# Patient Record
Sex: Male | Born: 1960
Health system: Southern US, Community
[De-identification: ages and names within clinical notes are randomized; demographics above are authoritative.]

## PROBLEM LIST (undated history)

## (undated) DIAGNOSIS — I509 Heart failure, unspecified: Secondary | ICD-10-CM

## (undated) DIAGNOSIS — N400 Enlarged prostate without lower urinary tract symptoms: Secondary | ICD-10-CM

## (undated) DIAGNOSIS — I639 Cerebral infarction, unspecified: Secondary | ICD-10-CM

## (undated) DIAGNOSIS — H3340 Traction detachment of retina, unspecified eye: Secondary | ICD-10-CM

## (undated) DIAGNOSIS — C649 Malignant neoplasm of unspecified kidney, except renal pelvis: Secondary | ICD-10-CM

## (undated) DIAGNOSIS — N184 Chronic kidney disease, stage 4 (severe): Secondary | ICD-10-CM

## (undated) DIAGNOSIS — M810 Age-related osteoporosis without current pathological fracture: Secondary | ICD-10-CM

## (undated) DIAGNOSIS — E119 Type 2 diabetes mellitus without complications: Secondary | ICD-10-CM

## (undated) DIAGNOSIS — G629 Polyneuropathy, unspecified: Secondary | ICD-10-CM

## (undated) DIAGNOSIS — I2510 Atherosclerotic heart disease of native coronary artery without angina pectoris: Secondary | ICD-10-CM

## (undated) DIAGNOSIS — G459 Transient cerebral ischemic attack, unspecified: Secondary | ICD-10-CM

## (undated) DIAGNOSIS — I272 Pulmonary hypertension, unspecified: Secondary | ICD-10-CM

## (undated) DIAGNOSIS — H547 Unspecified visual loss: Secondary | ICD-10-CM

## (undated) DIAGNOSIS — N186 End stage renal disease: Secondary | ICD-10-CM

## (undated) DIAGNOSIS — E785 Hyperlipidemia, unspecified: Secondary | ICD-10-CM

## (undated) DIAGNOSIS — I10 Essential (primary) hypertension: Secondary | ICD-10-CM

## (undated) DIAGNOSIS — D649 Anemia, unspecified: Secondary | ICD-10-CM

## (undated) HISTORY — PX: NEPHRECTOMY RADICAL: SUR878

## (undated) HISTORY — PX: HIP SURGERY: SHX245

## (undated) HISTORY — PX: FRACTURE SURGERY: SHX138

## (undated) HISTORY — PX: AV FISTULA PLACEMENT: SHX1204

## (undated) HISTORY — PX: EYE SURGERY: SHX253

## (undated) HISTORY — PX: CARDIAC CATHETERIZATION: SHX172

---

## 2011-05-28 DIAGNOSIS — M8080XA Other osteoporosis with current pathological fracture, unspecified site, initial encounter for fracture: Secondary | ICD-10-CM | POA: Insufficient documentation

## 2011-07-03 DIAGNOSIS — E1165 Type 2 diabetes mellitus with hyperglycemia: Secondary | ICD-10-CM | POA: Insufficient documentation

## 2011-07-03 DIAGNOSIS — E1129 Type 2 diabetes mellitus with other diabetic kidney complication: Secondary | ICD-10-CM | POA: Insufficient documentation

## 2011-11-30 DIAGNOSIS — E559 Vitamin D deficiency, unspecified: Secondary | ICD-10-CM | POA: Insufficient documentation

## 2012-01-27 DIAGNOSIS — E1142 Type 2 diabetes mellitus with diabetic polyneuropathy: Secondary | ICD-10-CM | POA: Insufficient documentation

## 2012-02-19 DIAGNOSIS — S72001A Fracture of unspecified part of neck of right femur, initial encounter for closed fracture: Secondary | ICD-10-CM | POA: Insufficient documentation

## 2012-07-21 DIAGNOSIS — H4313 Vitreous hemorrhage, bilateral: Secondary | ICD-10-CM | POA: Insufficient documentation

## 2012-09-22 DIAGNOSIS — H3340 Traction detachment of retina, unspecified eye: Secondary | ICD-10-CM | POA: Insufficient documentation

## 2012-10-06 DIAGNOSIS — H1830 Unspecified corneal membrane change: Secondary | ICD-10-CM | POA: Insufficient documentation

## 2012-10-27 ENCOUNTER — Inpatient Hospital Stay: Payer: Self-pay | Admitting: Internal Medicine

## 2012-10-27 LAB — BASIC METABOLIC PANEL
Anion Gap: 9 (ref 7–16)
BUN: 33 mg/dL — ABNORMAL HIGH (ref 7–18)
Chloride: 110 mmol/L — ABNORMAL HIGH (ref 98–107)
Co2: 23 mmol/L (ref 21–32)
Creatinine: 2.04 mg/dL — ABNORMAL HIGH (ref 0.60–1.30)
EGFR (African American): 43 — ABNORMAL LOW
Potassium: 4.9 mmol/L (ref 3.5–5.1)
Sodium: 142 mmol/L (ref 136–145)

## 2012-10-27 LAB — URINALYSIS, COMPLETE
Hyaline Cast: 14
Ketone: NEGATIVE
Nitrite: NEGATIVE
Ph: 5 (ref 4.5–8.0)
Protein: >=500
RBC,UR: 6 /HPF (ref 0–5)

## 2012-10-27 LAB — CBC
HCT: 30.6 % — ABNORMAL LOW (ref 40.0–52.0)
HGB: 10.4 g/dL — ABNORMAL LOW (ref 13.0–18.0)
MCH: 30 pg (ref 26.0–34.0)
MCHC: 34.1 g/dL (ref 32.0–36.0)
MCV: 88 fL (ref 80–100)
RDW: 13.1 % (ref 11.5–14.5)

## 2012-10-27 LAB — CLOSTRIDIUM DIFFICILE BY PCR

## 2012-10-28 LAB — CBC WITH DIFFERENTIAL/PLATELET
Basophil #: 0.1 10*3/uL (ref 0.0–0.1)
Basophil %: 0.8 %
Eosinophil #: 0.3 10*3/uL (ref 0.0–0.7)
HCT: 23.2 % — ABNORMAL LOW (ref 40.0–52.0)
Lymphocyte #: 2.7 10*3/uL (ref 1.0–3.6)
MCH: 29.8 pg (ref 26.0–34.0)
MCHC: 34.1 g/dL (ref 32.0–36.0)
MCV: 87 fL (ref 80–100)
Monocyte #: 0.6 x10 3/mm (ref 0.2–1.0)
Monocyte %: 7.7 %
Platelet: 208 10*3/uL (ref 150–440)
RBC: 2.66 10*6/uL — ABNORMAL LOW (ref 4.40–5.90)
RDW: 12.9 % (ref 11.5–14.5)

## 2012-10-28 LAB — COMPREHENSIVE METABOLIC PANEL
Albumin: 2.1 g/dL — ABNORMAL LOW (ref 3.4–5.0)
Anion Gap: 8 (ref 7–16)
BUN: 27 mg/dL — ABNORMAL HIGH (ref 7–18)
Chloride: 111 mmol/L — ABNORMAL HIGH (ref 98–107)
EGFR (African American): >60
EGFR (Non-African Amer.): 53 — ABNORMAL LOW
Glucose: 151 mg/dL — ABNORMAL HIGH (ref 65–99)
Potassium: 5.2 mmol/L — ABNORMAL HIGH (ref 3.5–5.1)
SGOT(AST): 20 U/L (ref 15–37)
SGPT (ALT): 19 U/L (ref 12–78)
Total Protein: 4.7 g/dL — ABNORMAL LOW (ref 6.4–8.2)

## 2013-01-16 DIAGNOSIS — E11311 Type 2 diabetes mellitus with unspecified diabetic retinopathy with macular edema: Secondary | ICD-10-CM | POA: Insufficient documentation

## 2013-02-28 DIAGNOSIS — R600 Localized edema: Secondary | ICD-10-CM | POA: Insufficient documentation

## 2013-03-13 DIAGNOSIS — I255 Ischemic cardiomyopathy: Secondary | ICD-10-CM | POA: Insufficient documentation

## 2013-03-13 DIAGNOSIS — I272 Pulmonary hypertension, unspecified: Secondary | ICD-10-CM | POA: Insufficient documentation

## 2013-03-13 DIAGNOSIS — I5022 Chronic systolic (congestive) heart failure: Secondary | ICD-10-CM | POA: Insufficient documentation

## 2013-03-27 DIAGNOSIS — K551 Chronic vascular disorders of intestine: Secondary | ICD-10-CM | POA: Insufficient documentation

## 2013-07-21 DIAGNOSIS — D509 Iron deficiency anemia, unspecified: Secondary | ICD-10-CM | POA: Insufficient documentation

## 2013-08-15 DIAGNOSIS — Z85528 Personal history of other malignant neoplasm of kidney: Secondary | ICD-10-CM | POA: Insufficient documentation

## 2013-09-28 DIAGNOSIS — E1139 Type 2 diabetes mellitus with other diabetic ophthalmic complication: Secondary | ICD-10-CM | POA: Insufficient documentation

## 2013-09-28 DIAGNOSIS — E113599 Type 2 diabetes mellitus with proliferative diabetic retinopathy without macular edema, unspecified eye: Secondary | ICD-10-CM | POA: Insufficient documentation

## 2014-06-25 DIAGNOSIS — I6381 Other cerebral infarction due to occlusion or stenosis of small artery: Secondary | ICD-10-CM | POA: Insufficient documentation

## 2014-06-25 DIAGNOSIS — I951 Orthostatic hypotension: Secondary | ICD-10-CM | POA: Insufficient documentation

## 2014-08-25 DIAGNOSIS — I639 Cerebral infarction, unspecified: Secondary | ICD-10-CM | POA: Insufficient documentation

## 2014-12-03 DIAGNOSIS — N184 Chronic kidney disease, stage 4 (severe): Secondary | ICD-10-CM | POA: Insufficient documentation

## 2014-12-04 DIAGNOSIS — D631 Anemia in chronic kidney disease: Secondary | ICD-10-CM | POA: Insufficient documentation

## 2015-03-26 DIAGNOSIS — I160 Hypertensive urgency: Secondary | ICD-10-CM | POA: Insufficient documentation

## 2015-04-09 NOTE — H&P (Signed)
PATIENT NAME:  Richard Zavala, Richard Zavala MR#:  [id number] DATE OF BIRTH:  1961/01/19  DATE OF ADMISSION:  10/27/2012  PRIMARY DOCTOR: [facility]   ER PHYSICIAN: Dr. Lenise Arena   CHIEF COMPLAINT: Diarrhea.   HISTORY OF PRESENT ILLNESS: The patient is a 54 year old male with hypertension and diabetes who was found on the floor by his son this morning. The patient was brought in because of possible syncope. The patient was evaluated by the ER physician and we were asked to admit for acute renal failure with diarrhea. The patient was seen at the bedside, complains of diarrhea since yesterday but afternoon about 10 to 12 loose stools. The patient denies any recent travel history or recent sick contacts. The patient did have some vomiting yesterday afternoon but none after that, mainly loose stools, unable to keep anything down, and has no abdominal pain. The patient denies any fever. The patient felt very weak this morning.   PAST MEDICAL HISTORY:  1. Hypertension.  2. Hyperlipidemia.  3. Diabetes. 4. The patient probably also has peripheral vascular disease.   ALLERGIES: He is allergic to penicillin.   SOCIAL HISTORY: Previous smoker, quit two months ago. Occasional alcohol. No drugs. The patient lives with his son.  PAST SURGICAL HISTORY:  1. Surgery for the right hip. 2. History of right hand surgery. 3. Recent retinal detachment surgery two weeks ago.   FAMILY HISTORY: No hypertension or diabetes.   MEDICATIONS:  1. Aspirin 81 mg daily. 2. Chlorthalidone 25 mg p.o. daily.  3. Plavix 75 mg daily. 4. Flexeril 10 mg p.o. t.i.d.  5. Januvia 100 mg p.o. daily.  6. Lisinopril 5 mg daily.  7. Metformin 750 mg p.o. daily.  8. Niaspan 500 mg at bedtime.  9. Pravastatin 40 mg daily.  10. Promethazine every six hours as needed for nausea.  11. Vitamin B 100 mg p.o. daily.   REVIEW OF SYSTEMS: CONSTITUTIONAL: Feels weak. EYES: Has right eye blurred vision because of recent  surgery. ENT: No tinnitus. No epistaxis. No difficulty swallowing. RESPIRATORY: Has no cough. CARDIOVASCULAR: No chest pain. No palpitations. GI: Had nausea and diarrhea. No abdominal pain. The patient denies any recent history of change in bowel habits. No hemorrhoids. GU: No dysuria. ENDOCRINE: No polyuria or nocturia. The patient does have diabetes but no recent problems. INTEGUMENTARY: No skin rashes. MUSCULOSKELETAL: No joint pain. NEUROLOGIC: The patient denies any dysarthria or headache. PSYCHIATRIC: No anxiety or insomnia.   PHYSICAL EXAMINATION:   VITAL SIGNS: Temperature 97.7, pulse 90, respirations 18, blood pressure 143/78, sats 99% on room air.   GENERAL: He is an alert, awake, oriented, well nourished male answering questions appropriately.   HEENT: Head atraumatic, normocephalic.   EYES:  PERLA ,EOM intact  intact. The patient does have right eye congestion. He says his vision is improving.   ENT: No tympanic membrane congestion. Turbinates show slight right hypertrophy both sides. No oropharyngeal erythema.   NECK: Normal range of motion. No meningeal signs. No JVD. No lymphadenopathy.   CARDIOVASCULAR: S1, S2 regular. No murmurs. PMI not displaced.   LUNGS: Clear to auscultation. Not using accessory muscles of respiration. No wheezing.   ABDOMEN: Soft. Bowel sounds present. No organomegaly. No tenderness. No hernias.   EXTREMITIES: No extremity edema. No cyanosis. No clubbing.   NEUROLOGIC: Alert, awake, oriented. Cranial nerves II through XII intact. Power 5/5 in upper and lower extremities. Sensations are intact. DTRs 2+ bilaterally.   SKIN: Warm and dry.   PSYCH: Mood and  affect are within normal limits.   LAB DATA: Electrolytes sodium 142, potassium 4.9, chloride 110, bicarb 23, BUN 33, creatinine 2, glucose 222, WBC 18.5, hemoglobin 10.4, hematocrit 30.6, platelets 332. Troponin less than 0.02.  Abdominal x-ray shows no evidence of ileus or obstruction.  CAT  scan of the head is done for altered mental status which showed chronic involutional changes without evidence of acute abnormality. Has ganglial finding representing sequela of areas of lacunar infarct.   No previous labs are available for comparison.   EKG shows normal sinus with no ST-T changes.   ASSESSMENT AND PLAN:  1. The patient is 54 year old male with syncope. The patient's syncope is likely due to dehydration from diarrhea and acute renal failure. CT head showed old stroke. The patient is going to be admitted for acute gastroenteritis, most likely viral. Stool for C. diff is already sent. Will follow the stool cultures. Continue IV fluids. White count is elevated but has no evidence of fever and abdomen is not tender so we will hold off on antibiotics at this time, just continue fluids. If the patient does have any abdominal pain or fever, will get CAT scan at that time and then will start antibiotics. Follow the stool cultures.  2. Acute renal failure likely due to dehydration. Continue fluids. Check BMP in the morning.  3. Syncope likely due to diarrhea and acute renal failure. CAT scan showed old strokes. We will obtain MRI of the brain because of his risk factors.  4. Hypertension. Blood pressure is stable. The patient received 2 liters of fluid in the Emergency Room. Continue IV at 100 mL/h. Hold blood pressure medications at this time because blood pressure is borderline. Hold chlorthalidone and lisinopril because of acute renal failure. 5. Diabetes mellitus, type II. Continue sliding scale with coverage. Start on full liquids. Hold metformin due to renal failure.   6. History of peripheral vascular disease. He is on aspirin, Plavix, and statins. Continue them along with Niaspan.   TIME SPENT ON HISTORY AND PHYSICAL: About 55 minutes.   Discussed the plan with the son.   ____________________________ Epifanio Lesches, MD sk:drc D: 10/27/2012 12:31:36 ET T: 10/27/2012 12:48:16  ET JOB#: [id number]  cc: Epifanio Lesches, MD, <Dictator> [place] MD ELECTRONICALLY SIGNED 12/06/2012 14:52

## 2015-04-09 NOTE — Discharge Summary (Signed)
PATIENT NAME:  Richard Zavala, Richard Zavala MR#:  [id number] DATE OF BIRTH:  1961-05-25  DATE OF ADMISSION:  10/27/2012 DATE OF DISCHARGE:  10/28/2012  DISCHARGE DIAGNOSES:  1. Syncope secondary to dehydration.  2. Acute viral gastroenteritis.  3. Acute renal failure due to dehydration, resolving.  4. Hypertension.  5. Diabetes mellitus.  6. Transient ischemic attacks.  7. History of diabetic retinopathy.   DISCHARGE MEDICATIONS:  1. Aspirin 81 mg daily.  2. Plavix 75 mg p.o. daily.  3. Flexeril 10 mg p.o. t.i.d. as needed.  4. Januvia 100 mg daily.  5. Lisinopril 5 mg daily. 6. Niaspan 500 mg at bedtime. 7. Pravastatin 40 mg daily. 8. Promethazine 25 mg p.r.n. for nausea.  9. Vitamin B 100 mg daily.   DO NOT TAKE: The patient is advised to stop metformin today and tomorrow and can be restarted on Sunday. That is on hold because of recovery from renal failure. Advised to stop chlorthalidone.  DISCHARGE VITALS: Temperature 97.1, pulse 78, respirations 20, blood pressure 171/79, saturation 100% on room air.   The patient is supposed to get morning medication.   CONSULTATIONS: None.   HOSPITAL COURSE: The patient is a 54 year old male with hypertension, diabetes, and history of TIA's who came in because of syncope. The patient had diarrhea the day before and was admitted for syncope with acute renal failure. The patient's CT of the head did not show any acute changes. The patient says that he had MRA of the brain at [place] this year in June along with carotid ultrasound and was told that he had mini strokes and also had some carotid stenosis so he is on aspirin and Plavix for that. The patient was started on IV fluids for dehydration. His BUN and creatinine on admission was BUN 33 and creatinine 2. The patient's white count also was elevated at 18.5 but he had no fever so he was not started on antibiotics. He was started on IV fluids. Stool for C. difficile has been negative. The patient did not have  any diarrhea since last night and is able to tolerate the diet. No abdominal pain. No vomiting. The patient's creatinine this morning is 1.52. The patient's white count also has normalized to 7.5. CT head as I mentioned showed chronic changes without evidence of acute abnormalities. The patient's abdominal x-ray showed no evidence of ileus or obstruction. The patient has no evidence of CHF. The patient's condition is stable today. He will be going home after evaluation with physical therapy. The patient uses a cane or walker at home. The patient was advised to continue his diabetic medication except the metformin. His hemoglobin A1c is slightly elevated at 8.3. The patient needs to see his primary doctor at [facility] regarding his diabetes management. Hemoglobin is slightly low at 7.9 but it could be hemodilutional. Denies any diarrhea or blood in the stool.   TIME SPENT ON DISCHARGE PREPARATION: More than 30 minutes.   ____________________________ Epifanio Lesches, MD sk:drc D: 10/28/2012 10:04:10 ET T: 10/29/2012 12:22:38 ET JOB#: [id number]  cc: Epifanio Lesches, MD, <Dictator> Epifanio Lesches MD ELECTRONICALLY SIGNED 11/15/2012 11:56

## 2015-08-14 ENCOUNTER — Inpatient Hospital Stay: Payer: Medicaid Other

## 2015-08-14 ENCOUNTER — Emergency Department: Payer: Medicaid Other

## 2015-08-14 ENCOUNTER — Inpatient Hospital Stay
Admission: EM | Admit: 2015-08-14 | Discharge: 2015-08-17 | DRG: 682 | Disposition: A | Discharge status: Home / Self Care | Payer: Medicaid Other | Attending: Internal Medicine | Admitting: Internal Medicine

## 2015-08-14 ENCOUNTER — Encounter: Payer: Self-pay | Admitting: *Deleted

## 2015-08-14 DIAGNOSIS — N401 Enlarged prostate with lower urinary tract symptoms: Secondary | ICD-10-CM | POA: Diagnosis present

## 2015-08-14 DIAGNOSIS — H540 Blindness, both eyes: Secondary | ICD-10-CM | POA: Diagnosis present

## 2015-08-14 DIAGNOSIS — E1122 Type 2 diabetes mellitus with diabetic chronic kidney disease: Secondary | ICD-10-CM | POA: Diagnosis present

## 2015-08-14 DIAGNOSIS — I5042 Chronic combined systolic (congestive) and diastolic (congestive) heart failure: Secondary | ICD-10-CM | POA: Diagnosis present

## 2015-08-14 DIAGNOSIS — Z8249 Family history of ischemic heart disease and other diseases of the circulatory system: Secondary | ICD-10-CM | POA: Diagnosis not present

## 2015-08-14 DIAGNOSIS — Z825 Family history of asthma and other chronic lower respiratory diseases: Secondary | ICD-10-CM | POA: Diagnosis not present

## 2015-08-14 DIAGNOSIS — R001 Bradycardia, unspecified: Secondary | ICD-10-CM | POA: Diagnosis present

## 2015-08-14 DIAGNOSIS — R0789 Other chest pain: Secondary | ICD-10-CM | POA: Insufficient documentation

## 2015-08-14 DIAGNOSIS — Z880 Allergy status to penicillin: Secondary | ICD-10-CM | POA: Diagnosis not present

## 2015-08-14 DIAGNOSIS — Z85528 Personal history of other malignant neoplasm of kidney: Secondary | ICD-10-CM

## 2015-08-14 DIAGNOSIS — Z992 Dependence on renal dialysis: Secondary | ICD-10-CM | POA: Diagnosis not present

## 2015-08-14 DIAGNOSIS — E11319 Type 2 diabetes mellitus with unspecified diabetic retinopathy without macular edema: Secondary | ICD-10-CM | POA: Diagnosis present

## 2015-08-14 DIAGNOSIS — R197 Diarrhea, unspecified: Secondary | ICD-10-CM | POA: Diagnosis present

## 2015-08-14 DIAGNOSIS — I2510 Atherosclerotic heart disease of native coronary artery without angina pectoris: Secondary | ICD-10-CM | POA: Diagnosis present

## 2015-08-14 DIAGNOSIS — D631 Anemia in chronic kidney disease: Secondary | ICD-10-CM | POA: Diagnosis present

## 2015-08-14 DIAGNOSIS — N2581 Secondary hyperparathyroidism of renal origin: Secondary | ICD-10-CM | POA: Diagnosis present

## 2015-08-14 DIAGNOSIS — M810 Age-related osteoporosis without current pathological fracture: Secondary | ICD-10-CM | POA: Diagnosis present

## 2015-08-14 DIAGNOSIS — Z7982 Long term (current) use of aspirin: Secondary | ICD-10-CM

## 2015-08-14 DIAGNOSIS — R778 Other specified abnormalities of plasma proteins: Secondary | ICD-10-CM | POA: Insufficient documentation

## 2015-08-14 DIAGNOSIS — E785 Hyperlipidemia, unspecified: Secondary | ICD-10-CM | POA: Diagnosis present

## 2015-08-14 DIAGNOSIS — R7989 Other specified abnormal findings of blood chemistry: Secondary | ICD-10-CM | POA: Insufficient documentation

## 2015-08-14 DIAGNOSIS — D6489 Other specified anemias: Secondary | ICD-10-CM | POA: Insufficient documentation

## 2015-08-14 DIAGNOSIS — E875 Hyperkalemia: Secondary | ICD-10-CM | POA: Diagnosis present

## 2015-08-14 DIAGNOSIS — Z794 Long term (current) use of insulin: Secondary | ICD-10-CM

## 2015-08-14 DIAGNOSIS — Z79899 Other long term (current) drug therapy: Secondary | ICD-10-CM

## 2015-08-14 DIAGNOSIS — Z9889 Other specified postprocedural states: Secondary | ICD-10-CM | POA: Diagnosis not present

## 2015-08-14 DIAGNOSIS — E1140 Type 2 diabetes mellitus with diabetic neuropathy, unspecified: Secondary | ICD-10-CM | POA: Diagnosis present

## 2015-08-14 DIAGNOSIS — I120 Hypertensive chronic kidney disease with stage 5 chronic kidney disease or end stage renal disease: Secondary | ICD-10-CM | POA: Diagnosis present

## 2015-08-14 DIAGNOSIS — R52 Pain, unspecified: Secondary | ICD-10-CM | POA: Insufficient documentation

## 2015-08-14 DIAGNOSIS — Z833 Family history of diabetes mellitus: Secondary | ICD-10-CM

## 2015-08-14 DIAGNOSIS — E1121 Type 2 diabetes mellitus with diabetic nephropathy: Secondary | ICD-10-CM | POA: Diagnosis present

## 2015-08-14 DIAGNOSIS — Z87891 Personal history of nicotine dependence: Secondary | ICD-10-CM

## 2015-08-14 DIAGNOSIS — D638 Anemia in other chronic diseases classified elsewhere: Secondary | ICD-10-CM | POA: Diagnosis present

## 2015-08-14 DIAGNOSIS — Z905 Acquired absence of kidney: Secondary | ICD-10-CM | POA: Diagnosis present

## 2015-08-14 DIAGNOSIS — I248 Other forms of acute ischemic heart disease: Secondary | ICD-10-CM | POA: Diagnosis present

## 2015-08-14 DIAGNOSIS — Z888 Allergy status to other drugs, medicaments and biological substances status: Secondary | ICD-10-CM

## 2015-08-14 DIAGNOSIS — N186 End stage renal disease: Secondary | ICD-10-CM | POA: Diagnosis present

## 2015-08-14 DIAGNOSIS — I313 Pericardial effusion (noninflammatory): Secondary | ICD-10-CM | POA: Diagnosis not present

## 2015-08-14 DIAGNOSIS — E11649 Type 2 diabetes mellitus with hypoglycemia without coma: Secondary | ICD-10-CM | POA: Diagnosis present

## 2015-08-14 DIAGNOSIS — Z8673 Personal history of transient ischemic attack (TIA), and cerebral infarction without residual deficits: Secondary | ICD-10-CM

## 2015-08-14 DIAGNOSIS — R531 Weakness: Secondary | ICD-10-CM | POA: Insufficient documentation

## 2015-08-14 DIAGNOSIS — E162 Hypoglycemia, unspecified: Secondary | ICD-10-CM | POA: Insufficient documentation

## 2015-08-14 DIAGNOSIS — I255 Ischemic cardiomyopathy: Secondary | ICD-10-CM | POA: Diagnosis present

## 2015-08-14 DIAGNOSIS — N19 Unspecified kidney failure: Secondary | ICD-10-CM

## 2015-08-14 HISTORY — DX: Atherosclerotic heart disease of native coronary artery without angina pectoris: I25.10

## 2015-08-14 HISTORY — DX: Age-related osteoporosis without current pathological fracture: M81.0

## 2015-08-14 HISTORY — DX: Unspecified visual loss: H54.7

## 2015-08-14 HISTORY — DX: Pulmonary hypertension, unspecified: I27.20

## 2015-08-14 HISTORY — DX: Malignant neoplasm of unspecified kidney, except renal pelvis: C64.9

## 2015-08-14 HISTORY — DX: Anemia, unspecified: D64.9

## 2015-08-14 HISTORY — DX: Transient cerebral ischemic attack, unspecified: G45.9

## 2015-08-14 HISTORY — DX: Traction detachment of retina, unspecified eye: H33.40

## 2015-08-14 HISTORY — DX: Chronic kidney disease, stage 4 (severe): N18.4

## 2015-08-14 HISTORY — DX: Hyperlipidemia, unspecified: E78.5

## 2015-08-14 HISTORY — DX: Essential (primary) hypertension: I10

## 2015-08-14 HISTORY — DX: Cerebral infarction, unspecified: I63.9

## 2015-08-14 HISTORY — DX: Type 2 diabetes mellitus without complications: E11.9

## 2015-08-14 HISTORY — DX: Heart failure, unspecified: I50.9

## 2015-08-14 LAB — HEMOGLOBIN AND HEMATOCRIT, BLOOD
HCT: 20.7 % — ABNORMAL LOW (ref 40.0–52.0)
HCT: 23.2 % — ABNORMAL LOW (ref 40.0–52.0)
Hemoglobin: 6.3 g/dL — ABNORMAL LOW (ref 13.0–18.0)
Hemoglobin: 7.4 g/dL — ABNORMAL LOW (ref 13.0–18.0)

## 2015-08-14 LAB — IRON AND TIBC
IRON: 58 ug/dL (ref 45–182)
Saturation Ratios: 25 % (ref 17.9–39.5)
TIBC: 232 ug/dL — AB (ref 250–450)
UIBC: 174 ug/dL

## 2015-08-14 LAB — TRANSFERRIN: TRANSFERRIN: 166 mg/dL — AB (ref 180–329)

## 2015-08-14 LAB — COMPREHENSIVE METABOLIC PANEL
ALK PHOS: 74 U/L (ref 38–126)
ALT: 16 U/L — ABNORMAL LOW (ref 17–63)
ANION GAP: 9 (ref 5–15)
AST: 19 U/L (ref 15–41)
Albumin: 3.1 g/dL — ABNORMAL LOW (ref 3.5–5.0)
BILIRUBIN TOTAL: 0.4 mg/dL (ref 0.3–1.2)
BUN: 63 mg/dL — ABNORMAL HIGH (ref 6–20)
CALCIUM: 7.4 mg/dL — AB (ref 8.9–10.3)
CO2: 10 mmol/L — ABNORMAL LOW (ref 22–32)
Chloride: 114 mmol/L — ABNORMAL HIGH (ref 101–111)
Creatinine, Ser: 10.34 mg/dL — ABNORMAL HIGH (ref 0.61–1.24)
GFR calc non Af Amer: 5 mL/min — ABNORMAL LOW (ref >60)
GFR, EST AFRICAN AMERICAN: 6 mL/min — AB (ref >60)
Glucose, Bld: 54 mg/dL — ABNORMAL LOW (ref 65–99)
POTASSIUM: 6.1 mmol/L — AB (ref 3.5–5.1)
SODIUM: 133 mmol/L — AB (ref 135–145)
TOTAL PROTEIN: 6.1 g/dL — AB (ref 6.5–8.1)

## 2015-08-14 LAB — GLUCOSE, CAPILLARY
GLUCOSE-CAPILLARY: 45 mg/dL — AB (ref 65–99)
GLUCOSE-CAPILLARY: 25 mg/dL — AB (ref 65–99)
GLUCOSE-CAPILLARY: 292 mg/dL — AB (ref 65–99)
GLUCOSE-CAPILLARY: 105 mg/dL — AB (ref 65–99)
GLUCOSE-CAPILLARY: 99 mg/dL (ref 65–99)
GLUCOSE-CAPILLARY: 122 mg/dL — AB (ref 65–99)
Glucose-Capillary: 174 mg/dL — ABNORMAL HIGH (ref 65–99)

## 2015-08-14 LAB — CBC
HEMATOCRIT: 21.3 % — AB (ref 40.0–52.0)
HEMOGLOBIN: 6.5 g/dL — AB (ref 13.0–18.0)
MCH: 25 pg — ABNORMAL LOW (ref 26.0–34.0)
MCHC: 30.3 g/dL — ABNORMAL LOW (ref 32.0–36.0)
MCV: 82.5 fL (ref 80.0–100.0)
Platelets: 141 10*3/uL — ABNORMAL LOW (ref 150–440)
RBC: 2.58 MIL/uL — AB (ref 4.40–5.90)
RDW: 17.9 % — ABNORMAL HIGH (ref 11.5–14.5)
WBC: 6.6 10*3/uL (ref 3.8–10.6)

## 2015-08-14 LAB — FERRITIN: Ferritin: 59 ng/mL (ref 24–336)

## 2015-08-14 LAB — TROPONIN I: TROPONIN I: 0.05 ng/mL — AB (ref <0.031)

## 2015-08-14 LAB — LIPASE, BLOOD: Lipase: 23 U/L (ref 22–51)

## 2015-08-14 LAB — PREPARE RBC (CROSSMATCH)

## 2015-08-14 LAB — POTASSIUM
Potassium: 6.1 mmol/L — ABNORMAL HIGH (ref 3.5–5.1)
Potassium: 3.9 mmol/L (ref 3.5–5.1)

## 2015-08-14 LAB — ABO/RH: ABO/RH(D): O POS

## 2015-08-14 LAB — MRSA PCR SCREENING: MRSA by PCR: NEGATIVE

## 2015-08-14 MED ORDER — LORAZEPAM 2 MG/ML IJ SOLN
0.5000 mg | Freq: Once | INTRAMUSCULAR | Status: AC
  Administered 2015-08-14: 0.5 mg via INTRAVENOUS

## 2015-08-14 MED ORDER — ONDANSETRON HCL 4 MG PO TABS: 4.0000 mg | ORAL_TABLET | Freq: Four times a day (QID) | ORAL | Status: DC | PRN

## 2015-08-14 MED ORDER — VITAMIN D 1000 UNITS PO TABS
2000.0000 [IU] | ORAL_TABLET | Freq: Every day | ORAL | Status: DC
  Administered 2015-08-15 – 2015-08-17 (×2): 2000 [IU] via ORAL
  Filled 2015-08-14 (×2): qty 2

## 2015-08-14 MED ORDER — SODIUM CHLORIDE 0.9 % IJ SOLN
3.0000 mL | Freq: Two times a day (BID) | INTRAMUSCULAR | Status: DC
Start: 2015-08-14 — End: 2015-08-17
  Administered 2015-08-14 – 2015-08-17 (×6): 3 mL via INTRAVENOUS

## 2015-08-14 MED ORDER — HALOPERIDOL LACTATE 5 MG/ML IJ SOLN
1.0000 mg | Freq: Once | INTRAMUSCULAR | Status: DC
  Filled 2015-08-14: qty 1

## 2015-08-14 MED ORDER — TAMSULOSIN HCL 0.4 MG PO CAPS
0.4000 mg | ORAL_CAPSULE | Freq: Every day | ORAL | Status: DC
  Administered 2015-08-15 – 2015-08-17 (×2): 0.4 mg via ORAL
  Filled 2015-08-14 (×2): qty 1

## 2015-08-14 MED ORDER — SODIUM POLYSTYRENE SULFONATE 15 GM/60ML PO SUSP
15.0000 g | Freq: Once | ORAL | Status: AC
Start: 2015-08-14 — End: 2015-08-14
  Administered 2015-08-14: 15 g via ORAL
  Filled 2015-08-14: qty 60

## 2015-08-14 MED ORDER — SENNOSIDES-DOCUSATE SODIUM 8.6-50 MG PO TABS: 1.0000 | ORAL_TABLET | Freq: Every evening | ORAL | Status: DC | PRN

## 2015-08-14 MED ORDER — SODIUM CHLORIDE 0.9 % IV SOLN: Freq: Once | INTRAVENOUS | Status: DC

## 2015-08-14 MED ORDER — LORAZEPAM 2 MG/ML IJ SOLN
INTRAMUSCULAR | Status: AC
  Administered 2015-08-14: 0.5 mg
  Filled 2015-08-14: qty 1

## 2015-08-14 MED ORDER — ACETAMINOPHEN 650 MG RE SUPP: 650.0000 mg | Freq: Four times a day (QID) | RECTAL | Status: DC | PRN

## 2015-08-14 MED ORDER — DEXTROSE 50 % IV SOLN
2.0000 | Freq: Once | INTRAVENOUS | Status: AC
  Administered 2015-08-14: 100 mL via INTRAVENOUS

## 2015-08-14 MED ORDER — DEXTROSE 50 % IV SOLN
INTRAVENOUS | Status: AC
  Filled 2015-08-14: qty 50

## 2015-08-14 MED ORDER — INSULIN ASPART 100 UNIT/ML ~~LOC~~ SOLN
0.0000 [IU] | Freq: Three times a day (TID) | SUBCUTANEOUS | Status: DC
  Administered 2015-08-15: 3 [IU] via SUBCUTANEOUS
  Administered 2015-08-16 – 2015-08-17 (×4): 2 [IU] via SUBCUTANEOUS
  Filled 2015-08-14 (×6): qty 2
  Filled 2015-08-14: qty 3

## 2015-08-14 MED ORDER — HYDROCHLOROTHIAZIDE 25 MG PO TABS
25.0000 mg | ORAL_TABLET | Freq: Two times a day (BID) | ORAL | Status: DC
  Administered 2015-08-14 – 2015-08-16 (×4): 25 mg via ORAL
  Filled 2015-08-14 (×4): qty 1

## 2015-08-14 MED ORDER — ATORVASTATIN CALCIUM 20 MG PO TABS
80.0000 mg | ORAL_TABLET | Freq: Every day | ORAL | Status: DC
  Administered 2015-08-15 – 2015-08-17 (×3): 80 mg via ORAL
  Filled 2015-08-14 (×3): qty 4

## 2015-08-14 MED ORDER — LORAZEPAM 2 MG/ML IJ SOLN
2.0000 mg | Freq: Once | INTRAMUSCULAR | Status: AC
Start: 2015-08-14 — End: 2015-08-14
  Administered 2015-08-14: 2 mg via INTRAVENOUS
  Filled 2015-08-14: qty 1

## 2015-08-14 MED ORDER — SODIUM BICARBONATE 8.4 % IV SOLN
50.0000 meq | Freq: Once | INTRAVENOUS | Status: AC
  Administered 2015-08-14: 50 meq via INTRAVENOUS
  Filled 2015-08-14: qty 50

## 2015-08-14 MED ORDER — CALCIUM ACETATE (PHOS BINDER) 667 MG PO CAPS
1334.0000 mg | ORAL_CAPSULE | Freq: Three times a day (TID) | ORAL | Status: DC
Start: 2015-08-14 — End: 2015-08-17
  Administered 2015-08-14 – 2015-08-17 (×6): 1334 mg via ORAL
  Filled 2015-08-14 (×6): qty 2

## 2015-08-14 MED ORDER — CALCITRIOL 0.25 MCG PO CAPS
0.2500 ug | ORAL_CAPSULE | Freq: Every day | ORAL | Status: DC
  Administered 2015-08-15 – 2015-08-17 (×2): 0.25 ug via ORAL
  Filled 2015-08-14 (×2): qty 1

## 2015-08-14 MED ORDER — FERROUS SULFATE 325 (65 FE) MG PO TABS
325.0000 mg | ORAL_TABLET | Freq: Every morning | ORAL | Status: DC
  Administered 2015-08-15 – 2015-08-17 (×2): 325 mg via ORAL
  Filled 2015-08-14 (×2): qty 1

## 2015-08-14 MED ORDER — SODIUM CHLORIDE 0.9 % IJ SOLN
3.0000 mL | INTRAMUSCULAR | Status: DC | PRN
  Administered 2015-08-15: 3 mL via INTRAVENOUS
  Filled 2015-08-14: qty 10

## 2015-08-14 MED ORDER — DEXTROSE 50 % IV SOLN
25.0000 g | Freq: Once | INTRAVENOUS | Status: AC
  Administered 2015-08-14: 25 g via INTRAVENOUS
  Filled 2015-08-14: qty 50

## 2015-08-14 MED ORDER — ONDANSETRON HCL 4 MG/2ML IJ SOLN
4.0000 mg | Freq: Four times a day (QID) | INTRAMUSCULAR | Status: DC | PRN
  Administered 2015-08-15: 4 mg via INTRAVENOUS
  Filled 2015-08-14: qty 2

## 2015-08-14 MED ORDER — CHOLECALCIFEROL 50 MCG (2000 UT) PO TABS: ORAL_TABLET | Freq: Every day | ORAL | Status: DC

## 2015-08-14 MED ORDER — INSULIN ASPART 100 UNIT/ML ~~LOC~~ SOLN
10.0000 [IU] | Freq: Once | SUBCUTANEOUS | Status: AC
  Administered 2015-08-14: 10 [IU] via INTRAVENOUS
  Filled 2015-08-14: qty 10

## 2015-08-14 MED ORDER — ASPIRIN EC 81 MG PO TBEC
81.0000 mg | DELAYED_RELEASE_TABLET | Freq: Every day | ORAL | Status: DC
  Administered 2015-08-15 – 2015-08-17 (×2): 81 mg via ORAL
  Filled 2015-08-14 (×2): qty 1

## 2015-08-14 MED ORDER — OXYCODONE-ACETAMINOPHEN 5-325 MG PO TABS
1.0000 | ORAL_TABLET | Freq: Four times a day (QID) | ORAL | Status: DC | PRN
  Administered 2015-08-14: 1 via ORAL
  Filled 2015-08-14: qty 1

## 2015-08-14 MED ORDER — FUROSEMIDE 40 MG PO TABS
40.0000 mg | ORAL_TABLET | Freq: Every day | ORAL | Status: DC
  Administered 2015-08-15 – 2015-08-17 (×2): 40 mg via ORAL
  Filled 2015-08-14 (×2): qty 1

## 2015-08-14 MED ORDER — SODIUM CHLORIDE 0.9 % IV SOLN
1.0000 g | Freq: Once | INTRAVENOUS | Status: AC
  Administered 2015-08-14: 1 g via INTRAVENOUS
  Filled 2015-08-14: qty 10

## 2015-08-14 MED ORDER — ACETAMINOPHEN 325 MG PO TABS: 650.0000 mg | ORAL_TABLET | Freq: Four times a day (QID) | ORAL | Status: DC | PRN

## 2015-08-14 MED ORDER — SODIUM CHLORIDE 0.9 % IV SOLN: 250.0000 mL | INTRAVENOUS | Status: DC | PRN

## 2015-08-14 NOTE — Consult Note (Signed)
Date: 08/14/2015                  Patient Name:  Richard Zavala  MRN: [id number]  DOB: December 27, 1960  Age / Sex: 54 y.o., male         PCP: Midge Minium, PA                 Service Requesting Consult: Internal medicine                 Reason for Consult: CKD 5, Hyperkalemia            History of Present Illness: Patient is a 54 y.o. male with medical problems of long-standing diabetes with complications of neuropathy, retinopathy coronary disease with history of heart catheterization in the past, BPH, left nephrectomy in 1971 due to Wilms tumor, pulmonary hypertension, congestive heart failure, anemia, hypertension who was admitted to [facility] on 08/14/2015 for evaluation of generalized weakness, fatigue, nausea, poor appetite.  Patient states that he follows up with [place] [facility] for chronic kidney disease. He thinks his original disease is diabetic nephropathy. He has a AV fistula in his left forearm that was placed in March of this year. In the ER, his labs were noted to be significantly abnormal. Creatinine is 10.3, BUN of 63, potassium of 6.1. Last known previous creatinine was 6.4 on July 25 in [facility] EMR  Medications: Outpatient medications: Prescriptions prior to admission  Medication Sig Dispense Refill Last Dose  . aspirin EC 81 MG tablet Take 81 mg by mouth daily.   UNKNOWNN at UNKNOWN  . atorvastatin (LIPITOR) 80 MG tablet Take 80 mg by mouth daily.   UNKNOWN at UNKNOWN  . calcitRIOL (ROCALTROL) 0.25 MCG capsule Take 1 capsule by mouth daily.   UNKNOWN at UNKNOWN  . calcium acetate (PHOSLO) 667 MG capsule Take 2 capsules by mouth 3 (three) times daily.   UNKNOWN at UNKNOWN  . carvedilol (COREG) 12.5 MG tablet Take 37.5 mg by mouth 2 (two) times daily.   UNKNOWN at UNKNOWN  . Cholecalciferol (D 2000) 2000 UNITS TABS Take 1,000 mg by mouth daily.   UNKNOWN at UNKNOWN  . ferrous sulfate 325 (65 FE) MG tablet Take 325 mg by mouth every morning.   UNKNOWN at UNKNOWN  .  furosemide (LASIX) 40 MG tablet Take 40 mg by mouth daily.   UNKNWON at UNKNOWN  . hydrochlorothiazide (HYDRODIURIL) 25 MG tablet Take 25 mg by mouth 2 (two) times daily.   UNKNOWN at [facility]  . insulin glargine (LANTUS) 100 UNIT/ML injection Inject 17 Units into the skin at bedtime.   UKNOWN at UNKNOWN  . tamsulosin (FLOMAX) 0.4 MG CAPS capsule Take 1 capsule by mouth daily.   UNKNOWN at [facility]    Current medications: Current Facility-Administered Medications  Medication Dose Route Frequency Provider Last Rate Last Dose  . 0.9 %  sodium chloride infusion  250 mL Intravenous PRN Epifanio Lesches, MD      . 0.9 %  sodium chloride infusion   Intravenous Once Epifanio Lesches, MD      . 0.9 %  sodium chloride infusion   Intravenous Once Epifanio Lesches, MD      . acetaminophen (TYLENOL) tablet 650 mg  650 mg Oral Q6H PRN Epifanio Lesches, MD       Or  . acetaminophen (TYLENOL) suppository 650 mg  650 mg Rectal Q6H PRN Epifanio Lesches, MD      . aspirin EC tablet 81 mg  81  mg Oral Daily Epifanio Lesches, MD      . atorvastatin (LIPITOR) tablet 80 mg  80 mg Oral Daily Epifanio Lesches, MD      . calcitRIOL (ROCALTROL) capsule 0.25 mcg  0.25 mcg Oral Daily Epifanio Lesches, MD      . calcium acetate (PHOSLO) capsule 1,334 mg  1,334 mg Oral TID Epifanio Lesches, MD      . cholecalciferol (VITAMIN D) tablet 2,000 Units  2,000 Units Oral Daily Epifanio Lesches, MD      . dextrose 50 % solution           . ferrous sulfate tablet 325 mg  325 mg Oral q morning - 10a Epifanio Lesches, MD      . furosemide (LASIX) tablet 40 mg  40 mg Oral Daily Epifanio Lesches, MD      . hydrochlorothiazide (HYDRODIURIL) tablet 25 mg  25 mg Oral BID Epifanio Lesches, MD      . insulin aspart (novoLOG) injection 0-9 Units  0-9 Units Subcutaneous TID WC Epifanio Lesches, MD      . ondansetron (ZOFRAN) tablet 4 mg  4 mg Oral Q6H PRN Epifanio Lesches, MD       Or  .  ondansetron (ZOFRAN) injection 4 mg  4 mg Intravenous Q6H PRN Epifanio Lesches, MD      . senna-docusate (Senokot-S) tablet 1 tablet  1 tablet Oral QHS PRN Epifanio Lesches, MD      . sodium chloride 0.9 % injection 3 mL  3 mL Intravenous Q12H Epifanio Lesches, MD      . sodium chloride 0.9 % injection 3 mL  3 mL Intravenous PRN Epifanio Lesches, MD      . tamsulosin (FLOMAX) capsule 0.4 mg  0.4 mg Oral Daily Epifanio Lesches, MD          Allergies: Allergies  Allergen Reactions  . Metformin Other (See Comments)    Severe diarrhea  . Penicillins Hives      Past Medical History: Past Medical History  Diagnosis Date  . Diabetes mellitus without complication   . Hypertension   . Chronic kidney disease (CKD), stage IV (severe)   . CHF (congestive heart failure)   . Hyperlipemia   . Osteoporosis   . Anemia   . TIA (transient ischemic attack)   . Stroke   . Wilms' tumor   . CAD (coronary artery disease)   . Pulmonary HTN   . TRD (traction retinal detachment)   . Blind      Past Surgical History: Past Surgical History  Procedure Laterality Date  . Nephrectomy radical    . Eye surgery    . Hip surgery    . Av fistula placement       Family History: No family history on file.   Social History: Social History   Social History  . Marital Status: Married    Spouse Name: N/A  . Number of Children: N/A  . Years of Education: N/A   Occupational History  . Not on file.   Social History Main Topics  . Smoking status: Former Research scientist (life sciences)  . Smokeless tobacco: Not on file  . Alcohol Use: Not on file  . Drug Use: Not on file  . Sexual Activity: Not on file   Other Topics Concern  . Not on file   Social History Narrative  . No narrative on file     Review of Systems: Gen: No fevers, chills, reports generalized weakness HEENT: Severe diabetic retinopathy, left corneal  opacity, left eye is blind, poor vision CV: Denies dyspnea on exertion, chest pain  at this time Resp: Denies cough, sputum, blood and sputum at this time GI: Poor appetite, no nausea or vomiting reported GU : Reports difficulty with voiding, takes medications for BPH MS: Small muscle wasting of the hands, generalized weakness Derm:  No acute rashes reported Psych: No problems reported Heme: Severe anemia, he was getting Procrit at [facility]:  diabetic neuropathy  Endocrine: Last A1c 8.2% in November 2015  Vital Signs: Blood pressure 119/47, pulse 63, temperature 97.4 F (36.3 C), temperature source Oral, resp. rate 20, height 5\' 8"  (1.727 m), weight 74.844 kg (165 lb), SpO2 98 %.  No intake or output data in the 24 hours ending 08/14/15 0955  Weight trends: Filed Weights   08/14/15 0330  Weight: 74.844 kg (165 lb)    Physical Exam: General:   chronically ill-appearing gentleman, no acute distress   HEENT  Left corneal opacity, right pupillary dysformation, poor vision   Neck:   supple, no masses, left carotid bruit  Lungs:  scattered rhonchi, normal effort,  Heart::   regular rhythm, no rub or gallop   Abdomen:  soft, nontender   Extremities:   no peripheral edema   Neurologic:  Evidence of wasting of small muscles of hand   Skin:  no acute rashes   Access:  left forearm AV fistula  Foley: no       Lab results: Basic Metabolic Panel:  Recent Labs Lab 08/14/15 0347  NA 133*  K 6.1*  CL 114*  CO2 10*  GLUCOSE 54*  BUN 63*  CREATININE 10.34*  CALCIUM 7.4*    Liver Function Tests:  Recent Labs Lab 08/14/15 0347  AST 19  ALT 16*  ALKPHOS 74  BILITOT 0.4  PROT 6.1*  ALBUMIN 3.1*    Recent Labs Lab 08/14/15 0347  LIPASE 23   No results for input(s): AMMONIA in the last 168 hours.  CBC:  Recent Labs Lab 08/14/15 0347  WBC 6.6  HGB 6.5*  HCT 21.3*  MCV 82.5  PLT 141*    Cardiac Enzymes:  Recent Labs Lab 08/14/15 0347  TROPONINI 0.05*    BNP: Invalid input(s): POCBNP  CBG:  Recent Labs Lab 08/14/15 0625   GLUCAP 45*    Microbiology: No results found for this or any previous visit (from the past 720 hour(s)).   Coagulation Studies: No results for input(s): LABPROT, INR in the last 72 hours.  Urinalysis: No results for input(s): COLORURINE, LABSPEC, PHURINE, GLUCOSEU, HGBUR, BILIRUBINUR, KETONESUR, PROTEINUR, UROBILINOGEN, NITRITE, LEUKOCYTESUR in the last 72 hours.  Invalid input(s): APPERANCEUR    Imaging: Dg Chest 2 View  08/14/2015   CLINICAL DATA:  Rib pain and weakness. Diarrhea for a month. History of diabetes, congestive heart failure, and renal failure.  EXAM: CHEST  2 VIEW  COMPARISON:  None.  FINDINGS: Borderline heart size and pulmonary vascularity. No edema or consolidation in the lungs. No blunting of costophrenic angles. No pneumothorax. Mediastinal contours appear intact. Degenerative changes in the spine and shoulders.  IMPRESSION: Borderline heart size and pulmonary vascularity. No edema or consolidation.   Electronically Signed   By: Lucienne Capers M.D.   On: 08/14/2015 03:59      Assessment & Plan: Pt is a 54 y.o. yo male with  medical problems of long-standing diabetes with complications of neuropathy, retinopathy coronary disease with history of heart catheterization in the past, BPH, left nephrectomy in 1971 due to  Wilms tumor, pulmonary hypertension, congestive heart failure, anemia, hypertension , was admitted on 08/14/2015 with fatigue, poor appetite, worsening of renal failure and severe anemia.  1. End-stage renal disease. Likely secondary to diabetic nephropathy - We'll get him started on dialysis via his left forearm AV fistula - If AVF is not able to be used successfully, we will obtain PermCath - Start discharge planning  2. Anemia of chronic kidney disease - Obtain iron studies. We'll start Procrit if iron studies are adequate  3. Secondary hyperparathyroidism - Dietary counseling - Continue PhosLo 4. Diabetes type 2 with CKD 5. Hyperkalemia -  Expected to improve with dialysis

## 2015-08-14 NOTE — Progress Notes (Signed)
Patient has not voided since admission to 2A 236. RN attempt to help patient void using urinal, but no success. Pt has the urge to go but could not get stream started. RN bladder scanned and showed 543ml in bladder. Dr. Vianne Bulls paged, said to defer to nephrology. Dr. Candiss Norse paged and RN received orders for in/out catheter and to re-bladder scan tomorrow AM.

## 2015-08-14 NOTE — H&P (Signed)
[facility] [place] NAME: Richard Zavala    MR#:  [id number]  DATE OF BIRTH:  07/07/1961  DATE OF ADMISSION:  08/14/2015  PRIMARY CARE PHYSICIAN: Midge Minium, PA   REQUESTING/REFERRING PHYSICIAN: [facility]  CHIEF COMPLAINT: Generalized weakness, nausea, body pains.    Chief Complaint  Patient presents with  . Diarrhea  . Rib Injury  . Nasal Congestion    HISTORY OF PRESENT ILLNESS:  Richard Zavala  is a 54 y.o. male with a known history of HTN,DMII, ckd  stage IV approaching hemodialysis comes in because of multiple complaints, stating that he feels weak .generalized body pains. He also complains of fatigue for the past 2 days. Has nausea and decreased by mouth intake for 2 days. No fever, no chest pain, no shortness of breath. No abdominal pain. Known right diarrhea. Patient potassium was more than 6 in the emergency room. Patient says that he had pineapple last night. He says that he follows up at [facility] for his nephrology needs and he has AV fistula placed in March of this year. He says that he gets the shots for his anemia every 2 weeks. He says that he fell out of the bed on Saturday and the hit the pole on the side of the chest.  PAST MEDICAL HISTORY:   Past Medical History  Diagnosis Date  . Diabetes mellitus without complication   . Hypertension   . Chronic kidney disease (CKD), stage IV (severe)   . CHF (congestive heart failure)   . Hyperlipemia   . Osteoporosis   . Anemia   . TIA (transient ischemic attack)   . Stroke   . Wilms' tumor   . CAD (coronary artery disease)   . Pulmonary HTN   . TRD (traction retinal detachment)   . Blind     PAST SURGICAL HISTOIRY:   Past Surgical History  Procedure Laterality Date  . Nephrectomy radical    . Eye surgery    . Hip surgery    . Av fistula placement      SOCIAL HISTORY:   Social History  Substance Use Topics  . Smoking status:  Former Research scientist (life sciences)  . Smokeless tobacco: Not on file  . Alcohol Use: Not on file    FAMILY HISTORY:  No family history on file.  DRUG ALLERGIES:   Allergies  Allergen Reactions  . Metformin Other (See Comments)    Severe diarrhea  . Penicillins Hives    REVIEW OF SYSTEMS:  CONSTITUTIONAL: FATIGUE. EYES: No blurred or double vision. Blind in the left eye EARS, NOSE, AND THROAT: No tinnitus or ear pain.  RESPIRATORY: No cough, shortness of breath, wheezing or hemoptysis.  CARDIOVASCULAR: No chest pain, orthopnea, edema.  GASTROINTESTINAL: NAUSEA.POOR PO INTAKE.Marland Kitchen  GENITOURINARY: No dysuria, hematuria.  ENDOCRINE: No polyuria, nocturia,  HEMATOLOGY: ANEMIC. SKIN: No rash or lesion. MUSCULOSKELETAL: No joint pain or arthritis.   NEUROLOGIC: No tingling, numbness, weakness.  PSYCHIATRY: No anxiety or depression.   MEDICATIONS AT HOME:   Prior to Admission medications   Medication Sig Start Date End Date Taking? Authorizing Provider  aspirin EC 81 MG tablet Take 81 mg by mouth daily.   Yes Historical Provider, MD  atorvastatin (LIPITOR) 80 MG tablet Take 80 mg by mouth daily.   Yes Historical Provider, MD  calcitRIOL (ROCALTROL) 0.25 MCG capsule Take 1 capsule by mouth daily. 04/03/15 04/02/16 Yes Historical Provider, MD  calcium acetate (PHOSLO) 667 MG  capsule Take 2 capsules by mouth 3 (three) times daily. 07/01/15 06/30/16 Yes Historical Provider, MD  carvedilol (COREG) 12.5 MG tablet Take 37.5 mg by mouth 2 (two) times daily.   Yes Historical Provider, MD  Cholecalciferol (D 2000) 2000 UNITS TABS Take 1,000 mg by mouth daily.   Yes Historical Provider, MD  ferrous sulfate 325 (65 FE) MG tablet Take 325 mg by mouth every morning.   Yes Historical Provider, MD  furosemide (LASIX) 40 MG tablet Take 40 mg by mouth daily.   Yes Historical Provider, MD  hydrochlorothiazide (HYDRODIURIL) 25 MG tablet Take 25 mg by mouth 2 (two) times daily.   Yes Historical Provider, MD  insulin glargine  (LANTUS) 100 UNIT/ML injection Inject 17 Units into the skin at bedtime.   Yes Historical Provider, MD  tamsulosin (FLOMAX) 0.4 MG CAPS capsule Take 1 capsule by mouth daily. 07/01/15 06/30/16 Yes Historical Provider, MD      VITAL SIGNS:  Blood pressure 116/60, pulse 64, temperature 97.5 F (36.4 C), temperature source Oral, resp. rate 18, height 5\' 8"  (1.727 m), weight 74.844 kg (165 lb), SpO2 100 %.  PHYSICAL EXAMINATION:  GENERAL:  54 y.o.-year-old patient lying in the bed with no acute distress.  EYES: Pupils equal, round, reactive to light and accommodation. No scleral icterus. Extraocular muscles intact.  HEENT: Head atraumatic, normocephalic. Oropharynx and nasopharynx clear.  NECK:  Supple, no jugular venous distention. No thyroid enlargement, no tenderness.  LUNGS: Normal breath sounds bilaterally, no wheezing, rales,rhonchi or crepitation. No use of accessory muscles of respiration.  CARDIOVASCULAR: S1, S2 normal. No murmurs, rubs, or gallops.  ABDOMEN: Soft, nontender, nondistended. Bowel sounds present. No organomegaly or mass.  EXTREMITIES: No pedal edema, cyanosis, or clubbing.  NEUROLOGIC: Cranial nerves II through XII are intact. Muscle strength 5/5 in all extremities. Sensation intact. Gait not checked.  PSYCHIATRIC: The patient is alert and oriented x 3.  SKIN: No obvious rash, lesion, or ulcer.   LABORATORY PANEL:   CBC  Recent Labs Lab 08/14/15 0347  WBC 6.6  HGB 6.5*  HCT 21.3*  PLT 141*   ------------------------------------------------------------------------------------------------------------------  Chemistries   Recent Labs Lab 08/14/15 0347  NA 133*  K 6.1*  CL 114*  CO2 10*  GLUCOSE 54*  BUN 63*  CREATININE 10.34*  CALCIUM 7.4*  AST 19  ALT 16*  ALKPHOS 74  BILITOT 0.4   ------------------------------------------------------------------------------------------------------------------  Cardiac Enzymes  Recent Labs Lab 08/14/15 0347   TROPONINI 0.05*   ------------------------------------------------------------------------------------------------------------------  RADIOLOGY:  Dg Chest 2 View  08/14/2015   CLINICAL DATA:  Rib pain and weakness. Diarrhea for a month. History of diabetes, congestive heart failure, and renal failure.  EXAM: CHEST  2 VIEW  COMPARISON:  None.  FINDINGS: Borderline heart size and pulmonary vascularity. No edema or consolidation in the lungs. No blunting of costophrenic angles. No pneumothorax. Mediastinal contours appear intact. Degenerative changes in the spine and shoulders.  IMPRESSION: Borderline heart size and pulmonary vascularity. No edema or consolidation.   Electronically Signed   By: Lucienne Capers M.D.   On: 08/14/2015 03:59    EKG:   Orders placed or performed during the hospital encounter of 08/14/15  . ED EKG  . ED EKG  . EKG 12-Lead  . EKG 12-Lead   sinus bradycardia 59 bpm  IMPRESSION AND PLAN:   #1 severe hyperkalemia with bradycardia: Has underlying Ckd stage 5 and dietary indiscretion. Admit him to telemetry, patient received potassium shifting measures in the emergency room including  6 bicarbonate, calcium gluconate, Kayexalate, insulin. Monitor on telemetry. Repeat the potassium in 2 hours. Nephrology is consulted secondary to hyperkalemia and worsening renal failure possible need for starting hemodialysis. #2 CK D stage V with worsening renal failure with possible uremic symptoms of nausea poor by mouth intake generalized weakness appreciated nephrology consult. #3 acute on chronic anemia patient asymptomatic he says that his hemoglobin was as low as 5.9 at one time right now we're going to get transfuse him 1 unit of packed RBC, check the CBC again after transfusion. #4 bradycardia hold the beta blockers at this time and monitor on telemetry. Patient is asymptomatic. #5 hypoglycemia in the emergency room: Check the Accu-Cheks every 3 hours, hold the Lantus #6 chronic  diastolic heart failure: Stable at this time continue Lasix, HCTZ, hold the cOREG secondary to bradycardia..   All the records are reviewed and case discussed with ED provider. Management plans discussed with the patient, family and they are in agreement.  CODE STATUS: Full  TOTAL TIME TAKING CARE OF THIS PATIENT: 55 minutes.    Epifanio Lesches M.D on 08/14/2015 at 8:34 AM  Between 7am to 6pm - Pager - [phone]  After 6pm go to [email or web address] - password [email or web address] [facility]  Office  [phone]  CC: Primary care physician; Midge Minium, [place]

## 2015-08-14 NOTE — Progress Notes (Addendum)
Patient becoming increasingly agitated and aggressive. Yelling out, cursing, tearing his SCDs off, refusing blood pressure cuff, trying to get OOB unattended. Patient is very unsteady on his feet, requiring walker and 2+ standby assist, he walks with a shuffled gait and has decreased alertness due to ativan administered during dialysis. Charge nurse has been notified and will try to obtain a sitterr.   Patient is also currently refusing in/out catheter.  Update: 7:02 PM Dr. Tressia Miners paged, received sitter order and 1 time haldol order.

## 2015-08-14 NOTE — Progress Notes (Signed)
Inpatient Diabetes Program Recommendations  AACE/ADA: New Consensus Statement on Inpatient Glycemic Control (2013)  Target Ranges:  Prepandial:   less than 140 mg/dL      Peak postprandial:   less than 180 mg/dL (1-2 hours)      Critically ill patients:  140 - 180 mg/dL   Results for Richard Zavala, Richard Zavala (MRN [id number]) as of 08/14/2015 08:42  Ref. Range 08/14/2015 06:25  Glucose-Capillary Latest Ref Range: 65-99 mg/dL 45 (L)   Diabetes history: DM2 Outpatient Diabetes medications: Lantus 17 units QHS Current orders for Inpatient glycemic control: None  Inpatient Diabetes Program Recommendations Glucose: Noted that patient's glucose was 45 mg/dl at 6:25 am and patient received Novolog 10 units at 7:02 am for hyperkalemia treatment. Please monitor glucose Q1H until stable and treat hypoglycemia per protocol. Correction (SSI): Please consider ordering CBGs Q1H and once glucose is stable change to Q4H and order Novolog sensitive correction Q4H.   Thanks, Barnie Alderman, RN, MSN, CCRN, CDE Diabetes Coordinator Inpatient Diabetes Program [phone] (Team Pager from [place] to [place]) [phone] ([facility] office) [phone] [facility] office) [phone] [facility] office)

## 2015-08-14 NOTE — Progress Notes (Addendum)
Patient increasingly agitated, says its due to his pain. Dr. Vianne Bulls notified and RN received orders for ABG and IV ativan once. Respiratory notified and ativan given. Patient called down to dialysis, RN confirmed with dialysis that patient can receive blood transfusion while there. Respiratory called and states they cannot draw ABG while patient is receiving hemodialysis; RN will rescheduled ABG to be drawn when dialysis complete.  Patient sent to dialysis with consents for procedure and blood transfusion signed and in the chart.

## 2015-08-14 NOTE — Progress Notes (Signed)
RN received report from [facility] in ED. Only documented CBG was 45, no follow up CBG showed up in results. This RN asked for f/u cbg before patient came to floor. ED RN stated that it was 130 and the patient was already on his way up. Upon arrival to the floor, patient was shaky and quiet, but otherwise asymptomatic. RN checked CBG, it was 25. Charge nurse and Dr. Vianne Bulls notified, verbal order for 2 amps of D50 received and immediately admdinistered to patient. CBG rechecked, it was 292.   Patient now writhing in bed complaining of upper back/neck pain. Dr. Vianne Bulls at nurses station notified again and RN received verbal order for DG and percocet.

## 2015-08-14 NOTE — Progress Notes (Signed)
Patient seen during dialysis Tolerating well  Cannulated successfully by nurse BFR 150/DFR 500 Patient sedated with ativan because of agitation and restlessness - high risk for infiltration

## 2015-08-14 NOTE — ED Provider Notes (Signed)
[facility] Emergency Department Provider Note  ____________________________________________  Time seen: Approximately [id number] AM  I have reviewed the triage vital signs and the nursing notes.   HISTORY  Chief Complaint Diarrhea; Rib Injury; and Nasal Congestion    HPI Richard Zavala is a 54 y.o. male who comes in with multiple medical complaints. The patient reports that for the last few days he has been feeling weak in his arms and legs. The patient also complains of drainage from his eye and nose for 1 month and diarrhea for the last month. The patient denies SOB but has some left sided rib pain after falling into his bed post 3 days ago. The patient denies hitting his head. The patient reports that his right arm feels numb whenever he lays down. . The patient also feels like he has fluid in his ear as well. Overall the patient reports that he just feels bad so he woke up is son and came to the hospital. When asked why he hadn't followed up with his doctor with symptoms for 1 month and his son replied that he refuses to go to his physician. The patient says that he is supposed to start dialysis before  The end of the year but he does not have specific time when he will start.     Past Medical History  Diagnosis Date  . Diabetes mellitus without complication   . Hypertension   . Chronic kidney disease (CKD), stage IV (severe)   . CHF (congestive heart failure)   . Hyperlipemia   . Osteoporosis   . Anemia   . TIA (transient ischemic attack)   . Stroke   . Wilms' tumor   . CAD (coronary artery disease)   . Pulmonary HTN   . TRD (traction retinal detachment)   . Blind     There are no active problems to display for this patient.   Past Surgical History  Procedure Laterality Date  . Nephrectomy radical    . Eye surgery    . Hip surgery    . Av fistula placement      Current Outpatient Rx  Name  Route  Sig  Dispense  Refill  . aspirin EC 81 MG  tablet   Oral   Take 81 mg by mouth daily.         Marland Kitchen atorvastatin (LIPITOR) 80 MG tablet   Oral   Take 80 mg by mouth daily.         . calcitRIOL (ROCALTROL) 0.25 MCG capsule   Oral   Take 1 capsule by mouth daily.         . calcium acetate (PHOSLO) 667 MG capsule   Oral   Take 2 capsules by mouth 3 (three) times daily.         . carvedilol (COREG) 12.5 MG tablet   Oral   Take 37.5 mg by mouth 2 (two) times daily.         . Cholecalciferol (D 2000) 2000 UNITS TABS   Oral   Take 1,000 mg by mouth daily.         . ferrous sulfate 325 (65 FE) MG tablet   Oral   Take 325 mg by mouth every morning.         . furosemide (LASIX) 40 MG tablet   Oral   Take 40 mg by mouth daily.         . hydrochlorothiazide (HYDRODIURIL) 25 MG tablet   Oral  Take 25 mg by mouth 2 (two) times daily.         . insulin glargine (LANTUS) 100 UNIT/ML injection   Subcutaneous   Inject 17 Units into the skin at bedtime.         . tamsulosin (FLOMAX) 0.4 MG CAPS capsule   Oral   Take 1 capsule by mouth daily.           Allergies Metformin and Penicillins  No family history on file.  Social History Social History  Substance Use Topics  . Smoking status: Former Research scientist (life sciences)  . Smokeless tobacco: None  . Alcohol Use: None    Review of Systems Constitutional: No fever/chills Eyes: No visual changes. ENT: No sore throat. Cardiovascular: left rib pain Respiratory: Denies shortness of breath. Gastrointestinal: diarrhea, No abdominal pain.  No nausea, no vomiting. No constipation. Genitourinary: Negative for dysuria. Musculoskeletal: neck pain Skin: Negative for rash. Neurological: generalized weakness arms and legs  10-point ROS otherwise negative.  ____________________________________________   PHYSICAL EXAM:  VITAL SIGNS: ED Triage Vitals  Enc Vitals Group     BP 08/14/15 0330 120/56 mmHg     Pulse Rate 08/14/15 0330 62     Resp 08/14/15 0330 18     Temp  08/14/15 0645 97.5 F (36.4 C)     Temp Source 08/14/15 0330 Oral     SpO2 08/14/15 0330 98 %     Weight 08/14/15 0330 165 lb (74.844 kg)     Height 08/14/15 0330 5\' 8"  (1.727 m)     Head Cir --      Peak Flow --      Pain Score 08/14/15 0330 4     Pain Loc --      Pain Edu? --      Excl. in [place]? --     Constitutional: Alert and oriented. Well appearing and in mild distress. Eyes: Conjunctivae are normal. PERRL. EOMI. Head: Atraumatic. Nose: No congestion/rhinnorhea. Mouth/Throat: Mucous membranes are moist.  Oropharynx non-erythematous. Cardiovascular: Normal rate, regular rhythm. Grossly normal heart sounds.  Good peripheral circulation. Respiratory: Normal respiratory effort.  No retractions. Lungs CTAB. Gastrointestinal: Soft and nontender. No distention. Positive bowel sounds Musculoskeletal: No lower extremity tenderness nor edema.   Neurologic:  Normal speech and language.  Skin:  Skin is warm, dry and intact.  Psychiatric: Mood and affect are normal.   ____________________________________________   LABS (all labs ordered are listed, but only abnormal results are displayed)  Labs Reviewed  COMPREHENSIVE METABOLIC PANEL - Abnormal; Notable for the following:    Sodium 133 (*)    Potassium 6.1 (*)    Chloride 114 (*)    CO2 10 (*)    Glucose, Bld 54 (*)    BUN 63 (*)    Creatinine, Ser 10.34 (*)    Calcium 7.4 (*)    Total Protein 6.1 (*)    Albumin 3.1 (*)    ALT 16 (*)    GFR calc non Af Amer 5 (*)    GFR calc Af Amer 6 (*)    All other components within normal limits  CBC - Abnormal; Notable for the following:    RBC 2.58 (*)    Hemoglobin 6.5 (*)    HCT 21.3 (*)    MCH 25.0 (*)    MCHC 30.3 (*)    RDW 17.9 (*)    Platelets 141 (*)    All other components within normal limits  TROPONIN I - Abnormal; Notable for the  following:    Troponin I 0.05 (*)    All other components within normal limits  GLUCOSE, CAPILLARY - Abnormal; Notable for the following:     Glucose-Capillary 45 (*)    All other components within normal limits  LIPASE, BLOOD  URINALYSIS COMPLETEWITH MICROSCOPIC ([facility] ONLY)  TYPE AND SCREEN  ABO/RH   ____________________________________________  EKG  ED ECG REPORT I, Loney Hering, the attending physician, personally viewed and interpreted this ECG.   Date: 08/14/2015  EKG Time: 339  Rate: 59  Rhythm: normal EKG, normal sinus rhythm, unchanged from previous tracings, sinus bradycardia  Axis: normal  Intervals:none  ST&T Change: none  ____________________________________________  RADIOLOGY  CXR: Borderline heart size and pulmonary vascularity, no edema or consolidation ____________________________________________   PROCEDURES  Procedure(s) performed: None  Critical Care performed: Yes, see critical care note(s)  CRITICAL CARE Performed by: Charlesetta Ivory P   Total critical care time: 30 min  Critical care time was exclusive of separately billable procedures and treating other patients.  Critical care was necessary to treat or prevent imminent or life-threatening deterioration.  Critical care was time spent personally by me on the following activities: development of treatment plan with patient and/or surrogate as well as nursing, discussions with consultants, evaluation of patient's response to treatment, examination of patient, obtaining history from patient or surrogate, ordering and performing treatments and interventions, ordering and review of laboratory studies, ordering and review of radiographic studies, pulse oximetry and re-evaluation of patient's condition.  ____________________________________________   INITIAL IMPRESSION / ASSESSMENT AND PLAN / ED COURSE  Pertinent labs & imaging results that were available during my care of the patient were reviewed by me and considered in my medical decision making (see chart for details).  After reviewing the blood work it was shown that  the patient's potassium is elevated. I gave the patient some calcium gluconate, sodium bicarbonate, insulin and dextrose, the patient also is hypoglycemic. The patient has a creatinine of 10.34 which is much higher than his previous 6 at [facility] one month ago. The patient also has a hemoglobin of 6.5 and a hematocrit of 21.3 which is one point lower than it was one month ago. The patient is unsure when he spoke to start dialysis but given his hyperkalemia increased creatinine as well as his decreased hemoglobin and hematocrit feel it is appropriate to admit the patient to the hospital. The patient also has hypoglycemia. I am unsure of exactly taking his medications and following up as appropriate. I contacted the hospitalist service who will accept the patient. I will also contact nephrology to begin to establish this patient on dialysis.  I discussed the patient with Dr. Candiss Norse the nephrologist who will see him to establish dialysis. ____________________________________________   FINAL CLINICAL IMPRESSION(S) / ED DIAGNOSES  Final diagnoses:  Hyperkalemia  Renal failure  Hypoglycemia  Anemia due to other cause  Weakness      Loney Hering, MD 08/14/15 [phone]

## 2015-08-14 NOTE — ED Notes (Signed)
Patient presents to ED with son with multiple medical complaints, when asked patient reason for coming to hospital patient states "I don't know.Marland Kitchena little bit of everything." when asked patient to elaborate, patient responded "arm weakness and thigh weakness for 2 days....eye has been dripping constantly for a month.Marland KitchenMarland KitchenI fell on a bedpost Saturday and felt like I cracked a rib or something...have hand numbness in both hands for 2 days...my nose is running cause my eye is running....my left ear is messed up, when I lay down it clears up but when I sit up it feels like fluid, this has been going on for a month....diarrhea for a month." Patient reports has not seen PCP for symptoms, per son, patient refuses to see PCP. Patient also reports "my neck has been hurting." Patient denies hitting head or LOC when he fell on Saturday. Patient reports is blind in left eye. Patient alert and oriented x 4, speaking in complete sentences, no increased work in breathing.

## 2015-08-14 NOTE — ED Notes (Signed)
CBG 130 

## 2015-08-14 NOTE — ED Notes (Addendum)
Pt to triage via w/c with no distress noted; pt st "my neck hurts for several days, my thighs hurt, I fell on my bedpost Saturday and cracked some ribs; my eyes and noses have been draining for month, diarrhea for a month and I feel weak"; st hx diabetes, CHF and renal failure; fistula in place to left arm but not in use yet

## 2015-08-15 LAB — URINE DRUG SCREEN, QUALITATIVE (ARMC ONLY)
Amphetamines, Ur Screen: NOT DETECTED
BARBITURATES, UR SCREEN: NOT DETECTED
BENZODIAZEPINE, UR SCRN: NOT DETECTED
COCAINE METABOLITE, UR ~~LOC~~: NOT DETECTED
Cannabinoid 50 Ng, Ur ~~LOC~~: NOT DETECTED
MDMA (Ecstasy)Ur Screen: NOT DETECTED
Methadone Scn, Ur: NOT DETECTED
OPIATE, UR SCREEN: NOT DETECTED
PHENCYCLIDINE (PCP) UR S: NOT DETECTED
Tricyclic, Ur Screen: NOT DETECTED

## 2015-08-15 LAB — BLOOD GAS, ARTERIAL
ACID-BASE DEFICIT: 8 mmol/L — AB (ref 0.0–2.0)
ALLENS TEST (PASS/FAIL): POSITIVE — AB
Bicarbonate: 15.4 mEq/L — ABNORMAL LOW (ref 21.0–28.0)
FIO2: 0.21
O2 SAT: 97.5 %
PCO2 ART: 26 mmHg — AB (ref 32.0–48.0)
Patient temperature: 37
pH, Arterial: 7.38 (ref 7.350–7.450)
pO2, Arterial: 98 mmHg (ref 83.0–108.0)

## 2015-08-15 LAB — CBC
HCT: 23.2 % — ABNORMAL LOW (ref 40.0–52.0)
HEMOGLOBIN: 7.5 g/dL — AB (ref 13.0–18.0)
MCH: 25.7 pg — AB (ref 26.0–34.0)
MCHC: 32.2 g/dL (ref 32.0–36.0)
MCV: 79.9 fL — AB (ref 80.0–100.0)
Platelets: 143 10*3/uL — ABNORMAL LOW (ref 150–440)
RBC: 2.9 MIL/uL — AB (ref 4.40–5.90)
RDW: 17.9 % — ABNORMAL HIGH (ref 11.5–14.5)
WBC: 9 10*3/uL (ref 3.8–10.6)

## 2015-08-15 LAB — BASIC METABOLIC PANEL
ANION GAP: 10 (ref 5–15)
BUN: 96 mg/dL — ABNORMAL HIGH (ref 6–20)
CALCIUM: 7.7 mg/dL — AB (ref 8.9–10.3)
CHLORIDE: 110 mmol/L (ref 101–111)
CO2: 18 mmol/L — AB (ref 22–32)
CREATININE: 8.4 mg/dL — AB (ref 0.61–1.24)
GFR calc non Af Amer: 6 mL/min — ABNORMAL LOW (ref >60)
GFR, EST AFRICAN AMERICAN: 7 mL/min — AB (ref >60)
GLUCOSE: 102 mg/dL — AB (ref 65–99)
Potassium: 4.4 mmol/L (ref 3.5–5.1)
Sodium: 138 mmol/L (ref 135–145)

## 2015-08-15 LAB — TYPE AND SCREEN
ABO/RH(D): O POS
ANTIBODY SCREEN: NEGATIVE
Unit division: 0

## 2015-08-15 LAB — TROPONIN I
TROPONIN I: 0.65 ng/mL — AB (ref <0.031)
TROPONIN I: 0.64 ng/mL — AB (ref <0.031)

## 2015-08-15 LAB — GLUCOSE, CAPILLARY
GLUCOSE-CAPILLARY: 166 mg/dL — AB (ref 65–99)
GLUCOSE-CAPILLARY: 221 mg/dL — AB (ref 65–99)
GLUCOSE-CAPILLARY: 163 mg/dL — AB (ref 65–99)
Glucose-Capillary: 114 mg/dL — ABNORMAL HIGH (ref 65–99)

## 2015-08-15 LAB — HEPATITIS C ANTIBODY: HCV Ab: <0.1 s/co ratio (ref 0.0–0.9)

## 2015-08-15 LAB — HEPATITIS B CORE ANTIBODY, TOTAL: HEP B C TOTAL AB: NEGATIVE

## 2015-08-15 LAB — HEPATITIS B SURFACE ANTIBODY,QUALITATIVE: HEP B S AB: NONREACTIVE

## 2015-08-15 LAB — HEPATITIS B SURFACE ANTIGEN: Hepatitis B Surface Ag: NEGATIVE

## 2015-08-15 LAB — TRANSFERRIN: Transferrin: 158 mg/dL — ABNORMAL LOW (ref 180–329)

## 2015-08-15 MED ORDER — CARVEDILOL 25 MG PO TABS: 37.5000 mg | ORAL_TABLET | Freq: Two times a day (BID) | ORAL | Status: DC

## 2015-08-15 MED ORDER — NITROGLYCERIN 2 % TD OINT
1.0000 [in_us] | TOPICAL_OINTMENT | Freq: Four times a day (QID) | TRANSDERMAL | Status: DC
  Administered 2015-08-15 – 2015-08-16 (×4): 1 [in_us] via TOPICAL
  Filled 2015-08-15 (×4): qty 1

## 2015-08-15 MED ORDER — CARVEDILOL 25 MG PO TABS
37.5000 mg | ORAL_TABLET | Freq: Two times a day (BID) | ORAL | Status: DC
  Administered 2015-08-16 – 2015-08-17 (×3): 37.5 mg via ORAL
  Filled 2015-08-15: qty 1
  Filled 2015-08-15: qty 2
  Filled 2015-08-15 (×2): qty 1

## 2015-08-15 MED ORDER — EPOETIN ALFA 10000 UNIT/ML IJ SOLN
4000.0000 [IU] | INTRAMUSCULAR | Status: DC
  Administered 2015-08-15: 4000 [IU] via INTRAVENOUS

## 2015-08-15 MED ORDER — LOSARTAN POTASSIUM 50 MG PO TABS
100.0000 mg | ORAL_TABLET | Freq: Every day | ORAL | Status: DC
  Administered 2015-08-15 – 2015-08-16 (×2): 100 mg via ORAL
  Filled 2015-08-15 (×3): qty 2

## 2015-08-15 MED ORDER — CARVEDILOL 25 MG PO TABS
37.5000 mg | ORAL_TABLET | Freq: Once | ORAL | Status: AC
  Administered 2015-08-15: 37.5 mg via ORAL
  Filled 2015-08-15: qty 1

## 2015-08-15 NOTE — Progress Notes (Signed)
Dr. Ether Griffins notified of troponin 0.65

## 2015-08-15 NOTE — Care Management (Signed)
Patient has exhibited some periods of agitation over the past 24 hours.  Discussed in progression the need to have patient sit for dialysis.  Dialysis coordination is securing chair in outpatient facility

## 2015-08-15 NOTE — Progress Notes (Signed)
Dr. Ether Griffins notified of patient's BP post daily medications. Coreg not due until 5pm. Per MD, go ahead and give a one time dose of the coreg now and reassess at 5pm before giving scheduled dinner time dose.

## 2015-08-15 NOTE — Progress Notes (Signed)
Subjective:  Patient seen during dialysis Tolerating well  Calmer today Alert, no c/o Dialysis going well   Objective:  Vital signs in last 24 hours:  Temp:  [97.2 F (36.2 C)-98.6 F (37 C)] 98.1 F (36.7 C) (08/25 1221) Pulse Rate:  [65-79] 77 (08/25 1221) Resp:  [14-28] 18 (08/25 1210) BP: (150-206)/(62-114) 206/79 mmHg (08/25 1210) SpO2:  [93 %-98 %] 97 % (08/25 1210) Weight:  [78.8 kg (173 lb 11.6 oz)-80.2 kg (176 lb 12.9 oz)] 80.2 kg (176 lb 12.9 oz) (08/25 0930)  Weight change: -0.043 kg (-1.5 oz) Filed Weights   08/14/15 1039 08/14/15 1418 08/15/15 0930  Weight: 74.8 kg (164 lb 14.5 oz) 78.8 kg (173 lb 11.6 oz) 80.2 kg (176 lb 12.9 oz)    Intake/Output:    Intake/Output Summary (Last 24 hours) at 08/15/15 1222 Last data filed at 08/15/15 0700  Gross per 24 hour  Intake 306.11 ml  Output    850 ml  Net -543.89 ml     Physical Exam: General: NAD, laying in bed  HEENT  moist mucous membranes   Neck supple  Pulm/lungs  normal effort, clear bilaterally   CVS/Heart  no rub or gallop, soft systolic murmur   Abdomen:   soft, nontender   Extremities:  trace peripheral edema   Neurologic:  alert, followed commands   Skin:  no acute rashes   Access:  AV fistula        Basic Metabolic Panel:   Recent Labs Lab 08/14/15 0347 08/14/15 1158 08/14/15 1515 08/15/15 0455  NA 133*  --   --  138  K 6.1* 6.1* 3.9 4.4  CL 114*  --   --  110  CO2 10*  --   --  18*  GLUCOSE 54*  --   --  102*  BUN 63*  --   --  96*  CREATININE 10.34*  --   --  8.40*  CALCIUM 7.4*  --   --  7.7*     CBC:  Recent Labs Lab 08/14/15 0347 08/14/15 1158 08/14/15 1920 08/15/15 0455  WBC 6.6  --   --  9.0  HGB 6.5* 6.3* 7.4* 7.5*  HCT 21.3* 20.7* 23.2* 23.2*  MCV 82.5  --   --  79.9*  PLT 141*  --   --  143*      Microbiology:  Recent Results (from the past 720 hour(s))  MRSA PCR Screening     Status: None   Collection Time: 08/14/15  8:00 PM  Result Value Ref  Range Status   MRSA by PCR NEGATIVE NEGATIVE Final    Comment:        The GeneXpert MRSA Assay (FDA approved for NASAL specimens only), is one component of a comprehensive MRSA colonization surveillance program. It is not intended to diagnose MRSA infection nor to guide or monitor treatment for MRSA infections.     Coagulation Studies: No results for input(s): LABPROT, INR in the last 72 hours.  Urinalysis: No results for input(s): COLORURINE, LABSPEC, PHURINE, GLUCOSEU, HGBUR, BILIRUBINUR, KETONESUR, PROTEINUR, UROBILINOGEN, NITRITE, LEUKOCYTESUR in the last 72 hours.  Invalid input(s): APPERANCEUR    Imaging: Dg Neck Soft Tissue  08/14/2015   CLINICAL DATA:  Increasing pain.  History of stroke.  EXAM: NECK SOFT TISSUES - 1+ VIEW  COMPARISON:  CT 11 06/2012.  FINDINGS: Epiglottis normal. Retropharyngeal space normal. Cervical airway widely patent. Degenerative changes cervical spine loss of normal cervical lordosis. No acute bony abnormality identified. Carotid  atherosclerotic vascular calcification.  IMPRESSION: 1. Carotid atherosclerotic vascular disease. 2. Soft tissues of the neck are otherwise unremarkable. Degenerative changes cervical spine.   Electronically Signed   By: [place]   On: 08/14/2015 14:45   Dg Chest 2 View  08/14/2015   CLINICAL DATA:  Rib pain and weakness. Diarrhea for a month. History of diabetes, congestive heart failure, and renal failure.  EXAM: CHEST  2 VIEW  COMPARISON:  None.  FINDINGS: Borderline heart size and pulmonary vascularity. No edema or consolidation in the lungs. No blunting of costophrenic angles. No pneumothorax. Mediastinal contours appear intact. Degenerative changes in the spine and shoulders.  IMPRESSION: Borderline heart size and pulmonary vascularity. No edema or consolidation.   Electronically Signed   By: Lucienne Capers M.D.   On: 08/14/2015 03:59     Medications:     . sodium chloride   Intravenous Once  . sodium  chloride   Intravenous Once  . aspirin EC  81 mg Oral Daily  . atorvastatin  80 mg Oral Daily  . calcitRIOL  0.25 mcg Oral Daily  . calcium acetate  1,334 mg Oral TID  . cholecalciferol  2,000 Units Oral Daily  . epoetin (EPOGEN/PROCRIT) injection  4,000 Units Intravenous Q T,Th,Sa-HD  . ferrous sulfate  325 mg Oral q morning - 10a  . furosemide  40 mg Oral Daily  . haloperidol lactate  1 mg Intravenous Once  . hydrochlorothiazide  25 mg Oral BID  . insulin aspart  0-9 Units Subcutaneous TID WC  . sodium chloride  3 mL Intravenous Q12H  . tamsulosin  0.4 mg Oral Daily   sodium chloride, acetaminophen **OR** acetaminophen, ondansetron **OR** ondansetron (ZOFRAN) IV, oxyCODONE-acetaminophen, senna-docusate, sodium chloride  Assessment/ Plan:  54 y.o. male with medical problems of long-standing diabetes with complications of neuropathy, retinopathy coronary disease with history of heart catheterization in the past, BPH, left nephrectomy in 1971 due to Wilms tumor, pulmonary hypertension, congestive heart failure, anemia, hypertension , was admitted on 08/14/2015 with fatigue, poor appetite, worsening of renal failure and severe anemia.  1. End-stage renal disease. Likely secondary to diabetic nephropathy - started on dialysis via his left forearm AV fistula - #2 treatment completed today. Tolerated well. 2.5 hrs - Start discharge planning  - 3.5 hr treatment tomorrow 2. Anemia of chronic kidney disease - Obtain iron studies. We'll start Procrit if iron studies are adequate  3. Secondary hyperparathyroidism - Dietary counseling - Continue PhosLo 4. Diabetes type 2 with CKD 5. Hyperkalemia -  Improved with dialysis 6. HTN - start ARB- losartan   LOS: 1 Fue Cervenka 8/25/201612:22 PM

## 2015-08-15 NOTE — Progress Notes (Signed)
Initial Nutrition Assessment    INTERVENTION:   Meals and Snacks: Cater to patient preferences Medical Food Supplement Therapy: if po intake inadequate on follow-up, recommend addition of nutritional supplement and/or snacks between meals Education: provided verbal/written education on dialysis diet; provided pt with written material on dietary guidelines for adults starting on dialysis with separate handouts on potassium and phosphorus. Reviewed importance of increasing protein intake while limiting sodium, potassium and phosphorus with review of protein containing foods as well as foods high in potassium and phosphorus. Pt seemed only partly interested in education on visit today, fair adhearence likely.    NUTRITION DIAGNOSIS:   Food and nutrition related knowledge deficit related to  (ESRD) as evidenced by  (starting HD, no prior education on dialysis diet).  GOAL:   Patient will meet greater than or equal to 90% of their needs  MONITOR:    (Energy Intake, Anthropometrics, Electrolyte/Renal Profile, Glucose Profile,  Digestive System)  REASON FOR ASSESSMENT:   Consult Diet education  ASSESSMENT:    Pt admitted with diarrhea, weakness, nausea with decreased oral intake for 2 days; hyperkalemic requiring initiation of HD via left arm fistula; received 1st treatment yesterday, 2nd treatment this AM with plans for 3rd treatment tomorrow.   Past Medical History  Diagnosis Date  . Diabetes mellitus without complication   . Hypertension   . Chronic kidney disease (CKD), stage IV (severe)   . CHF (congestive heart failure)   . Hyperlipemia   . Osteoporosis   . Anemia   . TIA (transient ischemic attack)   . Stroke   . Wilms' tumor   . CAD (coronary artery disease)   . Pulmonary HTN   . TRD (traction retinal detachment)   . Blind    Diet Order:  Diet renal/carb modified with fluid restriction Diet-HS Snack?: Nothing; Room service appropriate?: Yes; Fluid consistency:: Thin    Energy Intake: pt reports appetite improving, ate good lunch today. Recorded po intake last night 20%  Food and Nutrition related history: pt reports appetite has been good at thome  Glucose Profile:  Recent Labs  08/14/15 2003 08/15/15 0722 08/15/15 1254  GLUCAP 122* 114* 166*   Electrolyte and Renal Profile:  Recent Labs Lab 08/14/15 0347 08/14/15 1158 08/14/15 1515 08/15/15 0455  BUN 63*  --   --  96*  CREATININE 10.34*  --   --  8.40*  NA 133*  --   --  138  K 6.1* 6.1* 3.9 4.4    Nutritional Anemia Profile:  CBC Latest Ref Rng 08/15/2015 08/14/2015 08/14/2015  WBC 3.8 - 10.6 K/uL 9.0 - -  Hemoglobin 13.0 - 18.0 g/dL 7.5(L) 7.4(L) 6.3(L)  Hematocrit 40.0 - 52.0 % 23.2(L) 23.2(L) 20.7(L)  Platelets 150 - 440 K/uL 143(L) - -    Protein Profile:  Recent Labs Lab 08/14/15 0347  ALBUMIN 3.1*   Meds: phoslo, ss novolog   Skin:  Reviewed, no issues  Last BM:  8/23  Height:   Ht Readings from Last 1 Encounters:  08/14/15 5\' 8"  (1.727 m)    Weight: pt reports weight has been stable; UBW between 165-170 pounds  Wt Readings from Last 1 Encounters:  08/15/15 175 lb 14.4 oz (79.788 kg)   Filed Weights   08/15/15 0930 08/15/15 1221 08/15/15 1246  Weight: 176 lb 12.9 oz (80.2 kg) 175 lb 14.8 oz (79.8 kg) 175 lb 14.4 oz (79.788 kg)  ' BMI:  Body mass index is 26.75 kg/(m^2).  Estimated Nutritional Needs:  Kcal:  2298-2506 kcals (BEE 1607, 1.3 AF, 1.1-1.2 IF)   Protein:  95-119 g (1.2-1.5 g/kg)   Fluid:  1000 mL plus [place] MS, RD, LDN [phone] Pager

## 2015-08-15 NOTE — Progress Notes (Signed)
Spoke to Dr. Candiss Norse regarding bladder scan of 420mL, order for 1 in and out cath received and placed. Earleen Reaper, RN

## 2015-08-15 NOTE — Progress Notes (Signed)
Post hd tx 

## 2015-08-15 NOTE — Progress Notes (Signed)
Dr. Candiss Norse notified that patient is refusing in and out cath. Order to bladder scan again tomorrow morning.

## 2015-08-15 NOTE — Progress Notes (Signed)
[name] NAME: Richard Zavala    MR#:  [id number]  DATE OF BIRTH:  Nov 22, 1961  SUBJECTIVE:  CHIEF COMPLAINT:   Chief Complaint  Patient presents with  . Diarrhea  . Rib Injury  . Nasal Congestion   patient is 54 year old Caucasian male with history of CK D who presents to the hospital with diarrhea, weakness, generalized body pains as well as nausea and decreased oral intake for the past 2 days. In emergency room, he was noted to have high potassium levels and was admitted to the hospital, he underwent hemodialysis yesterday 24th of August 2016 and is undergoing hemodialysis today 25th of August 2016. He feels comfortable, although admits of having pain between his shoulders and upper back yesterday, some pain in the left side of the chest after fall. Denies shortness of breath. Seen during hemodialysis.    Review of Systems  Constitutional: Negative for fever, chills and weight loss.  HENT: Negative for congestion.   Eyes: Negative for blurred vision and double vision.  Respiratory: Negative for cough, sputum production, shortness of breath and wheezing.   Cardiovascular: Negative for chest pain, palpitations, orthopnea, leg swelling and PND.  Gastrointestinal: Negative for nausea, vomiting, abdominal pain, diarrhea, constipation and blood in stool.  Genitourinary: Negative for dysuria, urgency, frequency and hematuria.  Musculoskeletal: Positive for back pain and neck pain. Negative for falls.  Neurological: Negative for dizziness, tremors, focal weakness and headaches.  Endo/Heme/Allergies: Does not bruise/bleed easily.  Psychiatric/Behavioral: Negative for depression. The patient does not have insomnia.     VITAL SIGNS: Blood pressure 199/69, pulse 76, temperature 98.7 F (37.1 C), temperature source Oral, resp. rate 17, height 5\' 8"  (1.727 m), weight 79.788 kg (175 lb 14.4 oz), SpO2 97 %.  PHYSICAL EXAMINATION:    GENERAL:  54 y.o.-year-old patient lying in the bed with no acute distress , laying in bed.  EYES: Pupils equal, round, reactive to light and accommodation. No scleral icterus. Extraocular muscles intact.  HEENT: Head atraumatic, normocephalic. Oropharynx and nasopharynx clear.  NECK:  Supple, no jugular venous distention. No thyroid enlargement, no tenderness.  LUNGS: Normal breath sounds bilaterally, no wheezing, rales,rhonchi or crepitation. No use of accessory muscles of respiration.  CARDIOVASCULAR: S1, S2 normal. No murmurs, rubs, or gallops.  ABDOMEN: Soft, nontender, nondistended. Bowel sounds present. No organomegaly or mass.  EXTREMITIES: No pedal edema, cyanosis, or clubbing.  NEUROLOGIC: Cranial nerves II through XII are intact. Muscle strength 5/5 in all extremities. Sensation intact. Gait not checked.  PSYCHIATRIC: The patient is alert and oriented x 3.  SKIN: No obvious rash, lesion, or ulcer.   ORDERS/RESULTS REVIEWED:   CBC  Recent Labs Lab 08/14/15 0347 08/14/15 1158 08/14/15 1920 08/15/15 0455  WBC 6.6  --   --  9.0  HGB 6.5* 6.3* 7.4* 7.5*  HCT 21.3* 20.7* 23.2* 23.2*  PLT 141*  --   --  143*  MCV 82.5  --   --  79.9*  MCH 25.0*  --   --  25.7*  MCHC 30.3*  --   --  32.2  RDW 17.9*  --   --  17.9*   ------------------------------------------------------------------------------------------------------------------  Chemistries   Recent Labs Lab 08/14/15 0347 08/14/15 1158 08/14/15 1515 08/15/15 0455  NA 133*  --   --  138  K 6.1* 6.1* 3.9 4.4  CL 114*  --   --  110  CO2 10*  --   --  18*  GLUCOSE 54*  --   --  102*  BUN 63*  --   --  96*  CREATININE 10.34*  --   --  8.40*  CALCIUM 7.4*  --   --  7.7*  AST 19  --   --   --   ALT 16*  --   --   --   ALKPHOS 74  --   --   --   BILITOT 0.4  --   --   --    ------------------------------------------------------------------------------------------------------------------ estimated creatinine  clearance is 10 mL/min (by C-G formula based on Cr of 8.4). ------------------------------------------------------------------------------------------------------------------ No results for input(s): TSH, T4TOTAL, T3FREE, THYROIDAB in the last 72 hours.  Invalid input(s): FREET3  Cardiac Enzymes  Recent Labs Lab 08/14/15 0347  TROPONINI 0.05*   ------------------------------------------------------------------------------------------------------------------ Invalid input(s): POCBNP ---------------------------------------------------------------------------------------------------------------  RADIOLOGY: Dg Neck Soft Tissue  08/14/2015   CLINICAL DATA:  Increasing pain.  History of stroke.  EXAM: NECK SOFT TISSUES - 1+ VIEW  COMPARISON:  CT 11 06/2012.  FINDINGS: Epiglottis normal. Retropharyngeal space normal. Cervical airway widely patent. Degenerative changes cervical spine loss of normal cervical lordosis. No acute bony abnormality identified. Carotid atherosclerotic vascular calcification.  IMPRESSION: 1. Carotid atherosclerotic vascular disease. 2. Soft tissues of the neck are otherwise unremarkable. Degenerative changes cervical spine.   Electronically Signed   By: [place]   On: 08/14/2015 14:45   Dg Chest 2 View  08/14/2015   CLINICAL DATA:  Rib pain and weakness. Diarrhea for a month. History of diabetes, congestive heart failure, and renal failure.  EXAM: CHEST  2 VIEW  COMPARISON:  None.  FINDINGS: Borderline heart size and pulmonary vascularity. No edema or consolidation in the lungs. No blunting of costophrenic angles. No pneumothorax. Mediastinal contours appear intact. Degenerative changes in the spine and shoulders.  IMPRESSION: Borderline heart size and pulmonary vascularity. No edema or consolidation.   Electronically Signed   By: Lucienne Capers M.D.   On: 08/14/2015 03:59    EKG:  Orders placed or performed during the hospital encounter of 08/14/15  . ED EKG  .  ED EKG  . EKG 12-Lead  . EKG 12-Lead    ASSESSMENT AND PLAN:  Active Problems:   Hyperkalemia, diminished renal excretion 1. Hyperkalemia, due to renal failure, status post 2 hemodialysis sessions 24th as well as 25th of August 2016. Thirdly hemodialysis treatment is planned tomorrow 26th of August 2016, and discharge planning was started, following potassium level in the morning 2. End-stage renal disease. Patient is being planned for hemodialysis center therapy 3. Malignant essential hypertension. Resume outpatient medications 4. Anemia, likely anemia of chronic disease, seems to be stable with therapy   Management plans discussed with the patient, family and they are in agreement.   DRUG ALLERGIES:  Allergies  Allergen Reactions  . Metformin Other (See Comments)    Severe diarrhea  . Penicillins Hives    CODE STATUS:     Code Status Orders        Start     Ordered   08/14/15 0826  Full code   Continuous     08/14/15 0829      TOTAL TIME TAKING CARE OF THIS PATIENT: 40 minutes.    Theodoro Grist M.D on 08/15/2015 at 1:16 PM  Between 7am to 6pm - Pager - [phone]  After 6pm go to [email or web address] - password [email or web address] [facility]  Office  [phone]  CC: Primary care physician; Midge Min[place], [place]

## 2015-08-15 NOTE — Progress Notes (Signed)
Pre-hd tx 

## 2015-08-15 NOTE — Progress Notes (Signed)
Pre-tx hd

## 2015-08-15 NOTE — Progress Notes (Signed)
Hemodialysis tx start 

## 2015-08-15 NOTE — Care Management Note (Signed)
I met with patient one on one today for clinic selection and education.  Patient lives in [place] and has selected the new [facility] in [place].  All records for new admission are complete and have been sent to [facility]Ad[facility].  Hope to have a confirmed chair time tomorrow Friday 08/15/15.  Dr. Candiss Norse was the 1st to notify me of need for placement 08/15/15 at 12:35pm. [facility] Lia[facility] [phone]

## 2015-08-16 ENCOUNTER — Inpatient Hospital Stay: Admit: 2015-08-16 | Payer: Medicaid Other

## 2015-08-16 ENCOUNTER — Encounter: Payer: Self-pay | Admitting: Physician Assistant

## 2015-08-16 ENCOUNTER — Inpatient Hospital Stay (HOSPITAL_COMMUNITY)
Admit: 2015-08-16 | Discharge: 2015-08-16 | Disposition: A | Discharge status: Home / Self Care | Payer: Medicaid Other | Attending: Physician Assistant | Admitting: Physician Assistant

## 2015-08-16 DIAGNOSIS — R52 Pain, unspecified: Secondary | ICD-10-CM | POA: Insufficient documentation

## 2015-08-16 DIAGNOSIS — R778 Other specified abnormalities of plasma proteins: Secondary | ICD-10-CM | POA: Insufficient documentation

## 2015-08-16 DIAGNOSIS — D6489 Other specified anemias: Secondary | ICD-10-CM | POA: Insufficient documentation

## 2015-08-16 DIAGNOSIS — I313 Pericardial effusion (noninflammatory): Secondary | ICD-10-CM

## 2015-08-16 DIAGNOSIS — N19 Unspecified kidney failure: Secondary | ICD-10-CM

## 2015-08-16 DIAGNOSIS — E162 Hypoglycemia, unspecified: Secondary | ICD-10-CM | POA: Insufficient documentation

## 2015-08-16 DIAGNOSIS — E875 Hyperkalemia: Secondary | ICD-10-CM | POA: Insufficient documentation

## 2015-08-16 DIAGNOSIS — R531 Weakness: Secondary | ICD-10-CM | POA: Insufficient documentation

## 2015-08-16 DIAGNOSIS — R0789 Other chest pain: Secondary | ICD-10-CM | POA: Insufficient documentation

## 2015-08-16 DIAGNOSIS — N186 End stage renal disease: Secondary | ICD-10-CM | POA: Insufficient documentation

## 2015-08-16 DIAGNOSIS — R7989 Other specified abnormal findings of blood chemistry: Secondary | ICD-10-CM

## 2015-08-16 LAB — GLUCOSE, CAPILLARY
GLUCOSE-CAPILLARY: 130 mg/dL — AB (ref 65–99)
Glucose-Capillary: 189 mg/dL — ABNORMAL HIGH (ref 65–99)
Glucose-Capillary: 151 mg/dL — ABNORMAL HIGH (ref 65–99)
Glucose-Capillary: 218 mg/dL — ABNORMAL HIGH (ref 65–99)

## 2015-08-16 LAB — TROPONIN I: Troponin I: 0.44 ng/mL — ABNORMAL HIGH (ref <0.031)

## 2015-08-16 MED ORDER — EPOETIN ALFA 10000 UNIT/ML IJ SOLN
4000.0000 [IU] | Freq: Once | INTRAMUSCULAR | Status: AC
  Administered 2015-08-16: 4000 [IU] via SUBCUTANEOUS

## 2015-08-16 MED ORDER — ISOSORBIDE MONONITRATE ER 30 MG PO TB24
30.0000 mg | ORAL_TABLET | Freq: Every day | ORAL | Status: DC
  Administered 2015-08-16 – 2015-08-17 (×2): 30 mg via ORAL
  Filled 2015-08-16 (×2): qty 1

## 2015-08-16 MED ORDER — AMLODIPINE BESYLATE 10 MG PO TABS
10.0000 mg | ORAL_TABLET | Freq: Every day | ORAL | Status: DC
  Administered 2015-08-16: 10 mg via ORAL
  Filled 2015-08-16: qty 1

## 2015-08-16 MED ORDER — CLONIDINE HCL 0.1 MG PO TABS
0.1000 mg | ORAL_TABLET | Freq: Three times a day (TID) | ORAL | Status: DC
  Administered 2015-08-16 – 2015-08-17 (×4): 0.1 mg via ORAL
  Filled 2015-08-16 (×3): qty 1

## 2015-08-16 MED ORDER — AMLODIPINE BESYLATE 5 MG PO TABS: 5.0000 mg | ORAL_TABLET | Freq: Every day | ORAL | Status: DC

## 2015-08-16 NOTE — Progress Notes (Signed)
HD tx completed.

## 2015-08-16 NOTE — Progress Notes (Signed)
*  PRELIMINARY RESULTS* Echocardiogram 2D Echocardiogram has been performed.  Richard Zavala 08/16/2015, 7:12 PM

## 2015-08-16 NOTE — Progress Notes (Signed)
Patient had third treatment of dialysis today. Still currently off the floor. Did not give morning medications per dialysis protocol other than BP meds since pressure has been high. Unaware that patient would go so late to dialysis, so patient did not receive daily medications today. Will update night shift. Patient is currently getting ECHO completed before coming back to his room. Has been alert and oriented. Has been able to urinate today, bladder scan at 12 only showing 238. Blood pressure remains high in dialysis. Patient will take some new BP meds at bedtime tonight. Will continue to monitor.

## 2015-08-16 NOTE — Care Management (Signed)
Outpatient dialysis referral is in progress.  Patient denies need for assistance with transportation.  Says that one of his adult children will transport.  Patient says he does not have medicaid transportation and has never applied.  He denies need for any in home follow up.

## 2015-08-16 NOTE — Progress Notes (Signed)
Pre-hd tx 

## 2015-08-16 NOTE — Consult Note (Addendum)
Cardiology Consultation Note  Patient ID: Richard Zavala, MRN: [id number], DOB/AGE: 03/03/1961 54 y.o. Admit date: 08/14/2015   Date of Consult: 08/16/2015 Primary Physician: Midge Minium, PA Primary Cardiologist: [facility]  Chief Complaint: Generalized malaise, fatigue, diarrhea, s/p fall with left sided rib injury and chest wall pain Reason for Consult: Elevated troponin   HPI: 54 y.o. male with h/o 3 vessel CAD medically managed by cardiac cath in 2014 given poor surgical targets, history of chronic systolic CHF/ischemic cardiomyopathy with improved ejection fraction to 50% by last echo in 123456, chronic diastolic HF, CKD stage IV to V approaching HD, history of stroke, chronic anemia, blindness, HTN, and HLD who presented to [facility] on 8/24 with a 5 day history of decreased PO intake, generalized malaise, fatigue, diarrhea, s/p suffering a fall and hitting the left side of his chest wall on his bed frame. Cardiology is consulted for mildly elevated troponin.   He was first noted to have a cardiomyopathy in 2014 when he presented with ADHF. Angiography showed 3 VD not amenable to surgical revascularization and has been managed medically though [facility] since and done well. Echo in 2014 showed EF 35%, GR3-4 DD, apical wall AK, mild TR/MR, RV pressure 41 mm Hg. On medical therapy his echo improved to EF 50% in 2015. He was hospitalized in in September 2015 for acute left MCA stroke, possible embolic. Outpatient cardiac monitoring was performed, though these records are not visible on [place]. He has been noted to have significantly elevated BP in the outpatient setting.   He presented to [facility] on 8/24 with 5 day history of decreased PO intake, generalized malaise, fatigue, diarrhea, s/p suffering a fall and hitting the left side of his chest wall on his bed frame. At the time of his presentation his chest pain had resolved fully. Chest pain last for 2 days and was located at the site of impact  on his bed frame from his fall. No associated SOB, nausea, vomiting, presyncope, or syncope.   Upon his arrival he was found to be hyperkalemic at 6.1 s/p Kayexalate, insulin, bicarb, and Calcium gluconate potassium, and HD corrected to 4.4 on 8/25. He was also found to have a troponin of 0.05-->0.65-->0.64-->0.44. HGB 6.5-->6.3-->7.4-->7.5. CXR no acute process. Dedicated ribs films were not done. This morning he has no complaints.     Past Medical History  Diagnosis Date  . Diabetes mellitus without complication   . Hypertension   . Chronic kidney disease (CKD), stage IV (severe)   . CHF (congestive heart failure)   . Hyperlipemia   . Osteoporosis   . Anemia   . TIA (transient ischemic attack)   . Stroke   . Wilms' tumor   . CAD (coronary artery disease)   . Pulmonary HTN   . TRD (traction retinal detachment)   . Blind       Most Recent Cardiac Studies: Echo 2015:  ECHOCARDIOGRAPHIC DESCRIPTIONS ----------------------------------------------- AORTIC ROOT Size: Not seen Dissection: INDETERM FOR DISSECTION AORTIC VALVE Leaflets: Tricuspid Morphology: Normal Mobility: Fully Mobile LEFT VENTRICLE Anterior: Normal Size: Normal Lateral: Normal Contraction: REGIONALLY IMPAIRED Septal: Normal Closest EF: 50% (Estimated) Calc.EF: 52% Apical: HYPOCONTRACTILE LV masses: No Masses Inferior: Normal LVH: SEVERE LVH CONCENTRIC Posterior: Normal LV Note: GLSS= -11.7% Dias.FxClass: RELAXATION ABNORMALITY (GRADE 1) CORRESPONDS TO E/A REVERSAL MITRAL VALVE Leaflets: Normal Mobility: Fully mobile Morphology: Normal LEFT ATRIUM Size: SEVERELY ENLARGED LA masses: No masses Normal IAS MAIN PA Size: Not seen PULMONIC VALVE Morphology: Normal Mobility: Fully  Mobile RIGHT VENTRICLE Size: Normal Free wall: Normal Contraction: Normal RV masses: No Masses TAPSE: 2.9 cm, Normal Range [>= 1.6 cm] RV Note: Systolic TV annular [id number] TRICUSPID VALVE Leaflets: Normal  Mobility: Fully mobile Morphology: Normal RIGHT ATRIUM Size: MILDLY ENLARGED RA Other: None RA masses: No masses RA Note: Area=25cm2 PERICARDIUM Fluid: MILD EFFUSION UNIFORM INFERIOR VENACAVA Size: DILATED Normal respiratory collapse DOPPLER ECHO and OTHER SPECIAL PROCEDURES ------------------------------------ Aortic: No AR No AS Mitral: MILD MR No MS MV Inflow E Vel.= 80.0 cm/s MV Annulus E'Vel.= 5.0 cm/s E/E'Ratio= 16 Tricuspid: TRIVIAL TR No TS Pulmonary: No PR No PS Other: INTERPRETATION --------------------------------------------------------------- MILD LV DYSFUNCTION (See above) WITH SEVERE LVH NORMAL RIGHT VENTRICULAR SYSTOLIC FUNCTION VALVULAR REGURGITATION: MILD MR, TRIVIAL TR NO VALVULAR STENOSIS SMALL PERICARDIAL EFFUSION (See above) NO LV THROMBUS SEEN IN SETTING OF LV APICAL HYPOKINESIS. PAST NEGATIVE SALINE CONTRAST STUDY 06/2012   Cardiac cath 2014:  Right Coronary Artery Prox RCA 50% extending Diffuse to Mid RCA *Dist RCA 80% Diffuse *R PDA 80% Discrete TIMI 0 *R PDA 100% Discrete TIMI 0 Left Main L Main 50% Discrete Left Circumflex *Prox LCX 70% extending Diffuse to Mid LCX Mid LCX 30% Discrete Mid LCX 30% Discrete OM3 (Small) Left Anterior Descending Mid LAD 50% Diffuse *Mid LAD 90% Discrete Dist LAD 50% Diffuse D2 (Small) * Denotes significant lesion  A-V O2 Difference = 4.21 Vol %, PVR = 1.5 wood units, Cardiac Index = 3.03 L/min/m2, SBF (Qs) = 6.0 L/min, RA 14/12 mean 11, RV 45 8 -- 12, PA 46/17 mean 32, PCW 23  3-vessel coronary artery disease Preserved CO/CI in setting of severe anemia Diffuse disease with poor distal surgical targets, so surgical revasc would not be the best option So would consider medical therapy versus staged PCI of the LAD - for PCI option, we will need to implant 4cm of stent, procedure will require 100-150 cc contrast  Echo 2014:  Echocardiogram: EF 35%. Moderate LVH conc. Diastolic Fx Class: Restrictive  Filling pattern (Grade 3-4) indeterminate for reversibility. Apical wall akinetic. RA mildly enlarged. Mild MR. Trivial TR 2.9 m/s peak TR vel 41 mmHg peak RV pressure   Surgical History:  Past Surgical History  Procedure Laterality Date  . Nephrectomy radical    . Eye surgery    . Hip surgery    . Av fistula placement       Home Meds: Prior to Admission medications   Medication Sig Start Date End Date Taking? Authorizing Provider  aspirin EC 81 MG tablet Take 81 mg by mouth daily.   Yes Historical Provider, MD  atorvastatin (LIPITOR) 80 MG tablet Take 80 mg by mouth daily.   Yes Historical Provider, MD  calcitRIOL (ROCALTROL) 0.25 MCG capsule Take 1 capsule by mouth daily. 04/03/15 04/02/16 Yes Historical Provider, MD  calcium acetate (PHOSLO) 667 MG capsule Take 2 capsules by mouth 3 (three) times daily. 07/01/15 06/30/16 Yes Historical Provider, MD  carvedilol (COREG) 12.5 MG tablet Take 37.5 mg by mouth 2 (two) times daily.   Yes Historical Provider, MD  Cholecalciferol (D 2000) 2000 UNITS TABS Take 1,000 mg by mouth daily.   Yes Historical Provider, MD  ferrous sulfate 325 (65 FE) MG tablet Take 325 mg by mouth every morning.   Yes Historical Provider, MD  furosemide (LASIX) 40 MG tablet Take 40 mg by mouth daily.   Yes Historical Provider, MD  hydrochlorothiazide (HYDRODIURIL) 25 MG tablet Take 25 mg by mouth 2 (two) times daily.   Yes Historical  Provider, MD  insulin glargine (LANTUS) 100 UNIT/ML injection Inject 17 Units into the skin at bedtime.   Yes Historical Provider, MD  tamsulosin (FLOMAX) 0.4 MG CAPS capsule Take 1 capsule by mouth daily. 07/01/15 06/30/16 Yes Historical Provider, MD    Inpatient Medications:  . sodium chloride   Intravenous Once  . sodium chloride   Intravenous Once  . amLODipine  5 mg Oral QHS  . aspirin EC  81 mg Oral Daily  . atorvastatin  80 mg Oral Daily  . calcitRIOL  0.25 mcg Oral Daily  . calcium acetate  1,334 mg Oral TID  . carvedilol  37.5 mg  Oral BID  . cholecalciferol  2,000 Units Oral Daily  . cloNIDine  0.1 mg Oral TID  . epoetin (EPOGEN/PROCRIT) injection  4,000 Units Intravenous Q T,Th,Sa-HD  . epoetin (EPOGEN/PROCRIT) injection  4,000 Units Subcutaneous Once  . ferrous sulfate  325 mg Oral q morning - 10a  . furosemide  40 mg Oral Daily  . haloperidol lactate  1 mg Intravenous Once  . insulin aspart  0-9 Units Subcutaneous TID WC  . losartan  100 mg Oral QHS  . nitroGLYCERIN  1 inch Topical 4 times per day  . sodium chloride  3 mL Intravenous Q12H  . tamsulosin  0.4 mg Oral Daily      Allergies:  Allergies  Allergen Reactions  . Metformin Other (See Comments)    Severe diarrhea  . Penicillins Hives    Social History   Social History  . Marital Status: Married    Spouse Name: N/A  . Number of Children: N/A  . Years of Education: N/A   Occupational History  . Not on file.   Social History Main Topics  . Smoking status: Former Research scientist (life sciences)  . Smokeless tobacco: Not on file  . Alcohol Use: Not on file  . Drug Use: Not on file  . Sexual Activity: Not on file   Other Topics Concern  . Not on file   Social History Narrative     Family History  Problem Relation Age of Onset  . CAD Father   . COPD Father   . CAD Paternal Grandfather   . Diabetes Maternal Aunt   . Diabetes Maternal Uncle      Review of Systems: Review of Systems  Constitutional: Positive for malaise/fatigue. Negative for fever, chills, weight loss and diaphoresis.  HENT: Negative for congestion.   Eyes: Negative for discharge and redness.  Respiratory: Negative for cough, hemoptysis, sputum production, shortness of breath and wheezing.   Cardiovascular: Positive for chest pain. Negative for palpitations, orthopnea, claudication, leg swelling and PND.       2/2 fall per patient   Gastrointestinal: Negative for heartburn, nausea, vomiting and abdominal pain.  Musculoskeletal: Positive for myalgias, joint pain, falls and neck pain.    Skin: Negative for rash.  Neurological: Positive for weakness. Negative for sensory change, speech change, focal weakness and loss of consciousness.  Endo/Heme/Allergies: Does not bruise/bleed easily.  Psychiatric/Behavioral: Negative for substance abuse. The patient is not nervous/anxious.   All other systems reviewed and are negative.   Labs:  Recent Labs  08/14/15 0347 08/15/15 1334 08/15/15 2119 08/16/15 0526  TROPONINI 0.05* 0.65* 0.64* 0.44*   Lab Results  Component Value Date   WBC 9.0 08/15/2015   HGB 7.5* 08/15/2015   HCT 23.2* 08/15/2015   MCV 79.9* 08/15/2015   PLT 143* 08/15/2015    Recent Labs Lab 08/14/15 0347  08/15/15 0455  NA 133*  --  138  K 6.1*  < > 4.4  CL 114*  --  110  CO2 10*  --  18*  BUN 63*  --  96*  CREATININE 10.34*  --  8.40*  CALCIUM 7.4*  --  7.7*  PROT 6.1*  --   --   BILITOT 0.4  --   --   ALKPHOS 74  --   --   ALT 16*  --   --   AST 19  --   --   GLUCOSE 54*  --  102*  < > = values in this interval not displayed. No results found for: CHOL, HDL, LDLCALC, TRIG No results found for: DDIMER  Radiology/Studies:  Dg Neck Soft Tissue  08/14/2015   CLINICAL DATA:  Increasing pain.  History of stroke.  EXAM: NECK SOFT TISSUES - 1+ VIEW  COMPARISON:  CT 11 06/2012.  FINDINGS: Epiglottis normal. Retropharyngeal space normal. Cervical airway widely patent. Degenerative changes cervical spine loss of normal cervical lordosis. No acute bony abnormality identified. Carotid atherosclerotic vascular calcification.  IMPRESSION: 1. Carotid atherosclerotic vascular disease. 2. Soft tissues of the neck are otherwise unremarkable. Degenerative changes cervical spine.   Electronically Signed   By: [place]   On: 08/14/2015 14:45   Dg Chest 2 View  08/14/2015   CLINICAL DATA:  Rib pain and weakness. Diarrhea for a month. History of diabetes, congestive heart failure, and renal failure.  EXAM: CHEST  2 VIEW  COMPARISON:  None.  FINDINGS:  Borderline heart size and pulmonary vascularity. No edema or consolidation in the lungs. No blunting of costophrenic angles. No pneumothorax. Mediastinal contours appear intact. Degenerative changes in the spine and shoulders.  IMPRESSION: Borderline heart size and pulmonary vascularity. No edema or consolidation.   Electronically Signed   By: Lucienne Capers M.D.   On: 08/14/2015 03:59    EKG: sinus bradycardia, 59 bpm, no significant st/t changes   Weights: Filed Weights   08/15/15 0930 08/15/15 1221 08/15/15 1246  Weight: 176 lb 12.9 oz (80.2 kg) 175 lb 14.8 oz (79.8 kg) 175 lb 14.4 oz (79.788 kg)     Physical Exam: Blood pressure 179/75, pulse 69, temperature 98.3 F (36.8 C), temperature source Oral, resp. rate 18, height 5\' 8"  (1.727 m), weight 175 lb 14.4 oz (79.788 kg), SpO2 97 %. Body mass index is 26.75 kg/(m^2). General: Well developed, well nourished, in no acute distress. Head: Normocephalic, atraumatic, sclera non-icteric, no xanthomas, nares are without discharge.  Neck: Negative for carotid bruits. JVD not elevated. Lungs: Clear bilaterally to auscultation without wheezes, rales, or rhonchi. Breathing is unlabored. Heart: RRR with S1 S2. No murmurs, rubs, or gallops appreciated. Abdomen: Soft, non-tender, non-distended with normoactive bowel sounds. No hepatomegaly. No rebound/guarding. No obvious abdominal masses. Msk:  Strength and tone appear normal for age. Extremities: No clubbing or cyanosis. No edema.  Distal pedal pulses are 2+ and equal bilaterally. Neuro: Alert and oriented X 3. No facial asymmetry. No focal deficit. Moves all extremities spontaneously. Psych:  Responds to questions appropriately with a normal affect.    Assessment and Plan:  54 y.o. male with h/o 3 vessel CAD medically managed by cardiac cath in 2014 given poor surgical targets, history of chronic systolic CHF/ischemic cardiomyopathy with improved ejection fraction to 50% by last echo in 2015,  chronic diastolic HF, CKD stage IV to V approaching HD, history of stroke, chronic anemia, blindness, HTN, and HLD who presented to [facility] on 8/24  with a 5 day history of decreased PO intake, generalized malaise, fatigue, diarrhea, s/p suffering a fall and hitting the left side of his chest wall on his bed frame. Cardiology is consulted for mildly elevated troponin.  1. Elevated troponin: -Likely demand ischemia in the setting of his significantly elevated blood pressure of 123456 systolic and CKD stage VI to V -Chest wall pain did not occur until after his fall. He is asymptomatic at this time x 3 days -Would check echo to evaluate LV function and wall motion to compare to prior study in 2015. If stable would not pursue any further ischemic work up at this time. If echo shows newly depressed EF vs new wall motion abnormality could follow up with ischemic evaluation  -No heparin gtt at this time  2. 3 vessel CAD: -Medically managed previously at [facility] as he is not a surgical candidate given poor targets -Per their notes, could consider staged PCI, though that was in 2014 and this has not been undertaken  -If echo is stable would leave possible stage PCI up to primary cardiology service, [facility] -Continue aspirin 81 mg, losartan 100 mg, Lipitor 80 mg  3. HTN: -Long history of accelerated HTN -Poorly controlled currently -Likely contributing to #1 -Renal has made some adjustments -Could also discontinue nitro paste and place him on Imdur 30 mg daily -Continue modifications made by renal and follow BP (do not want too many cooks in the kitchen at the same time)  4. Ischemic cardiomyopathy: -Last EF 50% in 2015 -Check echo as above -Continue current medications  5. CKD stage VI to V: -HD per renal  6. Hyperkalemia: -Resolved -HD per renal  Signed, Christell Faith, PA-C Pager: [phone] 08/16/2015, 12:07 PM   Attending Note Patient seen and examined, agree with detailed note above,   Patient presentation and plan discussed on rounds.   Patient presenting with weakness, malaise, Elevated troponin noted on arrival which has trended downwards Traumatic trauma to his left chest could explain his left side chest pain Chronic anemia with hematocrit 23  --Prior cardiac history with workup at [facility], not a candidate for intervention at this time --Not a good candidate for cardiac catheterization at this time given severe renal dysfunction, though he still makes urine. Contrast load would potentially make renal function worse and accelerate need for permanent dialysis. --As he is asymptomatic, enzymes trending downward, anemic, would perform medical management at this time. --Record and follow-up with his cardiologist at [facility] --Close monitoring of his blood pressure. Nitroglycerin paste can be weaned as tolerated  Signed: Esmond Plants  M.D., Ph.D.

## 2015-08-16 NOTE — Progress Notes (Addendum)
[facility] [place] NAME: Richard Zavala    MR#:  [id number]  DATE OF BIRTH:  1961/12/04  SUBJECTIVE:  CHIEF COMPLAINT:   Chief Complaint  Patient presents with  . Diarrhea  . Rib Injury  . Nasal Congestion   patient is 54 year old Caucasian male with history of CK D who presents to the hospital with diarrhea, weakness, generalized body pains as well as nausea and decreased oral intake for the past 2 days. In emergency room, he was noted to have high potassium levels and was admitted to the hospital, he underwent hemodialysis yesterday 24th of August 2016 and is undergoing hemodialysis today 25th of August 2016. He feels comfortable, although admited of having pain between his shoulders and upper back day before yesterday, some pain in the left side of the chest after fall. Denies shortness of breath, feels quite comfortable today. Blood pressure is still very high at around 200 in  Morning today. Third hemodialysis session today.   Review of Systems  Constitutional: Negative for fever, chills and weight loss.  HENT: Negative for congestion.   Eyes: Negative for blurred vision and double vision.  Respiratory: Negative for cough, sputum production, shortness of breath and wheezing.   Cardiovascular: Negative for chest pain, palpitations, orthopnea, leg swelling and PND.  Gastrointestinal: Negative for nausea, vomiting, abdominal pain, diarrhea, constipation and blood in stool.  Genitourinary: Negative for dysuria, urgency, frequency and hematuria.  Musculoskeletal: Positive for back pain and neck pain. Negative for falls.  Neurological: Negative for dizziness, tremors, focal weakness and headaches.  Endo/Heme/Allergies: Does not bruise/bleed easily.  Psychiatric/Behavioral: Negative for depression. The patient does not have insomnia.     VITAL SIGNS: Blood pressure 166/64, pulse 67, temperature 98.3 F (36.8 C), temperature source Oral,  resp. rate 18, height 5\' 8"  (1.727 m), weight 79.788 kg (175 lb 14.4 oz), SpO2 97 %.  PHYSICAL EXAMINATION:   GENERAL:  54 y.o.-year-old patient lying in the bed with no acute distress , laying in bed.  EYES: Pupils equal, round, reactive to light and accommodation. No scleral icterus. Extraocular muscles intact.  HEENT: Head atraumatic, normocephalic. Oropharynx and nasopharynx clear.  NECK:  Supple, no jugular venous distention. No thyroid enlargement, no tenderness.  LUNGS: Normal breath sounds bilaterally, no wheezing, rales, few rhonchi , no crepitation. No use of accessory muscles of respiration.  CARDIOVASCULAR: S1, S2 normal. No murmurs, rubs, or gallops.  ABDOMEN: Soft, nontender, nondistended. Bowel sounds present. No organomegaly or mass.  EXTREMITIES: No pedal edema, cyanosis, or clubbing.  NEUROLOGIC: Cranial nerves II through XII are intact. Muscle strength 5/5 in all extremities. Sensation intact. Gait not checked.  PSYCHIATRIC: The patient is alert and oriented x 3.  SKIN: No obvious rash, lesion, or ulcer.   ORDERS/RESULTS REVIEWED:   CBC  Recent Labs Lab 08/14/15 0347 08/14/15 1158 08/14/15 1920 08/15/15 0455  WBC 6.6  --   --  9.0  HGB 6.5* 6.3* 7.4* 7.5*  HCT 21.3* 20.7* 23.2* 23.2*  PLT 141*  --   --  143*  MCV 82.5  --   --  79.9*  MCH 25.0*  --   --  25.7*  MCHC 30.3*  --   --  32.2  RDW 17.9*  --   --  17.9*   ------------------------------------------------------------------------------------------------------------------  Chemistries   Recent Labs Lab 08/14/15 0347 08/14/15 1158 08/14/15 1515 08/15/15 0455  NA 133*  --   --  138  K 6.1* 6.1*  3.9 4.4  CL 114*  --   --  110  CO2 10*  --   --  18*  GLUCOSE 54*  --   --  102*  BUN 63*  --   --  96*  CREATININE 10.34*  --   --  8.40*  CALCIUM 7.4*  --   --  7.7*  AST 19  --   --   --   ALT 16*  --   --   --   ALKPHOS 74  --   --   --   BILITOT 0.4  --   --   --     ------------------------------------------------------------------------------------------------------------------ estimated creatinine clearance is 10 mL/min (by C-G formula based on Cr of 8.4). ------------------------------------------------------------------------------------------------------------------ No results for input(s): TSH, T4TOTAL, T3FREE, THYROIDAB in the last 72 hours.  Invalid input(s): FREET3  Cardiac Enzymes  Recent Labs Lab 08/15/15 1334 08/15/15 2119 08/16/15 0526  TROPONINI 0.65* 0.64* 0.44*   ------------------------------------------------------------------------------------------------------------------ Invalid input(s): POCBNP ---------------------------------------------------------------------------------------------------------------  RADIOLOGY: Dg Neck Soft Tissue  08/14/2015   CLINICAL DATA:  Increasing pain.  History of stroke.  EXAM: NECK SOFT TISSUES - 1+ VIEW  COMPARISON:  CT 11 06/2012.  FINDINGS: Epiglottis normal. Retropharyngeal space normal. Cervical airway widely patent. Degenerative changes cervical spine loss of normal cervical lordosis. No acute bony abnormality identified. Carotid atherosclerotic vascular calcification.  IMPRESSION: 1. Carotid atherosclerotic vascular disease. 2. Soft tissues of the neck are otherwise unremarkable. Degenerative changes cervical spine.   Electronically Signed   By: Marcello Moores  Register   On: 08/14/2015 14:45    EKG:  Orders placed or performed during the hospital encounter of 08/14/15  . ED EKG  . ED EKG  . EKG 12-Lead  . EKG 12-Lead    ASSESSMENT AND PLAN:  Active Problems:   Hyperkalemia, diminished renal excretion   Anemia due to other cause   Hyperkalemia   Hypoglycemia   Pain   Renal failure   Weakness   Other chest pain   Elevated troponin 1. Hyperkalemia, due to renal failure, status post 2 hemodialysis sessions 24th as well as 25th of August 2016. Third hemodialysis treatment is planned   Today, 26th of August 2016, discharge planning is started, following potassium level in the morning. Care management is working to provide assistance with transportation to dialysis Center.  2. End-stage renal disease. Patient is being planned for outpatient hemodialysis center therapy.  3. Malignant essential hypertension. Resumed outpatient medications, the pressure is still very high at 200 in the morning. We will be adding 10 mg of Norvasc at nighttime , change nitroglycerin topically to Imdur  4. Anemia, likely anemia of chronic disease, seems to be stable with therapy  5. Repeated troponin, likely demand ischemia due to malignant essential hypertension. Continue patient on aspirin, Coreg, Imdur, Cozaar, as well as Lipitor, radiology consultation is appreciated. Patient will have echocardiogram done and decision will be made in regards to further workup. Discussed with cardiologist today.    Management plans discussed with the patient, family and they are in agreement.   DRUG ALLERGIES:  Allergies  Allergen Reactions  . Metformin Other (See Comments)    Severe diarrhea  . Penicillins Hives    CODE STATUS:     Code Status Orders        Start     Ordered   08/14/15 0826  Full code   Continuous     08/14/15 0829      TOTAL TIME TAKING CARE OF THIS PATIENT:  30 minutes.    Theodoro Grist M.D on 08/16/2015 at 2:31 PM  Between 7am to 6pm - Pager - [phone]  After 6pm go to [email or web address] - password [email or web address] [facility]  Office  [phone]  CC: Primary care physician; Midge Minium, [place]

## 2015-08-16 NOTE — Progress Notes (Signed)
Bladder scan done on patient since he has been going to the restroom and can't urinate. Obtained 517mL. DR diamond notified and received an order for in and out cath. In and out cath done and obtained 400 cc. Paged Dr. Marcille Blanco two times with a call back but still has not put an order for  the BP me in yet, still waiting.

## 2015-08-16 NOTE — Progress Notes (Signed)
Post hd note

## 2015-08-16 NOTE — Progress Notes (Signed)
Post hd tx 

## 2015-08-16 NOTE — Progress Notes (Signed)
Inpatient Diabetes Program Recommendations  AACE/ADA: New Consensus Statement on Inpatient Glycemic Control (2013)  Target Ranges:  Prepandial:   less than 140 mg/dL      Peak postprandial:   less than 180 mg/dL (1-2 hours)      Critically ill patients:  140 - 180 mg/dL   Reason for Visit:attempted to visit with this patient for the third time this week.  He was being transported to hemodialysis as I arrived.  Patient reports taking Lantus insulin 17 units qhs as ordered.  Richard Fitz, RN, BA, MHA, CDE Diabetes Coordinator Inpatient Diabetes Program  [phone] (Team Pager) [phone] ([place]) 08/16/2015 2:36 PM

## 2015-08-16 NOTE — Progress Notes (Signed)
Subjective:  Doing well this morning Having some issues with urinary retention In and out catheter done this morning. 400 cc obtained Blood pressure high this morning Added clonidine to his regimen     Objective:  Vital signs in last 24 hours:  Temp:  [98 F (36.7 C)-99.3 F (37.4 C)] 98.3 F (36.8 C) (08/26 1104) Pulse Rate:  [67-77] 69 (08/26 1104) Resp:  [17-20] 18 (08/26 1104) BP: (135-206)/(58-82) 179/75 mmHg (08/26 1104) SpO2:  [92 %-97 %] 97 % (08/26 1104) Weight:  [79.788 kg (175 lb 14.4 oz)-79.8 kg (175 lb 14.8 oz)] 79.788 kg (175 lb 14.4 oz) (08/25 1246)  Weight change: 5.4 kg (11 lb 14.5 oz) Filed Weights   08/15/15 0930 08/15/15 1221 08/15/15 1246  Weight: 80.2 kg (176 lb 12.9 oz) 79.8 kg (175 lb 14.8 oz) 79.788 kg (175 lb 14.4 oz)    Intake/Output:    Intake/Output Summary (Last 24 hours) at 08/16/15 1121 Last data filed at 08/16/15 0830  Gross per 24 hour  Intake    240 ml  Output   1800 ml  Net  -1560 ml     Physical Exam: General: NAD, laying in bed  HEENT  moist mucous membranes   Neck supple  Pulm/lungs  normal effort, clear bilaterally   CVS/Heart  no rub or gallop, soft systolic murmur   Abdomen:   soft, nontender   Extremities:  trace peripheral edema   Neurologic:  alert, followed commands   Skin:  no acute rashes   Access:  AV fistula        Basic Metabolic Panel:   Recent Labs Lab 08/14/15 0347 08/14/15 1158 08/14/15 1515 08/15/15 0455  NA 133*  --   --  138  K 6.1* 6.1* 3.9 4.4  CL 114*  --   --  110  CO2 10*  --   --  18*  GLUCOSE 54*  --   --  102*  BUN 63*  --   --  96*  CREATININE 10.34*  --   --  8.40*  CALCIUM 7.4*  --   --  7.7*     CBC:  Recent Labs Lab 08/14/15 0347 08/14/15 1158 08/14/15 1920 08/15/15 0455  WBC 6.6  --   --  9.0  HGB 6.5* 6.3* 7.4* 7.5*  HCT 21.3* 20.7* 23.2* 23.2*  MCV 82.5  --   --  79.9*  PLT 141*  --   --  143*      Microbiology:  Recent Results (from the past 720  hour(s))  MRSA PCR Screening     Status: None   Collection Time: 08/14/15  8:00 PM  Result Value Ref Range Status   MRSA by PCR NEGATIVE NEGATIVE Final    Comment:        The GeneXpert MRSA Assay (FDA approved for NASAL specimens only), is one component of a comprehensive MRSA colonization surveillance program. It is not intended to diagnose MRSA infection nor to guide or monitor treatment for MRSA infections.     Coagulation Studies: No results for input(s): LABPROT, INR in the last 72 hours.  Urinalysis: No results for input(s): COLORURINE, LABSPEC, PHURINE, GLUCOSEU, HGBUR, BILIRUBINUR, KETONESUR, PROTEINUR, UROBILINOGEN, NITRITE, LEUKOCYTESUR in the last 72 hours.  Invalid input(s): APPERANCEUR    Imaging: Dg Neck Soft Tissue  08/14/2015   CLINICAL DATA:  Increasing pain.  History of stroke.  EXAM: NECK SOFT TISSUES - 1+ VIEW  COMPARISON:  CT 11 06/2012.  FINDINGS: Epiglottis normal.  Retropharyngeal space normal. Cervical airway widely patent. Degenerative changes cervical spine loss of normal cervical lordosis. No acute bony abnormality identified. Carotid atherosclerotic vascular calcification.  IMPRESSION: 1. Carotid atherosclerotic vascular disease. 2. Soft tissues of the neck are otherwise unremarkable. Degenerative changes cervical spine.   Electronically Signed   By: Marcello Moores  Register   On: 08/14/2015 14:45     Medications:     . sodium chloride   Intravenous Once  . sodium chloride   Intravenous Once  . amLODipine  5 mg Oral QHS  . aspirin EC  81 mg Oral Daily  . atorvastatin  80 mg Oral Daily  . calcitRIOL  0.25 mcg Oral Daily  . calcium acetate  1,334 mg Oral TID  . carvedilol  37.5 mg Oral BID  . cholecalciferol  2,000 Units Oral Daily  . cloNIDine  0.1 mg Oral TID  . epoetin (EPOGEN/PROCRIT) injection  4,000 Units Intravenous Q T,Th,Sa-HD  . ferrous sulfate  325 mg Oral q morning - 10a  . furosemide  40 mg Oral Daily  . haloperidol lactate  1 mg  Intravenous Once  . insulin aspart  0-9 Units Subcutaneous TID WC  . losartan  100 mg Oral QHS  . nitroGLYCERIN  1 inch Topical 4 times per day  . sodium chloride  3 mL Intravenous Q12H  . tamsulosin  0.4 mg Oral Daily   sodium chloride, acetaminophen **OR** acetaminophen, ondansetron **OR** ondansetron (ZOFRAN) IV, oxyCODONE-acetaminophen, senna-docusate, sodium chloride  Assessment/ Plan:  54 y.o. male with medical problems of long-standing diabetes with complications of neuropathy, retinopathy coronary disease with history of heart catheterization in the past, BPH, left nephrectomy in 1971 due to Wilms tumor, pulmonary hypertension, congestive heart failure, anemia, hypertension , was admitted on 08/14/2015 with fatigue, poor appetite, worsening of renal failure and severe anemia.  1. End-stage renal disease. Likely secondary to diabetic nephropathy - started on dialysis via his left forearm AV fistula - #3 treatment today. -  discharge planning  - 3.5 hr treatment today 2. Anemia of chronic kidney disease - start Procrit  3. Secondary hyperparathyroidism - Dietary counseling - Continue PhosLo 4. Diabetes type 2 with CKD - Hemoglobin A1c 8.3% 5. Hyperkalemia -  Improved with dialysis 6. HTN - start ARB- losartan, clonidine   LOS: 2 Dillon Mcreynolds 8/26/201611:21 AM

## 2015-08-16 NOTE — Progress Notes (Signed)
Patient very upset this morning due to the fact he has bed alarm on and also staff will not leave him alone when he goes to the restroom to urinate. The RN explain to the patient that the staff is doing that for his safety. BP at 0300 was 189/73. Patient received NTG paste and on rechecked BP has gone up to 201/80. Dr. Marcille Blanco notified and he stated he was going to put some PRN BP med in the system. Awaiting for the medication to be administered.

## 2015-08-17 LAB — GLUCOSE, CAPILLARY
GLUCOSE-CAPILLARY: 170 mg/dL — AB (ref 65–99)
GLUCOSE-CAPILLARY: 195 mg/dL — AB (ref 65–99)

## 2015-08-17 LAB — HEMOGLOBIN: Hemoglobin: 7.3 g/dL — ABNORMAL LOW (ref 13.0–18.0)

## 2015-08-17 MED ORDER — ISOSORBIDE MONONITRATE ER 30 MG PO TB24: 30.0000 mg | ORAL_TABLET | Freq: Every day | ORAL | Status: DC

## 2015-08-17 MED ORDER — LOSARTAN POTASSIUM 100 MG PO TABS: 100.0000 mg | ORAL_TABLET | Freq: Every day | ORAL | Status: DC

## 2015-08-17 MED ORDER — OXYCODONE-ACETAMINOPHEN 5-325 MG PO TABS: 1.0000 | ORAL_TABLET | Freq: Four times a day (QID) | ORAL | Status: DC | PRN

## 2015-08-17 MED ORDER — CLONIDINE HCL 0.1 MG PO TABS: 0.1000 mg | ORAL_TABLET | Freq: Three times a day (TID) | ORAL | Status: DC

## 2015-08-17 MED ORDER — AMLODIPINE BESYLATE 10 MG PO TABS: 10.0000 mg | ORAL_TABLET | Freq: Every day | ORAL | Status: DC

## 2015-08-17 NOTE — Progress Notes (Signed)
    SUBJECTIVE:  No chest pain.  No SOB   PHYSICAL EXAM Filed Vitals:   08/16/15 1800 08/16/15 2004 08/17/15 0508 08/17/15 1121  BP: 198/76 156/58 156/65 168/66  Pulse: 70 69 65 66  Temp: 98.2 F (36.8 C) 98.3 F (36.8 C) 98.3 F (36.8 C) 98.5 F (36.9 C)  TempSrc: Oral Oral Oral Oral  Resp: 18 18 18 16   Height:      Weight: 176 lb 5.9 oz (80 kg)     SpO2: 98% 98% 97% 96%   General:  No distress Lungs:  Clear Heart:  RRR Abdomen:  Positive bowel sounds, no rebound no guarding Extremities:  No edema   LABS: Lab Results  Component Value Date   TROPONINI 0.44* 08/16/2015   Results for orders placed or performed during the hospital encounter of 08/14/15 (from the past 24 hour(s))  Glucose, capillary     Status: Abnormal   Collection Time: 08/16/15  8:06 PM  Result Value Ref Range   Glucose-Capillary 218 (H) 65 - 99 mg/dL  Hemoglobin     Status: Abnormal   Collection Time: 08/17/15  4:33 AM  Result Value Ref Range   Hemoglobin 7.3 (L) 13.0 - 18.0 g/dL  Glucose, capillary     Status: Abnormal   Collection Time: 08/17/15  7:28 AM  Result Value Ref Range   Glucose-Capillary 170 (H) 65 - 99 mg/dL   Comment 1 Notify RN    Comment 2 Document in Chart   Glucose, capillary     Status: Abnormal   Collection Time: 08/17/15 11:23 AM  Result Value Ref Range   Glucose-Capillary 195 (H) 65 - 99 mg/dL   Comment 1 Notify RN    Comment 2 Document in Chart     Intake/Output Summary (Last 24 hours) at 08/17/15 1348 Last data filed at 08/17/15 1100  Gross per 24 hour  Intake      0 ml  Output    850 ml  Net   -850 ml     ASSESSMENT AND PLAN:  ELEVATED TROPONIN:   Felt to be demand ischemia.  Echo results pending.  Troponin trend is flat and would not suggest an acute coronary syndrome.  Doubt that further cardiovascular testing will be indicated.    CAD:  Medical management as above.   HTN:  He is on significant medical therapy.  If BP remains high we could add  hydralazine.  Continue current therapy for now.     Jeneen Rinks [facility] 08/17/2015 1:48 PM

## 2015-08-17 NOTE — Progress Notes (Signed)
Patient does not want to have bladder scan done. Will discharge today. Per MD let patient follow up with urology outpatient and continue taking flomax. Will attempt to let patient urinate before leaving.

## 2015-08-17 NOTE — Progress Notes (Signed)
Obtained  438 mL when bladder scan was performed on the patient. Patient refused in and out cath stated, "I don't want to cath anymore." Will try again later. No acute distress noted. Patient denied pain.

## 2015-08-17 NOTE — Care Management Note (Signed)
Case Management Note  Patient Details  Name: DAVEION GREENHUT MRN: [id number] Date of Birth: 1961-03-14  Subjective/Objective:    Mr Staver reports that he has an appointment at [facility] on 8/ 08/19/15 at 11am. No other home health needs.                 Action/Plan:   Expected Discharge Date:                  Expected Discharge Plan:     In-House Referral:     Discharge planning Services     Post Acute Care Choice:    Choice offered to:     DME Arranged:    DME Agency:     HH Arranged:    [place] Agency:     Status of Service:     Medicare Important Message Given:    Date Medicare IM Given:    Medicare IM give by:    Date Additional Medicare IM Given:    Additional Medicare Important Message give by:     If discussed at [place] of Stay Meetings, dates discussed:    Additional Comments:  Thao Vanover A, RN 08/17/2015, 1:34 PM

## 2015-08-17 NOTE — Progress Notes (Signed)
Subjective:  Doing well this morning Denies any acute complaint Blood pressure trend overall is improving This morning 168/66 No nausea or vomiting reported Has been tolerating dialysis well     Objective:  Vital signs in last 24 hours:  Temp:  [98.2 F (36.8 C)-98.5 F (36.9 C)] 98.5 F (36.9 C) (08/27 1121) Pulse Rate:  [65-71] 66 (08/27 1121) Resp:  [14-21] 16 (08/27 1121) BP: (156-201)/(58-84) 168/66 mmHg (08/27 1121) SpO2:  [96 %-98 %] 96 % (08/27 1121) Weight:  [80 kg (176 lb 5.9 oz)-80.5 kg (177 lb 7.5 oz)] 80 kg (176 lb 5.9 oz) (08/26 1800)  Weight change: 0.3 kg (10.6 oz) Filed Weights   08/15/15 1246 08/16/15 1400 08/16/15 1800  Weight: 79.788 kg (175 lb 14.4 oz) 80.5 kg (177 lb 7.5 oz) 80 kg (176 lb 5.9 oz)    Intake/Output:    Intake/Output Summary (Last 24 hours) at 08/17/15 1149 Last data filed at 08/17/15 0900  Gross per 24 hour  Intake      0 ml  Output    850 ml  Net   -850 ml     Physical Exam: General: NAD, laying in bed  HEENT  moist mucous membranes   Neck supple  Pulm/lungs  normal effort, clear bilaterally   CVS/Heart  no rub or gallop, soft systolic murmur   Abdomen:   soft, nontender   Extremities:  trace peripheral edema   Neurologic:  alert, followed commands   Skin:  no acute rashes   Access:  AV fistula        Basic Metabolic Panel:   Recent Labs Lab 08/14/15 0347 08/14/15 1158 08/14/15 1515 08/15/15 0455  NA 133*  --   --  138  K 6.1* 6.1* 3.9 4.4  CL 114*  --   --  110  CO2 10*  --   --  18*  GLUCOSE 54*  --   --  102*  BUN 63*  --   --  96*  CREATININE 10.34*  --   --  8.40*  CALCIUM 7.4*  --   --  7.7*     CBC:  Recent Labs Lab 08/14/15 0347 08/14/15 1158 08/14/15 1920 08/15/15 0455 08/17/15 0433  WBC 6.6  --   --  9.0  --   HGB 6.5* 6.3* 7.4* 7.5* 7.3*  HCT 21.3* 20.7* 23.2* 23.2*  --   MCV 82.5  --   --  79.9*  --   PLT 141*  --   --  143*  --       Microbiology:  Recent Results (from  the past 720 hour(s))  MRSA PCR Screening     Status: None   Collection Time: 08/14/15  8:00 PM  Result Value Ref Range Status   MRSA by PCR NEGATIVE NEGATIVE Final    Comment:        The GeneXpert MRSA Assay (FDA approved for NASAL specimens only), is one component of a comprehensive MRSA colonization surveillance program. It is not intended to diagnose MRSA infection nor to guide or monitor treatment for MRSA infections.     Coagulation Studies: No results for input(s): LABPROT, INR in the last 72 hours.  Urinalysis: No results for input(s): COLORURINE, LABSPEC, PHURINE, GLUCOSEU, HGBUR, BILIRUBINUR, KETONESUR, PROTEINUR, UROBILINOGEN, NITRITE, LEUKOCYTESUR in the last 72 hours.  Invalid input(s): APPERANCEUR    Imaging: No results found.   Medications:     . sodium chloride   Intravenous Once  . sodium chloride  Intravenous Once  . amLODipine  10 mg Oral QHS  . aspirin EC  81 mg Oral Daily  . atorvastatin  80 mg Oral Daily  . calcitRIOL  0.25 mcg Oral Daily  . calcium acetate  1,334 mg Oral TID  . carvedilol  37.5 mg Oral BID  . cholecalciferol  2,000 Units Oral Daily  . cloNIDine  0.1 mg Oral TID  . epoetin (EPOGEN/PROCRIT) injection  4,000 Units Intravenous Q T,Th,Sa-HD  . ferrous sulfate  325 mg Oral q morning - 10a  . furosemide  40 mg Oral Daily  . haloperidol lactate  1 mg Intravenous Once  . insulin aspart  0-9 Units Subcutaneous TID WC  . isosorbide mononitrate  30 mg Oral Daily  . losartan  100 mg Oral QHS  . sodium chloride  3 mL Intravenous Q12H  . tamsulosin  0.4 mg Oral Daily   sodium chloride, acetaminophen **OR** acetaminophen, ondansetron **OR** ondansetron (ZOFRAN) IV, oxyCODONE-acetaminophen, senna-docusate, sodium chloride  Assessment/ Plan:  54 y.o. male with medical problems of long-standing diabetes with complications of neuropathy, retinopathy coronary disease with history of heart catheterization in the past, BPH, left nephrectomy  in 1971 due to Wilms tumor, pulmonary hypertension, congestive heart failure, anemia, hypertension , was admitted on 08/14/2015 with fatigue, poor appetite, worsening of renal failure and severe anemia.  1. End-stage renal disease. Likely secondary to diabetic nephropathy - started on dialysis via his left forearm AV fistula - #3 treatment was yesterday -  discharge planning  - Patient has a chair available at [facility] dialysis unit for 11 AM on Monday He needs to be there 10.45 am - Phone [phone]  2. Anemia of chronic kidney disease - Continue Procrit  3. Secondary hyperparathyroidism - Dietary counseling - Continue PhosLo  4. Diabetes type 2 with CKD - Hemoglobin A1c 8.3%  5. Hyperkalemia -  Improved with dialysis  6. HTN - Continue ARB- losartan, amlodipine, does at night. Clonidine when necessary   LOS: 3 Richard Zavala 8/27/201611:49 AM

## 2015-08-17 NOTE — Progress Notes (Signed)
Discharge instructions given to patient along with education about CKD, dialysis, and diet. Also given new medication education. Oxycodone prescription given to patient, the rest have been e-prescribed to [facility] [place]. Patient verbalizes understanding. Will follow up with [facility] on Monday at [place] management has been by and given patient directions to facility. Will follow up outpatient. Instructed to seek a urologist for urinary retention and to ask his kidney doctor about it. Patient's child will come to pick him up. IV and tele discontinued and returned to desk.

## 2015-08-23 NOTE — Discharge Summary (Signed)
Richard Zavala, is a 54 y.o. male  DOB 1961-12-11  MRN [id number].  Admission date:  08/14/2015  Admitting Physician  Epifanio Lesches, MD  Discharge Date:  08/17/2015   Primary MD  Midge Minium, PA  Recommendations for primary care physician for things to follow:  Follow-up with the dialysis unit Monday Wednesday Friday. Patient will go to [facility] dialysis unit.   Admission Diagnosis  Hyperkalemia [E87.5] Renal failure [N19] Weakness [R53.1] Hypoglycemia [E16.2] Anemia due to other cause [D64.89]   Discharge Diagnosis  Hyperkalemia [E87.5] Renal failure [N19] Weakness [R53.1] Hypoglycemia [E16.2] Anemia due to other cause [D64.89]    Active Problems:   Hyperkalemia, diminished renal excretion   Anemia due to other cause   Hyperkalemia   Hypoglycemia   Pain   Renal failure   Weakness   Other chest pain   Elevated troponin      Past Medical History  Diagnosis Date  . Diabetes mellitus without complication   . Hypertension   . Chronic kidney disease (CKD), stage IV (severe)   . CHF (congestive heart failure)   . Hyperlipemia   . Osteoporosis   . Anemia   . TIA (transient ischemic attack)   . Stroke   . Wilms' tumor   . CAD (coronary artery disease)   . Pulmonary HTN   . TRD (traction retinal detachment)   . Blind     Past Surgical History  Procedure Laterality Date  . Nephrectomy radical    . Eye surgery    . Hip surgery    . Av fistula placement         History of present illness and  Hospital Course:     Kindly see H&P for history of present illness and admission details, please review complete Labs, Consult reports and Test reports for all details in brief  HPI  from the history and physical done on the day of admission 54 year old male patient with history of  hypertension, diabetes, chronic kidney disease stage IV came in because of fatigue, nausea poor by mouth intake. SEVERE hyperkalemia and bradycardia. Patient received calcium, insulin, patient gluconate, bicarbonate. Potassium of 6.1 on admission. Creatinine was 10.34.   Hospital Course   #1 severe hyperkalemia secondary to end-stage renal disease: Patient already has left arm AV fistula. Seen by nephrology patient is started on hemodialysis. Patient potassium improved and patient to accept her at [facility] unit for dialysis and discharged home and the patient will follow up with hemodialysis Monday Wednesday Friday. #2 diabetes mellitus type 2. Normal ESRD secondary to diabetic nephropathy.  #3.Malignant  essential hypertension stable with medications. Resumed outpatient medications. For coronary artery disease with the history of chronic diastolic heart failure and echocardiogram showed EF  55%. Seen by cardiology. Patient troponins were slightly up at 0.05. Concerning this cardiology is consulted.  Discharge Condition: Stable   Follow UP  Follow-up Information    Follow up with Midge Minium, PA In 1 week.   Specialty:  Family Medicine   Contact information:   [place] [address] [phone]       Follow up with [facility] on Mon,Wed,Friday Second shift.        Discharge Instructions  and  Discharge Medications        Medication List    TAKE these medications        amLODipine 10 MG tablet  Commonly known as:  NORVASC  Take  1 tablet (10 mg total) by mouth at bedtime.     aspirin EC 81 MG tablet  Take 81 mg by mouth daily.     atorvastatin 80 MG tablet  Commonly known as:  LIPITOR  Take 80 mg by mouth daily.     calcitRIOL 0.25 MCG capsule  Commonly known as:  ROCALTROL  Take 1 capsule by mouth daily.     calcium acetate 667 MG capsule  Commonly known as:  PHOSLO  Take 2 capsules by mouth 3 (three)  times daily.     cloNIDine 0.1 MG tablet  Commonly known as:  CATAPRES  Take 1 tablet (0.1 mg total) by mouth 3 (three) times daily.     COREG 12.5 MG tablet  Generic drug:  carvedilol  Take 37.5 mg by mouth 2 (two) times daily.     D 2000 2000 UNITS Tabs  Generic drug:  Cholecalciferol  Take 1,000 mg by mouth daily.     ferrous sulfate 325 (65 FE) MG tablet  Take 325 mg by mouth every morning.     furosemide 40 MG tablet  Commonly known as:  LASIX  Take 40 mg by mouth daily.     hydrochlorothiazide 25 MG tablet  Commonly known as:  HYDRODIURIL  Take 25 mg by mouth 2 (two) times daily.     isosorbide mononitrate 30 MG 24 hr tablet  Commonly known as:  IMDUR  Take 1 tablet (30 mg total) by mouth daily.     LANTUS 100 UNIT/ML injection  Generic drug:  insulin glargine  Inject 17 Units into the skin at bedtime.     losartan 100 MG tablet  Commonly known as:  COZAAR  Take 1 tablet (100 mg total) by mouth at bedtime.     oxyCODONE-acetaminophen 5-325 MG per tablet  Commonly known as:  PERCOCET/ROXICET  Take 1 tablet by mouth every 6 (six) hours as needed for moderate pain or severe pain.     tamsulosin 0.4 MG Caps capsule  Commonly known as:  FLOMAX  Take 1 capsule by mouth daily.          Diet and Activity recommendation: See Discharge Instructions above   Consults obtained -cardiology, nephrology.   Major procedures and Radiology Reports - PLEASE review detailed and final reports for all details, in brief -      Dg Neck Soft Tissue  08/14/2015   CLINICAL DATA:  Increasing pain.  History of stroke.  EXAM: NECK SOFT TISSUES - 1+ VIEW  COMPARISON:  CT 11 06/2012.  FINDINGS: Epiglottis normal. Retropharyngeal space normal. Cervical airway widely patent. Degenerative changes cervical spine loss of normal cervical lordosis. No acute bony abnormality identified. Carotid atherosclerotic vascular calcification.  IMPRESSION: 1. Carotid atherosclerotic vascular  disease. 2. Soft tissues of the neck are otherwise unremarkable. Degenerative changes cervical spine.   Electronically Signed   By: Vernon   On: 08/14/2015 14:45   Dg Chest 2 View  08/14/2015   CLINICAL DATA:  Rib pain and weakness. Diarrhea for a month. History of diabetes, congestive heart failure, and renal failure.  EXAM: CHEST  2 VIEW  COMPARISON:  None.  FINDINGS: Borderline heart size and pulmonary vascularity. No edema or consolidation in the lungs. No blunting of costophrenic angles. No pneumothorax. Mediastinal contours appear intact. Degenerative changes in the spine and shoulders.  IMPRESSION: Borderline heart size and pulmonary vascularity. No edema or consolidation.   Electronically Signed   By: Oren Beckmann.D.  On: 08/14/2015 03:59    Micro Results     Recent Results (from the past 240 hour(s))  MRSA PCR Screening     Status: None   Collection Time: 08/14/15  8:00 PM  Result Value Ref Range Status   MRSA by PCR NEGATIVE NEGATIVE Final    Comment:        The GeneXpert MRSA Assay (FDA approved for NASAL specimens only), is one component of a comprehensive MRSA colonization surveillance program. It is not intended to diagnose MRSA infection nor to guide or monitor treatment for MRSA infections.        Today   Subjective:   Richard Zavala today has no headache,no chest abdominal pain,no new weakness tingling or numbness, feels much better wants to go home today.   Objective:   Blood pressure 168/66, pulse 66, temperature 98.5 F (36.9 C), temperature source Oral, resp. rate 16, height 5\' 8"  (1.727 m), weight 80 kg (176 lb 5.9 oz), SpO2 96 %.  No intake or output data in the 24 hours ending 08/23/15 0655  Exam Awake Alert, Oriented x 3, No new F.N deficits, Normal affect Strang.AT,PERRAL Supple Neck,No JVD, No cervical lymphadenopathy appriciated.  Symmetrical Chest wall movement, Good air movement bilaterally, CTAB RRR,No Gallops,Rubs or new  Murmurs, No Parasternal Heave +ve B.Sounds, Abd Soft, Non tender, No organomegaly appriciated, No rebound -guarding or rigidity. No Cyanosis, Clubbing or edema, No new Rash or bruise  Data Review   CBC w Diff:  Lab Results  Component Value Date   WBC 9.0 08/15/2015   WBC 7.5 10/28/2012   HGB 7.3* 08/17/2015   HGB 7.9* 10/28/2012   HCT 23.2* 08/15/2015   HCT 23.2* 10/28/2012   PLT 143* 08/15/2015   PLT 208 10/28/2012   LYMPHOPCT 36.5 10/28/2012   MONOPCT 7.7 10/28/2012   EOSPCT 4.0 10/28/2012   BASOPCT 0.8 10/28/2012    CMP:  Lab Results  Component Value Date   NA 138 08/15/2015   NA 141 10/28/2012   K 4.4 08/15/2015   K 5.2* 10/28/2012   CL 110 08/15/2015   CL 111* 10/28/2012   CO2 18* 08/15/2015   CO2 22 10/28/2012   BUN 96* 08/15/2015   BUN 27* 10/28/2012   CREATININE 8.40* 08/15/2015   CREATININE 1.52* 10/28/2012   PROT 6.1* 08/14/2015   PROT 4.7* 10/28/2012   ALBUMIN 3.1* 08/14/2015   ALBUMIN 2.1* 10/28/2012   BILITOT 0.4 08/14/2015   BILITOT 0.2 10/28/2012   ALKPHOS 74 08/14/2015   ALKPHOS 75 10/28/2012   AST 19 08/14/2015   AST 20 10/28/2012   ALT 16* 08/14/2015   ALT 19 10/28/2012  .   Total Time in preparing paper work, data evaluation and todays exam - 69 minutes  Griselda Tosh M.D on 08/17/2015 at 6:55 AM

## 2015-08-26 ENCOUNTER — Other Ambulatory Visit: Payer: Self-pay | Admitting: Nephrology

## 2015-08-26 DIAGNOSIS — N186 End stage renal disease: Secondary | ICD-10-CM

## 2015-08-26 DIAGNOSIS — Z992 Dependence on renal dialysis: Principal | ICD-10-CM

## 2015-08-27 ENCOUNTER — Other Ambulatory Visit: Payer: Self-pay | Admitting: Vascular Surgery

## 2015-08-27 ENCOUNTER — Other Ambulatory Visit
Admission: RE | Admit: 2015-08-27 | Discharge: 2015-08-27 | Disposition: A | Discharge status: Home / Self Care | Payer: Medicaid Other | Source: Ambulatory Visit | Attending: Vascular Surgery | Admitting: Vascular Surgery

## 2015-08-27 DIAGNOSIS — N186 End stage renal disease: Secondary | ICD-10-CM | POA: Diagnosis not present

## 2015-08-27 LAB — BASIC METABOLIC PANEL
ANION GAP: 12 (ref 5–15)
BUN: 73 mg/dL — ABNORMAL HIGH (ref 6–20)
CALCIUM: 7.5 mg/dL — AB (ref 8.9–10.3)
CO2: 20 mmol/L — ABNORMAL LOW (ref 22–32)
CREATININE: 7.92 mg/dL — AB (ref 0.61–1.24)
Chloride: 102 mmol/L (ref 101–111)
GFR, EST AFRICAN AMERICAN: 8 mL/min — AB (ref >60)
GFR, EST NON AFRICAN AMERICAN: 7 mL/min — AB (ref >60)
GLUCOSE: 138 mg/dL — AB (ref 65–99)
Potassium: 4 mmol/L (ref 3.5–5.1)
Sodium: 134 mmol/L — ABNORMAL LOW (ref 135–145)

## 2015-08-28 ENCOUNTER — Encounter: Payer: Self-pay | Admitting: *Deleted

## 2015-08-28 ENCOUNTER — Encounter
Admission: RE | Disposition: A | Discharge status: Home / Self Care | Payer: Self-pay | Source: Ambulatory Visit | Attending: Vascular Surgery

## 2015-08-28 ENCOUNTER — Ambulatory Visit
Admission: RE | Admit: 2015-08-28 | Discharge: 2015-08-28 | Disposition: A | Discharge status: Home / Self Care | Payer: Medicaid Other | Source: Ambulatory Visit | Attending: Vascular Surgery | Admitting: Vascular Surgery

## 2015-08-28 DIAGNOSIS — I120 Hypertensive chronic kidney disease with stage 5 chronic kidney disease or end stage renal disease: Secondary | ICD-10-CM | POA: Diagnosis not present

## 2015-08-28 DIAGNOSIS — M810 Age-related osteoporosis without current pathological fracture: Secondary | ICD-10-CM | POA: Diagnosis not present

## 2015-08-28 DIAGNOSIS — I509 Heart failure, unspecified: Secondary | ICD-10-CM | POA: Insufficient documentation

## 2015-08-28 DIAGNOSIS — E785 Hyperlipidemia, unspecified: Secondary | ICD-10-CM | POA: Diagnosis not present

## 2015-08-28 DIAGNOSIS — E1122 Type 2 diabetes mellitus with diabetic chronic kidney disease: Secondary | ICD-10-CM | POA: Diagnosis not present

## 2015-08-28 DIAGNOSIS — Z79899 Other long term (current) drug therapy: Secondary | ICD-10-CM | POA: Insufficient documentation

## 2015-08-28 DIAGNOSIS — Z8673 Personal history of transient ischemic attack (TIA), and cerebral infarction without residual deficits: Secondary | ICD-10-CM | POA: Insufficient documentation

## 2015-08-28 DIAGNOSIS — T82858A Stenosis of vascular prosthetic devices, implants and grafts, initial encounter: Secondary | ICD-10-CM | POA: Insufficient documentation

## 2015-08-28 DIAGNOSIS — N186 End stage renal disease: Secondary | ICD-10-CM | POA: Insufficient documentation

## 2015-08-28 DIAGNOSIS — Y832 Surgical operation with anastomosis, bypass or graft as the cause of abnormal reaction of the patient, or of later complication, without mention of misadventure at the time of the procedure: Secondary | ICD-10-CM | POA: Insufficient documentation

## 2015-08-28 DIAGNOSIS — R197 Diarrhea, unspecified: Secondary | ICD-10-CM | POA: Diagnosis not present

## 2015-08-28 DIAGNOSIS — Z794 Long term (current) use of insulin: Secondary | ICD-10-CM | POA: Insufficient documentation

## 2015-08-28 DIAGNOSIS — Z7982 Long term (current) use of aspirin: Secondary | ICD-10-CM | POA: Insufficient documentation

## 2015-08-28 DIAGNOSIS — Z992 Dependence on renal dialysis: Secondary | ICD-10-CM | POA: Diagnosis not present

## 2015-08-28 DIAGNOSIS — H540 Blindness, both eyes: Secondary | ICD-10-CM | POA: Diagnosis not present

## 2015-08-28 DIAGNOSIS — D649 Anemia, unspecified: Secondary | ICD-10-CM | POA: Diagnosis not present

## 2015-08-28 HISTORY — PX: PERIPHERAL VASCULAR CATHETERIZATION: SHX172C

## 2015-08-28 LAB — POTASSIUM (ARMC VASCULAR LAB ONLY): POTASSIUM (ARMC VASCULAR LAB): 4.3

## 2015-08-28 LAB — GLUCOSE, CAPILLARY: GLUCOSE-CAPILLARY: 127 mg/dL — AB (ref 65–99)

## 2015-08-28 SURGERY — A/V SHUNTOGRAM/FISTULAGRAM
Anesthesia: Moderate Sedation

## 2015-08-28 MED ORDER — IOHEXOL 300 MG/ML  SOLN
INTRAMUSCULAR | Status: DC | PRN
  Administered 2015-08-28: 30 mL via INTRA_ARTERIAL

## 2015-08-28 MED ORDER — FENTANYL CITRATE (PF) 100 MCG/2ML IJ SOLN
INTRAMUSCULAR | Status: DC | PRN
  Administered 2015-08-28 (×2): 50 ug via INTRAVENOUS

## 2015-08-28 MED ORDER — ALUM & MAG HYDROXIDE-SIMETH 200-200-20 MG/5ML PO SUSP: 15.0000 mL | ORAL | Status: DC | PRN

## 2015-08-28 MED ORDER — FENTANYL CITRATE (PF) 100 MCG/2ML IJ SOLN
INTRAMUSCULAR | Status: AC
  Filled 2015-08-28: qty 2

## 2015-08-28 MED ORDER — HEPARIN (PORCINE) IN NACL 2-0.9 UNIT/ML-% IJ SOLN
INTRAMUSCULAR | Status: AC
  Filled 2015-08-28: qty 1000

## 2015-08-28 MED ORDER — MIDAZOLAM HCL 5 MG/5ML IJ SOLN
INTRAMUSCULAR | Status: AC
Start: 2015-08-28 — End: 2015-08-28
  Filled 2015-08-28: qty 5

## 2015-08-28 MED ORDER — ACETAMINOPHEN 325 MG RE SUPP: 325.0000 mg | RECTAL | Status: DC | PRN

## 2015-08-28 MED ORDER — METOPROLOL TARTRATE 1 MG/ML IV SOLN: 2.0000 mg | INTRAVENOUS | Status: DC | PRN

## 2015-08-28 MED ORDER — ACETAMINOPHEN 325 MG PO TABS: 325.0000 mg | ORAL_TABLET | ORAL | Status: DC | PRN

## 2015-08-28 MED ORDER — HEPARIN SODIUM (PORCINE) 1000 UNIT/ML IJ SOLN
INTRAMUSCULAR | Status: AC
Start: 2015-08-28 — End: 2015-08-28
  Filled 2015-08-28: qty 1

## 2015-08-28 MED ORDER — MORPHINE SULFATE (PF) 4 MG/ML IV SOLN: 2.0000 mg | INTRAVENOUS | Status: DC | PRN

## 2015-08-28 MED ORDER — CLINDAMYCIN PHOSPHATE 300 MG/50ML IV SOLN: 300.0000 mg | Freq: Once | INTRAVENOUS | Status: DC

## 2015-08-28 MED ORDER — HYDRALAZINE HCL 20 MG/ML IJ SOLN
5.0000 mg | INTRAMUSCULAR | Status: DC | PRN
Start: 2015-08-28 — End: 2015-08-28

## 2015-08-28 MED ORDER — HEPARIN SODIUM (PORCINE) 1000 UNIT/ML IJ SOLN
INTRAMUSCULAR | Status: DC | PRN
  Administered 2015-08-28: 3000 [IU] via INTRAVENOUS

## 2015-08-28 MED ORDER — LIDOCAINE-EPINEPHRINE (PF) 1 %-1:200000 IJ SOLN
INTRAMUSCULAR | Status: DC | PRN
  Administered 2015-08-28: 10 mL via INTRADERMAL

## 2015-08-28 MED ORDER — PHENOL 1.4 % MT LIQD: 1.0000 | OROMUCOSAL | Status: DC | PRN

## 2015-08-28 MED ORDER — ONDANSETRON HCL 4 MG/2ML IJ SOLN: 4.0000 mg | Freq: Four times a day (QID) | INTRAMUSCULAR | Status: DC | PRN

## 2015-08-28 MED ORDER — CLINDAMYCIN PHOSPHATE 300 MG/50ML IV SOLN
INTRAVENOUS | Status: AC
  Administered 2015-08-28: 10:00:00
  Filled 2015-08-28: qty 50

## 2015-08-28 MED ORDER — LABETALOL HCL 5 MG/ML IV SOLN: 10.0000 mg | INTRAVENOUS | Status: DC | PRN

## 2015-08-28 MED ORDER — INSULIN ASPART 100 UNIT/ML ~~LOC~~ SOLN: 0.0000 [IU] | SUBCUTANEOUS | Status: DC

## 2015-08-28 MED ORDER — SODIUM CHLORIDE 0.9 % IV SOLN
500.0000 mL | Freq: Once | INTRAVENOUS | Status: DC | PRN
Start: 2015-08-28 — End: 2015-08-28

## 2015-08-28 MED ORDER — CEFAZOLIN SODIUM 1-5 GM-% IV SOLN
INTRAVENOUS | Status: AC
  Filled 2015-08-28: qty 50

## 2015-08-28 MED ORDER — OXYCODONE-ACETAMINOPHEN 5-325 MG PO TABS
1.0000 | ORAL_TABLET | ORAL | Status: DC | PRN
Start: 2015-08-28 — End: 2015-08-28

## 2015-08-28 MED ORDER — CHLORHEXIDINE GLUCONATE 4 % EX LIQD: 1.0000 "application " | Freq: Once | CUTANEOUS | Status: DC

## 2015-08-28 MED ORDER — GUAIFENESIN-DM 100-10 MG/5ML PO SYRP: 15.0000 mL | ORAL_SOLUTION | ORAL | Status: DC | PRN

## 2015-08-28 MED ORDER — MIDAZOLAM HCL 2 MG/2ML IJ SOLN
INTRAMUSCULAR | Status: DC | PRN
  Administered 2015-08-28: 1 mg via INTRAVENOUS
  Administered 2015-08-28: 2 mg via INTRAVENOUS

## 2015-08-28 MED ORDER — SODIUM CHLORIDE 0.9 % IV SOLN
INTRAVENOUS | Status: DC
  Administered 2015-08-28 (×2): via INTRAVENOUS

## 2015-08-28 MED ORDER — LIDOCAINE-EPINEPHRINE (PF) 1 %-1:200000 IJ SOLN
INTRAMUSCULAR | Status: AC
  Filled 2015-08-28: qty 30

## 2015-08-28 SURGICAL SUPPLY — 13 items
BAG DECANTER STRL (MISCELLANEOUS) ×3 IMPLANT
BALLN LUTONIX DCB 5X40X130 (BALLOONS) ×3
BALLN LUTONIX DCB 7X60X130 (BALLOONS) ×3
BALLOON LUTONIX DCB 5X40X130 (BALLOONS) ×2 IMPLANT
BALLOON LUTONIX DCB 7X60X130 (BALLOONS) ×2 IMPLANT
CANNULA 5F STIFF (CANNULA) ×3 IMPLANT
CATH TORCON 5FR 0.38 (CATHETERS) ×3 IMPLANT
DEVICE PRESTO INFLATION (MISCELLANEOUS) ×3 IMPLANT
GLIDEWIRE STIFF .35X180X3 HYDR (WIRE) ×3 IMPLANT
PACK ANGIOGRAPHY (CUSTOM PROCEDURE TRAY) ×3 IMPLANT
SHEATH BRITE TIP 6FRX5.5 (SHEATH) ×3 IMPLANT
TOWEL OR 17X26 4PK STRL BLUE (TOWEL DISPOSABLE) ×3 IMPLANT
WIRE MAGIC TORQUE 260C (WIRE) ×3 IMPLANT

## 2015-08-28 NOTE — Progress Notes (Signed)
Pt doing well post procedure, alert and oriented, offered lunchbox, refuses, will wait til he gets home,Dr Lucky Cowboy in to speak with pt with questions answered, instructions and return to office appt given,

## 2015-08-28 NOTE — H&P (Signed)
  [facility] History & Physical Update  The patient was interviewed and re-examined.  The patient's previous History and Physical has been reviewed and is unchanged.  There is no change in the plan of care. We plan to proceed with the scheduled procedure.  DEW,JASON, MD  08/28/2015, 9:13 AM

## 2015-08-28 NOTE — Discharge Instructions (Signed)
Fistulogram, Care After °Refer to this sheet in the next few weeks. These instructions provide you with information on caring for yourself after your procedure. Your health care provider may also give you more specific instructions. Your treatment has been planned according to current medical practices, but problems sometimes occur. Call your health care provider if you have any problems or questions after your procedure. °WHAT TO EXPECT AFTER THE PROCEDURE °After your procedure, it is typical to have the following: °· A small amount of discomfort in the area where the catheters were placed. °· A small amount of bruising around the fistula. °· Sleepiness and fatigue. °HOME CARE INSTRUCTIONS °· Rest at home for the day following your procedure. °· Do not drive or operate heavy machinery while taking pain medicine. °· Take medicines only as directed by your health care provider. °· Do not take baths, swim, or use a hot tub until your health care provider approves. You may shower 24 hours after the procedure or as directed by your health care provider. °· There are many different ways to close and cover an incision, including stitches, skin glue, and adhesive strips. Follow your health care provider's instructions on: °¨ Incision care. °¨ Bandage (dressing) changes and removal. °¨ Incision closure removal. °· Monitor your dialysis fistula carefully. °SEEK MEDICAL CARE IF: °· You have drainage, redness, swelling, or pain at your catheter site. °· You have a fever. °· You have chills. °SEEK IMMEDIATE MEDICAL CARE IF: °· You feel weak. °· You have trouble balancing. °· You have trouble moving your arms or legs. °· You have problems with your speech or vision. °· You can no longer feel a vibration or buzz when you put your fingers over your dialysis fistula. °· The limb that was used for the procedure: °¨ Swells. °¨ Is painful. °¨ Is cold. °¨ Is discolored, such as blue or pale white. °Document Released: 04/23/2014  Document Reviewed: 01/26/2014 °ExitCare® Patient Information ©2015 [employer]. This information is not intended to replace advice given to you by your health care provider. Make sure you discuss any questions you have with your health care provider. ° °

## 2015-08-28 NOTE — Op Note (Signed)
[facility]    OPERATIVE NOTE   PROCEDURE: 1.   Left radiocephalic arteriovenous fistula cannulation under ultrasound guidance 2.   left arm fistulagram including central venogram 3.   Percutaneous transluminal angioplasty of anastomotic stenosis with 5 mm diameter by 4 cm length Lutonix drug-coated angioplasty balloon 4.   Percutaneous transluminal angioplasty of cephalic vein stenosis with 7 mm diameter by 6 cm length Lutonix drug-coated angioplasty balloon  PRE-OPERATIVE DIAGNOSIS: 1. ESRD 2. Poorly functional left radiocephalic AVF  POST-OPERATIVE DIAGNOSIS: same as above   SURGEON: [name], MD  ANESTHESIA: local with MCS  ESTIMATED BLOOD LOSS: Minimal  FINDING(S): 1. Hyperplastic stenosis at the anastomosis of greater than 80%. Separate and distinct stenosis in the distal forearm cephalic vein near several branches in the 60-65% range  SPECIMEN(S):  None  CONTRAST: 30 cc  INDICATIONS: Richard Zavala is a 54 y.o. male who presents with malfunctioning  left radiocephalic arteriovenous fistula.  The patient is scheduled for  left arm fistulagram.  The patient is aware the risks include but are not limited to: bleeding, infection, thrombosis of the cannulated access, and possible anaphylactic reaction to the contrast.  The patient is aware of the risks of the procedure and elects to proceed forward.  DESCRIPTION: After full informed written consent was obtained, the patient was brought back to the angiography suite and placed supine upon the angiography table.  The patient was connected to monitoring equipment.  The  left arm was prepped and draped in the standard fashion for a percutaneous access intervention.  Under ultrasound guidance, the  left radiocephalic arteriovenous fistula was cannulated with a micropuncture needle under direct ultrasound guidance near the antecubital fossa and a retrograde fashion and a permanent image was performed.  The  microwire was advanced into the fistula and the needle was exchanged for the a microsheath.  I then upsized to a 6 Fr Sheath and placed a Kumpe catheter at the anastomosis and into the radial artery and imaging was performed.  Hand injections were completed to image the access including the central venous system. This demonstrated anastomotic intimal hyperplasia in the 80% range and then several centimeters further down the cephalic vein in a separate and distinct location at the area of several cephalic vein branches was a moderate stenosis in the 60-65% range.  Based on the images, this patient will need intervention to these areas. I then gave the patient 3000 units of intravenous heparin.  I then crossed the stenoses with a Magic Tourqe wire.  Based on the imaging, a 5 mm x 4 cm  Lutonix drug-coated angioplasty balloon was selected for the anastomotic stenosis with the distal end of the balloon just into the radial artery.  The balloon was centered around the anastomotic stenosis and inflated to 12 ATM for 1 minute(s).  On completion imaging, a 20-25 % residual stenosis was present.   I then turned my attention to the venous stenosis in the cephalic vein in the distal forearm. A 7 mm diameter by 6 cm length Lutonix drug-coated angioplasty balloon was selected for this lesion. It was inflated to 10 atm for 1 minute and on completion angiogram about a 15-20% residual stenosis was identified. There was spasm in the cephalic vein at the location of the access but otherwise no hemodynamically significant stenoses were identified.   Based on the completion imaging, no further intervention is necessary.  The wire and balloon were removed from the sheath.  A 4-0 Monocryl  purse-string suture was sewn around the sheath.  The sheath was removed while tying down the suture.  A sterile bandage was applied to the puncture site.  COMPLICATIONS: None  CONDITION: Stable   [name]  08/28/2015 10:18 AM

## 2015-09-10 ENCOUNTER — Ambulatory Visit
Admission: RE | Admit: 2015-09-10 | Discharge: 2015-09-10 | Disposition: A | Discharge status: Home / Self Care | Payer: Medicaid Other | Source: Ambulatory Visit | Attending: Nephrology | Admitting: Nephrology

## 2015-09-10 DIAGNOSIS — N186 End stage renal disease: Secondary | ICD-10-CM | POA: Insufficient documentation

## 2015-09-10 DIAGNOSIS — D649 Anemia, unspecified: Secondary | ICD-10-CM | POA: Diagnosis present

## 2015-09-10 LAB — HEMOGLOBIN: Hemoglobin: 7.6 g/dL — ABNORMAL LOW (ref 13.0–18.0)

## 2015-09-10 LAB — PREPARE RBC (CROSSMATCH)

## 2015-09-10 MED ORDER — SODIUM CHLORIDE 0.9 % IV SOLN
Freq: Once | INTRAVENOUS | Status: AC
  Administered 2015-09-10: 10:00:00 via INTRAVENOUS

## 2015-09-10 NOTE — OR Nursing (Signed)
Pt present to SDS for blood transfusion- condition stable.

## 2015-09-10 NOTE — Progress Notes (Signed)
Pt discharge via wheelchair accompanied by daughter.condition stable

## 2015-09-11 ENCOUNTER — Inpatient Hospital Stay: Admission: RE | Admit: 2015-09-11 | Payer: Medicaid Other | Source: Ambulatory Visit

## 2015-09-11 LAB — TYPE AND SCREEN
ABO/RH(D): O POS
Antibody Screen: NEGATIVE
UNIT DIVISION: 0

## 2016-05-11 ENCOUNTER — Encounter: Payer: Self-pay | Admitting: *Deleted

## 2016-05-14 ENCOUNTER — Ambulatory Visit: Payer: Medicaid Other | Admitting: Anesthesiology

## 2016-05-14 ENCOUNTER — Encounter: Payer: Self-pay | Admitting: *Deleted

## 2016-05-14 ENCOUNTER — Ambulatory Visit
Admission: RE | Admit: 2016-05-14 | Discharge: 2016-05-14 | Disposition: A | Discharge status: Home / Self Care | Payer: Medicaid Other | Source: Ambulatory Visit | Attending: Ophthalmology | Admitting: Ophthalmology

## 2016-05-14 ENCOUNTER — Encounter
Admission: RE | Disposition: A | Discharge status: Home / Self Care | Payer: Self-pay | Source: Ambulatory Visit | Attending: Ophthalmology

## 2016-05-14 DIAGNOSIS — E119 Type 2 diabetes mellitus without complications: Secondary | ICD-10-CM | POA: Insufficient documentation

## 2016-05-14 DIAGNOSIS — Z862 Personal history of diseases of the blood and blood-forming organs and certain disorders involving the immune mechanism: Secondary | ICD-10-CM | POA: Diagnosis not present

## 2016-05-14 DIAGNOSIS — I2510 Atherosclerotic heart disease of native coronary artery without angina pectoris: Secondary | ICD-10-CM | POA: Diagnosis not present

## 2016-05-14 DIAGNOSIS — H2511 Age-related nuclear cataract, right eye: Secondary | ICD-10-CM | POA: Diagnosis not present

## 2016-05-14 DIAGNOSIS — Z87891 Personal history of nicotine dependence: Secondary | ICD-10-CM | POA: Insufficient documentation

## 2016-05-14 DIAGNOSIS — I10 Essential (primary) hypertension: Secondary | ICD-10-CM | POA: Diagnosis not present

## 2016-05-14 DIAGNOSIS — I509 Heart failure, unspecified: Secondary | ICD-10-CM | POA: Diagnosis not present

## 2016-05-14 DIAGNOSIS — Z8673 Personal history of transient ischemic attack (TIA), and cerebral infarction without residual deficits: Secondary | ICD-10-CM | POA: Insufficient documentation

## 2016-05-14 HISTORY — PX: CATARACT EXTRACTION W/PHACO: SHX586

## 2016-05-14 HISTORY — DX: Polyneuropathy, unspecified: G62.9

## 2016-05-14 HISTORY — DX: Benign prostatic hyperplasia without lower urinary tract symptoms: N40.0

## 2016-05-14 LAB — POCT I-STAT 4, (NA,K, GLUC, HGB,HCT)
Glucose, Bld: 78 mg/dL (ref 65–99)
HCT: 37 % — ABNORMAL LOW (ref 39.0–52.0)
Hemoglobin: 12.6 g/dL — ABNORMAL LOW (ref 13.0–17.0)
POTASSIUM: 3.4 mmol/L — AB (ref 3.5–5.1)
SODIUM: 139 mmol/L (ref 135–145)

## 2016-05-14 LAB — GLUCOSE, CAPILLARY: Glucose-Capillary: 71 mg/dL (ref 65–99)

## 2016-05-14 SURGERY — PHACOEMULSIFICATION, CATARACT, WITH IOL INSERTION
Anesthesia: Monitor Anesthesia Care | Site: Eye | Laterality: Right | Wound class: Clean

## 2016-05-14 MED ORDER — TETRACAINE HCL 0.5 % OP SOLN
OPHTHALMIC | Status: AC
Start: 2016-05-14 — End: 2016-05-14
  Administered 2016-05-14: 1 [drp] via OPHTHALMIC
  Filled 2016-05-14: qty 2

## 2016-05-14 MED ORDER — NA HYALUR & NA CHOND-NA HYALUR 0.55-0.5 ML IO KIT
PACK | INTRAOCULAR | Status: AC
  Filled 2016-05-14: qty 1.05

## 2016-05-14 MED ORDER — LIDOCAINE HCL (PF) 4 % IJ SOLN
INTRAMUSCULAR | Status: AC
  Filled 2016-05-14: qty 5

## 2016-05-14 MED ORDER — NA HYALUR & NA CHOND-NA HYALUR 0.55-0.5 ML IO KIT
PACK | INTRAOCULAR | Status: DC | PRN
  Administered 2016-05-14: 1 via OPHTHALMIC

## 2016-05-14 MED ORDER — POVIDONE-IODINE 5 % OP SOLN
OPHTHALMIC | Status: AC
Start: 2016-05-14 — End: 2016-05-14
  Administered 2016-05-14: 1 via OPHTHALMIC
  Filled 2016-05-14: qty 30

## 2016-05-14 MED ORDER — POVIDONE-IODINE 5 % OP SOLN
1.0000 "application " | Freq: Once | OPHTHALMIC | Status: AC
  Administered 2016-05-14: 1 via OPHTHALMIC

## 2016-05-14 MED ORDER — EPINEPHRINE HCL 1 MG/ML IJ SOLN
INTRAOCULAR | Status: DC | PRN
  Administered 2016-05-14: 10:00:00 via OPHTHALMIC

## 2016-05-14 MED ORDER — TETRACAINE HCL 0.5 % OP SOLN
1.0000 [drp] | Freq: Once | OPHTHALMIC | Status: AC
  Administered 2016-05-14: 1 [drp] via OPHTHALMIC

## 2016-05-14 MED ORDER — CEFUROXIME OPHTHALMIC INJECTION 1 MG/0.1 ML
INJECTION | OPHTHALMIC | Status: AC
  Filled 2016-05-14: qty 0.1

## 2016-05-14 MED ORDER — ARMC OPHTHALMIC DILATING GEL
1.0000 "application " | OPHTHALMIC | Status: DC | PRN
  Administered 2016-05-14: 1 via OPHTHALMIC

## 2016-05-14 MED ORDER — MOXIFLOXACIN HCL 0.5 % OP SOLN
OPHTHALMIC | Status: AC
  Filled 2016-05-14: qty 3

## 2016-05-14 MED ORDER — MIDAZOLAM HCL 2 MG/2ML IJ SOLN
INTRAMUSCULAR | Status: DC | PRN
  Administered 2016-05-14 (×2): 1 mg via INTRAVENOUS

## 2016-05-14 MED ORDER — MOXIFLOXACIN HCL 0.5 % OP SOLN: 1.0000 [drp] | OPHTHALMIC | Status: DC | PRN

## 2016-05-14 MED ORDER — MOXIFLOXACIN HCL 0.5 % OP SOLN
OPHTHALMIC | Status: DC | PRN
  Administered 2016-05-14: 1 [drp] via OPHTHALMIC

## 2016-05-14 MED ORDER — FENTANYL CITRATE (PF) 100 MCG/2ML IJ SOLN
INTRAMUSCULAR | Status: DC | PRN
  Administered 2016-05-14: 50 ug via INTRAVENOUS

## 2016-05-14 MED ORDER — EPINEPHRINE HCL 1 MG/ML IJ SOLN
INTRAMUSCULAR | Status: AC
  Filled 2016-05-14: qty 2

## 2016-05-14 MED ORDER — ARMC OPHTHALMIC DILATING GEL
OPHTHALMIC | Status: AC
  Administered 2016-05-14: 1 via OPHTHALMIC
  Filled 2016-05-14: qty 0.25

## 2016-05-14 MED ORDER — LIDOCAINE HCL (PF) 4 % IJ SOLN
INTRAMUSCULAR | Status: DC | PRN
  Administered 2016-05-14: 10:00:00 via OPHTHALMIC

## 2016-05-14 MED ORDER — SODIUM CHLORIDE 0.9 % IV SOLN
INTRAVENOUS | Status: DC
  Administered 2016-05-14: 09:00:00 via INTRAVENOUS

## 2016-05-14 SURGICAL SUPPLY — 23 items
CANNULA ANT/CHMB 27GA (MISCELLANEOUS) ×3 IMPLANT
CUP MEDICINE 2OZ PLAST GRAD ST (MISCELLANEOUS) ×3 IMPLANT
DRSG TEGADERM 4X4.75 (GAUZE/BANDAGES/DRESSINGS) ×3 IMPLANT
GLOVE BIO SURGEON STRL SZ8 (GLOVE) ×3 IMPLANT
GLOVE BIOGEL M 6.5 STRL (GLOVE) ×3 IMPLANT
GLOVE SURG LX 7.5 STRW (GLOVE) ×2
GLOVE SURG LX STRL 7.5 STRW (GLOVE) ×1 IMPLANT
GOWN STRL REUS W/ TWL LRG LVL3 (GOWN DISPOSABLE) ×2 IMPLANT
GOWN STRL REUS W/TWL LRG LVL3 (GOWN DISPOSABLE) ×4
LENS IOL ACRSF IQ PC 17.0 (Intraocular Lens) ×1 IMPLANT
LENS IOL ACRYSOF IQ POST 17.0 (Intraocular Lens) ×3 IMPLANT
PACK CATARACT (MISCELLANEOUS) ×3 IMPLANT
PACK CATARACT BRASINGTON LX (MISCELLANEOUS) ×3 IMPLANT
PACK EYE AFTER SURG (MISCELLANEOUS) ×3 IMPLANT
SOL BSS BAG (MISCELLANEOUS) ×3
SOL PREP PVP 2OZ (MISCELLANEOUS) ×3
SOLUTION BSS BAG (MISCELLANEOUS) ×1 IMPLANT
SOLUTION PREP PVP 2OZ (MISCELLANEOUS) ×1 IMPLANT
SYR 3ML LL SCALE MARK (SYRINGE) ×3 IMPLANT
SYR 5ML LL (SYRINGE) ×3 IMPLANT
SYR TB 1ML 27GX1/2 LL (SYRINGE) ×3 IMPLANT
WATER STERILE IRR 1000ML POUR (IV SOLUTION) ×3 IMPLANT
WIPE NON LINTING 3.25X3.25 (MISCELLANEOUS) ×3 IMPLANT

## 2016-05-14 NOTE — H&P (Signed)
  The History and Physical notes are on paper, have been signed, and are to be scanned. The patient remains stable and unchanged from the H&P.   Previous H&P reviewed, patient examined, and there are no changes.  Richard Zavala 05/14/2016 9:47 AM

## 2016-05-14 NOTE — Anesthesia Procedure Notes (Signed)
Procedure Name: MAC Performed by: Floride Hutmacher Pre-anesthesia Checklist: Patient identified, Suction available, Emergency Drugs available, Patient being monitored and Timeout performed Oxygen Delivery Method: Nasal cannula       

## 2016-05-14 NOTE — Transfer of Care (Signed)
Immediate Anesthesia Transfer of Care Note  Patient: RIYAD BRINSER  Procedure(s) Performed: Procedure(s) with comments: CATARACT EXTRACTION PHACO AND INTRAOCULAR LENS PLACEMENT (IOC) (Right) - [place] 1.46 AP% 11.5 CDE 12.33 FLUID PACK LOT # [place]:[id number] H  Patient Location: PACU  Anesthesia Type:MAC  Level of Consciousness: awake, alert  and oriented  Airway & Oxygen Therapy: Patient Spontanous Breathing and Patient connected to nasal cannula oxygen  Post-op Assessment: Report given to RN and Post -op Vital signs reviewed and stable  Post vital signs: Reviewed and stable  Last Vitals:  Filed Vitals:   05/14/16 0840  BP: 160/65  Pulse: 59  Temp: 35.8 C  Resp: 20    Last Pain: There were no vitals filed for this visit.       Complications: No apparent anesthesia complications

## 2016-05-14 NOTE — Anesthesia Preprocedure Evaluation (Addendum)
Anesthesia Evaluation  Patient identified by MRN, date of birth, ID band Patient awake    Reviewed: Allergy & Precautions, NPO status , Patient's Chart, lab work & pertinent test results, reviewed documented beta blocker date and time   Airway Mallampati: II  TM Distance: >3 FB     Dental  (+) Chipped   Pulmonary former smoker,           Cardiovascular hypertension, Pt. on medications and Pt. on home beta blockers + CAD and +CHF       Neuro/Psych TIA   GI/Hepatic   Endo/Other  diabetes, Type 2  Renal/GU ESRFRenal disease     Musculoskeletal   Abdominal   Peds  Hematology  (+) anemia ,   Anesthesia Other Findings Hx of Wilm's. HR 55 this am, will not give B-blocker.  Reproductive/Obstetrics                            Anesthesia Physical Anesthesia Plan  ASA: III  Anesthesia Plan: MAC   Post-op Pain Management:    Induction:   Airway Management Planned:   Additional Equipment:   Intra-op Plan:   Post-operative Plan:   Informed Consent: I have reviewed the patients History and Physical, chart, labs and discussed the procedure including the risks, benefits and alternatives for the proposed anesthesia with the patient or authorized representative who has indicated his/her understanding and acceptance.     Plan Discussed with: CRNA  Anesthesia Plan Comments:         Anesthesia Quick Evaluation

## 2016-05-14 NOTE — Anesthesia Postprocedure Evaluation (Signed)
Anesthesia Post Note  Patient: Richard Zavala  Procedure(s) Performed: Procedure(s) (LRB): CATARACT EXTRACTION PHACO AND INTRAOCULAR LENS PLACEMENT (IOC) (Right)  Patient location during evaluation: PACU Anesthesia Type: MAC Level of consciousness: awake and alert and oriented Pain management: satisfactory to patient Vital Signs Assessment: post-procedure vital signs reviewed and stable Respiratory status: respiratory function stable Cardiovascular status: stable    Last Vitals:  Filed Vitals:   05/14/16 0840  BP: 160/65  Pulse: 59  Temp: 35.8 C  Resp: 20    Last Pain: There were no vitals filed for this visit.               Blima Singer

## 2016-05-14 NOTE — Op Note (Signed)
OPERATIVE NOTE  Richard Zavala [id number] 05/14/2016   PREOPERATIVE DIAGNOSIS:  Nuclear sclerotic cataract right eye.  H25.11   POSTOPERATIVE DIAGNOSIS:    Nuclear sclerotic cataract right eye.     PROCEDURE:  Phacoemusification with posterior chamber intraocular lens placement of the right eye   LENS:   Implant Name Type Inv. Item Serial No. Manufacturer Lot No. LRB No. Used  IMPLANT LENS - [id number] Intraocular Lens IMPLANT LENS [id number] ALCON   Right 1       [id number] 17.0 EXP 07/2019   ULTRASOUND TIME: 1 minutes 46 seconds.  CDE 12.33   SURGEON:  Benay Pillow, MD, MPH  ANESTHESIOLOGIST: Anesthesiologist: Gunnar Bulla, MD CRNA: Demetrius Charity, CRNA   ANESTHESIA:  Topical with tetracaine drops and 2% Xylocaine jelly, augmented with 1% preservative-free intracameral lidocaine.  ESTIMATED BLOOD LOSS: less than 1 mL.   COMPLICATIONS:  None.   DESCRIPTION OF PROCEDURE:  The patient was identified in the holding room and transported to the operating room and placed in the supine position under the operating microscope.  The right eye was identified as the operative eye and it was prepped and draped in the usual sterile ophthalmic fashion.   A 1.0 millimeter clear-corneal paracentesis was made at the 10:30 position. 0.5 ml of preservative-free 1% lidocaine with epinephrine was injected into the anterior chamber.  The anterior chamber was filled with Viscoat viscoelastic.  A 2.4 millimeter keratome was used to make a near-clear corneal incision at the 8:00 position.  A curvilinear capsulorrhexis was made with a cystotome and capsulorrhexis forceps.  The Verion was used for guidance with a 5.5 mm rhexis.  Balanced salt solution was used to hydrodissect and hydrodelineate the nucleus.   Phacoemulsification was then used in stop and chop fashion to remove the lens nucleus and epinucleus.  The remaining cortex was then removed using the irrigation and aspiration handpiece. Provisc  was then placed into the capsular bag to distend it for lens placement.  A lens was then injected into the capsular bag.  The remaining viscoelastic was aspirated.   Wounds were hydrated with balanced salt solution.  The anterior chamber was inflated to a physiologic pressure with balanced salt solution. No wound leaks were noted.  Intracameral vigamox 0.1 cc was injected into the eye.  Topical Vigamox drops were applied to the eye.  The patient was taken to the recovery room in stable condition without complications of anesthesia or surgery  Benay Pillow 05/14/2016, 10:45 AM

## 2016-05-14 NOTE — Discharge Instructions (Signed)
Eye Surgery Discharge Instructions  Expect mild scratchy sensation or mild soreness. DO NOT RUB YOUR EYE!  The day of surgery:  Minimal physical activity, but bed rest is not required  No reading, computer work, or close hand work  No bending, lifting, or straining.  May watch TV  For 24 hours:  No driving, legal decisions, or alcoholic beverages  Safety precautions  Eat anything you prefer: It is better to start with liquids, then soup then solid foods.  _____ Eye patch should be worn until postoperative exam tomorrow.  ____ Solar shield eyeglasses should be worn for comfort in the sunlight/patch while sleeping  Resume all regular medications including aspirin or Coumadin if these were discontinued prior to surgery. You may shower, bathe, shave, or wash your hair. Tylenol may be taken for mild discomfort.  Call your doctor if you experience significant pain, nausea, or vomiting, fever > 101 or other signs of infection. [phone] or [phone] Specific instructions:  Follow-up Information    Follow up with Benay Pillow, MD.   Specialty:  Ophthalmology   Why:  05-15-16 at 1:20   Contact information:   [address] [place] [address] [phone]      Eye Surgery Discharge Instructions  Expect mild scratchy sensation or mild soreness. DO NOT RUB YOUR EYE!  The day of surgery:  Minimal physical activity, but bed rest is not required  No reading, computer work, or close hand work  No bending, lifting, or straining.  May watch TV  For 24 hours:  No driving, legal decisions, or alcoholic beverages  Safety precautions  Eat anything you prefer: It is better to start with liquids, then soup then solid foods.  _____ Eye patch should be worn until postoperative exam tomorrow.  ____ Solar shield eyeglasses should be worn for comfort in the sunlight/patch while sleeping  Resume all regular medications including aspirin or Coumadin if these were  discontinued prior to surgery. You may shower, bathe, shave, or wash your hair. Tylenol may be taken for mild discomfort.  Call your doctor if you experience significant pain, nausea, or vomiting, fever > 101 or other signs of infection. [phone] or [phone] Specific instructions:  Follow-up Information    Follow up with Benay Pillow, MD.   Specialty:  Ophthalmology   Why:  05-15-16 at 1:20   Contact information:   [address] [place] [address] [phone]

## 2016-05-17 DIAGNOSIS — Z01818 Encounter for other preprocedural examination: Secondary | ICD-10-CM | POA: Insufficient documentation

## 2016-08-21 ENCOUNTER — Observation Stay: Payer: Self-pay

## 2016-08-21 ENCOUNTER — Encounter: Payer: Self-pay | Admitting: Emergency Medicine

## 2016-08-21 ENCOUNTER — Emergency Department: Payer: Self-pay

## 2016-08-21 ENCOUNTER — Observation Stay
Admission: EM | Admit: 2016-08-21 | Discharge: 2016-08-22 | Disposition: A | Discharge status: Home / Self Care | Payer: Self-pay | Attending: Internal Medicine | Admitting: Internal Medicine

## 2016-08-21 ENCOUNTER — Observation Stay (HOSPITAL_BASED_OUTPATIENT_CLINIC_OR_DEPARTMENT_OTHER)
Admit: 2016-08-21 | Discharge: 2016-08-21 | Disposition: A | Discharge status: Home / Self Care | Payer: Medicaid Other | Attending: Internal Medicine | Admitting: Internal Medicine

## 2016-08-21 DIAGNOSIS — N400 Enlarged prostate without lower urinary tract symptoms: Secondary | ICD-10-CM | POA: Insufficient documentation

## 2016-08-21 DIAGNOSIS — Z992 Dependence on renal dialysis: Secondary | ICD-10-CM | POA: Insufficient documentation

## 2016-08-21 DIAGNOSIS — D631 Anemia in chronic kidney disease: Secondary | ICD-10-CM | POA: Insufficient documentation

## 2016-08-21 DIAGNOSIS — G459 Transient cerebral ischemic attack, unspecified: Secondary | ICD-10-CM | POA: Diagnosis present

## 2016-08-21 DIAGNOSIS — R29898 Other symptoms and signs involving the musculoskeletal system: Secondary | ICD-10-CM

## 2016-08-21 DIAGNOSIS — F17210 Nicotine dependence, cigarettes, uncomplicated: Secondary | ICD-10-CM | POA: Insufficient documentation

## 2016-08-21 DIAGNOSIS — S52611A Displaced fracture of right ulna styloid process, initial encounter for closed fracture: Secondary | ICD-10-CM | POA: Insufficient documentation

## 2016-08-21 DIAGNOSIS — Z9841 Cataract extraction status, right eye: Secondary | ICD-10-CM | POA: Insufficient documentation

## 2016-08-21 DIAGNOSIS — I639 Cerebral infarction, unspecified: Principal | ICD-10-CM | POA: Insufficient documentation

## 2016-08-21 DIAGNOSIS — I313 Pericardial effusion (noninflammatory): Secondary | ICD-10-CM | POA: Insufficient documentation

## 2016-08-21 DIAGNOSIS — Z79899 Other long term (current) drug therapy: Secondary | ICD-10-CM | POA: Insufficient documentation

## 2016-08-21 DIAGNOSIS — Z85528 Personal history of other malignant neoplasm of kidney: Secondary | ICD-10-CM | POA: Insufficient documentation

## 2016-08-21 DIAGNOSIS — E1151 Type 2 diabetes mellitus with diabetic peripheral angiopathy without gangrene: Secondary | ICD-10-CM | POA: Insufficient documentation

## 2016-08-21 DIAGNOSIS — I5032 Chronic diastolic (congestive) heart failure: Secondary | ICD-10-CM | POA: Insufficient documentation

## 2016-08-21 DIAGNOSIS — H548 Legal blindness, as defined in USA: Secondary | ICD-10-CM | POA: Insufficient documentation

## 2016-08-21 DIAGNOSIS — G458 Other transient cerebral ischemic attacks and related syndromes: Secondary | ICD-10-CM

## 2016-08-21 DIAGNOSIS — Z8249 Family history of ischemic heart disease and other diseases of the circulatory system: Secondary | ICD-10-CM | POA: Insufficient documentation

## 2016-08-21 DIAGNOSIS — E785 Hyperlipidemia, unspecified: Secondary | ICD-10-CM | POA: Insufficient documentation

## 2016-08-21 DIAGNOSIS — Z833 Family history of diabetes mellitus: Secondary | ICD-10-CM | POA: Insufficient documentation

## 2016-08-21 DIAGNOSIS — Z961 Presence of intraocular lens: Secondary | ICD-10-CM | POA: Insufficient documentation

## 2016-08-21 DIAGNOSIS — Z905 Acquired absence of kidney: Secondary | ICD-10-CM | POA: Insufficient documentation

## 2016-08-21 DIAGNOSIS — I2510 Atherosclerotic heart disease of native coronary artery without angina pectoris: Secondary | ICD-10-CM | POA: Insufficient documentation

## 2016-08-21 DIAGNOSIS — W010XXA Fall on same level from slipping, tripping and stumbling without subsequent striking against object, initial encounter: Secondary | ICD-10-CM | POA: Insufficient documentation

## 2016-08-21 DIAGNOSIS — S52571A Other intraarticular fracture of lower end of right radius, initial encounter for closed fracture: Secondary | ICD-10-CM | POA: Insufficient documentation

## 2016-08-21 DIAGNOSIS — E876 Hypokalemia: Secondary | ICD-10-CM | POA: Insufficient documentation

## 2016-08-21 DIAGNOSIS — Z9889 Other specified postprocedural states: Secondary | ICD-10-CM | POA: Insufficient documentation

## 2016-08-21 DIAGNOSIS — I6523 Occlusion and stenosis of bilateral carotid arteries: Secondary | ICD-10-CM | POA: Insufficient documentation

## 2016-08-21 DIAGNOSIS — I672 Cerebral atherosclerosis: Secondary | ICD-10-CM | POA: Insufficient documentation

## 2016-08-21 DIAGNOSIS — Z888 Allergy status to other drugs, medicaments and biological substances status: Secondary | ICD-10-CM | POA: Insufficient documentation

## 2016-08-21 DIAGNOSIS — Z7982 Long term (current) use of aspirin: Secondary | ICD-10-CM | POA: Insufficient documentation

## 2016-08-21 DIAGNOSIS — Z880 Allergy status to penicillin: Secondary | ICD-10-CM | POA: Insufficient documentation

## 2016-08-21 DIAGNOSIS — I132 Hypertensive heart and chronic kidney disease with heart failure and with stage 5 chronic kidney disease, or end stage renal disease: Secondary | ICD-10-CM | POA: Insufficient documentation

## 2016-08-21 DIAGNOSIS — S52501A Unspecified fracture of the lower end of right radius, initial encounter for closed fracture: Secondary | ICD-10-CM | POA: Diagnosis present

## 2016-08-21 DIAGNOSIS — N186 End stage renal disease: Secondary | ICD-10-CM | POA: Insufficient documentation

## 2016-08-21 DIAGNOSIS — Z8673 Personal history of transient ischemic attack (TIA), and cerebral infarction without residual deficits: Secondary | ICD-10-CM | POA: Insufficient documentation

## 2016-08-21 DIAGNOSIS — E1140 Type 2 diabetes mellitus with diabetic neuropathy, unspecified: Secondary | ICD-10-CM | POA: Insufficient documentation

## 2016-08-21 DIAGNOSIS — Z825 Family history of asthma and other chronic lower respiratory diseases: Secondary | ICD-10-CM | POA: Insufficient documentation

## 2016-08-21 DIAGNOSIS — E1122 Type 2 diabetes mellitus with diabetic chronic kidney disease: Secondary | ICD-10-CM | POA: Insufficient documentation

## 2016-08-21 DIAGNOSIS — M810 Age-related osteoporosis without current pathological fracture: Secondary | ICD-10-CM | POA: Insufficient documentation

## 2016-08-21 DIAGNOSIS — Z794 Long term (current) use of insulin: Secondary | ICD-10-CM | POA: Insufficient documentation

## 2016-08-21 HISTORY — DX: End stage renal disease: N18.6

## 2016-08-21 LAB — CBC
HEMATOCRIT: 31.7 % — AB (ref 40.0–52.0)
Hemoglobin: 11.4 g/dL — ABNORMAL LOW (ref 13.0–18.0)
MCH: 32.6 pg (ref 26.0–34.0)
MCHC: 36.1 g/dL — ABNORMAL HIGH (ref 32.0–36.0)
MCV: 90.4 fL (ref 80.0–100.0)
PLATELETS: 182 10*3/uL (ref 150–440)
RBC: 3.5 MIL/uL — AB (ref 4.40–5.90)
RDW: 14.5 % (ref 11.5–14.5)
WBC: 7.9 10*3/uL (ref 3.8–10.6)

## 2016-08-21 LAB — DIFFERENTIAL
BASOS ABS: 0.1 10*3/uL (ref 0–0.1)
BASOS PCT: 1 %
Eosinophils Absolute: 0.4 10*3/uL (ref 0–0.7)
Eosinophils Relative: 5 %
LYMPHS PCT: 11 %
Lymphs Abs: 0.9 10*3/uL — ABNORMAL LOW (ref 1.0–3.6)
Monocytes Absolute: 0.9 10*3/uL (ref 0.2–1.0)
Monocytes Relative: 11 %
NEUTROS ABS: 5.6 10*3/uL (ref 1.4–6.5)
Neutrophils Relative %: 72 %

## 2016-08-21 LAB — COMPREHENSIVE METABOLIC PANEL
ALK PHOS: 125 U/L (ref 38–126)
ALT: 15 U/L — AB (ref 17–63)
ANION GAP: 8 (ref 5–15)
AST: 20 U/L (ref 15–41)
Albumin: 3.1 g/dL — ABNORMAL LOW (ref 3.5–5.0)
BUN: 22 mg/dL — ABNORMAL HIGH (ref 6–20)
CALCIUM: 7.9 mg/dL — AB (ref 8.9–10.3)
CO2: 28 mmol/L (ref 22–32)
Chloride: 99 mmol/L — ABNORMAL LOW (ref 101–111)
Creatinine, Ser: 3.97 mg/dL — ABNORMAL HIGH (ref 0.61–1.24)
GFR calc Af Amer: 18 mL/min — ABNORMAL LOW (ref >60)
GFR calc non Af Amer: 16 mL/min — ABNORMAL LOW (ref >60)
Glucose, Bld: 265 mg/dL — ABNORMAL HIGH (ref 65–99)
POTASSIUM: 3.1 mmol/L — AB (ref 3.5–5.1)
SODIUM: 135 mmol/L (ref 135–145)
TOTAL PROTEIN: 6.5 g/dL (ref 6.5–8.1)
Total Bilirubin: 0.9 mg/dL (ref 0.3–1.2)

## 2016-08-21 LAB — LIPID PANEL
CHOL/HDL RATIO: 2.8 ratio
CHOLESTEROL: 115 mg/dL (ref 0–200)
HDL: 41 mg/dL (ref >40)
LDL CALC: 54 mg/dL (ref 0–99)
TRIGLYCERIDES: 102 mg/dL (ref <150)
VLDL: 20 mg/dL (ref 0–40)

## 2016-08-21 LAB — GLUCOSE, CAPILLARY
Glucose-Capillary: 116 mg/dL — ABNORMAL HIGH (ref 65–99)
Glucose-Capillary: 157 mg/dL — ABNORMAL HIGH (ref 65–99)
Glucose-Capillary: 173 mg/dL — ABNORMAL HIGH (ref 65–99)

## 2016-08-21 LAB — APTT: APTT: 37 s — AB (ref 24–36)

## 2016-08-21 LAB — PROTIME-INR
INR: 1.08
PROTHROMBIN TIME: 14 s (ref 11.4–15.2)

## 2016-08-21 LAB — TROPONIN I: TROPONIN I: 0.03 ng/mL — AB (ref <0.03)

## 2016-08-21 LAB — MRSA PCR SCREENING: MRSA by PCR: NEGATIVE

## 2016-08-21 MED ORDER — ACETAMINOPHEN 650 MG RE SUPP: 650.0000 mg | Freq: Four times a day (QID) | RECTAL | Status: DC | PRN

## 2016-08-21 MED ORDER — HEPARIN SODIUM (PORCINE) 5000 UNIT/ML IJ SOLN
5000.0000 [IU] | Freq: Three times a day (TID) | INTRAMUSCULAR | Status: DC
  Administered 2016-08-21: 5000 [IU] via SUBCUTANEOUS
  Filled 2016-08-21 (×2): qty 1

## 2016-08-21 MED ORDER — ONDANSETRON HCL 4 MG/2ML IJ SOLN: 4.0000 mg | Freq: Four times a day (QID) | INTRAMUSCULAR | Status: DC | PRN

## 2016-08-21 MED ORDER — INSULIN ASPART 100 UNIT/ML ~~LOC~~ SOLN: 0.0000 [IU] | Freq: Every day | SUBCUTANEOUS | Status: DC

## 2016-08-21 MED ORDER — CLOPIDOGREL BISULFATE 75 MG PO TABS
75.0000 mg | ORAL_TABLET | Freq: Every day | ORAL | Status: DC
  Administered 2016-08-21 – 2016-08-22 (×2): 75 mg via ORAL
  Filled 2016-08-21: qty 1

## 2016-08-21 MED ORDER — CARVEDILOL 25 MG PO TABS
37.5000 mg | ORAL_TABLET | Freq: Two times a day (BID) | ORAL | Status: DC
  Administered 2016-08-22: 37.5 mg via ORAL
  Filled 2016-08-21: qty 1

## 2016-08-21 MED ORDER — SODIUM CHLORIDE 0.9% FLUSH
3.0000 mL | Freq: Two times a day (BID) | INTRAVENOUS | Status: DC
  Administered 2016-08-21 – 2016-08-22 (×3): 3 mL via INTRAVENOUS

## 2016-08-21 MED ORDER — ACETAMINOPHEN 325 MG PO TABS
650.0000 mg | ORAL_TABLET | Freq: Four times a day (QID) | ORAL | Status: DC | PRN
  Administered 2016-08-22: 650 mg via ORAL
  Filled 2016-08-21: qty 2

## 2016-08-21 MED ORDER — HYDRALAZINE HCL 50 MG PO TABS
75.0000 mg | ORAL_TABLET | Freq: Three times a day (TID) | ORAL | Status: DC
  Administered 2016-08-21 – 2016-08-22 (×3): 75 mg via ORAL
  Filled 2016-08-21 (×3): qty 1

## 2016-08-21 MED ORDER — ONDANSETRON HCL 4 MG PO TABS: 4.0000 mg | ORAL_TABLET | Freq: Four times a day (QID) | ORAL | Status: DC | PRN

## 2016-08-21 MED ORDER — INSULIN GLARGINE 100 UNIT/ML ~~LOC~~ SOLN
5.0000 [IU] | Freq: Every day | SUBCUTANEOUS | Status: DC
  Administered 2016-08-21 – 2016-08-22 (×2): 5 [IU] via SUBCUTANEOUS
  Filled 2016-08-21 (×2): qty 0.05

## 2016-08-21 MED ORDER — ATORVASTATIN CALCIUM 20 MG PO TABS
40.0000 mg | ORAL_TABLET | Freq: Every day | ORAL | Status: DC
  Administered 2016-08-21: 21:00:00 40 mg via ORAL
  Filled 2016-08-21: qty 2

## 2016-08-21 MED ORDER — HYDRALAZINE HCL 20 MG/ML IJ SOLN: 10.0000 mg | Freq: Four times a day (QID) | INTRAMUSCULAR | Status: DC | PRN

## 2016-08-21 MED ORDER — ISOSORBIDE MONONITRATE ER 30 MG PO TB24
30.0000 mg | ORAL_TABLET | Freq: Every day | ORAL | Status: DC
  Administered 2016-08-21: 30 mg via ORAL
  Filled 2016-08-21: qty 1

## 2016-08-21 MED ORDER — FUROSEMIDE 40 MG PO TABS
40.0000 mg | ORAL_TABLET | Freq: Two times a day (BID) | ORAL | Status: DC
  Administered 2016-08-21 – 2016-08-22 (×2): 40 mg via ORAL
  Filled 2016-08-21 (×2): qty 1

## 2016-08-21 MED ORDER — AMLODIPINE BESYLATE 10 MG PO TABS
10.0000 mg | ORAL_TABLET | Freq: Every day | ORAL | Status: DC
  Administered 2016-08-21: 10 mg via ORAL
  Filled 2016-08-21: qty 1

## 2016-08-21 MED ORDER — INSULIN ASPART 100 UNIT/ML ~~LOC~~ SOLN: 0.0000 [IU] | Freq: Three times a day (TID) | SUBCUTANEOUS | Status: DC

## 2016-08-21 MED ORDER — ASPIRIN EC 81 MG PO TBEC
81.0000 mg | DELAYED_RELEASE_TABLET | Freq: Every day | ORAL | Status: DC
  Administered 2016-08-22: 10:00:00 81 mg via ORAL
  Filled 2016-08-21: qty 1

## 2016-08-21 MED ORDER — CLOPIDOGREL BISULFATE 75 MG PO TABS
ORAL_TABLET | ORAL | Status: AC
  Filled 2016-08-21: qty 1

## 2016-08-21 MED ORDER — CARVEDILOL 25 MG PO TABS
37.5000 mg | ORAL_TABLET | Freq: Two times a day (BID) | ORAL | Status: DC
  Administered 2016-08-21: 18:00:00 37.5 mg via ORAL
  Filled 2016-08-21: qty 1

## 2016-08-21 NOTE — Evaluation (Signed)
Physical Therapy Evaluation Patient Details Name: Richard Zavala MRN: [id number] DOB: 1961/10/05 Today's Date: 08/21/2016   History of Present Illness  54 y/o male here with recent R sided weakness, nausea, vomiting, dizziness and at least 2 recent falls, found to have small cerebellar infarct.  Pt on HD MWF, R hip surgery 2012 with increased difficulty getting around  Clinical Impression  Pt reports that he normally does not get around all that well, but overall he is not at all safe with standing balance and had multiple LOBs needing heavy assist to keep him upright and pt essentially unstable the entire attempt of ~50 ft of ambulation.  Pt with b/l ankle DF weakness that he reports is not acute, R wrist swollen with limited WBing tolerance and AROM making walker usage difficult.  Pt makes is clear that he wants to go home but, frankly, is unsafe to do so at this time.     Follow Up Recommendations SNF (pt does not wish to go to rehab)    Equipment Recommendations       Recommendations for Other Services       Precautions / Restrictions Precautions Precautions: Fall Restrictions Weight Bearing Restrictions: No      Mobility  Bed Mobility Overal bed mobility: Modified Independent             General bed mobility comments: Pt able to get to sitting EOB w/o assist, did have some low grade dizziness in sitting  Transfers Overall transfer level: Modified independent Equipment used: Rolling walker (2 wheeled)             General transfer comment: Pt is able to rise to standing but leans back of LEs on bed and generally is not confident or steady  Ambulation/Gait Ambulation/Gait assistance: Mod assist;Max assist Ambulation Distance (Feet): 50 Feet Assistive device: 1 person hand held assist       General Gait Details: Pt eager to try walking w/o AD.  Clearly needed UE assist and had multiple LOBs and almost constant unsteadiness and generally was unsafe and off  balance.   Stairs            Wheelchair Mobility    Modified Rankin (Stroke Patients Only)       Balance Overall balance assessment: Needs assistance   Sitting balance-Leahy Scale: Good Sitting balance - Comments: Pt with dizziness, but is able to maintain sitting balance w/o heavy UE assist     Standing balance-Leahy Scale: Poor Standing balance comment: Pt very unsafe with standing and though he made good effort he was unsteady and unsafe.                             Pertinent Vitals/Pain Pain Assessment:  (R wrist pain post fall v. functionally limited)    Home Living Family/patient expects to be discharged to:: Private residence Living Arrangements: Children Available Help at Discharge: Family Type of Home: House Home Access:  (single 1/2 step)       Home Equipment: Walker - 2 wheels;Cane - single point      Prior Function Level of Independence: Independent         Comments: prior to 1 weeks ago he could do community ambulation, etc     Hand Dominance        Extremity/Trunk Assessment   Upper Extremity Assessment: RUE deficits/detail;Overall [facility] for tasks assessed (L UE WFL, R WFL except wrist very limited, esp sup/pronation)  Lower Extremity Assessment: Overall WFL for tasks assessed (except b/l ankle DF grossly 3/5)         Communication   Communication: No difficulties  Cognition Arousal/Alertness: Awake/alert Behavior During Therapy: WFL for tasks assessed/performed;Impulsive Overall Cognitive Status: Within Functional Limits for tasks assessed                      General Comments General comments (skin integrity, edema, etc.): negative Dix-Hallpike Maneuver b/l    Exercises        Assessment/Plan    PT Assessment Patient needs continued PT services  PT Diagnosis Generalized weakness;Difficulty walking   PT Problem List Decreased balance;Decreased coordination;Decreased safety  awareness;Decreased strength;Decreased mobility;Decreased activity tolerance;Decreased knowledge of use of DME  PT Treatment Interventions Gait training;Therapeutic activities;Therapeutic exercise;Balance training;Neuromuscular re-education;DME instruction;Functional mobility training   PT Goals (Current goals can be found in the Care Plan section) Acute Rehab PT Goals Patient Stated Goal: go home PT Goal Formulation: With patient Time For Goal Achievement: 09/04/16 Potential to Achieve Goals: Fair    Frequency 7X/week   Barriers to discharge        Co-evaluation               End of Session Equipment Utilized During Treatment: Gait belt Activity Tolerance: Patient tolerated treatment well Patient left: with bed alarm set;with call bell/phone within reach      Functional Assessment Tool Used: clinical judgement Functional Limitation: Mobility: Walking and moving around Mobility: Walking and Moving Around Current Status [id number]): At least 40 percent but less than 60 percent impaired, limited or restricted Mobility: Walking and Moving Around Goal Status [phone]): At least 20 percent but less than 40 percent impaired, limited or restricted    Time: 1654-1716 PT Time Calculation (min) (ACUTE ONLY): 22 min   Charges:   PT Evaluation $PT Eval Low Complexity: 1 Procedure     PT G Codes:   PT G-Codes **NOT FOR INPATIENT CLASS** Functional Assessment Tool Used: clinical judgement Functional Limitation: Mobility: Walking and moving around Mobility: Walking and Moving Around Current Status [id number]): At least 40 percent but less than 60 percent impaired, limited or restricted Mobility: Walking and Moving Around Goal Status [phone]): At least 20 percent but less than 40 percent impaired, limited or restricted    Kreg Shropshire, DPT 08/21/2016, 5:41 PM

## 2016-08-21 NOTE — Progress Notes (Signed)
*  PRELIMINARY RESULTS* Echocardiogram 2D Echocardiogram has been performed.  Richard Zavala 08/21/2016, 5:32 PM

## 2016-08-21 NOTE — ED Notes (Signed)
Returned from [place]. Dr Doy Mince at bedside

## 2016-08-21 NOTE — H&P (Signed)
[facility] NAME: Richard Zavala    MR#:  [id number]  DATE OF BIRTH:  06-24-1961  DATE OF ADMISSION:  08/21/2016  PRIMARY CARE PHYSICIAN: Midge Minium, PA   REQUESTING/REFERRING PHYSICIAN: Dr. Nance Pear  CHIEF COMPLAINT:   Chief Complaint  Patient presents with  . Extremity Weakness    HISTORY OF PRESENT ILLNESS:  Richard Zavala  is a 55 y.o. male with a known history of Insulin-dependent diabetes mellitus, end-stage renal disease on Monday Wednesday Friday hemodialysis, hypertension, diastolic CHF, history of Wilms tumor status post left nephrectomy presents to the hospital secondary to right leg weakness that started this morning. 6 days ago, patient had an episode of vertigo in the morning. Since then he has been having occasional nausea and weakness and dizziness. The symptoms were improving. This morning he was at dialysis and was coming back from the bathroom and suddenly felt that his right leg was weak and he had to drag his right leg. There was some heaviness of the right arm, however he didn't know if that was from a recent fall and injury to the right wrist or new weakness. He was placed in a wheelchair and sent to the emergency room. His right leg strength has completely recovered at this time. Denies any other speech changes, tingling numbness or focal weakness. He is being admitted for possible TIA. CT of the head showing prior infarcts and chronic small vessel ischemic disease. No acute strokes identified.   PAST MEDICAL HISTORY:   Past Medical History:  Diagnosis Date  . Anemia   . Blind   . BPH (benign prostatic hyperplasia)   . CAD (coronary artery disease)   . CHF (congestive heart failure) ([place])   . Chronic kidney disease (CKD), stage IV (severe) (HCC)    DIALYSIS  . Diabetes mellitus without complication ([place])   . ESRD (end stage renal disease) ([place])    Monday-Wednesday-Friday dialysis  .  Hyperlipemia   . Hypertension   . Neuropathy ([place])   . Osteoporosis   . Pulmonary HTN ([place])   . Stroke [facility])    2013  . TIA (transient ischemic attack)   . TRD (traction retinal detachment)   . Wilms' tumor [facility])     PAST SURGICAL HISTORY:   Past Surgical History:  Procedure Laterality Date  . AV FISTULA PLACEMENT    . CARDIAC CATHETERIZATION    . CATARACT EXTRACTION W/PHACO Right 05/14/2016   Procedure: CATARACT EXTRACTION PHACO AND INTRAOCULAR LENS PLACEMENT (IOC);  Surgeon: Eulogio Bear, MD;  Location: [facility] ORS;  Service: Ophthalmology;  Laterality: Right;  [place] 1.46 AP% 11.5 CDE 12.33 FLUID PACK LOT # [place]:[id number] H  . EYE SURGERY    . FRACTURE SURGERY     RIGHT LEG WITH ROD  . HIP SURGERY    . NEPHRECTOMY RADICAL    . PERIPHERAL VASCULAR CATHETERIZATION N/A 08/28/2015   Procedure: A/V Shuntogram/Fistulagram;  Surgeon: Algernon Huxley, MD;  Location: [place] CV LAB;  Service: Cardiovascular;  Laterality: N/A;  . PERIPHERAL VASCULAR CATHETERIZATION Left 08/28/2015   Procedure: A/V Shunt Intervention;  Surgeon: Algernon Huxley, MD;  Location: [place] CV LAB;  Service: Cardiovascular;  Laterality: Left;    SOCIAL HISTORY:   Social History  Substance Use Topics  . Smoking status: Current Every Day Smoker    Packs/day: 1.00    Types: Cigarettes    Last attempt to quit: 06/26/2012  . Smokeless tobacco: Never  Used  . Alcohol use No    FAMILY HISTORY:   Family History  Problem Relation Age of Onset  . CAD Father   . COPD Father   . CAD Paternal Grandfather   . Diabetes Maternal Aunt   . Diabetes Maternal Uncle     DRUG ALLERGIES:   Allergies  Allergen Reactions  . Metformin Other (See Comments)    Severe diarrhea  . Penicillins Hives    REVIEW OF SYSTEMS:   Review of Systems  Constitutional: Positive for malaise/fatigue. Negative for chills, fever and weight loss.  HENT: Negative for ear discharge, ear pain, hearing loss and nosebleeds.   Eyes: Positive  for blurred vision. Negative for double vision and photophobia.  Respiratory: Negative for cough, hemoptysis, shortness of breath and wheezing.   Cardiovascular: Negative for chest pain, palpitations, orthopnea and leg swelling.  Gastrointestinal: Positive for nausea. Negative for abdominal pain, constipation, diarrhea, heartburn, melena and vomiting.  Genitourinary: Negative for dysuria.  Musculoskeletal: Negative for back pain, myalgias and neck pain.  Skin: Negative for rash.  Neurological: Positive for dizziness and focal weakness. Negative for tingling, tremors, sensory change, speech change and headaches.  Endo/Heme/Allergies: Does not bruise/bleed easily.  Psychiatric/Behavioral: Negative for depression.    MEDICATIONS AT HOME:   Prior to Admission medications   Medication Sig Start Date End Date Taking? Authorizing Provider  amLODipine (NORVASC) 10 MG tablet Take 1 tablet (10 mg total) by mouth at bedtime. 08/17/15  Yes Epifanio Lesches, MD  aspirin EC 81 MG tablet Take 81 mg by mouth daily.   Yes Historical Provider, MD  atorvastatin (LIPITOR) 40 MG tablet Take 40 mg by mouth at bedtime.   Yes Historical Provider, MD  carvedilol (COREG) 12.5 MG tablet Take 37.5 mg by mouth 2 (two) times daily.   Yes Historical Provider, MD  furosemide (LASIX) 40 MG tablet Take 40 mg by mouth 2 (two) times daily.    Yes Historical Provider, MD  hydrALAZINE (APRESOLINE) 50 MG tablet Take 75 mg by mouth 3 (three) times daily.    Yes Historical Provider, MD  insulin glargine (LANTUS) 100 UNIT/ML injection Inject 5 Units into the skin daily.    Yes Historical Provider, MD  isosorbide mononitrate (IMDUR) 30 MG 24 hr tablet Take 1 tablet (30 mg total) by mouth daily. Patient taking differently: Take 30 mg by mouth at bedtime.  08/17/15  Yes Epifanio Lesches, MD      VITAL SIGNS:  Blood pressure (!) 172/64, pulse 63, temperature 98 F (36.7 C), resp. rate 18, height 5\' 8"  (1.727 m), weight 79.4 kg  (175 lb), SpO2 97 %.  PHYSICAL EXAMINATION:   Physical Exam  GENERAL:  55 y.o.-year-old patient sitting in the bed with no acute distress.  EYES: right Pupil round, reactive to light and accommodation. Left cornea opacified. Legally blind in the eye. No scleral icterus. Extraocular muscles intact.  HEENT: Head atraumatic, normocephalic. Oropharynx and nasopharynx clear.  NECK:  Supple, no jugular venous distention. No thyroid enlargement, no tenderness.  LUNGS: Normal breath sounds bilaterally, no wheezing, rales,rhonchi or crepitation. No use of accessory muscles of respiration. Decreased bibasilar breath sounds CARDIOVASCULAR: S1, S2 normal. No  rubs, or gallops. 2/6 systolic murmur is present ABDOMEN: Soft, nontender, nondistended. Bowel sounds present. No organomegaly or mass.  EXTREMITIES: No pedal edema, cyanosis, or clubbing. Right wrist and dorsum of the hand is swollen from recent fall. Left forearm AV fistula is present NEUROLOGIC: Cranial nerves II through XII are intact. Muscle strength  5/5 in all extremities. Sensation intact. Gait not checked.  PSYCHIATRIC: The patient is alert and oriented x 3.  SKIN: No obvious rash, lesion, or ulcer.   LABORATORY PANEL:   CBC  Recent Labs Lab 08/21/16 1050  WBC 7.9  HGB 11.4*  HCT 31.7*  PLT 182   ------------------------------------------------------------------------------------------------------------------  Chemistries   Recent Labs Lab 08/21/16 1050  NA 135  K 3.1*  CL 99*  CO2 28  GLUCOSE 265*  BUN 22*  CREATININE 3.97*  CALCIUM 7.9*  AST 20  ALT 15*  ALKPHOS 125  BILITOT 0.9   ------------------------------------------------------------------------------------------------------------------  Cardiac Enzymes  Recent Labs Lab 08/21/16 1050  TROPONINI 0.03*   ------------------------------------------------------------------------------------------------------------------  RADIOLOGY:  Dg Wrist Complete  Right  Result Date: 08/21/2016 CLINICAL DATA:  Fall with right wrist pain. EXAM: RIGHT WRIST - COMPLETE 3+ VIEW COMPARISON:  None. FINDINGS: There is a comminuted intra-articular right distal radius fracture with mild impaction and slight apex dorsal angulation with no significant displacement. There is a minimally displaced ulnar styloid fracture with 2 mm radial displacement of the fracture fragment. There is prominent soft tissue swelling throughout the right wrist. No dislocation. No suspicious focal osseous lesion. Mild osteoarthritis at the first carpometacarpal joint. Vascular calcifications throughout the soft tissues. IMPRESSION: 1. Comminuted impacted intra-articular right distal radius fracture as described. 2. Minimally displaced right ulnar styloid fracture. Electronically Signed   By: Ilona Sorrel M.D.   On: 08/21/2016 11:59   Ct Head Wo Contrast  Result Date: 08/21/2016 CLINICAL DATA:  55 year old male with right side weakness for less than 3 hours. Code stroke. Initial encounter. EXAM: CT HEAD WITHOUT CONTRAST TECHNIQUE: Contiguous axial images were obtained from the base of the skull through the vertex without intravenous contrast. COMPARISON:  Head CT without contrast 10/27/2012. FINDINGS: Visualized paranasal sinuses and mastoids are stable and well pneumatized. No acute osseous abnormality identified. Interval postoperative changes to both globes, more so the left. Hyper dense and heterogeneous left globe vitreous likely is postoperative. No acute scalp soft tissue findings. Extensive Calcified atherosclerosis at the skull base. Chronic abnormal hypodensity in the bilateral corona radiata, slightly worse on the right and stable. However, new hypodensity and heterogeneity from the posterior limb of the left internal capsule through to the left thalamus is new since 2013. No superimposed acute cortically based infarct identified. No suspicious intracranial vascular hyperdensity. ASPECTS  [facility] Stroke Program Early CT Score) Total score (0-10 with 10 being normal): 10. No acute intracranial hemorrhage identified. No midline shift, mass effect, or evidence of intracranial mass lesion. Stable cerebral volume. IMPRESSION: 1. No acute cortically based infarct or acute intracranial hemorrhage identified. 2. ASPECTS is 10. 3. New since 2013 but age indeterminate small vessel ischemic changes to the posterior limb of the left internal capsule and thalamus. Underlying chronic bilateral corona radiata white matter ischemia. 4. Study discussed by telephone with Dr. Nance Pear on 08/21/2016 at 11:17 . Electronically Signed   By: Genevie Ann M.D.   On: 08/21/2016 11:18    EKG:   Orders placed or performed during the hospital encounter of 08/21/16  . ED EKG  . ED EKG    IMPRESSION AND PLAN:   Richard Zavala  is a 55 y.o. male with a known history of Insulin-dependent diabetes mellitus, end-stage renal disease on Monday Wednesday Friday hemodialysis, hypertension, diastolic CHF, history of Wilms tumor status post left nephrectomy presents to the hospital secondary to right leg weakness that started this morning.   #1 TIA-with right  leg weakness. Has chronic small vessel ischemic disease based on CT head changes. -Appreciate neurology consult. -Already on aspirin at home, add Plavix. Already on statin. -Neuro checks. MRI of the brain and MRA of the brain. -Carotid Dopplers and echocardiogram. -Physical therapy, occupational therapy and speech therapy consults requested  #2 end-stage renal disease on dialysis-on Monday Wednesday Friday dialysis. Last dialysis today. Does not appear to be fluid overloaded at this time.  If stays after tomorrow, consult nephrology for dialysis on Monday  #3 right wrist swelling from the fall-check x-rays  #4 diabetes mellitus-continue Lantus and sliding scale insulin.  #5 hypertension-continue Imdur, hydralazine, Coreg, Norvasc and Lasix  #6 DVT  prophylaxis-on subcutaneous heparin    All the records are reviewed and case discussed with ED provider. Management plans discussed with the patient, family and they are in agreement.  CODE STATUS: Full code  TOTAL TIME TAKING CARE OF THIS PATIENT: 50 minutes.    Gladstone Lighter M.D on 08/21/2016 at 12:26 PM  Between 7am to 6pm - Pager - [phone]  After 6pm go to [email or web address] - password [email or web address] [place] [facility]  Office  [phone]  CC: Primary care physician; Midge Minium, [place]

## 2016-08-21 NOTE — ED Provider Notes (Signed)
[facility] Emergency Department Provider Note    ____________________________________________   I have reviewed the triage vital signs and the nursing notes.   HISTORY  Chief Complaint Extremity Weakness   History limited by: Not Limited   HPI Richard Zavala is a 55 y.o. male who presents to the emergency department today because of concerns for right sided weakness. Patient was at dialysis. When he went to the bathroom and started walking he felt like he was driving his right leg. This happened around 10:00. Additionally felt like he was having some right upper extremity weakness. He feels that the upper extremity weakness has improved. For the past week however the patient is been feeling increasingly weak. He associated chest pain or shortness of breath. No recent fevers. He does have a history of CVAs in the past.    Past Medical History:  Diagnosis Date  . Anemia   . Blind   . BPH (benign prostatic hyperplasia)   . CAD (coronary artery disease)   . CHF (congestive heart failure) ([place])   . Chronic kidney disease (CKD), stage IV (severe) (HCC)    DIALYSIS  . Diabetes mellitus without complication ([place])   . ESRD (end stage renal disease) ([place])    Monday-Wednesday-Friday dialysis  . Hyperlipemia   . Hypertension   . Neuropathy ([place])   . Osteoporosis   . Pulmonary HTN ([place])   . Stroke [facility])    2013  . TIA (transient ischemic attack)   . TRD (traction retinal detachment)   . Wilms' tumor [facility])     Patient Active Problem List   Diagnosis Date Noted  . TIA (transient ischemic attack) 08/21/2016  . Anemia due to other cause   . Hyperkalemia   . Hypoglycemia   . Pain   . Renal failure   . Weakness   . Other chest pain   . Elevated troponin   . Hyperkalemia, diminished renal excretion 08/14/2015    Past Surgical History:  Procedure Laterality Date  . AV FISTULA PLACEMENT    . CARDIAC CATHETERIZATION    . CATARACT EXTRACTION W/PHACO  Right 05/14/2016   Procedure: CATARACT EXTRACTION PHACO AND INTRAOCULAR LENS PLACEMENT (IOC);  Surgeon: Eulogio Bear, MD;  Location: [facility] ORS;  Service: Ophthalmology;  Laterality: Right;  [place] 1.46 AP% 11.5 CDE 12.33 FLUID PACK LOT # [place]:[id number] H  . EYE SURGERY    . FRACTURE SURGERY     RIGHT LEG WITH ROD  . HIP SURGERY    . NEPHRECTOMY RADICAL    . PERIPHERAL VASCULAR CATHETERIZATION N/A 08/28/2015   Procedure: A/V Shuntogram/Fistulagram;  Surgeon: Algernon Huxley, MD;  Location: [place] CV LAB;  Service: Cardiovascular;  Laterality: N/A;  . PERIPHERAL VASCULAR CATHETERIZATION Left 08/28/2015   Procedure: A/V Shunt Intervention;  Surgeon: Algernon Huxley, MD;  Location: [place] CV LAB;  Service: Cardiovascular;  Laterality: Left;    Prior to Admission medications   Medication Sig Start Date End Date Taking? Authorizing Provider  amLODipine (NORVASC) 10 MG tablet Take 1 tablet (10 mg total) by mouth at bedtime. 08/17/15  Yes Epifanio Lesches, MD  aspirin EC 81 MG tablet Take 81 mg by mouth daily.   Yes Historical Provider, MD  atorvastatin (LIPITOR) 40 MG tablet Take 40 mg by mouth at bedtime.   Yes Historical Provider, MD  carvedilol (COREG) 12.5 MG tablet Take 37.5 mg by mouth 2 (two) times daily.   Yes Historical Provider, MD  furosemide (LASIX) 40 MG tablet  Take 40 mg by mouth 2 (two) times daily.    Yes Historical Provider, MD  hydrALAZINE (APRESOLINE) 50 MG tablet Take 75 mg by mouth 3 (three) times daily.    Yes Historical Provider, MD  insulin glargine (LANTUS) 100 UNIT/ML injection Inject 5 Units into the skin daily.    Yes Historical Provider, MD  isosorbide mononitrate (IMDUR) 30 MG 24 hr tablet Take 1 tablet (30 mg total) by mouth daily. Patient taking differently: Take 30 mg by mouth at bedtime.  08/17/15  Yes Epifanio Lesches, MD    Allergies Metformin and Penicillins  Family History  Problem Relation Age of Onset  . CAD Father   . COPD Father   . CAD Paternal  Grandfather   . Diabetes Maternal Aunt   . Diabetes Maternal Uncle     Social History Social History  Substance Use Topics  . Smoking status: Current Every Day Smoker    Packs/day: 1.00    Types: Cigarettes    Last attempt to quit: 06/26/2012  . Smokeless tobacco: Never Used  . Alcohol use No    Review of Systems  Constitutional: Negative for fever. Cardiovascular: Negative for chest pain. Respiratory: Negative for shortness of breath. Gastrointestinal: Negative for abdominal pain, vomiting and diarrhea. Genitourinary: Negative for dysuria. Neurological: Positive for right-sided weakness  10-point ROS otherwise negative.  ____________________________________________   PHYSICAL EXAM:  VITAL SIGNS: ED Triage Vitals  Enc Vitals Group     BP 08/21/16 1046 (!) 172/66     Pulse Rate 08/21/16 1046 63     Resp 08/21/16 1046 20     Temp 08/21/16 1046 98 F (36.7 C)     Temp Source 08/21/16 1046 Oral     SpO2 08/21/16 1046 96 %     Weight 08/21/16 1046 175 lb (79.4 kg)     Height 08/21/16 1046 5\' 8"  (1.727 m)     Head Circumference --      Peak Flow --      Pain Score 08/21/16 1047 1   Constitutional: Alert and oriented. Well appearing and in no distress. Eyes: Left eye with chronic disease and blindness. ENT   Head: Normocephalic and atraumatic.   Nose: No congestion/rhinnorhea.   Mouth/Throat: Mucous membranes are moist.   Neck: No stridor. Hematological/Lymphatic/Immunilogical: No cervical lymphadenopathy. Cardiovascular: Normal rate, regular rhythm.  No murmurs, rubs, or gallops. Respiratory: Normal respiratory effort without tachypnea nor retractions. Breath sounds are clear and equal bilaterally. No wheezes/rales/rhonchi. Gastrointestinal: Soft and nontender. No distention.  Genitourinary: Deferred Musculoskeletal: Normal range of motion in all extremities. No lower extremity edema. Neurologic:  Normal speech and language. Strength 5 out of 5 in  upper and lower extremities. No drift. Sensation grossly intact. No gross focal neurologic deficits are appreciated.  Skin:  Skin is warm, dry and intact. No rash noted. Psychiatric: Mood and affect are normal. Speech and behavior are normal. Patient exhibits appropriate insight and judgment.  ____________________________________________    LABS (pertinent positives/negatives)  Labs Reviewed  APTT - Abnormal; Notable for the following:       Result Value   aPTT 37 (*)    All other components within normal limits  CBC - Abnormal; Notable for the following:    RBC 3.50 (*)    Hemoglobin 11.4 (*)    HCT 31.7 (*)    MCHC 36.1 (*)    All other components within normal limits  DIFFERENTIAL - Abnormal; Notable for the following:    Lymphs Abs 0.9 (*)  All other components within normal limits  COMPREHENSIVE METABOLIC PANEL - Abnormal; Notable for the following:    Potassium 3.1 (*)    Chloride 99 (*)    Glucose, Bld 265 (*)    BUN 22 (*)    Creatinine, Ser 3.97 (*)    Calcium 7.9 (*)    Albumin 3.1 (*)    ALT 15 (*)    GFR calc non Af Amer 16 (*)    GFR calc Af Amer 18 (*)    All other components within normal limits  TROPONIN I - Abnormal; Notable for the following:    Troponin I 0.03 (*)    All other components within normal limits  PROTIME-INR  LIPID PANEL  HEMOGLOBIN A1C  CBG MONITORING, ED     ____________________________________________   EKG  CT head   ____________________________________________    RADIOLOGY  CT head IMPRESSION:  1. No acute cortically based infarct or acute intracranial  hemorrhage identified.  2. ASPECTS is 10.  3. New since 2013 but age indeterminate small vessel ischemic  changes to the posterior limb of the left internal capsule and  thalamus. Underlying chronic bilateral corona radiata white matter  ischemia.   I, Nance Pear, personally discussed these images and results by phone with the on-call radiologist and used  this discussion as part of my medical decision making.    ____________________________________________   PROCEDURES  Procedures  ____________________________________________   INITIAL IMPRESSION / ASSESSMENT AND PLAN / ED COURSE  Pertinent labs & imaging results that were available during my care of the patient were reviewed by me and considered in my medical decision making (see chart for details).  Patient resented to the emergency department today from dialysis because of concerns for right leg weakness. Patient was called a code stroke. Patient without any focal weakness on exam. Will plan on admission for stroke workup.  ____________________________________________   FINAL CLINICAL IMPRESSION(S) / ED DIAGNOSES  Final diagnoses:  Right leg weakness     Note: This dictation was prepared with Dragon dictation. Any transcriptional errors that result from this process are unintentional    Nance Pear, MD 08/21/16 [phone]

## 2016-08-21 NOTE — Consult Note (Signed)
Referring Physician: Tressia Miners    Chief Complaint: Right sided weakness  HPI: Richard Zavala is an 55 y.o. male who reports that he awakened vertiginous at about 0400 on the morning of 8/26.  Vertigo was severe enough to make him miss dialysis but has been slowly improving.  Had an episode on the 28th when coming back from the bathroom that he lost his balance, fell to the right and hurt his arm.  Has been unable to move the arm well since that time but it has been slowly improving as well.  Today went to the bathroom during a break from dialysis and on the way back felt that he was dragging his right leg.  Patient was sent for evaluation at that time.  Had improved by presentation.  Initial NIHSS of 3.     Date last known well: Date: 08/14/2016 Time last known well: Time: 23:00 tPA Given: No: Outside time window  Past Medical History:  Diagnosis Date  . Anemia   . Blind   . BPH (benign prostatic hyperplasia)   . CAD (coronary artery disease)   . CHF (congestive heart failure) ([place])   . Chronic kidney disease (CKD), stage IV (severe) ([facility])    DIALYSIS  . Diabetes mellitus without complication ([place])   . Hyperlipemia   . Hypertension   . Neuropathy ([place])   . Osteoporosis   . Pulmonary HTN ([place])   . Stroke [facility])    2013  . TIA (transient ischemic attack)   . TRD (traction retinal detachment)   . Wilms' tumor [facility])     Past Surgical History:  Procedure Laterality Date  . AV FISTULA PLACEMENT    . CARDIAC CATHETERIZATION    . CATARACT EXTRACTION W/PHACO Right 05/14/2016   Procedure: CATARACT EXTRACTION PHACO AND INTRAOCULAR LENS PLACEMENT (IOC);  Surgeon: Eulogio Bear, MD;  Location: [facility] ORS;  Service: Ophthalmology;  Laterality: Right;  [place] 1.46 AP% 11.5 CDE 12.33 FLUID PACK LOT # [place]:[id number]  . EYE SURGERY    . FRACTURE SURGERY     RIGHT LEG WITH ROD  . HIP SURGERY    . NEPHRECTOMY RADICAL    . PERIPHERAL VASCULAR CATHETERIZATION N/A 08/28/2015   Procedure: A/V  Shuntogram/Fistulagram;  Surgeon: Algernon Huxley, MD;  Location: [place] [facility];  Service: Cardiovascular;  Laterality: N/A;  . PERIPHERAL VASCULAR CATHETERIZATION Left 08/28/2015   Procedure: A/V Shunt Intervention;  Surgeon: Algernon Huxley, MD;  Location: [place] CV LAB;  Service: Cardiovascular;  Laterality: Left;    Family History  Problem Relation Age of Onset  . CAD Father   . COPD Father   . CAD Paternal Grandfather   . Diabetes Maternal Aunt   . Diabetes Maternal Uncle    Social History:  reports that he quit smoking about 4 years ago. He has never used smokeless tobacco. He reports that he does not drink alcohol. His drug history is not on file.  Allergies:  Allergies  Allergen Reactions  . Metformin Other (See Comments)    Severe diarrhea  . Penicillins Hives    Medications: I have reviewed the patient's current medications. Prior to Admission:  Prior to Admission medications   Medication Sig Start Date End Date Taking? Authorizing Provider  amLODipine (NORVASC) 10 MG tablet Take 1 tablet (10 mg total) by mouth at bedtime. 08/17/15  Yes Epifanio Lesches, MD  aspirin EC 81 MG tablet Take 81 mg by mouth daily.   Yes Historical Provider, MD  atorvastatin (  LIPITOR) 40 MG tablet Take 40 mg by mouth at bedtime.   Yes Historical Provider, MD  carvedilol (COREG) 12.5 MG tablet Take 37.5 mg by mouth 2 (two) times daily.   Yes Historical Provider, MD  furosemide (LASIX) 40 MG tablet Take 40 mg by mouth 2 (two) times daily.    Yes Historical Provider, MD  hydrALAZINE (APRESOLINE) 50 MG tablet Take 75 mg by mouth 3 (three) times daily.    Yes Historical Provider, MD  insulin glargine (LANTUS) 100 UNIT/ML injection Inject 5 Units into the skin daily.    Yes Historical Provider, MD  isosorbide mononitrate (IMDUR) 30 MG 24 hr tablet Take 1 tablet (30 mg total) by mouth daily. Patient taking differently: Take 30 mg by mouth at bedtime.  08/17/15  Yes Epifanio Lesches, MD     ROS: History obtained from the patient  General ROS: negative for - chills, fatigue, fever, night sweats, weight gain or weight loss Psychological ROS: negative for - behavioral disorder, hallucinations, memory difficulties, mood swings or suicidal ideation Ophthalmic ROS: blind left eye ENT ROS: negative for - epistaxis, nasal discharge, oral lesions, sore throat, tinnitus Allergy and Immunology ROS: negative for - hives or itchy/watery eyes Hematological and Lymphatic ROS: negative for - bleeding problems, bruising or swollen lymph nodes Endocrine ROS: negative for - galactorrhea, hair pattern changes, polydipsia/polyuria or temperature intolerance Respiratory ROS: negative for - cough, hemoptysis, shortness of breath or wheezing Cardiovascular ROS: negative for - chest pain, dyspnea on exertion, edema or irregular heartbeat Gastrointestinal ROS: negative for - abdominal pain, diarrhea, hematemesis, nausea/vomiting or stool incontinence Genito-Urinary ROS: negative for - dysuria, hematuria, incontinence or urinary frequency/urgency Musculoskeletal ROS: occasional neuropathy pain, right wrist pain Neurological ROS: as noted in HPI Dermatological ROS: negative for rash and skin lesion changes  Physical Examination: Blood pressure (!) 172/64, pulse 63, temperature 98 F (36.7 C), resp. rate 18, height 5\' 8"  (1.727 m), weight 79.4 kg (175 lb), SpO2 97 %.  HEENT-  Normocephalic, no lesions, without obvious abnormality.  Normal external eye and conjunctiva.  Normal TM's bilaterally.  Normal auditory canals and external ears. Normal external nose, mucus membranes and septum.  Normal pharynx. Cardiovascular- S1, S2 normal, pulses palpable throughout   Lungs- chest clear, no wheezing, rales, normal symmetric air entry Abdomen- soft, non-tender; bowel sounds normal; no masses,  no organomegaly Extremities- RUE swelling Lymph-no adenopathy palpable Musculoskeletal-right wrist  tenderness Skin-bruising right arm  Neurological Examination Mental Status: Alert, oriented, thought content appropriate.  Speech fluent without evidence of aphasia.  Able to follow 3 step commands without difficulty. Cranial Nerves: II: Disc flat on the right, unable to be visualized on the left due to clouding; Visual fields grossly normal in the right eye, right pupil round, reactive to light  III,IV, VI: ptosis not present, extra-ocular motions intact bilaterally V,VII: right facial droop, facial light touch sensation normal bilaterally VIII: hearing normal bilaterally IX,X: gag reflex present XI: bilateral shoulder shrug XII: midline tongue extension Motor: Right : Upper extremity   5/5 proximally with decreased hand grip (3/5) and wrist movement that patient attributed to pain      Lower extremity   5/5   Left 5/5    Atrophy noted in the hand intrinsics, left greater than  right Sensory: Pinprick and light touch intact throughout, bilaterally Deep Tendon Reflexes: 2+ throughout with absent ankle jerks bilaterally Plantars: Right: mute   Left: mute Cerebellar: Normal finger-to-nose testing bilaterally, dysmetric heel-to-shin testing bilaterally Gait: not tested due to  safety concerns    Laboratory Studies:  Basic Metabolic Panel:  Recent Labs Lab 08/21/16 1050  NA 135  K 3.1*  CL 99*  CO2 28  GLUCOSE 265*  BUN 22*  CREATININE 3.97*  CALCIUM 7.9*    Liver Function Tests:  Recent Labs Lab 08/21/16 1050  AST 20  ALT 15*  ALKPHOS 125  BILITOT 0.9  PROT 6.5  ALBUMIN 3.1*   No results for input(s): LIPASE, AMYLASE in the last 168 hours. No results for input(s): AMMONIA in the last 168 hours.  CBC:  Recent Labs Lab 08/21/16 1050  WBC 7.9  NEUTROABS 5.6  HGB 11.4*  HCT 31.7*  MCV 90.4  PLT 182    Cardiac Enzymes:  Recent Labs Lab 08/21/16 1050  TROPONINI 0.03*    BNP: Invalid input(s): POCBNP  CBG: No results for input(s): GLUCAP in the  last 168 hours.  Microbiology: Results for orders placed or performed during the hospital encounter of 08/14/15  MRSA PCR Screening     Status: None   Collection Time: 08/14/15  8:00 PM  Result Value Ref Range Status   MRSA by PCR NEGATIVE NEGATIVE Final    Comment:        The GeneXpert MRSA Assay (FDA approved for NASAL specimens only), is one component of a comprehensive MRSA colonization surveillance program. It is not intended to diagnose MRSA infection nor to guide or monitor treatment for MRSA infections.     Coagulation Studies:  Recent Labs  08/21/16 1050  LABPROT 14.0  INR 1.08    Urinalysis: No results for input(s): COLORURINE, LABSPEC, PHURINE, GLUCOSEU, HGBUR, BILIRUBINUR, KETONESUR, PROTEINUR, UROBILINOGEN, NITRITE, LEUKOCYTESUR in the last 168 hours.  Invalid input(s): APPERANCEUR  Lipid Panel: No results found for: CHOL, TRIG, HDL, CHOLHDL, VLDL, LDLCALC  HgbA1C:  Lab Results  Component Value Date   HGBA1C 8.3 (H) 10/28/2012    Urine Drug Screen:     Component Value Date/Time   LABOPIA NONE DETECTED 08/14/2015 1945   COCAINSCRNUR NONE DETECTED 08/14/2015 1945   LABBENZ NONE DETECTED 08/14/2015 1945   AMPHETMU NONE DETECTED 08/14/2015 1945   THCU NONE DETECTED 08/14/2015 1945   LABBARB NONE DETECTED 08/14/2015 1945    Alcohol Level: No results for input(s): ETH in the last 168 hours.  Other results: EKG: sinus bradycardia at 59 bpm.  Imaging: Ct Head Wo Contrast  Result Date: 08/21/2016 CLINICAL DATA:  55 year old male with right side weakness for less than 3 hours. Code stroke. Initial encounter. EXAM: CT HEAD WITHOUT CONTRAST TECHNIQUE: Contiguous axial images were obtained from the base of the skull through the vertex without intravenous contrast. COMPARISON:  Head CT without contrast 10/27/2012. FINDINGS: Visualized paranasal sinuses and mastoids are stable and well pneumatized. No acute osseous abnormality identified. Interval  postoperative changes to both globes, more so the left. Hyper dense and heterogeneous left globe vitreous likely is postoperative. No acute scalp soft tissue findings. Extensive Calcified atherosclerosis at the skull base. Chronic abnormal hypodensity in the bilateral corona radiata, slightly worse on the right and stable. However, new hypodensity and heterogeneity from the posterior limb of the left internal capsule through to the left thalamus is new since 2013. No superimposed acute cortically based infarct identified. No suspicious intracranial vascular hyperdensity. ASPECTS [facility] Stroke Program Early CT Score) Total score (0-10 with 10 being normal): 10. No acute intracranial hemorrhage identified. No midline shift, mass effect, or evidence of intracranial mass lesion. Stable cerebral volume. IMPRESSION: 1. No acute cortically based infarct  or acute intracranial hemorrhage identified. 2. ASPECTS is 10. 3. New since 2013 but age indeterminate small vessel ischemic changes to the posterior limb of the left internal capsule and thalamus. Underlying chronic bilateral corona radiata white matter ischemia. 4. Study discussed by telephone with Dr. Nance Pear on 08/21/2016 at 11:17 . Electronically Signed   By: Genevie Ann M.D.   On: 08/21/2016 11:18    Assessment: 55 y.o. male presenting after an episode of right leg weakness.  Has had vertigo for the past week.  Weakness has resolved but patient has had enough focal symptoms recently that further work up indicated.  Head CT personally reviewed and shows no acute changes.  Patient on ASA at home.    Stroke Risk Factors - diabetes mellitus, hyperlipidemia and hypertension  Plan: 1. HgbA1c, fasting lipid panel 2. MRI, MRA  of the brain without contrast 3. PT consult, OT consult, Speech consult 4. Echocardiogram 5. Carotid dopplers 6. Prophylactic therapy-Antiplatelet med: Plavix - dose 75mg  daily 7. NPO until RN stroke swallow screen 8. Telemetry  monitoring 9. Frequent neuro checks, orthostatic BP with heart rate 10. Further investigation of right wrist   Alexis Goodell, MD Neurology [phone] 08/21/2016, 11:53 AM

## 2016-08-22 ENCOUNTER — Observation Stay: Payer: Self-pay

## 2016-08-22 DIAGNOSIS — S52501A Unspecified fracture of the lower end of right radius, initial encounter for closed fracture: Secondary | ICD-10-CM | POA: Diagnosis present

## 2016-08-22 DIAGNOSIS — I63331 Cerebral infarction due to thrombosis of right posterior cerebral artery: Secondary | ICD-10-CM

## 2016-08-22 LAB — CBC
HEMATOCRIT: 31.2 % — AB (ref 40.0–52.0)
Hemoglobin: 10.9 g/dL — ABNORMAL LOW (ref 13.0–18.0)
MCH: 32 pg (ref 26.0–34.0)
MCHC: 34.9 g/dL (ref 32.0–36.0)
MCV: 91.6 fL (ref 80.0–100.0)
Platelets: 176 10*3/uL (ref 150–440)
RBC: 3.4 MIL/uL — ABNORMAL LOW (ref 4.40–5.90)
RDW: 14.3 % (ref 11.5–14.5)
WBC: 6.5 10*3/uL (ref 3.8–10.6)

## 2016-08-22 LAB — BASIC METABOLIC PANEL
Anion gap: 7 (ref 5–15)
BUN: 30 mg/dL — AB (ref 6–20)
CO2: 29 mmol/L (ref 22–32)
Calcium: 7.6 mg/dL — ABNORMAL LOW (ref 8.9–10.3)
Chloride: 102 mmol/L (ref 101–111)
Creatinine, Ser: 5.17 mg/dL — ABNORMAL HIGH (ref 0.61–1.24)
GFR calc Af Amer: 13 mL/min — ABNORMAL LOW (ref >60)
GFR, EST NON AFRICAN AMERICAN: 12 mL/min — AB (ref >60)
GLUCOSE: 84 mg/dL (ref 65–99)
POTASSIUM: 2.9 mmol/L — AB (ref 3.5–5.1)
Sodium: 138 mmol/L (ref 135–145)

## 2016-08-22 LAB — GLUCOSE, CAPILLARY
Glucose-Capillary: 93 mg/dL (ref 65–99)
Glucose-Capillary: 122 mg/dL — ABNORMAL HIGH (ref 65–99)

## 2016-08-22 LAB — HEMOGLOBIN A1C: HEMOGLOBIN A1C: 5.3 % (ref 4.0–6.0)

## 2016-08-22 MED ORDER — ATORVASTATIN CALCIUM 80 MG PO TABS: 80.0000 mg | ORAL_TABLET | Freq: Every day | ORAL | 2 refills | Status: DC

## 2016-08-22 MED ORDER — POTASSIUM CHLORIDE CRYS ER 20 MEQ PO TBCR
40.0000 meq | EXTENDED_RELEASE_TABLET | Freq: Once | ORAL | Status: AC
  Administered 2016-08-22: 14:00:00 40 meq via ORAL
  Filled 2016-08-22: qty 2

## 2016-08-22 MED ORDER — SPIRONOLACTONE 25 MG PO TABS: 25.0000 mg | ORAL_TABLET | Freq: Every day | ORAL | 1 refills | Status: DC

## 2016-08-22 MED ORDER — CLOPIDOGREL BISULFATE 75 MG PO TABS: 75.0000 mg | ORAL_TABLET | Freq: Every day | ORAL | 2 refills | Status: DC

## 2016-08-22 MED ORDER — SPIRONOLACTONE 25 MG PO TABS
25.0000 mg | ORAL_TABLET | Freq: Every day | ORAL | Status: DC
Start: 2016-08-22 — End: 2016-08-22
  Administered 2016-08-22: 25 mg via ORAL
  Filled 2016-08-22: qty 1

## 2016-08-22 NOTE — Discharge Summary (Signed)
[place] [facility] [place] NAME: Richard Zavala    MR#:  [id number]  DATE OF BIRTH:  04/10/1961  DATE OF ADMISSION:  08/21/2016   ADMITTING PHYSICIAN: Gladstone Lighter, MD  DATE OF DISCHARGE: 08/22/2016  PRIMARY CARE PHYSICIAN: Midge Minium, PA   ADMISSION DIAGNOSIS:  TIA (transient ischemic attack) [G45.9] CVA (cerebral infarction) [I63.9] Right leg weakness [R29.898] DISCHARGE DIAGNOSIS:  Active Problems:   TIA (transient ischemic attack)   Closed fracture of right distal radius acute CVA. right distal radius and ulnar fracture SECONDARY DIAGNOSIS:   Past Medical History:  Diagnosis Date  . Anemia   . Blind   . BPH (benign prostatic hyperplasia)   . CAD (coronary artery disease)   . CHF (congestive heart failure) ([place])   . Chronic kidney disease (CKD), stage IV (severe) (H[facility])    DIALYSIS  . Diabetes mellitus without complication ([place])   . ESRD (end stage renal disease) ([place])    Monday-Wednesday-Friday dialysis  . Hyperlipemia   . Hypertension   . Neuropathy ([place])   . Osteoporosis   . Pulmonary HTN ([place])   . Stroke [facility])    2013  . TIA (transient ischemic attack)   . TRD (traction retinal detachment)   . Wilms' tumor [facility])    HOSPITAL COURSE:  Richard Zavala  is a 55 y.o. male with a known history of Insulin-dependent diabetes mellitus, end-stage renal disease on Monday Wednesday Friday hemodialysis, hypertension, diastolic CHF, history of Wilms tumor status post left nephrectomy presents to the hospital secondary to right leg weakness.   #1 Acute CVA with right leg weakness.  MRI brain: Small acute lacunar type infarct of the right cerebellar peduncle.  -Already on aspirin at home, added Plavix. Already on statin, increase lipitor to 80 mg daily. -Neuro checks. MRI of the brain and MRA of the brain. -Carotid Dopplers: Mild to moderate amount of calcified plaque bilaterally at the level of the carotid bulbs and  proximal internal carotid arteries. Proximal ICA stenoses are estimated to be less than 50% bilaterally. pending echocardiogram. -Physical therapy evaluation suggests skilled nursing facility. But the patient doesn't want to go to a facility and that he has no insurance.  #2 end-stage renal disease on dialysis-on Monday Wednesday Friday dialysis.  Add spironolactone per Dr. Candiss Norse.  #3 right distal radius and ulnar fracture. Splint per ortho MD. follow-up as outpatient.  #4 diabetes mellitus-continue Lantus and sliding scale insulin.  #5 hypertension-continue Imdur, hydralazine, Coreg, Norvasc and Lasix, add spironolactone per Dr. Candiss Norse.  Hypokalemia. Give potassium by mouth 1 dose. Follow-up BMP as outpatient  I discussed with the case manager and Education officer, museum. Case manager will try to get home health and PT for the patient. DISCHARGE CONDITIONS:  Stable, discharge to home today. CONSULTS OBTAINED:  Treatment Team:  Catarina Hartshorn, MD Alexis Goodell, MD Lavella Hammock, MD DRUG ALLERGIES:   Allergies  Allergen Reactions  . Metformin Other (See Comments)    Severe diarrhea  . Penicillins Hives   DISCHARGE MEDICATIONS:     Medication List    TAKE these medications   amLODipine 10 MG tablet Commonly known as:  NORVASC Take 1 tablet (10 mg total) by mouth at bedtime.   aspirin EC 81 MG tablet Take 81 mg by mouth daily.   atorvastatin 80 MG tablet Commonly known as:  LIPITOR Take 1 tablet (80 mg total) by mouth daily. What changed:  medication strength  how much to take  when  to take this   clopidogrel 75 MG tablet Commonly known as:  PLAVIX Take 1 tablet (75 mg total) by mouth daily.   COREG 12.5 MG tablet Generic drug:  carvedilol Take 37.5 mg by mouth 2 (two) times daily.   furosemide 40 MG tablet Commonly known as:  LASIX Take 40 mg by mouth 2 (two) times daily.   hydrALAZINE 50 MG tablet Commonly known as:  APRESOLINE Take 75 mg by mouth 3  (three) times daily.   isosorbide mononitrate 30 MG 24 hr tablet Commonly known as:  IMDUR Take 1 tablet (30 mg total) by mouth daily. What changed:  when to take this   LANTUS 100 UNIT/ML injection Generic drug:  insulin glargine Inject 5 Units into the skin daily.   spironolactone 25 MG tablet Commonly known as:  ALDACTONE Take 1 tablet (25 mg total) by mouth daily.        DISCHARGE INSTRUCTIONS:   DIET:  Health and ADA diet DISCHARGE CONDITION:  Stable ACTIVITY:  As tolerated DISCHARGE LOCATION:    If you experience worsening of your admission symptoms, develop shortness of breath, life threatening emergency, suicidal or homicidal thoughts you must seek medical attention immediately by calling 911 or calling your MD immediately  if symptoms less severe.  You Must read complete instructions/literature along with all the possible adverse reactions/side effects for all the Medicines you take and that have been prescribed to you. Take any new Medicines after you have completely understood and accpet all the possible adverse reactions/side effects.   Please note  You were cared for by a hospitalist during your hospital stay. If you have any questions about your discharge medications or the care you received while you were in the hospital after you are discharged, you can call the unit and asked to speak with the hospitalist on call if the hospitalist that took care of you is not available. Once you are discharged, your primary care physician will handle any further medical issues. Please note that NO REFILLS for any discharge medications will be authorized once you are discharged, as it is imperative that you return to your primary care physician (or establish a relationship with a primary care physician if you do not have one) for your aftercare needs so that they can reassess your need for medications and monitor your lab values.    On the day of Discharge:  VITAL SIGNS:    Blood pressure (!) 160/78, pulse (!) 58, temperature 98.4 F (36.9 C), temperature source Oral, resp. rate 20, height 5\' 8"  (1.727 m), weight 175 lb (79.4 kg), SpO2 96 %. PHYSICAL EXAMINATION:  GENERAL:  55 y.o.-year-old patient lying in the bed with no acute distress.  EYES: Pupils equal, round, reactive to light and accommodation. No scleral icterus. Extraocular muscles intact.  HEENT: Head atraumatic, normocephalic. Oropharynx and nasopharynx clear.  NECK:  Supple, no jugular venous distention. No thyroid enlargement, no tenderness.  LUNGS: Normal breath sounds bilaterally, no wheezing, rales,rhonchi or crepitation. No use of accessory muscles of respiration.  CARDIOVASCULAR: S1, S2 normal. No murmurs, rubs, or gallops.  ABDOMEN: Soft, non-tender, non-distended. Bowel sounds present. No organomegaly or mass.  EXTREMITIES: No pedal edema, cyanosis, or clubbing. Right arm in splint. NEUROLOGIC: Cranial nerves II through XII are intact. Muscle strength 5/5 in all extremities. Sensation intact. Gait not checked.  PSYCHIATRIC: The patient is alert and oriented x 3.  SKIN: No obvious rash, lesion, or ulcer.  DATA REVIEW:   CBC  Recent Labs  Lab 08/22/16 0449  WBC 6.5  HGB 10.9*  HCT 31.2*  PLT 176    Chemistries   Recent Labs Lab 08/21/16 1050 08/22/16 0449  NA 135 138  K 3.1* 2.9*  CL 99* 102  CO2 28 29  GLUCOSE 265* 84  BUN 22* 30*  CREATININE 3.97* 5.17*  CALCIUM 7.9* 7.6*  AST 20  --   ALT 15*  --   ALKPHOS 125  --   BILITOT 0.9  --      Microbiology Results  Results for orders placed or performed during the hospital encounter of 08/21/16  MRSA PCR Screening     Status: None   Collection Time: 08/21/16  6:22 PM  Result Value Ref Range Status   MRSA by PCR NEGATIVE NEGATIVE Final    Comment:        The GeneXpert MRSA Assay (FDA approved for NASAL specimens only), is one component of a comprehensive MRSA colonization surveillance program. It is not intended  to diagnose MRSA infection nor to guide or monitor treatment for MRSA infections.     RADIOLOGY:  Mr Virgel Paling [id number] Contrast  Result Date: 08/21/2016 CLINICAL DATA:  55 year old male with acute onset right side weakness. Small vessel ischemia on noncontrast head CT. Initial encounter. EXAM: MRI HEAD WITHOUT CONTRAST MRA HEAD WITHOUT CONTRAST TECHNIQUE: Multiplanar, multiecho pulse sequences of the brain and surrounding structures were obtained without intravenous contrast. Angiographic images of the head were obtained using MRA technique without contrast. COMPARISON:  Head CT without contrast 1107 hours today. FINDINGS: MRI HEAD FINDINGS Major intracranial vascular flow voids appear preserved. No supratentorial restricted diffusion. There is a 7 mm area of restricted diffusion along the medial aspect of the right cerebellar peduncle, near the dorsal right brainstem. See series 106, image 15. No other posterior fossa restricted diffusion. Associated T2 and FLAIR hyperintensity without associated hemorrhage or mass effect. Chronic lacunar infarcts in the inferior right cerebellum (series 15, image 14), left thalamus, bilateral basal ganglia, bilateral posterior limbs of the internal capsules, and bilateral corona radiata. Small chronic micro hemorrhages in the bilateral basal ganglia. No midline shift, mass effect, evidence of mass lesion, ventriculomegaly, extra-axial collection or acute intracranial hemorrhage. Cervicomedullary junction and pituitary are within normal limits. Negative visualized cervical spine. Visible internal auditory structures appear normal. Trace mastoid fluid. Negative nasopharynx. Trace paranasal sinus mucosal thickening. Postoperative changes to both globes, extensive on the left. No acute scalp soft tissue findings. Normal bone marrow signal. MRA HEAD FINDINGS Antegrade flow in the posterior circulation with codominant distal vertebral arteries. Normal PICA origins. There is mild to  moderate irregularity and stenosis of the distal right vertebral artery near the PICA origin. No distal left vertebral artery stenosis despite mild irregularity. Mild irregularity of the basilar artery without stenosis. AICA, SCA, and PCA origins are normal. Posterior communicating arteries are diminutive or absent. Normal PCA branches. Antegrade flow in both ICA siphons. Moderate cavernous segment irregularity with mild to moderate stenosis on the left. Normal ophthalmic artery origins. No right siphon stenosis. Patent carotid termini. Normal MCA and ACA origins. Anterior communicating artery and visualized ACA branches are within normal limits. Bilateral MCA M1 segments and bifurcations are normal. Visualized left MCA branches are within normal limits. Visualized right MCA branches are within normal limits. IMPRESSION: 1. Small acute lacunar type infarct of the right cerebellar peduncle. No associated hemorrhage or mass effect. 2. Underlying very age advanced chronic small vessel disease including lacunar infarcts in the right cerebellum, bilateral deep  gray matter nuclei, and bilateral white matter capsules. Bilateral basal ganglia chronic microhemorrhage. 3. Intracranial atherosclerosis with mild to moderate stenosis of the distal right vertebral artery and left ICA siphon. Electronically Signed   By: Genevie Ann M.D.   On: 08/21/2016 13:59   Mr Brain Wo Contrast  Result Date: 08/21/2016 CLINICAL DATA:  55 year old male with acute onset right side weakness. Small vessel ischemia on noncontrast head CT. Initial encounter. EXAM: MRI HEAD WITHOUT CONTRAST MRA HEAD WITHOUT CONTRAST TECHNIQUE: Multiplanar, multiecho pulse sequences of the brain and surrounding structures were obtained without intravenous contrast. Angiographic images of the head were obtained using MRA technique without contrast. COMPARISON:  Head CT without contrast 1107 hours today. FINDINGS: MRI HEAD FINDINGS Major intracranial vascular flow voids  appear preserved. No supratentorial restricted diffusion. There is a 7 mm area of restricted diffusion along the medial aspect of the right cerebellar peduncle, near the dorsal right brainstem. See series 106, image 15. No other posterior fossa restricted diffusion. Associated T2 and FLAIR hyperintensity without associated hemorrhage or mass effect. Chronic lacunar infarcts in the inferior right cerebellum (series 15, image 14), left thalamus, bilateral basal ganglia, bilateral posterior limbs of the internal capsules, and bilateral corona radiata. Small chronic micro hemorrhages in the bilateral basal ganglia. No midline shift, mass effect, evidence of mass lesion, ventriculomegaly, extra-axial collection or acute intracranial hemorrhage. Cervicomedullary junction and pituitary are within normal limits. Negative visualized cervical spine. Visible internal auditory structures appear normal. Trace mastoid fluid. Negative nasopharynx. Trace paranasal sinus mucosal thickening. Postoperative changes to both globes, extensive on the left. No acute scalp soft tissue findings. Normal bone marrow signal. MRA HEAD FINDINGS Antegrade flow in the posterior circulation with codominant distal vertebral arteries. Normal PICA origins. There is mild to moderate irregularity and stenosis of the distal right vertebral artery near the PICA origin. No distal left vertebral artery stenosis despite mild irregularity. Mild irregularity of the basilar artery without stenosis. AICA, SCA, and PCA origins are normal. Posterior communicating arteries are diminutive or absent. Normal PCA branches. Antegrade flow in both ICA siphons. Moderate cavernous segment irregularity with mild to moderate stenosis on the left. Normal ophthalmic artery origins. No right siphon stenosis. Patent carotid termini. Normal MCA and ACA origins. Anterior communicating artery and visualized ACA branches are within normal limits. Bilateral MCA M1 segments and  bifurcations are normal. Visualized left MCA branches are within normal limits. Visualized right MCA branches are within normal limits. IMPRESSION: 1. Small acute lacunar type infarct of the right cerebellar peduncle. No associated hemorrhage or mass effect. 2. Underlying very age advanced chronic small vessel disease including lacunar infarcts in the right cerebellum, bilateral deep gray matter nuclei, and bilateral white matter capsules. Bilateral basal ganglia chronic microhemorrhage. 3. Intracranial atherosclerosis with mild to moderate stenosis of the distal right vertebral artery and left ICA siphon. Electronically Signed   By: Genevie Ann M.D.   On: 08/21/2016 13:59   US Carotid Bilateral  Result Date: 08/22/2016 CLINICAL DATA:  Cerebral infarction. EXAM: BILATERAL CAROTID DUPLEX ULTRASOUND TECHNIQUE: Pearline Cables scale imaging, color Doppler and duplex ultrasound were performed of bilateral carotid and vertebral arteries in the neck. COMPARISON:  None. FINDINGS: Criteria: Quantification of carotid stenosis is based on velocity parameters that correlate the residual internal carotid diameter with NASCET-based stenosis levels, using the diameter of the distal internal carotid lumen as the denominator for stenosis measurement. The following velocity measurements were obtained: RIGHT ICA:  137/28 mid, 107/23 proximal cm/sec CCA:  [id number] cm/sec SYSTOLIC  ICA/CCA RATIO:  0.9 DIASTOLIC ICA/CCA RATIO:  2.0 ECA:  211 cm/sec LEFT ICA:  182/29 mid, 82/16 proximal cm/sec CCA:  [id number] cm/sec SYSTOLIC ICA/CCA RATIO:  1.3 DIASTOLIC ICA/CCA RATIO:  1.5 ECA:  322 cm/sec RIGHT CAROTID ARTERY: Mild to moderate amount of calcified plaque is identified in the distal common carotid artery, carotid bulb and at the right ICA origin. Based on velocities, estimated proximal right ICA stenosis is less than 50%. The elevated mid right ICA velocity appears to relate to tortuosity and turbulence rather than atherosclerotic stenosis. RIGHT  VERTEBRAL ARTERY: Antegrade flow with normal waveform and velocity. LEFT CAROTID ARTERY: There is a mild to moderate amount of calcified plaque at the level of the left carotid bulb and extending into the proximal left ICA. Estimated left ICA stenosis is less than 50%. Focal elevated velocities in the mid ICA are secondary to tortuosity. LEFT VERTEBRAL ARTERY: Antegrade flow with normal waveform and velocity. IMPRESSION: Mild to moderate amount of calcified plaque bilaterally at the level of the carotid bulbs and proximal internal carotid arteries. Proximal ICA stenoses are estimated to be less than 50% bilaterally. Electronically Signed   By: Aletta Edouard M.D.   On: 08/22/2016 09:44     Management plans discussed with the patient, family and they are in agreement.  CODE STATUS:     Code Status Orders        Start     Ordered   08/21/16 1535  Full code  Continuous     08/21/16 1534    Code Status History    Date Active Date Inactive Code Status Order ID Comments User Context   08/21/2016  3:34 PM 08/21/2016  3:57 PM Full Code [id number]  Gladstone Lighter, MD Inpatient   08/28/2015 10:28 AM 08/28/2015  2:07 PM Full Code [id number]  Algernon Huxley, MD Inpatient   08/14/2015  8:29 AM 08/17/2015  6:47 PM Full Code [id number]  Epifanio Lesches, MD ED      TOTAL TIME TAKING CARE OF THIS PATIENT: 36 minutes.    Demetrios Loll M.D on 08/22/2016 at 1:06 PM  Between 7am to 6pm - Pager - [phone]  After 6pm go to [email or web address] - Proofreader  [employer] [facility]  Hospitalists  Office  [phone]  CC: Primary care physician; Midge Minium, PA   Note: This dictation was prepared with Dragon dictation along with smaller phrase technology. Any transcriptional errors that result from this process are unintentional.

## 2016-08-22 NOTE — Progress Notes (Signed)
Subjective:   Patient known to our practice from outpatient dialysis. He presented for weakness in his right arm and right leg. He was diagnosed with TIA This morning his symptoms are resolved CT of the head shows age indeterminate small vessel ischemic changes to posterior limb of the left internal capsule and thalamus. Underlying chronic bilateral, corona radiata white matter ischemia Potassium level is low this morning at 2.9 Issue and denies shortness of breath No leg edema  Objective:  Vital signs in last 24 hours:  Temp:  [98 F (36.7 C)-100.1 F (37.8 C)] 98.4 F (36.9 C) (09/02 0600) Pulse Rate:  [58-69] 58 (09/02 0600) Resp:  [16-20] 20 (09/02 0025) BP: (156-191)/(62-78) 160/78 (09/02 0600) SpO2:  [94 %-97 %] 96 % (09/02 0600)  Weight change:  Filed Weights   08/21/16 1046  Weight: 79.4 kg (175 lb)    Intake/Output:    Intake/Output Summary (Last 24 hours) at 08/22/16 1111 Last data filed at 08/22/16 0900  Gross per 24 hour  Intake              840 ml  Output                0 ml  Net              840 ml     Physical Exam: General: No acute distress, lying in the bed   HEENT Anicteric, moist oral mucous membranes   Neck Supple, no masses   Pulm/lungs Normal breathing effort, clear to auscultation bilaterally   CVS/Heart No rub or gallop   Abdomen:  Soft, nontender   Extremities: Right arm in sling, no edema   Neurologic: able to move both legs, speech normal   Skin: No rashes   Access: Forearm AV fistula, good thrill        Basic Metabolic Panel:   Recent Labs Lab 08/21/16 1050 08/22/16 0449  NA 135 138  K 3.1* 2.9*  CL 99* 102  CO2 28 29  GLUCOSE 265* 84  BUN 22* 30*  CREATININE 3.97* 5.17*  CALCIUM 7.9* 7.6*     CBC:  Recent Labs Lab 08/21/16 1050 08/22/16 0449  WBC 7.9 6.5  NEUTROABS 5.6  --   HGB 11.4* 10.9*  HCT 31.7* 31.2*  MCV 90.4 91.6  PLT 182 176      Microbiology:  Recent Results (from the past 720 hour(s))   MRSA PCR Screening     Status: None   Collection Time: 08/21/16  6:22 PM  Result Value Ref Range Status   MRSA by PCR NEGATIVE NEGATIVE Final    Comment:        The GeneXpert MRSA Assay (FDA approved for NASAL specimens only), is one component of a comprehensive MRSA colonization surveillance program. It is not intended to diagnose MRSA infection nor to guide or monitor treatment for MRSA infections.     Coagulation Studies:  Recent Labs  08/21/16 1050  LABPROT 14.0  INR 1.08    Urinalysis: No results for input(s): COLORURINE, LABSPEC, PHURINE, GLUCOSEU, HGBUR, BILIRUBINUR, KETONESUR, PROTEINUR, UROBILINOGEN, NITRITE, LEUKOCYTESUR in the last 72 hours.  Invalid input(s): APPERANCEUR    Imaging: Dg Wrist Complete Right  Result Date: 08/21/2016 CLINICAL DATA:  Fall with right wrist pain. EXAM: RIGHT WRIST - COMPLETE 3+ VIEW COMPARISON:  None. FINDINGS: There is a comminuted intra-articular right distal radius fracture with mild impaction and slight apex dorsal angulation with no significant displacement. There is a minimally displaced ulnar styloid fracture  with 2 mm radial displacement of the fracture fragment. There is prominent soft tissue swelling throughout the right wrist. No dislocation. No suspicious focal osseous lesion. Mild osteoarthritis at the first carpometacarpal joint. Vascular calcifications throughout the soft tissues. IMPRESSION: 1. Comminuted impacted intra-articular right distal radius fracture as described. 2. Minimally displaced right ulnar styloid fracture. Electronically Signed   By: Ilona Sorrel M.D.   On: 08/21/2016 11:59   Ct Head Wo Contrast  Result Date: 08/21/2016 CLINICAL DATA:  55 year old male with right side weakness for less than 3 hours. Code stroke. Initial encounter. EXAM: CT HEAD WITHOUT CONTRAST TECHNIQUE: Contiguous axial images were obtained from the base of the skull through the vertex without intravenous contrast. COMPARISON:  Head CT  without contrast 10/27/2012. FINDINGS: Visualized paranasal sinuses and mastoids are stable and well pneumatized. No acute osseous abnormality identified. Interval postoperative changes to both globes, more so the left. Hyper dense and heterogeneous left globe vitreous likely is postoperative. No acute scalp soft tissue findings. Extensive Calcified atherosclerosis at the skull base. Chronic abnormal hypodensity in the bilateral corona radiata, slightly worse on the right and stable. However, new hypodensity and heterogeneity from the posterior limb of the left internal capsule through to the left thalamus is new since 2013. No superimposed acute cortically based infarct identified. No suspicious intracranial vascular hyperdensity. ASPECTS [facility] Stroke Program Early CT Score) Total score (0-10 with 10 being normal): 10. No acute intracranial hemorrhage identified. No midline shift, mass effect, or evidence of intracranial mass lesion. Stable cerebral volume. IMPRESSION: 1. No acute cortically based infarct or acute intracranial hemorrhage identified. 2. ASPECTS is 10. 3. New since 2013 but age indeterminate small vessel ischemic changes to the posterior limb of the left internal capsule and thalamus. Underlying chronic bilateral corona radiata white matter ischemia. 4. Study discussed by telephone with Dr. Nance Pear on 08/21/2016 at 11:17 . Electronically Signed   By: Genevie Ann M.D.   On: 08/21/2016 11:18   Mr Jodene Nam Head Wo Contrast  Result Date: 08/21/2016 CLINICAL DATA:  55 year old male with acute onset right side weakness. Small vessel ischemia on noncontrast head CT. Initial encounter. EXAM: MRI HEAD WITHOUT CONTRAST MRA HEAD WITHOUT CONTRAST TECHNIQUE: Multiplanar, multiecho pulse sequences of the brain and surrounding structures were obtained without intravenous contrast. Angiographic images of the head were obtained using MRA technique without contrast. COMPARISON:  Head CT without contrast 1107 hours  today. FINDINGS: MRI HEAD FINDINGS Major intracranial vascular flow voids appear preserved. No supratentorial restricted diffusion. There is a 7 mm area of restricted diffusion along the medial aspect of the right cerebellar peduncle, near the dorsal right brainstem. See series 106, image 15. No other posterior fossa restricted diffusion. Associated T2 and FLAIR hyperintensity without associated hemorrhage or mass effect. Chronic lacunar infarcts in the inferior right cerebellum (series 15, image 14), left thalamus, bilateral basal ganglia, bilateral posterior limbs of the internal capsules, and bilateral corona radiata. Small chronic micro hemorrhages in the bilateral basal ganglia. No midline shift, mass effect, evidence of mass lesion, ventriculomegaly, extra-axial collection or acute intracranial hemorrhage. Cervicomedullary junction and pituitary are within normal limits. Negative visualized cervical spine. Visible internal auditory structures appear normal. Trace mastoid fluid. Negative nasopharynx. Trace paranasal sinus mucosal thickening. Postoperative changes to both globes, extensive on the left. No acute scalp soft tissue findings. Normal bone marrow signal. MRA HEAD FINDINGS Antegrade flow in the posterior circulation with codominant distal vertebral arteries. Normal PICA origins. There is mild to moderate irregularity and stenosis  of the distal right vertebral artery near the PICA origin. No distal left vertebral artery stenosis despite mild irregularity. Mild irregularity of the basilar artery without stenosis. AICA, SCA, and PCA origins are normal. Posterior communicating arteries are diminutive or absent. Normal PCA branches. Antegrade flow in both ICA siphons. Moderate cavernous segment irregularity with mild to moderate stenosis on the left. Normal ophthalmic artery origins. No right siphon stenosis. Patent carotid termini. Normal MCA and ACA origins. Anterior communicating artery and visualized  ACA branches are within normal limits. Bilateral MCA M1 segments and bifurcations are normal. Visualized left MCA branches are within normal limits. Visualized right MCA branches are within normal limits. IMPRESSION: 1. Small acute lacunar type infarct of the right cerebellar peduncle. No associated hemorrhage or mass effect. 2. Underlying very age advanced chronic small vessel disease including lacunar infarcts in the right cerebellum, bilateral deep gray matter nuclei, and bilateral white matter capsules. Bilateral basal ganglia chronic microhemorrhage. 3. Intracranial atherosclerosis with mild to moderate stenosis of the distal right vertebral artery and left ICA siphon. Electronically Signed   By: Genevie Ann M.D.   On: 08/21/2016 13:59   Mr Brain Wo Contrast  Result Date: 08/21/2016 CLINICAL DATA:  55 year old male with acute onset right side weakness. Small vessel ischemia on noncontrast head CT. Initial encounter. EXAM: MRI HEAD WITHOUT CONTRAST MRA HEAD WITHOUT CONTRAST TECHNIQUE: Multiplanar, multiecho pulse sequences of the brain and surrounding structures were obtained without intravenous contrast. Angiographic images of the head were obtained using MRA technique without contrast. COMPARISON:  Head CT without contrast 1107 hours today. FINDINGS: MRI HEAD FINDINGS Major intracranial vascular flow voids appear preserved. No supratentorial restricted diffusion. There is a 7 mm area of restricted diffusion along the medial aspect of the right cerebellar peduncle, near the dorsal right brainstem. See series 106, image 15. No other posterior fossa restricted diffusion. Associated T2 and FLAIR hyperintensity without associated hemorrhage or mass effect. Chronic lacunar infarcts in the inferior right cerebellum (series 15, image 14), left thalamus, bilateral basal ganglia, bilateral posterior limbs of the internal capsules, and bilateral corona radiata. Small chronic micro hemorrhages in the bilateral basal  ganglia. No midline shift, mass effect, evidence of mass lesion, ventriculomegaly, extra-axial collection or acute intracranial hemorrhage. Cervicomedullary junction and pituitary are within normal limits. Negative visualized cervical spine. Visible internal auditory structures appear normal. Trace mastoid fluid. Negative nasopharynx. Trace paranasal sinus mucosal thickening. Postoperative changes to both globes, extensive on the left. No acute scalp soft tissue findings. Normal bone marrow signal. MRA HEAD FINDINGS Antegrade flow in the posterior circulation with codominant distal vertebral arteries. Normal PICA origins. There is mild to moderate irregularity and stenosis of the distal right vertebral artery near the PICA origin. No distal left vertebral artery stenosis despite mild irregularity. Mild irregularity of the basilar artery without stenosis. AICA, SCA, and PCA origins are normal. Posterior communicating arteries are diminutive or absent. Normal PCA branches. Antegrade flow in both ICA siphons. Moderate cavernous segment irregularity with mild to moderate stenosis on the left. Normal ophthalmic artery origins. No right siphon stenosis. Patent carotid termini. Normal MCA and ACA origins. Anterior communicating artery and visualized ACA branches are within normal limits. Bilateral MCA M1 segments and bifurcations are normal. Visualized left MCA branches are within normal limits. Visualized right MCA branches are within normal limits. IMPRESSION: 1. Small acute lacunar type infarct of the right cerebellar peduncle. No associated hemorrhage or mass effect. 2. Underlying very age advanced chronic small vessel disease including lacunar infarcts  in the right cerebellum, bilateral deep gray matter nuclei, and bilateral white matter capsules. Bilateral basal ganglia chronic microhemorrhage. 3. Intracranial atherosclerosis with mild to moderate stenosis of the distal right vertebral artery and left ICA siphon.  Electronically Signed   By: Genevie Ann M.D.   On: 08/21/2016 13:59   US Carotid Bilateral  Result Date: 08/22/2016 CLINICAL DATA:  Cerebral infarction. EXAM: BILATERAL CAROTID DUPLEX ULTRASOUND TECHNIQUE: Pearline Cables scale imaging, color Doppler and duplex ultrasound were performed of bilateral carotid and vertebral arteries in the neck. COMPARISON:  None. FINDINGS: Criteria: Quantification of carotid stenosis is based on velocity parameters that correlate the residual internal carotid diameter with NASCET-based stenosis levels, using the diameter of the distal internal carotid lumen as the denominator for stenosis measurement. The following velocity measurements were obtained: RIGHT ICA:  137/28 mid, 107/23 proximal cm/sec CCA:  123456 cm/sec SYSTOLIC ICA/CCA RATIO:  0.9 DIASTOLIC ICA/CCA RATIO:  2.0 ECA:  211 cm/sec LEFT ICA:  182/29 mid, 82/16 proximal cm/sec CCA:  [id number] cm/sec SYSTOLIC ICA/CCA RATIO:  1.3 DIASTOLIC ICA/CCA RATIO:  1.5 ECA:  322 cm/sec RIGHT CAROTID ARTERY: Mild to moderate amount of calcified plaque is identified in the distal common carotid artery, carotid bulb and at the right ICA origin. Based on velocities, estimated proximal right ICA stenosis is less than 50%. The elevated mid right ICA velocity appears to relate to tortuosity and turbulence rather than atherosclerotic stenosis. RIGHT VERTEBRAL ARTERY: Antegrade flow with normal waveform and velocity. LEFT CAROTID ARTERY: There is a mild to moderate amount of calcified plaque at the level of the left carotid bulb and extending into the proximal left ICA. Estimated left ICA stenosis is less than 50%. Focal elevated velocities in the mid ICA are secondary to tortuosity. LEFT VERTEBRAL ARTERY: Antegrade flow with normal waveform and velocity. IMPRESSION: Mild to moderate amount of calcified plaque bilaterally at the level of the carotid bulbs and proximal internal carotid arteries. Proximal ICA stenoses are estimated to be less than 50%  bilaterally. Electronically Signed   By: Aletta Edouard M.D.   On: 08/22/2016 09:44     Medications:     . amLODipine  10 mg Oral QHS  . aspirin EC  81 mg Oral Daily  . atorvastatin  40 mg Oral QHS  . carvedilol  37.5 mg Oral BID  . clopidogrel  75 mg Oral Daily  . furosemide  40 mg Oral BID  . heparin  5,000 Units Subcutaneous Q8H  . hydrALAZINE  75 mg Oral TID  . insulin aspart  0-5 Units Subcutaneous QHS  . insulin aspart  0-9 Units Subcutaneous TID WC  . insulin glargine  5 Units Subcutaneous Daily  . isosorbide mononitrate  30 mg Oral QHS  . sodium chloride flush  3 mL Intravenous Q12H   acetaminophen **OR** acetaminophen, hydrALAZINE, ondansetron **OR** ondansetron (ZOFRAN) IV  Assessment/ Plan:  55 y.o. Caucasian male with end-stage renal disease, insulin-dependent diabetes mellitus, hypertension, diastolic CHF, history of Wilms tumor status post left nephrectomy, history of stroke, presents with right leg and arm weakness  1. End-stage renal disease. [facility]/ MWF-1 Currently there is no acute indication for acute dialysis We will continue to monitor We will dialyze him if still in the hospital on Monday otherwise he will go for dialysis as outpatient  2. Anemia of chronic kidney disease Current hemoglobin 10.9, acceptable  3. Hypokalemia Consider adding spironolactone 25 mg by mouth daily      LOS: 0 Anwen Cannedy 9/2/201711:11 AM

## 2016-08-22 NOTE — Care Management Note (Signed)
Case Management Note  Patient Details  Name: Richard Zavala MRN: [id number] Date of Birth: Oct 20, 1961  Subjective/Objective:   Provided uninsured [name] with discount coupons for Plavix and Lipitor, and he can pick up spironolactone at [facility] for $4.00. Provided him with information to contact the [facility] if he wants free outpatient PT services.  [name] was encouraged to contact his PCP's office (Dr Ricard Dillon) if any further home medical services are identified after he returns home.                 Action/Plan:   Expected Discharge Date:                  Expected Discharge Plan:     In-House Referral:     Discharge planning Services     Post Acute Care Choice:    Choice offered to:     DME Arranged:    DME Agency:     HH Arranged:    HH Agency:     Status of Service:     If discussed at [employer] of Stay Meetings, dates discussed:    Additional Comments:  Katesha Eichel A, RN 08/22/2016, 12:46 PM

## 2016-08-22 NOTE — Progress Notes (Signed)
Patient is alert and oriented x 4, denies pain, US carotids performed and unremarkable- no intervention needed, NIH-1, cleared by speech, tolerating diet, on room air, patient is d/c to home , case manager spoke with patient about vouchers to assist in paying for medications and resources for outpatient physical therapy, potassium low- one time dose potassium given orally, prescriptions provided to patient, ortho evaluated patient and stabilized arm- patient instructed to follow up with ortho in 10 days, address and phone number of Dr. Prudy Feeler provided to patient to make an appt when office is open. Patient verbalizes understanding of d/c instructions and verbalizes that he already has dialysis scheduled set. Pt transported to home via son.

## 2016-08-22 NOTE — Assessment & Plan Note (Signed)
Acceptable alignment; patient agreeable to nonoperative management in splint with conversion to cast at 10-14 days.

## 2016-08-22 NOTE — Consult Note (Signed)
ORTHOPAEDIC CONSULTATION  PATIENT NAME: Richard Zavala DOB: May 17, 1961  MRN: [id number]  REQUESTING PHYSICIAN: Demetrios Loll, MD  Chief Complaint: Right wrist pain  HPI: Richard Zavala is a 55 y.o. male with history of diabetes mellitus (hgb A1c 5.3), ESRD on MWF Hd, CAD, stroke (2013), and osteoporosis admitted for an episode of right leg weakness and vertigo x1 week  who fell on 08/17/16 and injured his right wrist. He subsequently had another fall on 08/21/16 after which he presented for evaluation. Today he complains of right wrist pain and swelling. Denies any numbness or tingling. Reports his wrist feels much better today than when the injury first occurred.  Past Medical History:  Diagnosis Date  . Anemia   . Blind   . BPH (benign prostatic hyperplasia)   . CAD (coronary artery disease)   . CHF (congestive heart failure) ([place])   . Chronic kidney disease (CKD), stage IV (severe) ([facility])    DIALYSIS  . Diabetes mellitus without complication ([place])   . ESRD (end stage renal disease) ([place])    Monday-Wednesday-Friday dialysis  . Hyperlipemia   . Hypertension   . Neuropathy ([place])   . Osteoporosis   . Pulmonary HTN ([place])   . Stroke [facility])    2013  . TIA (transient ischemic attack)   . TRD (traction retinal detachment)   . Wilms' tumor [facility])    Past Surgical History:  Procedure Laterality Date  . AV FISTULA PLACEMENT    . CARDIAC CATHETERIZATION    . CATARACT EXTRACTION W/PHACO Right 05/14/2016   Procedure: CATARACT EXTRACTION PHACO AND INTRAOCULAR LENS PLACEMENT (IOC);  Surgeon: Eulogio Bear, MD;  Location: [facility] ORS;  Service: Ophthalmology;  Laterality: Right;  [place] 1.46 AP% 11.5 CDE 12.33 FLUID PACK LOT # [id number]  . EYE SURGERY    . FRACTURE SURGERY     RIGHT LEG WITH ROD  . HIP SURGERY    . NEPHRECTOMY RADICAL    . PERIPHERAL VASCULAR CATHETERIZATION N/A 08/28/2015   Procedure: A/V Shuntogram/Fistulagram;  Surgeon: Algernon Huxley, MD;  Location: [place] [facility];   Service: Cardiovascular;  Laterality: N/A;  . PERIPHERAL VASCULAR CATHETERIZATION Left 08/28/2015   Procedure: A/V Shunt Intervention;  Surgeon: Algernon Huxley, MD;  Location: [place] [facility];  Service: Cardiovascular;  Laterality: Left;   Social History   Social History  . Marital status: Married    Spouse name: N/A  . Number of children: N/A  . Years of education: N/A   Social History Main Topics  . Smoking status: Current Every Day Smoker    Packs/day: 1.00    Types: Cigarettes    Last attempt to quit: 06/26/2012  . Smokeless tobacco: Never Used  . Alcohol use No  . Drug use: No  . Sexual activity: Not Asked   Other Topics Concern  . None   Social History Narrative   Lives at home with his children. Ambulates well at baseline.   Family History  Problem Relation Age of Onset  . CAD Father   . COPD Father   . CAD Paternal Grandfather   . Diabetes Maternal Aunt   . Diabetes Maternal Uncle    Allergies  Allergen Reactions  . Metformin Other (See Comments)    Severe diarrhea  . Penicillins Hives   Prior to Admission medications   Medication Sig Start Date End Date Taking? Authorizing Provider  amLODipine (NORVASC) 10 MG tablet Take 1 tablet (10 mg total) by mouth at bedtime. 08/17/15  Yes Epifanio Lesches, MD  aspirin EC 81 MG tablet Take 81 mg by mouth daily.   Yes Historical Provider, MD  atorvastatin (LIPITOR) 40 MG tablet Take 40 mg by mouth at bedtime.   Yes Historical Provider, MD  carvedilol (COREG) 12.5 MG tablet Take 37.5 mg by mouth 2 (two) times daily.   Yes Historical Provider, MD  furosemide (LASIX) 40 MG tablet Take 40 mg by mouth 2 (two) times daily.    Yes Historical Provider, MD  hydrALAZINE (APRESOLINE) 50 MG tablet Take 75 mg by mouth 3 (three) times daily.    Yes Historical Provider, MD  insulin glargine (LANTUS) 100 UNIT/ML injection Inject 5 Units into the skin daily.    Yes Historical Provider, MD  isosorbide mononitrate (IMDUR) 30 MG 24 hr  tablet Take 1 tablet (30 mg total) by mouth daily. Patient taking differently: Take 30 mg by mouth at bedtime.  08/17/15  Yes Epifanio Lesches, MD   Dg Wrist Complete Right  Result Date: 08/21/2016 CLINICAL DATA:  Fall with right wrist pain. EXAM: RIGHT WRIST - COMPLETE 3+ VIEW COMPARISON:  None. FINDINGS: There is a comminuted intra-articular right distal radius fracture with mild impaction and slight apex dorsal angulation with no significant displacement. There is a minimally displaced ulnar styloid fracture with 2 mm radial displacement of the fracture fragment. There is prominent soft tissue swelling throughout the right wrist. No dislocation. No suspicious focal osseous lesion. Mild osteoarthritis at the first carpometacarpal joint. Vascular calcifications throughout the soft tissues. IMPRESSION: 1. Comminuted impacted intra-articular right distal radius fracture as described. 2. Minimally displaced right ulnar styloid fracture. Electronically Signed   By: Ilona Sorrel M.D.   On: 08/21/2016 11:59   Positive ROS: All other systems have been reviewed and were otherwise negative with the exception of those mentioned in the HPI and as above.  Physical Exam: General: Alert and alert in no acute distress. HEENT: Atraumatic and normocephalic. Sclera are clear. Extraocular motion is intact. Oropharynx is clear with moist mucosa. Neck: Supple, nontender, good range of motion. No JVD or carotid bruits. Lungs: Clear to auscultation bilaterally. Cardiovascular: Regular rate and rhythm. Pedal pulses are palpable bilaterally. Homans test is negative bilaterally. No significant pretibial or ankle edema. Abdomen: Soft, nontender, and nondistended. Bowel sounds are present. Skin: No lesions in the area of chief complaint. Mild ecchymosis over volar forearm. Neurologic: Awake, alert, and oriented. Sensory function is grossly intact. Motor strength is felt to be 5 over 5 bilaterally. No clonus or tremor. Good  motor coordination. Lymphatic: No axillary or cervical lymphadenopathy  MUSCULOSKELETAL:  Right wrist tender to palpation with moderate swelling diffusely about the wrist and distal radius. Tender over ulnar styloid. Intact wrist and digit flexion and extension though somewhat limited at end-range of motion by discomfort. Intact pronosupination of forearm, with discomfort. Sensation intact to light touch over thumb, index, small fingers both volarly and distally. Fingers warm and well perfused with cap refill <2s over all fingers.  Imaging: Right wrist films personally reviewed, demonstrating a minimally impacted, volarly displaced distal radius fracture with bony avulsion off the ulnar aspect of distal radius and ulnar styloid minimally displaced fracture. Alignment of distal radius fracture with 13 degrees of volar tilt. Prominent vascular calcifications.  Assessment: 55 y.o. male with multiple medical problems and distal radius fracture with ulnar styloid avulsion in acceptable alignment and largely retained range of motion.  Plan: Discussed risks and benefits of operative versus nonoperative management with the patient and the patient  elected to proceed with nonoperative management.  - Patient placed in short-arm fiberglass splint  -Keep splint clean dry and intact. OK to loosen ACE wrap if discomfort.  -No weight-bearing through right wrist (OK to bear weight through elbow with platform walker if needed given recent falls)  -Should follow-up in 7-10 days with orthopaedic surgeon of his choice for new xrays right wrist to ensure no loss of alignment and conversion to cast at that point.   Wynn Banker M.D.  08/22/2016 8:52 AM

## 2016-08-22 NOTE — Consult Note (Signed)
Reason for Consult: stroke  Referring Physician: Dr Bridgett Larsson  CC: stroke  HPI: Richard Zavala is an 55 y.o. male male with a known history of Insulin-dependent diabetes mellitus, end-stage renal disease on Monday Wednesday Friday hemodialysis, hypertension, diastolic CHF, history of Wilms tumor status post left nephrectomy presents to the hospital secondary to right leg weakness that started yesterday  Pt is complaint with his dialysis and only missed once last Monday.   R leg weakness resolved and he is back to baseline.  MRI R cerebellar peduncle stroke with tight R vert.   Past Medical History:  Diagnosis Date  . Anemia   . Blind   . BPH (benign prostatic hyperplasia)   . CAD (coronary artery disease)   . CHF (congestive heart failure) ([place])   . Chronic kidney disease (CKD), stage IV (severe) (HCC)    DIALYSIS  . Diabetes mellitus without complication ([place])   . ESRD (end stage renal disease) ([place])    Monday-Wednesday-Friday dialysis  . Hyperlipemia   . Hypertension   . Neuropathy ([place])   . Osteoporosis   . Pulmonary HTN ([place])   . Stroke [facility])    2013  . TIA (transient ischemic attack)   . TRD (traction retinal detachment)   . Wilms' tumor [facility])     Past Surgical History:  Procedure Laterality Date  . AV FISTULA PLACEMENT    . CARDIAC CATHETERIZATION    . CATARACT EXTRACTION W/PHACO Right 05/14/2016   Procedure: CATARACT EXTRACTION PHACO AND INTRAOCULAR LENS PLACEMENT (IOC);  Surgeon: Eulogio Bear, MD;  Location: [facility] ORS;  Service: Ophthalmology;  Laterality: Right;  [place] 1.46 AP% 11.5 CDE 12.33 FLUID PACK LOT # [id number]  . EYE SURGERY    . FRACTURE SURGERY     RIGHT LEG WITH ROD  . HIP SURGERY    . NEPHRECTOMY RADICAL    . PERIPHERAL VASCULAR CATHETERIZATION N/A 08/28/2015   Procedure: A/V Shuntogram/Fistulagram;  Surgeon: Algernon Huxley, MD;  Location: [place] CV LAB;  Service: Cardiovascular;  Laterality: N/A;  . PERIPHERAL VASCULAR CATHETERIZATION Left  08/28/2015   Procedure: A/V Shunt Intervention;  Surgeon: Algernon Huxley, MD;  Location: [place] [facility] LAB;  Service: Cardiovascular;  Laterality: Left;    Family History  Problem Relation Age of Onset  . CAD Father   . COPD Father   . CAD Paternal Grandfather   . Diabetes Maternal Aunt   . Diabetes Maternal Uncle     Social History:  reports that he has been smoking Cigarettes.  He has been smoking about 1.00 pack per day. He has never used smokeless tobacco. He reports that he does not drink alcohol or use drugs.  Allergies  Allergen Reactions  . Metformin Other (See Comments)    Severe diarrhea  . Penicillins Hives    Medications: I have reviewed the patient's current medications.  ROS: History obtained from the patient  General ROS: negative for - chills, fatigue, fever, night sweats, weight gain or weight loss Psychological ROS: negative for - behavioral disorder, hallucinations, memory difficulties, mood swings or suicidal ideation Ophthalmic ROS: negative for - blurry vision, double vision, eye pain or loss of vision ENT ROS: negative for - epistaxis, nasal discharge, oral lesions, sore throat, tinnitus or vertigo Allergy and Immunology ROS: negative for - hives or itchy/watery eyes Hematological and Lymphatic ROS: negative for - bleeding problems, bruising or swollen lymph nodes Endocrine ROS: positive for DM and ESRD.  Respiratory ROS: negative for - cough, hemoptysis,  shortness of breath or wheezing Cardiovascular ROS: negative for - chest pain, dyspnea on exertion, edema or irregular heartbeat Gastrointestinal ROS: negative for - abdominal pain, diarrhea, hematemesis, nausea/vomiting or stool incontinence Genito-Urinary ROS: negative for - dysuria, hematuria, incontinence or urinary frequency/urgency Musculoskeletal ROS: negative for - joint swelling or muscular weakness Neurological ROS: as noted in HPI Dermatological ROS: negative for rash and skin lesion  changes  Physical Examination: Blood pressure (!) 160/78, pulse (!) 58, temperature 98.4 F (36.9 C), temperature source Oral, resp. rate 20, height 5\' 8"  (1.727 m), weight 79.4 kg (175 lb), SpO2 96 %.   Neurological Examination Mental Status: Alert, oriented, thought content appropriate.  Speech fluent without evidence of aphasia.  Able to follow 3 step commands without difficulty. Cranial Nerves: II: L eye blindness III,IV, VI: ptosis not present, extra-ocular motions intact bilaterally V,VII: smile symmetric, facial light touch sensation normal bilaterally VIII: hearing normal bilaterally IX,X: gag reflex present XI: bilateral shoulder shrug XII: midline tongue extension Motor: Right : Upper extremity   5/5    Left:     Upper extremity   5/5  Lower extremity   5/5     Lower extremity   5/5 Tone and bulk:normal tone throughout; no atrophy noted Sensory: Pinprick and light touch intact throughout, bilaterally Deep Tendon Reflexes: 1+ and symmetric throughout Plantars: Right: downgoing   Left: downgoing Cerebellar: normal finger-to-nose, normal rapid alternating movements and normal heel-to-shin test Gait: not tested       Laboratory Studies:   Basic Metabolic Panel:  Recent Labs Lab 08/21/16 1050 08/22/16 0449  NA 135 138  K 3.1* 2.9*  CL 99* 102  CO2 28 29  GLUCOSE 265* 84  BUN 22* 30*  CREATININE 3.97* 5.17*  CALCIUM 7.9* 7.6*    Liver Function Tests:  Recent Labs Lab 08/21/16 1050  AST 20  ALT 15*  ALKPHOS 125  BILITOT 0.9  PROT 6.5  ALBUMIN 3.1*   No results for input(s): LIPASE, AMYLASE in the last 168 hours. No results for input(s): AMMONIA in the last 168 hours.  CBC:  Recent Labs Lab 08/21/16 1050 08/22/16 0449  WBC 7.9 6.5  NEUTROABS 5.6  --   HGB 11.4* 10.9*  HCT 31.7* 31.2*  MCV 90.4 91.6  PLT 182 176    Cardiac Enzymes:  Recent Labs Lab 08/21/16 1050  TROPONINI 0.03*    BNP: Invalid input(s): POCBNP  CBG:  Recent  Labs Lab 08/21/16 1627 08/21/16 1825 08/21/16 2114 08/22/16 0749 08/22/16 1139  GLUCAP 116* 157* 173* 37 122*    Microbiology: Results for orders placed or performed during the hospital encounter of 08/21/16  MRSA PCR Screening     Status: None   Collection Time: 08/21/16  6:22 PM  Result Value Ref Range Status   MRSA by PCR NEGATIVE NEGATIVE Final    Comment:        The GeneXpert MRSA Assay (FDA approved for NASAL specimens only), is one component of a comprehensive MRSA colonization surveillance program. It is not intended to diagnose MRSA infection nor to guide or monitor treatment for MRSA infections.     Coagulation Studies:  Recent Labs  08/21/16 1050  LABPROT 14.0  INR 1.08    Urinalysis: No results for input(s): COLORURINE, LABSPEC, PHURINE, GLUCOSEU, HGBUR, BILIRUBINUR, KETONESUR, PROTEINUR, UROBILINOGEN, NITRITE, LEUKOCYTESUR in the last 168 hours.  Invalid input(s): APPERANCEUR  Lipid Panel:     Component Value Date/Time   CHOL 115 08/21/2016 1050   TRIG 102 08/21/2016  1050   HDL 41 08/21/2016 1050   CHOLHDL 2.8 08/21/2016 1050   VLDL 20 08/21/2016 1050   LDLCALC 54 08/21/2016 1050    HgbA1C:  Lab Results  Component Value Date   HGBA1C 5.3 08/21/2016    Urine Drug Screen:     Component Value Date/Time   LABOPIA NONE DETECTED 08/14/2015 1945   COCAINSCRNUR NONE DETECTED 08/14/2015 1945   LABBENZ NONE DETECTED 08/14/2015 1945   AMPHETMU NONE DETECTED 08/14/2015 1945   THCU NONE DETECTED 08/14/2015 1945   LABBARB NONE DETECTED 08/14/2015 1945    Alcohol Level: No results for input(s): ETH in the last 168 hours.  Other results: EKG: normal EKG, normal sinus rhythm, unchanged from previous tracings.  Imaging: Dg Wrist Complete Right  Result Date: 08/21/2016 CLINICAL DATA:  Fall with right wrist pain. EXAM: RIGHT WRIST - COMPLETE 3+ VIEW COMPARISON:  None. FINDINGS: There is a comminuted intra-articular right distal radius fracture with  mild impaction and slight apex dorsal angulation with no significant displacement. There is a minimally displaced ulnar styloid fracture with 2 mm radial displacement of the fracture fragment. There is prominent soft tissue swelling throughout the right wrist. No dislocation. No suspicious focal osseous lesion. Mild osteoarthritis at the first carpometacarpal joint. Vascular calcifications throughout the soft tissues. IMPRESSION: 1. Comminuted impacted intra-articular right distal radius fracture as described. 2. Minimally displaced right ulnar styloid fracture. Electronically Signed   By: Ilona Sorrel M.D.   On: 08/21/2016 11:59   Ct Head Wo Contrast  Result Date: 08/21/2016 CLINICAL DATA:  55 year old male with right side weakness for less than 3 hours. Code stroke. Initial encounter. EXAM: CT HEAD WITHOUT CONTRAST TECHNIQUE: Contiguous axial images were obtained from the base of the skull through the vertex without intravenous contrast. COMPARISON:  Head CT without contrast 10/27/2012. FINDINGS: Visualized paranasal sinuses and mastoids are stable and well pneumatized. No acute osseous abnormality identified. Interval postoperative changes to both globes, more so the left. Hyper dense and heterogeneous left globe vitreous likely is postoperative. No acute scalp soft tissue findings. Extensive Calcified atherosclerosis at the skull base. Chronic abnormal hypodensity in the bilateral corona radiata, slightly worse on the right and stable. However, new hypodensity and heterogeneity from the posterior limb of the left internal capsule through to the left thalamus is new since 2013. No superimposed acute cortically based infarct identified. No suspicious intracranial vascular hyperdensity. ASPECTS [facility] Stroke Program Early CT Score) Total score (0-10 with 10 being normal): 10. No acute intracranial hemorrhage identified. No midline shift, mass effect, or evidence of intracranial mass lesion. Stable cerebral  volume. IMPRESSION: 1. No acute cortically based infarct or acute intracranial hemorrhage identified. 2. ASPECTS is 10. 3. New since 2013 but age indeterminate small vessel ischemic changes to the posterior limb of the left internal capsule and thalamus. Underlying chronic bilateral corona radiata white matter ischemia. 4. Study discussed by telephone with Dr. Nance Pear on 08/21/2016 at 11:17 . Electronically Signed   By: Genevie Ann M.D.   On: 08/21/2016 11:18   Mr Jodene Nam Head Wo Contrast  Result Date: 08/21/2016 CLINICAL DATA:  55 year old male with acute onset right side weakness. Small vessel ischemia on noncontrast head CT. Initial encounter. EXAM: MRI HEAD WITHOUT CONTRAST MRA HEAD WITHOUT CONTRAST TECHNIQUE: Multiplanar, multiecho pulse sequences of the brain and surrounding structures were obtained without intravenous contrast. Angiographic images of the head were obtained using MRA technique without contrast. COMPARISON:  Head CT without contrast 1107 hours today. FINDINGS: MRI HEAD  FINDINGS Major intracranial vascular flow voids appear preserved. No supratentorial restricted diffusion. There is a 7 mm area of restricted diffusion along the medial aspect of the right cerebellar peduncle, near the dorsal right brainstem. See series 106, image 15. No other posterior fossa restricted diffusion. Associated T2 and FLAIR hyperintensity without associated hemorrhage or mass effect. Chronic lacunar infarcts in the inferior right cerebellum (series 15, image 14), left thalamus, bilateral basal ganglia, bilateral posterior limbs of the internal capsules, and bilateral corona radiata. Small chronic micro hemorrhages in the bilateral basal ganglia. No midline shift, mass effect, evidence of mass lesion, ventriculomegaly, extra-axial collection or acute intracranial hemorrhage. Cervicomedullary junction and pituitary are within normal limits. Negative visualized cervical spine. Visible internal auditory structures  appear normal. Trace mastoid fluid. Negative nasopharynx. Trace paranasal sinus mucosal thickening. Postoperative changes to both globes, extensive on the left. No acute scalp soft tissue findings. Normal bone marrow signal. MRA HEAD FINDINGS Antegrade flow in the posterior circulation with codominant distal vertebral arteries. Normal PICA origins. There is mild to moderate irregularity and stenosis of the distal right vertebral artery near the PICA origin. No distal left vertebral artery stenosis despite mild irregularity. Mild irregularity of the basilar artery without stenosis. AICA, SCA, and PCA origins are normal. Posterior communicating arteries are diminutive or absent. Normal PCA branches. Antegrade flow in both ICA siphons. Moderate cavernous segment irregularity with mild to moderate stenosis on the left. Normal ophthalmic artery origins. No right siphon stenosis. Patent carotid termini. Normal MCA and ACA origins. Anterior communicating artery and visualized ACA branches are within normal limits. Bilateral MCA M1 segments and bifurcations are normal. Visualized left MCA branches are within normal limits. Visualized right MCA branches are within normal limits. IMPRESSION: 1. Small acute lacunar type infarct of the right cerebellar peduncle. No associated hemorrhage or mass effect. 2. Underlying very age advanced chronic small vessel disease including lacunar infarcts in the right cerebellum, bilateral deep gray matter nuclei, and bilateral white matter capsules. Bilateral basal ganglia chronic microhemorrhage. 3. Intracranial atherosclerosis with mild to moderate stenosis of the distal right vertebral artery and left ICA siphon. Electronically Signed   By: Genevie Ann M.D.   On: 08/21/2016 13:59   Mr Brain Wo Contrast  Result Date: 08/21/2016 CLINICAL DATA:  55 year old male with acute onset right side weakness. Small vessel ischemia on noncontrast head CT. Initial encounter. EXAM: MRI HEAD WITHOUT CONTRAST  MRA HEAD WITHOUT CONTRAST TECHNIQUE: Multiplanar, multiecho pulse sequences of the brain and surrounding structures were obtained without intravenous contrast. Angiographic images of the head were obtained using MRA technique without contrast. COMPARISON:  Head CT without contrast 1107 hours today. FINDINGS: MRI HEAD FINDINGS Major intracranial vascular flow voids appear preserved. No supratentorial restricted diffusion. There is a 7 mm area of restricted diffusion along the medial aspect of the right cerebellar peduncle, near the dorsal right brainstem. See series 106, image 15. No other posterior fossa restricted diffusion. Associated T2 and FLAIR hyperintensity without associated hemorrhage or mass effect. Chronic lacunar infarcts in the inferior right cerebellum (series 15, image 14), left thalamus, bilateral basal ganglia, bilateral posterior limbs of the internal capsules, and bilateral corona radiata. Small chronic micro hemorrhages in the bilateral basal ganglia. No midline shift, mass effect, evidence of mass lesion, ventriculomegaly, extra-axial collection or acute intracranial hemorrhage. Cervicomedullary junction and pituitary are within normal limits. Negative visualized cervical spine. Visible internal auditory structures appear normal. Trace mastoid fluid. Negative nasopharynx. Trace paranasal sinus mucosal thickening. Postoperative changes to both globes,  extensive on the left. No acute scalp soft tissue findings. Normal bone marrow signal. MRA HEAD FINDINGS Antegrade flow in the posterior circulation with codominant distal vertebral arteries. Normal PICA origins. There is mild to moderate irregularity and stenosis of the distal right vertebral artery near the PICA origin. No distal left vertebral artery stenosis despite mild irregularity. Mild irregularity of the basilar artery without stenosis. AICA, SCA, and PCA origins are normal. Posterior communicating arteries are diminutive or absent. Normal  PCA branches. Antegrade flow in both ICA siphons. Moderate cavernous segment irregularity with mild to moderate stenosis on the left. Normal ophthalmic artery origins. No right siphon stenosis. Patent carotid termini. Normal MCA and ACA origins. Anterior communicating artery and visualized ACA branches are within normal limits. Bilateral MCA M1 segments and bifurcations are normal. Visualized left MCA branches are within normal limits. Visualized right MCA branches are within normal limits. IMPRESSION: 1. Small acute lacunar type infarct of the right cerebellar peduncle. No associated hemorrhage or mass effect. 2. Underlying very age advanced chronic small vessel disease including lacunar infarcts in the right cerebellum, bilateral deep gray matter nuclei, and bilateral white matter capsules. Bilateral basal ganglia chronic microhemorrhage. 3. Intracranial atherosclerosis with mild to moderate stenosis of the distal right vertebral artery and left ICA siphon. Electronically Signed   By: Genevie Ann M.D.   On: 08/21/2016 13:59   US Carotid Bilateral  Result Date: 08/22/2016 CLINICAL DATA:  Cerebral infarction. EXAM: BILATERAL CAROTID DUPLEX ULTRASOUND TECHNIQUE: Pearline Cables scale imaging, color Doppler and duplex ultrasound were performed of bilateral carotid and vertebral arteries in the neck. COMPARISON:  None. FINDINGS: Criteria: Quantification of carotid stenosis is based on velocity parameters that correlate the residual internal carotid diameter with NASCET-based stenosis levels, using the diameter of the distal internal carotid lumen as the denominator for stenosis measurement. The following velocity measurements were obtained: RIGHT ICA:  137/28 mid, 107/23 proximal cm/sec CCA:  123456 cm/sec SYSTOLIC ICA/CCA RATIO:  0.9 DIASTOLIC ICA/CCA RATIO:  2.0 ECA:  211 cm/sec LEFT ICA:  182/29 mid, 82/16 proximal cm/sec CCA:  [id number] cm/sec SYSTOLIC ICA/CCA RATIO:  1.3 DIASTOLIC ICA/CCA RATIO:  1.5 ECA:  322 cm/sec RIGHT  CAROTID ARTERY: Mild to moderate amount of calcified plaque is identified in the distal common carotid artery, carotid bulb and at the right ICA origin. Based on velocities, estimated proximal right ICA stenosis is less than 50%. The elevated mid right ICA velocity appears to relate to tortuosity and turbulence rather than atherosclerotic stenosis. RIGHT VERTEBRAL ARTERY: Antegrade flow with normal waveform and velocity. LEFT CAROTID ARTERY: There is a mild to moderate amount of calcified plaque at the level of the left carotid bulb and extending into the proximal left ICA. Estimated left ICA stenosis is less than 50%. Focal elevated velocities in the mid ICA are secondary to tortuosity. LEFT VERTEBRAL ARTERY: Antegrade flow with normal waveform and velocity. IMPRESSION: Mild to moderate amount of calcified plaque bilaterally at the level of the carotid bulbs and proximal internal carotid arteries. Proximal ICA stenoses are estimated to be less than 50% bilaterally. Electronically Signed   By: Aletta Edouard M.D.   On: 08/22/2016 09:44     Assessment/Plan:  55 y.o. male male with a known history of Insulin-dependent diabetes mellitus, end-stage renal disease on Monday Wednesday Friday hemodialysis, hypertension, diastolic CHF, history of Wilms tumor status post left nephrectomy presents to the hospital secondary to right leg weakness that started yesterday  Pt is complaint with his dialysis and only missed once  last Monday.   R leg weakness resolved and he is back to baseline.  MRI R cerebellar peduncle stroke with tight R vert.   Agree with dual anti platelet in setting of stroke and R vertebral artery stenosis D/c planning today Follow up neurology as out pt. [name]  08/22/2016, 2:11 PM

## 2016-08-22 NOTE — Discharge Instructions (Signed)
Heart healthy and ADA diet. °Activity as tolerated. °Fall precaution. °

## 2016-08-22 NOTE — Evaluation (Signed)
Occupational Therapy Evaluation Patient Details Name: Richard Zavala MRN: [id number] DOB: 1961/10/09 Today's Date: 08/22/2016    History of Present Illness Pt. is a 55 y.o. male who was admitted with a TIA. Pt. has a Right Distal Radius Fracture, and an Ulnar Styloid Avulsion.   Clinical Impression   Pt. Is a 55 y.o. Male who was admitted with a TIA. Pt. Also has a right Distal Radius Fracture, and an ulnar styloid Avulsion. Pt.'s Right UE is immobilized which hinders his ability to complete basic ADL and IADL functioning. Pt. Could benefit from skilled OT services for ADL training, A/E training, and pt. Education about work simplification techniques, and home modifications. Pt. Plans to return home with family assist.    Follow Up Recommendations  Home health OT    Equipment Recommendations       Recommendations for Other Services PT consult     Precautions / Restrictions Restrictions Weight Bearing Restrictions: yes NWB through RUE         Balance                                            ADL Overall ADL's : Needs assistance/impaired Eating/Feeding: Minimal assistance;Set up (Pt. is unable to cut food/meat.)   Grooming: Minimal assistance;Set up           Upper Body Dressing : Minimal assistance   Lower Body Dressing: Minimal assistance                 General ADL Comments: Eval was performed at bedside. Modified secondary to low potassium levels.     Vision     Perception     Praxis      Pertinent Vitals/Pain Pain Assessment: 0-10 Pain Score: 0-No pain     Hand Dominance Right   Extremity/Trunk Assessment Upper Extremity Assessment Upper Extremity Assessment: RUE deficits/detail (RUE cast in place secondary to a Distal Radius Fracture, and Ulnar Styloid Avulsion.) RUE Deficits / Details: NWB, cast in place RUE: Unable to fully assess due to immobilization           Communication Communication Communication: No  difficulties   Cognition Arousal/Alertness: Awake/alert Behavior During Therapy: WFL for tasks assessed/performed Overall Cognitive Status: Within Functional Limits for tasks assessed                     General Comments       Exercises       Shoulder Instructions      Home Living Family/patient expects to be discharged to:: Private residence Living Arrangements: Children Available Help at Discharge: Family Type of Home: House Home Access: Stairs to enter Technical brewer of Steps: 1   Home Layout: One level     Bathroom Shower/Tub: Tub/shower unit Shower/tub characteristics: Door Biochemist, clinical: Standard Bathroom Accessibility: Yes How Accessible: Accessible via walker Home Equipment: Walker - 2 wheels;Cane - single point          Prior Functioning/Environment Level of Independence: Independent        Comments: Pt. used a walker, and was not driving.    OT Diagnosis: Generalized weakness;Acute pain   OT Problem List: Decreased strength;Decreased coordination;Decreased activity tolerance;Decreased safety awareness;Impaired UE functional use;Decreased knowledge of use of DME or AE   OT Treatment/Interventions: Self-care/ADL training;Therapeutic exercise;Therapeutic activities;Visual/perceptual remediation/compensation;DME and/or AE instruction;Patient/family education    OT Goals(Current goals can  be found in the care plan section) Acute Rehab OT Goals Patient Stated Goal: To return home. OT Goal Formulation: With patient Potential to Achieve Goals: Good  OT Frequency: Min 2X/week   Barriers to D/C:            Co-evaluation              End of Session    Activity Tolerance: Patient tolerated treatment well Patient left: in bed;with call bell/phone within reach;with bed alarm set   Time: 1015-1030 OT Time Calculation (min): 15 min Charges:  OT General Charges $OT Visit: 1 Procedure OT Evaluation $OT Eval Moderate Complexity:  1 Procedure G-Codes: OT G-codes **NOT FOR INPATIENT CLASS** Functional Assessment Tool Used: Clinical judgement based on pt. functional status. Functional Limitation: Self care Self Care Current Status [phone]): At least 20 percent but less than 40 percent impaired, limited or restricted Self Care Goal Status [id number]): At least 1 percent but less than 20 percent impaired, limited or restricted  Harrel Carina, MS, OTR/L 08/22/2016, 10:47 AM

## 2016-08-22 NOTE — Progress Notes (Signed)
Speech Therapy Note: received order, reviewed chart notes. Consulted NSG then pt. Pt denied any difficulty swallowing and is currently on a regular diet; noted he had eaten his entire breakfast meal/tray. Pt tolerates swallowing pills w/ water per NSG. Pt conversed at conversational level w/out deficits noted; pt denied any speech-language deficits.  No further skilled ST services indicated as pt appears at his baseline. Pt agreed. NSG to reconsult if any change in status.

## 2016-12-21 DIAGNOSIS — D631 Anemia in chronic kidney disease: Secondary | ICD-10-CM | POA: Diagnosis not present

## 2016-12-21 DIAGNOSIS — N186 End stage renal disease: Secondary | ICD-10-CM | POA: Diagnosis not present

## 2016-12-21 DIAGNOSIS — D509 Iron deficiency anemia, unspecified: Secondary | ICD-10-CM | POA: Diagnosis not present

## 2016-12-21 DIAGNOSIS — Z992 Dependence on renal dialysis: Secondary | ICD-10-CM | POA: Diagnosis not present

## 2016-12-23 DIAGNOSIS — N186 End stage renal disease: Secondary | ICD-10-CM | POA: Diagnosis not present

## 2016-12-23 DIAGNOSIS — D631 Anemia in chronic kidney disease: Secondary | ICD-10-CM | POA: Diagnosis not present

## 2016-12-23 DIAGNOSIS — Z992 Dependence on renal dialysis: Secondary | ICD-10-CM | POA: Diagnosis not present

## 2016-12-23 DIAGNOSIS — D509 Iron deficiency anemia, unspecified: Secondary | ICD-10-CM | POA: Diagnosis not present

## 2016-12-24 IMAGING — CR DG CHEST 2V
2 series · 2 of 2 positions shown · non-contrast
Comparison: None.

CLINICAL DATA: Rib pain and weakness. Diarrhea for a month. History
of diabetes, congestive heart failure, and renal failure.

EXAM:
CHEST  2 VIEW

[chest pa]
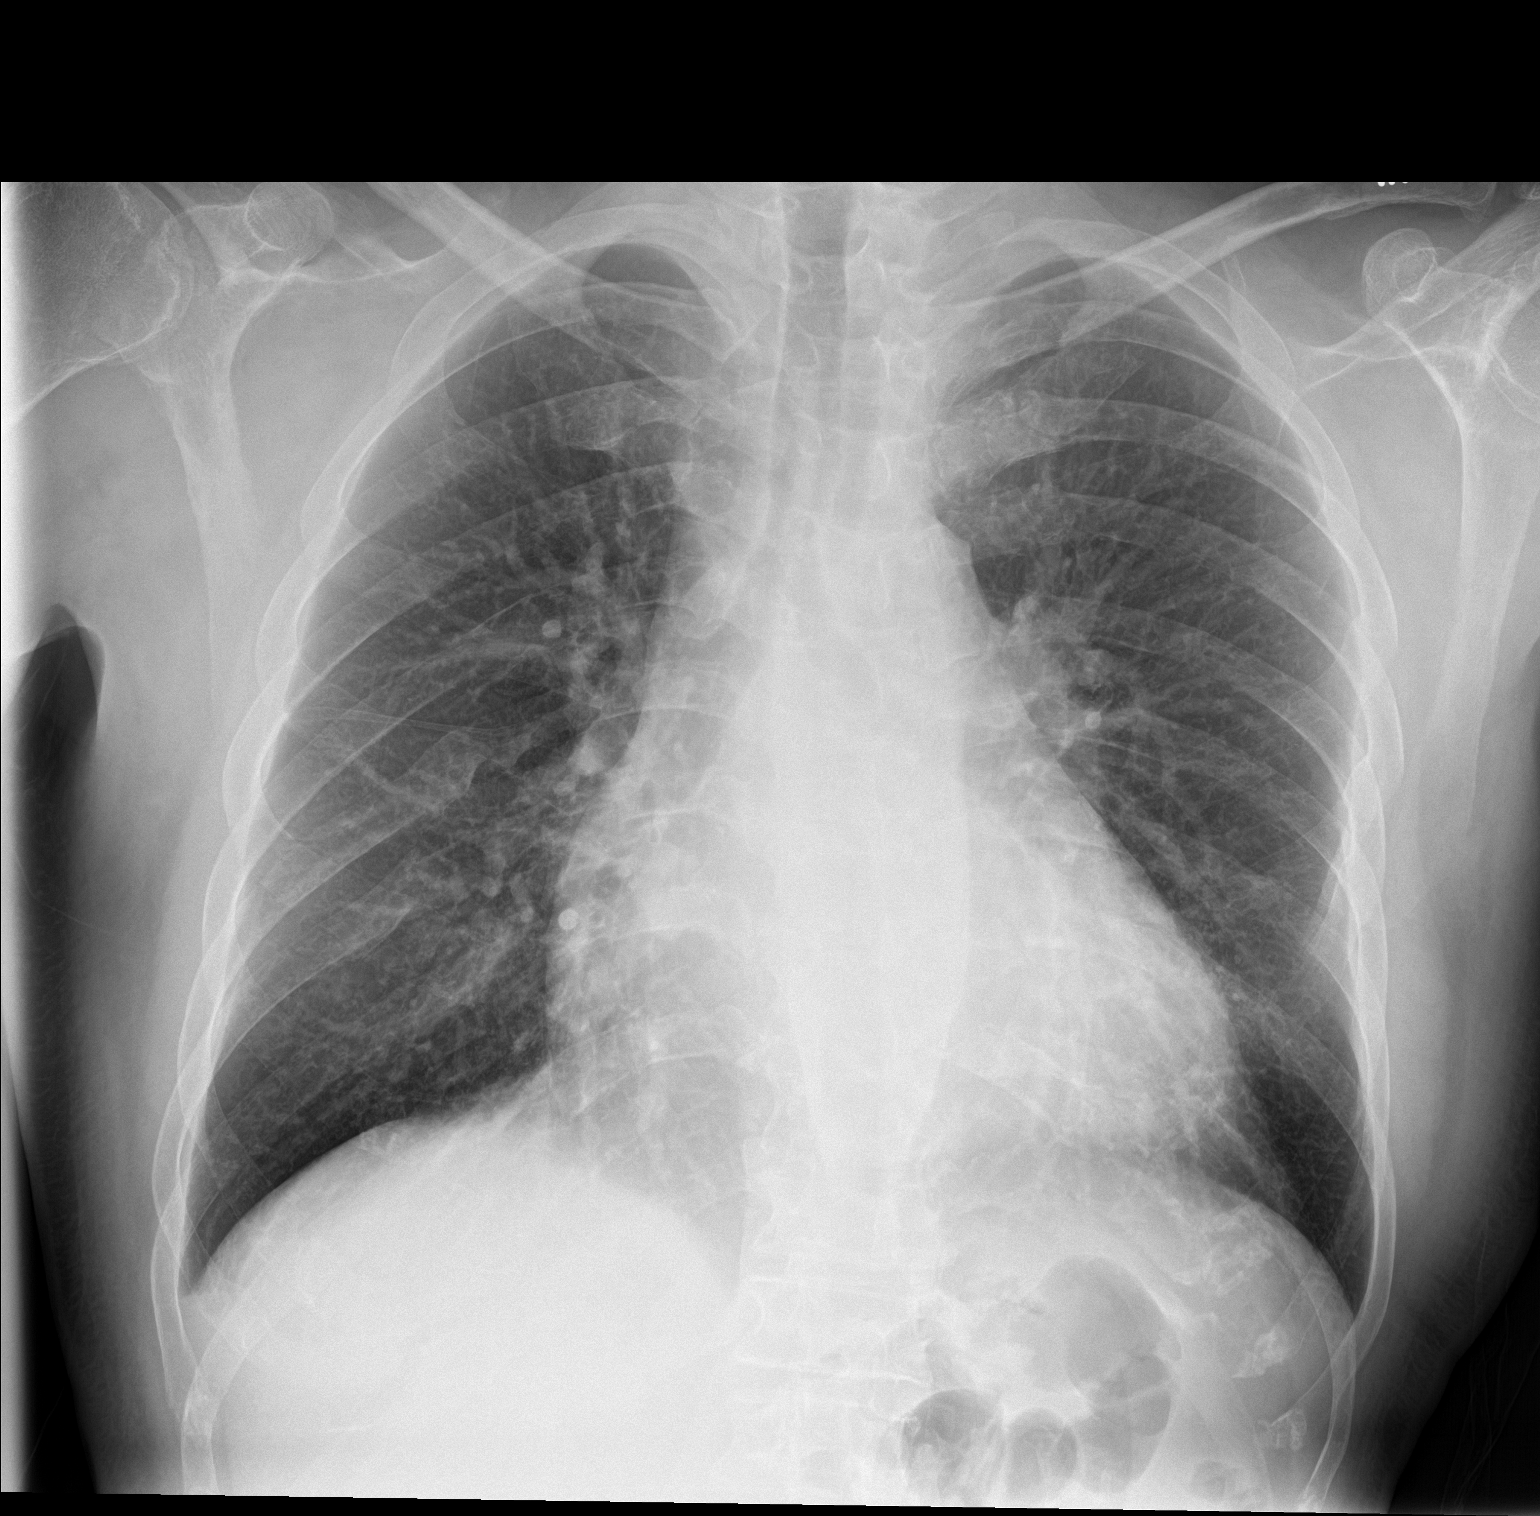

[chest lat]
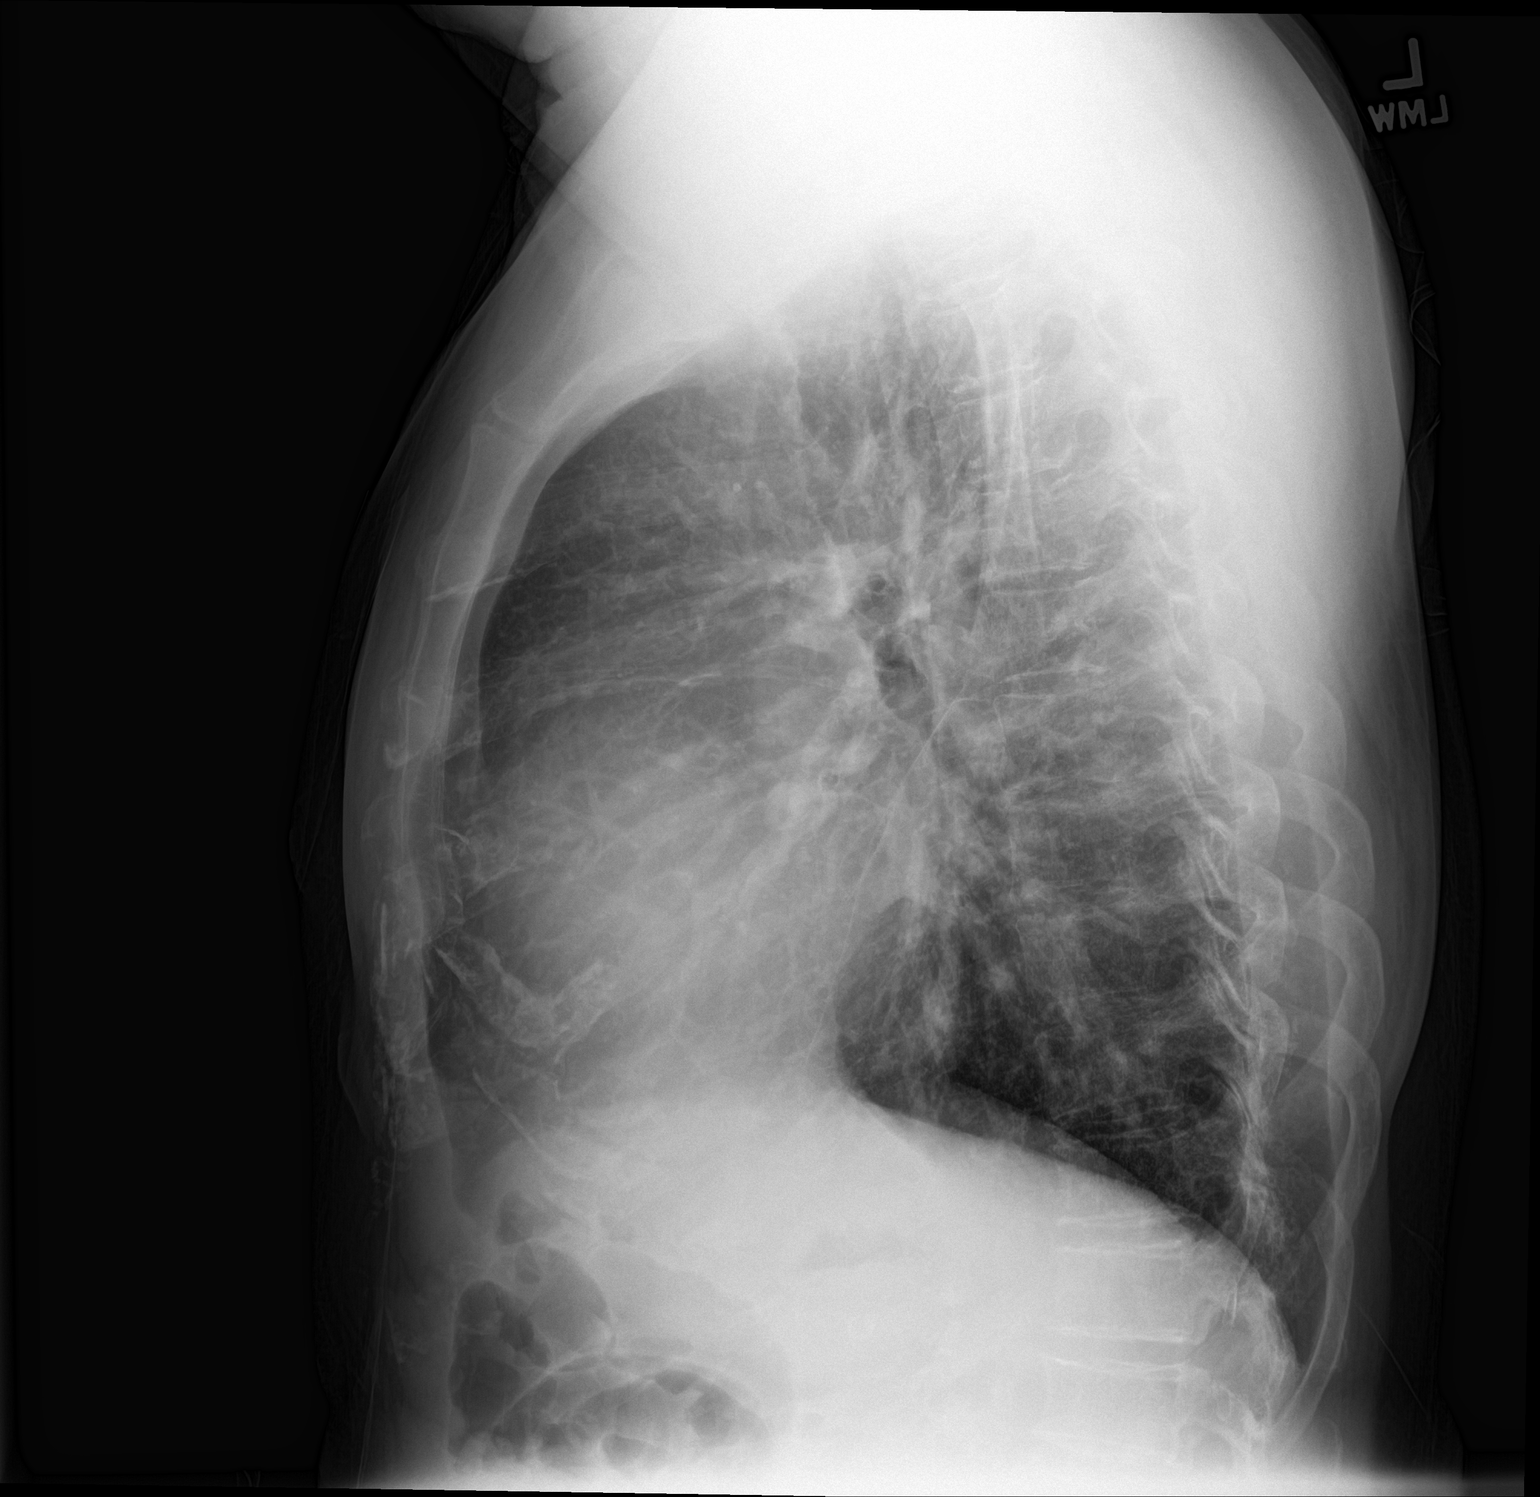

[2 of 2 positions shown; findings below may reference images not displayed]

FINDINGS: Borderline heart size and pulmonary vascularity. No edema or
consolidation in the lungs. No blunting of costophrenic angles. No
pneumothorax. Mediastinal contours appear intact. Degenerative
changes in the spine and shoulders.
IMPRESSION: Borderline heart size and pulmonary vascularity. No edema or
consolidation.

## 2016-12-25 DIAGNOSIS — D631 Anemia in chronic kidney disease: Secondary | ICD-10-CM | POA: Diagnosis not present

## 2016-12-25 DIAGNOSIS — N186 End stage renal disease: Secondary | ICD-10-CM | POA: Diagnosis not present

## 2016-12-25 DIAGNOSIS — Z992 Dependence on renal dialysis: Secondary | ICD-10-CM | POA: Diagnosis not present

## 2016-12-25 DIAGNOSIS — D509 Iron deficiency anemia, unspecified: Secondary | ICD-10-CM | POA: Diagnosis not present

## 2016-12-28 DIAGNOSIS — D509 Iron deficiency anemia, unspecified: Secondary | ICD-10-CM | POA: Diagnosis not present

## 2016-12-28 DIAGNOSIS — Z992 Dependence on renal dialysis: Secondary | ICD-10-CM | POA: Diagnosis not present

## 2016-12-28 DIAGNOSIS — N186 End stage renal disease: Secondary | ICD-10-CM | POA: Diagnosis not present

## 2016-12-28 DIAGNOSIS — D631 Anemia in chronic kidney disease: Secondary | ICD-10-CM | POA: Diagnosis not present

## 2016-12-30 DIAGNOSIS — D509 Iron deficiency anemia, unspecified: Secondary | ICD-10-CM | POA: Diagnosis not present

## 2016-12-30 DIAGNOSIS — Z992 Dependence on renal dialysis: Secondary | ICD-10-CM | POA: Diagnosis not present

## 2016-12-30 DIAGNOSIS — D631 Anemia in chronic kidney disease: Secondary | ICD-10-CM | POA: Diagnosis not present

## 2016-12-30 DIAGNOSIS — N186 End stage renal disease: Secondary | ICD-10-CM | POA: Diagnosis not present

## 2017-01-01 DIAGNOSIS — D509 Iron deficiency anemia, unspecified: Secondary | ICD-10-CM | POA: Diagnosis not present

## 2017-01-01 DIAGNOSIS — N186 End stage renal disease: Secondary | ICD-10-CM | POA: Diagnosis not present

## 2017-01-01 DIAGNOSIS — D631 Anemia in chronic kidney disease: Secondary | ICD-10-CM | POA: Diagnosis not present

## 2017-01-01 DIAGNOSIS — Z992 Dependence on renal dialysis: Secondary | ICD-10-CM | POA: Diagnosis not present

## 2017-01-04 DIAGNOSIS — Z992 Dependence on renal dialysis: Secondary | ICD-10-CM | POA: Diagnosis not present

## 2017-01-04 DIAGNOSIS — D631 Anemia in chronic kidney disease: Secondary | ICD-10-CM | POA: Diagnosis not present

## 2017-01-04 DIAGNOSIS — N186 End stage renal disease: Secondary | ICD-10-CM | POA: Diagnosis not present

## 2017-01-04 DIAGNOSIS — D509 Iron deficiency anemia, unspecified: Secondary | ICD-10-CM | POA: Diagnosis not present

## 2017-01-06 DIAGNOSIS — D631 Anemia in chronic kidney disease: Secondary | ICD-10-CM | POA: Diagnosis not present

## 2017-01-06 DIAGNOSIS — D509 Iron deficiency anemia, unspecified: Secondary | ICD-10-CM | POA: Diagnosis not present

## 2017-01-06 DIAGNOSIS — N186 End stage renal disease: Secondary | ICD-10-CM | POA: Diagnosis not present

## 2017-01-06 DIAGNOSIS — Z992 Dependence on renal dialysis: Secondary | ICD-10-CM | POA: Diagnosis not present

## 2017-01-08 DIAGNOSIS — Z992 Dependence on renal dialysis: Secondary | ICD-10-CM | POA: Diagnosis not present

## 2017-01-08 DIAGNOSIS — D509 Iron deficiency anemia, unspecified: Secondary | ICD-10-CM | POA: Diagnosis not present

## 2017-01-08 DIAGNOSIS — N186 End stage renal disease: Secondary | ICD-10-CM | POA: Diagnosis not present

## 2017-01-08 DIAGNOSIS — D631 Anemia in chronic kidney disease: Secondary | ICD-10-CM | POA: Diagnosis not present

## 2017-01-11 DIAGNOSIS — Z992 Dependence on renal dialysis: Secondary | ICD-10-CM | POA: Diagnosis not present

## 2017-01-11 DIAGNOSIS — N186 End stage renal disease: Secondary | ICD-10-CM | POA: Diagnosis not present

## 2017-01-11 DIAGNOSIS — D509 Iron deficiency anemia, unspecified: Secondary | ICD-10-CM | POA: Diagnosis not present

## 2017-01-11 DIAGNOSIS — D631 Anemia in chronic kidney disease: Secondary | ICD-10-CM | POA: Diagnosis not present

## 2017-01-13 DIAGNOSIS — N186 End stage renal disease: Secondary | ICD-10-CM | POA: Diagnosis not present

## 2017-01-13 DIAGNOSIS — D509 Iron deficiency anemia, unspecified: Secondary | ICD-10-CM | POA: Diagnosis not present

## 2017-01-13 DIAGNOSIS — Z992 Dependence on renal dialysis: Secondary | ICD-10-CM | POA: Diagnosis not present

## 2017-01-13 DIAGNOSIS — D631 Anemia in chronic kidney disease: Secondary | ICD-10-CM | POA: Diagnosis not present

## 2017-01-15 DIAGNOSIS — Z992 Dependence on renal dialysis: Secondary | ICD-10-CM | POA: Diagnosis not present

## 2017-01-15 DIAGNOSIS — D509 Iron deficiency anemia, unspecified: Secondary | ICD-10-CM | POA: Diagnosis not present

## 2017-01-15 DIAGNOSIS — D631 Anemia in chronic kidney disease: Secondary | ICD-10-CM | POA: Diagnosis not present

## 2017-01-15 DIAGNOSIS — N186 End stage renal disease: Secondary | ICD-10-CM | POA: Diagnosis not present

## 2017-01-18 DIAGNOSIS — Z992 Dependence on renal dialysis: Secondary | ICD-10-CM | POA: Diagnosis not present

## 2017-01-18 DIAGNOSIS — D631 Anemia in chronic kidney disease: Secondary | ICD-10-CM | POA: Diagnosis not present

## 2017-01-18 DIAGNOSIS — N186 End stage renal disease: Secondary | ICD-10-CM | POA: Diagnosis not present

## 2017-01-18 DIAGNOSIS — D509 Iron deficiency anemia, unspecified: Secondary | ICD-10-CM | POA: Diagnosis not present

## 2017-01-20 DIAGNOSIS — D509 Iron deficiency anemia, unspecified: Secondary | ICD-10-CM | POA: Diagnosis not present

## 2017-01-20 DIAGNOSIS — D631 Anemia in chronic kidney disease: Secondary | ICD-10-CM | POA: Diagnosis not present

## 2017-01-20 DIAGNOSIS — N186 End stage renal disease: Secondary | ICD-10-CM | POA: Diagnosis not present

## 2017-01-20 DIAGNOSIS — Z992 Dependence on renal dialysis: Secondary | ICD-10-CM | POA: Diagnosis not present

## 2017-01-22 ENCOUNTER — Encounter (INDEPENDENT_AMBULATORY_CARE_PROVIDER_SITE_OTHER): Payer: Self-pay

## 2017-01-22 DIAGNOSIS — D509 Iron deficiency anemia, unspecified: Secondary | ICD-10-CM | POA: Diagnosis not present

## 2017-01-22 DIAGNOSIS — D631 Anemia in chronic kidney disease: Secondary | ICD-10-CM | POA: Diagnosis not present

## 2017-01-22 DIAGNOSIS — N186 End stage renal disease: Secondary | ICD-10-CM | POA: Diagnosis not present

## 2017-01-22 DIAGNOSIS — Z992 Dependence on renal dialysis: Secondary | ICD-10-CM | POA: Diagnosis not present

## 2017-01-25 DIAGNOSIS — Z992 Dependence on renal dialysis: Secondary | ICD-10-CM | POA: Diagnosis not present

## 2017-01-25 DIAGNOSIS — D631 Anemia in chronic kidney disease: Secondary | ICD-10-CM | POA: Diagnosis not present

## 2017-01-25 DIAGNOSIS — D509 Iron deficiency anemia, unspecified: Secondary | ICD-10-CM | POA: Diagnosis not present

## 2017-01-25 DIAGNOSIS — N186 End stage renal disease: Secondary | ICD-10-CM | POA: Diagnosis not present

## 2017-01-27 ENCOUNTER — Other Ambulatory Visit (INDEPENDENT_AMBULATORY_CARE_PROVIDER_SITE_OTHER): Payer: Self-pay

## 2017-01-27 DIAGNOSIS — N186 End stage renal disease: Secondary | ICD-10-CM | POA: Diagnosis not present

## 2017-01-27 DIAGNOSIS — D631 Anemia in chronic kidney disease: Secondary | ICD-10-CM | POA: Diagnosis not present

## 2017-01-27 DIAGNOSIS — Z992 Dependence on renal dialysis: Principal | ICD-10-CM

## 2017-01-27 DIAGNOSIS — D509 Iron deficiency anemia, unspecified: Secondary | ICD-10-CM | POA: Diagnosis not present

## 2017-01-28 ENCOUNTER — Ambulatory Visit
Admission: RE | Admit: 2017-01-28 | Discharge: 2017-01-28 | Disposition: A | Discharge status: Home / Self Care | Payer: Medicare Other | Source: Ambulatory Visit | Attending: Vascular Surgery | Admitting: Vascular Surgery

## 2017-01-28 ENCOUNTER — Encounter
Admission: RE | Disposition: A | Discharge status: Home / Self Care | Payer: Self-pay | Source: Ambulatory Visit | Attending: Vascular Surgery

## 2017-01-28 DIAGNOSIS — E119 Type 2 diabetes mellitus without complications: Secondary | ICD-10-CM | POA: Diagnosis not present

## 2017-01-28 DIAGNOSIS — Z880 Allergy status to penicillin: Secondary | ICD-10-CM | POA: Diagnosis not present

## 2017-01-28 DIAGNOSIS — Z9841 Cataract extraction status, right eye: Secondary | ICD-10-CM | POA: Insufficient documentation

## 2017-01-28 DIAGNOSIS — E1122 Type 2 diabetes mellitus with diabetic chronic kidney disease: Secondary | ICD-10-CM | POA: Insufficient documentation

## 2017-01-28 DIAGNOSIS — Z8673 Personal history of transient ischemic attack (TIA), and cerebral infarction without residual deficits: Secondary | ICD-10-CM | POA: Insufficient documentation

## 2017-01-28 DIAGNOSIS — Z8249 Family history of ischemic heart disease and other diseases of the circulatory system: Secondary | ICD-10-CM | POA: Insufficient documentation

## 2017-01-28 DIAGNOSIS — Z905 Acquired absence of kidney: Secondary | ICD-10-CM | POA: Insufficient documentation

## 2017-01-28 DIAGNOSIS — I132 Hypertensive heart and chronic kidney disease with heart failure and with stage 5 chronic kidney disease, or end stage renal disease: Secondary | ICD-10-CM | POA: Insufficient documentation

## 2017-01-28 DIAGNOSIS — T82590A Other mechanical complication of surgically created arteriovenous fistula, initial encounter: Secondary | ICD-10-CM | POA: Diagnosis not present

## 2017-01-28 DIAGNOSIS — Y832 Surgical operation with anastomosis, bypass or graft as the cause of abnormal reaction of the patient, or of later complication, without mention of misadventure at the time of the procedure: Secondary | ICD-10-CM | POA: Diagnosis not present

## 2017-01-28 DIAGNOSIS — Z85528 Personal history of other malignant neoplasm of kidney: Secondary | ICD-10-CM | POA: Diagnosis not present

## 2017-01-28 DIAGNOSIS — Z888 Allergy status to other drugs, medicaments and biological substances status: Secondary | ICD-10-CM | POA: Insufficient documentation

## 2017-01-28 DIAGNOSIS — Z992 Dependence on renal dialysis: Secondary | ICD-10-CM | POA: Diagnosis not present

## 2017-01-28 DIAGNOSIS — N186 End stage renal disease: Secondary | ICD-10-CM | POA: Insufficient documentation

## 2017-01-28 DIAGNOSIS — I2510 Atherosclerotic heart disease of native coronary artery without angina pectoris: Secondary | ICD-10-CM | POA: Insufficient documentation

## 2017-01-28 DIAGNOSIS — N400 Enlarged prostate without lower urinary tract symptoms: Secondary | ICD-10-CM | POA: Insufficient documentation

## 2017-01-28 DIAGNOSIS — E785 Hyperlipidemia, unspecified: Secondary | ICD-10-CM | POA: Diagnosis not present

## 2017-01-28 DIAGNOSIS — I272 Pulmonary hypertension, unspecified: Secondary | ICD-10-CM | POA: Diagnosis not present

## 2017-01-28 DIAGNOSIS — T82868A Thrombosis of vascular prosthetic devices, implants and grafts, initial encounter: Secondary | ICD-10-CM | POA: Diagnosis not present

## 2017-01-28 DIAGNOSIS — Z833 Family history of diabetes mellitus: Secondary | ICD-10-CM | POA: Diagnosis not present

## 2017-01-28 DIAGNOSIS — I509 Heart failure, unspecified: Secondary | ICD-10-CM | POA: Insufficient documentation

## 2017-01-28 DIAGNOSIS — Z87891 Personal history of nicotine dependence: Secondary | ICD-10-CM | POA: Insufficient documentation

## 2017-01-28 DIAGNOSIS — I10 Essential (primary) hypertension: Secondary | ICD-10-CM | POA: Diagnosis not present

## 2017-01-28 HISTORY — PX: A/V FISTULAGRAM: CATH118298

## 2017-01-28 LAB — POTASSIUM (ARMC VASCULAR LAB ONLY): Potassium (ARMC vascular lab): 3.7 (ref 3.5–5.1)

## 2017-01-28 LAB — GLUCOSE, CAPILLARY: Glucose-Capillary: 125 mg/dL — ABNORMAL HIGH (ref 65–99)

## 2017-01-28 SURGERY — A/V FISTULAGRAM
Anesthesia: Moderate Sedation | Laterality: Left

## 2017-01-28 MED ORDER — CLINDAMYCIN PHOSPHATE 300 MG/50ML IV SOLN
INTRAVENOUS | Status: AC
  Filled 2017-01-28: qty 50

## 2017-01-28 MED ORDER — SODIUM CHLORIDE 0.9 % IV SOLN: INTRAVENOUS | Status: DC

## 2017-01-28 MED ORDER — HYDRALAZINE HCL 20 MG/ML IJ SOLN: 5.0000 mg | INTRAMUSCULAR | Status: DC | PRN

## 2017-01-28 MED ORDER — OXYCODONE-ACETAMINOPHEN 5-325 MG PO TABS
1.0000 | ORAL_TABLET | ORAL | Status: DC | PRN
Start: 2017-01-28 — End: 2017-01-28

## 2017-01-28 MED ORDER — ONDANSETRON HCL 4 MG/2ML IJ SOLN: 4.0000 mg | Freq: Four times a day (QID) | INTRAMUSCULAR | Status: DC | PRN

## 2017-01-28 MED ORDER — ACETAMINOPHEN 325 MG PO TABS: 325.0000 mg | ORAL_TABLET | ORAL | Status: DC | PRN

## 2017-01-28 MED ORDER — MIDAZOLAM HCL 5 MG/5ML IJ SOLN
INTRAMUSCULAR | Status: AC
  Filled 2017-01-28: qty 5

## 2017-01-28 MED ORDER — GUAIFENESIN-DM 100-10 MG/5ML PO SYRP
15.0000 mL | ORAL_SOLUTION | ORAL | Status: DC | PRN
  Filled 2017-01-28: qty 15

## 2017-01-28 MED ORDER — SODIUM CHLORIDE 0.9 % IV SOLN: 500.0000 mL | Freq: Once | INTRAVENOUS | Status: DC | PRN

## 2017-01-28 MED ORDER — ACETAMINOPHEN 325 MG RE SUPP
325.0000 mg | RECTAL | Status: DC | PRN
  Filled 2017-01-28: qty 2

## 2017-01-28 MED ORDER — HEPARIN SODIUM (PORCINE) 1000 UNIT/ML IJ SOLN
INTRAMUSCULAR | Status: AC
  Filled 2017-01-28: qty 1

## 2017-01-28 MED ORDER — HEPARIN (PORCINE) IN NACL 2-0.9 UNIT/ML-% IJ SOLN
INTRAMUSCULAR | Status: AC
  Filled 2017-01-28: qty 1000

## 2017-01-28 MED ORDER — FENTANYL CITRATE (PF) 100 MCG/2ML IJ SOLN
INTRAMUSCULAR | Status: AC
Start: 2017-01-28 — End: 2017-01-28
  Filled 2017-01-28: qty 2

## 2017-01-28 MED ORDER — MORPHINE SULFATE (PF) 4 MG/ML IV SOLN: 2.0000 mg | INTRAVENOUS | Status: DC | PRN

## 2017-01-28 MED ORDER — FENTANYL CITRATE (PF) 100 MCG/2ML IJ SOLN
INTRAMUSCULAR | Status: DC | PRN
  Administered 2017-01-28: 50 ug via INTRAVENOUS
  Administered 2017-01-28: 50 ug

## 2017-01-28 MED ORDER — PHENOL 1.4 % MT LIQD
1.0000 | OROMUCOSAL | Status: DC | PRN
  Filled 2017-01-28: qty 177

## 2017-01-28 MED ORDER — LIDOCAINE-EPINEPHRINE (PF) 2 %-1:200000 IJ SOLN
INTRAMUSCULAR | Status: AC
  Filled 2017-01-28: qty 20

## 2017-01-28 MED ORDER — ALUM & MAG HYDROXIDE-SIMETH 200-200-20 MG/5ML PO SUSP: 15.0000 mL | ORAL | Status: DC | PRN

## 2017-01-28 MED ORDER — IOPAMIDOL (ISOVUE-300) INJECTION 61%
INTRAVENOUS | Status: DC | PRN
  Administered 2017-01-28: 20 mL via INTRA_ARTERIAL

## 2017-01-28 MED ORDER — CLINDAMYCIN PHOSPHATE 300 MG/50ML IV SOLN
300.0000 mg | Freq: Once | INTRAVENOUS | Status: AC
  Administered 2017-01-28: 300 mg via INTRAVENOUS

## 2017-01-28 MED ORDER — METOPROLOL TARTRATE 5 MG/5ML IV SOLN: 2.0000 mg | INTRAVENOUS | Status: DC | PRN

## 2017-01-28 MED ORDER — LABETALOL HCL 5 MG/ML IV SOLN: 10.0000 mg | INTRAVENOUS | Status: DC | PRN

## 2017-01-28 MED ORDER — MIDAZOLAM HCL 2 MG/2ML IJ SOLN
INTRAMUSCULAR | Status: DC | PRN
  Administered 2017-01-28: 2 mg
  Administered 2017-01-28: 2 mg via INTRAVENOUS

## 2017-01-28 SURGICAL SUPPLY — 5 items
CANNULA 5F STIFF (CANNULA) ×3 IMPLANT
DRAPE BRACHIAL (DRAPES) ×3 IMPLANT
PACK ANGIOGRAPHY (CUSTOM PROCEDURE TRAY) ×3 IMPLANT
SHEATH BRITE TIP 6FRX5.5 (SHEATH) ×3 IMPLANT
TOWEL OR 17X26 4PK STRL BLUE (TOWEL DISPOSABLE) ×3 IMPLANT

## 2017-01-28 NOTE — Op Note (Signed)
 VEIN AND VASCULAR SURGERY    OPERATIVE NOTE   PROCEDURE: 1.   left radiocephalic arteriovenous fistula cannulation under ultrasound guidance 2.   left arm fistulagram including central venogram   PRE-OPERATIVE DIAGNOSIS: 1. ESRD 2. Poorly functional left arm AVF  POST-OPERATIVE DIAGNOSIS: same as above   SURGEON: [name], MD  ANESTHESIA: local with MCS  ESTIMATED BLOOD LOSS: minimal  FINDING(S): 1. Patent fistula with not stenosis  SPECIMEN(S):  None  CONTRAST: 20 cc  FLUORO TIME: 0.4 minutes  MODERATE CONSCIOUS SEDATION TIME: Approximately 15 minutes with 2 mg of Versed and 50 mcg of Fentanyl   INDICATIONS: VERDIS KOVAL is a 56 y.o. male who presents with malfunctioning left radiocephalic arteriovenous fistula.  The patient is scheduled for left arm fistulagram.  The patient is aware the risks include but are not limited to: bleeding, infection, thrombosis of the cannulated access, and possible anaphylactic reaction to the contrast.  The patient is aware of the risks of the procedure and elects to proceed forward.  DESCRIPTION: After full informed written consent was obtained, the patient was brought back to the angiography suite and placed supine upon the angiography table.  The patient was connected to monitoring equipment. Moderate conscious sedation was administered with a face to face encounter with the patient throughout the procedure with my supervision of the RN administering medicines and monitoring the patient's vital signs and mental status throughout from the start of the procedure until the patient was taken to the recovery room. The left arm was prepped and draped in the standard fashion for a percutaneous access intervention.  Under ultrasound guidance, the left radiocephalic arteriovenous fistula was cannulated with a micropuncture needle under direct ultrasound guidance and a permanent image was performed.  The microwire was advanced into the  fistula and the needle was exchanged for the a microsheath.  I then upsized to a 6 Fr Sheath and imaging was performed.  Hand injections were completed to image the access including the central venous system. This demonstrated a patent left radiocephalic AV fistula with some aneurysmal sites at access sites but otherwise no significant abnormalities, stenoses, with dual outflow in the upper arm and patent central venous circulation.  Based on the imaging, no further intervention is necessary.  The wire and balloon were removed from the sheath.  A 4-0 Monocryl purse-string suture was sewn around the sheath.  The sheath was removed while tying down the suture.  A sterile bandage was applied to the puncture site.  COMPLICATIONS: None  CONDITION: Stable   [name]  01/28/2017 9:08 AM   This note was created with Dragon Medical transcription system. Any errors in dictation are purely unintentional.

## 2017-01-28 NOTE — H&P (Signed)
[facility] Admission History & Physical  MRN : [id number]  Richard Zavala is a 56 y.o. (1961/06/20) male who presents with chief complaint of No chief complaint on file. Marland Kitchen  History of Present Illness: I am asked to evaluate the patient by the dialysis center. The patient was sent here because they were unable to achieve adequate dialysis this morning. Furthermore the Center states there is very poor thrill and bruit. The patient states there there have been increasing problems with the access, such as "pulling clots" during dialysis and prolonged bleeding after decannulation. The patient estimates these problems have been going on for several weeks. The patient is unaware of any other change.  Patient denies pain or tenderness overlying the access.  There is no pain with dialysis.  The patient denies hand pain or finger pain consistent with steal syndrome.   There have been past interventions or declots of this access.  The patient is not chronically hypotensive on dialysis.  Current Facility-Administered Medications  Medication Dose Route Frequency Provider Last Rate Last Dose  . clindamycin (CLEOCIN) 300 MG/50ML IVPB           . 0.9 %  sodium chloride infusion   Intravenous Continuous Algernon Huxley, MD      . clindamycin (CLEOCIN) IVPB 300 mg  300 mg Intravenous Once Algernon Huxley, MD   300 mg at 01/28/17 0830  . fentaNYL (SUBLIMAZE) injection    PRN Algernon Huxley, MD   50 mcg at 01/28/17 [phone]  . midazolam (VERSED) injection    PRN Algernon Huxley, MD   2 mg at 01/28/17 5093    Past Medical History:  Diagnosis Date  . Anemia   . Blind   . BPH (benign prostatic hyperplasia)   . CAD (coronary artery disease)   . CHF (congestive heart failure) ([place])   . Chronic kidney disease (CKD), stage IV (severe) (HCC)    DIALYSIS  . Diabetes mellitus without complication ([place])   . ESRD (end stage renal disease) ([place])    Monday-Wednesday-Friday dialysis  . Hyperlipemia   .  Hypertension   . Neuropathy ([place])   . Osteoporosis   . Pulmonary HTN   . Stroke [facility])    2013  . TIA (transient ischemic attack)   . TRD (traction retinal detachment)   . Wilms' tumor [facility])     Past Surgical History:  Procedure Laterality Date  . AV FISTULA PLACEMENT    . CARDIAC CATHETERIZATION    . CATARACT EXTRACTION W/PHACO Right 05/14/2016   Procedure: CATARACT EXTRACTION PHACO AND INTRAOCULAR LENS PLACEMENT (IOC);  Surgeon: Eulogio Bear, MD;  Location: [facility] ORS;  Service: Ophthalmology;  Laterality: Right;  [place] 1.46 AP% 11.5 CDE 12.33 FLUID PACK LOT # [id number] H  . EYE SURGERY    . FRACTURE SURGERY     RIGHT LEG WITH ROD  . HIP SURGERY    . NEPHRECTOMY RADICAL    . PERIPHERAL VASCULAR CATHETERIZATION N/A 08/28/2015   Procedure: A/V Shuntogram/Fistulagram;  Surgeon: Algernon Huxley, MD;  Location: [place] [facility];  Service: Cardiovascular;  Laterality: N/A;  . PERIPHERAL VASCULAR CATHETERIZATION Left 08/28/2015   Procedure: A/V Shunt Intervention;  Surgeon: Algernon Huxley, MD;  Location: [place] [facility];  Service: Cardiovascular;  Laterality: Left;    Social History Social History  Substance Use Topics  . Smoking status: Former Smoker    Packs/day: 1.00    Types: Cigarettes    Quit date:  06/26/2012  . Smokeless tobacco: Never Used  . Alcohol use No    Family History Family History  Problem Relation Age of Onset  . CAD Father   . COPD Father   . CAD Paternal Grandfather   . Diabetes Maternal Aunt   . Diabetes Maternal Uncle     No family history of bleeding or clotting disorders, autoimmune disease or porphyria  Allergies  Allergen Reactions  . Metformin Other (See Comments)    Severe diarrhea   . Penicillins Hives    Has patient had a PCN reaction causing immediate rash, facial/tongue/throat swelling, SOB or lightheadedness with hypotension:Yes Has patient had a PCN reaction causing severe rash involving mucus membranes or skin necrosis:Yes Has patient  had a PCN reaction that required hospitalization:inpatient at the time of reaction. Has patient had a PCN reaction occurring within the last 10 years:No If all of the above answers are "NO", then may proceed with Cephalosporin use.      REVIEW OF SYSTEMS (Negative unless checked)  Constitutional: [] Weight loss  [] Fever  [] Chills Cardiac: [] Chest pain   [] Chest pressure   [] Palpitations   [] Shortness of breath when laying flat   [] Shortness of breath at rest   [x] Shortness of breath with exertion. Vascular:  [] Pain in legs with walking   [] Pain in legs at rest   [] Pain in legs when laying flat   [] Claudication   [] Pain in feet when walking  [] Pain in feet at rest  [] Pain in feet when laying flat   [] History of DVT   [] Phlebitis   [] Swelling in legs   [] Varicose veins   [] Non-healing ulcers Pulmonary:   [] Uses home oxygen   [] Productive cough   [] Hemoptysis   [] Wheeze  [] COPD   [] Asthma Neurologic:  [] Dizziness  [] Blackouts   [] Seizures   [] History of stroke   [] History of TIA  [] Aphasia   [] Temporary blindness   [] Dysphagia   [] Weakness or numbness in arms   [] Weakness or numbness in legs Musculoskeletal:  [] Arthritis   [] Joint swelling   [] Joint pain   [] Low back pain Hematologic:  [] Easy bruising  [] Easy bleeding   [] Hypercoagulable state   [] Anemic  [] Hepatitis Gastrointestinal:  [] Blood in stool   [] Vomiting blood  [] Gastroesophageal reflux/heartburn   [] Difficulty swallowing. Genitourinary:  [x] Chronic kidney disease   [] Difficult urination  [] Frequent urination  [] Burning with urination   [] Blood in urine Skin:  [] Rashes   [] Ulcers   [] Wounds Psychological:  [] History of anxiety   []  History of major depression.  Physical Examination  Vitals:   01/28/17 0657 01/28/17 0830  BP: (!) 153/70   Pulse: 61   Resp: 16   Temp: 97.6 F (36.4 C)   TempSrc: Oral   SpO2: 96% 95%  Weight: 79.4 kg (175 lb)   Height: 5\' 8"  (1.727 m)    Body mass index is 26.61 kg/m. Gen: WD/WN, NAD Head:  [place]/AT, No temporalis wasting. Prominent temp pulse not noted. Ear/Nose/Throat: Hearing grossly intact, nares w/o erythema or drainage, oropharynx w/o Erythema/Exudate,  Eyes: Conjunctiva clear, sclera non-icteric Neck: Trachea midline.  No JVD.  Pulmonary:  Good air movement, respirations not labored, no use of accessory muscles.  Cardiac: RRR, normal S1, S2. Vascular: thrill present in AVF Vessel Right Left  Radial Palpable Palpable  Ulnar Not Palpable Not Palpable  Brachial Palpable Palpable  Carotid Palpable, without bruit Palpable, without bruit  Gastrointestinal: soft, non-tender/non-distended. No guarding/reflex.  Musculoskeletal: M/S 5/5 throughout.  Extremities without ischemic changes.  No  deformity or atrophy.  Neurologic: Sensation grossly intact in extremities.  Symmetrical.  Speech is fluent. Motor exam as listed above. Psychiatric: Judgment intact, Mood & affect appropriate for pt's clinical situation. Dermatologic: No rashes or ulcers noted.  No cellulitis or open wounds. Lymph : No Cervical, Axillary, or Inguinal lymphadenopathy.   CBC Lab Results  Component Value Date   WBC 6.5 08/22/2016   HGB 10.9 (L) 08/22/2016   HCT 31.2 (L) 08/22/2016   MCV 91.6 08/22/2016   PLT 176 08/22/2016    BMET    Component Value Date/Time   NA 138 08/22/2016 0449   NA 141 10/28/2012 0543   K 2.9 (L) 08/22/2016 0449   K 5.2 (H) 10/28/2012 0543   CL 102 08/22/2016 0449   CL 111 (H) 10/28/2012 0543   CO2 29 08/22/2016 0449   CO2 22 10/28/2012 0543   GLUCOSE 84 08/22/2016 0449   GLUCOSE 151 (H) 10/28/2012 0543   BUN 30 (H) 08/22/2016 0449   BUN 27 (H) 10/28/2012 0543   CREATININE 5.17 (H) 08/22/2016 0449   CREATININE 1.52 (H) 10/28/2012 0543   CALCIUM 7.6 (L) 08/22/2016 0449   CALCIUM 8.0 (L) 10/28/2012 0543   GFRNONAA 12 (L) 08/22/2016 0449   GFRNONAA 53 (L) 10/28/2012 0543   GFRAA 13 (L) 08/22/2016 0449   GFRAA >60 10/28/2012 0543   CrCl cannot be calculated (Patient's  most recent lab result is older than the maximum 21 days allowed.).  COAG Lab Results  Component Value Date   INR 1.08 08/21/2016    Radiology No results found.  Assessment/Plan 1.  Complication dialysis device with thrombosis AV access:  Patient's left arm dialysis access is malfunctioning. The patient will undergo angiography and correction of any problems using interventional techniques with the hope of restoring function to the access.  The risks and benefits were described to the patient.  All questions were answered.  The patient agrees to proceed with angiography and intervention. Potassium will be drawn to ensure that it is an appropriate level prior to performing intervention. 2.  End-stage renal disease requiring hemodialysis:  Patient will continue dialysis therapy without further interruption if a successful intervention is not achieved then a tunneled catheter will be placed. Dialysis has already been arranged. 3.  Hypertension:  Patient will continue medical management; nephrology is following no changes in oral medications. 4. Diabetes mellitus:  Glucose will be monitored and oral medications been held this morning once the patient has undergone the patient's procedure po intake will be reinitiated and again Accu-Cheks will be used to assess the blood glucose level and treat as needed. The patient will be restarted on the patient's usual hypoglycemic regime 5.  Coronary artery disease:  EKG will be monitored. Nitrates will be used if needed. The patient's oral cardiac medications will be continued.    [name], MD  01/28/2017 8:36 AM

## 2017-01-29 ENCOUNTER — Encounter: Payer: Self-pay | Admitting: Vascular Surgery

## 2017-01-29 DIAGNOSIS — N186 End stage renal disease: Secondary | ICD-10-CM | POA: Diagnosis not present

## 2017-01-29 DIAGNOSIS — D631 Anemia in chronic kidney disease: Secondary | ICD-10-CM | POA: Diagnosis not present

## 2017-01-29 DIAGNOSIS — D509 Iron deficiency anemia, unspecified: Secondary | ICD-10-CM | POA: Diagnosis not present

## 2017-01-29 DIAGNOSIS — Z992 Dependence on renal dialysis: Secondary | ICD-10-CM | POA: Diagnosis not present

## 2017-02-01 DIAGNOSIS — D509 Iron deficiency anemia, unspecified: Secondary | ICD-10-CM | POA: Diagnosis not present

## 2017-02-01 DIAGNOSIS — D631 Anemia in chronic kidney disease: Secondary | ICD-10-CM | POA: Diagnosis not present

## 2017-02-01 DIAGNOSIS — Z992 Dependence on renal dialysis: Secondary | ICD-10-CM | POA: Diagnosis not present

## 2017-02-01 DIAGNOSIS — N186 End stage renal disease: Secondary | ICD-10-CM | POA: Diagnosis not present

## 2017-02-03 DIAGNOSIS — D509 Iron deficiency anemia, unspecified: Secondary | ICD-10-CM | POA: Diagnosis not present

## 2017-02-03 DIAGNOSIS — Z992 Dependence on renal dialysis: Secondary | ICD-10-CM | POA: Diagnosis not present

## 2017-02-03 DIAGNOSIS — D631 Anemia in chronic kidney disease: Secondary | ICD-10-CM | POA: Diagnosis not present

## 2017-02-03 DIAGNOSIS — N186 End stage renal disease: Secondary | ICD-10-CM | POA: Diagnosis not present

## 2017-02-05 DIAGNOSIS — D631 Anemia in chronic kidney disease: Secondary | ICD-10-CM | POA: Diagnosis not present

## 2017-02-05 DIAGNOSIS — N186 End stage renal disease: Secondary | ICD-10-CM | POA: Diagnosis not present

## 2017-02-05 DIAGNOSIS — Z992 Dependence on renal dialysis: Secondary | ICD-10-CM | POA: Diagnosis not present

## 2017-02-05 DIAGNOSIS — D509 Iron deficiency anemia, unspecified: Secondary | ICD-10-CM | POA: Diagnosis not present

## 2017-02-08 DIAGNOSIS — N186 End stage renal disease: Secondary | ICD-10-CM | POA: Diagnosis not present

## 2017-02-08 DIAGNOSIS — D631 Anemia in chronic kidney disease: Secondary | ICD-10-CM | POA: Diagnosis not present

## 2017-02-08 DIAGNOSIS — D509 Iron deficiency anemia, unspecified: Secondary | ICD-10-CM | POA: Diagnosis not present

## 2017-02-08 DIAGNOSIS — Z992 Dependence on renal dialysis: Secondary | ICD-10-CM | POA: Diagnosis not present

## 2017-02-10 DIAGNOSIS — D509 Iron deficiency anemia, unspecified: Secondary | ICD-10-CM | POA: Diagnosis not present

## 2017-02-10 DIAGNOSIS — N186 End stage renal disease: Secondary | ICD-10-CM | POA: Diagnosis not present

## 2017-02-10 DIAGNOSIS — D631 Anemia in chronic kidney disease: Secondary | ICD-10-CM | POA: Diagnosis not present

## 2017-02-10 DIAGNOSIS — Z992 Dependence on renal dialysis: Secondary | ICD-10-CM | POA: Diagnosis not present

## 2017-02-12 DIAGNOSIS — D631 Anemia in chronic kidney disease: Secondary | ICD-10-CM | POA: Diagnosis not present

## 2017-02-12 DIAGNOSIS — D509 Iron deficiency anemia, unspecified: Secondary | ICD-10-CM | POA: Diagnosis not present

## 2017-02-12 DIAGNOSIS — N186 End stage renal disease: Secondary | ICD-10-CM | POA: Diagnosis not present

## 2017-02-12 DIAGNOSIS — Z992 Dependence on renal dialysis: Secondary | ICD-10-CM | POA: Diagnosis not present

## 2017-02-15 DIAGNOSIS — Z992 Dependence on renal dialysis: Secondary | ICD-10-CM | POA: Diagnosis not present

## 2017-02-15 DIAGNOSIS — D509 Iron deficiency anemia, unspecified: Secondary | ICD-10-CM | POA: Diagnosis not present

## 2017-02-15 DIAGNOSIS — D631 Anemia in chronic kidney disease: Secondary | ICD-10-CM | POA: Diagnosis not present

## 2017-02-15 DIAGNOSIS — N186 End stage renal disease: Secondary | ICD-10-CM | POA: Diagnosis not present

## 2017-02-17 DIAGNOSIS — Z992 Dependence on renal dialysis: Secondary | ICD-10-CM | POA: Diagnosis not present

## 2017-02-17 DIAGNOSIS — D509 Iron deficiency anemia, unspecified: Secondary | ICD-10-CM | POA: Diagnosis not present

## 2017-02-17 DIAGNOSIS — N186 End stage renal disease: Secondary | ICD-10-CM | POA: Diagnosis not present

## 2017-02-17 DIAGNOSIS — D631 Anemia in chronic kidney disease: Secondary | ICD-10-CM | POA: Diagnosis not present

## 2017-02-19 DIAGNOSIS — Z992 Dependence on renal dialysis: Secondary | ICD-10-CM | POA: Diagnosis not present

## 2017-02-19 DIAGNOSIS — N186 End stage renal disease: Secondary | ICD-10-CM | POA: Diagnosis not present

## 2017-02-19 DIAGNOSIS — D509 Iron deficiency anemia, unspecified: Secondary | ICD-10-CM | POA: Diagnosis not present

## 2017-02-19 DIAGNOSIS — D631 Anemia in chronic kidney disease: Secondary | ICD-10-CM | POA: Diagnosis not present

## 2017-02-19 DIAGNOSIS — N2581 Secondary hyperparathyroidism of renal origin: Secondary | ICD-10-CM | POA: Diagnosis not present

## 2017-02-22 DIAGNOSIS — D509 Iron deficiency anemia, unspecified: Secondary | ICD-10-CM | POA: Diagnosis not present

## 2017-02-22 DIAGNOSIS — Z992 Dependence on renal dialysis: Secondary | ICD-10-CM | POA: Diagnosis not present

## 2017-02-22 DIAGNOSIS — D631 Anemia in chronic kidney disease: Secondary | ICD-10-CM | POA: Diagnosis not present

## 2017-02-22 DIAGNOSIS — N186 End stage renal disease: Secondary | ICD-10-CM | POA: Diagnosis not present

## 2017-02-22 DIAGNOSIS — N2581 Secondary hyperparathyroidism of renal origin: Secondary | ICD-10-CM | POA: Diagnosis not present

## 2017-02-24 DIAGNOSIS — D509 Iron deficiency anemia, unspecified: Secondary | ICD-10-CM | POA: Diagnosis not present

## 2017-02-24 DIAGNOSIS — N186 End stage renal disease: Secondary | ICD-10-CM | POA: Diagnosis not present

## 2017-02-24 DIAGNOSIS — D631 Anemia in chronic kidney disease: Secondary | ICD-10-CM | POA: Diagnosis not present

## 2017-02-24 DIAGNOSIS — N2581 Secondary hyperparathyroidism of renal origin: Secondary | ICD-10-CM | POA: Diagnosis not present

## 2017-02-24 DIAGNOSIS — Z992 Dependence on renal dialysis: Secondary | ICD-10-CM | POA: Diagnosis not present

## 2017-02-26 DIAGNOSIS — Z992 Dependence on renal dialysis: Secondary | ICD-10-CM | POA: Diagnosis not present

## 2017-02-26 DIAGNOSIS — D509 Iron deficiency anemia, unspecified: Secondary | ICD-10-CM | POA: Diagnosis not present

## 2017-02-26 DIAGNOSIS — N186 End stage renal disease: Secondary | ICD-10-CM | POA: Diagnosis not present

## 2017-02-26 DIAGNOSIS — D631 Anemia in chronic kidney disease: Secondary | ICD-10-CM | POA: Diagnosis not present

## 2017-02-26 DIAGNOSIS — N2581 Secondary hyperparathyroidism of renal origin: Secondary | ICD-10-CM | POA: Diagnosis not present

## 2017-03-01 DIAGNOSIS — D631 Anemia in chronic kidney disease: Secondary | ICD-10-CM | POA: Diagnosis not present

## 2017-03-01 DIAGNOSIS — N186 End stage renal disease: Secondary | ICD-10-CM | POA: Diagnosis not present

## 2017-03-01 DIAGNOSIS — Z992 Dependence on renal dialysis: Secondary | ICD-10-CM | POA: Diagnosis not present

## 2017-03-01 DIAGNOSIS — D509 Iron deficiency anemia, unspecified: Secondary | ICD-10-CM | POA: Diagnosis not present

## 2017-03-01 DIAGNOSIS — N2581 Secondary hyperparathyroidism of renal origin: Secondary | ICD-10-CM | POA: Diagnosis not present

## 2017-03-03 DIAGNOSIS — N2581 Secondary hyperparathyroidism of renal origin: Secondary | ICD-10-CM | POA: Diagnosis not present

## 2017-03-03 DIAGNOSIS — N186 End stage renal disease: Secondary | ICD-10-CM | POA: Diagnosis not present

## 2017-03-03 DIAGNOSIS — D509 Iron deficiency anemia, unspecified: Secondary | ICD-10-CM | POA: Diagnosis not present

## 2017-03-03 DIAGNOSIS — D631 Anemia in chronic kidney disease: Secondary | ICD-10-CM | POA: Diagnosis not present

## 2017-03-03 DIAGNOSIS — Z992 Dependence on renal dialysis: Secondary | ICD-10-CM | POA: Diagnosis not present

## 2017-03-05 DIAGNOSIS — N2581 Secondary hyperparathyroidism of renal origin: Secondary | ICD-10-CM | POA: Diagnosis not present

## 2017-03-05 DIAGNOSIS — Z992 Dependence on renal dialysis: Secondary | ICD-10-CM | POA: Diagnosis not present

## 2017-03-05 DIAGNOSIS — N186 End stage renal disease: Secondary | ICD-10-CM | POA: Diagnosis not present

## 2017-03-05 DIAGNOSIS — D631 Anemia in chronic kidney disease: Secondary | ICD-10-CM | POA: Diagnosis not present

## 2017-03-05 DIAGNOSIS — D509 Iron deficiency anemia, unspecified: Secondary | ICD-10-CM | POA: Diagnosis not present

## 2017-03-08 DIAGNOSIS — D631 Anemia in chronic kidney disease: Secondary | ICD-10-CM | POA: Diagnosis not present

## 2017-03-08 DIAGNOSIS — N2581 Secondary hyperparathyroidism of renal origin: Secondary | ICD-10-CM | POA: Diagnosis not present

## 2017-03-08 DIAGNOSIS — D509 Iron deficiency anemia, unspecified: Secondary | ICD-10-CM | POA: Diagnosis not present

## 2017-03-08 DIAGNOSIS — Z992 Dependence on renal dialysis: Secondary | ICD-10-CM | POA: Diagnosis not present

## 2017-03-08 DIAGNOSIS — N186 End stage renal disease: Secondary | ICD-10-CM | POA: Diagnosis not present

## 2017-03-10 DIAGNOSIS — N2581 Secondary hyperparathyroidism of renal origin: Secondary | ICD-10-CM | POA: Diagnosis not present

## 2017-03-10 DIAGNOSIS — N186 End stage renal disease: Secondary | ICD-10-CM | POA: Diagnosis not present

## 2017-03-10 DIAGNOSIS — D631 Anemia in chronic kidney disease: Secondary | ICD-10-CM | POA: Diagnosis not present

## 2017-03-10 DIAGNOSIS — Z992 Dependence on renal dialysis: Secondary | ICD-10-CM | POA: Diagnosis not present

## 2017-03-10 DIAGNOSIS — D509 Iron deficiency anemia, unspecified: Secondary | ICD-10-CM | POA: Diagnosis not present

## 2017-03-12 DIAGNOSIS — N186 End stage renal disease: Secondary | ICD-10-CM | POA: Diagnosis not present

## 2017-03-12 DIAGNOSIS — D631 Anemia in chronic kidney disease: Secondary | ICD-10-CM | POA: Diagnosis not present

## 2017-03-12 DIAGNOSIS — D509 Iron deficiency anemia, unspecified: Secondary | ICD-10-CM | POA: Diagnosis not present

## 2017-03-12 DIAGNOSIS — Z992 Dependence on renal dialysis: Secondary | ICD-10-CM | POA: Diagnosis not present

## 2017-03-12 DIAGNOSIS — N2581 Secondary hyperparathyroidism of renal origin: Secondary | ICD-10-CM | POA: Diagnosis not present

## 2017-03-15 DIAGNOSIS — N2581 Secondary hyperparathyroidism of renal origin: Secondary | ICD-10-CM | POA: Diagnosis not present

## 2017-03-15 DIAGNOSIS — D631 Anemia in chronic kidney disease: Secondary | ICD-10-CM | POA: Diagnosis not present

## 2017-03-15 DIAGNOSIS — D509 Iron deficiency anemia, unspecified: Secondary | ICD-10-CM | POA: Diagnosis not present

## 2017-03-15 DIAGNOSIS — N186 End stage renal disease: Secondary | ICD-10-CM | POA: Diagnosis not present

## 2017-03-15 DIAGNOSIS — Z992 Dependence on renal dialysis: Secondary | ICD-10-CM | POA: Diagnosis not present

## 2017-03-17 DIAGNOSIS — Z992 Dependence on renal dialysis: Secondary | ICD-10-CM | POA: Diagnosis not present

## 2017-03-17 DIAGNOSIS — D631 Anemia in chronic kidney disease: Secondary | ICD-10-CM | POA: Diagnosis not present

## 2017-03-17 DIAGNOSIS — D509 Iron deficiency anemia, unspecified: Secondary | ICD-10-CM | POA: Diagnosis not present

## 2017-03-17 DIAGNOSIS — N2581 Secondary hyperparathyroidism of renal origin: Secondary | ICD-10-CM | POA: Diagnosis not present

## 2017-03-17 DIAGNOSIS — N186 End stage renal disease: Secondary | ICD-10-CM | POA: Diagnosis not present

## 2017-03-19 DIAGNOSIS — Z992 Dependence on renal dialysis: Secondary | ICD-10-CM | POA: Diagnosis not present

## 2017-03-19 DIAGNOSIS — N186 End stage renal disease: Secondary | ICD-10-CM | POA: Diagnosis not present

## 2017-03-19 DIAGNOSIS — D509 Iron deficiency anemia, unspecified: Secondary | ICD-10-CM | POA: Diagnosis not present

## 2017-03-19 DIAGNOSIS — N2581 Secondary hyperparathyroidism of renal origin: Secondary | ICD-10-CM | POA: Diagnosis not present

## 2017-03-19 DIAGNOSIS — D631 Anemia in chronic kidney disease: Secondary | ICD-10-CM | POA: Diagnosis not present

## 2017-03-20 DIAGNOSIS — Z992 Dependence on renal dialysis: Secondary | ICD-10-CM | POA: Diagnosis not present

## 2017-03-20 DIAGNOSIS — N186 End stage renal disease: Secondary | ICD-10-CM | POA: Diagnosis not present

## 2017-03-22 DIAGNOSIS — N2581 Secondary hyperparathyroidism of renal origin: Secondary | ICD-10-CM | POA: Diagnosis not present

## 2017-03-22 DIAGNOSIS — N186 End stage renal disease: Secondary | ICD-10-CM | POA: Diagnosis not present

## 2017-03-22 DIAGNOSIS — D631 Anemia in chronic kidney disease: Secondary | ICD-10-CM | POA: Diagnosis not present

## 2017-03-22 DIAGNOSIS — D509 Iron deficiency anemia, unspecified: Secondary | ICD-10-CM | POA: Diagnosis not present

## 2017-03-22 DIAGNOSIS — Z992 Dependence on renal dialysis: Secondary | ICD-10-CM | POA: Diagnosis not present

## 2017-03-24 DIAGNOSIS — N2581 Secondary hyperparathyroidism of renal origin: Secondary | ICD-10-CM | POA: Diagnosis not present

## 2017-03-24 DIAGNOSIS — Z992 Dependence on renal dialysis: Secondary | ICD-10-CM | POA: Diagnosis not present

## 2017-03-24 DIAGNOSIS — D631 Anemia in chronic kidney disease: Secondary | ICD-10-CM | POA: Diagnosis not present

## 2017-03-24 DIAGNOSIS — N186 End stage renal disease: Secondary | ICD-10-CM | POA: Diagnosis not present

## 2017-03-24 DIAGNOSIS — D509 Iron deficiency anemia, unspecified: Secondary | ICD-10-CM | POA: Diagnosis not present

## 2017-03-26 DIAGNOSIS — D631 Anemia in chronic kidney disease: Secondary | ICD-10-CM | POA: Diagnosis not present

## 2017-03-26 DIAGNOSIS — N186 End stage renal disease: Secondary | ICD-10-CM | POA: Diagnosis not present

## 2017-03-26 DIAGNOSIS — N2581 Secondary hyperparathyroidism of renal origin: Secondary | ICD-10-CM | POA: Diagnosis not present

## 2017-03-26 DIAGNOSIS — Z992 Dependence on renal dialysis: Secondary | ICD-10-CM | POA: Diagnosis not present

## 2017-03-26 DIAGNOSIS — D509 Iron deficiency anemia, unspecified: Secondary | ICD-10-CM | POA: Diagnosis not present

## 2017-03-29 DIAGNOSIS — N2581 Secondary hyperparathyroidism of renal origin: Secondary | ICD-10-CM | POA: Diagnosis not present

## 2017-03-29 DIAGNOSIS — Z992 Dependence on renal dialysis: Secondary | ICD-10-CM | POA: Diagnosis not present

## 2017-03-29 DIAGNOSIS — D509 Iron deficiency anemia, unspecified: Secondary | ICD-10-CM | POA: Diagnosis not present

## 2017-03-29 DIAGNOSIS — N186 End stage renal disease: Secondary | ICD-10-CM | POA: Diagnosis not present

## 2017-03-29 DIAGNOSIS — D631 Anemia in chronic kidney disease: Secondary | ICD-10-CM | POA: Diagnosis not present

## 2017-03-31 DIAGNOSIS — D509 Iron deficiency anemia, unspecified: Secondary | ICD-10-CM | POA: Diagnosis not present

## 2017-03-31 DIAGNOSIS — D631 Anemia in chronic kidney disease: Secondary | ICD-10-CM | POA: Diagnosis not present

## 2017-03-31 DIAGNOSIS — N2581 Secondary hyperparathyroidism of renal origin: Secondary | ICD-10-CM | POA: Diagnosis not present

## 2017-03-31 DIAGNOSIS — Z992 Dependence on renal dialysis: Secondary | ICD-10-CM | POA: Diagnosis not present

## 2017-03-31 DIAGNOSIS — N186 End stage renal disease: Secondary | ICD-10-CM | POA: Diagnosis not present

## 2017-04-01 DIAGNOSIS — E1165 Type 2 diabetes mellitus with hyperglycemia: Secondary | ICD-10-CM | POA: Diagnosis not present

## 2017-04-01 DIAGNOSIS — Z041 Encounter for examination and observation following transport accident: Secondary | ICD-10-CM | POA: Diagnosis not present

## 2017-04-01 DIAGNOSIS — N186 End stage renal disease: Secondary | ICD-10-CM | POA: Diagnosis not present

## 2017-04-01 DIAGNOSIS — M79661 Pain in right lower leg: Secondary | ICD-10-CM | POA: Diagnosis not present

## 2017-04-01 DIAGNOSIS — E1122 Type 2 diabetes mellitus with diabetic chronic kidney disease: Secondary | ICD-10-CM | POA: Diagnosis not present

## 2017-04-01 DIAGNOSIS — Z794 Long term (current) use of insulin: Secondary | ICD-10-CM | POA: Diagnosis not present

## 2017-04-01 DIAGNOSIS — Z992 Dependence on renal dialysis: Secondary | ICD-10-CM | POA: Diagnosis not present

## 2017-04-01 DIAGNOSIS — M85861 Other specified disorders of bone density and structure, right lower leg: Secondary | ICD-10-CM | POA: Diagnosis not present

## 2017-04-01 DIAGNOSIS — I10 Essential (primary) hypertension: Secondary | ICD-10-CM | POA: Diagnosis not present

## 2017-04-01 DIAGNOSIS — I2510 Atherosclerotic heart disease of native coronary artery without angina pectoris: Secondary | ICD-10-CM | POA: Diagnosis not present

## 2017-04-01 DIAGNOSIS — N185 Chronic kidney disease, stage 5: Secondary | ICD-10-CM | POA: Diagnosis not present

## 2017-04-02 DIAGNOSIS — N186 End stage renal disease: Secondary | ICD-10-CM | POA: Diagnosis not present

## 2017-04-02 DIAGNOSIS — D509 Iron deficiency anemia, unspecified: Secondary | ICD-10-CM | POA: Diagnosis not present

## 2017-04-02 DIAGNOSIS — N2581 Secondary hyperparathyroidism of renal origin: Secondary | ICD-10-CM | POA: Diagnosis not present

## 2017-04-02 DIAGNOSIS — Z992 Dependence on renal dialysis: Secondary | ICD-10-CM | POA: Diagnosis not present

## 2017-04-02 DIAGNOSIS — D631 Anemia in chronic kidney disease: Secondary | ICD-10-CM | POA: Diagnosis not present

## 2017-04-05 DIAGNOSIS — D631 Anemia in chronic kidney disease: Secondary | ICD-10-CM | POA: Diagnosis not present

## 2017-04-05 DIAGNOSIS — N186 End stage renal disease: Secondary | ICD-10-CM | POA: Diagnosis not present

## 2017-04-05 DIAGNOSIS — Z992 Dependence on renal dialysis: Secondary | ICD-10-CM | POA: Diagnosis not present

## 2017-04-05 DIAGNOSIS — N2581 Secondary hyperparathyroidism of renal origin: Secondary | ICD-10-CM | POA: Diagnosis not present

## 2017-04-05 DIAGNOSIS — D509 Iron deficiency anemia, unspecified: Secondary | ICD-10-CM | POA: Diagnosis not present

## 2017-04-07 DIAGNOSIS — N186 End stage renal disease: Secondary | ICD-10-CM | POA: Diagnosis not present

## 2017-04-07 DIAGNOSIS — N2581 Secondary hyperparathyroidism of renal origin: Secondary | ICD-10-CM | POA: Diagnosis not present

## 2017-04-07 DIAGNOSIS — Z992 Dependence on renal dialysis: Secondary | ICD-10-CM | POA: Diagnosis not present

## 2017-04-07 DIAGNOSIS — D509 Iron deficiency anemia, unspecified: Secondary | ICD-10-CM | POA: Diagnosis not present

## 2017-04-07 DIAGNOSIS — D631 Anemia in chronic kidney disease: Secondary | ICD-10-CM | POA: Diagnosis not present

## 2017-04-09 DIAGNOSIS — D631 Anemia in chronic kidney disease: Secondary | ICD-10-CM | POA: Diagnosis not present

## 2017-04-09 DIAGNOSIS — N186 End stage renal disease: Secondary | ICD-10-CM | POA: Diagnosis not present

## 2017-04-09 DIAGNOSIS — Z992 Dependence on renal dialysis: Secondary | ICD-10-CM | POA: Diagnosis not present

## 2017-04-09 DIAGNOSIS — N2581 Secondary hyperparathyroidism of renal origin: Secondary | ICD-10-CM | POA: Diagnosis not present

## 2017-04-09 DIAGNOSIS — D509 Iron deficiency anemia, unspecified: Secondary | ICD-10-CM | POA: Diagnosis not present

## 2017-04-12 DIAGNOSIS — N186 End stage renal disease: Secondary | ICD-10-CM | POA: Diagnosis not present

## 2017-04-12 DIAGNOSIS — N2581 Secondary hyperparathyroidism of renal origin: Secondary | ICD-10-CM | POA: Diagnosis not present

## 2017-04-12 DIAGNOSIS — Z992 Dependence on renal dialysis: Secondary | ICD-10-CM | POA: Diagnosis not present

## 2017-04-12 DIAGNOSIS — D631 Anemia in chronic kidney disease: Secondary | ICD-10-CM | POA: Diagnosis not present

## 2017-04-12 DIAGNOSIS — D509 Iron deficiency anemia, unspecified: Secondary | ICD-10-CM | POA: Diagnosis not present

## 2017-04-14 DIAGNOSIS — D509 Iron deficiency anemia, unspecified: Secondary | ICD-10-CM | POA: Diagnosis not present

## 2017-04-14 DIAGNOSIS — N186 End stage renal disease: Secondary | ICD-10-CM | POA: Diagnosis not present

## 2017-04-14 DIAGNOSIS — Z992 Dependence on renal dialysis: Secondary | ICD-10-CM | POA: Diagnosis not present

## 2017-04-14 DIAGNOSIS — N2581 Secondary hyperparathyroidism of renal origin: Secondary | ICD-10-CM | POA: Diagnosis not present

## 2017-04-14 DIAGNOSIS — D631 Anemia in chronic kidney disease: Secondary | ICD-10-CM | POA: Diagnosis not present

## 2017-04-16 DIAGNOSIS — N186 End stage renal disease: Secondary | ICD-10-CM | POA: Diagnosis not present

## 2017-04-16 DIAGNOSIS — N2581 Secondary hyperparathyroidism of renal origin: Secondary | ICD-10-CM | POA: Diagnosis not present

## 2017-04-16 DIAGNOSIS — D631 Anemia in chronic kidney disease: Secondary | ICD-10-CM | POA: Diagnosis not present

## 2017-04-16 DIAGNOSIS — D509 Iron deficiency anemia, unspecified: Secondary | ICD-10-CM | POA: Diagnosis not present

## 2017-04-16 DIAGNOSIS — Z992 Dependence on renal dialysis: Secondary | ICD-10-CM | POA: Diagnosis not present

## 2017-04-19 DIAGNOSIS — D631 Anemia in chronic kidney disease: Secondary | ICD-10-CM | POA: Diagnosis not present

## 2017-04-19 DIAGNOSIS — N2581 Secondary hyperparathyroidism of renal origin: Secondary | ICD-10-CM | POA: Diagnosis not present

## 2017-04-19 DIAGNOSIS — N186 End stage renal disease: Secondary | ICD-10-CM | POA: Diagnosis not present

## 2017-04-19 DIAGNOSIS — Z992 Dependence on renal dialysis: Secondary | ICD-10-CM | POA: Diagnosis not present

## 2017-04-19 DIAGNOSIS — D509 Iron deficiency anemia, unspecified: Secondary | ICD-10-CM | POA: Diagnosis not present

## 2017-04-21 DIAGNOSIS — Z992 Dependence on renal dialysis: Secondary | ICD-10-CM | POA: Diagnosis not present

## 2017-04-21 DIAGNOSIS — D631 Anemia in chronic kidney disease: Secondary | ICD-10-CM | POA: Diagnosis not present

## 2017-04-21 DIAGNOSIS — N186 End stage renal disease: Secondary | ICD-10-CM | POA: Diagnosis not present

## 2017-04-21 DIAGNOSIS — D509 Iron deficiency anemia, unspecified: Secondary | ICD-10-CM | POA: Diagnosis not present

## 2017-04-23 DIAGNOSIS — N186 End stage renal disease: Secondary | ICD-10-CM | POA: Diagnosis not present

## 2017-04-23 DIAGNOSIS — Z992 Dependence on renal dialysis: Secondary | ICD-10-CM | POA: Diagnosis not present

## 2017-04-23 DIAGNOSIS — D631 Anemia in chronic kidney disease: Secondary | ICD-10-CM | POA: Diagnosis not present

## 2017-04-23 DIAGNOSIS — D509 Iron deficiency anemia, unspecified: Secondary | ICD-10-CM | POA: Diagnosis not present

## 2017-04-26 DIAGNOSIS — N186 End stage renal disease: Secondary | ICD-10-CM | POA: Diagnosis not present

## 2017-04-26 DIAGNOSIS — D509 Iron deficiency anemia, unspecified: Secondary | ICD-10-CM | POA: Diagnosis not present

## 2017-04-26 DIAGNOSIS — Z992 Dependence on renal dialysis: Secondary | ICD-10-CM | POA: Diagnosis not present

## 2017-04-26 DIAGNOSIS — D631 Anemia in chronic kidney disease: Secondary | ICD-10-CM | POA: Diagnosis not present

## 2017-04-28 DIAGNOSIS — D631 Anemia in chronic kidney disease: Secondary | ICD-10-CM | POA: Diagnosis not present

## 2017-04-28 DIAGNOSIS — Z992 Dependence on renal dialysis: Secondary | ICD-10-CM | POA: Diagnosis not present

## 2017-04-28 DIAGNOSIS — N186 End stage renal disease: Secondary | ICD-10-CM | POA: Diagnosis not present

## 2017-04-28 DIAGNOSIS — D509 Iron deficiency anemia, unspecified: Secondary | ICD-10-CM | POA: Diagnosis not present

## 2017-04-30 DIAGNOSIS — D631 Anemia in chronic kidney disease: Secondary | ICD-10-CM | POA: Diagnosis not present

## 2017-04-30 DIAGNOSIS — N186 End stage renal disease: Secondary | ICD-10-CM | POA: Diagnosis not present

## 2017-04-30 DIAGNOSIS — D509 Iron deficiency anemia, unspecified: Secondary | ICD-10-CM | POA: Diagnosis not present

## 2017-04-30 DIAGNOSIS — Z992 Dependence on renal dialysis: Secondary | ICD-10-CM | POA: Diagnosis not present

## 2017-05-03 DIAGNOSIS — D631 Anemia in chronic kidney disease: Secondary | ICD-10-CM | POA: Diagnosis not present

## 2017-05-03 DIAGNOSIS — N186 End stage renal disease: Secondary | ICD-10-CM | POA: Diagnosis not present

## 2017-05-03 DIAGNOSIS — D509 Iron deficiency anemia, unspecified: Secondary | ICD-10-CM | POA: Diagnosis not present

## 2017-05-03 DIAGNOSIS — Z992 Dependence on renal dialysis: Secondary | ICD-10-CM | POA: Diagnosis not present

## 2017-05-05 DIAGNOSIS — N186 End stage renal disease: Secondary | ICD-10-CM | POA: Diagnosis not present

## 2017-05-05 DIAGNOSIS — Z992 Dependence on renal dialysis: Secondary | ICD-10-CM | POA: Diagnosis not present

## 2017-05-05 DIAGNOSIS — D631 Anemia in chronic kidney disease: Secondary | ICD-10-CM | POA: Diagnosis not present

## 2017-05-05 DIAGNOSIS — D509 Iron deficiency anemia, unspecified: Secondary | ICD-10-CM | POA: Diagnosis not present

## 2017-05-07 DIAGNOSIS — D509 Iron deficiency anemia, unspecified: Secondary | ICD-10-CM | POA: Diagnosis not present

## 2017-05-07 DIAGNOSIS — D631 Anemia in chronic kidney disease: Secondary | ICD-10-CM | POA: Diagnosis not present

## 2017-05-07 DIAGNOSIS — N186 End stage renal disease: Secondary | ICD-10-CM | POA: Diagnosis not present

## 2017-05-07 DIAGNOSIS — Z992 Dependence on renal dialysis: Secondary | ICD-10-CM | POA: Diagnosis not present

## 2017-05-10 DIAGNOSIS — N186 End stage renal disease: Secondary | ICD-10-CM | POA: Diagnosis not present

## 2017-05-10 DIAGNOSIS — Z992 Dependence on renal dialysis: Secondary | ICD-10-CM | POA: Diagnosis not present

## 2017-05-10 DIAGNOSIS — D631 Anemia in chronic kidney disease: Secondary | ICD-10-CM | POA: Diagnosis not present

## 2017-05-10 DIAGNOSIS — D509 Iron deficiency anemia, unspecified: Secondary | ICD-10-CM | POA: Diagnosis not present

## 2017-05-12 DIAGNOSIS — Z992 Dependence on renal dialysis: Secondary | ICD-10-CM | POA: Diagnosis not present

## 2017-05-12 DIAGNOSIS — N186 End stage renal disease: Secondary | ICD-10-CM | POA: Diagnosis not present

## 2017-05-12 DIAGNOSIS — D509 Iron deficiency anemia, unspecified: Secondary | ICD-10-CM | POA: Diagnosis not present

## 2017-05-12 DIAGNOSIS — D631 Anemia in chronic kidney disease: Secondary | ICD-10-CM | POA: Diagnosis not present

## 2017-05-14 DIAGNOSIS — D509 Iron deficiency anemia, unspecified: Secondary | ICD-10-CM | POA: Diagnosis not present

## 2017-05-14 DIAGNOSIS — N186 End stage renal disease: Secondary | ICD-10-CM | POA: Diagnosis not present

## 2017-05-14 DIAGNOSIS — D631 Anemia in chronic kidney disease: Secondary | ICD-10-CM | POA: Diagnosis not present

## 2017-05-14 DIAGNOSIS — Z992 Dependence on renal dialysis: Secondary | ICD-10-CM | POA: Diagnosis not present

## 2017-05-17 DIAGNOSIS — D631 Anemia in chronic kidney disease: Secondary | ICD-10-CM | POA: Diagnosis not present

## 2017-05-17 DIAGNOSIS — D509 Iron deficiency anemia, unspecified: Secondary | ICD-10-CM | POA: Diagnosis not present

## 2017-05-17 DIAGNOSIS — Z992 Dependence on renal dialysis: Secondary | ICD-10-CM | POA: Diagnosis not present

## 2017-05-17 DIAGNOSIS — N186 End stage renal disease: Secondary | ICD-10-CM | POA: Diagnosis not present

## 2017-05-19 DIAGNOSIS — N186 End stage renal disease: Secondary | ICD-10-CM | POA: Diagnosis not present

## 2017-05-19 DIAGNOSIS — D509 Iron deficiency anemia, unspecified: Secondary | ICD-10-CM | POA: Diagnosis not present

## 2017-05-19 DIAGNOSIS — Z992 Dependence on renal dialysis: Secondary | ICD-10-CM | POA: Diagnosis not present

## 2017-05-19 DIAGNOSIS — D631 Anemia in chronic kidney disease: Secondary | ICD-10-CM | POA: Diagnosis not present

## 2017-05-20 DIAGNOSIS — N186 End stage renal disease: Secondary | ICD-10-CM | POA: Diagnosis not present

## 2017-05-20 DIAGNOSIS — Z992 Dependence on renal dialysis: Secondary | ICD-10-CM | POA: Diagnosis not present

## 2017-05-21 DIAGNOSIS — Z992 Dependence on renal dialysis: Secondary | ICD-10-CM | POA: Diagnosis not present

## 2017-05-21 DIAGNOSIS — D509 Iron deficiency anemia, unspecified: Secondary | ICD-10-CM | POA: Diagnosis not present

## 2017-05-21 DIAGNOSIS — D631 Anemia in chronic kidney disease: Secondary | ICD-10-CM | POA: Diagnosis not present

## 2017-05-21 DIAGNOSIS — N186 End stage renal disease: Secondary | ICD-10-CM | POA: Diagnosis not present

## 2017-05-24 DIAGNOSIS — D631 Anemia in chronic kidney disease: Secondary | ICD-10-CM | POA: Diagnosis not present

## 2017-05-24 DIAGNOSIS — N186 End stage renal disease: Secondary | ICD-10-CM | POA: Diagnosis not present

## 2017-05-24 DIAGNOSIS — D509 Iron deficiency anemia, unspecified: Secondary | ICD-10-CM | POA: Diagnosis not present

## 2017-05-24 DIAGNOSIS — Z992 Dependence on renal dialysis: Secondary | ICD-10-CM | POA: Diagnosis not present

## 2017-05-26 DIAGNOSIS — Z992 Dependence on renal dialysis: Secondary | ICD-10-CM | POA: Diagnosis not present

## 2017-05-26 DIAGNOSIS — N186 End stage renal disease: Secondary | ICD-10-CM | POA: Diagnosis not present

## 2017-05-26 DIAGNOSIS — D509 Iron deficiency anemia, unspecified: Secondary | ICD-10-CM | POA: Diagnosis not present

## 2017-05-26 DIAGNOSIS — D631 Anemia in chronic kidney disease: Secondary | ICD-10-CM | POA: Diagnosis not present

## 2017-05-28 DIAGNOSIS — N186 End stage renal disease: Secondary | ICD-10-CM | POA: Diagnosis not present

## 2017-05-28 DIAGNOSIS — Z992 Dependence on renal dialysis: Secondary | ICD-10-CM | POA: Diagnosis not present

## 2017-05-28 DIAGNOSIS — D631 Anemia in chronic kidney disease: Secondary | ICD-10-CM | POA: Diagnosis not present

## 2017-05-28 DIAGNOSIS — D509 Iron deficiency anemia, unspecified: Secondary | ICD-10-CM | POA: Diagnosis not present

## 2017-05-31 DIAGNOSIS — Z992 Dependence on renal dialysis: Secondary | ICD-10-CM | POA: Diagnosis not present

## 2017-05-31 DIAGNOSIS — N186 End stage renal disease: Secondary | ICD-10-CM | POA: Diagnosis not present

## 2017-05-31 DIAGNOSIS — D509 Iron deficiency anemia, unspecified: Secondary | ICD-10-CM | POA: Diagnosis not present

## 2017-05-31 DIAGNOSIS — D631 Anemia in chronic kidney disease: Secondary | ICD-10-CM | POA: Diagnosis not present

## 2017-06-02 DIAGNOSIS — D509 Iron deficiency anemia, unspecified: Secondary | ICD-10-CM | POA: Diagnosis not present

## 2017-06-02 DIAGNOSIS — Z992 Dependence on renal dialysis: Secondary | ICD-10-CM | POA: Diagnosis not present

## 2017-06-02 DIAGNOSIS — D631 Anemia in chronic kidney disease: Secondary | ICD-10-CM | POA: Diagnosis not present

## 2017-06-02 DIAGNOSIS — N186 End stage renal disease: Secondary | ICD-10-CM | POA: Diagnosis not present

## 2017-06-04 DIAGNOSIS — Z992 Dependence on renal dialysis: Secondary | ICD-10-CM | POA: Diagnosis not present

## 2017-06-04 DIAGNOSIS — D509 Iron deficiency anemia, unspecified: Secondary | ICD-10-CM | POA: Diagnosis not present

## 2017-06-04 DIAGNOSIS — N186 End stage renal disease: Secondary | ICD-10-CM | POA: Diagnosis not present

## 2017-06-04 DIAGNOSIS — D631 Anemia in chronic kidney disease: Secondary | ICD-10-CM | POA: Diagnosis not present

## 2017-06-07 DIAGNOSIS — Z992 Dependence on renal dialysis: Secondary | ICD-10-CM | POA: Diagnosis not present

## 2017-06-07 DIAGNOSIS — D509 Iron deficiency anemia, unspecified: Secondary | ICD-10-CM | POA: Diagnosis not present

## 2017-06-07 DIAGNOSIS — N186 End stage renal disease: Secondary | ICD-10-CM | POA: Diagnosis not present

## 2017-06-07 DIAGNOSIS — D631 Anemia in chronic kidney disease: Secondary | ICD-10-CM | POA: Diagnosis not present

## 2017-06-09 DIAGNOSIS — N186 End stage renal disease: Secondary | ICD-10-CM | POA: Diagnosis not present

## 2017-06-09 DIAGNOSIS — D509 Iron deficiency anemia, unspecified: Secondary | ICD-10-CM | POA: Diagnosis not present

## 2017-06-09 DIAGNOSIS — D631 Anemia in chronic kidney disease: Secondary | ICD-10-CM | POA: Diagnosis not present

## 2017-06-09 DIAGNOSIS — Z992 Dependence on renal dialysis: Secondary | ICD-10-CM | POA: Diagnosis not present

## 2017-06-11 DIAGNOSIS — Z992 Dependence on renal dialysis: Secondary | ICD-10-CM | POA: Diagnosis not present

## 2017-06-11 DIAGNOSIS — D631 Anemia in chronic kidney disease: Secondary | ICD-10-CM | POA: Diagnosis not present

## 2017-06-11 DIAGNOSIS — D509 Iron deficiency anemia, unspecified: Secondary | ICD-10-CM | POA: Diagnosis not present

## 2017-06-11 DIAGNOSIS — N186 End stage renal disease: Secondary | ICD-10-CM | POA: Diagnosis not present

## 2017-06-14 DIAGNOSIS — D509 Iron deficiency anemia, unspecified: Secondary | ICD-10-CM | POA: Diagnosis not present

## 2017-06-14 DIAGNOSIS — Z992 Dependence on renal dialysis: Secondary | ICD-10-CM | POA: Diagnosis not present

## 2017-06-14 DIAGNOSIS — D631 Anemia in chronic kidney disease: Secondary | ICD-10-CM | POA: Diagnosis not present

## 2017-06-14 DIAGNOSIS — N186 End stage renal disease: Secondary | ICD-10-CM | POA: Diagnosis not present

## 2017-06-16 DIAGNOSIS — D509 Iron deficiency anemia, unspecified: Secondary | ICD-10-CM | POA: Diagnosis not present

## 2017-06-16 DIAGNOSIS — D631 Anemia in chronic kidney disease: Secondary | ICD-10-CM | POA: Diagnosis not present

## 2017-06-16 DIAGNOSIS — N186 End stage renal disease: Secondary | ICD-10-CM | POA: Diagnosis not present

## 2017-06-16 DIAGNOSIS — Z992 Dependence on renal dialysis: Secondary | ICD-10-CM | POA: Diagnosis not present

## 2017-06-18 DIAGNOSIS — D631 Anemia in chronic kidney disease: Secondary | ICD-10-CM | POA: Diagnosis not present

## 2017-06-18 DIAGNOSIS — Z992 Dependence on renal dialysis: Secondary | ICD-10-CM | POA: Diagnosis not present

## 2017-06-18 DIAGNOSIS — D509 Iron deficiency anemia, unspecified: Secondary | ICD-10-CM | POA: Diagnosis not present

## 2017-06-18 DIAGNOSIS — N186 End stage renal disease: Secondary | ICD-10-CM | POA: Diagnosis not present

## 2017-06-19 DIAGNOSIS — N186 End stage renal disease: Secondary | ICD-10-CM | POA: Diagnosis not present

## 2017-06-19 DIAGNOSIS — Z992 Dependence on renal dialysis: Secondary | ICD-10-CM | POA: Diagnosis not present

## 2017-06-21 DIAGNOSIS — D631 Anemia in chronic kidney disease: Secondary | ICD-10-CM | POA: Diagnosis not present

## 2017-06-21 DIAGNOSIS — N2581 Secondary hyperparathyroidism of renal origin: Secondary | ICD-10-CM | POA: Diagnosis not present

## 2017-06-21 DIAGNOSIS — D509 Iron deficiency anemia, unspecified: Secondary | ICD-10-CM | POA: Diagnosis not present

## 2017-06-21 DIAGNOSIS — N186 End stage renal disease: Secondary | ICD-10-CM | POA: Diagnosis not present

## 2017-06-21 DIAGNOSIS — Z992 Dependence on renal dialysis: Secondary | ICD-10-CM | POA: Diagnosis not present

## 2017-06-23 DIAGNOSIS — N186 End stage renal disease: Secondary | ICD-10-CM | POA: Diagnosis not present

## 2017-06-23 DIAGNOSIS — D631 Anemia in chronic kidney disease: Secondary | ICD-10-CM | POA: Diagnosis not present

## 2017-06-23 DIAGNOSIS — D509 Iron deficiency anemia, unspecified: Secondary | ICD-10-CM | POA: Diagnosis not present

## 2017-06-23 DIAGNOSIS — Z992 Dependence on renal dialysis: Secondary | ICD-10-CM | POA: Diagnosis not present

## 2017-06-23 DIAGNOSIS — N2581 Secondary hyperparathyroidism of renal origin: Secondary | ICD-10-CM | POA: Diagnosis not present

## 2017-06-25 DIAGNOSIS — D509 Iron deficiency anemia, unspecified: Secondary | ICD-10-CM | POA: Diagnosis not present

## 2017-06-25 DIAGNOSIS — D631 Anemia in chronic kidney disease: Secondary | ICD-10-CM | POA: Diagnosis not present

## 2017-06-25 DIAGNOSIS — Z992 Dependence on renal dialysis: Secondary | ICD-10-CM | POA: Diagnosis not present

## 2017-06-25 DIAGNOSIS — N186 End stage renal disease: Secondary | ICD-10-CM | POA: Diagnosis not present

## 2017-06-25 DIAGNOSIS — N2581 Secondary hyperparathyroidism of renal origin: Secondary | ICD-10-CM | POA: Diagnosis not present

## 2017-06-28 DIAGNOSIS — N186 End stage renal disease: Secondary | ICD-10-CM | POA: Diagnosis not present

## 2017-06-28 DIAGNOSIS — N2581 Secondary hyperparathyroidism of renal origin: Secondary | ICD-10-CM | POA: Diagnosis not present

## 2017-06-28 DIAGNOSIS — D509 Iron deficiency anemia, unspecified: Secondary | ICD-10-CM | POA: Diagnosis not present

## 2017-06-28 DIAGNOSIS — D631 Anemia in chronic kidney disease: Secondary | ICD-10-CM | POA: Diagnosis not present

## 2017-06-28 DIAGNOSIS — Z992 Dependence on renal dialysis: Secondary | ICD-10-CM | POA: Diagnosis not present

## 2017-06-30 DIAGNOSIS — D509 Iron deficiency anemia, unspecified: Secondary | ICD-10-CM | POA: Diagnosis not present

## 2017-06-30 DIAGNOSIS — D631 Anemia in chronic kidney disease: Secondary | ICD-10-CM | POA: Diagnosis not present

## 2017-06-30 DIAGNOSIS — Z992 Dependence on renal dialysis: Secondary | ICD-10-CM | POA: Diagnosis not present

## 2017-06-30 DIAGNOSIS — N2581 Secondary hyperparathyroidism of renal origin: Secondary | ICD-10-CM | POA: Diagnosis not present

## 2017-06-30 DIAGNOSIS — N186 End stage renal disease: Secondary | ICD-10-CM | POA: Diagnosis not present

## 2017-07-02 DIAGNOSIS — D509 Iron deficiency anemia, unspecified: Secondary | ICD-10-CM | POA: Diagnosis not present

## 2017-07-02 DIAGNOSIS — N2581 Secondary hyperparathyroidism of renal origin: Secondary | ICD-10-CM | POA: Diagnosis not present

## 2017-07-02 DIAGNOSIS — D631 Anemia in chronic kidney disease: Secondary | ICD-10-CM | POA: Diagnosis not present

## 2017-07-02 DIAGNOSIS — N186 End stage renal disease: Secondary | ICD-10-CM | POA: Diagnosis not present

## 2017-07-02 DIAGNOSIS — Z992 Dependence on renal dialysis: Secondary | ICD-10-CM | POA: Diagnosis not present

## 2017-07-05 DIAGNOSIS — D509 Iron deficiency anemia, unspecified: Secondary | ICD-10-CM | POA: Diagnosis not present

## 2017-07-05 DIAGNOSIS — D631 Anemia in chronic kidney disease: Secondary | ICD-10-CM | POA: Diagnosis not present

## 2017-07-05 DIAGNOSIS — Z992 Dependence on renal dialysis: Secondary | ICD-10-CM | POA: Diagnosis not present

## 2017-07-05 DIAGNOSIS — N186 End stage renal disease: Secondary | ICD-10-CM | POA: Diagnosis not present

## 2017-07-05 DIAGNOSIS — N2581 Secondary hyperparathyroidism of renal origin: Secondary | ICD-10-CM | POA: Diagnosis not present

## 2017-07-07 DIAGNOSIS — N186 End stage renal disease: Secondary | ICD-10-CM | POA: Diagnosis not present

## 2017-07-07 DIAGNOSIS — Z992 Dependence on renal dialysis: Secondary | ICD-10-CM | POA: Diagnosis not present

## 2017-07-07 DIAGNOSIS — N2581 Secondary hyperparathyroidism of renal origin: Secondary | ICD-10-CM | POA: Diagnosis not present

## 2017-07-07 DIAGNOSIS — D509 Iron deficiency anemia, unspecified: Secondary | ICD-10-CM | POA: Diagnosis not present

## 2017-07-07 DIAGNOSIS — D631 Anemia in chronic kidney disease: Secondary | ICD-10-CM | POA: Diagnosis not present

## 2017-07-09 DIAGNOSIS — D631 Anemia in chronic kidney disease: Secondary | ICD-10-CM | POA: Diagnosis not present

## 2017-07-09 DIAGNOSIS — D509 Iron deficiency anemia, unspecified: Secondary | ICD-10-CM | POA: Diagnosis not present

## 2017-07-09 DIAGNOSIS — Z992 Dependence on renal dialysis: Secondary | ICD-10-CM | POA: Diagnosis not present

## 2017-07-09 DIAGNOSIS — N186 End stage renal disease: Secondary | ICD-10-CM | POA: Diagnosis not present

## 2017-07-09 DIAGNOSIS — N2581 Secondary hyperparathyroidism of renal origin: Secondary | ICD-10-CM | POA: Diagnosis not present

## 2017-07-12 DIAGNOSIS — Z992 Dependence on renal dialysis: Secondary | ICD-10-CM | POA: Diagnosis not present

## 2017-07-12 DIAGNOSIS — N2581 Secondary hyperparathyroidism of renal origin: Secondary | ICD-10-CM | POA: Diagnosis not present

## 2017-07-12 DIAGNOSIS — D631 Anemia in chronic kidney disease: Secondary | ICD-10-CM | POA: Diagnosis not present

## 2017-07-12 DIAGNOSIS — N186 End stage renal disease: Secondary | ICD-10-CM | POA: Diagnosis not present

## 2017-07-12 DIAGNOSIS — D509 Iron deficiency anemia, unspecified: Secondary | ICD-10-CM | POA: Diagnosis not present

## 2017-07-14 DIAGNOSIS — N186 End stage renal disease: Secondary | ICD-10-CM | POA: Diagnosis not present

## 2017-07-14 DIAGNOSIS — Z992 Dependence on renal dialysis: Secondary | ICD-10-CM | POA: Diagnosis not present

## 2017-07-14 DIAGNOSIS — N2581 Secondary hyperparathyroidism of renal origin: Secondary | ICD-10-CM | POA: Diagnosis not present

## 2017-07-14 DIAGNOSIS — D509 Iron deficiency anemia, unspecified: Secondary | ICD-10-CM | POA: Diagnosis not present

## 2017-07-14 DIAGNOSIS — D631 Anemia in chronic kidney disease: Secondary | ICD-10-CM | POA: Diagnosis not present

## 2017-07-16 DIAGNOSIS — N186 End stage renal disease: Secondary | ICD-10-CM | POA: Diagnosis not present

## 2017-07-16 DIAGNOSIS — D631 Anemia in chronic kidney disease: Secondary | ICD-10-CM | POA: Diagnosis not present

## 2017-07-16 DIAGNOSIS — Z992 Dependence on renal dialysis: Secondary | ICD-10-CM | POA: Diagnosis not present

## 2017-07-16 DIAGNOSIS — D509 Iron deficiency anemia, unspecified: Secondary | ICD-10-CM | POA: Diagnosis not present

## 2017-07-16 DIAGNOSIS — N2581 Secondary hyperparathyroidism of renal origin: Secondary | ICD-10-CM | POA: Diagnosis not present

## 2017-07-19 DIAGNOSIS — D509 Iron deficiency anemia, unspecified: Secondary | ICD-10-CM | POA: Diagnosis not present

## 2017-07-19 DIAGNOSIS — N186 End stage renal disease: Secondary | ICD-10-CM | POA: Diagnosis not present

## 2017-07-19 DIAGNOSIS — N2581 Secondary hyperparathyroidism of renal origin: Secondary | ICD-10-CM | POA: Diagnosis not present

## 2017-07-19 DIAGNOSIS — D631 Anemia in chronic kidney disease: Secondary | ICD-10-CM | POA: Diagnosis not present

## 2017-07-19 DIAGNOSIS — Z992 Dependence on renal dialysis: Secondary | ICD-10-CM | POA: Diagnosis not present

## 2017-07-20 DIAGNOSIS — Z992 Dependence on renal dialysis: Secondary | ICD-10-CM | POA: Diagnosis not present

## 2017-07-20 DIAGNOSIS — N186 End stage renal disease: Secondary | ICD-10-CM | POA: Diagnosis not present

## 2017-07-21 DIAGNOSIS — D631 Anemia in chronic kidney disease: Secondary | ICD-10-CM | POA: Diagnosis not present

## 2017-07-21 DIAGNOSIS — Z992 Dependence on renal dialysis: Secondary | ICD-10-CM | POA: Diagnosis not present

## 2017-07-21 DIAGNOSIS — N2581 Secondary hyperparathyroidism of renal origin: Secondary | ICD-10-CM | POA: Diagnosis not present

## 2017-07-21 DIAGNOSIS — N186 End stage renal disease: Secondary | ICD-10-CM | POA: Diagnosis not present

## 2017-07-21 DIAGNOSIS — D509 Iron deficiency anemia, unspecified: Secondary | ICD-10-CM | POA: Diagnosis not present

## 2017-07-23 DIAGNOSIS — N2581 Secondary hyperparathyroidism of renal origin: Secondary | ICD-10-CM | POA: Diagnosis not present

## 2017-07-23 DIAGNOSIS — D509 Iron deficiency anemia, unspecified: Secondary | ICD-10-CM | POA: Diagnosis not present

## 2017-07-23 DIAGNOSIS — Z992 Dependence on renal dialysis: Secondary | ICD-10-CM | POA: Diagnosis not present

## 2017-07-23 DIAGNOSIS — N186 End stage renal disease: Secondary | ICD-10-CM | POA: Diagnosis not present

## 2017-07-23 DIAGNOSIS — D631 Anemia in chronic kidney disease: Secondary | ICD-10-CM | POA: Diagnosis not present

## 2017-07-26 DIAGNOSIS — D509 Iron deficiency anemia, unspecified: Secondary | ICD-10-CM | POA: Diagnosis not present

## 2017-07-26 DIAGNOSIS — D631 Anemia in chronic kidney disease: Secondary | ICD-10-CM | POA: Diagnosis not present

## 2017-07-26 DIAGNOSIS — N186 End stage renal disease: Secondary | ICD-10-CM | POA: Diagnosis not present

## 2017-07-26 DIAGNOSIS — N2581 Secondary hyperparathyroidism of renal origin: Secondary | ICD-10-CM | POA: Diagnosis not present

## 2017-07-26 DIAGNOSIS — Z992 Dependence on renal dialysis: Secondary | ICD-10-CM | POA: Diagnosis not present

## 2017-07-28 DIAGNOSIS — Z992 Dependence on renal dialysis: Secondary | ICD-10-CM | POA: Diagnosis not present

## 2017-07-28 DIAGNOSIS — D631 Anemia in chronic kidney disease: Secondary | ICD-10-CM | POA: Diagnosis not present

## 2017-07-28 DIAGNOSIS — N186 End stage renal disease: Secondary | ICD-10-CM | POA: Diagnosis not present

## 2017-07-28 DIAGNOSIS — D509 Iron deficiency anemia, unspecified: Secondary | ICD-10-CM | POA: Diagnosis not present

## 2017-07-28 DIAGNOSIS — N2581 Secondary hyperparathyroidism of renal origin: Secondary | ICD-10-CM | POA: Diagnosis not present

## 2017-07-29 DIAGNOSIS — Z992 Dependence on renal dialysis: Secondary | ICD-10-CM | POA: Diagnosis not present

## 2017-07-29 DIAGNOSIS — I5022 Chronic systolic (congestive) heart failure: Secondary | ICD-10-CM | POA: Diagnosis not present

## 2017-07-29 DIAGNOSIS — I2510 Atherosclerotic heart disease of native coronary artery without angina pectoris: Secondary | ICD-10-CM | POA: Diagnosis not present

## 2017-07-29 DIAGNOSIS — N186 End stage renal disease: Secondary | ICD-10-CM | POA: Diagnosis not present

## 2017-07-30 DIAGNOSIS — D509 Iron deficiency anemia, unspecified: Secondary | ICD-10-CM | POA: Diagnosis not present

## 2017-07-30 DIAGNOSIS — N186 End stage renal disease: Secondary | ICD-10-CM | POA: Diagnosis not present

## 2017-07-30 DIAGNOSIS — D631 Anemia in chronic kidney disease: Secondary | ICD-10-CM | POA: Diagnosis not present

## 2017-07-30 DIAGNOSIS — N2581 Secondary hyperparathyroidism of renal origin: Secondary | ICD-10-CM | POA: Diagnosis not present

## 2017-07-30 DIAGNOSIS — Z992 Dependence on renal dialysis: Secondary | ICD-10-CM | POA: Diagnosis not present

## 2017-08-02 DIAGNOSIS — Z992 Dependence on renal dialysis: Secondary | ICD-10-CM | POA: Diagnosis not present

## 2017-08-02 DIAGNOSIS — D631 Anemia in chronic kidney disease: Secondary | ICD-10-CM | POA: Diagnosis not present

## 2017-08-02 DIAGNOSIS — D509 Iron deficiency anemia, unspecified: Secondary | ICD-10-CM | POA: Diagnosis not present

## 2017-08-02 DIAGNOSIS — N2581 Secondary hyperparathyroidism of renal origin: Secondary | ICD-10-CM | POA: Diagnosis not present

## 2017-08-02 DIAGNOSIS — N186 End stage renal disease: Secondary | ICD-10-CM | POA: Diagnosis not present

## 2017-08-04 DIAGNOSIS — D631 Anemia in chronic kidney disease: Secondary | ICD-10-CM | POA: Diagnosis not present

## 2017-08-04 DIAGNOSIS — N2581 Secondary hyperparathyroidism of renal origin: Secondary | ICD-10-CM | POA: Diagnosis not present

## 2017-08-04 DIAGNOSIS — Z992 Dependence on renal dialysis: Secondary | ICD-10-CM | POA: Diagnosis not present

## 2017-08-04 DIAGNOSIS — N186 End stage renal disease: Secondary | ICD-10-CM | POA: Diagnosis not present

## 2017-08-04 DIAGNOSIS — D509 Iron deficiency anemia, unspecified: Secondary | ICD-10-CM | POA: Diagnosis not present

## 2017-08-06 DIAGNOSIS — N186 End stage renal disease: Secondary | ICD-10-CM | POA: Diagnosis not present

## 2017-08-06 DIAGNOSIS — D631 Anemia in chronic kidney disease: Secondary | ICD-10-CM | POA: Diagnosis not present

## 2017-08-06 DIAGNOSIS — D509 Iron deficiency anemia, unspecified: Secondary | ICD-10-CM | POA: Diagnosis not present

## 2017-08-06 DIAGNOSIS — N2581 Secondary hyperparathyroidism of renal origin: Secondary | ICD-10-CM | POA: Diagnosis not present

## 2017-08-06 DIAGNOSIS — Z992 Dependence on renal dialysis: Secondary | ICD-10-CM | POA: Diagnosis not present

## 2017-08-09 DIAGNOSIS — D631 Anemia in chronic kidney disease: Secondary | ICD-10-CM | POA: Diagnosis not present

## 2017-08-09 DIAGNOSIS — Z992 Dependence on renal dialysis: Secondary | ICD-10-CM | POA: Diagnosis not present

## 2017-08-09 DIAGNOSIS — N2581 Secondary hyperparathyroidism of renal origin: Secondary | ICD-10-CM | POA: Diagnosis not present

## 2017-08-09 DIAGNOSIS — N186 End stage renal disease: Secondary | ICD-10-CM | POA: Diagnosis not present

## 2017-08-09 DIAGNOSIS — D509 Iron deficiency anemia, unspecified: Secondary | ICD-10-CM | POA: Diagnosis not present

## 2017-08-11 DIAGNOSIS — Z992 Dependence on renal dialysis: Secondary | ICD-10-CM | POA: Diagnosis not present

## 2017-08-11 DIAGNOSIS — D631 Anemia in chronic kidney disease: Secondary | ICD-10-CM | POA: Diagnosis not present

## 2017-08-11 DIAGNOSIS — N186 End stage renal disease: Secondary | ICD-10-CM | POA: Diagnosis not present

## 2017-08-11 DIAGNOSIS — D509 Iron deficiency anemia, unspecified: Secondary | ICD-10-CM | POA: Diagnosis not present

## 2017-08-11 DIAGNOSIS — N2581 Secondary hyperparathyroidism of renal origin: Secondary | ICD-10-CM | POA: Diagnosis not present

## 2017-08-13 DIAGNOSIS — D509 Iron deficiency anemia, unspecified: Secondary | ICD-10-CM | POA: Diagnosis not present

## 2017-08-13 DIAGNOSIS — N2581 Secondary hyperparathyroidism of renal origin: Secondary | ICD-10-CM | POA: Diagnosis not present

## 2017-08-13 DIAGNOSIS — Z992 Dependence on renal dialysis: Secondary | ICD-10-CM | POA: Diagnosis not present

## 2017-08-13 DIAGNOSIS — D631 Anemia in chronic kidney disease: Secondary | ICD-10-CM | POA: Diagnosis not present

## 2017-08-13 DIAGNOSIS — N186 End stage renal disease: Secondary | ICD-10-CM | POA: Diagnosis not present

## 2017-08-16 DIAGNOSIS — Z992 Dependence on renal dialysis: Secondary | ICD-10-CM | POA: Diagnosis not present

## 2017-08-16 DIAGNOSIS — D631 Anemia in chronic kidney disease: Secondary | ICD-10-CM | POA: Diagnosis not present

## 2017-08-16 DIAGNOSIS — N186 End stage renal disease: Secondary | ICD-10-CM | POA: Diagnosis not present

## 2017-08-16 DIAGNOSIS — D509 Iron deficiency anemia, unspecified: Secondary | ICD-10-CM | POA: Diagnosis not present

## 2017-08-16 DIAGNOSIS — N2581 Secondary hyperparathyroidism of renal origin: Secondary | ICD-10-CM | POA: Diagnosis not present

## 2017-08-18 DIAGNOSIS — Z992 Dependence on renal dialysis: Secondary | ICD-10-CM | POA: Diagnosis not present

## 2017-08-18 DIAGNOSIS — N186 End stage renal disease: Secondary | ICD-10-CM | POA: Diagnosis not present

## 2017-08-18 DIAGNOSIS — D509 Iron deficiency anemia, unspecified: Secondary | ICD-10-CM | POA: Diagnosis not present

## 2017-08-18 DIAGNOSIS — N2581 Secondary hyperparathyroidism of renal origin: Secondary | ICD-10-CM | POA: Diagnosis not present

## 2017-08-18 DIAGNOSIS — D631 Anemia in chronic kidney disease: Secondary | ICD-10-CM | POA: Diagnosis not present

## 2017-08-20 DIAGNOSIS — D631 Anemia in chronic kidney disease: Secondary | ICD-10-CM | POA: Diagnosis not present

## 2017-08-20 DIAGNOSIS — N2581 Secondary hyperparathyroidism of renal origin: Secondary | ICD-10-CM | POA: Diagnosis not present

## 2017-08-20 DIAGNOSIS — Z992 Dependence on renal dialysis: Secondary | ICD-10-CM | POA: Diagnosis not present

## 2017-08-20 DIAGNOSIS — N186 End stage renal disease: Secondary | ICD-10-CM | POA: Diagnosis not present

## 2017-08-20 DIAGNOSIS — D509 Iron deficiency anemia, unspecified: Secondary | ICD-10-CM | POA: Diagnosis not present

## 2017-08-23 DIAGNOSIS — N186 End stage renal disease: Secondary | ICD-10-CM | POA: Diagnosis not present

## 2017-08-23 DIAGNOSIS — Z992 Dependence on renal dialysis: Secondary | ICD-10-CM | POA: Diagnosis not present

## 2017-08-23 DIAGNOSIS — D631 Anemia in chronic kidney disease: Secondary | ICD-10-CM | POA: Diagnosis not present

## 2017-08-23 DIAGNOSIS — N2581 Secondary hyperparathyroidism of renal origin: Secondary | ICD-10-CM | POA: Diagnosis not present

## 2017-08-23 DIAGNOSIS — D509 Iron deficiency anemia, unspecified: Secondary | ICD-10-CM | POA: Diagnosis not present

## 2017-08-25 DIAGNOSIS — Z992 Dependence on renal dialysis: Secondary | ICD-10-CM | POA: Diagnosis not present

## 2017-08-25 DIAGNOSIS — N186 End stage renal disease: Secondary | ICD-10-CM | POA: Diagnosis not present

## 2017-08-25 DIAGNOSIS — D509 Iron deficiency anemia, unspecified: Secondary | ICD-10-CM | POA: Diagnosis not present

## 2017-08-25 DIAGNOSIS — D631 Anemia in chronic kidney disease: Secondary | ICD-10-CM | POA: Diagnosis not present

## 2017-08-25 DIAGNOSIS — N2581 Secondary hyperparathyroidism of renal origin: Secondary | ICD-10-CM | POA: Diagnosis not present

## 2017-08-27 DIAGNOSIS — D631 Anemia in chronic kidney disease: Secondary | ICD-10-CM | POA: Diagnosis not present

## 2017-08-27 DIAGNOSIS — Z992 Dependence on renal dialysis: Secondary | ICD-10-CM | POA: Diagnosis not present

## 2017-08-27 DIAGNOSIS — N2581 Secondary hyperparathyroidism of renal origin: Secondary | ICD-10-CM | POA: Diagnosis not present

## 2017-08-27 DIAGNOSIS — N186 End stage renal disease: Secondary | ICD-10-CM | POA: Diagnosis not present

## 2017-08-27 DIAGNOSIS — D509 Iron deficiency anemia, unspecified: Secondary | ICD-10-CM | POA: Diagnosis not present

## 2017-08-30 DIAGNOSIS — D631 Anemia in chronic kidney disease: Secondary | ICD-10-CM | POA: Diagnosis not present

## 2017-08-30 DIAGNOSIS — N2581 Secondary hyperparathyroidism of renal origin: Secondary | ICD-10-CM | POA: Diagnosis not present

## 2017-08-30 DIAGNOSIS — D509 Iron deficiency anemia, unspecified: Secondary | ICD-10-CM | POA: Diagnosis not present

## 2017-08-30 DIAGNOSIS — Z992 Dependence on renal dialysis: Secondary | ICD-10-CM | POA: Diagnosis not present

## 2017-08-30 DIAGNOSIS — N186 End stage renal disease: Secondary | ICD-10-CM | POA: Diagnosis not present

## 2017-09-01 DIAGNOSIS — N186 End stage renal disease: Secondary | ICD-10-CM | POA: Diagnosis not present

## 2017-09-01 DIAGNOSIS — N2581 Secondary hyperparathyroidism of renal origin: Secondary | ICD-10-CM | POA: Diagnosis not present

## 2017-09-01 DIAGNOSIS — D631 Anemia in chronic kidney disease: Secondary | ICD-10-CM | POA: Diagnosis not present

## 2017-09-01 DIAGNOSIS — Z992 Dependence on renal dialysis: Secondary | ICD-10-CM | POA: Diagnosis not present

## 2017-09-01 DIAGNOSIS — D509 Iron deficiency anemia, unspecified: Secondary | ICD-10-CM | POA: Diagnosis not present

## 2017-09-04 DIAGNOSIS — N186 End stage renal disease: Secondary | ICD-10-CM | POA: Diagnosis not present

## 2017-09-04 DIAGNOSIS — D509 Iron deficiency anemia, unspecified: Secondary | ICD-10-CM | POA: Diagnosis not present

## 2017-09-04 DIAGNOSIS — N2581 Secondary hyperparathyroidism of renal origin: Secondary | ICD-10-CM | POA: Diagnosis not present

## 2017-09-04 DIAGNOSIS — D631 Anemia in chronic kidney disease: Secondary | ICD-10-CM | POA: Diagnosis not present

## 2017-09-04 DIAGNOSIS — Z992 Dependence on renal dialysis: Secondary | ICD-10-CM | POA: Diagnosis not present

## 2017-09-06 DIAGNOSIS — N2581 Secondary hyperparathyroidism of renal origin: Secondary | ICD-10-CM | POA: Diagnosis not present

## 2017-09-06 DIAGNOSIS — D509 Iron deficiency anemia, unspecified: Secondary | ICD-10-CM | POA: Diagnosis not present

## 2017-09-06 DIAGNOSIS — D631 Anemia in chronic kidney disease: Secondary | ICD-10-CM | POA: Diagnosis not present

## 2017-09-06 DIAGNOSIS — Z992 Dependence on renal dialysis: Secondary | ICD-10-CM | POA: Diagnosis not present

## 2017-09-06 DIAGNOSIS — N186 End stage renal disease: Secondary | ICD-10-CM | POA: Diagnosis not present

## 2017-09-08 DIAGNOSIS — Z992 Dependence on renal dialysis: Secondary | ICD-10-CM | POA: Diagnosis not present

## 2017-09-08 DIAGNOSIS — N2581 Secondary hyperparathyroidism of renal origin: Secondary | ICD-10-CM | POA: Diagnosis not present

## 2017-09-08 DIAGNOSIS — N186 End stage renal disease: Secondary | ICD-10-CM | POA: Diagnosis not present

## 2017-09-08 DIAGNOSIS — D631 Anemia in chronic kidney disease: Secondary | ICD-10-CM | POA: Diagnosis not present

## 2017-09-08 DIAGNOSIS — D509 Iron deficiency anemia, unspecified: Secondary | ICD-10-CM | POA: Diagnosis not present

## 2017-09-10 DIAGNOSIS — N186 End stage renal disease: Secondary | ICD-10-CM | POA: Diagnosis not present

## 2017-09-10 DIAGNOSIS — Z992 Dependence on renal dialysis: Secondary | ICD-10-CM | POA: Diagnosis not present

## 2017-09-10 DIAGNOSIS — D631 Anemia in chronic kidney disease: Secondary | ICD-10-CM | POA: Diagnosis not present

## 2017-09-10 DIAGNOSIS — D509 Iron deficiency anemia, unspecified: Secondary | ICD-10-CM | POA: Diagnosis not present

## 2017-09-10 DIAGNOSIS — N2581 Secondary hyperparathyroidism of renal origin: Secondary | ICD-10-CM | POA: Diagnosis not present

## 2017-09-13 DIAGNOSIS — N2581 Secondary hyperparathyroidism of renal origin: Secondary | ICD-10-CM | POA: Diagnosis not present

## 2017-09-13 DIAGNOSIS — N186 End stage renal disease: Secondary | ICD-10-CM | POA: Diagnosis not present

## 2017-09-13 DIAGNOSIS — D509 Iron deficiency anemia, unspecified: Secondary | ICD-10-CM | POA: Diagnosis not present

## 2017-09-13 DIAGNOSIS — Z992 Dependence on renal dialysis: Secondary | ICD-10-CM | POA: Diagnosis not present

## 2017-09-13 DIAGNOSIS — D631 Anemia in chronic kidney disease: Secondary | ICD-10-CM | POA: Diagnosis not present

## 2017-09-15 DIAGNOSIS — D631 Anemia in chronic kidney disease: Secondary | ICD-10-CM | POA: Diagnosis not present

## 2017-09-15 DIAGNOSIS — D509 Iron deficiency anemia, unspecified: Secondary | ICD-10-CM | POA: Diagnosis not present

## 2017-09-15 DIAGNOSIS — Z992 Dependence on renal dialysis: Secondary | ICD-10-CM | POA: Diagnosis not present

## 2017-09-15 DIAGNOSIS — N2581 Secondary hyperparathyroidism of renal origin: Secondary | ICD-10-CM | POA: Diagnosis not present

## 2017-09-15 DIAGNOSIS — N186 End stage renal disease: Secondary | ICD-10-CM | POA: Diagnosis not present

## 2017-09-17 DIAGNOSIS — N2581 Secondary hyperparathyroidism of renal origin: Secondary | ICD-10-CM | POA: Diagnosis not present

## 2017-09-17 DIAGNOSIS — N186 End stage renal disease: Secondary | ICD-10-CM | POA: Diagnosis not present

## 2017-09-17 DIAGNOSIS — D631 Anemia in chronic kidney disease: Secondary | ICD-10-CM | POA: Diagnosis not present

## 2017-09-17 DIAGNOSIS — Z992 Dependence on renal dialysis: Secondary | ICD-10-CM | POA: Diagnosis not present

## 2017-09-17 DIAGNOSIS — D509 Iron deficiency anemia, unspecified: Secondary | ICD-10-CM | POA: Diagnosis not present

## 2017-09-19 DIAGNOSIS — Z992 Dependence on renal dialysis: Secondary | ICD-10-CM | POA: Diagnosis not present

## 2017-09-19 DIAGNOSIS — N186 End stage renal disease: Secondary | ICD-10-CM | POA: Diagnosis not present

## 2017-09-20 DIAGNOSIS — D631 Anemia in chronic kidney disease: Secondary | ICD-10-CM | POA: Diagnosis not present

## 2017-09-20 DIAGNOSIS — Z23 Encounter for immunization: Secondary | ICD-10-CM | POA: Diagnosis not present

## 2017-09-20 DIAGNOSIS — N186 End stage renal disease: Secondary | ICD-10-CM | POA: Diagnosis not present

## 2017-09-20 DIAGNOSIS — Z992 Dependence on renal dialysis: Secondary | ICD-10-CM | POA: Diagnosis not present

## 2017-09-20 DIAGNOSIS — N2581 Secondary hyperparathyroidism of renal origin: Secondary | ICD-10-CM | POA: Diagnosis not present

## 2017-09-20 DIAGNOSIS — D509 Iron deficiency anemia, unspecified: Secondary | ICD-10-CM | POA: Diagnosis not present

## 2017-09-22 DIAGNOSIS — D509 Iron deficiency anemia, unspecified: Secondary | ICD-10-CM | POA: Diagnosis not present

## 2017-09-22 DIAGNOSIS — Z23 Encounter for immunization: Secondary | ICD-10-CM | POA: Diagnosis not present

## 2017-09-22 DIAGNOSIS — D631 Anemia in chronic kidney disease: Secondary | ICD-10-CM | POA: Diagnosis not present

## 2017-09-22 DIAGNOSIS — N186 End stage renal disease: Secondary | ICD-10-CM | POA: Diagnosis not present

## 2017-09-22 DIAGNOSIS — N2581 Secondary hyperparathyroidism of renal origin: Secondary | ICD-10-CM | POA: Diagnosis not present

## 2017-09-22 DIAGNOSIS — Z992 Dependence on renal dialysis: Secondary | ICD-10-CM | POA: Diagnosis not present

## 2017-09-24 DIAGNOSIS — Z992 Dependence on renal dialysis: Secondary | ICD-10-CM | POA: Diagnosis not present

## 2017-09-24 DIAGNOSIS — D509 Iron deficiency anemia, unspecified: Secondary | ICD-10-CM | POA: Diagnosis not present

## 2017-09-24 DIAGNOSIS — N186 End stage renal disease: Secondary | ICD-10-CM | POA: Diagnosis not present

## 2017-09-24 DIAGNOSIS — N2581 Secondary hyperparathyroidism of renal origin: Secondary | ICD-10-CM | POA: Diagnosis not present

## 2017-09-24 DIAGNOSIS — Z23 Encounter for immunization: Secondary | ICD-10-CM | POA: Diagnosis not present

## 2017-09-24 DIAGNOSIS — D631 Anemia in chronic kidney disease: Secondary | ICD-10-CM | POA: Diagnosis not present

## 2017-09-27 DIAGNOSIS — D631 Anemia in chronic kidney disease: Secondary | ICD-10-CM | POA: Diagnosis not present

## 2017-09-27 DIAGNOSIS — D509 Iron deficiency anemia, unspecified: Secondary | ICD-10-CM | POA: Diagnosis not present

## 2017-09-27 DIAGNOSIS — N186 End stage renal disease: Secondary | ICD-10-CM | POA: Diagnosis not present

## 2017-09-27 DIAGNOSIS — N2581 Secondary hyperparathyroidism of renal origin: Secondary | ICD-10-CM | POA: Diagnosis not present

## 2017-09-27 DIAGNOSIS — Z992 Dependence on renal dialysis: Secondary | ICD-10-CM | POA: Diagnosis not present

## 2017-09-27 DIAGNOSIS — Z23 Encounter for immunization: Secondary | ICD-10-CM | POA: Diagnosis not present

## 2017-09-29 DIAGNOSIS — N2581 Secondary hyperparathyroidism of renal origin: Secondary | ICD-10-CM | POA: Diagnosis not present

## 2017-09-29 DIAGNOSIS — N186 End stage renal disease: Secondary | ICD-10-CM | POA: Diagnosis not present

## 2017-09-29 DIAGNOSIS — D509 Iron deficiency anemia, unspecified: Secondary | ICD-10-CM | POA: Diagnosis not present

## 2017-09-29 DIAGNOSIS — Z23 Encounter for immunization: Secondary | ICD-10-CM | POA: Diagnosis not present

## 2017-09-29 DIAGNOSIS — Z992 Dependence on renal dialysis: Secondary | ICD-10-CM | POA: Diagnosis not present

## 2017-09-29 DIAGNOSIS — D631 Anemia in chronic kidney disease: Secondary | ICD-10-CM | POA: Diagnosis not present

## 2017-10-01 DIAGNOSIS — D509 Iron deficiency anemia, unspecified: Secondary | ICD-10-CM | POA: Diagnosis not present

## 2017-10-01 DIAGNOSIS — Z992 Dependence on renal dialysis: Secondary | ICD-10-CM | POA: Diagnosis not present

## 2017-10-01 DIAGNOSIS — N186 End stage renal disease: Secondary | ICD-10-CM | POA: Diagnosis not present

## 2017-10-01 DIAGNOSIS — D631 Anemia in chronic kidney disease: Secondary | ICD-10-CM | POA: Diagnosis not present

## 2017-10-01 DIAGNOSIS — N2581 Secondary hyperparathyroidism of renal origin: Secondary | ICD-10-CM | POA: Diagnosis not present

## 2017-10-01 DIAGNOSIS — Z23 Encounter for immunization: Secondary | ICD-10-CM | POA: Diagnosis not present

## 2017-10-04 DIAGNOSIS — D509 Iron deficiency anemia, unspecified: Secondary | ICD-10-CM | POA: Diagnosis not present

## 2017-10-04 DIAGNOSIS — N186 End stage renal disease: Secondary | ICD-10-CM | POA: Diagnosis not present

## 2017-10-04 DIAGNOSIS — Z23 Encounter for immunization: Secondary | ICD-10-CM | POA: Diagnosis not present

## 2017-10-04 DIAGNOSIS — Z992 Dependence on renal dialysis: Secondary | ICD-10-CM | POA: Diagnosis not present

## 2017-10-04 DIAGNOSIS — N2581 Secondary hyperparathyroidism of renal origin: Secondary | ICD-10-CM | POA: Diagnosis not present

## 2017-10-04 DIAGNOSIS — D631 Anemia in chronic kidney disease: Secondary | ICD-10-CM | POA: Diagnosis not present

## 2017-10-06 DIAGNOSIS — Z992 Dependence on renal dialysis: Secondary | ICD-10-CM | POA: Diagnosis not present

## 2017-10-06 DIAGNOSIS — D631 Anemia in chronic kidney disease: Secondary | ICD-10-CM | POA: Diagnosis not present

## 2017-10-06 DIAGNOSIS — N186 End stage renal disease: Secondary | ICD-10-CM | POA: Diagnosis not present

## 2017-10-06 DIAGNOSIS — N2581 Secondary hyperparathyroidism of renal origin: Secondary | ICD-10-CM | POA: Diagnosis not present

## 2017-10-06 DIAGNOSIS — Z23 Encounter for immunization: Secondary | ICD-10-CM | POA: Diagnosis not present

## 2017-10-06 DIAGNOSIS — D509 Iron deficiency anemia, unspecified: Secondary | ICD-10-CM | POA: Diagnosis not present

## 2017-10-08 DIAGNOSIS — N2581 Secondary hyperparathyroidism of renal origin: Secondary | ICD-10-CM | POA: Diagnosis not present

## 2017-10-08 DIAGNOSIS — Z992 Dependence on renal dialysis: Secondary | ICD-10-CM | POA: Diagnosis not present

## 2017-10-08 DIAGNOSIS — Z23 Encounter for immunization: Secondary | ICD-10-CM | POA: Diagnosis not present

## 2017-10-08 DIAGNOSIS — D509 Iron deficiency anemia, unspecified: Secondary | ICD-10-CM | POA: Diagnosis not present

## 2017-10-08 DIAGNOSIS — N186 End stage renal disease: Secondary | ICD-10-CM | POA: Diagnosis not present

## 2017-10-08 DIAGNOSIS — D631 Anemia in chronic kidney disease: Secondary | ICD-10-CM | POA: Diagnosis not present

## 2017-10-11 DIAGNOSIS — D509 Iron deficiency anemia, unspecified: Secondary | ICD-10-CM | POA: Diagnosis not present

## 2017-10-11 DIAGNOSIS — D631 Anemia in chronic kidney disease: Secondary | ICD-10-CM | POA: Diagnosis not present

## 2017-10-11 DIAGNOSIS — N186 End stage renal disease: Secondary | ICD-10-CM | POA: Diagnosis not present

## 2017-10-11 DIAGNOSIS — N2581 Secondary hyperparathyroidism of renal origin: Secondary | ICD-10-CM | POA: Diagnosis not present

## 2017-10-11 DIAGNOSIS — Z23 Encounter for immunization: Secondary | ICD-10-CM | POA: Diagnosis not present

## 2017-10-11 DIAGNOSIS — Z992 Dependence on renal dialysis: Secondary | ICD-10-CM | POA: Diagnosis not present

## 2017-10-13 DIAGNOSIS — N2581 Secondary hyperparathyroidism of renal origin: Secondary | ICD-10-CM | POA: Diagnosis not present

## 2017-10-13 DIAGNOSIS — D509 Iron deficiency anemia, unspecified: Secondary | ICD-10-CM | POA: Diagnosis not present

## 2017-10-13 DIAGNOSIS — N186 End stage renal disease: Secondary | ICD-10-CM | POA: Diagnosis not present

## 2017-10-13 DIAGNOSIS — Z992 Dependence on renal dialysis: Secondary | ICD-10-CM | POA: Diagnosis not present

## 2017-10-13 DIAGNOSIS — Z23 Encounter for immunization: Secondary | ICD-10-CM | POA: Diagnosis not present

## 2017-10-13 DIAGNOSIS — D631 Anemia in chronic kidney disease: Secondary | ICD-10-CM | POA: Diagnosis not present

## 2017-10-15 DIAGNOSIS — N2581 Secondary hyperparathyroidism of renal origin: Secondary | ICD-10-CM | POA: Diagnosis not present

## 2017-10-15 DIAGNOSIS — D631 Anemia in chronic kidney disease: Secondary | ICD-10-CM | POA: Diagnosis not present

## 2017-10-15 DIAGNOSIS — Z992 Dependence on renal dialysis: Secondary | ICD-10-CM | POA: Diagnosis not present

## 2017-10-15 DIAGNOSIS — N186 End stage renal disease: Secondary | ICD-10-CM | POA: Diagnosis not present

## 2017-10-15 DIAGNOSIS — Z23 Encounter for immunization: Secondary | ICD-10-CM | POA: Diagnosis not present

## 2017-10-15 DIAGNOSIS — D509 Iron deficiency anemia, unspecified: Secondary | ICD-10-CM | POA: Diagnosis not present

## 2017-10-18 DIAGNOSIS — D509 Iron deficiency anemia, unspecified: Secondary | ICD-10-CM | POA: Diagnosis not present

## 2017-10-18 DIAGNOSIS — Z23 Encounter for immunization: Secondary | ICD-10-CM | POA: Diagnosis not present

## 2017-10-18 DIAGNOSIS — N2581 Secondary hyperparathyroidism of renal origin: Secondary | ICD-10-CM | POA: Diagnosis not present

## 2017-10-18 DIAGNOSIS — N186 End stage renal disease: Secondary | ICD-10-CM | POA: Diagnosis not present

## 2017-10-18 DIAGNOSIS — Z992 Dependence on renal dialysis: Secondary | ICD-10-CM | POA: Diagnosis not present

## 2017-10-18 DIAGNOSIS — D631 Anemia in chronic kidney disease: Secondary | ICD-10-CM | POA: Diagnosis not present

## 2017-10-20 DIAGNOSIS — Z23 Encounter for immunization: Secondary | ICD-10-CM | POA: Diagnosis not present

## 2017-10-20 DIAGNOSIS — D509 Iron deficiency anemia, unspecified: Secondary | ICD-10-CM | POA: Diagnosis not present

## 2017-10-20 DIAGNOSIS — N2581 Secondary hyperparathyroidism of renal origin: Secondary | ICD-10-CM | POA: Diagnosis not present

## 2017-10-20 DIAGNOSIS — Z992 Dependence on renal dialysis: Secondary | ICD-10-CM | POA: Diagnosis not present

## 2017-10-20 DIAGNOSIS — D631 Anemia in chronic kidney disease: Secondary | ICD-10-CM | POA: Diagnosis not present

## 2017-10-20 DIAGNOSIS — N186 End stage renal disease: Secondary | ICD-10-CM | POA: Diagnosis not present

## 2017-10-22 DIAGNOSIS — N186 End stage renal disease: Secondary | ICD-10-CM | POA: Diagnosis not present

## 2017-10-22 DIAGNOSIS — D509 Iron deficiency anemia, unspecified: Secondary | ICD-10-CM | POA: Diagnosis not present

## 2017-10-22 DIAGNOSIS — Z992 Dependence on renal dialysis: Secondary | ICD-10-CM | POA: Diagnosis not present

## 2017-10-22 DIAGNOSIS — N2581 Secondary hyperparathyroidism of renal origin: Secondary | ICD-10-CM | POA: Diagnosis not present

## 2017-10-25 DIAGNOSIS — N2581 Secondary hyperparathyroidism of renal origin: Secondary | ICD-10-CM | POA: Diagnosis not present

## 2017-10-25 DIAGNOSIS — Z992 Dependence on renal dialysis: Secondary | ICD-10-CM | POA: Diagnosis not present

## 2017-10-25 DIAGNOSIS — N186 End stage renal disease: Secondary | ICD-10-CM | POA: Diagnosis not present

## 2017-10-25 DIAGNOSIS — D509 Iron deficiency anemia, unspecified: Secondary | ICD-10-CM | POA: Diagnosis not present

## 2017-10-27 DIAGNOSIS — Z992 Dependence on renal dialysis: Secondary | ICD-10-CM | POA: Diagnosis not present

## 2017-10-27 DIAGNOSIS — D509 Iron deficiency anemia, unspecified: Secondary | ICD-10-CM | POA: Diagnosis not present

## 2017-10-27 DIAGNOSIS — N186 End stage renal disease: Secondary | ICD-10-CM | POA: Diagnosis not present

## 2017-10-27 DIAGNOSIS — N2581 Secondary hyperparathyroidism of renal origin: Secondary | ICD-10-CM | POA: Diagnosis not present

## 2017-10-29 DIAGNOSIS — N186 End stage renal disease: Secondary | ICD-10-CM | POA: Diagnosis not present

## 2017-10-29 DIAGNOSIS — D509 Iron deficiency anemia, unspecified: Secondary | ICD-10-CM | POA: Diagnosis not present

## 2017-10-29 DIAGNOSIS — N2581 Secondary hyperparathyroidism of renal origin: Secondary | ICD-10-CM | POA: Diagnosis not present

## 2017-10-29 DIAGNOSIS — Z992 Dependence on renal dialysis: Secondary | ICD-10-CM | POA: Diagnosis not present

## 2017-11-01 DIAGNOSIS — N2581 Secondary hyperparathyroidism of renal origin: Secondary | ICD-10-CM | POA: Diagnosis not present

## 2017-11-01 DIAGNOSIS — Z992 Dependence on renal dialysis: Secondary | ICD-10-CM | POA: Diagnosis not present

## 2017-11-01 DIAGNOSIS — N186 End stage renal disease: Secondary | ICD-10-CM | POA: Diagnosis not present

## 2017-11-01 DIAGNOSIS — D509 Iron deficiency anemia, unspecified: Secondary | ICD-10-CM | POA: Diagnosis not present

## 2017-11-03 DIAGNOSIS — D509 Iron deficiency anemia, unspecified: Secondary | ICD-10-CM | POA: Diagnosis not present

## 2017-11-03 DIAGNOSIS — N2581 Secondary hyperparathyroidism of renal origin: Secondary | ICD-10-CM | POA: Diagnosis not present

## 2017-11-03 DIAGNOSIS — N186 End stage renal disease: Secondary | ICD-10-CM | POA: Diagnosis not present

## 2017-11-03 DIAGNOSIS — Z992 Dependence on renal dialysis: Secondary | ICD-10-CM | POA: Diagnosis not present

## 2017-11-05 DIAGNOSIS — N186 End stage renal disease: Secondary | ICD-10-CM | POA: Diagnosis not present

## 2017-11-05 DIAGNOSIS — Z992 Dependence on renal dialysis: Secondary | ICD-10-CM | POA: Diagnosis not present

## 2017-11-05 DIAGNOSIS — N2581 Secondary hyperparathyroidism of renal origin: Secondary | ICD-10-CM | POA: Diagnosis not present

## 2017-11-05 DIAGNOSIS — D509 Iron deficiency anemia, unspecified: Secondary | ICD-10-CM | POA: Diagnosis not present

## 2017-11-08 DIAGNOSIS — Z992 Dependence on renal dialysis: Secondary | ICD-10-CM | POA: Diagnosis not present

## 2017-11-08 DIAGNOSIS — N186 End stage renal disease: Secondary | ICD-10-CM | POA: Diagnosis not present

## 2017-11-08 DIAGNOSIS — N2581 Secondary hyperparathyroidism of renal origin: Secondary | ICD-10-CM | POA: Diagnosis not present

## 2017-11-08 DIAGNOSIS — D509 Iron deficiency anemia, unspecified: Secondary | ICD-10-CM | POA: Diagnosis not present

## 2017-11-10 DIAGNOSIS — Z992 Dependence on renal dialysis: Secondary | ICD-10-CM | POA: Diagnosis not present

## 2017-11-10 DIAGNOSIS — N186 End stage renal disease: Secondary | ICD-10-CM | POA: Diagnosis not present

## 2017-11-10 DIAGNOSIS — D509 Iron deficiency anemia, unspecified: Secondary | ICD-10-CM | POA: Diagnosis not present

## 2017-11-10 DIAGNOSIS — N2581 Secondary hyperparathyroidism of renal origin: Secondary | ICD-10-CM | POA: Diagnosis not present

## 2017-11-12 DIAGNOSIS — N2581 Secondary hyperparathyroidism of renal origin: Secondary | ICD-10-CM | POA: Diagnosis not present

## 2017-11-12 DIAGNOSIS — N186 End stage renal disease: Secondary | ICD-10-CM | POA: Diagnosis not present

## 2017-11-12 DIAGNOSIS — D509 Iron deficiency anemia, unspecified: Secondary | ICD-10-CM | POA: Diagnosis not present

## 2017-11-12 DIAGNOSIS — Z992 Dependence on renal dialysis: Secondary | ICD-10-CM | POA: Diagnosis not present

## 2017-11-15 DIAGNOSIS — D509 Iron deficiency anemia, unspecified: Secondary | ICD-10-CM | POA: Diagnosis not present

## 2017-11-15 DIAGNOSIS — Z992 Dependence on renal dialysis: Secondary | ICD-10-CM | POA: Diagnosis not present

## 2017-11-15 DIAGNOSIS — N2581 Secondary hyperparathyroidism of renal origin: Secondary | ICD-10-CM | POA: Diagnosis not present

## 2017-11-15 DIAGNOSIS — N186 End stage renal disease: Secondary | ICD-10-CM | POA: Diagnosis not present

## 2017-11-17 DIAGNOSIS — N186 End stage renal disease: Secondary | ICD-10-CM | POA: Diagnosis not present

## 2017-11-17 DIAGNOSIS — Z992 Dependence on renal dialysis: Secondary | ICD-10-CM | POA: Diagnosis not present

## 2017-11-17 DIAGNOSIS — D509 Iron deficiency anemia, unspecified: Secondary | ICD-10-CM | POA: Diagnosis not present

## 2017-11-17 DIAGNOSIS — N2581 Secondary hyperparathyroidism of renal origin: Secondary | ICD-10-CM | POA: Diagnosis not present

## 2017-11-19 DIAGNOSIS — D509 Iron deficiency anemia, unspecified: Secondary | ICD-10-CM | POA: Diagnosis not present

## 2017-11-19 DIAGNOSIS — N186 End stage renal disease: Secondary | ICD-10-CM | POA: Diagnosis not present

## 2017-11-19 DIAGNOSIS — N2581 Secondary hyperparathyroidism of renal origin: Secondary | ICD-10-CM | POA: Diagnosis not present

## 2017-11-19 DIAGNOSIS — Z992 Dependence on renal dialysis: Secondary | ICD-10-CM | POA: Diagnosis not present

## 2017-11-22 DIAGNOSIS — N2581 Secondary hyperparathyroidism of renal origin: Secondary | ICD-10-CM | POA: Diagnosis not present

## 2017-11-22 DIAGNOSIS — N186 End stage renal disease: Secondary | ICD-10-CM | POA: Diagnosis not present

## 2017-11-22 DIAGNOSIS — D509 Iron deficiency anemia, unspecified: Secondary | ICD-10-CM | POA: Diagnosis not present

## 2017-11-22 DIAGNOSIS — D631 Anemia in chronic kidney disease: Secondary | ICD-10-CM | POA: Diagnosis not present

## 2017-11-22 DIAGNOSIS — Z992 Dependence on renal dialysis: Secondary | ICD-10-CM | POA: Diagnosis not present

## 2017-11-24 DIAGNOSIS — N2581 Secondary hyperparathyroidism of renal origin: Secondary | ICD-10-CM | POA: Diagnosis not present

## 2017-11-24 DIAGNOSIS — Z992 Dependence on renal dialysis: Secondary | ICD-10-CM | POA: Diagnosis not present

## 2017-11-24 DIAGNOSIS — D509 Iron deficiency anemia, unspecified: Secondary | ICD-10-CM | POA: Diagnosis not present

## 2017-11-24 DIAGNOSIS — D631 Anemia in chronic kidney disease: Secondary | ICD-10-CM | POA: Diagnosis not present

## 2017-11-24 DIAGNOSIS — N186 End stage renal disease: Secondary | ICD-10-CM | POA: Diagnosis not present

## 2017-11-26 DIAGNOSIS — D509 Iron deficiency anemia, unspecified: Secondary | ICD-10-CM | POA: Diagnosis not present

## 2017-11-26 DIAGNOSIS — Z992 Dependence on renal dialysis: Secondary | ICD-10-CM | POA: Diagnosis not present

## 2017-11-26 DIAGNOSIS — N186 End stage renal disease: Secondary | ICD-10-CM | POA: Diagnosis not present

## 2017-11-26 DIAGNOSIS — N2581 Secondary hyperparathyroidism of renal origin: Secondary | ICD-10-CM | POA: Diagnosis not present

## 2017-11-26 DIAGNOSIS — D631 Anemia in chronic kidney disease: Secondary | ICD-10-CM | POA: Diagnosis not present

## 2017-11-29 DIAGNOSIS — Z992 Dependence on renal dialysis: Secondary | ICD-10-CM | POA: Diagnosis not present

## 2017-11-29 DIAGNOSIS — D631 Anemia in chronic kidney disease: Secondary | ICD-10-CM | POA: Diagnosis not present

## 2017-11-29 DIAGNOSIS — N2581 Secondary hyperparathyroidism of renal origin: Secondary | ICD-10-CM | POA: Diagnosis not present

## 2017-11-29 DIAGNOSIS — N186 End stage renal disease: Secondary | ICD-10-CM | POA: Diagnosis not present

## 2017-11-29 DIAGNOSIS — D509 Iron deficiency anemia, unspecified: Secondary | ICD-10-CM | POA: Diagnosis not present

## 2017-12-01 DIAGNOSIS — D509 Iron deficiency anemia, unspecified: Secondary | ICD-10-CM | POA: Diagnosis not present

## 2017-12-01 DIAGNOSIS — Z992 Dependence on renal dialysis: Secondary | ICD-10-CM | POA: Diagnosis not present

## 2017-12-01 DIAGNOSIS — N2581 Secondary hyperparathyroidism of renal origin: Secondary | ICD-10-CM | POA: Diagnosis not present

## 2017-12-01 DIAGNOSIS — D631 Anemia in chronic kidney disease: Secondary | ICD-10-CM | POA: Diagnosis not present

## 2017-12-01 DIAGNOSIS — N186 End stage renal disease: Secondary | ICD-10-CM | POA: Diagnosis not present

## 2017-12-03 DIAGNOSIS — D509 Iron deficiency anemia, unspecified: Secondary | ICD-10-CM | POA: Diagnosis not present

## 2017-12-03 DIAGNOSIS — N2581 Secondary hyperparathyroidism of renal origin: Secondary | ICD-10-CM | POA: Diagnosis not present

## 2017-12-03 DIAGNOSIS — N186 End stage renal disease: Secondary | ICD-10-CM | POA: Diagnosis not present

## 2017-12-03 DIAGNOSIS — Z992 Dependence on renal dialysis: Secondary | ICD-10-CM | POA: Diagnosis not present

## 2017-12-03 DIAGNOSIS — D631 Anemia in chronic kidney disease: Secondary | ICD-10-CM | POA: Diagnosis not present

## 2017-12-06 DIAGNOSIS — D631 Anemia in chronic kidney disease: Secondary | ICD-10-CM | POA: Diagnosis not present

## 2017-12-06 DIAGNOSIS — N186 End stage renal disease: Secondary | ICD-10-CM | POA: Diagnosis not present

## 2017-12-06 DIAGNOSIS — Z992 Dependence on renal dialysis: Secondary | ICD-10-CM | POA: Diagnosis not present

## 2017-12-06 DIAGNOSIS — D509 Iron deficiency anemia, unspecified: Secondary | ICD-10-CM | POA: Diagnosis not present

## 2017-12-06 DIAGNOSIS — N2581 Secondary hyperparathyroidism of renal origin: Secondary | ICD-10-CM | POA: Diagnosis not present

## 2017-12-08 DIAGNOSIS — Z992 Dependence on renal dialysis: Secondary | ICD-10-CM | POA: Diagnosis not present

## 2017-12-08 DIAGNOSIS — N186 End stage renal disease: Secondary | ICD-10-CM | POA: Diagnosis not present

## 2017-12-08 DIAGNOSIS — N2581 Secondary hyperparathyroidism of renal origin: Secondary | ICD-10-CM | POA: Diagnosis not present

## 2017-12-08 DIAGNOSIS — D509 Iron deficiency anemia, unspecified: Secondary | ICD-10-CM | POA: Diagnosis not present

## 2017-12-08 DIAGNOSIS — D631 Anemia in chronic kidney disease: Secondary | ICD-10-CM | POA: Diagnosis not present

## 2017-12-10 DIAGNOSIS — D631 Anemia in chronic kidney disease: Secondary | ICD-10-CM | POA: Diagnosis not present

## 2017-12-10 DIAGNOSIS — N2581 Secondary hyperparathyroidism of renal origin: Secondary | ICD-10-CM | POA: Diagnosis not present

## 2017-12-10 DIAGNOSIS — N186 End stage renal disease: Secondary | ICD-10-CM | POA: Diagnosis not present

## 2017-12-10 DIAGNOSIS — D509 Iron deficiency anemia, unspecified: Secondary | ICD-10-CM | POA: Diagnosis not present

## 2017-12-10 DIAGNOSIS — Z992 Dependence on renal dialysis: Secondary | ICD-10-CM | POA: Diagnosis not present

## 2017-12-12 DIAGNOSIS — N2581 Secondary hyperparathyroidism of renal origin: Secondary | ICD-10-CM | POA: Diagnosis not present

## 2017-12-12 DIAGNOSIS — D509 Iron deficiency anemia, unspecified: Secondary | ICD-10-CM | POA: Diagnosis not present

## 2017-12-12 DIAGNOSIS — Z992 Dependence on renal dialysis: Secondary | ICD-10-CM | POA: Diagnosis not present

## 2017-12-12 DIAGNOSIS — D631 Anemia in chronic kidney disease: Secondary | ICD-10-CM | POA: Diagnosis not present

## 2017-12-12 DIAGNOSIS — N186 End stage renal disease: Secondary | ICD-10-CM | POA: Diagnosis not present

## 2017-12-15 DIAGNOSIS — Z992 Dependence on renal dialysis: Secondary | ICD-10-CM | POA: Diagnosis not present

## 2017-12-15 DIAGNOSIS — N2581 Secondary hyperparathyroidism of renal origin: Secondary | ICD-10-CM | POA: Diagnosis not present

## 2017-12-15 DIAGNOSIS — N186 End stage renal disease: Secondary | ICD-10-CM | POA: Diagnosis not present

## 2017-12-15 DIAGNOSIS — D509 Iron deficiency anemia, unspecified: Secondary | ICD-10-CM | POA: Diagnosis not present

## 2017-12-15 DIAGNOSIS — D631 Anemia in chronic kidney disease: Secondary | ICD-10-CM | POA: Diagnosis not present

## 2017-12-17 DIAGNOSIS — N2581 Secondary hyperparathyroidism of renal origin: Secondary | ICD-10-CM | POA: Diagnosis not present

## 2017-12-17 DIAGNOSIS — D631 Anemia in chronic kidney disease: Secondary | ICD-10-CM | POA: Diagnosis not present

## 2017-12-17 DIAGNOSIS — Z992 Dependence on renal dialysis: Secondary | ICD-10-CM | POA: Diagnosis not present

## 2017-12-17 DIAGNOSIS — D509 Iron deficiency anemia, unspecified: Secondary | ICD-10-CM | POA: Diagnosis not present

## 2017-12-17 DIAGNOSIS — N186 End stage renal disease: Secondary | ICD-10-CM | POA: Diagnosis not present

## 2017-12-20 DIAGNOSIS — Z992 Dependence on renal dialysis: Secondary | ICD-10-CM | POA: Diagnosis not present

## 2017-12-20 DIAGNOSIS — D631 Anemia in chronic kidney disease: Secondary | ICD-10-CM | POA: Diagnosis not present

## 2017-12-20 DIAGNOSIS — N2581 Secondary hyperparathyroidism of renal origin: Secondary | ICD-10-CM | POA: Diagnosis not present

## 2017-12-20 DIAGNOSIS — N186 End stage renal disease: Secondary | ICD-10-CM | POA: Diagnosis not present

## 2017-12-20 DIAGNOSIS — D509 Iron deficiency anemia, unspecified: Secondary | ICD-10-CM | POA: Diagnosis not present

## 2017-12-22 DIAGNOSIS — N2581 Secondary hyperparathyroidism of renal origin: Secondary | ICD-10-CM | POA: Diagnosis not present

## 2017-12-22 DIAGNOSIS — D509 Iron deficiency anemia, unspecified: Secondary | ICD-10-CM | POA: Diagnosis not present

## 2017-12-22 DIAGNOSIS — N186 End stage renal disease: Secondary | ICD-10-CM | POA: Diagnosis not present

## 2017-12-22 DIAGNOSIS — Z992 Dependence on renal dialysis: Secondary | ICD-10-CM | POA: Diagnosis not present

## 2017-12-22 DIAGNOSIS — D631 Anemia in chronic kidney disease: Secondary | ICD-10-CM | POA: Diagnosis not present

## 2017-12-24 DIAGNOSIS — D509 Iron deficiency anemia, unspecified: Secondary | ICD-10-CM | POA: Diagnosis not present

## 2017-12-24 DIAGNOSIS — Z992 Dependence on renal dialysis: Secondary | ICD-10-CM | POA: Diagnosis not present

## 2017-12-24 DIAGNOSIS — N186 End stage renal disease: Secondary | ICD-10-CM | POA: Diagnosis not present

## 2017-12-24 DIAGNOSIS — D631 Anemia in chronic kidney disease: Secondary | ICD-10-CM | POA: Diagnosis not present

## 2017-12-24 DIAGNOSIS — N2581 Secondary hyperparathyroidism of renal origin: Secondary | ICD-10-CM | POA: Diagnosis not present

## 2017-12-27 DIAGNOSIS — Z992 Dependence on renal dialysis: Secondary | ICD-10-CM | POA: Diagnosis not present

## 2017-12-27 DIAGNOSIS — N2581 Secondary hyperparathyroidism of renal origin: Secondary | ICD-10-CM | POA: Diagnosis not present

## 2017-12-27 DIAGNOSIS — N186 End stage renal disease: Secondary | ICD-10-CM | POA: Diagnosis not present

## 2017-12-27 DIAGNOSIS — D509 Iron deficiency anemia, unspecified: Secondary | ICD-10-CM | POA: Diagnosis not present

## 2017-12-27 DIAGNOSIS — D631 Anemia in chronic kidney disease: Secondary | ICD-10-CM | POA: Diagnosis not present

## 2017-12-29 DIAGNOSIS — D509 Iron deficiency anemia, unspecified: Secondary | ICD-10-CM | POA: Diagnosis not present

## 2017-12-29 DIAGNOSIS — N2581 Secondary hyperparathyroidism of renal origin: Secondary | ICD-10-CM | POA: Diagnosis not present

## 2017-12-29 DIAGNOSIS — N186 End stage renal disease: Secondary | ICD-10-CM | POA: Diagnosis not present

## 2017-12-29 DIAGNOSIS — D631 Anemia in chronic kidney disease: Secondary | ICD-10-CM | POA: Diagnosis not present

## 2017-12-29 DIAGNOSIS — Z992 Dependence on renal dialysis: Secondary | ICD-10-CM | POA: Diagnosis not present

## 2017-12-31 DIAGNOSIS — N2581 Secondary hyperparathyroidism of renal origin: Secondary | ICD-10-CM | POA: Diagnosis not present

## 2017-12-31 DIAGNOSIS — D631 Anemia in chronic kidney disease: Secondary | ICD-10-CM | POA: Diagnosis not present

## 2017-12-31 DIAGNOSIS — N186 End stage renal disease: Secondary | ICD-10-CM | POA: Diagnosis not present

## 2017-12-31 DIAGNOSIS — D509 Iron deficiency anemia, unspecified: Secondary | ICD-10-CM | POA: Diagnosis not present

## 2017-12-31 DIAGNOSIS — Z992 Dependence on renal dialysis: Secondary | ICD-10-CM | POA: Diagnosis not present

## 2018-01-03 DIAGNOSIS — D631 Anemia in chronic kidney disease: Secondary | ICD-10-CM | POA: Diagnosis not present

## 2018-01-03 DIAGNOSIS — Z992 Dependence on renal dialysis: Secondary | ICD-10-CM | POA: Diagnosis not present

## 2018-01-03 DIAGNOSIS — D509 Iron deficiency anemia, unspecified: Secondary | ICD-10-CM | POA: Diagnosis not present

## 2018-01-03 DIAGNOSIS — N186 End stage renal disease: Secondary | ICD-10-CM | POA: Diagnosis not present

## 2018-01-03 DIAGNOSIS — N2581 Secondary hyperparathyroidism of renal origin: Secondary | ICD-10-CM | POA: Diagnosis not present

## 2018-01-05 DIAGNOSIS — N2581 Secondary hyperparathyroidism of renal origin: Secondary | ICD-10-CM | POA: Diagnosis not present

## 2018-01-05 DIAGNOSIS — D509 Iron deficiency anemia, unspecified: Secondary | ICD-10-CM | POA: Diagnosis not present

## 2018-01-05 DIAGNOSIS — Z992 Dependence on renal dialysis: Secondary | ICD-10-CM | POA: Diagnosis not present

## 2018-01-05 DIAGNOSIS — N186 End stage renal disease: Secondary | ICD-10-CM | POA: Diagnosis not present

## 2018-01-05 DIAGNOSIS — D631 Anemia in chronic kidney disease: Secondary | ICD-10-CM | POA: Diagnosis not present

## 2018-01-07 DIAGNOSIS — D631 Anemia in chronic kidney disease: Secondary | ICD-10-CM | POA: Diagnosis not present

## 2018-01-07 DIAGNOSIS — N2581 Secondary hyperparathyroidism of renal origin: Secondary | ICD-10-CM | POA: Diagnosis not present

## 2018-01-07 DIAGNOSIS — N186 End stage renal disease: Secondary | ICD-10-CM | POA: Diagnosis not present

## 2018-01-07 DIAGNOSIS — D509 Iron deficiency anemia, unspecified: Secondary | ICD-10-CM | POA: Diagnosis not present

## 2018-01-07 DIAGNOSIS — Z992 Dependence on renal dialysis: Secondary | ICD-10-CM | POA: Diagnosis not present

## 2018-01-10 DIAGNOSIS — D509 Iron deficiency anemia, unspecified: Secondary | ICD-10-CM | POA: Diagnosis not present

## 2018-01-10 DIAGNOSIS — N2581 Secondary hyperparathyroidism of renal origin: Secondary | ICD-10-CM | POA: Diagnosis not present

## 2018-01-10 DIAGNOSIS — N186 End stage renal disease: Secondary | ICD-10-CM | POA: Diagnosis not present

## 2018-01-10 DIAGNOSIS — Z992 Dependence on renal dialysis: Secondary | ICD-10-CM | POA: Diagnosis not present

## 2018-01-10 DIAGNOSIS — D631 Anemia in chronic kidney disease: Secondary | ICD-10-CM | POA: Diagnosis not present

## 2018-01-12 DIAGNOSIS — N186 End stage renal disease: Secondary | ICD-10-CM | POA: Diagnosis not present

## 2018-01-12 DIAGNOSIS — N2581 Secondary hyperparathyroidism of renal origin: Secondary | ICD-10-CM | POA: Diagnosis not present

## 2018-01-12 DIAGNOSIS — Z992 Dependence on renal dialysis: Secondary | ICD-10-CM | POA: Diagnosis not present

## 2018-01-12 DIAGNOSIS — D509 Iron deficiency anemia, unspecified: Secondary | ICD-10-CM | POA: Diagnosis not present

## 2018-01-12 DIAGNOSIS — D631 Anemia in chronic kidney disease: Secondary | ICD-10-CM | POA: Diagnosis not present

## 2018-01-14 DIAGNOSIS — Z992 Dependence on renal dialysis: Secondary | ICD-10-CM | POA: Diagnosis not present

## 2018-01-14 DIAGNOSIS — D509 Iron deficiency anemia, unspecified: Secondary | ICD-10-CM | POA: Diagnosis not present

## 2018-01-14 DIAGNOSIS — N186 End stage renal disease: Secondary | ICD-10-CM | POA: Diagnosis not present

## 2018-01-14 DIAGNOSIS — N2581 Secondary hyperparathyroidism of renal origin: Secondary | ICD-10-CM | POA: Diagnosis not present

## 2018-01-14 DIAGNOSIS — D631 Anemia in chronic kidney disease: Secondary | ICD-10-CM | POA: Diagnosis not present

## 2018-01-17 DIAGNOSIS — D631 Anemia in chronic kidney disease: Secondary | ICD-10-CM | POA: Diagnosis not present

## 2018-01-17 DIAGNOSIS — N186 End stage renal disease: Secondary | ICD-10-CM | POA: Diagnosis not present

## 2018-01-17 DIAGNOSIS — Z992 Dependence on renal dialysis: Secondary | ICD-10-CM | POA: Diagnosis not present

## 2018-01-17 DIAGNOSIS — N2581 Secondary hyperparathyroidism of renal origin: Secondary | ICD-10-CM | POA: Diagnosis not present

## 2018-01-17 DIAGNOSIS — D509 Iron deficiency anemia, unspecified: Secondary | ICD-10-CM | POA: Diagnosis not present

## 2018-01-19 DIAGNOSIS — N2581 Secondary hyperparathyroidism of renal origin: Secondary | ICD-10-CM | POA: Diagnosis not present

## 2018-01-19 DIAGNOSIS — D509 Iron deficiency anemia, unspecified: Secondary | ICD-10-CM | POA: Diagnosis not present

## 2018-01-19 DIAGNOSIS — N186 End stage renal disease: Secondary | ICD-10-CM | POA: Diagnosis not present

## 2018-01-19 DIAGNOSIS — D631 Anemia in chronic kidney disease: Secondary | ICD-10-CM | POA: Diagnosis not present

## 2018-01-19 DIAGNOSIS — Z992 Dependence on renal dialysis: Secondary | ICD-10-CM | POA: Diagnosis not present

## 2018-01-20 DIAGNOSIS — N186 End stage renal disease: Secondary | ICD-10-CM | POA: Diagnosis not present

## 2018-01-20 DIAGNOSIS — Z992 Dependence on renal dialysis: Secondary | ICD-10-CM | POA: Diagnosis not present

## 2018-01-21 ENCOUNTER — Ambulatory Visit
Admission: RE | Admit: 2018-01-21 | Discharge: 2018-01-21 | Disposition: A | Discharge status: Home / Self Care | Payer: Medicare Other | Source: Ambulatory Visit | Attending: Nephrology | Admitting: Nephrology

## 2018-01-21 DIAGNOSIS — Z992 Dependence on renal dialysis: Secondary | ICD-10-CM | POA: Diagnosis not present

## 2018-01-21 DIAGNOSIS — D631 Anemia in chronic kidney disease: Secondary | ICD-10-CM | POA: Diagnosis not present

## 2018-01-21 DIAGNOSIS — D649 Anemia, unspecified: Secondary | ICD-10-CM | POA: Insufficient documentation

## 2018-01-21 DIAGNOSIS — N2581 Secondary hyperparathyroidism of renal origin: Secondary | ICD-10-CM | POA: Diagnosis not present

## 2018-01-21 DIAGNOSIS — N186 End stage renal disease: Secondary | ICD-10-CM | POA: Diagnosis not present

## 2018-01-21 DIAGNOSIS — D509 Iron deficiency anemia, unspecified: Secondary | ICD-10-CM | POA: Diagnosis not present

## 2018-01-21 LAB — ABO/RH: ABO/RH(D): O POS

## 2018-01-21 LAB — PREPARE RBC (CROSSMATCH)

## 2018-01-21 LAB — HEMOGLOBIN: Hemoglobin: 8.6 g/dL — ABNORMAL LOW (ref 13.0–18.0)

## 2018-01-21 MED ORDER — SODIUM CHLORIDE 0.9 % IV SOLN: Freq: Once | INTRAVENOUS | Status: DC

## 2018-01-21 MED ORDER — SODIUM CHLORIDE FLUSH 0.9 % IV SOLN
INTRAVENOUS | Status: AC
  Filled 2018-01-21: qty 10

## 2018-01-22 LAB — TYPE AND SCREEN
ABO/RH(D): O POS
ANTIBODY SCREEN: NEGATIVE
Unit division: 0

## 2018-01-22 LAB — BPAM RBC
BLOOD PRODUCT EXPIRATION DATE: 201902282359
ISSUE DATE / TIME: 201902011523
UNIT TYPE AND RH: 5100

## 2018-01-24 DIAGNOSIS — Z992 Dependence on renal dialysis: Secondary | ICD-10-CM | POA: Diagnosis not present

## 2018-01-24 DIAGNOSIS — N186 End stage renal disease: Secondary | ICD-10-CM | POA: Diagnosis not present

## 2018-01-24 DIAGNOSIS — D509 Iron deficiency anemia, unspecified: Secondary | ICD-10-CM | POA: Diagnosis not present

## 2018-01-24 DIAGNOSIS — N2581 Secondary hyperparathyroidism of renal origin: Secondary | ICD-10-CM | POA: Diagnosis not present

## 2018-01-24 DIAGNOSIS — D631 Anemia in chronic kidney disease: Secondary | ICD-10-CM | POA: Diagnosis not present

## 2018-01-26 DIAGNOSIS — D509 Iron deficiency anemia, unspecified: Secondary | ICD-10-CM | POA: Diagnosis not present

## 2018-01-26 DIAGNOSIS — Z992 Dependence on renal dialysis: Secondary | ICD-10-CM | POA: Diagnosis not present

## 2018-01-26 DIAGNOSIS — N2581 Secondary hyperparathyroidism of renal origin: Secondary | ICD-10-CM | POA: Diagnosis not present

## 2018-01-26 DIAGNOSIS — D631 Anemia in chronic kidney disease: Secondary | ICD-10-CM | POA: Diagnosis not present

## 2018-01-26 DIAGNOSIS — N186 End stage renal disease: Secondary | ICD-10-CM | POA: Diagnosis not present

## 2018-01-28 DIAGNOSIS — D631 Anemia in chronic kidney disease: Secondary | ICD-10-CM | POA: Diagnosis not present

## 2018-01-28 DIAGNOSIS — N186 End stage renal disease: Secondary | ICD-10-CM | POA: Diagnosis not present

## 2018-01-28 DIAGNOSIS — Z992 Dependence on renal dialysis: Secondary | ICD-10-CM | POA: Diagnosis not present

## 2018-01-28 DIAGNOSIS — D509 Iron deficiency anemia, unspecified: Secondary | ICD-10-CM | POA: Diagnosis not present

## 2018-01-28 DIAGNOSIS — N2581 Secondary hyperparathyroidism of renal origin: Secondary | ICD-10-CM | POA: Diagnosis not present

## 2018-01-31 DIAGNOSIS — Z992 Dependence on renal dialysis: Secondary | ICD-10-CM | POA: Diagnosis not present

## 2018-01-31 DIAGNOSIS — N2581 Secondary hyperparathyroidism of renal origin: Secondary | ICD-10-CM | POA: Diagnosis not present

## 2018-01-31 DIAGNOSIS — N186 End stage renal disease: Secondary | ICD-10-CM | POA: Diagnosis not present

## 2018-01-31 DIAGNOSIS — D631 Anemia in chronic kidney disease: Secondary | ICD-10-CM | POA: Diagnosis not present

## 2018-01-31 DIAGNOSIS — D509 Iron deficiency anemia, unspecified: Secondary | ICD-10-CM | POA: Diagnosis not present

## 2018-02-02 DIAGNOSIS — D509 Iron deficiency anemia, unspecified: Secondary | ICD-10-CM | POA: Diagnosis not present

## 2018-02-02 DIAGNOSIS — D631 Anemia in chronic kidney disease: Secondary | ICD-10-CM | POA: Diagnosis not present

## 2018-02-02 DIAGNOSIS — N2581 Secondary hyperparathyroidism of renal origin: Secondary | ICD-10-CM | POA: Diagnosis not present

## 2018-02-02 DIAGNOSIS — N186 End stage renal disease: Secondary | ICD-10-CM | POA: Diagnosis not present

## 2018-02-02 DIAGNOSIS — Z992 Dependence on renal dialysis: Secondary | ICD-10-CM | POA: Diagnosis not present

## 2018-02-03 DIAGNOSIS — E083513 Diabetes mellitus due to underlying condition with proliferative diabetic retinopathy with macular edema, bilateral: Secondary | ICD-10-CM | POA: Diagnosis not present

## 2018-02-03 DIAGNOSIS — N186 End stage renal disease: Secondary | ICD-10-CM | POA: Diagnosis not present

## 2018-02-03 DIAGNOSIS — D631 Anemia in chronic kidney disease: Secondary | ICD-10-CM | POA: Diagnosis not present

## 2018-02-03 DIAGNOSIS — I272 Pulmonary hypertension, unspecified: Secondary | ICD-10-CM | POA: Diagnosis not present

## 2018-02-03 DIAGNOSIS — E1142 Type 2 diabetes mellitus with diabetic polyneuropathy: Secondary | ICD-10-CM | POA: Diagnosis not present

## 2018-02-03 DIAGNOSIS — I5022 Chronic systolic (congestive) heart failure: Secondary | ICD-10-CM | POA: Diagnosis not present

## 2018-02-03 DIAGNOSIS — I255 Ischemic cardiomyopathy: Secondary | ICD-10-CM | POA: Diagnosis not present

## 2018-02-03 DIAGNOSIS — I771 Stricture of artery: Secondary | ICD-10-CM | POA: Diagnosis not present

## 2018-02-03 DIAGNOSIS — I10 Essential (primary) hypertension: Secondary | ICD-10-CM | POA: Diagnosis not present

## 2018-02-03 DIAGNOSIS — E1165 Type 2 diabetes mellitus with hyperglycemia: Secondary | ICD-10-CM | POA: Diagnosis not present

## 2018-02-03 DIAGNOSIS — Z992 Dependence on renal dialysis: Secondary | ICD-10-CM | POA: Diagnosis not present

## 2018-02-03 DIAGNOSIS — I2510 Atherosclerotic heart disease of native coronary artery without angina pectoris: Secondary | ICD-10-CM | POA: Diagnosis not present

## 2018-02-03 DIAGNOSIS — Z87891 Personal history of nicotine dependence: Secondary | ICD-10-CM | POA: Diagnosis not present

## 2018-02-04 DIAGNOSIS — D631 Anemia in chronic kidney disease: Secondary | ICD-10-CM | POA: Diagnosis not present

## 2018-02-04 DIAGNOSIS — N2581 Secondary hyperparathyroidism of renal origin: Secondary | ICD-10-CM | POA: Diagnosis not present

## 2018-02-04 DIAGNOSIS — Z992 Dependence on renal dialysis: Secondary | ICD-10-CM | POA: Diagnosis not present

## 2018-02-04 DIAGNOSIS — D509 Iron deficiency anemia, unspecified: Secondary | ICD-10-CM | POA: Diagnosis not present

## 2018-02-04 DIAGNOSIS — N186 End stage renal disease: Secondary | ICD-10-CM | POA: Diagnosis not present

## 2018-02-07 DIAGNOSIS — N2581 Secondary hyperparathyroidism of renal origin: Secondary | ICD-10-CM | POA: Diagnosis not present

## 2018-02-07 DIAGNOSIS — Z992 Dependence on renal dialysis: Secondary | ICD-10-CM | POA: Diagnosis not present

## 2018-02-07 DIAGNOSIS — D509 Iron deficiency anemia, unspecified: Secondary | ICD-10-CM | POA: Diagnosis not present

## 2018-02-07 DIAGNOSIS — D631 Anemia in chronic kidney disease: Secondary | ICD-10-CM | POA: Diagnosis not present

## 2018-02-07 DIAGNOSIS — N186 End stage renal disease: Secondary | ICD-10-CM | POA: Diagnosis not present

## 2018-02-09 DIAGNOSIS — D631 Anemia in chronic kidney disease: Secondary | ICD-10-CM | POA: Diagnosis not present

## 2018-02-09 DIAGNOSIS — N186 End stage renal disease: Secondary | ICD-10-CM | POA: Diagnosis not present

## 2018-02-09 DIAGNOSIS — Z992 Dependence on renal dialysis: Secondary | ICD-10-CM | POA: Diagnosis not present

## 2018-02-09 DIAGNOSIS — N2581 Secondary hyperparathyroidism of renal origin: Secondary | ICD-10-CM | POA: Diagnosis not present

## 2018-02-09 DIAGNOSIS — D509 Iron deficiency anemia, unspecified: Secondary | ICD-10-CM | POA: Diagnosis not present

## 2018-02-11 DIAGNOSIS — Z992 Dependence on renal dialysis: Secondary | ICD-10-CM | POA: Diagnosis not present

## 2018-02-11 DIAGNOSIS — N186 End stage renal disease: Secondary | ICD-10-CM | POA: Diagnosis not present

## 2018-02-11 DIAGNOSIS — D631 Anemia in chronic kidney disease: Secondary | ICD-10-CM | POA: Diagnosis not present

## 2018-02-11 DIAGNOSIS — D509 Iron deficiency anemia, unspecified: Secondary | ICD-10-CM | POA: Diagnosis not present

## 2018-02-11 DIAGNOSIS — N2581 Secondary hyperparathyroidism of renal origin: Secondary | ICD-10-CM | POA: Diagnosis not present

## 2018-02-14 DIAGNOSIS — D509 Iron deficiency anemia, unspecified: Secondary | ICD-10-CM | POA: Diagnosis not present

## 2018-02-14 DIAGNOSIS — Z992 Dependence on renal dialysis: Secondary | ICD-10-CM | POA: Diagnosis not present

## 2018-02-14 DIAGNOSIS — N186 End stage renal disease: Secondary | ICD-10-CM | POA: Diagnosis not present

## 2018-02-14 DIAGNOSIS — D631 Anemia in chronic kidney disease: Secondary | ICD-10-CM | POA: Diagnosis not present

## 2018-02-14 DIAGNOSIS — N2581 Secondary hyperparathyroidism of renal origin: Secondary | ICD-10-CM | POA: Diagnosis not present

## 2018-02-16 DIAGNOSIS — Z992 Dependence on renal dialysis: Secondary | ICD-10-CM | POA: Diagnosis not present

## 2018-02-16 DIAGNOSIS — D509 Iron deficiency anemia, unspecified: Secondary | ICD-10-CM | POA: Diagnosis not present

## 2018-02-16 DIAGNOSIS — D631 Anemia in chronic kidney disease: Secondary | ICD-10-CM | POA: Diagnosis not present

## 2018-02-16 DIAGNOSIS — N186 End stage renal disease: Secondary | ICD-10-CM | POA: Diagnosis not present

## 2018-02-16 DIAGNOSIS — N2581 Secondary hyperparathyroidism of renal origin: Secondary | ICD-10-CM | POA: Diagnosis not present

## 2018-02-17 DIAGNOSIS — N186 End stage renal disease: Secondary | ICD-10-CM | POA: Diagnosis not present

## 2018-02-17 DIAGNOSIS — Z992 Dependence on renal dialysis: Secondary | ICD-10-CM | POA: Diagnosis not present

## 2018-02-18 DIAGNOSIS — D509 Iron deficiency anemia, unspecified: Secondary | ICD-10-CM | POA: Diagnosis not present

## 2018-02-18 DIAGNOSIS — Z992 Dependence on renal dialysis: Secondary | ICD-10-CM | POA: Diagnosis not present

## 2018-02-18 DIAGNOSIS — N186 End stage renal disease: Secondary | ICD-10-CM | POA: Diagnosis not present

## 2018-02-18 DIAGNOSIS — D631 Anemia in chronic kidney disease: Secondary | ICD-10-CM | POA: Diagnosis not present

## 2018-02-18 DIAGNOSIS — N2581 Secondary hyperparathyroidism of renal origin: Secondary | ICD-10-CM | POA: Diagnosis not present

## 2018-02-21 DIAGNOSIS — D631 Anemia in chronic kidney disease: Secondary | ICD-10-CM | POA: Diagnosis not present

## 2018-02-21 DIAGNOSIS — Z992 Dependence on renal dialysis: Secondary | ICD-10-CM | POA: Diagnosis not present

## 2018-02-21 DIAGNOSIS — N2581 Secondary hyperparathyroidism of renal origin: Secondary | ICD-10-CM | POA: Diagnosis not present

## 2018-02-21 DIAGNOSIS — D509 Iron deficiency anemia, unspecified: Secondary | ICD-10-CM | POA: Diagnosis not present

## 2018-02-21 DIAGNOSIS — N186 End stage renal disease: Secondary | ICD-10-CM | POA: Diagnosis not present

## 2018-02-23 DIAGNOSIS — Z992 Dependence on renal dialysis: Secondary | ICD-10-CM | POA: Diagnosis not present

## 2018-02-23 DIAGNOSIS — D631 Anemia in chronic kidney disease: Secondary | ICD-10-CM | POA: Diagnosis not present

## 2018-02-23 DIAGNOSIS — N2581 Secondary hyperparathyroidism of renal origin: Secondary | ICD-10-CM | POA: Diagnosis not present

## 2018-02-23 DIAGNOSIS — N186 End stage renal disease: Secondary | ICD-10-CM | POA: Diagnosis not present

## 2018-02-23 DIAGNOSIS — D509 Iron deficiency anemia, unspecified: Secondary | ICD-10-CM | POA: Diagnosis not present

## 2018-02-25 DIAGNOSIS — D509 Iron deficiency anemia, unspecified: Secondary | ICD-10-CM | POA: Diagnosis not present

## 2018-02-25 DIAGNOSIS — N2581 Secondary hyperparathyroidism of renal origin: Secondary | ICD-10-CM | POA: Diagnosis not present

## 2018-02-25 DIAGNOSIS — D631 Anemia in chronic kidney disease: Secondary | ICD-10-CM | POA: Diagnosis not present

## 2018-02-25 DIAGNOSIS — N186 End stage renal disease: Secondary | ICD-10-CM | POA: Diagnosis not present

## 2018-02-25 DIAGNOSIS — Z992 Dependence on renal dialysis: Secondary | ICD-10-CM | POA: Diagnosis not present

## 2018-02-28 DIAGNOSIS — D509 Iron deficiency anemia, unspecified: Secondary | ICD-10-CM | POA: Diagnosis not present

## 2018-02-28 DIAGNOSIS — N2581 Secondary hyperparathyroidism of renal origin: Secondary | ICD-10-CM | POA: Diagnosis not present

## 2018-02-28 DIAGNOSIS — D631 Anemia in chronic kidney disease: Secondary | ICD-10-CM | POA: Diagnosis not present

## 2018-02-28 DIAGNOSIS — Z992 Dependence on renal dialysis: Secondary | ICD-10-CM | POA: Diagnosis not present

## 2018-02-28 DIAGNOSIS — N186 End stage renal disease: Secondary | ICD-10-CM | POA: Diagnosis not present

## 2018-03-01 DIAGNOSIS — N186 End stage renal disease: Secondary | ICD-10-CM | POA: Diagnosis not present

## 2018-03-01 DIAGNOSIS — I5022 Chronic systolic (congestive) heart failure: Secondary | ICD-10-CM | POA: Diagnosis not present

## 2018-03-01 DIAGNOSIS — D631 Anemia in chronic kidney disease: Secondary | ICD-10-CM | POA: Diagnosis not present

## 2018-03-01 DIAGNOSIS — I771 Stricture of artery: Secondary | ICD-10-CM | POA: Diagnosis not present

## 2018-03-01 DIAGNOSIS — IMO0001 Reserved for inherently not codable concepts without codable children: Secondary | ICD-10-CM | POA: Insufficient documentation

## 2018-03-01 DIAGNOSIS — I272 Pulmonary hypertension, unspecified: Secondary | ICD-10-CM | POA: Diagnosis not present

## 2018-03-01 DIAGNOSIS — I130 Hypertensive heart and chronic kidney disease with heart failure and stage 1 through stage 4 chronic kidney disease, or unspecified chronic kidney disease: Secondary | ICD-10-CM | POA: Diagnosis not present

## 2018-03-01 DIAGNOSIS — Z992 Dependence on renal dialysis: Secondary | ICD-10-CM | POA: Diagnosis not present

## 2018-03-01 DIAGNOSIS — G459 Transient cerebral ischemic attack, unspecified: Secondary | ICD-10-CM | POA: Diagnosis not present

## 2018-03-01 DIAGNOSIS — I255 Ischemic cardiomyopathy: Secondary | ICD-10-CM | POA: Diagnosis not present

## 2018-03-01 DIAGNOSIS — N189 Chronic kidney disease, unspecified: Secondary | ICD-10-CM | POA: Diagnosis not present

## 2018-03-01 DIAGNOSIS — I2510 Atherosclerotic heart disease of native coronary artery without angina pectoris: Secondary | ICD-10-CM | POA: Diagnosis not present

## 2018-03-02 DIAGNOSIS — N2581 Secondary hyperparathyroidism of renal origin: Secondary | ICD-10-CM | POA: Diagnosis not present

## 2018-03-02 DIAGNOSIS — N186 End stage renal disease: Secondary | ICD-10-CM | POA: Diagnosis not present

## 2018-03-02 DIAGNOSIS — Z992 Dependence on renal dialysis: Secondary | ICD-10-CM | POA: Diagnosis not present

## 2018-03-02 DIAGNOSIS — D631 Anemia in chronic kidney disease: Secondary | ICD-10-CM | POA: Diagnosis not present

## 2018-03-02 DIAGNOSIS — D509 Iron deficiency anemia, unspecified: Secondary | ICD-10-CM | POA: Diagnosis not present

## 2018-03-04 DIAGNOSIS — D509 Iron deficiency anemia, unspecified: Secondary | ICD-10-CM | POA: Diagnosis not present

## 2018-03-04 DIAGNOSIS — N186 End stage renal disease: Secondary | ICD-10-CM | POA: Diagnosis not present

## 2018-03-04 DIAGNOSIS — D631 Anemia in chronic kidney disease: Secondary | ICD-10-CM | POA: Diagnosis not present

## 2018-03-04 DIAGNOSIS — N2581 Secondary hyperparathyroidism of renal origin: Secondary | ICD-10-CM | POA: Diagnosis not present

## 2018-03-04 DIAGNOSIS — Z992 Dependence on renal dialysis: Secondary | ICD-10-CM | POA: Diagnosis not present

## 2018-03-07 DIAGNOSIS — Z992 Dependence on renal dialysis: Secondary | ICD-10-CM | POA: Diagnosis not present

## 2018-03-07 DIAGNOSIS — N186 End stage renal disease: Secondary | ICD-10-CM | POA: Diagnosis not present

## 2018-03-07 DIAGNOSIS — N2581 Secondary hyperparathyroidism of renal origin: Secondary | ICD-10-CM | POA: Diagnosis not present

## 2018-03-07 DIAGNOSIS — D509 Iron deficiency anemia, unspecified: Secondary | ICD-10-CM | POA: Diagnosis not present

## 2018-03-07 DIAGNOSIS — D631 Anemia in chronic kidney disease: Secondary | ICD-10-CM | POA: Diagnosis not present

## 2018-03-08 ENCOUNTER — Encounter: Payer: Self-pay | Admitting: Cardiovascular Disease

## 2018-03-08 ENCOUNTER — Ambulatory Visit (INDEPENDENT_AMBULATORY_CARE_PROVIDER_SITE_OTHER): Payer: Medicare Other | Admitting: Cardiovascular Disease

## 2018-03-08 VITALS — BP 130/60 | HR 62 | Ht 67.5 in | Wt 161.0 lb

## 2018-03-08 DIAGNOSIS — G459 Transient cerebral ischemic attack, unspecified: Secondary | ICD-10-CM | POA: Diagnosis not present

## 2018-03-08 DIAGNOSIS — I10 Essential (primary) hypertension: Secondary | ICD-10-CM | POA: Diagnosis not present

## 2018-03-08 DIAGNOSIS — N186 End stage renal disease: Secondary | ICD-10-CM | POA: Diagnosis not present

## 2018-03-08 DIAGNOSIS — I25118 Atherosclerotic heart disease of native coronary artery with other forms of angina pectoris: Secondary | ICD-10-CM

## 2018-03-08 DIAGNOSIS — I2510 Atherosclerotic heart disease of native coronary artery without angina pectoris: Secondary | ICD-10-CM | POA: Insufficient documentation

## 2018-03-08 DIAGNOSIS — E782 Mixed hyperlipidemia: Secondary | ICD-10-CM

## 2018-03-08 DIAGNOSIS — E785 Hyperlipidemia, unspecified: Secondary | ICD-10-CM | POA: Insufficient documentation

## 2018-03-08 NOTE — Patient Instructions (Signed)
Medication Instructions:   No medication changes made  Labwork:  No new labs needed  Testing/Procedures:  We will order an echocardiogram for CAD, prekidney transplant   Follow-Up: It was a pleasure seeing you in the office today. Please call us if you have new issues that need to be addressed before your next appt.  [phone]  Your physician wants you to follow-up in: 12 months as needed.  You will receive a reminder letter in the mail two months in advance. If you don't receive a letter, please call our office to schedule the follow-up appointment.  If you need a refill on your cardiac medications before your next appointment, please call your pharmacy.  For educational health videos Log in to : [email or web address] Or : [email or web address], password : [email or web address]

## 2018-03-08 NOTE — Progress Notes (Signed)
Cardiology Office Note  Date:  03/08/2018   ID:  Richard Zavala, DOB [birth year], MRN [id number]  PCP:  Midge Minium, PA   Chief Complaint  Patient presents with  . New Patient (Initial Visit)    referral from Dr Richard Zavala office. Patient is going back to transplant.     HPI:  [name] is a 57 year old gentleman with past medical history of esrd on HD  Anemia  Stroke several years ago CAD, 3 vessel cardiac catheterization performed at [facility] 2014 Medical management recommended DM Who presents by referral from Dr. Candiss Zavala for consultation of CAD, preop evaluation for transplant listing  He was told by Dr. Candiss Zavala that he needed repeat echocardiogram to get back on the transplant list  Denies any anginal symptoms, no significant shortness of breath with exertion Reports that he walks slow, today presents with a walker Hurt his back and knee, slowly getting better Typically does not need a walker to get into dialysis unit  Reports he was taken off the transplant list several years ago after his stroke Denies having any further strokes since that time Compliant with his Plavix and Lipitor  Prior history discussed with him Back in 2014 had no sx of chest pain, had "water weight" Leading to cardiac catheterization  EKG personally reviewed by myself on todays visit Shows normal sinus rhythm with rate 62 bpm no significant ST or T wave changes, baseline artifact noted  Previous reviews of echocardiograms 2015, 16, 17 and 18 show relatively normal ejection fraction  Echo 08/2014, EF 50% 08/2016 EF 60% Echo 07/2017  EF 55%  Carotid u/s Mild to moderate amount of calcified plaque bilaterally at the level of the carotid bulbs and proximal internal carotid arteries. Proximal ICA stenoses are estimated to be less than 50% bilaterally  Cardiac cath: 2014 Right Coronary Artery Prox RCA50%extendingDiffuse to Mid  RCA *Dist RCA 80% Diffuse *R PDA80% Discrete TIMI 0 *R PDA100%Discrete TIMI 0 Left Main L Main50% Discrete Left Circumflex *Prox LCX 70%extendingDiffuse to Mid LCX Mid LCX 30% Discrete Mid LCX 30% Discrete OM3(Small) Left Anterior Descending Mid LAD 50% Diffuse *Mid LAD90% Discrete Dist LAD50% Diffuse D2(Small)   PMH:   has a past medical history of Anemia, Blind, BPH (benign prostatic hyperplasia), CAD (coronary artery disease), CHF (congestive heart failure) (HCC), Chronic kidney disease (CKD), stage IV (severe) ([place]), Diabetes mellitus without complication ([place]), ESRD (end stage renal disease) ([place]), Hyperlipemia, Hypertension, Neuropathy, Osteoporosis, Pulmonary HTN ([place]), Stroke ([place]), TIA (transient ischemic attack), TRD (traction retinal detachment), and Wilms' tumor ([place]).  PSH:    Past Surgical History:  Procedure Laterality Date  . A/V FISTULAGRAM Left 01/28/2017   Procedure: A/V Fistulagram;  Surgeon: Algernon Huxley, MD;  Location: [place] [facility];  Service: Cardiovascular;  Laterality: Left;  . AV FISTULA PLACEMENT    . CARDIAC CATHETERIZATION    . CATARACT EXTRACTION W/PHACO Right 05/14/2016   Procedure: CATARACT EXTRACTION PHACO AND INTRAOCULAR LENS PLACEMENT (IOC);  Surgeon: Eulogio Bear, MD;  Location: [facility] ORS;  Service: Ophthalmology;  Laterality: Right;  [place] 1.46 AP% 11.5 CDE 12.33 FLUID PACK LOT # [id number] H  . EYE SURGERY    . FRACTURE SURGERY     RIGHT LEG WITH ROD  . HIP SURGERY    . NEPHRECTOMY RADICAL    . PERIPHERAL VASCULAR CATHETERIZATION N/A 08/28/2015   Procedure: A/V  Shuntogram/Fistulagram;  Surgeon: Algernon Huxley, MD;  Location: [place] [facility];  Service: Cardiovascular;  Laterality: N/A;  .  PERIPHERAL VASCULAR CATHETERIZATION Left 08/28/2015   Procedure: A/V Shunt Intervention;  Surgeon: Algernon Huxley, MD;  Location: [place] [facility];  Service: Cardiovascular;  Laterality: Left;    Current Outpatient Medications  Medication Sig Dispense Refill  . amLODipine (NORVASC) 10 MG tablet Take 1 tablet (10 mg total) by mouth at bedtime. 30 tablet 0  . atorvastatin (LIPITOR) 80 MG tablet Take 1 tablet (80 mg total) by mouth daily. (Patient taking differently: Take 80 mg by mouth at bedtime. ) 30 tablet 2  . Carboxymethylcellulose Sodium (THERATEARS OP) Place 1-2 drops into both eyes 3 (three) times daily as needed (for dry eyes).    . carvedilol (COREG) 12.5 MG tablet Take 37.5 mg by mouth 2 (two) times daily.    . clopidogrel (PLAVIX) 75 MG tablet Take 1 tablet (75 mg total) by mouth daily. (Patient taking differently: Take 75 mg by mouth See admin instructions. Saturday, Sunday, Tuesday & Thursday (Mornings)) 30 tablet 2  . furosemide (LASIX) 40 MG tablet Take 40 mg by mouth 2 (two) times daily.     . hydrALAZINE (APRESOLINE) 100 MG tablet Take 100 mg by mouth 3 (three) times daily.    . insulin glargine (LANTUS) 100 UNIT/ML injection Inject 6 Units into the skin daily at 12 noon.     . isosorbide mononitrate (IMDUR) 30 MG 24 hr tablet Take 1 tablet (30 mg total) by mouth daily. (Patient taking differently: Take 30 mg by mouth at bedtime. ) 30 tablet 0  . naproxen sodium (ANAPROX) 220 MG tablet Take 220 mg by mouth 2 (two) times daily as needed (for pain.).     No current facility-administered medications for this visit.      Allergies:   Metformin and Penicillins   Social History:  The patient  reports that he quit smoking about 5 years ago. His smoking use included cigarettes. He smoked 1.00 pack per day. he has never used smokeless tobacco. He reports  that he does not drink alcohol or use drugs.   Family History:   family history includes CAD in his father and paternal grandfather; COPD in his father; Diabetes in his maternal aunt and maternal uncle.    Review of Systems: Review of Systems  Constitutional: Negative.   Respiratory: Negative.   Cardiovascular: Negative.   Gastrointestinal: Negative.   Musculoskeletal: Negative.        Leg pain and weakness  Neurological: Negative.   Psychiatric/Behavioral: Negative.   All other systems reviewed and are negative.    PHYSICAL EXAM: VS:  BP 130/60 (BP Location: Left Arm, Patient Position: Sitting, Cuff Size: Normal)   Pulse 62   Ht 5' 7.5" (1.715 m)   Wt 161 lb (73 kg)   BMI 24.84 kg/m  , BMI Body mass index is 24.84 kg/m. GEN: Well nourished, well developed, in no acute distress  HEENT: normal  Neck: no JVD, carotid bruits, or masses Cardiac: RRR; no murmurs, rubs, or gallops,no edema  Respiratory:  clear to auscultation bilaterally, normal work of breathing GI: soft, nontender, nondistended, + BS MS: no deformity or atrophy  Skin: warm and dry, no rash Neuro:  Strength and sensation are intact Psych: euthymic mood, full affect    Recent Labs: 01/21/2018: Hemoglobin 8.6    Lipid Panel Lab Results  Component Value Date   CHOL 115 08/21/2016   HDL 41 08/21/2016   LDLCALC 54 08/21/2016   TRIG 102 08/21/2016      Wt Readings from Last 3 Encounters:  03/08/18 161 lb (73 kg)  01/28/17 175 lb (79.4 kg)  08/21/16 175 lb (79.4 kg)       ASSESSMENT AND PLAN:  Coronary artery disease of native artery of native heart with stable angina pectoris ([place]) - Plan: EKG 12-Lead Denies any symptoms concerning for angina Echocardiogram has been ordered at his request for transplant listing, at request of primary care and nephrology Three-vessel coronary disease by cardiac catheterization 2014 Stressed importance of aggressive diabetes control, lipid management Suggested he  call us for any worsening shortness of breath or chest pain He would likely need repeat cardiac catheterization prior to listing on transplant list given severity of his three-vessel disease.  End stage renal disease ([place]) On hemodialysis Reports he is trying to get on transplant list Was previously listed, taken off in the past for TIA/stroke  TIA (transient ischemic attack) Etiology unclear, mild to moderate carotid disease noted in the past On Plavix with statin  Mixed hyperlipidemia Cholesterol close to goal, LDL less than 70  Essential hypertension - Plan: EKG 12-Lead Blood pressure is well controlled on today's visit. No changes made to the medications.  Carotid stenosis Mild to moderate disease noted several years ago On Plavix and statin  Disposition:   F/U as needed He is managed by Dr. Katy Apo at [facility] last seen February 03, 2018 We will refer him back to his office for further evaluation He might need cardiac catheterization through his office for re-listing on transplant list   Total encounter time more than 60 minutes  Greater than 50% was spent in counseling and coordination of care with the patient    Orders Placed This Encounter  Procedures  . EKG 12-Lead     Signed, Esmond Plants, M.D., Ph.D. 03/08/2018  [place], [place]

## 2018-03-09 DIAGNOSIS — N2581 Secondary hyperparathyroidism of renal origin: Secondary | ICD-10-CM | POA: Diagnosis not present

## 2018-03-09 DIAGNOSIS — N186 End stage renal disease: Secondary | ICD-10-CM | POA: Diagnosis not present

## 2018-03-09 DIAGNOSIS — D631 Anemia in chronic kidney disease: Secondary | ICD-10-CM | POA: Diagnosis not present

## 2018-03-09 DIAGNOSIS — Z992 Dependence on renal dialysis: Secondary | ICD-10-CM | POA: Diagnosis not present

## 2018-03-09 DIAGNOSIS — D509 Iron deficiency anemia, unspecified: Secondary | ICD-10-CM | POA: Diagnosis not present

## 2018-03-11 DIAGNOSIS — D631 Anemia in chronic kidney disease: Secondary | ICD-10-CM | POA: Diagnosis not present

## 2018-03-11 DIAGNOSIS — N186 End stage renal disease: Secondary | ICD-10-CM | POA: Diagnosis not present

## 2018-03-11 DIAGNOSIS — D509 Iron deficiency anemia, unspecified: Secondary | ICD-10-CM | POA: Diagnosis not present

## 2018-03-11 DIAGNOSIS — Z992 Dependence on renal dialysis: Secondary | ICD-10-CM | POA: Diagnosis not present

## 2018-03-11 DIAGNOSIS — N2581 Secondary hyperparathyroidism of renal origin: Secondary | ICD-10-CM | POA: Diagnosis not present

## 2018-03-14 DIAGNOSIS — N186 End stage renal disease: Secondary | ICD-10-CM | POA: Diagnosis not present

## 2018-03-14 DIAGNOSIS — N2581 Secondary hyperparathyroidism of renal origin: Secondary | ICD-10-CM | POA: Diagnosis not present

## 2018-03-14 DIAGNOSIS — Z992 Dependence on renal dialysis: Secondary | ICD-10-CM | POA: Diagnosis not present

## 2018-03-14 DIAGNOSIS — D631 Anemia in chronic kidney disease: Secondary | ICD-10-CM | POA: Diagnosis not present

## 2018-03-14 DIAGNOSIS — D509 Iron deficiency anemia, unspecified: Secondary | ICD-10-CM | POA: Diagnosis not present

## 2018-03-15 DIAGNOSIS — N185 Chronic kidney disease, stage 5: Secondary | ICD-10-CM | POA: Diagnosis not present

## 2018-03-15 DIAGNOSIS — E1122 Type 2 diabetes mellitus with diabetic chronic kidney disease: Secondary | ICD-10-CM | POA: Diagnosis not present

## 2018-03-15 DIAGNOSIS — I132 Hypertensive heart and chronic kidney disease with heart failure and with stage 5 chronic kidney disease, or end stage renal disease: Secondary | ICD-10-CM | POA: Diagnosis not present

## 2018-03-15 DIAGNOSIS — Z85528 Personal history of other malignant neoplasm of kidney: Secondary | ICD-10-CM | POA: Diagnosis not present

## 2018-03-15 DIAGNOSIS — I5022 Chronic systolic (congestive) heart failure: Secondary | ICD-10-CM | POA: Diagnosis not present

## 2018-03-15 DIAGNOSIS — Z86010 Personal history of colonic polyps: Secondary | ICD-10-CM | POA: Diagnosis not present

## 2018-03-15 DIAGNOSIS — Z8673 Personal history of transient ischemic attack (TIA), and cerebral infarction without residual deficits: Secondary | ICD-10-CM | POA: Diagnosis not present

## 2018-03-15 DIAGNOSIS — Z87891 Personal history of nicotine dependence: Secondary | ICD-10-CM | POA: Diagnosis not present

## 2018-03-15 DIAGNOSIS — Z79899 Other long term (current) drug therapy: Secondary | ICD-10-CM | POA: Diagnosis not present

## 2018-03-15 DIAGNOSIS — Z880 Allergy status to penicillin: Secondary | ICD-10-CM | POA: Diagnosis not present

## 2018-03-15 DIAGNOSIS — Z538 Procedure and treatment not carried out for other reasons: Secondary | ICD-10-CM | POA: Diagnosis not present

## 2018-03-15 DIAGNOSIS — Z1211 Encounter for screening for malignant neoplasm of colon: Secondary | ICD-10-CM | POA: Diagnosis not present

## 2018-03-15 DIAGNOSIS — Z905 Acquired absence of kidney: Secondary | ICD-10-CM | POA: Diagnosis not present

## 2018-03-15 DIAGNOSIS — K635 Polyp of colon: Secondary | ICD-10-CM | POA: Diagnosis not present

## 2018-03-15 DIAGNOSIS — Z794 Long term (current) use of insulin: Secondary | ICD-10-CM | POA: Diagnosis not present

## 2018-03-16 DIAGNOSIS — D509 Iron deficiency anemia, unspecified: Secondary | ICD-10-CM | POA: Diagnosis not present

## 2018-03-16 DIAGNOSIS — Z992 Dependence on renal dialysis: Secondary | ICD-10-CM | POA: Diagnosis not present

## 2018-03-16 DIAGNOSIS — N2581 Secondary hyperparathyroidism of renal origin: Secondary | ICD-10-CM | POA: Diagnosis not present

## 2018-03-16 DIAGNOSIS — N186 End stage renal disease: Secondary | ICD-10-CM | POA: Diagnosis not present

## 2018-03-16 DIAGNOSIS — D631 Anemia in chronic kidney disease: Secondary | ICD-10-CM | POA: Diagnosis not present

## 2018-03-18 DIAGNOSIS — N186 End stage renal disease: Secondary | ICD-10-CM | POA: Diagnosis not present

## 2018-03-18 DIAGNOSIS — Z992 Dependence on renal dialysis: Secondary | ICD-10-CM | POA: Diagnosis not present

## 2018-03-18 DIAGNOSIS — D631 Anemia in chronic kidney disease: Secondary | ICD-10-CM | POA: Diagnosis not present

## 2018-03-18 DIAGNOSIS — N2581 Secondary hyperparathyroidism of renal origin: Secondary | ICD-10-CM | POA: Diagnosis not present

## 2018-03-18 DIAGNOSIS — D509 Iron deficiency anemia, unspecified: Secondary | ICD-10-CM | POA: Diagnosis not present

## 2018-03-20 DIAGNOSIS — N186 End stage renal disease: Secondary | ICD-10-CM | POA: Diagnosis not present

## 2018-03-20 DIAGNOSIS — Z992 Dependence on renal dialysis: Secondary | ICD-10-CM | POA: Diagnosis not present

## 2018-03-21 ENCOUNTER — Telehealth: Payer: Self-pay | Admitting: Cardiovascular Disease

## 2018-03-21 DIAGNOSIS — D509 Iron deficiency anemia, unspecified: Secondary | ICD-10-CM | POA: Diagnosis not present

## 2018-03-21 DIAGNOSIS — D631 Anemia in chronic kidney disease: Secondary | ICD-10-CM | POA: Diagnosis not present

## 2018-03-21 DIAGNOSIS — N2581 Secondary hyperparathyroidism of renal origin: Secondary | ICD-10-CM | POA: Diagnosis not present

## 2018-03-21 DIAGNOSIS — Z992 Dependence on renal dialysis: Secondary | ICD-10-CM | POA: Diagnosis not present

## 2018-03-21 DIAGNOSIS — N186 End stage renal disease: Secondary | ICD-10-CM | POA: Diagnosis not present

## 2018-03-21 NOTE — Telephone Encounter (Signed)
Dr Candiss Norse from [facility] calling to let us know patient needs STRESS echo for transplant eval and was scheduled for an echo after seeing Dr. Rockey Situ. [facility] transplant letter scanned to reflect this request.    Please advise if order in epic can be changed to stress echo for rescheduling.

## 2018-03-21 NOTE — Telephone Encounter (Signed)
Reviewed the patient's chart. He is currently scheduled for a complete echo on 03/31/18. The patient saw Dr. Rockey Situ in the office on 03/08/18- per office note:   "Disposition:   F/U as needed He is managed by Dr. Katy Apo at [facility] last seen February 03, 2018 We will refer him back to his office for further evaluation He might need cardiac catheterization through his office for re-listing on transplant list"  To Dr. Rockey Situ to review.

## 2018-03-23 ENCOUNTER — Other Ambulatory Visit: Payer: Self-pay | Admitting: Cardiovascular Disease

## 2018-03-23 DIAGNOSIS — N186 End stage renal disease: Secondary | ICD-10-CM | POA: Diagnosis not present

## 2018-03-23 DIAGNOSIS — N2581 Secondary hyperparathyroidism of renal origin: Secondary | ICD-10-CM | POA: Diagnosis not present

## 2018-03-23 DIAGNOSIS — I25118 Atherosclerotic heart disease of native coronary artery with other forms of angina pectoris: Secondary | ICD-10-CM

## 2018-03-23 DIAGNOSIS — G459 Transient cerebral ischemic attack, unspecified: Secondary | ICD-10-CM

## 2018-03-23 DIAGNOSIS — D631 Anemia in chronic kidney disease: Secondary | ICD-10-CM | POA: Diagnosis not present

## 2018-03-23 DIAGNOSIS — D509 Iron deficiency anemia, unspecified: Secondary | ICD-10-CM | POA: Diagnosis not present

## 2018-03-23 DIAGNOSIS — Z992 Dependence on renal dialysis: Secondary | ICD-10-CM | POA: Diagnosis not present

## 2018-03-25 ENCOUNTER — Other Ambulatory Visit: Payer: Self-pay

## 2018-03-25 DIAGNOSIS — N186 End stage renal disease: Secondary | ICD-10-CM | POA: Diagnosis not present

## 2018-03-25 DIAGNOSIS — Z992 Dependence on renal dialysis: Secondary | ICD-10-CM | POA: Diagnosis not present

## 2018-03-25 DIAGNOSIS — Z01818 Encounter for other preprocedural examination: Secondary | ICD-10-CM

## 2018-03-25 DIAGNOSIS — D509 Iron deficiency anemia, unspecified: Secondary | ICD-10-CM | POA: Diagnosis not present

## 2018-03-25 DIAGNOSIS — N2581 Secondary hyperparathyroidism of renal origin: Secondary | ICD-10-CM | POA: Diagnosis not present

## 2018-03-25 DIAGNOSIS — D631 Anemia in chronic kidney disease: Secondary | ICD-10-CM | POA: Diagnosis not present

## 2018-03-25 NOTE — Telephone Encounter (Signed)
Would change the order from echo New order would be for echo and treadmill stress echo thx

## 2018-03-25 NOTE — Telephone Encounter (Signed)
No answer. Left message to call back.   

## 2018-03-25 NOTE — Progress Notes (Unsigned)
Stress echo ordered

## 2018-03-28 DIAGNOSIS — D509 Iron deficiency anemia, unspecified: Secondary | ICD-10-CM | POA: Diagnosis not present

## 2018-03-28 DIAGNOSIS — D631 Anemia in chronic kidney disease: Secondary | ICD-10-CM | POA: Diagnosis not present

## 2018-03-28 DIAGNOSIS — Z992 Dependence on renal dialysis: Secondary | ICD-10-CM | POA: Diagnosis not present

## 2018-03-28 DIAGNOSIS — N2581 Secondary hyperparathyroidism of renal origin: Secondary | ICD-10-CM | POA: Diagnosis not present

## 2018-03-28 DIAGNOSIS — N186 End stage renal disease: Secondary | ICD-10-CM | POA: Diagnosis not present

## 2018-03-28 NOTE — Telephone Encounter (Signed)
I left a message for the patient to call back at his home & cell #.

## 2018-03-30 DIAGNOSIS — Z992 Dependence on renal dialysis: Secondary | ICD-10-CM | POA: Diagnosis not present

## 2018-03-30 DIAGNOSIS — D631 Anemia in chronic kidney disease: Secondary | ICD-10-CM | POA: Diagnosis not present

## 2018-03-30 DIAGNOSIS — D509 Iron deficiency anemia, unspecified: Secondary | ICD-10-CM | POA: Diagnosis not present

## 2018-03-30 DIAGNOSIS — N2581 Secondary hyperparathyroidism of renal origin: Secondary | ICD-10-CM | POA: Diagnosis not present

## 2018-03-30 DIAGNOSIS — N186 End stage renal disease: Secondary | ICD-10-CM | POA: Diagnosis not present

## 2018-03-30 NOTE — Telephone Encounter (Signed)
The patient is scheduled for his echo and stress echo on 03/31/18.   I left a message for the patient to call back to go over instructions for his stress echo.

## 2018-03-31 ENCOUNTER — Ambulatory Visit (INDEPENDENT_AMBULATORY_CARE_PROVIDER_SITE_OTHER): Payer: Medicare Other

## 2018-03-31 ENCOUNTER — Other Ambulatory Visit: Payer: Self-pay

## 2018-03-31 ENCOUNTER — Ambulatory Visit: Payer: Medicare Other

## 2018-03-31 DIAGNOSIS — I25118 Atherosclerotic heart disease of native coronary artery with other forms of angina pectoris: Secondary | ICD-10-CM

## 2018-03-31 DIAGNOSIS — G459 Transient cerebral ischemic attack, unspecified: Secondary | ICD-10-CM | POA: Diagnosis not present

## 2018-03-31 NOTE — Telephone Encounter (Signed)
Would discuss with Nira Conn, she might have been reaching out to Dr. Katy Apo at [facility]  We can do a lexiscan myoview at [facility] Or Dr. Katy Apo can perform one at [facility]

## 2018-03-31 NOTE — Telephone Encounter (Addendum)
Patient in office today for echo and stress echo as part of pre-transplant evaluation.  He arrived accompanied by his son; patient in wheelchair. Discussed stress echo instructions and the amount of walking, speed and incline of the walk.  Patient attempted to walk towards the door to assess his ability prior to ambulate. He was unstable. Discussed safety issues and concerns with Dr. Rockey Situ who is agreeable to cancel stress echo. Patient and son notified and agreeable. Will route to Dr. Rockey Situ for consideration of discussion with Dr. Katy Apo, [facility] cardiologist.

## 2018-04-01 DIAGNOSIS — D631 Anemia in chronic kidney disease: Secondary | ICD-10-CM | POA: Diagnosis not present

## 2018-04-01 DIAGNOSIS — D509 Iron deficiency anemia, unspecified: Secondary | ICD-10-CM | POA: Diagnosis not present

## 2018-04-01 DIAGNOSIS — Z992 Dependence on renal dialysis: Secondary | ICD-10-CM | POA: Diagnosis not present

## 2018-04-01 DIAGNOSIS — N186 End stage renal disease: Secondary | ICD-10-CM | POA: Diagnosis not present

## 2018-04-01 DIAGNOSIS — N2581 Secondary hyperparathyroidism of renal origin: Secondary | ICD-10-CM | POA: Diagnosis not present

## 2018-04-01 NOTE — Telephone Encounter (Signed)
Left voicemail message on both home and cell number for patient to call back regarding pre-transplant testing.

## 2018-04-01 NOTE — Telephone Encounter (Signed)
Spoke with patient and he states that he continues to see Dr. Katy Apo at [facility]. Reviewed that we did echocardiogram but that any stress testing he may want to have done at Dr. Rinaldo Cloud office given that they are doing his pre-transplant testing. He was agreeable and had no further questions at this time. Advised for him to please call back if we can assist in anyway. He verbalized understanding of our conversation and had no further questions or concerns at this time.

## 2018-04-04 DIAGNOSIS — N2581 Secondary hyperparathyroidism of renal origin: Secondary | ICD-10-CM | POA: Diagnosis not present

## 2018-04-04 DIAGNOSIS — D509 Iron deficiency anemia, unspecified: Secondary | ICD-10-CM | POA: Diagnosis not present

## 2018-04-04 DIAGNOSIS — D631 Anemia in chronic kidney disease: Secondary | ICD-10-CM | POA: Diagnosis not present

## 2018-04-04 DIAGNOSIS — Z992 Dependence on renal dialysis: Secondary | ICD-10-CM | POA: Diagnosis not present

## 2018-04-04 DIAGNOSIS — N186 End stage renal disease: Secondary | ICD-10-CM | POA: Diagnosis not present

## 2018-04-06 DIAGNOSIS — Z992 Dependence on renal dialysis: Secondary | ICD-10-CM | POA: Diagnosis not present

## 2018-04-06 DIAGNOSIS — N2581 Secondary hyperparathyroidism of renal origin: Secondary | ICD-10-CM | POA: Diagnosis not present

## 2018-04-06 DIAGNOSIS — D509 Iron deficiency anemia, unspecified: Secondary | ICD-10-CM | POA: Diagnosis not present

## 2018-04-06 DIAGNOSIS — D631 Anemia in chronic kidney disease: Secondary | ICD-10-CM | POA: Diagnosis not present

## 2018-04-06 DIAGNOSIS — N186 End stage renal disease: Secondary | ICD-10-CM | POA: Diagnosis not present

## 2018-04-08 DIAGNOSIS — Z992 Dependence on renal dialysis: Secondary | ICD-10-CM | POA: Diagnosis not present

## 2018-04-08 DIAGNOSIS — N2581 Secondary hyperparathyroidism of renal origin: Secondary | ICD-10-CM | POA: Diagnosis not present

## 2018-04-08 DIAGNOSIS — D509 Iron deficiency anemia, unspecified: Secondary | ICD-10-CM | POA: Diagnosis not present

## 2018-04-08 DIAGNOSIS — N186 End stage renal disease: Secondary | ICD-10-CM | POA: Diagnosis not present

## 2018-04-08 DIAGNOSIS — D631 Anemia in chronic kidney disease: Secondary | ICD-10-CM | POA: Diagnosis not present

## 2018-04-11 DIAGNOSIS — D509 Iron deficiency anemia, unspecified: Secondary | ICD-10-CM | POA: Diagnosis not present

## 2018-04-11 DIAGNOSIS — Z794 Long term (current) use of insulin: Secondary | ICD-10-CM | POA: Diagnosis not present

## 2018-04-11 DIAGNOSIS — N186 End stage renal disease: Secondary | ICD-10-CM | POA: Diagnosis not present

## 2018-04-11 DIAGNOSIS — D631 Anemia in chronic kidney disease: Secondary | ICD-10-CM | POA: Diagnosis not present

## 2018-04-11 DIAGNOSIS — Z992 Dependence on renal dialysis: Secondary | ICD-10-CM | POA: Diagnosis not present

## 2018-04-11 DIAGNOSIS — E109 Type 1 diabetes mellitus without complications: Secondary | ICD-10-CM | POA: Diagnosis not present

## 2018-04-11 DIAGNOSIS — N2581 Secondary hyperparathyroidism of renal origin: Secondary | ICD-10-CM | POA: Diagnosis not present

## 2018-04-13 DIAGNOSIS — N2581 Secondary hyperparathyroidism of renal origin: Secondary | ICD-10-CM | POA: Diagnosis not present

## 2018-04-13 DIAGNOSIS — D509 Iron deficiency anemia, unspecified: Secondary | ICD-10-CM | POA: Diagnosis not present

## 2018-04-13 DIAGNOSIS — N186 End stage renal disease: Secondary | ICD-10-CM | POA: Diagnosis not present

## 2018-04-13 DIAGNOSIS — Z992 Dependence on renal dialysis: Secondary | ICD-10-CM | POA: Diagnosis not present

## 2018-04-13 DIAGNOSIS — D631 Anemia in chronic kidney disease: Secondary | ICD-10-CM | POA: Diagnosis not present

## 2018-04-15 ENCOUNTER — Encounter (INDEPENDENT_AMBULATORY_CARE_PROVIDER_SITE_OTHER): Payer: Self-pay

## 2018-04-15 DIAGNOSIS — D509 Iron deficiency anemia, unspecified: Secondary | ICD-10-CM | POA: Diagnosis not present

## 2018-04-15 DIAGNOSIS — N2581 Secondary hyperparathyroidism of renal origin: Secondary | ICD-10-CM | POA: Diagnosis not present

## 2018-04-15 DIAGNOSIS — N186 End stage renal disease: Secondary | ICD-10-CM | POA: Diagnosis not present

## 2018-04-15 DIAGNOSIS — D631 Anemia in chronic kidney disease: Secondary | ICD-10-CM | POA: Diagnosis not present

## 2018-04-15 DIAGNOSIS — Z992 Dependence on renal dialysis: Secondary | ICD-10-CM | POA: Diagnosis not present

## 2018-04-18 ENCOUNTER — Encounter (INDEPENDENT_AMBULATORY_CARE_PROVIDER_SITE_OTHER): Payer: Self-pay

## 2018-04-18 DIAGNOSIS — N186 End stage renal disease: Secondary | ICD-10-CM | POA: Diagnosis not present

## 2018-04-18 DIAGNOSIS — Z992 Dependence on renal dialysis: Secondary | ICD-10-CM | POA: Diagnosis not present

## 2018-04-18 DIAGNOSIS — D509 Iron deficiency anemia, unspecified: Secondary | ICD-10-CM | POA: Diagnosis not present

## 2018-04-18 DIAGNOSIS — D631 Anemia in chronic kidney disease: Secondary | ICD-10-CM | POA: Diagnosis not present

## 2018-04-18 DIAGNOSIS — N2581 Secondary hyperparathyroidism of renal origin: Secondary | ICD-10-CM | POA: Diagnosis not present

## 2018-04-19 DIAGNOSIS — N186 End stage renal disease: Secondary | ICD-10-CM | POA: Diagnosis not present

## 2018-04-19 DIAGNOSIS — Z992 Dependence on renal dialysis: Secondary | ICD-10-CM | POA: Diagnosis not present

## 2018-04-20 ENCOUNTER — Encounter: Payer: Self-pay | Admitting: *Deleted

## 2018-04-20 DIAGNOSIS — Z992 Dependence on renal dialysis: Secondary | ICD-10-CM | POA: Diagnosis not present

## 2018-04-20 DIAGNOSIS — N2581 Secondary hyperparathyroidism of renal origin: Secondary | ICD-10-CM | POA: Diagnosis not present

## 2018-04-20 DIAGNOSIS — D631 Anemia in chronic kidney disease: Secondary | ICD-10-CM | POA: Diagnosis not present

## 2018-04-20 DIAGNOSIS — D509 Iron deficiency anemia, unspecified: Secondary | ICD-10-CM | POA: Diagnosis not present

## 2018-04-20 DIAGNOSIS — N186 End stage renal disease: Secondary | ICD-10-CM | POA: Diagnosis not present

## 2018-04-21 DIAGNOSIS — Z794 Long term (current) use of insulin: Secondary | ICD-10-CM | POA: Diagnosis not present

## 2018-04-21 DIAGNOSIS — D122 Benign neoplasm of ascending colon: Secondary | ICD-10-CM | POA: Diagnosis not present

## 2018-04-21 DIAGNOSIS — D1779 Benign lipomatous neoplasm of other sites: Secondary | ICD-10-CM | POA: Diagnosis not present

## 2018-04-21 DIAGNOSIS — I272 Pulmonary hypertension, unspecified: Secondary | ICD-10-CM | POA: Diagnosis not present

## 2018-04-21 DIAGNOSIS — Z1211 Encounter for screening for malignant neoplasm of colon: Secondary | ICD-10-CM | POA: Diagnosis not present

## 2018-04-21 DIAGNOSIS — E11319 Type 2 diabetes mellitus with unspecified diabetic retinopathy without macular edema: Secondary | ICD-10-CM | POA: Diagnosis not present

## 2018-04-21 DIAGNOSIS — N189 Chronic kidney disease, unspecified: Secondary | ICD-10-CM | POA: Diagnosis not present

## 2018-04-21 DIAGNOSIS — D125 Benign neoplasm of sigmoid colon: Secondary | ICD-10-CM | POA: Diagnosis not present

## 2018-04-21 DIAGNOSIS — Z86010 Personal history of colonic polyps: Secondary | ICD-10-CM | POA: Diagnosis not present

## 2018-04-21 DIAGNOSIS — D126 Benign neoplasm of colon, unspecified: Secondary | ICD-10-CM | POA: Diagnosis not present

## 2018-04-21 DIAGNOSIS — D175 Benign lipomatous neoplasm of intra-abdominal organs: Secondary | ICD-10-CM | POA: Diagnosis not present

## 2018-04-21 DIAGNOSIS — K635 Polyp of colon: Secondary | ICD-10-CM | POA: Diagnosis not present

## 2018-04-21 DIAGNOSIS — D124 Benign neoplasm of descending colon: Secondary | ICD-10-CM | POA: Diagnosis not present

## 2018-04-21 DIAGNOSIS — I509 Heart failure, unspecified: Secondary | ICD-10-CM | POA: Diagnosis not present

## 2018-04-21 DIAGNOSIS — I130 Hypertensive heart and chronic kidney disease with heart failure and stage 1 through stage 4 chronic kidney disease, or unspecified chronic kidney disease: Secondary | ICD-10-CM | POA: Diagnosis not present

## 2018-04-21 DIAGNOSIS — D120 Benign neoplasm of cecum: Secondary | ICD-10-CM | POA: Diagnosis not present

## 2018-04-21 DIAGNOSIS — D123 Benign neoplasm of transverse colon: Secondary | ICD-10-CM | POA: Diagnosis not present

## 2018-04-21 DIAGNOSIS — Z992 Dependence on renal dialysis: Secondary | ICD-10-CM | POA: Diagnosis not present

## 2018-04-22 DIAGNOSIS — N186 End stage renal disease: Secondary | ICD-10-CM | POA: Diagnosis not present

## 2018-04-22 DIAGNOSIS — Z992 Dependence on renal dialysis: Secondary | ICD-10-CM | POA: Diagnosis not present

## 2018-04-22 DIAGNOSIS — D509 Iron deficiency anemia, unspecified: Secondary | ICD-10-CM | POA: Diagnosis not present

## 2018-04-22 DIAGNOSIS — N2581 Secondary hyperparathyroidism of renal origin: Secondary | ICD-10-CM | POA: Diagnosis not present

## 2018-04-22 DIAGNOSIS — D631 Anemia in chronic kidney disease: Secondary | ICD-10-CM | POA: Diagnosis not present

## 2018-04-25 ENCOUNTER — Other Ambulatory Visit (INDEPENDENT_AMBULATORY_CARE_PROVIDER_SITE_OTHER): Payer: Self-pay | Admitting: Vascular Surgery

## 2018-04-25 DIAGNOSIS — Z992 Dependence on renal dialysis: Secondary | ICD-10-CM | POA: Diagnosis not present

## 2018-04-25 DIAGNOSIS — N2581 Secondary hyperparathyroidism of renal origin: Secondary | ICD-10-CM | POA: Diagnosis not present

## 2018-04-25 DIAGNOSIS — N186 End stage renal disease: Secondary | ICD-10-CM | POA: Diagnosis not present

## 2018-04-25 DIAGNOSIS — D509 Iron deficiency anemia, unspecified: Secondary | ICD-10-CM | POA: Diagnosis not present

## 2018-04-25 DIAGNOSIS — D631 Anemia in chronic kidney disease: Secondary | ICD-10-CM | POA: Diagnosis not present

## 2018-04-27 DIAGNOSIS — Z992 Dependence on renal dialysis: Secondary | ICD-10-CM | POA: Diagnosis not present

## 2018-04-27 DIAGNOSIS — D631 Anemia in chronic kidney disease: Secondary | ICD-10-CM | POA: Diagnosis not present

## 2018-04-27 DIAGNOSIS — N186 End stage renal disease: Secondary | ICD-10-CM | POA: Diagnosis not present

## 2018-04-27 DIAGNOSIS — N2581 Secondary hyperparathyroidism of renal origin: Secondary | ICD-10-CM | POA: Diagnosis not present

## 2018-04-27 DIAGNOSIS — D509 Iron deficiency anemia, unspecified: Secondary | ICD-10-CM | POA: Diagnosis not present

## 2018-04-28 ENCOUNTER — Encounter: Payer: Self-pay | Admitting: Vascular Surgery

## 2018-04-28 ENCOUNTER — Ambulatory Visit
Admission: RE | Admit: 2018-04-28 | Discharge: 2018-04-28 | Disposition: A | Discharge status: Home / Self Care | Payer: Medicare Other | Source: Ambulatory Visit | Attending: Vascular Surgery | Admitting: Vascular Surgery

## 2018-04-28 ENCOUNTER — Encounter
Admission: RE | Disposition: A | Discharge status: Home / Self Care | Payer: Self-pay | Source: Ambulatory Visit | Attending: Vascular Surgery

## 2018-04-28 DIAGNOSIS — Z9841 Cataract extraction status, right eye: Secondary | ICD-10-CM | POA: Insufficient documentation

## 2018-04-28 DIAGNOSIS — N400 Enlarged prostate without lower urinary tract symptoms: Secondary | ICD-10-CM | POA: Insufficient documentation

## 2018-04-28 DIAGNOSIS — E1122 Type 2 diabetes mellitus with diabetic chronic kidney disease: Secondary | ICD-10-CM | POA: Diagnosis not present

## 2018-04-28 DIAGNOSIS — Z833 Family history of diabetes mellitus: Secondary | ICD-10-CM | POA: Diagnosis not present

## 2018-04-28 DIAGNOSIS — Z8249 Family history of ischemic heart disease and other diseases of the circulatory system: Secondary | ICD-10-CM | POA: Insufficient documentation

## 2018-04-28 DIAGNOSIS — H547 Unspecified visual loss: Secondary | ICD-10-CM | POA: Insufficient documentation

## 2018-04-28 DIAGNOSIS — T82858A Stenosis of vascular prosthetic devices, implants and grafts, initial encounter: Secondary | ICD-10-CM | POA: Insufficient documentation

## 2018-04-28 DIAGNOSIS — I2510 Atherosclerotic heart disease of native coronary artery without angina pectoris: Secondary | ICD-10-CM | POA: Insufficient documentation

## 2018-04-28 DIAGNOSIS — Z880 Allergy status to penicillin: Secondary | ICD-10-CM | POA: Diagnosis not present

## 2018-04-28 DIAGNOSIS — Z888 Allergy status to other drugs, medicaments and biological substances status: Secondary | ICD-10-CM | POA: Diagnosis not present

## 2018-04-28 DIAGNOSIS — Z992 Dependence on renal dialysis: Secondary | ICD-10-CM | POA: Diagnosis not present

## 2018-04-28 DIAGNOSIS — E119 Type 2 diabetes mellitus without complications: Secondary | ICD-10-CM | POA: Diagnosis not present

## 2018-04-28 DIAGNOSIS — N186 End stage renal disease: Secondary | ICD-10-CM | POA: Diagnosis not present

## 2018-04-28 DIAGNOSIS — Z85528 Personal history of other malignant neoplasm of kidney: Secondary | ICD-10-CM | POA: Diagnosis not present

## 2018-04-28 DIAGNOSIS — Z9889 Other specified postprocedural states: Secondary | ICD-10-CM | POA: Diagnosis not present

## 2018-04-28 DIAGNOSIS — I132 Hypertensive heart and chronic kidney disease with heart failure and with stage 5 chronic kidney disease, or end stage renal disease: Secondary | ICD-10-CM | POA: Insufficient documentation

## 2018-04-28 DIAGNOSIS — Y832 Surgical operation with anastomosis, bypass or graft as the cause of abnormal reaction of the patient, or of later complication, without mention of misadventure at the time of the procedure: Secondary | ICD-10-CM | POA: Insufficient documentation

## 2018-04-28 DIAGNOSIS — E785 Hyperlipidemia, unspecified: Secondary | ICD-10-CM | POA: Diagnosis not present

## 2018-04-28 DIAGNOSIS — Z905 Acquired absence of kidney: Secondary | ICD-10-CM | POA: Diagnosis not present

## 2018-04-28 DIAGNOSIS — E1140 Type 2 diabetes mellitus with diabetic neuropathy, unspecified: Secondary | ICD-10-CM | POA: Diagnosis not present

## 2018-04-28 DIAGNOSIS — Z8673 Personal history of transient ischemic attack (TIA), and cerebral infarction without residual deficits: Secondary | ICD-10-CM | POA: Insufficient documentation

## 2018-04-28 DIAGNOSIS — T82868A Thrombosis of vascular prosthetic devices, implants and grafts, initial encounter: Secondary | ICD-10-CM | POA: Diagnosis not present

## 2018-04-28 DIAGNOSIS — I272 Pulmonary hypertension, unspecified: Secondary | ICD-10-CM | POA: Diagnosis not present

## 2018-04-28 DIAGNOSIS — I10 Essential (primary) hypertension: Secondary | ICD-10-CM | POA: Diagnosis not present

## 2018-04-28 DIAGNOSIS — M810 Age-related osteoporosis without current pathological fracture: Secondary | ICD-10-CM | POA: Diagnosis not present

## 2018-04-28 DIAGNOSIS — I509 Heart failure, unspecified: Secondary | ICD-10-CM | POA: Diagnosis not present

## 2018-04-28 DIAGNOSIS — Z87891 Personal history of nicotine dependence: Secondary | ICD-10-CM | POA: Insufficient documentation

## 2018-04-28 HISTORY — PX: A/V FISTULAGRAM: CATH118298

## 2018-04-28 LAB — GLUCOSE, CAPILLARY: Glucose-Capillary: 137 mg/dL — ABNORMAL HIGH (ref 65–99)

## 2018-04-28 LAB — POTASSIUM (ARMC VASCULAR LAB ONLY): Potassium (ARMC vascular lab): 4 (ref 3.5–5.1)

## 2018-04-28 SURGERY — A/V FISTULAGRAM
Anesthesia: Moderate Sedation | Laterality: Left

## 2018-04-28 MED ORDER — CLINDAMYCIN PHOSPHATE 300 MG/50ML IV SOLN
300.0000 mg | Freq: Once | INTRAVENOUS | Status: AC
  Administered 2018-04-28: 300 mg via INTRAVENOUS

## 2018-04-28 MED ORDER — METHYLPREDNISOLONE SODIUM SUCC 125 MG IJ SOLR: 125.0000 mg | INTRAMUSCULAR | Status: DC | PRN

## 2018-04-28 MED ORDER — HEPARIN (PORCINE) IN NACL 1000-0.9 UT/500ML-% IV SOLN
INTRAVENOUS | Status: AC
  Filled 2018-04-28: qty 1000

## 2018-04-28 MED ORDER — MIDAZOLAM HCL 5 MG/5ML IJ SOLN
INTRAMUSCULAR | Status: AC
  Filled 2018-04-28: qty 5

## 2018-04-28 MED ORDER — FENTANYL CITRATE (PF) 100 MCG/2ML IJ SOLN
INTRAMUSCULAR | Status: DC | PRN
  Administered 2018-04-28: 25 ug via INTRAVENOUS
  Administered 2018-04-28: 50 ug via INTRAVENOUS
  Administered 2018-04-28 (×2): 25 ug via INTRAVENOUS

## 2018-04-28 MED ORDER — HEPARIN SODIUM (PORCINE) 1000 UNIT/ML IJ SOLN
INTRAMUSCULAR | Status: AC
  Filled 2018-04-28: qty 1

## 2018-04-28 MED ORDER — HEPARIN SODIUM (PORCINE) 1000 UNIT/ML IJ SOLN
INTRAMUSCULAR | Status: DC | PRN
  Administered 2018-04-28: 3000 [IU] via INTRAVENOUS

## 2018-04-28 MED ORDER — LIDOCAINE-EPINEPHRINE (PF) 1 %-1:200000 IJ SOLN
INTRAMUSCULAR | Status: AC
  Filled 2018-04-28: qty 30

## 2018-04-28 MED ORDER — HYDROMORPHONE HCL 1 MG/ML IJ SOLN: 1.0000 mg | Freq: Once | INTRAMUSCULAR | Status: DC | PRN

## 2018-04-28 MED ORDER — SODIUM CHLORIDE 0.9 % IV SOLN: INTRAVENOUS | Status: DC

## 2018-04-28 MED ORDER — ONDANSETRON HCL 4 MG/2ML IJ SOLN: 4.0000 mg | Freq: Four times a day (QID) | INTRAMUSCULAR | Status: DC | PRN

## 2018-04-28 MED ORDER — FENTANYL CITRATE (PF) 100 MCG/2ML IJ SOLN
INTRAMUSCULAR | Status: AC
  Filled 2018-04-28: qty 2

## 2018-04-28 MED ORDER — IOPAMIDOL (ISOVUE-300) INJECTION 61%
INTRAVENOUS | Status: DC | PRN
  Administered 2018-04-28: 25 mL via INTRAVENOUS

## 2018-04-28 MED ORDER — CLINDAMYCIN PHOSPHATE 300 MG/50ML IV SOLN
INTRAVENOUS | Status: AC
  Filled 2018-04-28: qty 50

## 2018-04-28 MED ORDER — MIDAZOLAM HCL 2 MG/2ML IJ SOLN
INTRAMUSCULAR | Status: DC | PRN
  Administered 2018-04-28 (×3): 1 mg via INTRAVENOUS
  Administered 2018-04-28: 2 mg via INTRAVENOUS

## 2018-04-28 MED ORDER — FAMOTIDINE 20 MG PO TABS: 40.0000 mg | ORAL_TABLET | ORAL | Status: DC | PRN

## 2018-04-28 SURGICAL SUPPLY — 11 items
BALLN LUTONIX AV 8X60X75 (BALLOONS) ×3
BALLN ULTRVRSE 9X60X75 (BALLOONS) ×3
BALLOON LUTONIX AV 8X60X75 (BALLOONS) ×1 IMPLANT
BALLOON ULTRVRSE 9X60X75 (BALLOONS) ×1 IMPLANT
CANNULA 5F STIFF (CANNULA) ×3 IMPLANT
COVER PROBE U/S 5X48 (MISCELLANEOUS) ×3 IMPLANT
DEVICE PRESTO INFLATION (MISCELLANEOUS) ×3 IMPLANT
PACK ANGIOGRAPHY (CUSTOM PROCEDURE TRAY) ×3 IMPLANT
SHEATH BRITE TIP 6FRX5.5 (SHEATH) ×3 IMPLANT
SUT MNCRL AB 4-0 PS2 18 (SUTURE) ×3 IMPLANT
WIRE MAGIC TOR.035 180C (WIRE) ×3 IMPLANT

## 2018-04-28 NOTE — H&P (Signed)
[facility] Admission History & Physical  MRN : [id number]  Richard Zavala is a 57 y.o. (January 13, 1961) male who presents with chief complaint of No chief complaint on file. Marland Kitchen  History of Present Illness: I am asked to evaluate the patient by the dialysis center. The patient was sent here because they were unable to achieve adequate dialysis this morning. Furthermore the Center states there is very poor thrill and bruit. The patient states there there have been increasing problems with the access, such as "pulling clots" during dialysis and prolonged bleeding after decannulation. The patient estimates these problems have been going on for several weeks. The patient is unaware of any other change.  Patient denies pain or tenderness overlying the access.  There is no pain with dialysis.  The patient denies hand pain or finger pain consistent with steal syndrome.   There have been past interventions or declots of this access.  The patient is not chronically hypotensive on dialysis.  Current Facility-Administered Medications  Medication Dose Route Frequency Provider Last Rate Last Dose  . 0.9 %  sodium chloride infusion   Intravenous Continuous Stegmayer, Kimberly A, PA-C      . clindamycin (CLEOCIN) 300 MG/50ML IVPB           . clindamycin (CLEOCIN) IVPB 300 mg  300 mg Intravenous Once Stegmayer, Kimberly A, PA-C      . famotidine (PEPCID) tablet 40 mg  40 mg Oral PRN Stegmayer, Kimberly A, PA-C      . fentaNYL (SUBLIMAZE) injection    PRN Algernon Huxley, MD   50 mcg at 04/28/18 0845  . HYDROmorphone (DILAUDID) injection 1 mg  1 mg Intravenous Once PRN Stegmayer, Kimberly A, PA-C      . methylPREDNISolone sodium succinate (SOLU-MEDROL) 125 mg/2 mL injection 125 mg  125 mg Intravenous PRN Stegmayer, Kimberly A, PA-C      . midazolam (VERSED) injection    PRN Algernon Huxley, MD   2 mg at 04/28/18 0845  . ondansetron (ZOFRAN) injection 4 mg  4 mg Intravenous Q6H PRN Stegmayer,  Janalyn Harder, PA-C        Past Medical History:  Diagnosis Date  . Anemia   . Blind   . BPH (benign prostatic hyperplasia)   . CAD (coronary artery disease)   . CHF (congestive heart failure) ([place])   . Chronic kidney disease (CKD), stage IV (severe) ([facility])    DIALYSIS  . Diabetes mellitus without complication ([place])   . ESRD (end stage renal disease) ([place])    Monday-Wednesday-Friday dialysis  . Hyperlipemia   . Hypertension   . Neuropathy   . Osteoporosis   . Pulmonary HTN ([place])   . Stroke [facility])    2013  . TIA (transient ischemic attack)   . TRD (traction retinal detachment)   . Wilms' tumor [facility])     Past Surgical History:  Procedure Laterality Date  . A/V FISTULAGRAM Left 01/28/2017   Procedure: A/V Fistulagram;  Surgeon: Algernon Huxley, MD;  Location: [place] [facility];  Service: Cardiovascular;  Laterality: Left;  . AV FISTULA PLACEMENT    . CARDIAC CATHETERIZATION    . CATARACT EXTRACTION W/PHACO Right 05/14/2016   Procedure: CATARACT EXTRACTION PHACO AND INTRAOCULAR LENS PLACEMENT (IOC);  Surgeon: Eulogio Bear, MD;  Location: [facility] ORS;  Service: Ophthalmology;  Laterality: Right;  [place] 1.46 AP% 11.5 CDE 12.33 FLUID PACK LOT # [id number]  . EYE SURGERY    . FRACTURE SURGERY  RIGHT LEG WITH ROD  . HIP SURGERY    . NEPHRECTOMY RADICAL    . PERIPHERAL VASCULAR CATHETERIZATION N/A 08/28/2015   Procedure: A/V Shuntogram/Fistulagram;  Surgeon: Algernon Huxley, MD;  Location: [place] [facility];  Service: Cardiovascular;  Laterality: N/A;  . PERIPHERAL VASCULAR CATHETERIZATION Left 08/28/2015   Procedure: A/V Shunt Intervention;  Surgeon: Algernon Huxley, MD;  Location: [place] [facility];  Service: Cardiovascular;  Laterality: Left;    Social History Social History   Tobacco Use  . Smoking status: Former Smoker    Packs/day: 1.00    Types: Cigarettes    Last attempt to quit: 06/26/2012    Years since quitting: 5.8  . Smokeless tobacco: Never Used  Substance Use Topics  .  Alcohol use: No  . Drug use: No    Family History Family History  Problem Relation Age of Onset  . CAD Father   . COPD Father   . CAD Paternal Grandfather   . Diabetes Maternal Aunt   . Diabetes Maternal Uncle     No family history of bleeding or clotting disorders, autoimmune disease or porphyria  Allergies  Allergen Reactions  . Metformin Other (See Comments)    Severe diarrhea   . Penicillins Hives and Other (See Comments)    Has patient had a PCN reaction causing immediate rash, facial/tongue/throat swelling, SOB or lightheadedness with hypotension:Yes Has patient had a PCN reaction causing severe rash involving mucus membranes or skin necrosis:Yes Has patient had a PCN reaction that required hospitalization:inpatient at the time of reaction. Has patient had a PCN reaction occurring within the last 10 years:No If all of the above answers are "NO", then may proceed with Cephalosporin use.      REVIEW OF SYSTEMS (Negative unless checked)  Constitutional: [] Weight loss  [] Fever  [] Chills Cardiac: [] Chest pain   [] Chest pressure   [x] Palpitations   [] Shortness of breath when laying flat   [] Shortness of breath at rest   [x] Shortness of breath with exertion. Vascular:  [] Pain in legs with walking   [] Pain in legs at rest   [] Pain in legs when laying flat   [] Claudication   [] Pain in feet when walking  [] Pain in feet at rest  [] Pain in feet when laying flat   [] History of DVT   [] Phlebitis   [] Swelling in legs   [] Varicose veins   [] Non-healing ulcers Pulmonary:   [] Uses home oxygen   [] Productive cough   [] Hemoptysis   [] Wheeze  [] COPD   [] Asthma Neurologic:  [] Dizziness  [] Blackouts   [] Seizures   [] History of stroke   [] History of TIA  [] Aphasia   [] Temporary blindness   [] Dysphagia   [] Weakness or numbness in arms   [] Weakness or numbness in legs Musculoskeletal:  [x] Arthritis   [] Joint swelling   [] Joint pain   [] Low back pain Hematologic:  [] Easy bruising  [] Easy bleeding    [] Hypercoagulable state   [] Anemic  [] Hepatitis Gastrointestinal:  [] Blood in stool   [] Vomiting blood  [x] Gastroesophageal reflux/heartburn   [] Difficulty swallowing. Genitourinary:  [x] Chronic kidney disease   [] Difficult urination  [] Frequent urination  [] Burning with urination   [] Blood in urine Skin:  [] Rashes   [] Ulcers   [] Wounds Psychological:  [] History of anxiety   []  History of major depression.  Physical Examination  Vitals:   04/28/18 0718 04/28/18 0842 04/28/18 0845  BP: 133/62    Pulse: 61    Resp: 14    Temp: 98.1 F (36.7 C)  TempSrc: Oral    SpO2: 95% 98% 98%  Weight: 161 lb (73 kg)    Height: 5' 7.5" (1.715 m)     Body mass index is 24.84 kg/m. Gen: WD/WN, NAD Head: Red Level/AT, No temporalis wasting.  Ear/Nose/Throat: Hearing grossly intact, nares w/o erythema or drainage, oropharynx w/o Erythema/Exudate,  Eyes: Conjunctiva clear, sclera non-icteric Neck: Trachea midline.  No JVD.  Pulmonary:  Good air movement, respirations not labored, no use of accessory muscles.  Cardiac: RRR, normal S1, S2. Vascular: thrill in left arm AVF Vessel Right Left  Radial Palpable Palpable    Musculoskeletal: M/S 5/5 throughout.  Extremities without ischemic changes.  No deformity or atrophy.  Neurologic: Sensation grossly intact in extremities.  Symmetrical.  Speech is fluent. Motor exam as listed above. Psychiatric: Judgment intact, Mood & affect appropriate for pt's clinical situation. Dermatologic: No rashes or ulcers noted.  No cellulitis or open wounds.    CBC Lab Results  Component Value Date   WBC 6.5 08/22/2016   HGB 8.6 (L) 01/21/2018   HCT 31.2 (L) 08/22/2016   MCV 91.6 08/22/2016   PLT 176 08/22/2016    BMET    Component Value Date/Time   NA 138 08/22/2016 0449   NA 141 10/28/2012 0543   K 2.9 (L) 08/22/2016 0449   K 5.2 (H) 10/28/2012 0543   CL 102 08/22/2016 0449   CL 111 (H) 10/28/2012 0543   CO2 29 08/22/2016 0449   CO2 22 10/28/2012 0543    GLUCOSE 84 08/22/2016 0449   GLUCOSE 151 (H) 10/28/2012 0543   BUN 30 (H) 08/22/2016 0449   BUN 27 (H) 10/28/2012 0543   CREATININE 5.17 (H) 08/22/2016 0449   CREATININE 1.52 (H) 10/28/2012 0543   CALCIUM 7.6 (L) 08/22/2016 0449   CALCIUM 8.0 (L) 10/28/2012 0543   GFRNONAA 12 (L) 08/22/2016 0449   GFRNONAA 53 (L) 10/28/2012 0543   GFRAA 13 (L) 08/22/2016 0449   GFRAA >60 10/28/2012 0543   CrCl cannot be calculated (Patient's most recent lab result is older than the maximum 21 days allowed.).  COAG Lab Results  Component Value Date   INR 1.08 08/21/2016    Radiology No results found.  Assessment/Plan 1.  Complication dialysis device with dysfunction AV access:  Patient's left arm dialysis access is malfunctioning. The patient will undergo angiography and correction of any problems using interventional techniques with the hope of restoring function to the access.  The risks and benefits were described to the patient.  All questions were answered.  The patient agrees to proceed with angiography and intervention. Potassium will be drawn to ensure that it is an appropriate level prior to performing intervention. 2.  End-stage renal disease requiring hemodialysis:  Patient will continue dialysis therapy without further interruption if a successful intervention is not achieved then a tunneled catheter will be placed. Dialysis has already been arranged. 3.  Hypertension:  Patient will continue medical management; nephrology is following no changes in oral medications. 4. Diabetes mellitus:  Glucose will be monitored and oral medications been held this morning once the patient has undergone the patient's procedure po intake will be reinitiated and again Accu-Cheks will be used to assess the blood glucose level and treat as needed. The patient will be restarted on the patient's usual hypoglycemic regime     [name], MD  04/28/2018 8:52 AM

## 2018-04-28 NOTE — Op Note (Signed)
[facility]    OPERATIVE NOTE   PROCEDURE: 1.   Left radiocephalic arteriovenous fistula cannulation under ultrasound guidance 2.   Left arm fistulagram including central venogram 3.   Percutaneous transluminal angioplasty of mid forearm cephalic vein with 8 mm diameter drug-coated and 9 mm diameter conventional angioplasty balloons  PRE-OPERATIVE DIAGNOSIS: 1. ESRD 2. Poorly functional left radiocephalic AVF  POST-OPERATIVE DIAGNOSIS: same as above   SURGEON: [name], MD  ANESTHESIA: local with MCS  ESTIMATED BLOOD LOSS: 3 cc  FINDING(S): 1. 70 to 75% narrowing in the cephalic vein between arterial and venous access sites.  Upper arm cephalic vein outflow predominant.  No other focal stenosis.  Central venous circulation widely patent.  SPECIMEN(S):  None  CONTRAST: 25 cc  FLUORO TIME: 1.1 minutes  MODERATE CONSCIOUS SEDATION TIME: Approximately 20 minutes with 5 mg of Versed and 125 Mcg of Fentanyl   INDICATIONS: Richard Zavala is a 57 y.o. male who presents with malfunctioning left radiocephalic arteriovenous fistula.  His transonic flows were low at dialysis.  The patient is scheduled for left arm fistulagram.  The patient is aware the risks include but are not limited to: bleeding, infection, thrombosis of the cannulated access, and possible anaphylactic reaction to the contrast.  The patient is aware of the risks of the procedure and elects to proceed forward.  DESCRIPTION: After full informed written consent was obtained, the patient was brought back to the angiography suite and placed supine upon the angiography table.  The patient was connected to monitoring equipment. Moderate conscious sedation was administered with a face to face encounter with the patient throughout the procedure with my supervision of the RN administering medicines and monitoring the patient's vital signs and mental status throughout from the start of the procedure until  the patient was taken to the recovery room. The left arm was prepped and draped in the standard fashion for a percutaneous access intervention.  Under ultrasound guidance, the left radiocephalic arteriovenous fistula was cannulated with a micropuncture needle under direct ultrasound guidance in an antegrade fashion at the arterial access site and a permanent image was performed.  The microwire was advanced into the fistula and the needle was exchanged for the a microsheath.  I then upsized to a 6 Fr Sheath and imaging was performed.  Hand injections were completed to image the access including the central venous system. This demonstrated 70 to 75% narrowing in the cephalic vein between arterial and venous access sites.  Upper arm cephalic vein outflow predominant.  No other focal stenosis.  Central venous circulation widely patent.  Based on the images, this patient will need intervention to this area. I then gave the patient 3000 units of intravenous heparin.  I then crossed the stenosis with a Magic Tourqe wire.  Based on the imaging, a 8 mm x 6 cm Lutonix drug-coated angioplasty balloon was selected.  The balloon was centered around the mid forearm cephalic vein stenosis and inflated to 12 ATM for 1 minute(s).  On completion imaging, a 50 % residual stenosis was present.  I then upsized to a 9 mm diameter by 6 cm length conventional angioplasty balloon and inflated this to 10 atm for 1 minute.  Completion angiogram showed about a 30% residual stenosis.   Based on the completion imaging, no further intervention is necessary.  The wire and balloon were removed from the sheath.  A 4-0 Monocryl purse-string suture was sewn around the sheath.  The sheath was  removed while tying down the suture.  A sterile bandage was applied to the puncture site.  COMPLICATIONS: None  CONDITION: Stable   [name]  04/28/2018 9:44 AM   This note was created with Dragon Medical transcription system. Any errors in  dictation are purely unintentional.

## 2018-04-28 NOTE — Progress Notes (Deleted)
Patient and wife had questions regarding home medication regimen. Discussed with Dr Rockey Situ and all questions were answered/clarified. Discharge instructions reviewed by Nigel Mormon. Patient alert and oriented. WIll discharge per order.

## 2018-04-28 NOTE — Progress Notes (Signed)
Dr. Lucky Cowboy at bedside speaking with pt. Pt. With blind left eye; noted weak right arm. Speech clear. Pt. States "i'm still a little drunk."

## 2018-04-29 DIAGNOSIS — D509 Iron deficiency anemia, unspecified: Secondary | ICD-10-CM | POA: Diagnosis not present

## 2018-04-29 DIAGNOSIS — D631 Anemia in chronic kidney disease: Secondary | ICD-10-CM | POA: Diagnosis not present

## 2018-04-29 DIAGNOSIS — N2581 Secondary hyperparathyroidism of renal origin: Secondary | ICD-10-CM | POA: Diagnosis not present

## 2018-04-29 DIAGNOSIS — N186 End stage renal disease: Secondary | ICD-10-CM | POA: Diagnosis not present

## 2018-04-29 DIAGNOSIS — Z992 Dependence on renal dialysis: Secondary | ICD-10-CM | POA: Diagnosis not present

## 2018-05-02 DIAGNOSIS — D631 Anemia in chronic kidney disease: Secondary | ICD-10-CM | POA: Diagnosis not present

## 2018-05-02 DIAGNOSIS — N186 End stage renal disease: Secondary | ICD-10-CM | POA: Diagnosis not present

## 2018-05-02 DIAGNOSIS — N2581 Secondary hyperparathyroidism of renal origin: Secondary | ICD-10-CM | POA: Diagnosis not present

## 2018-05-02 DIAGNOSIS — D509 Iron deficiency anemia, unspecified: Secondary | ICD-10-CM | POA: Diagnosis not present

## 2018-05-02 DIAGNOSIS — Z992 Dependence on renal dialysis: Secondary | ICD-10-CM | POA: Diagnosis not present

## 2018-05-04 DIAGNOSIS — D509 Iron deficiency anemia, unspecified: Secondary | ICD-10-CM | POA: Diagnosis not present

## 2018-05-04 DIAGNOSIS — D631 Anemia in chronic kidney disease: Secondary | ICD-10-CM | POA: Diagnosis not present

## 2018-05-04 DIAGNOSIS — Z992 Dependence on renal dialysis: Secondary | ICD-10-CM | POA: Diagnosis not present

## 2018-05-04 DIAGNOSIS — N186 End stage renal disease: Secondary | ICD-10-CM | POA: Diagnosis not present

## 2018-05-04 DIAGNOSIS — N2581 Secondary hyperparathyroidism of renal origin: Secondary | ICD-10-CM | POA: Diagnosis not present

## 2018-05-06 DIAGNOSIS — N2581 Secondary hyperparathyroidism of renal origin: Secondary | ICD-10-CM | POA: Diagnosis not present

## 2018-05-06 DIAGNOSIS — Z992 Dependence on renal dialysis: Secondary | ICD-10-CM | POA: Diagnosis not present

## 2018-05-06 DIAGNOSIS — D631 Anemia in chronic kidney disease: Secondary | ICD-10-CM | POA: Diagnosis not present

## 2018-05-06 DIAGNOSIS — D509 Iron deficiency anemia, unspecified: Secondary | ICD-10-CM | POA: Diagnosis not present

## 2018-05-06 DIAGNOSIS — N186 End stage renal disease: Secondary | ICD-10-CM | POA: Diagnosis not present

## 2018-05-09 DIAGNOSIS — N2581 Secondary hyperparathyroidism of renal origin: Secondary | ICD-10-CM | POA: Diagnosis not present

## 2018-05-09 DIAGNOSIS — Z992 Dependence on renal dialysis: Secondary | ICD-10-CM | POA: Diagnosis not present

## 2018-05-09 DIAGNOSIS — D631 Anemia in chronic kidney disease: Secondary | ICD-10-CM | POA: Diagnosis not present

## 2018-05-09 DIAGNOSIS — D509 Iron deficiency anemia, unspecified: Secondary | ICD-10-CM | POA: Diagnosis not present

## 2018-05-09 DIAGNOSIS — N186 End stage renal disease: Secondary | ICD-10-CM | POA: Diagnosis not present

## 2018-05-11 DIAGNOSIS — Z992 Dependence on renal dialysis: Secondary | ICD-10-CM | POA: Diagnosis not present

## 2018-05-11 DIAGNOSIS — N186 End stage renal disease: Secondary | ICD-10-CM | POA: Diagnosis not present

## 2018-05-11 DIAGNOSIS — N2581 Secondary hyperparathyroidism of renal origin: Secondary | ICD-10-CM | POA: Diagnosis not present

## 2018-05-11 DIAGNOSIS — D631 Anemia in chronic kidney disease: Secondary | ICD-10-CM | POA: Diagnosis not present

## 2018-05-11 DIAGNOSIS — D509 Iron deficiency anemia, unspecified: Secondary | ICD-10-CM | POA: Diagnosis not present

## 2018-05-13 DIAGNOSIS — N2581 Secondary hyperparathyroidism of renal origin: Secondary | ICD-10-CM | POA: Diagnosis not present

## 2018-05-13 DIAGNOSIS — N186 End stage renal disease: Secondary | ICD-10-CM | POA: Diagnosis not present

## 2018-05-13 DIAGNOSIS — D509 Iron deficiency anemia, unspecified: Secondary | ICD-10-CM | POA: Diagnosis not present

## 2018-05-13 DIAGNOSIS — Z992 Dependence on renal dialysis: Secondary | ICD-10-CM | POA: Diagnosis not present

## 2018-05-13 DIAGNOSIS — D631 Anemia in chronic kidney disease: Secondary | ICD-10-CM | POA: Diagnosis not present

## 2018-05-16 DIAGNOSIS — D509 Iron deficiency anemia, unspecified: Secondary | ICD-10-CM | POA: Diagnosis not present

## 2018-05-16 DIAGNOSIS — Z992 Dependence on renal dialysis: Secondary | ICD-10-CM | POA: Diagnosis not present

## 2018-05-16 DIAGNOSIS — N2581 Secondary hyperparathyroidism of renal origin: Secondary | ICD-10-CM | POA: Diagnosis not present

## 2018-05-16 DIAGNOSIS — D631 Anemia in chronic kidney disease: Secondary | ICD-10-CM | POA: Diagnosis not present

## 2018-05-16 DIAGNOSIS — N186 End stage renal disease: Secondary | ICD-10-CM | POA: Diagnosis not present

## 2018-05-18 DIAGNOSIS — N2581 Secondary hyperparathyroidism of renal origin: Secondary | ICD-10-CM | POA: Diagnosis not present

## 2018-05-18 DIAGNOSIS — D509 Iron deficiency anemia, unspecified: Secondary | ICD-10-CM | POA: Diagnosis not present

## 2018-05-18 DIAGNOSIS — N186 End stage renal disease: Secondary | ICD-10-CM | POA: Diagnosis not present

## 2018-05-18 DIAGNOSIS — D631 Anemia in chronic kidney disease: Secondary | ICD-10-CM | POA: Diagnosis not present

## 2018-05-18 DIAGNOSIS — Z992 Dependence on renal dialysis: Secondary | ICD-10-CM | POA: Diagnosis not present

## 2018-05-20 DIAGNOSIS — N2581 Secondary hyperparathyroidism of renal origin: Secondary | ICD-10-CM | POA: Diagnosis not present

## 2018-05-20 DIAGNOSIS — D509 Iron deficiency anemia, unspecified: Secondary | ICD-10-CM | POA: Diagnosis not present

## 2018-05-20 DIAGNOSIS — D631 Anemia in chronic kidney disease: Secondary | ICD-10-CM | POA: Diagnosis not present

## 2018-05-20 DIAGNOSIS — Z992 Dependence on renal dialysis: Secondary | ICD-10-CM | POA: Diagnosis not present

## 2018-05-20 DIAGNOSIS — N186 End stage renal disease: Secondary | ICD-10-CM | POA: Diagnosis not present

## 2018-05-23 DIAGNOSIS — N2581 Secondary hyperparathyroidism of renal origin: Secondary | ICD-10-CM | POA: Diagnosis not present

## 2018-05-23 DIAGNOSIS — D631 Anemia in chronic kidney disease: Secondary | ICD-10-CM | POA: Diagnosis not present

## 2018-05-23 DIAGNOSIS — D509 Iron deficiency anemia, unspecified: Secondary | ICD-10-CM | POA: Diagnosis not present

## 2018-05-23 DIAGNOSIS — Z992 Dependence on renal dialysis: Secondary | ICD-10-CM | POA: Diagnosis not present

## 2018-05-23 DIAGNOSIS — N186 End stage renal disease: Secondary | ICD-10-CM | POA: Diagnosis not present

## 2018-05-25 DIAGNOSIS — Z992 Dependence on renal dialysis: Secondary | ICD-10-CM | POA: Diagnosis not present

## 2018-05-25 DIAGNOSIS — D631 Anemia in chronic kidney disease: Secondary | ICD-10-CM | POA: Diagnosis not present

## 2018-05-25 DIAGNOSIS — N2581 Secondary hyperparathyroidism of renal origin: Secondary | ICD-10-CM | POA: Diagnosis not present

## 2018-05-25 DIAGNOSIS — D509 Iron deficiency anemia, unspecified: Secondary | ICD-10-CM | POA: Diagnosis not present

## 2018-05-25 DIAGNOSIS — N186 End stage renal disease: Secondary | ICD-10-CM | POA: Diagnosis not present

## 2018-05-27 DIAGNOSIS — D631 Anemia in chronic kidney disease: Secondary | ICD-10-CM | POA: Diagnosis not present

## 2018-05-27 DIAGNOSIS — N186 End stage renal disease: Secondary | ICD-10-CM | POA: Diagnosis not present

## 2018-05-27 DIAGNOSIS — D509 Iron deficiency anemia, unspecified: Secondary | ICD-10-CM | POA: Diagnosis not present

## 2018-05-27 DIAGNOSIS — Z992 Dependence on renal dialysis: Secondary | ICD-10-CM | POA: Diagnosis not present

## 2018-05-27 DIAGNOSIS — N2581 Secondary hyperparathyroidism of renal origin: Secondary | ICD-10-CM | POA: Diagnosis not present

## 2018-05-30 DIAGNOSIS — D631 Anemia in chronic kidney disease: Secondary | ICD-10-CM | POA: Diagnosis not present

## 2018-05-30 DIAGNOSIS — N2581 Secondary hyperparathyroidism of renal origin: Secondary | ICD-10-CM | POA: Diagnosis not present

## 2018-05-30 DIAGNOSIS — D509 Iron deficiency anemia, unspecified: Secondary | ICD-10-CM | POA: Diagnosis not present

## 2018-05-30 DIAGNOSIS — N186 End stage renal disease: Secondary | ICD-10-CM | POA: Diagnosis not present

## 2018-05-30 DIAGNOSIS — Z992 Dependence on renal dialysis: Secondary | ICD-10-CM | POA: Diagnosis not present

## 2018-06-01 DIAGNOSIS — D631 Anemia in chronic kidney disease: Secondary | ICD-10-CM | POA: Diagnosis not present

## 2018-06-01 DIAGNOSIS — D509 Iron deficiency anemia, unspecified: Secondary | ICD-10-CM | POA: Diagnosis not present

## 2018-06-01 DIAGNOSIS — N186 End stage renal disease: Secondary | ICD-10-CM | POA: Diagnosis not present

## 2018-06-01 DIAGNOSIS — Z992 Dependence on renal dialysis: Secondary | ICD-10-CM | POA: Diagnosis not present

## 2018-06-01 DIAGNOSIS — N2581 Secondary hyperparathyroidism of renal origin: Secondary | ICD-10-CM | POA: Diagnosis not present

## 2018-06-03 DIAGNOSIS — Z992 Dependence on renal dialysis: Secondary | ICD-10-CM | POA: Diagnosis not present

## 2018-06-03 DIAGNOSIS — D509 Iron deficiency anemia, unspecified: Secondary | ICD-10-CM | POA: Diagnosis not present

## 2018-06-03 DIAGNOSIS — N186 End stage renal disease: Secondary | ICD-10-CM | POA: Diagnosis not present

## 2018-06-03 DIAGNOSIS — N2581 Secondary hyperparathyroidism of renal origin: Secondary | ICD-10-CM | POA: Diagnosis not present

## 2018-06-03 DIAGNOSIS — D631 Anemia in chronic kidney disease: Secondary | ICD-10-CM | POA: Diagnosis not present

## 2018-06-06 DIAGNOSIS — D631 Anemia in chronic kidney disease: Secondary | ICD-10-CM | POA: Diagnosis not present

## 2018-06-06 DIAGNOSIS — N2581 Secondary hyperparathyroidism of renal origin: Secondary | ICD-10-CM | POA: Diagnosis not present

## 2018-06-06 DIAGNOSIS — Z992 Dependence on renal dialysis: Secondary | ICD-10-CM | POA: Diagnosis not present

## 2018-06-06 DIAGNOSIS — D509 Iron deficiency anemia, unspecified: Secondary | ICD-10-CM | POA: Diagnosis not present

## 2018-06-06 DIAGNOSIS — N186 End stage renal disease: Secondary | ICD-10-CM | POA: Diagnosis not present

## 2018-06-08 DIAGNOSIS — D631 Anemia in chronic kidney disease: Secondary | ICD-10-CM | POA: Diagnosis not present

## 2018-06-08 DIAGNOSIS — N2581 Secondary hyperparathyroidism of renal origin: Secondary | ICD-10-CM | POA: Diagnosis not present

## 2018-06-08 DIAGNOSIS — D509 Iron deficiency anemia, unspecified: Secondary | ICD-10-CM | POA: Diagnosis not present

## 2018-06-08 DIAGNOSIS — Z992 Dependence on renal dialysis: Secondary | ICD-10-CM | POA: Diagnosis not present

## 2018-06-08 DIAGNOSIS — N186 End stage renal disease: Secondary | ICD-10-CM | POA: Diagnosis not present

## 2018-06-10 DIAGNOSIS — D631 Anemia in chronic kidney disease: Secondary | ICD-10-CM | POA: Diagnosis not present

## 2018-06-10 DIAGNOSIS — N186 End stage renal disease: Secondary | ICD-10-CM | POA: Diagnosis not present

## 2018-06-10 DIAGNOSIS — Z992 Dependence on renal dialysis: Secondary | ICD-10-CM | POA: Diagnosis not present

## 2018-06-10 DIAGNOSIS — N2581 Secondary hyperparathyroidism of renal origin: Secondary | ICD-10-CM | POA: Diagnosis not present

## 2018-06-10 DIAGNOSIS — D509 Iron deficiency anemia, unspecified: Secondary | ICD-10-CM | POA: Diagnosis not present

## 2018-06-13 DIAGNOSIS — N2581 Secondary hyperparathyroidism of renal origin: Secondary | ICD-10-CM | POA: Diagnosis not present

## 2018-06-13 DIAGNOSIS — Z992 Dependence on renal dialysis: Secondary | ICD-10-CM | POA: Diagnosis not present

## 2018-06-13 DIAGNOSIS — D631 Anemia in chronic kidney disease: Secondary | ICD-10-CM | POA: Diagnosis not present

## 2018-06-13 DIAGNOSIS — N186 End stage renal disease: Secondary | ICD-10-CM | POA: Diagnosis not present

## 2018-06-13 DIAGNOSIS — D509 Iron deficiency anemia, unspecified: Secondary | ICD-10-CM | POA: Diagnosis not present

## 2018-06-15 DIAGNOSIS — D631 Anemia in chronic kidney disease: Secondary | ICD-10-CM | POA: Diagnosis not present

## 2018-06-15 DIAGNOSIS — N2581 Secondary hyperparathyroidism of renal origin: Secondary | ICD-10-CM | POA: Diagnosis not present

## 2018-06-15 DIAGNOSIS — N186 End stage renal disease: Secondary | ICD-10-CM | POA: Diagnosis not present

## 2018-06-15 DIAGNOSIS — Z992 Dependence on renal dialysis: Secondary | ICD-10-CM | POA: Diagnosis not present

## 2018-06-15 DIAGNOSIS — D509 Iron deficiency anemia, unspecified: Secondary | ICD-10-CM | POA: Diagnosis not present

## 2018-06-17 DIAGNOSIS — N186 End stage renal disease: Secondary | ICD-10-CM | POA: Diagnosis not present

## 2018-06-17 DIAGNOSIS — Z992 Dependence on renal dialysis: Secondary | ICD-10-CM | POA: Diagnosis not present

## 2018-06-17 DIAGNOSIS — D509 Iron deficiency anemia, unspecified: Secondary | ICD-10-CM | POA: Diagnosis not present

## 2018-06-17 DIAGNOSIS — D631 Anemia in chronic kidney disease: Secondary | ICD-10-CM | POA: Diagnosis not present

## 2018-06-17 DIAGNOSIS — N2581 Secondary hyperparathyroidism of renal origin: Secondary | ICD-10-CM | POA: Diagnosis not present

## 2018-06-19 DIAGNOSIS — N186 End stage renal disease: Secondary | ICD-10-CM | POA: Diagnosis not present

## 2018-06-19 DIAGNOSIS — Z992 Dependence on renal dialysis: Secondary | ICD-10-CM | POA: Diagnosis not present

## 2018-06-20 DIAGNOSIS — D631 Anemia in chronic kidney disease: Secondary | ICD-10-CM | POA: Diagnosis not present

## 2018-06-20 DIAGNOSIS — N2581 Secondary hyperparathyroidism of renal origin: Secondary | ICD-10-CM | POA: Diagnosis not present

## 2018-06-20 DIAGNOSIS — N186 End stage renal disease: Secondary | ICD-10-CM | POA: Diagnosis not present

## 2018-06-20 DIAGNOSIS — Z992 Dependence on renal dialysis: Secondary | ICD-10-CM | POA: Diagnosis not present

## 2018-06-20 DIAGNOSIS — D509 Iron deficiency anemia, unspecified: Secondary | ICD-10-CM | POA: Diagnosis not present

## 2018-06-22 DIAGNOSIS — D631 Anemia in chronic kidney disease: Secondary | ICD-10-CM | POA: Diagnosis not present

## 2018-06-22 DIAGNOSIS — N2581 Secondary hyperparathyroidism of renal origin: Secondary | ICD-10-CM | POA: Diagnosis not present

## 2018-06-22 DIAGNOSIS — Z992 Dependence on renal dialysis: Secondary | ICD-10-CM | POA: Diagnosis not present

## 2018-06-22 DIAGNOSIS — D509 Iron deficiency anemia, unspecified: Secondary | ICD-10-CM | POA: Diagnosis not present

## 2018-06-22 DIAGNOSIS — N186 End stage renal disease: Secondary | ICD-10-CM | POA: Diagnosis not present

## 2018-06-24 DIAGNOSIS — Z992 Dependence on renal dialysis: Secondary | ICD-10-CM | POA: Diagnosis not present

## 2018-06-24 DIAGNOSIS — N2581 Secondary hyperparathyroidism of renal origin: Secondary | ICD-10-CM | POA: Diagnosis not present

## 2018-06-24 DIAGNOSIS — D631 Anemia in chronic kidney disease: Secondary | ICD-10-CM | POA: Diagnosis not present

## 2018-06-24 DIAGNOSIS — D509 Iron deficiency anemia, unspecified: Secondary | ICD-10-CM | POA: Diagnosis not present

## 2018-06-24 DIAGNOSIS — N186 End stage renal disease: Secondary | ICD-10-CM | POA: Diagnosis not present

## 2018-06-27 DIAGNOSIS — Z992 Dependence on renal dialysis: Secondary | ICD-10-CM | POA: Diagnosis not present

## 2018-06-27 DIAGNOSIS — D631 Anemia in chronic kidney disease: Secondary | ICD-10-CM | POA: Diagnosis not present

## 2018-06-27 DIAGNOSIS — N2581 Secondary hyperparathyroidism of renal origin: Secondary | ICD-10-CM | POA: Diagnosis not present

## 2018-06-27 DIAGNOSIS — N186 End stage renal disease: Secondary | ICD-10-CM | POA: Diagnosis not present

## 2018-06-27 DIAGNOSIS — D509 Iron deficiency anemia, unspecified: Secondary | ICD-10-CM | POA: Diagnosis not present

## 2018-06-29 DIAGNOSIS — D509 Iron deficiency anemia, unspecified: Secondary | ICD-10-CM | POA: Diagnosis not present

## 2018-06-29 DIAGNOSIS — Z992 Dependence on renal dialysis: Secondary | ICD-10-CM | POA: Diagnosis not present

## 2018-06-29 DIAGNOSIS — N2581 Secondary hyperparathyroidism of renal origin: Secondary | ICD-10-CM | POA: Diagnosis not present

## 2018-06-29 DIAGNOSIS — N186 End stage renal disease: Secondary | ICD-10-CM | POA: Diagnosis not present

## 2018-06-29 DIAGNOSIS — D631 Anemia in chronic kidney disease: Secondary | ICD-10-CM | POA: Diagnosis not present

## 2018-07-01 DIAGNOSIS — D509 Iron deficiency anemia, unspecified: Secondary | ICD-10-CM | POA: Diagnosis not present

## 2018-07-01 DIAGNOSIS — N186 End stage renal disease: Secondary | ICD-10-CM | POA: Diagnosis not present

## 2018-07-01 DIAGNOSIS — D631 Anemia in chronic kidney disease: Secondary | ICD-10-CM | POA: Diagnosis not present

## 2018-07-01 DIAGNOSIS — N2581 Secondary hyperparathyroidism of renal origin: Secondary | ICD-10-CM | POA: Diagnosis not present

## 2018-07-01 DIAGNOSIS — Z992 Dependence on renal dialysis: Secondary | ICD-10-CM | POA: Diagnosis not present

## 2018-07-04 DIAGNOSIS — N186 End stage renal disease: Secondary | ICD-10-CM | POA: Diagnosis not present

## 2018-07-04 DIAGNOSIS — N2581 Secondary hyperparathyroidism of renal origin: Secondary | ICD-10-CM | POA: Diagnosis not present

## 2018-07-04 DIAGNOSIS — Z992 Dependence on renal dialysis: Secondary | ICD-10-CM | POA: Diagnosis not present

## 2018-07-04 DIAGNOSIS — D631 Anemia in chronic kidney disease: Secondary | ICD-10-CM | POA: Diagnosis not present

## 2018-07-04 DIAGNOSIS — D509 Iron deficiency anemia, unspecified: Secondary | ICD-10-CM | POA: Diagnosis not present

## 2018-07-06 DIAGNOSIS — Z992 Dependence on renal dialysis: Secondary | ICD-10-CM | POA: Diagnosis not present

## 2018-07-06 DIAGNOSIS — D509 Iron deficiency anemia, unspecified: Secondary | ICD-10-CM | POA: Diagnosis not present

## 2018-07-06 DIAGNOSIS — D631 Anemia in chronic kidney disease: Secondary | ICD-10-CM | POA: Diagnosis not present

## 2018-07-06 DIAGNOSIS — N2581 Secondary hyperparathyroidism of renal origin: Secondary | ICD-10-CM | POA: Diagnosis not present

## 2018-07-06 DIAGNOSIS — N186 End stage renal disease: Secondary | ICD-10-CM | POA: Diagnosis not present

## 2018-07-08 DIAGNOSIS — Z992 Dependence on renal dialysis: Secondary | ICD-10-CM | POA: Diagnosis not present

## 2018-07-08 DIAGNOSIS — D509 Iron deficiency anemia, unspecified: Secondary | ICD-10-CM | POA: Diagnosis not present

## 2018-07-08 DIAGNOSIS — N2581 Secondary hyperparathyroidism of renal origin: Secondary | ICD-10-CM | POA: Diagnosis not present

## 2018-07-08 DIAGNOSIS — N186 End stage renal disease: Secondary | ICD-10-CM | POA: Diagnosis not present

## 2018-07-08 DIAGNOSIS — D631 Anemia in chronic kidney disease: Secondary | ICD-10-CM | POA: Diagnosis not present

## 2018-07-11 DIAGNOSIS — N186 End stage renal disease: Secondary | ICD-10-CM | POA: Diagnosis not present

## 2018-07-11 DIAGNOSIS — Z992 Dependence on renal dialysis: Secondary | ICD-10-CM | POA: Diagnosis not present

## 2018-07-11 DIAGNOSIS — E109 Type 1 diabetes mellitus without complications: Secondary | ICD-10-CM | POA: Diagnosis not present

## 2018-07-11 DIAGNOSIS — D509 Iron deficiency anemia, unspecified: Secondary | ICD-10-CM | POA: Diagnosis not present

## 2018-07-11 DIAGNOSIS — N2581 Secondary hyperparathyroidism of renal origin: Secondary | ICD-10-CM | POA: Diagnosis not present

## 2018-07-11 DIAGNOSIS — D631 Anemia in chronic kidney disease: Secondary | ICD-10-CM | POA: Diagnosis not present

## 2018-07-11 DIAGNOSIS — Z794 Long term (current) use of insulin: Secondary | ICD-10-CM | POA: Diagnosis not present

## 2018-07-13 DIAGNOSIS — Z992 Dependence on renal dialysis: Secondary | ICD-10-CM | POA: Diagnosis not present

## 2018-07-13 DIAGNOSIS — D631 Anemia in chronic kidney disease: Secondary | ICD-10-CM | POA: Diagnosis not present

## 2018-07-13 DIAGNOSIS — N2581 Secondary hyperparathyroidism of renal origin: Secondary | ICD-10-CM | POA: Diagnosis not present

## 2018-07-13 DIAGNOSIS — D509 Iron deficiency anemia, unspecified: Secondary | ICD-10-CM | POA: Diagnosis not present

## 2018-07-13 DIAGNOSIS — N186 End stage renal disease: Secondary | ICD-10-CM | POA: Diagnosis not present

## 2018-07-15 DIAGNOSIS — D631 Anemia in chronic kidney disease: Secondary | ICD-10-CM | POA: Diagnosis not present

## 2018-07-15 DIAGNOSIS — Z992 Dependence on renal dialysis: Secondary | ICD-10-CM | POA: Diagnosis not present

## 2018-07-15 DIAGNOSIS — N186 End stage renal disease: Secondary | ICD-10-CM | POA: Diagnosis not present

## 2018-07-15 DIAGNOSIS — N2581 Secondary hyperparathyroidism of renal origin: Secondary | ICD-10-CM | POA: Diagnosis not present

## 2018-07-15 DIAGNOSIS — D509 Iron deficiency anemia, unspecified: Secondary | ICD-10-CM | POA: Diagnosis not present

## 2018-07-18 DIAGNOSIS — N186 End stage renal disease: Secondary | ICD-10-CM | POA: Diagnosis not present

## 2018-07-18 DIAGNOSIS — D631 Anemia in chronic kidney disease: Secondary | ICD-10-CM | POA: Diagnosis not present

## 2018-07-18 DIAGNOSIS — N2581 Secondary hyperparathyroidism of renal origin: Secondary | ICD-10-CM | POA: Diagnosis not present

## 2018-07-18 DIAGNOSIS — D509 Iron deficiency anemia, unspecified: Secondary | ICD-10-CM | POA: Diagnosis not present

## 2018-07-18 DIAGNOSIS — Z992 Dependence on renal dialysis: Secondary | ICD-10-CM | POA: Diagnosis not present

## 2018-07-20 DIAGNOSIS — D631 Anemia in chronic kidney disease: Secondary | ICD-10-CM | POA: Diagnosis not present

## 2018-07-20 DIAGNOSIS — N2581 Secondary hyperparathyroidism of renal origin: Secondary | ICD-10-CM | POA: Diagnosis not present

## 2018-07-20 DIAGNOSIS — D509 Iron deficiency anemia, unspecified: Secondary | ICD-10-CM | POA: Diagnosis not present

## 2018-07-20 DIAGNOSIS — Z992 Dependence on renal dialysis: Secondary | ICD-10-CM | POA: Diagnosis not present

## 2018-07-20 DIAGNOSIS — N186 End stage renal disease: Secondary | ICD-10-CM | POA: Diagnosis not present

## 2018-07-22 DIAGNOSIS — D509 Iron deficiency anemia, unspecified: Secondary | ICD-10-CM | POA: Diagnosis not present

## 2018-07-22 DIAGNOSIS — D631 Anemia in chronic kidney disease: Secondary | ICD-10-CM | POA: Diagnosis not present

## 2018-07-22 DIAGNOSIS — N186 End stage renal disease: Secondary | ICD-10-CM | POA: Diagnosis not present

## 2018-07-22 DIAGNOSIS — Z992 Dependence on renal dialysis: Secondary | ICD-10-CM | POA: Diagnosis not present

## 2018-07-22 DIAGNOSIS — N2581 Secondary hyperparathyroidism of renal origin: Secondary | ICD-10-CM | POA: Diagnosis not present

## 2018-07-25 DIAGNOSIS — N186 End stage renal disease: Secondary | ICD-10-CM | POA: Diagnosis not present

## 2018-07-25 DIAGNOSIS — D631 Anemia in chronic kidney disease: Secondary | ICD-10-CM | POA: Diagnosis not present

## 2018-07-25 DIAGNOSIS — Z992 Dependence on renal dialysis: Secondary | ICD-10-CM | POA: Diagnosis not present

## 2018-07-25 DIAGNOSIS — D509 Iron deficiency anemia, unspecified: Secondary | ICD-10-CM | POA: Diagnosis not present

## 2018-07-25 DIAGNOSIS — N2581 Secondary hyperparathyroidism of renal origin: Secondary | ICD-10-CM | POA: Diagnosis not present

## 2018-07-27 DIAGNOSIS — N2581 Secondary hyperparathyroidism of renal origin: Secondary | ICD-10-CM | POA: Diagnosis not present

## 2018-07-27 DIAGNOSIS — D631 Anemia in chronic kidney disease: Secondary | ICD-10-CM | POA: Diagnosis not present

## 2018-07-27 DIAGNOSIS — N186 End stage renal disease: Secondary | ICD-10-CM | POA: Diagnosis not present

## 2018-07-27 DIAGNOSIS — Z992 Dependence on renal dialysis: Secondary | ICD-10-CM | POA: Diagnosis not present

## 2018-07-27 DIAGNOSIS — D509 Iron deficiency anemia, unspecified: Secondary | ICD-10-CM | POA: Diagnosis not present

## 2018-07-28 DIAGNOSIS — I120 Hypertensive chronic kidney disease with stage 5 chronic kidney disease or end stage renal disease: Secondary | ICD-10-CM | POA: Diagnosis not present

## 2018-07-28 DIAGNOSIS — Z87891 Personal history of nicotine dependence: Secondary | ICD-10-CM | POA: Diagnosis not present

## 2018-07-28 DIAGNOSIS — I2510 Atherosclerotic heart disease of native coronary artery without angina pectoris: Secondary | ICD-10-CM | POA: Diagnosis not present

## 2018-07-28 DIAGNOSIS — N186 End stage renal disease: Secondary | ICD-10-CM | POA: Diagnosis not present

## 2018-07-28 DIAGNOSIS — I255 Ischemic cardiomyopathy: Secondary | ICD-10-CM | POA: Diagnosis not present

## 2018-07-29 DIAGNOSIS — D631 Anemia in chronic kidney disease: Secondary | ICD-10-CM | POA: Diagnosis not present

## 2018-07-29 DIAGNOSIS — Z992 Dependence on renal dialysis: Secondary | ICD-10-CM | POA: Diagnosis not present

## 2018-07-29 DIAGNOSIS — D509 Iron deficiency anemia, unspecified: Secondary | ICD-10-CM | POA: Diagnosis not present

## 2018-07-29 DIAGNOSIS — N186 End stage renal disease: Secondary | ICD-10-CM | POA: Diagnosis not present

## 2018-07-29 DIAGNOSIS — N2581 Secondary hyperparathyroidism of renal origin: Secondary | ICD-10-CM | POA: Diagnosis not present

## 2018-08-01 DIAGNOSIS — N2581 Secondary hyperparathyroidism of renal origin: Secondary | ICD-10-CM | POA: Diagnosis not present

## 2018-08-01 DIAGNOSIS — D631 Anemia in chronic kidney disease: Secondary | ICD-10-CM | POA: Diagnosis not present

## 2018-08-01 DIAGNOSIS — N186 End stage renal disease: Secondary | ICD-10-CM | POA: Diagnosis not present

## 2018-08-01 DIAGNOSIS — D509 Iron deficiency anemia, unspecified: Secondary | ICD-10-CM | POA: Diagnosis not present

## 2018-08-01 DIAGNOSIS — Z992 Dependence on renal dialysis: Secondary | ICD-10-CM | POA: Diagnosis not present

## 2018-08-03 DIAGNOSIS — N2581 Secondary hyperparathyroidism of renal origin: Secondary | ICD-10-CM | POA: Diagnosis not present

## 2018-08-03 DIAGNOSIS — N186 End stage renal disease: Secondary | ICD-10-CM | POA: Diagnosis not present

## 2018-08-03 DIAGNOSIS — D631 Anemia in chronic kidney disease: Secondary | ICD-10-CM | POA: Diagnosis not present

## 2018-08-03 DIAGNOSIS — D509 Iron deficiency anemia, unspecified: Secondary | ICD-10-CM | POA: Diagnosis not present

## 2018-08-03 DIAGNOSIS — Z992 Dependence on renal dialysis: Secondary | ICD-10-CM | POA: Diagnosis not present

## 2018-08-05 DIAGNOSIS — D509 Iron deficiency anemia, unspecified: Secondary | ICD-10-CM | POA: Diagnosis not present

## 2018-08-05 DIAGNOSIS — N186 End stage renal disease: Secondary | ICD-10-CM | POA: Diagnosis not present

## 2018-08-05 DIAGNOSIS — Z992 Dependence on renal dialysis: Secondary | ICD-10-CM | POA: Diagnosis not present

## 2018-08-05 DIAGNOSIS — N2581 Secondary hyperparathyroidism of renal origin: Secondary | ICD-10-CM | POA: Diagnosis not present

## 2018-08-05 DIAGNOSIS — D631 Anemia in chronic kidney disease: Secondary | ICD-10-CM | POA: Diagnosis not present

## 2018-08-08 DIAGNOSIS — Z992 Dependence on renal dialysis: Secondary | ICD-10-CM | POA: Diagnosis not present

## 2018-08-08 DIAGNOSIS — N2581 Secondary hyperparathyroidism of renal origin: Secondary | ICD-10-CM | POA: Diagnosis not present

## 2018-08-08 DIAGNOSIS — D509 Iron deficiency anemia, unspecified: Secondary | ICD-10-CM | POA: Diagnosis not present

## 2018-08-08 DIAGNOSIS — N186 End stage renal disease: Secondary | ICD-10-CM | POA: Diagnosis not present

## 2018-08-08 DIAGNOSIS — D631 Anemia in chronic kidney disease: Secondary | ICD-10-CM | POA: Diagnosis not present

## 2018-08-10 DIAGNOSIS — N186 End stage renal disease: Secondary | ICD-10-CM | POA: Diagnosis not present

## 2018-08-10 DIAGNOSIS — D631 Anemia in chronic kidney disease: Secondary | ICD-10-CM | POA: Diagnosis not present

## 2018-08-10 DIAGNOSIS — D509 Iron deficiency anemia, unspecified: Secondary | ICD-10-CM | POA: Diagnosis not present

## 2018-08-10 DIAGNOSIS — Z992 Dependence on renal dialysis: Secondary | ICD-10-CM | POA: Diagnosis not present

## 2018-08-10 DIAGNOSIS — N2581 Secondary hyperparathyroidism of renal origin: Secondary | ICD-10-CM | POA: Diagnosis not present

## 2018-08-12 DIAGNOSIS — D509 Iron deficiency anemia, unspecified: Secondary | ICD-10-CM | POA: Diagnosis not present

## 2018-08-12 DIAGNOSIS — Z992 Dependence on renal dialysis: Secondary | ICD-10-CM | POA: Diagnosis not present

## 2018-08-12 DIAGNOSIS — N2581 Secondary hyperparathyroidism of renal origin: Secondary | ICD-10-CM | POA: Diagnosis not present

## 2018-08-12 DIAGNOSIS — D631 Anemia in chronic kidney disease: Secondary | ICD-10-CM | POA: Diagnosis not present

## 2018-08-12 DIAGNOSIS — N186 End stage renal disease: Secondary | ICD-10-CM | POA: Diagnosis not present

## 2018-08-15 DIAGNOSIS — N2581 Secondary hyperparathyroidism of renal origin: Secondary | ICD-10-CM | POA: Diagnosis not present

## 2018-08-15 DIAGNOSIS — N186 End stage renal disease: Secondary | ICD-10-CM | POA: Diagnosis not present

## 2018-08-15 DIAGNOSIS — Z992 Dependence on renal dialysis: Secondary | ICD-10-CM | POA: Diagnosis not present

## 2018-08-15 DIAGNOSIS — D509 Iron deficiency anemia, unspecified: Secondary | ICD-10-CM | POA: Diagnosis not present

## 2018-08-15 DIAGNOSIS — D631 Anemia in chronic kidney disease: Secondary | ICD-10-CM | POA: Diagnosis not present

## 2018-08-17 DIAGNOSIS — N2581 Secondary hyperparathyroidism of renal origin: Secondary | ICD-10-CM | POA: Diagnosis not present

## 2018-08-17 DIAGNOSIS — Z992 Dependence on renal dialysis: Secondary | ICD-10-CM | POA: Diagnosis not present

## 2018-08-17 DIAGNOSIS — N186 End stage renal disease: Secondary | ICD-10-CM | POA: Diagnosis not present

## 2018-08-17 DIAGNOSIS — D509 Iron deficiency anemia, unspecified: Secondary | ICD-10-CM | POA: Diagnosis not present

## 2018-08-17 DIAGNOSIS — D631 Anemia in chronic kidney disease: Secondary | ICD-10-CM | POA: Diagnosis not present

## 2018-08-19 DIAGNOSIS — Z992 Dependence on renal dialysis: Secondary | ICD-10-CM | POA: Diagnosis not present

## 2018-08-19 DIAGNOSIS — D631 Anemia in chronic kidney disease: Secondary | ICD-10-CM | POA: Diagnosis not present

## 2018-08-19 DIAGNOSIS — D509 Iron deficiency anemia, unspecified: Secondary | ICD-10-CM | POA: Diagnosis not present

## 2018-08-19 DIAGNOSIS — N186 End stage renal disease: Secondary | ICD-10-CM | POA: Diagnosis not present

## 2018-08-19 DIAGNOSIS — N2581 Secondary hyperparathyroidism of renal origin: Secondary | ICD-10-CM | POA: Diagnosis not present

## 2018-08-20 DIAGNOSIS — Z992 Dependence on renal dialysis: Secondary | ICD-10-CM | POA: Diagnosis not present

## 2018-08-20 DIAGNOSIS — N186 End stage renal disease: Secondary | ICD-10-CM | POA: Diagnosis not present

## 2018-08-22 DIAGNOSIS — D509 Iron deficiency anemia, unspecified: Secondary | ICD-10-CM | POA: Diagnosis not present

## 2018-08-22 DIAGNOSIS — N2581 Secondary hyperparathyroidism of renal origin: Secondary | ICD-10-CM | POA: Diagnosis not present

## 2018-08-22 DIAGNOSIS — D631 Anemia in chronic kidney disease: Secondary | ICD-10-CM | POA: Diagnosis not present

## 2018-08-22 DIAGNOSIS — Z992 Dependence on renal dialysis: Secondary | ICD-10-CM | POA: Diagnosis not present

## 2018-08-22 DIAGNOSIS — N186 End stage renal disease: Secondary | ICD-10-CM | POA: Diagnosis not present

## 2018-08-24 DIAGNOSIS — N186 End stage renal disease: Secondary | ICD-10-CM | POA: Diagnosis not present

## 2018-08-24 DIAGNOSIS — D631 Anemia in chronic kidney disease: Secondary | ICD-10-CM | POA: Diagnosis not present

## 2018-08-24 DIAGNOSIS — D509 Iron deficiency anemia, unspecified: Secondary | ICD-10-CM | POA: Diagnosis not present

## 2018-08-24 DIAGNOSIS — Z992 Dependence on renal dialysis: Secondary | ICD-10-CM | POA: Diagnosis not present

## 2018-08-24 DIAGNOSIS — N2581 Secondary hyperparathyroidism of renal origin: Secondary | ICD-10-CM | POA: Diagnosis not present

## 2018-08-26 DIAGNOSIS — N186 End stage renal disease: Secondary | ICD-10-CM | POA: Diagnosis not present

## 2018-08-26 DIAGNOSIS — N2581 Secondary hyperparathyroidism of renal origin: Secondary | ICD-10-CM | POA: Diagnosis not present

## 2018-08-26 DIAGNOSIS — D631 Anemia in chronic kidney disease: Secondary | ICD-10-CM | POA: Diagnosis not present

## 2018-08-26 DIAGNOSIS — D509 Iron deficiency anemia, unspecified: Secondary | ICD-10-CM | POA: Diagnosis not present

## 2018-08-26 DIAGNOSIS — Z992 Dependence on renal dialysis: Secondary | ICD-10-CM | POA: Diagnosis not present

## 2018-08-29 DIAGNOSIS — N186 End stage renal disease: Secondary | ICD-10-CM | POA: Diagnosis not present

## 2018-08-29 DIAGNOSIS — D509 Iron deficiency anemia, unspecified: Secondary | ICD-10-CM | POA: Diagnosis not present

## 2018-08-29 DIAGNOSIS — D631 Anemia in chronic kidney disease: Secondary | ICD-10-CM | POA: Diagnosis not present

## 2018-08-29 DIAGNOSIS — N2581 Secondary hyperparathyroidism of renal origin: Secondary | ICD-10-CM | POA: Diagnosis not present

## 2018-08-29 DIAGNOSIS — Z992 Dependence on renal dialysis: Secondary | ICD-10-CM | POA: Diagnosis not present

## 2018-08-31 DIAGNOSIS — N2581 Secondary hyperparathyroidism of renal origin: Secondary | ICD-10-CM | POA: Diagnosis not present

## 2018-08-31 DIAGNOSIS — N186 End stage renal disease: Secondary | ICD-10-CM | POA: Diagnosis not present

## 2018-08-31 DIAGNOSIS — Z992 Dependence on renal dialysis: Secondary | ICD-10-CM | POA: Diagnosis not present

## 2018-08-31 DIAGNOSIS — D631 Anemia in chronic kidney disease: Secondary | ICD-10-CM | POA: Diagnosis not present

## 2018-08-31 DIAGNOSIS — D509 Iron deficiency anemia, unspecified: Secondary | ICD-10-CM | POA: Diagnosis not present

## 2018-09-02 DIAGNOSIS — D631 Anemia in chronic kidney disease: Secondary | ICD-10-CM | POA: Diagnosis not present

## 2018-09-02 DIAGNOSIS — N2581 Secondary hyperparathyroidism of renal origin: Secondary | ICD-10-CM | POA: Diagnosis not present

## 2018-09-02 DIAGNOSIS — D509 Iron deficiency anemia, unspecified: Secondary | ICD-10-CM | POA: Diagnosis not present

## 2018-09-02 DIAGNOSIS — N186 End stage renal disease: Secondary | ICD-10-CM | POA: Diagnosis not present

## 2018-09-02 DIAGNOSIS — Z992 Dependence on renal dialysis: Secondary | ICD-10-CM | POA: Diagnosis not present

## 2018-09-05 DIAGNOSIS — D509 Iron deficiency anemia, unspecified: Secondary | ICD-10-CM | POA: Diagnosis not present

## 2018-09-05 DIAGNOSIS — N2581 Secondary hyperparathyroidism of renal origin: Secondary | ICD-10-CM | POA: Diagnosis not present

## 2018-09-05 DIAGNOSIS — D631 Anemia in chronic kidney disease: Secondary | ICD-10-CM | POA: Diagnosis not present

## 2018-09-05 DIAGNOSIS — Z992 Dependence on renal dialysis: Secondary | ICD-10-CM | POA: Diagnosis not present

## 2018-09-05 DIAGNOSIS — N186 End stage renal disease: Secondary | ICD-10-CM | POA: Diagnosis not present

## 2018-09-07 DIAGNOSIS — N186 End stage renal disease: Secondary | ICD-10-CM | POA: Diagnosis not present

## 2018-09-07 DIAGNOSIS — D631 Anemia in chronic kidney disease: Secondary | ICD-10-CM | POA: Diagnosis not present

## 2018-09-07 DIAGNOSIS — N2581 Secondary hyperparathyroidism of renal origin: Secondary | ICD-10-CM | POA: Diagnosis not present

## 2018-09-07 DIAGNOSIS — D509 Iron deficiency anemia, unspecified: Secondary | ICD-10-CM | POA: Diagnosis not present

## 2018-09-07 DIAGNOSIS — Z992 Dependence on renal dialysis: Secondary | ICD-10-CM | POA: Diagnosis not present

## 2018-09-09 DIAGNOSIS — D631 Anemia in chronic kidney disease: Secondary | ICD-10-CM | POA: Diagnosis not present

## 2018-09-09 DIAGNOSIS — N186 End stage renal disease: Secondary | ICD-10-CM | POA: Diagnosis not present

## 2018-09-09 DIAGNOSIS — D509 Iron deficiency anemia, unspecified: Secondary | ICD-10-CM | POA: Diagnosis not present

## 2018-09-09 DIAGNOSIS — Z992 Dependence on renal dialysis: Secondary | ICD-10-CM | POA: Diagnosis not present

## 2018-09-09 DIAGNOSIS — N2581 Secondary hyperparathyroidism of renal origin: Secondary | ICD-10-CM | POA: Diagnosis not present

## 2018-09-12 DIAGNOSIS — D509 Iron deficiency anemia, unspecified: Secondary | ICD-10-CM | POA: Diagnosis not present

## 2018-09-12 DIAGNOSIS — D631 Anemia in chronic kidney disease: Secondary | ICD-10-CM | POA: Diagnosis not present

## 2018-09-12 DIAGNOSIS — Z992 Dependence on renal dialysis: Secondary | ICD-10-CM | POA: Diagnosis not present

## 2018-09-12 DIAGNOSIS — N186 End stage renal disease: Secondary | ICD-10-CM | POA: Diagnosis not present

## 2018-09-12 DIAGNOSIS — N2581 Secondary hyperparathyroidism of renal origin: Secondary | ICD-10-CM | POA: Diagnosis not present

## 2018-09-14 DIAGNOSIS — Z992 Dependence on renal dialysis: Secondary | ICD-10-CM | POA: Diagnosis not present

## 2018-09-14 DIAGNOSIS — N186 End stage renal disease: Secondary | ICD-10-CM | POA: Diagnosis not present

## 2018-09-14 DIAGNOSIS — N2581 Secondary hyperparathyroidism of renal origin: Secondary | ICD-10-CM | POA: Diagnosis not present

## 2018-09-14 DIAGNOSIS — D509 Iron deficiency anemia, unspecified: Secondary | ICD-10-CM | POA: Diagnosis not present

## 2018-09-14 DIAGNOSIS — D631 Anemia in chronic kidney disease: Secondary | ICD-10-CM | POA: Diagnosis not present

## 2018-09-16 DIAGNOSIS — D631 Anemia in chronic kidney disease: Secondary | ICD-10-CM | POA: Diagnosis not present

## 2018-09-16 DIAGNOSIS — Z992 Dependence on renal dialysis: Secondary | ICD-10-CM | POA: Diagnosis not present

## 2018-09-16 DIAGNOSIS — N2581 Secondary hyperparathyroidism of renal origin: Secondary | ICD-10-CM | POA: Diagnosis not present

## 2018-09-16 DIAGNOSIS — D509 Iron deficiency anemia, unspecified: Secondary | ICD-10-CM | POA: Diagnosis not present

## 2018-09-16 DIAGNOSIS — N186 End stage renal disease: Secondary | ICD-10-CM | POA: Diagnosis not present

## 2018-09-19 DIAGNOSIS — D631 Anemia in chronic kidney disease: Secondary | ICD-10-CM | POA: Diagnosis not present

## 2018-09-19 DIAGNOSIS — N186 End stage renal disease: Secondary | ICD-10-CM | POA: Diagnosis not present

## 2018-09-19 DIAGNOSIS — D509 Iron deficiency anemia, unspecified: Secondary | ICD-10-CM | POA: Diagnosis not present

## 2018-09-19 DIAGNOSIS — N2581 Secondary hyperparathyroidism of renal origin: Secondary | ICD-10-CM | POA: Diagnosis not present

## 2018-09-19 DIAGNOSIS — Z992 Dependence on renal dialysis: Secondary | ICD-10-CM | POA: Diagnosis not present

## 2018-09-21 DIAGNOSIS — N2581 Secondary hyperparathyroidism of renal origin: Secondary | ICD-10-CM | POA: Diagnosis not present

## 2018-09-21 DIAGNOSIS — D631 Anemia in chronic kidney disease: Secondary | ICD-10-CM | POA: Diagnosis not present

## 2018-09-21 DIAGNOSIS — D509 Iron deficiency anemia, unspecified: Secondary | ICD-10-CM | POA: Diagnosis not present

## 2018-09-21 DIAGNOSIS — Z23 Encounter for immunization: Secondary | ICD-10-CM | POA: Diagnosis not present

## 2018-09-21 DIAGNOSIS — Z992 Dependence on renal dialysis: Secondary | ICD-10-CM | POA: Diagnosis not present

## 2018-09-21 DIAGNOSIS — N186 End stage renal disease: Secondary | ICD-10-CM | POA: Diagnosis not present

## 2018-09-23 DIAGNOSIS — D509 Iron deficiency anemia, unspecified: Secondary | ICD-10-CM | POA: Diagnosis not present

## 2018-09-23 DIAGNOSIS — N2581 Secondary hyperparathyroidism of renal origin: Secondary | ICD-10-CM | POA: Diagnosis not present

## 2018-09-23 DIAGNOSIS — D631 Anemia in chronic kidney disease: Secondary | ICD-10-CM | POA: Diagnosis not present

## 2018-09-23 DIAGNOSIS — Z992 Dependence on renal dialysis: Secondary | ICD-10-CM | POA: Diagnosis not present

## 2018-09-23 DIAGNOSIS — Z23 Encounter for immunization: Secondary | ICD-10-CM | POA: Diagnosis not present

## 2018-09-23 DIAGNOSIS — N186 End stage renal disease: Secondary | ICD-10-CM | POA: Diagnosis not present

## 2018-09-26 DIAGNOSIS — N2581 Secondary hyperparathyroidism of renal origin: Secondary | ICD-10-CM | POA: Diagnosis not present

## 2018-09-26 DIAGNOSIS — D631 Anemia in chronic kidney disease: Secondary | ICD-10-CM | POA: Diagnosis not present

## 2018-09-26 DIAGNOSIS — Z23 Encounter for immunization: Secondary | ICD-10-CM | POA: Diagnosis not present

## 2018-09-26 DIAGNOSIS — N186 End stage renal disease: Secondary | ICD-10-CM | POA: Diagnosis not present

## 2018-09-26 DIAGNOSIS — Z992 Dependence on renal dialysis: Secondary | ICD-10-CM | POA: Diagnosis not present

## 2018-09-26 DIAGNOSIS — D509 Iron deficiency anemia, unspecified: Secondary | ICD-10-CM | POA: Diagnosis not present

## 2018-09-28 DIAGNOSIS — N186 End stage renal disease: Secondary | ICD-10-CM | POA: Diagnosis not present

## 2018-09-28 DIAGNOSIS — D509 Iron deficiency anemia, unspecified: Secondary | ICD-10-CM | POA: Diagnosis not present

## 2018-09-28 DIAGNOSIS — D631 Anemia in chronic kidney disease: Secondary | ICD-10-CM | POA: Diagnosis not present

## 2018-09-28 DIAGNOSIS — Z23 Encounter for immunization: Secondary | ICD-10-CM | POA: Diagnosis not present

## 2018-09-28 DIAGNOSIS — N2581 Secondary hyperparathyroidism of renal origin: Secondary | ICD-10-CM | POA: Diagnosis not present

## 2018-09-28 DIAGNOSIS — Z992 Dependence on renal dialysis: Secondary | ICD-10-CM | POA: Diagnosis not present

## 2018-09-30 DIAGNOSIS — Z992 Dependence on renal dialysis: Secondary | ICD-10-CM | POA: Diagnosis not present

## 2018-09-30 DIAGNOSIS — N2581 Secondary hyperparathyroidism of renal origin: Secondary | ICD-10-CM | POA: Diagnosis not present

## 2018-09-30 DIAGNOSIS — Z23 Encounter for immunization: Secondary | ICD-10-CM | POA: Diagnosis not present

## 2018-09-30 DIAGNOSIS — N186 End stage renal disease: Secondary | ICD-10-CM | POA: Diagnosis not present

## 2018-09-30 DIAGNOSIS — D509 Iron deficiency anemia, unspecified: Secondary | ICD-10-CM | POA: Diagnosis not present

## 2018-09-30 DIAGNOSIS — D631 Anemia in chronic kidney disease: Secondary | ICD-10-CM | POA: Diagnosis not present

## 2018-10-03 DIAGNOSIS — Z23 Encounter for immunization: Secondary | ICD-10-CM | POA: Diagnosis not present

## 2018-10-03 DIAGNOSIS — Z992 Dependence on renal dialysis: Secondary | ICD-10-CM | POA: Diagnosis not present

## 2018-10-03 DIAGNOSIS — N2581 Secondary hyperparathyroidism of renal origin: Secondary | ICD-10-CM | POA: Diagnosis not present

## 2018-10-03 DIAGNOSIS — N186 End stage renal disease: Secondary | ICD-10-CM | POA: Diagnosis not present

## 2018-10-03 DIAGNOSIS — D509 Iron deficiency anemia, unspecified: Secondary | ICD-10-CM | POA: Diagnosis not present

## 2018-10-03 DIAGNOSIS — D631 Anemia in chronic kidney disease: Secondary | ICD-10-CM | POA: Diagnosis not present

## 2018-10-05 DIAGNOSIS — N2581 Secondary hyperparathyroidism of renal origin: Secondary | ICD-10-CM | POA: Diagnosis not present

## 2018-10-05 DIAGNOSIS — Z992 Dependence on renal dialysis: Secondary | ICD-10-CM | POA: Diagnosis not present

## 2018-10-05 DIAGNOSIS — Z23 Encounter for immunization: Secondary | ICD-10-CM | POA: Diagnosis not present

## 2018-10-05 DIAGNOSIS — D631 Anemia in chronic kidney disease: Secondary | ICD-10-CM | POA: Diagnosis not present

## 2018-10-05 DIAGNOSIS — D509 Iron deficiency anemia, unspecified: Secondary | ICD-10-CM | POA: Diagnosis not present

## 2018-10-05 DIAGNOSIS — N186 End stage renal disease: Secondary | ICD-10-CM | POA: Diagnosis not present

## 2018-10-07 DIAGNOSIS — Z23 Encounter for immunization: Secondary | ICD-10-CM | POA: Diagnosis not present

## 2018-10-07 DIAGNOSIS — D631 Anemia in chronic kidney disease: Secondary | ICD-10-CM | POA: Diagnosis not present

## 2018-10-07 DIAGNOSIS — Z992 Dependence on renal dialysis: Secondary | ICD-10-CM | POA: Diagnosis not present

## 2018-10-07 DIAGNOSIS — N2581 Secondary hyperparathyroidism of renal origin: Secondary | ICD-10-CM | POA: Diagnosis not present

## 2018-10-07 DIAGNOSIS — N186 End stage renal disease: Secondary | ICD-10-CM | POA: Diagnosis not present

## 2018-10-07 DIAGNOSIS — D509 Iron deficiency anemia, unspecified: Secondary | ICD-10-CM | POA: Diagnosis not present

## 2018-10-10 DIAGNOSIS — Z23 Encounter for immunization: Secondary | ICD-10-CM | POA: Diagnosis not present

## 2018-10-10 DIAGNOSIS — N186 End stage renal disease: Secondary | ICD-10-CM | POA: Diagnosis not present

## 2018-10-10 DIAGNOSIS — N2581 Secondary hyperparathyroidism of renal origin: Secondary | ICD-10-CM | POA: Diagnosis not present

## 2018-10-10 DIAGNOSIS — D509 Iron deficiency anemia, unspecified: Secondary | ICD-10-CM | POA: Diagnosis not present

## 2018-10-10 DIAGNOSIS — D631 Anemia in chronic kidney disease: Secondary | ICD-10-CM | POA: Diagnosis not present

## 2018-10-10 DIAGNOSIS — Z992 Dependence on renal dialysis: Secondary | ICD-10-CM | POA: Diagnosis not present

## 2018-10-12 DIAGNOSIS — Z23 Encounter for immunization: Secondary | ICD-10-CM | POA: Diagnosis not present

## 2018-10-12 DIAGNOSIS — N186 End stage renal disease: Secondary | ICD-10-CM | POA: Diagnosis not present

## 2018-10-12 DIAGNOSIS — Z992 Dependence on renal dialysis: Secondary | ICD-10-CM | POA: Diagnosis not present

## 2018-10-12 DIAGNOSIS — D631 Anemia in chronic kidney disease: Secondary | ICD-10-CM | POA: Diagnosis not present

## 2018-10-12 DIAGNOSIS — N2581 Secondary hyperparathyroidism of renal origin: Secondary | ICD-10-CM | POA: Diagnosis not present

## 2018-10-12 DIAGNOSIS — D509 Iron deficiency anemia, unspecified: Secondary | ICD-10-CM | POA: Diagnosis not present

## 2018-10-14 DIAGNOSIS — Z23 Encounter for immunization: Secondary | ICD-10-CM | POA: Diagnosis not present

## 2018-10-14 DIAGNOSIS — Z992 Dependence on renal dialysis: Secondary | ICD-10-CM | POA: Diagnosis not present

## 2018-10-14 DIAGNOSIS — D631 Anemia in chronic kidney disease: Secondary | ICD-10-CM | POA: Diagnosis not present

## 2018-10-14 DIAGNOSIS — N2581 Secondary hyperparathyroidism of renal origin: Secondary | ICD-10-CM | POA: Diagnosis not present

## 2018-10-14 DIAGNOSIS — N186 End stage renal disease: Secondary | ICD-10-CM | POA: Diagnosis not present

## 2018-10-14 DIAGNOSIS — D509 Iron deficiency anemia, unspecified: Secondary | ICD-10-CM | POA: Diagnosis not present

## 2018-10-17 DIAGNOSIS — N186 End stage renal disease: Secondary | ICD-10-CM | POA: Diagnosis not present

## 2018-10-17 DIAGNOSIS — D631 Anemia in chronic kidney disease: Secondary | ICD-10-CM | POA: Diagnosis not present

## 2018-10-17 DIAGNOSIS — N2581 Secondary hyperparathyroidism of renal origin: Secondary | ICD-10-CM | POA: Diagnosis not present

## 2018-10-17 DIAGNOSIS — D509 Iron deficiency anemia, unspecified: Secondary | ICD-10-CM | POA: Diagnosis not present

## 2018-10-17 DIAGNOSIS — Z992 Dependence on renal dialysis: Secondary | ICD-10-CM | POA: Diagnosis not present

## 2018-10-17 DIAGNOSIS — Z23 Encounter for immunization: Secondary | ICD-10-CM | POA: Diagnosis not present

## 2018-10-19 DIAGNOSIS — D631 Anemia in chronic kidney disease: Secondary | ICD-10-CM | POA: Diagnosis not present

## 2018-10-19 DIAGNOSIS — N2581 Secondary hyperparathyroidism of renal origin: Secondary | ICD-10-CM | POA: Diagnosis not present

## 2018-10-19 DIAGNOSIS — D509 Iron deficiency anemia, unspecified: Secondary | ICD-10-CM | POA: Diagnosis not present

## 2018-10-19 DIAGNOSIS — Z23 Encounter for immunization: Secondary | ICD-10-CM | POA: Diagnosis not present

## 2018-10-19 DIAGNOSIS — Z992 Dependence on renal dialysis: Secondary | ICD-10-CM | POA: Diagnosis not present

## 2018-10-19 DIAGNOSIS — N186 End stage renal disease: Secondary | ICD-10-CM | POA: Diagnosis not present

## 2018-10-20 DIAGNOSIS — Z992 Dependence on renal dialysis: Secondary | ICD-10-CM | POA: Diagnosis not present

## 2018-10-20 DIAGNOSIS — N186 End stage renal disease: Secondary | ICD-10-CM | POA: Diagnosis not present

## 2018-10-21 DIAGNOSIS — D509 Iron deficiency anemia, unspecified: Secondary | ICD-10-CM | POA: Diagnosis not present

## 2018-10-21 DIAGNOSIS — D631 Anemia in chronic kidney disease: Secondary | ICD-10-CM | POA: Diagnosis not present

## 2018-10-21 DIAGNOSIS — Z992 Dependence on renal dialysis: Secondary | ICD-10-CM | POA: Diagnosis not present

## 2018-10-21 DIAGNOSIS — N2581 Secondary hyperparathyroidism of renal origin: Secondary | ICD-10-CM | POA: Diagnosis not present

## 2018-10-21 DIAGNOSIS — N186 End stage renal disease: Secondary | ICD-10-CM | POA: Diagnosis not present

## 2018-10-24 DIAGNOSIS — N186 End stage renal disease: Secondary | ICD-10-CM | POA: Diagnosis not present

## 2018-10-24 DIAGNOSIS — D631 Anemia in chronic kidney disease: Secondary | ICD-10-CM | POA: Diagnosis not present

## 2018-10-24 DIAGNOSIS — N2581 Secondary hyperparathyroidism of renal origin: Secondary | ICD-10-CM | POA: Diagnosis not present

## 2018-10-24 DIAGNOSIS — D509 Iron deficiency anemia, unspecified: Secondary | ICD-10-CM | POA: Diagnosis not present

## 2018-10-24 DIAGNOSIS — Z992 Dependence on renal dialysis: Secondary | ICD-10-CM | POA: Diagnosis not present

## 2018-10-26 DIAGNOSIS — D631 Anemia in chronic kidney disease: Secondary | ICD-10-CM | POA: Diagnosis not present

## 2018-10-26 DIAGNOSIS — Z992 Dependence on renal dialysis: Secondary | ICD-10-CM | POA: Diagnosis not present

## 2018-10-26 DIAGNOSIS — N186 End stage renal disease: Secondary | ICD-10-CM | POA: Diagnosis not present

## 2018-10-26 DIAGNOSIS — D509 Iron deficiency anemia, unspecified: Secondary | ICD-10-CM | POA: Diagnosis not present

## 2018-10-26 DIAGNOSIS — N2581 Secondary hyperparathyroidism of renal origin: Secondary | ICD-10-CM | POA: Diagnosis not present

## 2018-10-28 DIAGNOSIS — D509 Iron deficiency anemia, unspecified: Secondary | ICD-10-CM | POA: Diagnosis not present

## 2018-10-28 DIAGNOSIS — N186 End stage renal disease: Secondary | ICD-10-CM | POA: Diagnosis not present

## 2018-10-28 DIAGNOSIS — D631 Anemia in chronic kidney disease: Secondary | ICD-10-CM | POA: Diagnosis not present

## 2018-10-28 DIAGNOSIS — N2581 Secondary hyperparathyroidism of renal origin: Secondary | ICD-10-CM | POA: Diagnosis not present

## 2018-10-28 DIAGNOSIS — Z992 Dependence on renal dialysis: Secondary | ICD-10-CM | POA: Diagnosis not present

## 2018-10-31 DIAGNOSIS — D631 Anemia in chronic kidney disease: Secondary | ICD-10-CM | POA: Diagnosis not present

## 2018-10-31 DIAGNOSIS — D509 Iron deficiency anemia, unspecified: Secondary | ICD-10-CM | POA: Diagnosis not present

## 2018-10-31 DIAGNOSIS — Z992 Dependence on renal dialysis: Secondary | ICD-10-CM | POA: Diagnosis not present

## 2018-10-31 DIAGNOSIS — N2581 Secondary hyperparathyroidism of renal origin: Secondary | ICD-10-CM | POA: Diagnosis not present

## 2018-10-31 DIAGNOSIS — N186 End stage renal disease: Secondary | ICD-10-CM | POA: Diagnosis not present

## 2018-11-02 DIAGNOSIS — N186 End stage renal disease: Secondary | ICD-10-CM | POA: Diagnosis not present

## 2018-11-02 DIAGNOSIS — Z992 Dependence on renal dialysis: Secondary | ICD-10-CM | POA: Diagnosis not present

## 2018-11-02 DIAGNOSIS — D631 Anemia in chronic kidney disease: Secondary | ICD-10-CM | POA: Diagnosis not present

## 2018-11-02 DIAGNOSIS — N2581 Secondary hyperparathyroidism of renal origin: Secondary | ICD-10-CM | POA: Diagnosis not present

## 2018-11-02 DIAGNOSIS — D509 Iron deficiency anemia, unspecified: Secondary | ICD-10-CM | POA: Diagnosis not present

## 2018-11-04 DIAGNOSIS — D509 Iron deficiency anemia, unspecified: Secondary | ICD-10-CM | POA: Diagnosis not present

## 2018-11-04 DIAGNOSIS — Z992 Dependence on renal dialysis: Secondary | ICD-10-CM | POA: Diagnosis not present

## 2018-11-04 DIAGNOSIS — D631 Anemia in chronic kidney disease: Secondary | ICD-10-CM | POA: Diagnosis not present

## 2018-11-04 DIAGNOSIS — N2581 Secondary hyperparathyroidism of renal origin: Secondary | ICD-10-CM | POA: Diagnosis not present

## 2018-11-04 DIAGNOSIS — N186 End stage renal disease: Secondary | ICD-10-CM | POA: Diagnosis not present

## 2018-11-07 DIAGNOSIS — D509 Iron deficiency anemia, unspecified: Secondary | ICD-10-CM | POA: Diagnosis not present

## 2018-11-07 DIAGNOSIS — D631 Anemia in chronic kidney disease: Secondary | ICD-10-CM | POA: Diagnosis not present

## 2018-11-07 DIAGNOSIS — N186 End stage renal disease: Secondary | ICD-10-CM | POA: Diagnosis not present

## 2018-11-07 DIAGNOSIS — Z992 Dependence on renal dialysis: Secondary | ICD-10-CM | POA: Diagnosis not present

## 2018-11-07 DIAGNOSIS — N2581 Secondary hyperparathyroidism of renal origin: Secondary | ICD-10-CM | POA: Diagnosis not present

## 2018-11-09 DIAGNOSIS — D631 Anemia in chronic kidney disease: Secondary | ICD-10-CM | POA: Diagnosis not present

## 2018-11-09 DIAGNOSIS — N2581 Secondary hyperparathyroidism of renal origin: Secondary | ICD-10-CM | POA: Diagnosis not present

## 2018-11-09 DIAGNOSIS — D509 Iron deficiency anemia, unspecified: Secondary | ICD-10-CM | POA: Diagnosis not present

## 2018-11-09 DIAGNOSIS — Z992 Dependence on renal dialysis: Secondary | ICD-10-CM | POA: Diagnosis not present

## 2018-11-09 DIAGNOSIS — N186 End stage renal disease: Secondary | ICD-10-CM | POA: Diagnosis not present

## 2018-11-11 DIAGNOSIS — D509 Iron deficiency anemia, unspecified: Secondary | ICD-10-CM | POA: Diagnosis not present

## 2018-11-11 DIAGNOSIS — N186 End stage renal disease: Secondary | ICD-10-CM | POA: Diagnosis not present

## 2018-11-11 DIAGNOSIS — Z992 Dependence on renal dialysis: Secondary | ICD-10-CM | POA: Diagnosis not present

## 2018-11-11 DIAGNOSIS — D631 Anemia in chronic kidney disease: Secondary | ICD-10-CM | POA: Diagnosis not present

## 2018-11-11 DIAGNOSIS — N2581 Secondary hyperparathyroidism of renal origin: Secondary | ICD-10-CM | POA: Diagnosis not present

## 2018-11-14 DIAGNOSIS — D509 Iron deficiency anemia, unspecified: Secondary | ICD-10-CM | POA: Diagnosis not present

## 2018-11-14 DIAGNOSIS — N186 End stage renal disease: Secondary | ICD-10-CM | POA: Diagnosis not present

## 2018-11-14 DIAGNOSIS — D631 Anemia in chronic kidney disease: Secondary | ICD-10-CM | POA: Diagnosis not present

## 2018-11-14 DIAGNOSIS — N2581 Secondary hyperparathyroidism of renal origin: Secondary | ICD-10-CM | POA: Diagnosis not present

## 2018-11-14 DIAGNOSIS — Z992 Dependence on renal dialysis: Secondary | ICD-10-CM | POA: Diagnosis not present

## 2018-11-16 DIAGNOSIS — D509 Iron deficiency anemia, unspecified: Secondary | ICD-10-CM | POA: Diagnosis not present

## 2018-11-16 DIAGNOSIS — D631 Anemia in chronic kidney disease: Secondary | ICD-10-CM | POA: Diagnosis not present

## 2018-11-16 DIAGNOSIS — N2581 Secondary hyperparathyroidism of renal origin: Secondary | ICD-10-CM | POA: Diagnosis not present

## 2018-11-16 DIAGNOSIS — Z992 Dependence on renal dialysis: Secondary | ICD-10-CM | POA: Diagnosis not present

## 2018-11-16 DIAGNOSIS — N186 End stage renal disease: Secondary | ICD-10-CM | POA: Diagnosis not present

## 2018-11-18 DIAGNOSIS — D631 Anemia in chronic kidney disease: Secondary | ICD-10-CM | POA: Diagnosis not present

## 2018-11-18 DIAGNOSIS — D509 Iron deficiency anemia, unspecified: Secondary | ICD-10-CM | POA: Diagnosis not present

## 2018-11-18 DIAGNOSIS — N186 End stage renal disease: Secondary | ICD-10-CM | POA: Diagnosis not present

## 2018-11-18 DIAGNOSIS — N2581 Secondary hyperparathyroidism of renal origin: Secondary | ICD-10-CM | POA: Diagnosis not present

## 2018-11-18 DIAGNOSIS — Z992 Dependence on renal dialysis: Secondary | ICD-10-CM | POA: Diagnosis not present

## 2018-11-19 DIAGNOSIS — N186 End stage renal disease: Secondary | ICD-10-CM | POA: Diagnosis not present

## 2018-11-19 DIAGNOSIS — Z992 Dependence on renal dialysis: Secondary | ICD-10-CM | POA: Diagnosis not present

## 2018-11-21 DIAGNOSIS — N2581 Secondary hyperparathyroidism of renal origin: Secondary | ICD-10-CM | POA: Diagnosis not present

## 2018-11-21 DIAGNOSIS — D509 Iron deficiency anemia, unspecified: Secondary | ICD-10-CM | POA: Diagnosis not present

## 2018-11-21 DIAGNOSIS — D631 Anemia in chronic kidney disease: Secondary | ICD-10-CM | POA: Diagnosis not present

## 2018-11-21 DIAGNOSIS — Z992 Dependence on renal dialysis: Secondary | ICD-10-CM | POA: Diagnosis not present

## 2018-11-21 DIAGNOSIS — N186 End stage renal disease: Secondary | ICD-10-CM | POA: Diagnosis not present

## 2018-11-23 DIAGNOSIS — D509 Iron deficiency anemia, unspecified: Secondary | ICD-10-CM | POA: Diagnosis not present

## 2018-11-23 DIAGNOSIS — N2581 Secondary hyperparathyroidism of renal origin: Secondary | ICD-10-CM | POA: Diagnosis not present

## 2018-11-23 DIAGNOSIS — N186 End stage renal disease: Secondary | ICD-10-CM | POA: Diagnosis not present

## 2018-11-23 DIAGNOSIS — D631 Anemia in chronic kidney disease: Secondary | ICD-10-CM | POA: Diagnosis not present

## 2018-11-23 DIAGNOSIS — Z992 Dependence on renal dialysis: Secondary | ICD-10-CM | POA: Diagnosis not present

## 2018-11-25 DIAGNOSIS — D631 Anemia in chronic kidney disease: Secondary | ICD-10-CM | POA: Diagnosis not present

## 2018-11-25 DIAGNOSIS — N2581 Secondary hyperparathyroidism of renal origin: Secondary | ICD-10-CM | POA: Diagnosis not present

## 2018-11-25 DIAGNOSIS — Z992 Dependence on renal dialysis: Secondary | ICD-10-CM | POA: Diagnosis not present

## 2018-11-25 DIAGNOSIS — N186 End stage renal disease: Secondary | ICD-10-CM | POA: Diagnosis not present

## 2018-11-25 DIAGNOSIS — D509 Iron deficiency anemia, unspecified: Secondary | ICD-10-CM | POA: Diagnosis not present

## 2018-11-28 DIAGNOSIS — N186 End stage renal disease: Secondary | ICD-10-CM | POA: Diagnosis not present

## 2018-11-28 DIAGNOSIS — D631 Anemia in chronic kidney disease: Secondary | ICD-10-CM | POA: Diagnosis not present

## 2018-11-28 DIAGNOSIS — N2581 Secondary hyperparathyroidism of renal origin: Secondary | ICD-10-CM | POA: Diagnosis not present

## 2018-11-28 DIAGNOSIS — D509 Iron deficiency anemia, unspecified: Secondary | ICD-10-CM | POA: Diagnosis not present

## 2018-11-28 DIAGNOSIS — Z992 Dependence on renal dialysis: Secondary | ICD-10-CM | POA: Diagnosis not present

## 2018-11-30 DIAGNOSIS — N2581 Secondary hyperparathyroidism of renal origin: Secondary | ICD-10-CM | POA: Diagnosis not present

## 2018-11-30 DIAGNOSIS — N186 End stage renal disease: Secondary | ICD-10-CM | POA: Diagnosis not present

## 2018-11-30 DIAGNOSIS — D509 Iron deficiency anemia, unspecified: Secondary | ICD-10-CM | POA: Diagnosis not present

## 2018-11-30 DIAGNOSIS — Z992 Dependence on renal dialysis: Secondary | ICD-10-CM | POA: Diagnosis not present

## 2018-11-30 DIAGNOSIS — D631 Anemia in chronic kidney disease: Secondary | ICD-10-CM | POA: Diagnosis not present

## 2018-12-02 DIAGNOSIS — Z992 Dependence on renal dialysis: Secondary | ICD-10-CM | POA: Diagnosis not present

## 2018-12-02 DIAGNOSIS — N2581 Secondary hyperparathyroidism of renal origin: Secondary | ICD-10-CM | POA: Diagnosis not present

## 2018-12-02 DIAGNOSIS — N186 End stage renal disease: Secondary | ICD-10-CM | POA: Diagnosis not present

## 2018-12-02 DIAGNOSIS — D509 Iron deficiency anemia, unspecified: Secondary | ICD-10-CM | POA: Diagnosis not present

## 2018-12-02 DIAGNOSIS — D631 Anemia in chronic kidney disease: Secondary | ICD-10-CM | POA: Diagnosis not present

## 2018-12-05 DIAGNOSIS — N2581 Secondary hyperparathyroidism of renal origin: Secondary | ICD-10-CM | POA: Diagnosis not present

## 2018-12-05 DIAGNOSIS — D631 Anemia in chronic kidney disease: Secondary | ICD-10-CM | POA: Diagnosis not present

## 2018-12-05 DIAGNOSIS — Z992 Dependence on renal dialysis: Secondary | ICD-10-CM | POA: Diagnosis not present

## 2018-12-05 DIAGNOSIS — N186 End stage renal disease: Secondary | ICD-10-CM | POA: Diagnosis not present

## 2018-12-05 DIAGNOSIS — D509 Iron deficiency anemia, unspecified: Secondary | ICD-10-CM | POA: Diagnosis not present

## 2018-12-07 DIAGNOSIS — D631 Anemia in chronic kidney disease: Secondary | ICD-10-CM | POA: Diagnosis not present

## 2018-12-07 DIAGNOSIS — N2581 Secondary hyperparathyroidism of renal origin: Secondary | ICD-10-CM | POA: Diagnosis not present

## 2018-12-07 DIAGNOSIS — N186 End stage renal disease: Secondary | ICD-10-CM | POA: Diagnosis not present

## 2018-12-07 DIAGNOSIS — Z992 Dependence on renal dialysis: Secondary | ICD-10-CM | POA: Diagnosis not present

## 2018-12-07 DIAGNOSIS — D509 Iron deficiency anemia, unspecified: Secondary | ICD-10-CM | POA: Diagnosis not present

## 2018-12-09 DIAGNOSIS — D631 Anemia in chronic kidney disease: Secondary | ICD-10-CM | POA: Diagnosis not present

## 2018-12-09 DIAGNOSIS — N186 End stage renal disease: Secondary | ICD-10-CM | POA: Diagnosis not present

## 2018-12-09 DIAGNOSIS — D509 Iron deficiency anemia, unspecified: Secondary | ICD-10-CM | POA: Diagnosis not present

## 2018-12-09 DIAGNOSIS — N2581 Secondary hyperparathyroidism of renal origin: Secondary | ICD-10-CM | POA: Diagnosis not present

## 2018-12-09 DIAGNOSIS — Z992 Dependence on renal dialysis: Secondary | ICD-10-CM | POA: Diagnosis not present

## 2018-12-12 DIAGNOSIS — N2581 Secondary hyperparathyroidism of renal origin: Secondary | ICD-10-CM | POA: Diagnosis not present

## 2018-12-12 DIAGNOSIS — D509 Iron deficiency anemia, unspecified: Secondary | ICD-10-CM | POA: Diagnosis not present

## 2018-12-12 DIAGNOSIS — Z992 Dependence on renal dialysis: Secondary | ICD-10-CM | POA: Diagnosis not present

## 2018-12-12 DIAGNOSIS — D631 Anemia in chronic kidney disease: Secondary | ICD-10-CM | POA: Diagnosis not present

## 2018-12-12 DIAGNOSIS — N186 End stage renal disease: Secondary | ICD-10-CM | POA: Diagnosis not present

## 2018-12-15 DIAGNOSIS — N2581 Secondary hyperparathyroidism of renal origin: Secondary | ICD-10-CM | POA: Diagnosis not present

## 2018-12-15 DIAGNOSIS — N186 End stage renal disease: Secondary | ICD-10-CM | POA: Diagnosis not present

## 2018-12-15 DIAGNOSIS — D509 Iron deficiency anemia, unspecified: Secondary | ICD-10-CM | POA: Diagnosis not present

## 2018-12-15 DIAGNOSIS — Z992 Dependence on renal dialysis: Secondary | ICD-10-CM | POA: Diagnosis not present

## 2018-12-15 DIAGNOSIS — D631 Anemia in chronic kidney disease: Secondary | ICD-10-CM | POA: Diagnosis not present

## 2018-12-16 DIAGNOSIS — D509 Iron deficiency anemia, unspecified: Secondary | ICD-10-CM | POA: Diagnosis not present

## 2018-12-16 DIAGNOSIS — N2581 Secondary hyperparathyroidism of renal origin: Secondary | ICD-10-CM | POA: Diagnosis not present

## 2018-12-16 DIAGNOSIS — Z992 Dependence on renal dialysis: Secondary | ICD-10-CM | POA: Diagnosis not present

## 2018-12-16 DIAGNOSIS — N186 End stage renal disease: Secondary | ICD-10-CM | POA: Diagnosis not present

## 2018-12-16 DIAGNOSIS — D631 Anemia in chronic kidney disease: Secondary | ICD-10-CM | POA: Diagnosis not present

## 2018-12-19 DIAGNOSIS — Z992 Dependence on renal dialysis: Secondary | ICD-10-CM | POA: Diagnosis not present

## 2018-12-19 DIAGNOSIS — N186 End stage renal disease: Secondary | ICD-10-CM | POA: Diagnosis not present

## 2018-12-19 DIAGNOSIS — D509 Iron deficiency anemia, unspecified: Secondary | ICD-10-CM | POA: Diagnosis not present

## 2018-12-19 DIAGNOSIS — D631 Anemia in chronic kidney disease: Secondary | ICD-10-CM | POA: Diagnosis not present

## 2018-12-19 DIAGNOSIS — N2581 Secondary hyperparathyroidism of renal origin: Secondary | ICD-10-CM | POA: Diagnosis not present

## 2018-12-20 DIAGNOSIS — Z992 Dependence on renal dialysis: Secondary | ICD-10-CM | POA: Diagnosis not present

## 2018-12-20 DIAGNOSIS — N186 End stage renal disease: Secondary | ICD-10-CM | POA: Diagnosis not present

## 2018-12-21 DIAGNOSIS — D509 Iron deficiency anemia, unspecified: Secondary | ICD-10-CM | POA: Diagnosis not present

## 2018-12-21 DIAGNOSIS — N186 End stage renal disease: Secondary | ICD-10-CM | POA: Diagnosis not present

## 2018-12-21 DIAGNOSIS — N2581 Secondary hyperparathyroidism of renal origin: Secondary | ICD-10-CM | POA: Diagnosis not present

## 2018-12-21 DIAGNOSIS — Z992 Dependence on renal dialysis: Secondary | ICD-10-CM | POA: Diagnosis not present

## 2018-12-21 DIAGNOSIS — D631 Anemia in chronic kidney disease: Secondary | ICD-10-CM | POA: Diagnosis not present

## 2018-12-23 DIAGNOSIS — N2581 Secondary hyperparathyroidism of renal origin: Secondary | ICD-10-CM | POA: Diagnosis not present

## 2018-12-23 DIAGNOSIS — N186 End stage renal disease: Secondary | ICD-10-CM | POA: Diagnosis not present

## 2018-12-23 DIAGNOSIS — D509 Iron deficiency anemia, unspecified: Secondary | ICD-10-CM | POA: Diagnosis not present

## 2018-12-23 DIAGNOSIS — Z992 Dependence on renal dialysis: Secondary | ICD-10-CM | POA: Diagnosis not present

## 2018-12-23 DIAGNOSIS — D631 Anemia in chronic kidney disease: Secondary | ICD-10-CM | POA: Diagnosis not present

## 2018-12-26 DIAGNOSIS — N186 End stage renal disease: Secondary | ICD-10-CM | POA: Diagnosis not present

## 2018-12-26 DIAGNOSIS — D509 Iron deficiency anemia, unspecified: Secondary | ICD-10-CM | POA: Diagnosis not present

## 2018-12-26 DIAGNOSIS — N2581 Secondary hyperparathyroidism of renal origin: Secondary | ICD-10-CM | POA: Diagnosis not present

## 2018-12-26 DIAGNOSIS — D631 Anemia in chronic kidney disease: Secondary | ICD-10-CM | POA: Diagnosis not present

## 2018-12-26 DIAGNOSIS — Z992 Dependence on renal dialysis: Secondary | ICD-10-CM | POA: Diagnosis not present

## 2018-12-28 DIAGNOSIS — D631 Anemia in chronic kidney disease: Secondary | ICD-10-CM | POA: Diagnosis not present

## 2018-12-28 DIAGNOSIS — N186 End stage renal disease: Secondary | ICD-10-CM | POA: Diagnosis not present

## 2018-12-28 DIAGNOSIS — D509 Iron deficiency anemia, unspecified: Secondary | ICD-10-CM | POA: Diagnosis not present

## 2018-12-28 DIAGNOSIS — N2581 Secondary hyperparathyroidism of renal origin: Secondary | ICD-10-CM | POA: Diagnosis not present

## 2018-12-28 DIAGNOSIS — Z992 Dependence on renal dialysis: Secondary | ICD-10-CM | POA: Diagnosis not present

## 2018-12-29 DIAGNOSIS — N186 End stage renal disease: Secondary | ICD-10-CM | POA: Diagnosis not present

## 2018-12-29 DIAGNOSIS — T82898A Other specified complication of vascular prosthetic devices, implants and grafts, initial encounter: Secondary | ICD-10-CM | POA: Diagnosis not present

## 2018-12-29 DIAGNOSIS — T82858A Stenosis of vascular prosthetic devices, implants and grafts, initial encounter: Secondary | ICD-10-CM | POA: Diagnosis not present

## 2018-12-29 DIAGNOSIS — I871 Compression of vein: Secondary | ICD-10-CM | POA: Diagnosis not present

## 2018-12-29 DIAGNOSIS — Z992 Dependence on renal dialysis: Secondary | ICD-10-CM | POA: Diagnosis not present

## 2018-12-30 DIAGNOSIS — D509 Iron deficiency anemia, unspecified: Secondary | ICD-10-CM | POA: Diagnosis not present

## 2018-12-30 DIAGNOSIS — D631 Anemia in chronic kidney disease: Secondary | ICD-10-CM | POA: Diagnosis not present

## 2018-12-30 DIAGNOSIS — N186 End stage renal disease: Secondary | ICD-10-CM | POA: Diagnosis not present

## 2018-12-30 DIAGNOSIS — N2581 Secondary hyperparathyroidism of renal origin: Secondary | ICD-10-CM | POA: Diagnosis not present

## 2018-12-30 DIAGNOSIS — Z992 Dependence on renal dialysis: Secondary | ICD-10-CM | POA: Diagnosis not present

## 2019-01-02 DIAGNOSIS — N2581 Secondary hyperparathyroidism of renal origin: Secondary | ICD-10-CM | POA: Diagnosis not present

## 2019-01-02 DIAGNOSIS — D509 Iron deficiency anemia, unspecified: Secondary | ICD-10-CM | POA: Diagnosis not present

## 2019-01-02 DIAGNOSIS — Z992 Dependence on renal dialysis: Secondary | ICD-10-CM | POA: Diagnosis not present

## 2019-01-02 DIAGNOSIS — N186 End stage renal disease: Secondary | ICD-10-CM | POA: Diagnosis not present

## 2019-01-02 DIAGNOSIS — D631 Anemia in chronic kidney disease: Secondary | ICD-10-CM | POA: Diagnosis not present

## 2019-01-04 DIAGNOSIS — D631 Anemia in chronic kidney disease: Secondary | ICD-10-CM | POA: Diagnosis not present

## 2019-01-04 DIAGNOSIS — N186 End stage renal disease: Secondary | ICD-10-CM | POA: Diagnosis not present

## 2019-01-04 DIAGNOSIS — D509 Iron deficiency anemia, unspecified: Secondary | ICD-10-CM | POA: Diagnosis not present

## 2019-01-04 DIAGNOSIS — Z992 Dependence on renal dialysis: Secondary | ICD-10-CM | POA: Diagnosis not present

## 2019-01-04 DIAGNOSIS — N2581 Secondary hyperparathyroidism of renal origin: Secondary | ICD-10-CM | POA: Diagnosis not present

## 2019-01-06 DIAGNOSIS — D509 Iron deficiency anemia, unspecified: Secondary | ICD-10-CM | POA: Diagnosis not present

## 2019-01-06 DIAGNOSIS — N186 End stage renal disease: Secondary | ICD-10-CM | POA: Diagnosis not present

## 2019-01-06 DIAGNOSIS — D631 Anemia in chronic kidney disease: Secondary | ICD-10-CM | POA: Diagnosis not present

## 2019-01-06 DIAGNOSIS — Z992 Dependence on renal dialysis: Secondary | ICD-10-CM | POA: Diagnosis not present

## 2019-01-06 DIAGNOSIS — N2581 Secondary hyperparathyroidism of renal origin: Secondary | ICD-10-CM | POA: Diagnosis not present

## 2019-01-09 DIAGNOSIS — D631 Anemia in chronic kidney disease: Secondary | ICD-10-CM | POA: Diagnosis not present

## 2019-01-09 DIAGNOSIS — D509 Iron deficiency anemia, unspecified: Secondary | ICD-10-CM | POA: Diagnosis not present

## 2019-01-09 DIAGNOSIS — Z992 Dependence on renal dialysis: Secondary | ICD-10-CM | POA: Diagnosis not present

## 2019-01-09 DIAGNOSIS — N186 End stage renal disease: Secondary | ICD-10-CM | POA: Diagnosis not present

## 2019-01-09 DIAGNOSIS — N2581 Secondary hyperparathyroidism of renal origin: Secondary | ICD-10-CM | POA: Diagnosis not present

## 2019-01-11 DIAGNOSIS — N2581 Secondary hyperparathyroidism of renal origin: Secondary | ICD-10-CM | POA: Diagnosis not present

## 2019-01-11 DIAGNOSIS — D509 Iron deficiency anemia, unspecified: Secondary | ICD-10-CM | POA: Diagnosis not present

## 2019-01-11 DIAGNOSIS — D631 Anemia in chronic kidney disease: Secondary | ICD-10-CM | POA: Diagnosis not present

## 2019-01-11 DIAGNOSIS — N186 End stage renal disease: Secondary | ICD-10-CM | POA: Diagnosis not present

## 2019-01-11 DIAGNOSIS — Z992 Dependence on renal dialysis: Secondary | ICD-10-CM | POA: Diagnosis not present

## 2019-01-13 DIAGNOSIS — D631 Anemia in chronic kidney disease: Secondary | ICD-10-CM | POA: Diagnosis not present

## 2019-01-13 DIAGNOSIS — N2581 Secondary hyperparathyroidism of renal origin: Secondary | ICD-10-CM | POA: Diagnosis not present

## 2019-01-13 DIAGNOSIS — N186 End stage renal disease: Secondary | ICD-10-CM | POA: Diagnosis not present

## 2019-01-13 DIAGNOSIS — D509 Iron deficiency anemia, unspecified: Secondary | ICD-10-CM | POA: Diagnosis not present

## 2019-01-13 DIAGNOSIS — Z992 Dependence on renal dialysis: Secondary | ICD-10-CM | POA: Diagnosis not present

## 2019-01-16 DIAGNOSIS — N2581 Secondary hyperparathyroidism of renal origin: Secondary | ICD-10-CM | POA: Diagnosis not present

## 2019-01-16 DIAGNOSIS — Z992 Dependence on renal dialysis: Secondary | ICD-10-CM | POA: Diagnosis not present

## 2019-01-16 DIAGNOSIS — D509 Iron deficiency anemia, unspecified: Secondary | ICD-10-CM | POA: Diagnosis not present

## 2019-01-16 DIAGNOSIS — D631 Anemia in chronic kidney disease: Secondary | ICD-10-CM | POA: Diagnosis not present

## 2019-01-16 DIAGNOSIS — N186 End stage renal disease: Secondary | ICD-10-CM | POA: Diagnosis not present

## 2019-01-18 DIAGNOSIS — D631 Anemia in chronic kidney disease: Secondary | ICD-10-CM | POA: Diagnosis not present

## 2019-01-18 DIAGNOSIS — N2581 Secondary hyperparathyroidism of renal origin: Secondary | ICD-10-CM | POA: Diagnosis not present

## 2019-01-18 DIAGNOSIS — D509 Iron deficiency anemia, unspecified: Secondary | ICD-10-CM | POA: Diagnosis not present

## 2019-01-18 DIAGNOSIS — Z992 Dependence on renal dialysis: Secondary | ICD-10-CM | POA: Diagnosis not present

## 2019-01-18 DIAGNOSIS — N186 End stage renal disease: Secondary | ICD-10-CM | POA: Diagnosis not present

## 2019-01-20 DIAGNOSIS — N2581 Secondary hyperparathyroidism of renal origin: Secondary | ICD-10-CM | POA: Diagnosis not present

## 2019-01-20 DIAGNOSIS — D631 Anemia in chronic kidney disease: Secondary | ICD-10-CM | POA: Diagnosis not present

## 2019-01-20 DIAGNOSIS — Z992 Dependence on renal dialysis: Secondary | ICD-10-CM | POA: Diagnosis not present

## 2019-01-20 DIAGNOSIS — N186 End stage renal disease: Secondary | ICD-10-CM | POA: Diagnosis not present

## 2019-01-20 DIAGNOSIS — D509 Iron deficiency anemia, unspecified: Secondary | ICD-10-CM | POA: Diagnosis not present

## 2019-01-23 DIAGNOSIS — N2581 Secondary hyperparathyroidism of renal origin: Secondary | ICD-10-CM | POA: Diagnosis not present

## 2019-01-23 DIAGNOSIS — Z992 Dependence on renal dialysis: Secondary | ICD-10-CM | POA: Diagnosis not present

## 2019-01-23 DIAGNOSIS — D631 Anemia in chronic kidney disease: Secondary | ICD-10-CM | POA: Diagnosis not present

## 2019-01-23 DIAGNOSIS — D509 Iron deficiency anemia, unspecified: Secondary | ICD-10-CM | POA: Diagnosis not present

## 2019-01-23 DIAGNOSIS — N186 End stage renal disease: Secondary | ICD-10-CM | POA: Diagnosis not present

## 2019-01-25 DIAGNOSIS — N186 End stage renal disease: Secondary | ICD-10-CM | POA: Diagnosis not present

## 2019-01-25 DIAGNOSIS — Z992 Dependence on renal dialysis: Secondary | ICD-10-CM | POA: Diagnosis not present

## 2019-01-25 DIAGNOSIS — N2581 Secondary hyperparathyroidism of renal origin: Secondary | ICD-10-CM | POA: Diagnosis not present

## 2019-01-25 DIAGNOSIS — E119 Type 2 diabetes mellitus without complications: Secondary | ICD-10-CM | POA: Diagnosis not present

## 2019-01-25 DIAGNOSIS — Z794 Long term (current) use of insulin: Secondary | ICD-10-CM | POA: Diagnosis not present

## 2019-01-25 DIAGNOSIS — D509 Iron deficiency anemia, unspecified: Secondary | ICD-10-CM | POA: Diagnosis not present

## 2019-01-25 DIAGNOSIS — D631 Anemia in chronic kidney disease: Secondary | ICD-10-CM | POA: Diagnosis not present

## 2019-01-27 DIAGNOSIS — N186 End stage renal disease: Secondary | ICD-10-CM | POA: Diagnosis not present

## 2019-01-27 DIAGNOSIS — N2581 Secondary hyperparathyroidism of renal origin: Secondary | ICD-10-CM | POA: Diagnosis not present

## 2019-01-27 DIAGNOSIS — D509 Iron deficiency anemia, unspecified: Secondary | ICD-10-CM | POA: Diagnosis not present

## 2019-01-27 DIAGNOSIS — D631 Anemia in chronic kidney disease: Secondary | ICD-10-CM | POA: Diagnosis not present

## 2019-01-27 DIAGNOSIS — Z992 Dependence on renal dialysis: Secondary | ICD-10-CM | POA: Diagnosis not present

## 2019-01-30 DIAGNOSIS — D509 Iron deficiency anemia, unspecified: Secondary | ICD-10-CM | POA: Diagnosis not present

## 2019-01-30 DIAGNOSIS — D631 Anemia in chronic kidney disease: Secondary | ICD-10-CM | POA: Diagnosis not present

## 2019-01-30 DIAGNOSIS — N186 End stage renal disease: Secondary | ICD-10-CM | POA: Diagnosis not present

## 2019-01-30 DIAGNOSIS — Z992 Dependence on renal dialysis: Secondary | ICD-10-CM | POA: Diagnosis not present

## 2019-01-30 DIAGNOSIS — N2581 Secondary hyperparathyroidism of renal origin: Secondary | ICD-10-CM | POA: Diagnosis not present

## 2019-02-01 DIAGNOSIS — N2581 Secondary hyperparathyroidism of renal origin: Secondary | ICD-10-CM | POA: Diagnosis not present

## 2019-02-01 DIAGNOSIS — N186 End stage renal disease: Secondary | ICD-10-CM | POA: Diagnosis not present

## 2019-02-01 DIAGNOSIS — D509 Iron deficiency anemia, unspecified: Secondary | ICD-10-CM | POA: Diagnosis not present

## 2019-02-01 DIAGNOSIS — D631 Anemia in chronic kidney disease: Secondary | ICD-10-CM | POA: Diagnosis not present

## 2019-02-01 DIAGNOSIS — Z992 Dependence on renal dialysis: Secondary | ICD-10-CM | POA: Diagnosis not present

## 2019-02-02 DIAGNOSIS — I10 Essential (primary) hypertension: Secondary | ICD-10-CM | POA: Diagnosis not present

## 2019-02-02 DIAGNOSIS — I5022 Chronic systolic (congestive) heart failure: Secondary | ICD-10-CM | POA: Diagnosis not present

## 2019-02-02 DIAGNOSIS — I255 Ischemic cardiomyopathy: Secondary | ICD-10-CM | POA: Diagnosis not present

## 2019-02-03 DIAGNOSIS — N186 End stage renal disease: Secondary | ICD-10-CM | POA: Diagnosis not present

## 2019-02-03 DIAGNOSIS — D631 Anemia in chronic kidney disease: Secondary | ICD-10-CM | POA: Diagnosis not present

## 2019-02-03 DIAGNOSIS — D509 Iron deficiency anemia, unspecified: Secondary | ICD-10-CM | POA: Diagnosis not present

## 2019-02-03 DIAGNOSIS — N2581 Secondary hyperparathyroidism of renal origin: Secondary | ICD-10-CM | POA: Diagnosis not present

## 2019-02-03 DIAGNOSIS — Z992 Dependence on renal dialysis: Secondary | ICD-10-CM | POA: Diagnosis not present

## 2019-02-06 DIAGNOSIS — D509 Iron deficiency anemia, unspecified: Secondary | ICD-10-CM | POA: Diagnosis not present

## 2019-02-06 DIAGNOSIS — N2581 Secondary hyperparathyroidism of renal origin: Secondary | ICD-10-CM | POA: Diagnosis not present

## 2019-02-06 DIAGNOSIS — N186 End stage renal disease: Secondary | ICD-10-CM | POA: Diagnosis not present

## 2019-02-06 DIAGNOSIS — Z992 Dependence on renal dialysis: Secondary | ICD-10-CM | POA: Diagnosis not present

## 2019-02-06 DIAGNOSIS — D631 Anemia in chronic kidney disease: Secondary | ICD-10-CM | POA: Diagnosis not present

## 2019-02-08 DIAGNOSIS — Z992 Dependence on renal dialysis: Secondary | ICD-10-CM | POA: Diagnosis not present

## 2019-02-08 DIAGNOSIS — N186 End stage renal disease: Secondary | ICD-10-CM | POA: Diagnosis not present

## 2019-02-08 DIAGNOSIS — N2581 Secondary hyperparathyroidism of renal origin: Secondary | ICD-10-CM | POA: Diagnosis not present

## 2019-02-08 DIAGNOSIS — D631 Anemia in chronic kidney disease: Secondary | ICD-10-CM | POA: Diagnosis not present

## 2019-02-08 DIAGNOSIS — D509 Iron deficiency anemia, unspecified: Secondary | ICD-10-CM | POA: Diagnosis not present

## 2019-02-10 DIAGNOSIS — N2581 Secondary hyperparathyroidism of renal origin: Secondary | ICD-10-CM | POA: Diagnosis not present

## 2019-02-10 DIAGNOSIS — D509 Iron deficiency anemia, unspecified: Secondary | ICD-10-CM | POA: Diagnosis not present

## 2019-02-10 DIAGNOSIS — D631 Anemia in chronic kidney disease: Secondary | ICD-10-CM | POA: Diagnosis not present

## 2019-02-10 DIAGNOSIS — N186 End stage renal disease: Secondary | ICD-10-CM | POA: Diagnosis not present

## 2019-02-10 DIAGNOSIS — Z992 Dependence on renal dialysis: Secondary | ICD-10-CM | POA: Diagnosis not present

## 2019-02-13 DIAGNOSIS — Z992 Dependence on renal dialysis: Secondary | ICD-10-CM | POA: Diagnosis not present

## 2019-02-13 DIAGNOSIS — N186 End stage renal disease: Secondary | ICD-10-CM | POA: Diagnosis not present

## 2019-02-13 DIAGNOSIS — D631 Anemia in chronic kidney disease: Secondary | ICD-10-CM | POA: Diagnosis not present

## 2019-02-13 DIAGNOSIS — N2581 Secondary hyperparathyroidism of renal origin: Secondary | ICD-10-CM | POA: Diagnosis not present

## 2019-02-13 DIAGNOSIS — D509 Iron deficiency anemia, unspecified: Secondary | ICD-10-CM | POA: Diagnosis not present

## 2019-02-15 DIAGNOSIS — N2581 Secondary hyperparathyroidism of renal origin: Secondary | ICD-10-CM | POA: Diagnosis not present

## 2019-02-15 DIAGNOSIS — D631 Anemia in chronic kidney disease: Secondary | ICD-10-CM | POA: Diagnosis not present

## 2019-02-15 DIAGNOSIS — Z992 Dependence on renal dialysis: Secondary | ICD-10-CM | POA: Diagnosis not present

## 2019-02-15 DIAGNOSIS — D509 Iron deficiency anemia, unspecified: Secondary | ICD-10-CM | POA: Diagnosis not present

## 2019-02-15 DIAGNOSIS — N186 End stage renal disease: Secondary | ICD-10-CM | POA: Diagnosis not present

## 2019-02-17 DIAGNOSIS — Z992 Dependence on renal dialysis: Secondary | ICD-10-CM | POA: Diagnosis not present

## 2019-02-17 DIAGNOSIS — D631 Anemia in chronic kidney disease: Secondary | ICD-10-CM | POA: Diagnosis not present

## 2019-02-17 DIAGNOSIS — N2581 Secondary hyperparathyroidism of renal origin: Secondary | ICD-10-CM | POA: Diagnosis not present

## 2019-02-17 DIAGNOSIS — N186 End stage renal disease: Secondary | ICD-10-CM | POA: Diagnosis not present

## 2019-02-17 DIAGNOSIS — D509 Iron deficiency anemia, unspecified: Secondary | ICD-10-CM | POA: Diagnosis not present

## 2019-02-18 DIAGNOSIS — Z992 Dependence on renal dialysis: Secondary | ICD-10-CM | POA: Diagnosis not present

## 2019-02-18 DIAGNOSIS — N186 End stage renal disease: Secondary | ICD-10-CM | POA: Diagnosis not present

## 2019-02-20 DIAGNOSIS — D631 Anemia in chronic kidney disease: Secondary | ICD-10-CM | POA: Diagnosis not present

## 2019-02-20 DIAGNOSIS — N186 End stage renal disease: Secondary | ICD-10-CM | POA: Diagnosis not present

## 2019-02-20 DIAGNOSIS — N2581 Secondary hyperparathyroidism of renal origin: Secondary | ICD-10-CM | POA: Diagnosis not present

## 2019-02-20 DIAGNOSIS — Z992 Dependence on renal dialysis: Secondary | ICD-10-CM | POA: Diagnosis not present

## 2019-02-20 DIAGNOSIS — D509 Iron deficiency anemia, unspecified: Secondary | ICD-10-CM | POA: Diagnosis not present

## 2019-02-22 DIAGNOSIS — D631 Anemia in chronic kidney disease: Secondary | ICD-10-CM | POA: Diagnosis not present

## 2019-02-22 DIAGNOSIS — Z992 Dependence on renal dialysis: Secondary | ICD-10-CM | POA: Diagnosis not present

## 2019-02-22 DIAGNOSIS — N186 End stage renal disease: Secondary | ICD-10-CM | POA: Diagnosis not present

## 2019-02-22 DIAGNOSIS — D509 Iron deficiency anemia, unspecified: Secondary | ICD-10-CM | POA: Diagnosis not present

## 2019-02-22 DIAGNOSIS — N2581 Secondary hyperparathyroidism of renal origin: Secondary | ICD-10-CM | POA: Diagnosis not present

## 2019-02-24 DIAGNOSIS — D509 Iron deficiency anemia, unspecified: Secondary | ICD-10-CM | POA: Diagnosis not present

## 2019-02-24 DIAGNOSIS — D631 Anemia in chronic kidney disease: Secondary | ICD-10-CM | POA: Diagnosis not present

## 2019-02-24 DIAGNOSIS — N2581 Secondary hyperparathyroidism of renal origin: Secondary | ICD-10-CM | POA: Diagnosis not present

## 2019-02-24 DIAGNOSIS — N186 End stage renal disease: Secondary | ICD-10-CM | POA: Diagnosis not present

## 2019-02-24 DIAGNOSIS — Z992 Dependence on renal dialysis: Secondary | ICD-10-CM | POA: Diagnosis not present

## 2019-02-27 DIAGNOSIS — N186 End stage renal disease: Secondary | ICD-10-CM | POA: Diagnosis not present

## 2019-02-27 DIAGNOSIS — Z992 Dependence on renal dialysis: Secondary | ICD-10-CM | POA: Diagnosis not present

## 2019-02-27 DIAGNOSIS — D631 Anemia in chronic kidney disease: Secondary | ICD-10-CM | POA: Diagnosis not present

## 2019-02-27 DIAGNOSIS — N2581 Secondary hyperparathyroidism of renal origin: Secondary | ICD-10-CM | POA: Diagnosis not present

## 2019-02-27 DIAGNOSIS — D509 Iron deficiency anemia, unspecified: Secondary | ICD-10-CM | POA: Diagnosis not present

## 2019-03-01 DIAGNOSIS — D509 Iron deficiency anemia, unspecified: Secondary | ICD-10-CM | POA: Diagnosis not present

## 2019-03-01 DIAGNOSIS — Z992 Dependence on renal dialysis: Secondary | ICD-10-CM | POA: Diagnosis not present

## 2019-03-01 DIAGNOSIS — N186 End stage renal disease: Secondary | ICD-10-CM | POA: Diagnosis not present

## 2019-03-01 DIAGNOSIS — D631 Anemia in chronic kidney disease: Secondary | ICD-10-CM | POA: Diagnosis not present

## 2019-03-01 DIAGNOSIS — N2581 Secondary hyperparathyroidism of renal origin: Secondary | ICD-10-CM | POA: Diagnosis not present

## 2019-03-03 DIAGNOSIS — N2581 Secondary hyperparathyroidism of renal origin: Secondary | ICD-10-CM | POA: Diagnosis not present

## 2019-03-03 DIAGNOSIS — D509 Iron deficiency anemia, unspecified: Secondary | ICD-10-CM | POA: Diagnosis not present

## 2019-03-03 DIAGNOSIS — N186 End stage renal disease: Secondary | ICD-10-CM | POA: Diagnosis not present

## 2019-03-03 DIAGNOSIS — Z992 Dependence on renal dialysis: Secondary | ICD-10-CM | POA: Diagnosis not present

## 2019-03-03 DIAGNOSIS — D631 Anemia in chronic kidney disease: Secondary | ICD-10-CM | POA: Diagnosis not present

## 2019-03-06 DIAGNOSIS — N186 End stage renal disease: Secondary | ICD-10-CM | POA: Diagnosis not present

## 2019-03-06 DIAGNOSIS — Z992 Dependence on renal dialysis: Secondary | ICD-10-CM | POA: Diagnosis not present

## 2019-03-06 DIAGNOSIS — D631 Anemia in chronic kidney disease: Secondary | ICD-10-CM | POA: Diagnosis not present

## 2019-03-06 DIAGNOSIS — N2581 Secondary hyperparathyroidism of renal origin: Secondary | ICD-10-CM | POA: Diagnosis not present

## 2019-03-06 DIAGNOSIS — D509 Iron deficiency anemia, unspecified: Secondary | ICD-10-CM | POA: Diagnosis not present

## 2019-03-08 DIAGNOSIS — Z992 Dependence on renal dialysis: Secondary | ICD-10-CM | POA: Diagnosis not present

## 2019-03-08 DIAGNOSIS — N186 End stage renal disease: Secondary | ICD-10-CM | POA: Diagnosis not present

## 2019-03-08 DIAGNOSIS — N2581 Secondary hyperparathyroidism of renal origin: Secondary | ICD-10-CM | POA: Diagnosis not present

## 2019-03-08 DIAGNOSIS — D631 Anemia in chronic kidney disease: Secondary | ICD-10-CM | POA: Diagnosis not present

## 2019-03-08 DIAGNOSIS — D509 Iron deficiency anemia, unspecified: Secondary | ICD-10-CM | POA: Diagnosis not present

## 2019-03-10 DIAGNOSIS — D509 Iron deficiency anemia, unspecified: Secondary | ICD-10-CM | POA: Diagnosis not present

## 2019-03-10 DIAGNOSIS — N2581 Secondary hyperparathyroidism of renal origin: Secondary | ICD-10-CM | POA: Diagnosis not present

## 2019-03-10 DIAGNOSIS — N186 End stage renal disease: Secondary | ICD-10-CM | POA: Diagnosis not present

## 2019-03-10 DIAGNOSIS — D631 Anemia in chronic kidney disease: Secondary | ICD-10-CM | POA: Diagnosis not present

## 2019-03-10 DIAGNOSIS — Z992 Dependence on renal dialysis: Secondary | ICD-10-CM | POA: Diagnosis not present

## 2019-03-13 DIAGNOSIS — D631 Anemia in chronic kidney disease: Secondary | ICD-10-CM | POA: Diagnosis not present

## 2019-03-13 DIAGNOSIS — N2581 Secondary hyperparathyroidism of renal origin: Secondary | ICD-10-CM | POA: Diagnosis not present

## 2019-03-13 DIAGNOSIS — N186 End stage renal disease: Secondary | ICD-10-CM | POA: Diagnosis not present

## 2019-03-13 DIAGNOSIS — Z992 Dependence on renal dialysis: Secondary | ICD-10-CM | POA: Diagnosis not present

## 2019-03-13 DIAGNOSIS — D509 Iron deficiency anemia, unspecified: Secondary | ICD-10-CM | POA: Diagnosis not present

## 2019-03-15 DIAGNOSIS — D509 Iron deficiency anemia, unspecified: Secondary | ICD-10-CM | POA: Diagnosis not present

## 2019-03-15 DIAGNOSIS — Z992 Dependence on renal dialysis: Secondary | ICD-10-CM | POA: Diagnosis not present

## 2019-03-15 DIAGNOSIS — N2581 Secondary hyperparathyroidism of renal origin: Secondary | ICD-10-CM | POA: Diagnosis not present

## 2019-03-15 DIAGNOSIS — D631 Anemia in chronic kidney disease: Secondary | ICD-10-CM | POA: Diagnosis not present

## 2019-03-15 DIAGNOSIS — N186 End stage renal disease: Secondary | ICD-10-CM | POA: Diagnosis not present

## 2019-03-17 DIAGNOSIS — N2581 Secondary hyperparathyroidism of renal origin: Secondary | ICD-10-CM | POA: Diagnosis not present

## 2019-03-17 DIAGNOSIS — D509 Iron deficiency anemia, unspecified: Secondary | ICD-10-CM | POA: Diagnosis not present

## 2019-03-17 DIAGNOSIS — D631 Anemia in chronic kidney disease: Secondary | ICD-10-CM | POA: Diagnosis not present

## 2019-03-17 DIAGNOSIS — Z992 Dependence on renal dialysis: Secondary | ICD-10-CM | POA: Diagnosis not present

## 2019-03-17 DIAGNOSIS — N186 End stage renal disease: Secondary | ICD-10-CM | POA: Diagnosis not present

## 2019-03-20 DIAGNOSIS — D509 Iron deficiency anemia, unspecified: Secondary | ICD-10-CM | POA: Diagnosis not present

## 2019-03-20 DIAGNOSIS — Z992 Dependence on renal dialysis: Secondary | ICD-10-CM | POA: Diagnosis not present

## 2019-03-20 DIAGNOSIS — N186 End stage renal disease: Secondary | ICD-10-CM | POA: Diagnosis not present

## 2019-03-20 DIAGNOSIS — N2581 Secondary hyperparathyroidism of renal origin: Secondary | ICD-10-CM | POA: Diagnosis not present

## 2019-03-20 DIAGNOSIS — D631 Anemia in chronic kidney disease: Secondary | ICD-10-CM | POA: Diagnosis not present

## 2019-03-21 DIAGNOSIS — Z992 Dependence on renal dialysis: Secondary | ICD-10-CM | POA: Diagnosis not present

## 2019-03-21 DIAGNOSIS — N186 End stage renal disease: Secondary | ICD-10-CM | POA: Diagnosis not present

## 2019-03-22 DIAGNOSIS — Z992 Dependence on renal dialysis: Secondary | ICD-10-CM | POA: Diagnosis not present

## 2019-03-22 DIAGNOSIS — N186 End stage renal disease: Secondary | ICD-10-CM | POA: Diagnosis not present

## 2019-03-22 DIAGNOSIS — D631 Anemia in chronic kidney disease: Secondary | ICD-10-CM | POA: Diagnosis not present

## 2019-03-22 DIAGNOSIS — D509 Iron deficiency anemia, unspecified: Secondary | ICD-10-CM | POA: Diagnosis not present

## 2019-03-22 DIAGNOSIS — N2581 Secondary hyperparathyroidism of renal origin: Secondary | ICD-10-CM | POA: Diagnosis not present

## 2019-03-24 DIAGNOSIS — N186 End stage renal disease: Secondary | ICD-10-CM | POA: Diagnosis not present

## 2019-03-24 DIAGNOSIS — D631 Anemia in chronic kidney disease: Secondary | ICD-10-CM | POA: Diagnosis not present

## 2019-03-24 DIAGNOSIS — Z992 Dependence on renal dialysis: Secondary | ICD-10-CM | POA: Diagnosis not present

## 2019-03-24 DIAGNOSIS — N2581 Secondary hyperparathyroidism of renal origin: Secondary | ICD-10-CM | POA: Diagnosis not present

## 2019-03-24 DIAGNOSIS — D509 Iron deficiency anemia, unspecified: Secondary | ICD-10-CM | POA: Diagnosis not present

## 2019-03-27 DIAGNOSIS — D631 Anemia in chronic kidney disease: Secondary | ICD-10-CM | POA: Diagnosis not present

## 2019-03-27 DIAGNOSIS — D509 Iron deficiency anemia, unspecified: Secondary | ICD-10-CM | POA: Diagnosis not present

## 2019-03-27 DIAGNOSIS — N2581 Secondary hyperparathyroidism of renal origin: Secondary | ICD-10-CM | POA: Diagnosis not present

## 2019-03-27 DIAGNOSIS — Z992 Dependence on renal dialysis: Secondary | ICD-10-CM | POA: Diagnosis not present

## 2019-03-27 DIAGNOSIS — N186 End stage renal disease: Secondary | ICD-10-CM | POA: Diagnosis not present

## 2019-03-29 DIAGNOSIS — N2581 Secondary hyperparathyroidism of renal origin: Secondary | ICD-10-CM | POA: Diagnosis not present

## 2019-03-29 DIAGNOSIS — Z992 Dependence on renal dialysis: Secondary | ICD-10-CM | POA: Diagnosis not present

## 2019-03-29 DIAGNOSIS — D631 Anemia in chronic kidney disease: Secondary | ICD-10-CM | POA: Diagnosis not present

## 2019-03-29 DIAGNOSIS — D509 Iron deficiency anemia, unspecified: Secondary | ICD-10-CM | POA: Diagnosis not present

## 2019-03-29 DIAGNOSIS — N186 End stage renal disease: Secondary | ICD-10-CM | POA: Diagnosis not present

## 2019-03-31 DIAGNOSIS — D631 Anemia in chronic kidney disease: Secondary | ICD-10-CM | POA: Diagnosis not present

## 2019-03-31 DIAGNOSIS — N186 End stage renal disease: Secondary | ICD-10-CM | POA: Diagnosis not present

## 2019-03-31 DIAGNOSIS — Z992 Dependence on renal dialysis: Secondary | ICD-10-CM | POA: Diagnosis not present

## 2019-03-31 DIAGNOSIS — N2581 Secondary hyperparathyroidism of renal origin: Secondary | ICD-10-CM | POA: Diagnosis not present

## 2019-03-31 DIAGNOSIS — D509 Iron deficiency anemia, unspecified: Secondary | ICD-10-CM | POA: Diagnosis not present

## 2019-04-03 DIAGNOSIS — N186 End stage renal disease: Secondary | ICD-10-CM | POA: Diagnosis not present

## 2019-04-03 DIAGNOSIS — Z992 Dependence on renal dialysis: Secondary | ICD-10-CM | POA: Diagnosis not present

## 2019-04-03 DIAGNOSIS — D631 Anemia in chronic kidney disease: Secondary | ICD-10-CM | POA: Diagnosis not present

## 2019-04-03 DIAGNOSIS — N2581 Secondary hyperparathyroidism of renal origin: Secondary | ICD-10-CM | POA: Diagnosis not present

## 2019-04-03 DIAGNOSIS — D509 Iron deficiency anemia, unspecified: Secondary | ICD-10-CM | POA: Diagnosis not present

## 2019-04-05 DIAGNOSIS — Z992 Dependence on renal dialysis: Secondary | ICD-10-CM | POA: Diagnosis not present

## 2019-04-05 DIAGNOSIS — D509 Iron deficiency anemia, unspecified: Secondary | ICD-10-CM | POA: Diagnosis not present

## 2019-04-05 DIAGNOSIS — D631 Anemia in chronic kidney disease: Secondary | ICD-10-CM | POA: Diagnosis not present

## 2019-04-05 DIAGNOSIS — N186 End stage renal disease: Secondary | ICD-10-CM | POA: Diagnosis not present

## 2019-04-05 DIAGNOSIS — N2581 Secondary hyperparathyroidism of renal origin: Secondary | ICD-10-CM | POA: Diagnosis not present

## 2019-04-07 DIAGNOSIS — D509 Iron deficiency anemia, unspecified: Secondary | ICD-10-CM | POA: Diagnosis not present

## 2019-04-07 DIAGNOSIS — N186 End stage renal disease: Secondary | ICD-10-CM | POA: Diagnosis not present

## 2019-04-07 DIAGNOSIS — N2581 Secondary hyperparathyroidism of renal origin: Secondary | ICD-10-CM | POA: Diagnosis not present

## 2019-04-07 DIAGNOSIS — Z992 Dependence on renal dialysis: Secondary | ICD-10-CM | POA: Diagnosis not present

## 2019-04-07 DIAGNOSIS — D631 Anemia in chronic kidney disease: Secondary | ICD-10-CM | POA: Diagnosis not present

## 2019-04-10 DIAGNOSIS — N186 End stage renal disease: Secondary | ICD-10-CM | POA: Diagnosis not present

## 2019-04-10 DIAGNOSIS — D631 Anemia in chronic kidney disease: Secondary | ICD-10-CM | POA: Diagnosis not present

## 2019-04-10 DIAGNOSIS — D509 Iron deficiency anemia, unspecified: Secondary | ICD-10-CM | POA: Diagnosis not present

## 2019-04-10 DIAGNOSIS — N2581 Secondary hyperparathyroidism of renal origin: Secondary | ICD-10-CM | POA: Diagnosis not present

## 2019-04-10 DIAGNOSIS — Z992 Dependence on renal dialysis: Secondary | ICD-10-CM | POA: Diagnosis not present

## 2019-04-12 DIAGNOSIS — N2581 Secondary hyperparathyroidism of renal origin: Secondary | ICD-10-CM | POA: Diagnosis not present

## 2019-04-12 DIAGNOSIS — D509 Iron deficiency anemia, unspecified: Secondary | ICD-10-CM | POA: Diagnosis not present

## 2019-04-12 DIAGNOSIS — D631 Anemia in chronic kidney disease: Secondary | ICD-10-CM | POA: Diagnosis not present

## 2019-04-12 DIAGNOSIS — N186 End stage renal disease: Secondary | ICD-10-CM | POA: Diagnosis not present

## 2019-04-12 DIAGNOSIS — Z992 Dependence on renal dialysis: Secondary | ICD-10-CM | POA: Diagnosis not present

## 2019-04-14 DIAGNOSIS — Z992 Dependence on renal dialysis: Secondary | ICD-10-CM | POA: Diagnosis not present

## 2019-04-14 DIAGNOSIS — N186 End stage renal disease: Secondary | ICD-10-CM | POA: Diagnosis not present

## 2019-04-14 DIAGNOSIS — N2581 Secondary hyperparathyroidism of renal origin: Secondary | ICD-10-CM | POA: Diagnosis not present

## 2019-04-14 DIAGNOSIS — D631 Anemia in chronic kidney disease: Secondary | ICD-10-CM | POA: Diagnosis not present

## 2019-04-14 DIAGNOSIS — D509 Iron deficiency anemia, unspecified: Secondary | ICD-10-CM | POA: Diagnosis not present

## 2019-04-17 DIAGNOSIS — D509 Iron deficiency anemia, unspecified: Secondary | ICD-10-CM | POA: Diagnosis not present

## 2019-04-17 DIAGNOSIS — D631 Anemia in chronic kidney disease: Secondary | ICD-10-CM | POA: Diagnosis not present

## 2019-04-17 DIAGNOSIS — N186 End stage renal disease: Secondary | ICD-10-CM | POA: Diagnosis not present

## 2019-04-17 DIAGNOSIS — N2581 Secondary hyperparathyroidism of renal origin: Secondary | ICD-10-CM | POA: Diagnosis not present

## 2019-04-17 DIAGNOSIS — Z992 Dependence on renal dialysis: Secondary | ICD-10-CM | POA: Diagnosis not present

## 2019-04-19 DIAGNOSIS — Z992 Dependence on renal dialysis: Secondary | ICD-10-CM | POA: Diagnosis not present

## 2019-04-19 DIAGNOSIS — N186 End stage renal disease: Secondary | ICD-10-CM | POA: Diagnosis not present

## 2019-04-19 DIAGNOSIS — D631 Anemia in chronic kidney disease: Secondary | ICD-10-CM | POA: Diagnosis not present

## 2019-04-19 DIAGNOSIS — D509 Iron deficiency anemia, unspecified: Secondary | ICD-10-CM | POA: Diagnosis not present

## 2019-04-19 DIAGNOSIS — N2581 Secondary hyperparathyroidism of renal origin: Secondary | ICD-10-CM | POA: Diagnosis not present

## 2019-04-20 DIAGNOSIS — N186 End stage renal disease: Secondary | ICD-10-CM | POA: Diagnosis not present

## 2019-04-20 DIAGNOSIS — Z992 Dependence on renal dialysis: Secondary | ICD-10-CM | POA: Diagnosis not present

## 2019-04-21 DIAGNOSIS — N2581 Secondary hyperparathyroidism of renal origin: Secondary | ICD-10-CM | POA: Diagnosis not present

## 2019-04-21 DIAGNOSIS — Z992 Dependence on renal dialysis: Secondary | ICD-10-CM | POA: Diagnosis not present

## 2019-04-21 DIAGNOSIS — D509 Iron deficiency anemia, unspecified: Secondary | ICD-10-CM | POA: Diagnosis not present

## 2019-04-21 DIAGNOSIS — D631 Anemia in chronic kidney disease: Secondary | ICD-10-CM | POA: Diagnosis not present

## 2019-04-21 DIAGNOSIS — N186 End stage renal disease: Secondary | ICD-10-CM | POA: Diagnosis not present

## 2019-04-24 DIAGNOSIS — D509 Iron deficiency anemia, unspecified: Secondary | ICD-10-CM | POA: Diagnosis not present

## 2019-04-24 DIAGNOSIS — Z992 Dependence on renal dialysis: Secondary | ICD-10-CM | POA: Diagnosis not present

## 2019-04-24 DIAGNOSIS — N186 End stage renal disease: Secondary | ICD-10-CM | POA: Diagnosis not present

## 2019-04-24 DIAGNOSIS — D631 Anemia in chronic kidney disease: Secondary | ICD-10-CM | POA: Diagnosis not present

## 2019-04-24 DIAGNOSIS — N2581 Secondary hyperparathyroidism of renal origin: Secondary | ICD-10-CM | POA: Diagnosis not present

## 2019-04-26 DIAGNOSIS — N186 End stage renal disease: Secondary | ICD-10-CM | POA: Diagnosis not present

## 2019-04-26 DIAGNOSIS — Z992 Dependence on renal dialysis: Secondary | ICD-10-CM | POA: Diagnosis not present

## 2019-04-26 DIAGNOSIS — D509 Iron deficiency anemia, unspecified: Secondary | ICD-10-CM | POA: Diagnosis not present

## 2019-04-26 DIAGNOSIS — N2581 Secondary hyperparathyroidism of renal origin: Secondary | ICD-10-CM | POA: Diagnosis not present

## 2019-04-26 DIAGNOSIS — D631 Anemia in chronic kidney disease: Secondary | ICD-10-CM | POA: Diagnosis not present

## 2019-04-28 DIAGNOSIS — N186 End stage renal disease: Secondary | ICD-10-CM | POA: Diagnosis not present

## 2019-04-28 DIAGNOSIS — D509 Iron deficiency anemia, unspecified: Secondary | ICD-10-CM | POA: Diagnosis not present

## 2019-04-28 DIAGNOSIS — D631 Anemia in chronic kidney disease: Secondary | ICD-10-CM | POA: Diagnosis not present

## 2019-04-28 DIAGNOSIS — N2581 Secondary hyperparathyroidism of renal origin: Secondary | ICD-10-CM | POA: Diagnosis not present

## 2019-04-28 DIAGNOSIS — Z992 Dependence on renal dialysis: Secondary | ICD-10-CM | POA: Diagnosis not present

## 2019-05-01 DIAGNOSIS — N2581 Secondary hyperparathyroidism of renal origin: Secondary | ICD-10-CM | POA: Diagnosis not present

## 2019-05-01 DIAGNOSIS — N186 End stage renal disease: Secondary | ICD-10-CM | POA: Diagnosis not present

## 2019-05-01 DIAGNOSIS — Z992 Dependence on renal dialysis: Secondary | ICD-10-CM | POA: Diagnosis not present

## 2019-05-01 DIAGNOSIS — D631 Anemia in chronic kidney disease: Secondary | ICD-10-CM | POA: Diagnosis not present

## 2019-05-01 DIAGNOSIS — D509 Iron deficiency anemia, unspecified: Secondary | ICD-10-CM | POA: Diagnosis not present

## 2019-05-03 DIAGNOSIS — N2581 Secondary hyperparathyroidism of renal origin: Secondary | ICD-10-CM | POA: Diagnosis not present

## 2019-05-03 DIAGNOSIS — D631 Anemia in chronic kidney disease: Secondary | ICD-10-CM | POA: Diagnosis not present

## 2019-05-03 DIAGNOSIS — N186 End stage renal disease: Secondary | ICD-10-CM | POA: Diagnosis not present

## 2019-05-03 DIAGNOSIS — D509 Iron deficiency anemia, unspecified: Secondary | ICD-10-CM | POA: Diagnosis not present

## 2019-05-03 DIAGNOSIS — Z992 Dependence on renal dialysis: Secondary | ICD-10-CM | POA: Diagnosis not present

## 2019-05-05 DIAGNOSIS — D631 Anemia in chronic kidney disease: Secondary | ICD-10-CM | POA: Diagnosis not present

## 2019-05-05 DIAGNOSIS — N2581 Secondary hyperparathyroidism of renal origin: Secondary | ICD-10-CM | POA: Diagnosis not present

## 2019-05-05 DIAGNOSIS — Z992 Dependence on renal dialysis: Secondary | ICD-10-CM | POA: Diagnosis not present

## 2019-05-05 DIAGNOSIS — D509 Iron deficiency anemia, unspecified: Secondary | ICD-10-CM | POA: Diagnosis not present

## 2019-05-05 DIAGNOSIS — N186 End stage renal disease: Secondary | ICD-10-CM | POA: Diagnosis not present

## 2019-05-08 DIAGNOSIS — D631 Anemia in chronic kidney disease: Secondary | ICD-10-CM | POA: Diagnosis not present

## 2019-05-08 DIAGNOSIS — Z992 Dependence on renal dialysis: Secondary | ICD-10-CM | POA: Diagnosis not present

## 2019-05-08 DIAGNOSIS — N2581 Secondary hyperparathyroidism of renal origin: Secondary | ICD-10-CM | POA: Diagnosis not present

## 2019-05-08 DIAGNOSIS — D509 Iron deficiency anemia, unspecified: Secondary | ICD-10-CM | POA: Diagnosis not present

## 2019-05-08 DIAGNOSIS — N186 End stage renal disease: Secondary | ICD-10-CM | POA: Diagnosis not present

## 2019-05-10 DIAGNOSIS — Z992 Dependence on renal dialysis: Secondary | ICD-10-CM | POA: Diagnosis not present

## 2019-05-10 DIAGNOSIS — N2581 Secondary hyperparathyroidism of renal origin: Secondary | ICD-10-CM | POA: Diagnosis not present

## 2019-05-10 DIAGNOSIS — D631 Anemia in chronic kidney disease: Secondary | ICD-10-CM | POA: Diagnosis not present

## 2019-05-10 DIAGNOSIS — N186 End stage renal disease: Secondary | ICD-10-CM | POA: Diagnosis not present

## 2019-05-10 DIAGNOSIS — D509 Iron deficiency anemia, unspecified: Secondary | ICD-10-CM | POA: Diagnosis not present

## 2019-05-11 ENCOUNTER — Other Ambulatory Visit: Payer: Self-pay

## 2019-05-12 DIAGNOSIS — D509 Iron deficiency anemia, unspecified: Secondary | ICD-10-CM | POA: Diagnosis not present

## 2019-05-12 DIAGNOSIS — Z992 Dependence on renal dialysis: Secondary | ICD-10-CM | POA: Diagnosis not present

## 2019-05-12 DIAGNOSIS — D631 Anemia in chronic kidney disease: Secondary | ICD-10-CM | POA: Diagnosis not present

## 2019-05-12 DIAGNOSIS — N186 End stage renal disease: Secondary | ICD-10-CM | POA: Diagnosis not present

## 2019-05-12 DIAGNOSIS — N2581 Secondary hyperparathyroidism of renal origin: Secondary | ICD-10-CM | POA: Diagnosis not present

## 2019-05-15 DIAGNOSIS — Z992 Dependence on renal dialysis: Secondary | ICD-10-CM | POA: Diagnosis not present

## 2019-05-15 DIAGNOSIS — N2581 Secondary hyperparathyroidism of renal origin: Secondary | ICD-10-CM | POA: Diagnosis not present

## 2019-05-15 DIAGNOSIS — N186 End stage renal disease: Secondary | ICD-10-CM | POA: Diagnosis not present

## 2019-05-15 DIAGNOSIS — D509 Iron deficiency anemia, unspecified: Secondary | ICD-10-CM | POA: Diagnosis not present

## 2019-05-15 DIAGNOSIS — D631 Anemia in chronic kidney disease: Secondary | ICD-10-CM | POA: Diagnosis not present

## 2019-05-17 DIAGNOSIS — N2581 Secondary hyperparathyroidism of renal origin: Secondary | ICD-10-CM | POA: Diagnosis not present

## 2019-05-17 DIAGNOSIS — D509 Iron deficiency anemia, unspecified: Secondary | ICD-10-CM | POA: Diagnosis not present

## 2019-05-17 DIAGNOSIS — Z992 Dependence on renal dialysis: Secondary | ICD-10-CM | POA: Diagnosis not present

## 2019-05-17 DIAGNOSIS — D631 Anemia in chronic kidney disease: Secondary | ICD-10-CM | POA: Diagnosis not present

## 2019-05-17 DIAGNOSIS — E119 Type 2 diabetes mellitus without complications: Secondary | ICD-10-CM | POA: Diagnosis not present

## 2019-05-17 DIAGNOSIS — N186 End stage renal disease: Secondary | ICD-10-CM | POA: Diagnosis not present

## 2019-05-17 DIAGNOSIS — Z794 Long term (current) use of insulin: Secondary | ICD-10-CM | POA: Diagnosis not present

## 2019-05-19 DIAGNOSIS — Z992 Dependence on renal dialysis: Secondary | ICD-10-CM | POA: Diagnosis not present

## 2019-05-19 DIAGNOSIS — D631 Anemia in chronic kidney disease: Secondary | ICD-10-CM | POA: Diagnosis not present

## 2019-05-19 DIAGNOSIS — D509 Iron deficiency anemia, unspecified: Secondary | ICD-10-CM | POA: Diagnosis not present

## 2019-05-19 DIAGNOSIS — N2581 Secondary hyperparathyroidism of renal origin: Secondary | ICD-10-CM | POA: Diagnosis not present

## 2019-05-19 DIAGNOSIS — N186 End stage renal disease: Secondary | ICD-10-CM | POA: Diagnosis not present

## 2019-05-21 DIAGNOSIS — Z992 Dependence on renal dialysis: Secondary | ICD-10-CM | POA: Diagnosis not present

## 2019-05-21 DIAGNOSIS — N186 End stage renal disease: Secondary | ICD-10-CM | POA: Diagnosis not present

## 2019-05-22 DIAGNOSIS — D631 Anemia in chronic kidney disease: Secondary | ICD-10-CM | POA: Diagnosis not present

## 2019-05-22 DIAGNOSIS — N2581 Secondary hyperparathyroidism of renal origin: Secondary | ICD-10-CM | POA: Diagnosis not present

## 2019-05-22 DIAGNOSIS — D509 Iron deficiency anemia, unspecified: Secondary | ICD-10-CM | POA: Diagnosis not present

## 2019-05-22 DIAGNOSIS — Z992 Dependence on renal dialysis: Secondary | ICD-10-CM | POA: Diagnosis not present

## 2019-05-22 DIAGNOSIS — N186 End stage renal disease: Secondary | ICD-10-CM | POA: Diagnosis not present

## 2019-05-24 DIAGNOSIS — D509 Iron deficiency anemia, unspecified: Secondary | ICD-10-CM | POA: Diagnosis not present

## 2019-05-24 DIAGNOSIS — N2581 Secondary hyperparathyroidism of renal origin: Secondary | ICD-10-CM | POA: Diagnosis not present

## 2019-05-24 DIAGNOSIS — N186 End stage renal disease: Secondary | ICD-10-CM | POA: Diagnosis not present

## 2019-05-24 DIAGNOSIS — D631 Anemia in chronic kidney disease: Secondary | ICD-10-CM | POA: Diagnosis not present

## 2019-05-24 DIAGNOSIS — Z992 Dependence on renal dialysis: Secondary | ICD-10-CM | POA: Diagnosis not present

## 2019-05-26 DIAGNOSIS — N186 End stage renal disease: Secondary | ICD-10-CM | POA: Diagnosis not present

## 2019-05-26 DIAGNOSIS — D631 Anemia in chronic kidney disease: Secondary | ICD-10-CM | POA: Diagnosis not present

## 2019-05-26 DIAGNOSIS — D509 Iron deficiency anemia, unspecified: Secondary | ICD-10-CM | POA: Diagnosis not present

## 2019-05-26 DIAGNOSIS — N2581 Secondary hyperparathyroidism of renal origin: Secondary | ICD-10-CM | POA: Diagnosis not present

## 2019-05-26 DIAGNOSIS — Z992 Dependence on renal dialysis: Secondary | ICD-10-CM | POA: Diagnosis not present

## 2019-05-29 DIAGNOSIS — Z992 Dependence on renal dialysis: Secondary | ICD-10-CM | POA: Diagnosis not present

## 2019-05-29 DIAGNOSIS — N2581 Secondary hyperparathyroidism of renal origin: Secondary | ICD-10-CM | POA: Diagnosis not present

## 2019-05-29 DIAGNOSIS — D509 Iron deficiency anemia, unspecified: Secondary | ICD-10-CM | POA: Diagnosis not present

## 2019-05-29 DIAGNOSIS — D631 Anemia in chronic kidney disease: Secondary | ICD-10-CM | POA: Diagnosis not present

## 2019-05-29 DIAGNOSIS — N186 End stage renal disease: Secondary | ICD-10-CM | POA: Diagnosis not present

## 2019-05-31 DIAGNOSIS — D509 Iron deficiency anemia, unspecified: Secondary | ICD-10-CM | POA: Diagnosis not present

## 2019-05-31 DIAGNOSIS — D631 Anemia in chronic kidney disease: Secondary | ICD-10-CM | POA: Diagnosis not present

## 2019-05-31 DIAGNOSIS — N2581 Secondary hyperparathyroidism of renal origin: Secondary | ICD-10-CM | POA: Diagnosis not present

## 2019-05-31 DIAGNOSIS — N186 End stage renal disease: Secondary | ICD-10-CM | POA: Diagnosis not present

## 2019-05-31 DIAGNOSIS — Z992 Dependence on renal dialysis: Secondary | ICD-10-CM | POA: Diagnosis not present

## 2019-06-02 DIAGNOSIS — N2581 Secondary hyperparathyroidism of renal origin: Secondary | ICD-10-CM | POA: Diagnosis not present

## 2019-06-02 DIAGNOSIS — N186 End stage renal disease: Secondary | ICD-10-CM | POA: Diagnosis not present

## 2019-06-02 DIAGNOSIS — D631 Anemia in chronic kidney disease: Secondary | ICD-10-CM | POA: Diagnosis not present

## 2019-06-02 DIAGNOSIS — D509 Iron deficiency anemia, unspecified: Secondary | ICD-10-CM | POA: Diagnosis not present

## 2019-06-02 DIAGNOSIS — Z992 Dependence on renal dialysis: Secondary | ICD-10-CM | POA: Diagnosis not present

## 2019-06-05 DIAGNOSIS — D509 Iron deficiency anemia, unspecified: Secondary | ICD-10-CM | POA: Diagnosis not present

## 2019-06-05 DIAGNOSIS — D631 Anemia in chronic kidney disease: Secondary | ICD-10-CM | POA: Diagnosis not present

## 2019-06-05 DIAGNOSIS — Z992 Dependence on renal dialysis: Secondary | ICD-10-CM | POA: Diagnosis not present

## 2019-06-05 DIAGNOSIS — N2581 Secondary hyperparathyroidism of renal origin: Secondary | ICD-10-CM | POA: Diagnosis not present

## 2019-06-05 DIAGNOSIS — N186 End stage renal disease: Secondary | ICD-10-CM | POA: Diagnosis not present

## 2019-06-07 DIAGNOSIS — D509 Iron deficiency anemia, unspecified: Secondary | ICD-10-CM | POA: Diagnosis not present

## 2019-06-07 DIAGNOSIS — N2581 Secondary hyperparathyroidism of renal origin: Secondary | ICD-10-CM | POA: Diagnosis not present

## 2019-06-07 DIAGNOSIS — D631 Anemia in chronic kidney disease: Secondary | ICD-10-CM | POA: Diagnosis not present

## 2019-06-07 DIAGNOSIS — Z992 Dependence on renal dialysis: Secondary | ICD-10-CM | POA: Diagnosis not present

## 2019-06-07 DIAGNOSIS — N186 End stage renal disease: Secondary | ICD-10-CM | POA: Diagnosis not present

## 2019-06-09 DIAGNOSIS — D631 Anemia in chronic kidney disease: Secondary | ICD-10-CM | POA: Diagnosis not present

## 2019-06-09 DIAGNOSIS — D509 Iron deficiency anemia, unspecified: Secondary | ICD-10-CM | POA: Diagnosis not present

## 2019-06-09 DIAGNOSIS — N186 End stage renal disease: Secondary | ICD-10-CM | POA: Diagnosis not present

## 2019-06-09 DIAGNOSIS — N2581 Secondary hyperparathyroidism of renal origin: Secondary | ICD-10-CM | POA: Diagnosis not present

## 2019-06-09 DIAGNOSIS — Z992 Dependence on renal dialysis: Secondary | ICD-10-CM | POA: Diagnosis not present

## 2019-06-12 DIAGNOSIS — N186 End stage renal disease: Secondary | ICD-10-CM | POA: Diagnosis not present

## 2019-06-12 DIAGNOSIS — Z992 Dependence on renal dialysis: Secondary | ICD-10-CM | POA: Diagnosis not present

## 2019-06-12 DIAGNOSIS — D631 Anemia in chronic kidney disease: Secondary | ICD-10-CM | POA: Diagnosis not present

## 2019-06-12 DIAGNOSIS — N2581 Secondary hyperparathyroidism of renal origin: Secondary | ICD-10-CM | POA: Diagnosis not present

## 2019-06-12 DIAGNOSIS — D509 Iron deficiency anemia, unspecified: Secondary | ICD-10-CM | POA: Diagnosis not present

## 2019-06-14 DIAGNOSIS — N186 End stage renal disease: Secondary | ICD-10-CM | POA: Diagnosis not present

## 2019-06-14 DIAGNOSIS — Z992 Dependence on renal dialysis: Secondary | ICD-10-CM | POA: Diagnosis not present

## 2019-06-14 DIAGNOSIS — D509 Iron deficiency anemia, unspecified: Secondary | ICD-10-CM | POA: Diagnosis not present

## 2019-06-14 DIAGNOSIS — N2581 Secondary hyperparathyroidism of renal origin: Secondary | ICD-10-CM | POA: Diagnosis not present

## 2019-06-14 DIAGNOSIS — D631 Anemia in chronic kidney disease: Secondary | ICD-10-CM | POA: Diagnosis not present

## 2019-06-16 DIAGNOSIS — Z992 Dependence on renal dialysis: Secondary | ICD-10-CM | POA: Diagnosis not present

## 2019-06-16 DIAGNOSIS — D509 Iron deficiency anemia, unspecified: Secondary | ICD-10-CM | POA: Diagnosis not present

## 2019-06-16 DIAGNOSIS — N186 End stage renal disease: Secondary | ICD-10-CM | POA: Diagnosis not present

## 2019-06-16 DIAGNOSIS — D631 Anemia in chronic kidney disease: Secondary | ICD-10-CM | POA: Diagnosis not present

## 2019-06-16 DIAGNOSIS — N2581 Secondary hyperparathyroidism of renal origin: Secondary | ICD-10-CM | POA: Diagnosis not present

## 2019-06-19 DIAGNOSIS — N186 End stage renal disease: Secondary | ICD-10-CM | POA: Diagnosis not present

## 2019-06-19 DIAGNOSIS — N2581 Secondary hyperparathyroidism of renal origin: Secondary | ICD-10-CM | POA: Diagnosis not present

## 2019-06-19 DIAGNOSIS — D509 Iron deficiency anemia, unspecified: Secondary | ICD-10-CM | POA: Diagnosis not present

## 2019-06-19 DIAGNOSIS — Z992 Dependence on renal dialysis: Secondary | ICD-10-CM | POA: Diagnosis not present

## 2019-06-19 DIAGNOSIS — D631 Anemia in chronic kidney disease: Secondary | ICD-10-CM | POA: Diagnosis not present

## 2019-06-20 DIAGNOSIS — Z992 Dependence on renal dialysis: Secondary | ICD-10-CM | POA: Diagnosis not present

## 2019-06-20 DIAGNOSIS — N186 End stage renal disease: Secondary | ICD-10-CM | POA: Diagnosis not present

## 2019-06-21 DIAGNOSIS — N2581 Secondary hyperparathyroidism of renal origin: Secondary | ICD-10-CM | POA: Diagnosis not present

## 2019-06-21 DIAGNOSIS — D509 Iron deficiency anemia, unspecified: Secondary | ICD-10-CM | POA: Diagnosis not present

## 2019-06-21 DIAGNOSIS — N186 End stage renal disease: Secondary | ICD-10-CM | POA: Diagnosis not present

## 2019-06-21 DIAGNOSIS — Z992 Dependence on renal dialysis: Secondary | ICD-10-CM | POA: Diagnosis not present

## 2019-06-21 DIAGNOSIS — D631 Anemia in chronic kidney disease: Secondary | ICD-10-CM | POA: Diagnosis not present

## 2019-06-23 DIAGNOSIS — D631 Anemia in chronic kidney disease: Secondary | ICD-10-CM | POA: Diagnosis not present

## 2019-06-23 DIAGNOSIS — N2581 Secondary hyperparathyroidism of renal origin: Secondary | ICD-10-CM | POA: Diagnosis not present

## 2019-06-23 DIAGNOSIS — Z992 Dependence on renal dialysis: Secondary | ICD-10-CM | POA: Diagnosis not present

## 2019-06-23 DIAGNOSIS — D509 Iron deficiency anemia, unspecified: Secondary | ICD-10-CM | POA: Diagnosis not present

## 2019-06-23 DIAGNOSIS — N186 End stage renal disease: Secondary | ICD-10-CM | POA: Diagnosis not present

## 2019-06-26 DIAGNOSIS — D509 Iron deficiency anemia, unspecified: Secondary | ICD-10-CM | POA: Diagnosis not present

## 2019-06-26 DIAGNOSIS — N2581 Secondary hyperparathyroidism of renal origin: Secondary | ICD-10-CM | POA: Diagnosis not present

## 2019-06-26 DIAGNOSIS — D631 Anemia in chronic kidney disease: Secondary | ICD-10-CM | POA: Diagnosis not present

## 2019-06-26 DIAGNOSIS — N186 End stage renal disease: Secondary | ICD-10-CM | POA: Diagnosis not present

## 2019-06-26 DIAGNOSIS — Z992 Dependence on renal dialysis: Secondary | ICD-10-CM | POA: Diagnosis not present

## 2019-06-28 DIAGNOSIS — N186 End stage renal disease: Secondary | ICD-10-CM | POA: Diagnosis not present

## 2019-06-28 DIAGNOSIS — Z992 Dependence on renal dialysis: Secondary | ICD-10-CM | POA: Diagnosis not present

## 2019-06-28 DIAGNOSIS — D631 Anemia in chronic kidney disease: Secondary | ICD-10-CM | POA: Diagnosis not present

## 2019-06-28 DIAGNOSIS — N2581 Secondary hyperparathyroidism of renal origin: Secondary | ICD-10-CM | POA: Diagnosis not present

## 2019-06-28 DIAGNOSIS — D509 Iron deficiency anemia, unspecified: Secondary | ICD-10-CM | POA: Diagnosis not present

## 2019-06-30 DIAGNOSIS — D631 Anemia in chronic kidney disease: Secondary | ICD-10-CM | POA: Diagnosis not present

## 2019-06-30 DIAGNOSIS — N2581 Secondary hyperparathyroidism of renal origin: Secondary | ICD-10-CM | POA: Diagnosis not present

## 2019-06-30 DIAGNOSIS — Z992 Dependence on renal dialysis: Secondary | ICD-10-CM | POA: Diagnosis not present

## 2019-06-30 DIAGNOSIS — N186 End stage renal disease: Secondary | ICD-10-CM | POA: Diagnosis not present

## 2019-06-30 DIAGNOSIS — D509 Iron deficiency anemia, unspecified: Secondary | ICD-10-CM | POA: Diagnosis not present

## 2019-07-03 DIAGNOSIS — Z992 Dependence on renal dialysis: Secondary | ICD-10-CM | POA: Diagnosis not present

## 2019-07-03 DIAGNOSIS — N2581 Secondary hyperparathyroidism of renal origin: Secondary | ICD-10-CM | POA: Diagnosis not present

## 2019-07-03 DIAGNOSIS — N186 End stage renal disease: Secondary | ICD-10-CM | POA: Diagnosis not present

## 2019-07-03 DIAGNOSIS — D509 Iron deficiency anemia, unspecified: Secondary | ICD-10-CM | POA: Diagnosis not present

## 2019-07-03 DIAGNOSIS — D631 Anemia in chronic kidney disease: Secondary | ICD-10-CM | POA: Diagnosis not present

## 2019-07-05 DIAGNOSIS — D509 Iron deficiency anemia, unspecified: Secondary | ICD-10-CM | POA: Diagnosis not present

## 2019-07-05 DIAGNOSIS — N186 End stage renal disease: Secondary | ICD-10-CM | POA: Diagnosis not present

## 2019-07-05 DIAGNOSIS — N2581 Secondary hyperparathyroidism of renal origin: Secondary | ICD-10-CM | POA: Diagnosis not present

## 2019-07-05 DIAGNOSIS — D631 Anemia in chronic kidney disease: Secondary | ICD-10-CM | POA: Diagnosis not present

## 2019-07-05 DIAGNOSIS — Z992 Dependence on renal dialysis: Secondary | ICD-10-CM | POA: Diagnosis not present

## 2019-07-07 DIAGNOSIS — N186 End stage renal disease: Secondary | ICD-10-CM | POA: Diagnosis not present

## 2019-07-07 DIAGNOSIS — D631 Anemia in chronic kidney disease: Secondary | ICD-10-CM | POA: Diagnosis not present

## 2019-07-07 DIAGNOSIS — N2581 Secondary hyperparathyroidism of renal origin: Secondary | ICD-10-CM | POA: Diagnosis not present

## 2019-07-07 DIAGNOSIS — D509 Iron deficiency anemia, unspecified: Secondary | ICD-10-CM | POA: Diagnosis not present

## 2019-07-07 DIAGNOSIS — Z992 Dependence on renal dialysis: Secondary | ICD-10-CM | POA: Diagnosis not present

## 2019-07-10 DIAGNOSIS — D509 Iron deficiency anemia, unspecified: Secondary | ICD-10-CM | POA: Diagnosis not present

## 2019-07-10 DIAGNOSIS — D631 Anemia in chronic kidney disease: Secondary | ICD-10-CM | POA: Diagnosis not present

## 2019-07-10 DIAGNOSIS — Z992 Dependence on renal dialysis: Secondary | ICD-10-CM | POA: Diagnosis not present

## 2019-07-10 DIAGNOSIS — N2581 Secondary hyperparathyroidism of renal origin: Secondary | ICD-10-CM | POA: Diagnosis not present

## 2019-07-10 DIAGNOSIS — N186 End stage renal disease: Secondary | ICD-10-CM | POA: Diagnosis not present

## 2019-07-12 DIAGNOSIS — D509 Iron deficiency anemia, unspecified: Secondary | ICD-10-CM | POA: Diagnosis not present

## 2019-07-12 DIAGNOSIS — N186 End stage renal disease: Secondary | ICD-10-CM | POA: Diagnosis not present

## 2019-07-12 DIAGNOSIS — D631 Anemia in chronic kidney disease: Secondary | ICD-10-CM | POA: Diagnosis not present

## 2019-07-12 DIAGNOSIS — Z992 Dependence on renal dialysis: Secondary | ICD-10-CM | POA: Diagnosis not present

## 2019-07-12 DIAGNOSIS — N2581 Secondary hyperparathyroidism of renal origin: Secondary | ICD-10-CM | POA: Diagnosis not present

## 2019-07-14 DIAGNOSIS — N2581 Secondary hyperparathyroidism of renal origin: Secondary | ICD-10-CM | POA: Diagnosis not present

## 2019-07-14 DIAGNOSIS — D631 Anemia in chronic kidney disease: Secondary | ICD-10-CM | POA: Diagnosis not present

## 2019-07-14 DIAGNOSIS — D509 Iron deficiency anemia, unspecified: Secondary | ICD-10-CM | POA: Diagnosis not present

## 2019-07-14 DIAGNOSIS — N186 End stage renal disease: Secondary | ICD-10-CM | POA: Diagnosis not present

## 2019-07-14 DIAGNOSIS — Z992 Dependence on renal dialysis: Secondary | ICD-10-CM | POA: Diagnosis not present

## 2019-07-17 DIAGNOSIS — D631 Anemia in chronic kidney disease: Secondary | ICD-10-CM | POA: Diagnosis not present

## 2019-07-17 DIAGNOSIS — D509 Iron deficiency anemia, unspecified: Secondary | ICD-10-CM | POA: Diagnosis not present

## 2019-07-17 DIAGNOSIS — Z992 Dependence on renal dialysis: Secondary | ICD-10-CM | POA: Diagnosis not present

## 2019-07-17 DIAGNOSIS — N2581 Secondary hyperparathyroidism of renal origin: Secondary | ICD-10-CM | POA: Diagnosis not present

## 2019-07-17 DIAGNOSIS — N186 End stage renal disease: Secondary | ICD-10-CM | POA: Diagnosis not present

## 2019-07-19 DIAGNOSIS — D509 Iron deficiency anemia, unspecified: Secondary | ICD-10-CM | POA: Diagnosis not present

## 2019-07-19 DIAGNOSIS — Z992 Dependence on renal dialysis: Secondary | ICD-10-CM | POA: Diagnosis not present

## 2019-07-19 DIAGNOSIS — N186 End stage renal disease: Secondary | ICD-10-CM | POA: Diagnosis not present

## 2019-07-19 DIAGNOSIS — D631 Anemia in chronic kidney disease: Secondary | ICD-10-CM | POA: Diagnosis not present

## 2019-07-19 DIAGNOSIS — N2581 Secondary hyperparathyroidism of renal origin: Secondary | ICD-10-CM | POA: Diagnosis not present

## 2019-07-21 DIAGNOSIS — N186 End stage renal disease: Secondary | ICD-10-CM | POA: Diagnosis not present

## 2019-07-21 DIAGNOSIS — Z992 Dependence on renal dialysis: Secondary | ICD-10-CM | POA: Diagnosis not present

## 2019-07-21 DIAGNOSIS — N2581 Secondary hyperparathyroidism of renal origin: Secondary | ICD-10-CM | POA: Diagnosis not present

## 2019-07-21 DIAGNOSIS — D509 Iron deficiency anemia, unspecified: Secondary | ICD-10-CM | POA: Diagnosis not present

## 2019-07-21 DIAGNOSIS — D631 Anemia in chronic kidney disease: Secondary | ICD-10-CM | POA: Diagnosis not present

## 2019-07-24 DIAGNOSIS — D509 Iron deficiency anemia, unspecified: Secondary | ICD-10-CM | POA: Diagnosis not present

## 2019-07-24 DIAGNOSIS — Z992 Dependence on renal dialysis: Secondary | ICD-10-CM | POA: Diagnosis not present

## 2019-07-24 DIAGNOSIS — N186 End stage renal disease: Secondary | ICD-10-CM | POA: Diagnosis not present

## 2019-07-24 DIAGNOSIS — N2581 Secondary hyperparathyroidism of renal origin: Secondary | ICD-10-CM | POA: Diagnosis not present

## 2019-07-24 DIAGNOSIS — D631 Anemia in chronic kidney disease: Secondary | ICD-10-CM | POA: Diagnosis not present

## 2019-07-26 DIAGNOSIS — D631 Anemia in chronic kidney disease: Secondary | ICD-10-CM | POA: Diagnosis not present

## 2019-07-26 DIAGNOSIS — N186 End stage renal disease: Secondary | ICD-10-CM | POA: Diagnosis not present

## 2019-07-26 DIAGNOSIS — N2581 Secondary hyperparathyroidism of renal origin: Secondary | ICD-10-CM | POA: Diagnosis not present

## 2019-07-26 DIAGNOSIS — D509 Iron deficiency anemia, unspecified: Secondary | ICD-10-CM | POA: Diagnosis not present

## 2019-07-26 DIAGNOSIS — Z992 Dependence on renal dialysis: Secondary | ICD-10-CM | POA: Diagnosis not present

## 2019-07-28 DIAGNOSIS — N186 End stage renal disease: Secondary | ICD-10-CM | POA: Diagnosis not present

## 2019-07-28 DIAGNOSIS — D509 Iron deficiency anemia, unspecified: Secondary | ICD-10-CM | POA: Diagnosis not present

## 2019-07-28 DIAGNOSIS — D631 Anemia in chronic kidney disease: Secondary | ICD-10-CM | POA: Diagnosis not present

## 2019-07-28 DIAGNOSIS — N2581 Secondary hyperparathyroidism of renal origin: Secondary | ICD-10-CM | POA: Diagnosis not present

## 2019-07-28 DIAGNOSIS — Z992 Dependence on renal dialysis: Secondary | ICD-10-CM | POA: Diagnosis not present

## 2019-07-31 DIAGNOSIS — D509 Iron deficiency anemia, unspecified: Secondary | ICD-10-CM | POA: Diagnosis not present

## 2019-07-31 DIAGNOSIS — N186 End stage renal disease: Secondary | ICD-10-CM | POA: Diagnosis not present

## 2019-07-31 DIAGNOSIS — Z992 Dependence on renal dialysis: Secondary | ICD-10-CM | POA: Diagnosis not present

## 2019-07-31 DIAGNOSIS — D631 Anemia in chronic kidney disease: Secondary | ICD-10-CM | POA: Diagnosis not present

## 2019-07-31 DIAGNOSIS — N2581 Secondary hyperparathyroidism of renal origin: Secondary | ICD-10-CM | POA: Diagnosis not present

## 2019-08-02 DIAGNOSIS — N186 End stage renal disease: Secondary | ICD-10-CM | POA: Diagnosis not present

## 2019-08-02 DIAGNOSIS — D631 Anemia in chronic kidney disease: Secondary | ICD-10-CM | POA: Diagnosis not present

## 2019-08-02 DIAGNOSIS — D509 Iron deficiency anemia, unspecified: Secondary | ICD-10-CM | POA: Diagnosis not present

## 2019-08-02 DIAGNOSIS — Z992 Dependence on renal dialysis: Secondary | ICD-10-CM | POA: Diagnosis not present

## 2019-08-02 DIAGNOSIS — N2581 Secondary hyperparathyroidism of renal origin: Secondary | ICD-10-CM | POA: Diagnosis not present

## 2019-08-03 DIAGNOSIS — I771 Stricture of artery: Secondary | ICD-10-CM | POA: Diagnosis not present

## 2019-08-03 DIAGNOSIS — I5022 Chronic systolic (congestive) heart failure: Secondary | ICD-10-CM | POA: Diagnosis not present

## 2019-08-03 DIAGNOSIS — I120 Hypertensive chronic kidney disease with stage 5 chronic kidney disease or end stage renal disease: Secondary | ICD-10-CM | POA: Diagnosis not present

## 2019-08-03 DIAGNOSIS — Z794 Long term (current) use of insulin: Secondary | ICD-10-CM | POA: Diagnosis not present

## 2019-08-03 DIAGNOSIS — I2510 Atherosclerotic heart disease of native coronary artery without angina pectoris: Secondary | ICD-10-CM | POA: Diagnosis not present

## 2019-08-03 DIAGNOSIS — Z87891 Personal history of nicotine dependence: Secondary | ICD-10-CM | POA: Diagnosis not present

## 2019-08-03 DIAGNOSIS — E1122 Type 2 diabetes mellitus with diabetic chronic kidney disease: Secondary | ICD-10-CM | POA: Diagnosis not present

## 2019-08-03 DIAGNOSIS — E11319 Type 2 diabetes mellitus with unspecified diabetic retinopathy without macular edema: Secondary | ICD-10-CM | POA: Diagnosis not present

## 2019-08-03 DIAGNOSIS — N186 End stage renal disease: Secondary | ICD-10-CM | POA: Diagnosis not present

## 2019-08-03 DIAGNOSIS — I255 Ischemic cardiomyopathy: Secondary | ICD-10-CM | POA: Diagnosis not present

## 2019-08-03 DIAGNOSIS — E1165 Type 2 diabetes mellitus with hyperglycemia: Secondary | ICD-10-CM | POA: Diagnosis not present

## 2019-08-04 DIAGNOSIS — Z992 Dependence on renal dialysis: Secondary | ICD-10-CM | POA: Diagnosis not present

## 2019-08-04 DIAGNOSIS — N2581 Secondary hyperparathyroidism of renal origin: Secondary | ICD-10-CM | POA: Diagnosis not present

## 2019-08-04 DIAGNOSIS — D631 Anemia in chronic kidney disease: Secondary | ICD-10-CM | POA: Diagnosis not present

## 2019-08-04 DIAGNOSIS — N186 End stage renal disease: Secondary | ICD-10-CM | POA: Diagnosis not present

## 2019-08-04 DIAGNOSIS — D509 Iron deficiency anemia, unspecified: Secondary | ICD-10-CM | POA: Diagnosis not present

## 2019-08-07 DIAGNOSIS — N2581 Secondary hyperparathyroidism of renal origin: Secondary | ICD-10-CM | POA: Diagnosis not present

## 2019-08-07 DIAGNOSIS — D631 Anemia in chronic kidney disease: Secondary | ICD-10-CM | POA: Diagnosis not present

## 2019-08-07 DIAGNOSIS — D509 Iron deficiency anemia, unspecified: Secondary | ICD-10-CM | POA: Diagnosis not present

## 2019-08-07 DIAGNOSIS — Z992 Dependence on renal dialysis: Secondary | ICD-10-CM | POA: Diagnosis not present

## 2019-08-07 DIAGNOSIS — N186 End stage renal disease: Secondary | ICD-10-CM | POA: Diagnosis not present

## 2019-08-09 DIAGNOSIS — N2581 Secondary hyperparathyroidism of renal origin: Secondary | ICD-10-CM | POA: Diagnosis not present

## 2019-08-09 DIAGNOSIS — Z992 Dependence on renal dialysis: Secondary | ICD-10-CM | POA: Diagnosis not present

## 2019-08-09 DIAGNOSIS — D509 Iron deficiency anemia, unspecified: Secondary | ICD-10-CM | POA: Diagnosis not present

## 2019-08-09 DIAGNOSIS — N186 End stage renal disease: Secondary | ICD-10-CM | POA: Diagnosis not present

## 2019-08-09 DIAGNOSIS — D631 Anemia in chronic kidney disease: Secondary | ICD-10-CM | POA: Diagnosis not present

## 2019-08-11 DIAGNOSIS — D631 Anemia in chronic kidney disease: Secondary | ICD-10-CM | POA: Diagnosis not present

## 2019-08-11 DIAGNOSIS — N2581 Secondary hyperparathyroidism of renal origin: Secondary | ICD-10-CM | POA: Diagnosis not present

## 2019-08-11 DIAGNOSIS — Z992 Dependence on renal dialysis: Secondary | ICD-10-CM | POA: Diagnosis not present

## 2019-08-11 DIAGNOSIS — N186 End stage renal disease: Secondary | ICD-10-CM | POA: Diagnosis not present

## 2019-08-11 DIAGNOSIS — D509 Iron deficiency anemia, unspecified: Secondary | ICD-10-CM | POA: Diagnosis not present

## 2019-08-14 DIAGNOSIS — D631 Anemia in chronic kidney disease: Secondary | ICD-10-CM | POA: Diagnosis not present

## 2019-08-14 DIAGNOSIS — N2581 Secondary hyperparathyroidism of renal origin: Secondary | ICD-10-CM | POA: Diagnosis not present

## 2019-08-14 DIAGNOSIS — N186 End stage renal disease: Secondary | ICD-10-CM | POA: Diagnosis not present

## 2019-08-14 DIAGNOSIS — D509 Iron deficiency anemia, unspecified: Secondary | ICD-10-CM | POA: Diagnosis not present

## 2019-08-14 DIAGNOSIS — Z992 Dependence on renal dialysis: Secondary | ICD-10-CM | POA: Diagnosis not present

## 2019-08-16 DIAGNOSIS — D509 Iron deficiency anemia, unspecified: Secondary | ICD-10-CM | POA: Diagnosis not present

## 2019-08-16 DIAGNOSIS — D631 Anemia in chronic kidney disease: Secondary | ICD-10-CM | POA: Diagnosis not present

## 2019-08-16 DIAGNOSIS — N2581 Secondary hyperparathyroidism of renal origin: Secondary | ICD-10-CM | POA: Diagnosis not present

## 2019-08-16 DIAGNOSIS — Z992 Dependence on renal dialysis: Secondary | ICD-10-CM | POA: Diagnosis not present

## 2019-08-16 DIAGNOSIS — N186 End stage renal disease: Secondary | ICD-10-CM | POA: Diagnosis not present

## 2019-08-18 DIAGNOSIS — Z992 Dependence on renal dialysis: Secondary | ICD-10-CM | POA: Diagnosis not present

## 2019-08-18 DIAGNOSIS — N186 End stage renal disease: Secondary | ICD-10-CM | POA: Diagnosis not present

## 2019-08-18 DIAGNOSIS — D631 Anemia in chronic kidney disease: Secondary | ICD-10-CM | POA: Diagnosis not present

## 2019-08-18 DIAGNOSIS — D509 Iron deficiency anemia, unspecified: Secondary | ICD-10-CM | POA: Diagnosis not present

## 2019-08-18 DIAGNOSIS — N2581 Secondary hyperparathyroidism of renal origin: Secondary | ICD-10-CM | POA: Diagnosis not present

## 2019-08-21 DIAGNOSIS — N186 End stage renal disease: Secondary | ICD-10-CM | POA: Diagnosis not present

## 2019-08-21 DIAGNOSIS — N2581 Secondary hyperparathyroidism of renal origin: Secondary | ICD-10-CM | POA: Diagnosis not present

## 2019-08-21 DIAGNOSIS — Z992 Dependence on renal dialysis: Secondary | ICD-10-CM | POA: Diagnosis not present

## 2019-08-21 DIAGNOSIS — D509 Iron deficiency anemia, unspecified: Secondary | ICD-10-CM | POA: Diagnosis not present

## 2019-08-21 DIAGNOSIS — D631 Anemia in chronic kidney disease: Secondary | ICD-10-CM | POA: Diagnosis not present

## 2019-08-23 DIAGNOSIS — N2581 Secondary hyperparathyroidism of renal origin: Secondary | ICD-10-CM | POA: Diagnosis not present

## 2019-08-23 DIAGNOSIS — Z992 Dependence on renal dialysis: Secondary | ICD-10-CM | POA: Diagnosis not present

## 2019-08-23 DIAGNOSIS — D631 Anemia in chronic kidney disease: Secondary | ICD-10-CM | POA: Diagnosis not present

## 2019-08-23 DIAGNOSIS — D509 Iron deficiency anemia, unspecified: Secondary | ICD-10-CM | POA: Diagnosis not present

## 2019-08-23 DIAGNOSIS — N186 End stage renal disease: Secondary | ICD-10-CM | POA: Diagnosis not present

## 2019-08-25 DIAGNOSIS — Z992 Dependence on renal dialysis: Secondary | ICD-10-CM | POA: Diagnosis not present

## 2019-08-25 DIAGNOSIS — N186 End stage renal disease: Secondary | ICD-10-CM | POA: Diagnosis not present

## 2019-08-25 DIAGNOSIS — D509 Iron deficiency anemia, unspecified: Secondary | ICD-10-CM | POA: Diagnosis not present

## 2019-08-25 DIAGNOSIS — N2581 Secondary hyperparathyroidism of renal origin: Secondary | ICD-10-CM | POA: Diagnosis not present

## 2019-08-25 DIAGNOSIS — D631 Anemia in chronic kidney disease: Secondary | ICD-10-CM | POA: Diagnosis not present

## 2019-08-28 DIAGNOSIS — Z992 Dependence on renal dialysis: Secondary | ICD-10-CM | POA: Diagnosis not present

## 2019-08-28 DIAGNOSIS — N2581 Secondary hyperparathyroidism of renal origin: Secondary | ICD-10-CM | POA: Diagnosis not present

## 2019-08-28 DIAGNOSIS — D631 Anemia in chronic kidney disease: Secondary | ICD-10-CM | POA: Diagnosis not present

## 2019-08-28 DIAGNOSIS — N186 End stage renal disease: Secondary | ICD-10-CM | POA: Diagnosis not present

## 2019-08-28 DIAGNOSIS — D509 Iron deficiency anemia, unspecified: Secondary | ICD-10-CM | POA: Diagnosis not present

## 2019-08-30 DIAGNOSIS — D631 Anemia in chronic kidney disease: Secondary | ICD-10-CM | POA: Diagnosis not present

## 2019-08-30 DIAGNOSIS — N186 End stage renal disease: Secondary | ICD-10-CM | POA: Diagnosis not present

## 2019-08-30 DIAGNOSIS — N2581 Secondary hyperparathyroidism of renal origin: Secondary | ICD-10-CM | POA: Diagnosis not present

## 2019-08-30 DIAGNOSIS — Z992 Dependence on renal dialysis: Secondary | ICD-10-CM | POA: Diagnosis not present

## 2019-08-30 DIAGNOSIS — D509 Iron deficiency anemia, unspecified: Secondary | ICD-10-CM | POA: Diagnosis not present

## 2019-09-01 DIAGNOSIS — Z992 Dependence on renal dialysis: Secondary | ICD-10-CM | POA: Diagnosis not present

## 2019-09-01 DIAGNOSIS — N2581 Secondary hyperparathyroidism of renal origin: Secondary | ICD-10-CM | POA: Diagnosis not present

## 2019-09-01 DIAGNOSIS — D631 Anemia in chronic kidney disease: Secondary | ICD-10-CM | POA: Diagnosis not present

## 2019-09-01 DIAGNOSIS — N186 End stage renal disease: Secondary | ICD-10-CM | POA: Diagnosis not present

## 2019-09-01 DIAGNOSIS — D509 Iron deficiency anemia, unspecified: Secondary | ICD-10-CM | POA: Diagnosis not present

## 2019-09-04 DIAGNOSIS — D631 Anemia in chronic kidney disease: Secondary | ICD-10-CM | POA: Diagnosis not present

## 2019-09-04 DIAGNOSIS — D509 Iron deficiency anemia, unspecified: Secondary | ICD-10-CM | POA: Diagnosis not present

## 2019-09-04 DIAGNOSIS — N2581 Secondary hyperparathyroidism of renal origin: Secondary | ICD-10-CM | POA: Diagnosis not present

## 2019-09-04 DIAGNOSIS — N186 End stage renal disease: Secondary | ICD-10-CM | POA: Diagnosis not present

## 2019-09-04 DIAGNOSIS — Z992 Dependence on renal dialysis: Secondary | ICD-10-CM | POA: Diagnosis not present

## 2019-09-06 DIAGNOSIS — D509 Iron deficiency anemia, unspecified: Secondary | ICD-10-CM | POA: Diagnosis not present

## 2019-09-06 DIAGNOSIS — D631 Anemia in chronic kidney disease: Secondary | ICD-10-CM | POA: Diagnosis not present

## 2019-09-06 DIAGNOSIS — N2581 Secondary hyperparathyroidism of renal origin: Secondary | ICD-10-CM | POA: Diagnosis not present

## 2019-09-06 DIAGNOSIS — N186 End stage renal disease: Secondary | ICD-10-CM | POA: Diagnosis not present

## 2019-09-06 DIAGNOSIS — Z992 Dependence on renal dialysis: Secondary | ICD-10-CM | POA: Diagnosis not present

## 2019-09-08 DIAGNOSIS — N2581 Secondary hyperparathyroidism of renal origin: Secondary | ICD-10-CM | POA: Diagnosis not present

## 2019-09-08 DIAGNOSIS — Z992 Dependence on renal dialysis: Secondary | ICD-10-CM | POA: Diagnosis not present

## 2019-09-08 DIAGNOSIS — N186 End stage renal disease: Secondary | ICD-10-CM | POA: Diagnosis not present

## 2019-09-08 DIAGNOSIS — D509 Iron deficiency anemia, unspecified: Secondary | ICD-10-CM | POA: Diagnosis not present

## 2019-09-08 DIAGNOSIS — D631 Anemia in chronic kidney disease: Secondary | ICD-10-CM | POA: Diagnosis not present

## 2019-09-11 DIAGNOSIS — N2581 Secondary hyperparathyroidism of renal origin: Secondary | ICD-10-CM | POA: Diagnosis not present

## 2019-09-11 DIAGNOSIS — D509 Iron deficiency anemia, unspecified: Secondary | ICD-10-CM | POA: Diagnosis not present

## 2019-09-11 DIAGNOSIS — Z992 Dependence on renal dialysis: Secondary | ICD-10-CM | POA: Diagnosis not present

## 2019-09-11 DIAGNOSIS — N186 End stage renal disease: Secondary | ICD-10-CM | POA: Diagnosis not present

## 2019-09-11 DIAGNOSIS — D631 Anemia in chronic kidney disease: Secondary | ICD-10-CM | POA: Diagnosis not present

## 2019-09-13 DIAGNOSIS — D631 Anemia in chronic kidney disease: Secondary | ICD-10-CM | POA: Diagnosis not present

## 2019-09-13 DIAGNOSIS — Z992 Dependence on renal dialysis: Secondary | ICD-10-CM | POA: Diagnosis not present

## 2019-09-13 DIAGNOSIS — D509 Iron deficiency anemia, unspecified: Secondary | ICD-10-CM | POA: Diagnosis not present

## 2019-09-13 DIAGNOSIS — N186 End stage renal disease: Secondary | ICD-10-CM | POA: Diagnosis not present

## 2019-09-13 DIAGNOSIS — N2581 Secondary hyperparathyroidism of renal origin: Secondary | ICD-10-CM | POA: Diagnosis not present

## 2019-09-14 DIAGNOSIS — D631 Anemia in chronic kidney disease: Secondary | ICD-10-CM | POA: Diagnosis not present

## 2019-09-14 DIAGNOSIS — N186 End stage renal disease: Secondary | ICD-10-CM | POA: Diagnosis not present

## 2019-09-14 DIAGNOSIS — N2581 Secondary hyperparathyroidism of renal origin: Secondary | ICD-10-CM | POA: Diagnosis not present

## 2019-09-14 DIAGNOSIS — D509 Iron deficiency anemia, unspecified: Secondary | ICD-10-CM | POA: Diagnosis not present

## 2019-09-14 DIAGNOSIS — Z992 Dependence on renal dialysis: Secondary | ICD-10-CM | POA: Diagnosis not present

## 2019-09-15 DIAGNOSIS — Z992 Dependence on renal dialysis: Secondary | ICD-10-CM | POA: Diagnosis not present

## 2019-09-15 DIAGNOSIS — D509 Iron deficiency anemia, unspecified: Secondary | ICD-10-CM | POA: Diagnosis not present

## 2019-09-15 DIAGNOSIS — N186 End stage renal disease: Secondary | ICD-10-CM | POA: Diagnosis not present

## 2019-09-15 DIAGNOSIS — D631 Anemia in chronic kidney disease: Secondary | ICD-10-CM | POA: Diagnosis not present

## 2019-09-15 DIAGNOSIS — N2581 Secondary hyperparathyroidism of renal origin: Secondary | ICD-10-CM | POA: Diagnosis not present

## 2019-09-18 DIAGNOSIS — N186 End stage renal disease: Secondary | ICD-10-CM | POA: Diagnosis not present

## 2019-09-18 DIAGNOSIS — D631 Anemia in chronic kidney disease: Secondary | ICD-10-CM | POA: Diagnosis not present

## 2019-09-18 DIAGNOSIS — N2581 Secondary hyperparathyroidism of renal origin: Secondary | ICD-10-CM | POA: Diagnosis not present

## 2019-09-18 DIAGNOSIS — D509 Iron deficiency anemia, unspecified: Secondary | ICD-10-CM | POA: Diagnosis not present

## 2019-09-18 DIAGNOSIS — Z992 Dependence on renal dialysis: Secondary | ICD-10-CM | POA: Diagnosis not present

## 2019-09-20 DIAGNOSIS — N2581 Secondary hyperparathyroidism of renal origin: Secondary | ICD-10-CM | POA: Diagnosis not present

## 2019-09-20 DIAGNOSIS — Z992 Dependence on renal dialysis: Secondary | ICD-10-CM | POA: Diagnosis not present

## 2019-09-20 DIAGNOSIS — D509 Iron deficiency anemia, unspecified: Secondary | ICD-10-CM | POA: Diagnosis not present

## 2019-09-20 DIAGNOSIS — D631 Anemia in chronic kidney disease: Secondary | ICD-10-CM | POA: Diagnosis not present

## 2019-09-20 DIAGNOSIS — N186 End stage renal disease: Secondary | ICD-10-CM | POA: Diagnosis not present

## 2019-09-21 DIAGNOSIS — Z992 Dependence on renal dialysis: Secondary | ICD-10-CM | POA: Diagnosis not present

## 2019-09-21 DIAGNOSIS — D509 Iron deficiency anemia, unspecified: Secondary | ICD-10-CM | POA: Diagnosis not present

## 2019-09-21 DIAGNOSIS — N2581 Secondary hyperparathyroidism of renal origin: Secondary | ICD-10-CM | POA: Diagnosis not present

## 2019-09-21 DIAGNOSIS — T82898A Other specified complication of vascular prosthetic devices, implants and grafts, initial encounter: Secondary | ICD-10-CM | POA: Diagnosis not present

## 2019-09-21 DIAGNOSIS — Z23 Encounter for immunization: Secondary | ICD-10-CM | POA: Diagnosis not present

## 2019-09-21 DIAGNOSIS — D631 Anemia in chronic kidney disease: Secondary | ICD-10-CM | POA: Diagnosis not present

## 2019-09-21 DIAGNOSIS — N186 End stage renal disease: Secondary | ICD-10-CM | POA: Diagnosis not present

## 2019-09-22 DIAGNOSIS — D509 Iron deficiency anemia, unspecified: Secondary | ICD-10-CM | POA: Diagnosis not present

## 2019-09-22 DIAGNOSIS — N2581 Secondary hyperparathyroidism of renal origin: Secondary | ICD-10-CM | POA: Diagnosis not present

## 2019-09-22 DIAGNOSIS — Z23 Encounter for immunization: Secondary | ICD-10-CM | POA: Diagnosis not present

## 2019-09-22 DIAGNOSIS — N186 End stage renal disease: Secondary | ICD-10-CM | POA: Diagnosis not present

## 2019-09-22 DIAGNOSIS — T82898A Other specified complication of vascular prosthetic devices, implants and grafts, initial encounter: Secondary | ICD-10-CM | POA: Diagnosis not present

## 2019-09-22 DIAGNOSIS — Z992 Dependence on renal dialysis: Secondary | ICD-10-CM | POA: Diagnosis not present

## 2019-09-25 DIAGNOSIS — D509 Iron deficiency anemia, unspecified: Secondary | ICD-10-CM | POA: Diagnosis not present

## 2019-09-25 DIAGNOSIS — N186 End stage renal disease: Secondary | ICD-10-CM | POA: Diagnosis not present

## 2019-09-25 DIAGNOSIS — Z992 Dependence on renal dialysis: Secondary | ICD-10-CM | POA: Diagnosis not present

## 2019-09-25 DIAGNOSIS — Z23 Encounter for immunization: Secondary | ICD-10-CM | POA: Diagnosis not present

## 2019-09-25 DIAGNOSIS — T82898A Other specified complication of vascular prosthetic devices, implants and grafts, initial encounter: Secondary | ICD-10-CM | POA: Diagnosis not present

## 2019-09-25 DIAGNOSIS — N2581 Secondary hyperparathyroidism of renal origin: Secondary | ICD-10-CM | POA: Diagnosis not present

## 2019-09-27 DIAGNOSIS — Z992 Dependence on renal dialysis: Secondary | ICD-10-CM | POA: Diagnosis not present

## 2019-09-27 DIAGNOSIS — Z23 Encounter for immunization: Secondary | ICD-10-CM | POA: Diagnosis not present

## 2019-09-27 DIAGNOSIS — T82898A Other specified complication of vascular prosthetic devices, implants and grafts, initial encounter: Secondary | ICD-10-CM | POA: Diagnosis not present

## 2019-09-27 DIAGNOSIS — N2581 Secondary hyperparathyroidism of renal origin: Secondary | ICD-10-CM | POA: Diagnosis not present

## 2019-09-27 DIAGNOSIS — D509 Iron deficiency anemia, unspecified: Secondary | ICD-10-CM | POA: Diagnosis not present

## 2019-09-27 DIAGNOSIS — N186 End stage renal disease: Secondary | ICD-10-CM | POA: Diagnosis not present

## 2019-09-29 DIAGNOSIS — D509 Iron deficiency anemia, unspecified: Secondary | ICD-10-CM | POA: Diagnosis not present

## 2019-09-29 DIAGNOSIS — Z23 Encounter for immunization: Secondary | ICD-10-CM | POA: Diagnosis not present

## 2019-09-29 DIAGNOSIS — T82898A Other specified complication of vascular prosthetic devices, implants and grafts, initial encounter: Secondary | ICD-10-CM | POA: Diagnosis not present

## 2019-09-29 DIAGNOSIS — Z992 Dependence on renal dialysis: Secondary | ICD-10-CM | POA: Diagnosis not present

## 2019-09-29 DIAGNOSIS — N2581 Secondary hyperparathyroidism of renal origin: Secondary | ICD-10-CM | POA: Diagnosis not present

## 2019-09-29 DIAGNOSIS — N186 End stage renal disease: Secondary | ICD-10-CM | POA: Diagnosis not present

## 2019-10-02 DIAGNOSIS — T82898A Other specified complication of vascular prosthetic devices, implants and grafts, initial encounter: Secondary | ICD-10-CM | POA: Diagnosis not present

## 2019-10-02 DIAGNOSIS — N2581 Secondary hyperparathyroidism of renal origin: Secondary | ICD-10-CM | POA: Diagnosis not present

## 2019-10-02 DIAGNOSIS — N186 End stage renal disease: Secondary | ICD-10-CM | POA: Diagnosis not present

## 2019-10-02 DIAGNOSIS — Z23 Encounter for immunization: Secondary | ICD-10-CM | POA: Diagnosis not present

## 2019-10-02 DIAGNOSIS — Z992 Dependence on renal dialysis: Secondary | ICD-10-CM | POA: Diagnosis not present

## 2019-10-02 DIAGNOSIS — D509 Iron deficiency anemia, unspecified: Secondary | ICD-10-CM | POA: Diagnosis not present

## 2019-10-04 DIAGNOSIS — D509 Iron deficiency anemia, unspecified: Secondary | ICD-10-CM | POA: Diagnosis not present

## 2019-10-04 DIAGNOSIS — Z992 Dependence on renal dialysis: Secondary | ICD-10-CM | POA: Diagnosis not present

## 2019-10-04 DIAGNOSIS — N186 End stage renal disease: Secondary | ICD-10-CM | POA: Diagnosis not present

## 2019-10-04 DIAGNOSIS — Z23 Encounter for immunization: Secondary | ICD-10-CM | POA: Diagnosis not present

## 2019-10-04 DIAGNOSIS — N2581 Secondary hyperparathyroidism of renal origin: Secondary | ICD-10-CM | POA: Diagnosis not present

## 2019-10-04 DIAGNOSIS — T82898A Other specified complication of vascular prosthetic devices, implants and grafts, initial encounter: Secondary | ICD-10-CM | POA: Diagnosis not present

## 2019-10-06 DIAGNOSIS — D509 Iron deficiency anemia, unspecified: Secondary | ICD-10-CM | POA: Diagnosis not present

## 2019-10-06 DIAGNOSIS — T82898A Other specified complication of vascular prosthetic devices, implants and grafts, initial encounter: Secondary | ICD-10-CM | POA: Diagnosis not present

## 2019-10-06 DIAGNOSIS — N186 End stage renal disease: Secondary | ICD-10-CM | POA: Diagnosis not present

## 2019-10-06 DIAGNOSIS — Z23 Encounter for immunization: Secondary | ICD-10-CM | POA: Diagnosis not present

## 2019-10-06 DIAGNOSIS — Z992 Dependence on renal dialysis: Secondary | ICD-10-CM | POA: Diagnosis not present

## 2019-10-06 DIAGNOSIS — N2581 Secondary hyperparathyroidism of renal origin: Secondary | ICD-10-CM | POA: Diagnosis not present

## 2019-10-09 DIAGNOSIS — N2581 Secondary hyperparathyroidism of renal origin: Secondary | ICD-10-CM | POA: Diagnosis not present

## 2019-10-09 DIAGNOSIS — T82898A Other specified complication of vascular prosthetic devices, implants and grafts, initial encounter: Secondary | ICD-10-CM | POA: Diagnosis not present

## 2019-10-09 DIAGNOSIS — D509 Iron deficiency anemia, unspecified: Secondary | ICD-10-CM | POA: Diagnosis not present

## 2019-10-09 DIAGNOSIS — Z23 Encounter for immunization: Secondary | ICD-10-CM | POA: Diagnosis not present

## 2019-10-09 DIAGNOSIS — Z992 Dependence on renal dialysis: Secondary | ICD-10-CM | POA: Diagnosis not present

## 2019-10-09 DIAGNOSIS — N186 End stage renal disease: Secondary | ICD-10-CM | POA: Diagnosis not present

## 2019-10-10 ENCOUNTER — Other Ambulatory Visit (INDEPENDENT_AMBULATORY_CARE_PROVIDER_SITE_OTHER): Payer: Self-pay | Admitting: Vascular Surgery

## 2019-10-10 DIAGNOSIS — N186 End stage renal disease: Secondary | ICD-10-CM

## 2019-10-11 DIAGNOSIS — D509 Iron deficiency anemia, unspecified: Secondary | ICD-10-CM | POA: Diagnosis not present

## 2019-10-11 DIAGNOSIS — Z23 Encounter for immunization: Secondary | ICD-10-CM | POA: Diagnosis not present

## 2019-10-11 DIAGNOSIS — Z992 Dependence on renal dialysis: Secondary | ICD-10-CM | POA: Diagnosis not present

## 2019-10-11 DIAGNOSIS — N2581 Secondary hyperparathyroidism of renal origin: Secondary | ICD-10-CM | POA: Diagnosis not present

## 2019-10-11 DIAGNOSIS — T82898A Other specified complication of vascular prosthetic devices, implants and grafts, initial encounter: Secondary | ICD-10-CM | POA: Diagnosis not present

## 2019-10-11 DIAGNOSIS — N186 End stage renal disease: Secondary | ICD-10-CM | POA: Diagnosis not present

## 2019-10-12 ENCOUNTER — Ambulatory Visit (INDEPENDENT_AMBULATORY_CARE_PROVIDER_SITE_OTHER): Payer: Medicare Other

## 2019-10-12 ENCOUNTER — Ambulatory Visit (INDEPENDENT_AMBULATORY_CARE_PROVIDER_SITE_OTHER): Payer: Medicare Other | Admitting: Nurse Practitioner

## 2019-10-12 ENCOUNTER — Encounter (INDEPENDENT_AMBULATORY_CARE_PROVIDER_SITE_OTHER): Payer: Self-pay | Admitting: Nurse Practitioner

## 2019-10-12 ENCOUNTER — Other Ambulatory Visit: Payer: Self-pay

## 2019-10-12 VITALS — BP 131/61 | HR 69 | Resp 16 | Wt 172.0 lb

## 2019-10-12 DIAGNOSIS — E782 Mixed hyperlipidemia: Secondary | ICD-10-CM

## 2019-10-12 DIAGNOSIS — T82898A Other specified complication of vascular prosthetic devices, implants and grafts, initial encounter: Secondary | ICD-10-CM | POA: Diagnosis not present

## 2019-10-12 DIAGNOSIS — Z992 Dependence on renal dialysis: Secondary | ICD-10-CM | POA: Diagnosis not present

## 2019-10-12 DIAGNOSIS — N2581 Secondary hyperparathyroidism of renal origin: Secondary | ICD-10-CM | POA: Diagnosis not present

## 2019-10-12 DIAGNOSIS — I10 Essential (primary) hypertension: Secondary | ICD-10-CM | POA: Diagnosis not present

## 2019-10-12 DIAGNOSIS — D509 Iron deficiency anemia, unspecified: Secondary | ICD-10-CM | POA: Diagnosis not present

## 2019-10-12 DIAGNOSIS — N186 End stage renal disease: Secondary | ICD-10-CM

## 2019-10-12 DIAGNOSIS — Z23 Encounter for immunization: Secondary | ICD-10-CM | POA: Diagnosis not present

## 2019-10-12 DIAGNOSIS — R2681 Unsteadiness on feet: Secondary | ICD-10-CM | POA: Insufficient documentation

## 2019-10-12 DIAGNOSIS — R911 Solitary pulmonary nodule: Secondary | ICD-10-CM | POA: Insufficient documentation

## 2019-10-13 DIAGNOSIS — T82898A Other specified complication of vascular prosthetic devices, implants and grafts, initial encounter: Secondary | ICD-10-CM | POA: Diagnosis not present

## 2019-10-13 DIAGNOSIS — N2581 Secondary hyperparathyroidism of renal origin: Secondary | ICD-10-CM | POA: Diagnosis not present

## 2019-10-13 DIAGNOSIS — D509 Iron deficiency anemia, unspecified: Secondary | ICD-10-CM | POA: Diagnosis not present

## 2019-10-13 DIAGNOSIS — Z992 Dependence on renal dialysis: Secondary | ICD-10-CM | POA: Diagnosis not present

## 2019-10-13 DIAGNOSIS — Z23 Encounter for immunization: Secondary | ICD-10-CM | POA: Diagnosis not present

## 2019-10-13 DIAGNOSIS — N186 End stage renal disease: Secondary | ICD-10-CM | POA: Diagnosis not present

## 2019-10-16 ENCOUNTER — Encounter (INDEPENDENT_AMBULATORY_CARE_PROVIDER_SITE_OTHER): Payer: Self-pay | Admitting: Nurse Practitioner

## 2019-10-16 DIAGNOSIS — T82898A Other specified complication of vascular prosthetic devices, implants and grafts, initial encounter: Secondary | ICD-10-CM | POA: Diagnosis not present

## 2019-10-16 DIAGNOSIS — E119 Type 2 diabetes mellitus without complications: Secondary | ICD-10-CM | POA: Diagnosis not present

## 2019-10-16 DIAGNOSIS — Z794 Long term (current) use of insulin: Secondary | ICD-10-CM | POA: Diagnosis not present

## 2019-10-16 DIAGNOSIS — Z992 Dependence on renal dialysis: Secondary | ICD-10-CM | POA: Diagnosis not present

## 2019-10-16 DIAGNOSIS — D509 Iron deficiency anemia, unspecified: Secondary | ICD-10-CM | POA: Diagnosis not present

## 2019-10-16 DIAGNOSIS — N186 End stage renal disease: Secondary | ICD-10-CM | POA: Diagnosis not present

## 2019-10-16 DIAGNOSIS — Z23 Encounter for immunization: Secondary | ICD-10-CM | POA: Diagnosis not present

## 2019-10-16 DIAGNOSIS — N2581 Secondary hyperparathyroidism of renal origin: Secondary | ICD-10-CM | POA: Diagnosis not present

## 2019-10-16 NOTE — Progress Notes (Signed)
SUBJECTIVE:  Patient ID: Richard Zavala, male    DOB: 06-Mar-1961, 58 y.o.   MRN: [id number] Chief Complaint  Patient presents with  . Follow-up    ref Candiss Norse for HDA    HPI  Richard Zavala is a 58 y.o. male that presents today with concern of his AV fistula due to multiple infiltrations as well as enlarging aneurysms.  He is referred by Dr. Candiss Norse.  The patient states that he has had issues recently with dialysis and having some infiltrations.  The patient does have noted edematous tissue anterior to the mid forearm.  The patient also has several aneurysmal areas of his fistula.  The fistula appears to have a small shallow ulceration with some skin threatening present.  The patient contends that the area that appears to have a shallow ulceration is due to ripping take off of the fistula.  The patient does endorse he has some bleeding after dialysis which also continues into the next day sometimes.  This is also confirmed by his family member.  Otherwise the patient denies any further issues with dialysis.  Today the patient's flow volume is 1051.  His left radiocephalic AV fistula is patent with normal flow and no internal stenosis.  There is an aneurysmal dilatation of the mid to distal forearm with velocities of less than 100 cm/s throughout the outflow vein.  The largest measurement of the aneurysmal parts are 2.34 cm.  There is also edematous tissue noted anterior to the mid forearm level outflow vein.  Past Medical History:  Diagnosis Date  . Anemia   . Blind   . BPH (benign prostatic hyperplasia)   . CAD (coronary artery disease)   . CHF (congestive heart failure) ([place])   . Chronic Zavala disease (CKD), stage IV (severe) (HCC)    DIALYSIS  . Diabetes mellitus without complication ([place])   . ESRD (end stage renal disease) ([place])    Monday-Wednesday-Friday dialysis  . Hyperlipemia   . Hypertension   . Neuropathy   . Osteoporosis   . Pulmonary HTN ([place])   . Stroke [facility])    2013   . TIA (transient ischemic attack)   . TRD (traction retinal detachment)   . Wilms' tumor [facility])     Past Surgical History:  Procedure Laterality Date  . A/V FISTULAGRAM Left 01/28/2017   Procedure: A/V Fistulagram;  Surgeon: Algernon Huxley, MD;  Location: [place] [facility];  Service: Cardiovascular;  Laterality: Left;  . A/V FISTULAGRAM Left 04/28/2018   Procedure: A/V FISTULAGRAM;  Surgeon: Algernon Huxley, MD;  Location: [place] [facility];  Service: Cardiovascular;  Laterality: Left;  . AV FISTULA PLACEMENT    . CARDIAC CATHETERIZATION    . CATARACT EXTRACTION W/PHACO Right 05/14/2016   Procedure: CATARACT EXTRACTION PHACO AND INTRAOCULAR LENS PLACEMENT (IOC);  Surgeon: Eulogio Bear, MD;  Location: [facility] ORS;  Service: Ophthalmology;  Laterality: Right;  [place] 1.46 AP% 11.5 CDE 12.33 FLUID PACK LOT # [id number] H  . EYE SURGERY    . FRACTURE SURGERY     RIGHT LEG WITH ROD  . HIP SURGERY    . NEPHRECTOMY RADICAL    . PERIPHERAL VASCULAR CATHETERIZATION N/A 08/28/2015   Procedure: A/V Shuntogram/Fistulagram;  Surgeon: Algernon Huxley, MD;  Location: [place] [facility];  Service: Cardiovascular;  Laterality: N/A;  . PERIPHERAL VASCULAR CATHETERIZATION Left 08/28/2015   Procedure: A/V Shunt Intervention;  Surgeon: Algernon Huxley, MD;  Location: [place] [facility];  Service: Cardiovascular;  Laterality: Left;    Social History   Socioeconomic History  . Marital status: Married    Spouse name: Not on file  . Number of children: Not on file  . Years of education: Not on file  . Highest education level: Not on file  Occupational History  . Not on file  Social Needs  . Financial resource strain: Not on file  . Food insecurity    Worry: Not on file    Inability: Not on file  . Transportation needs    Medical: Not on file    Non-medical: Not on file  Tobacco Use  . Smoking status: Former Smoker    Packs/day: 1.00    Types: Cigarettes    Quit date: 06/26/2012    Years since quitting: 7.3  .  Smokeless tobacco: Never Used  Substance and Sexual Activity  . Alcohol use: No  . Drug use: No  . Sexual activity: Never  Lifestyle  . Physical activity    Days per week: Not on file    Minutes per session: Not on file  . Stress: Not on file  Relationships  . Social Herbalist on phone: Not on file    Gets together: Not on file    Attends religious service: Not on file    Active member of club or organization: Not on file    Attends meetings of clubs or organizations: Not on file    Relationship status: Not on file  . Intimate partner violence    Fear of current or ex partner: Not on file    Emotionally abused: Not on file    Physically abused: Not on file    Forced sexual activity: Not on file  Other Topics Concern  . Not on file  Social History Narrative   Lives at home with his children. Ambulates well at baseline.    Family History  Problem Relation Age of Onset  . CAD Father   . COPD Father   . CAD Paternal Grandfather   . Diabetes Maternal Aunt   . Diabetes Maternal Uncle     Allergies  Allergen Reactions  . Metformin Other (See Comments)    Severe diarrhea   . Penicillins Hives and Other (See Comments)    Has patient had a PCN reaction causing immediate rash, facial/tongue/throat swelling, SOB or lightheadedness with hypotension:Yes Has patient had a PCN reaction causing severe rash involving mucus membranes or skin necrosis:Yes Has patient had a PCN reaction that required hospitalization:inpatient at the time of reaction. Has patient had a PCN reaction occurring within the last 10 years:No If all of the above answers are "NO", then may proceed with Cephalosporin use.      Review of Systems   Review of Systems: Negative Unless Checked Constitutional: [] Weight loss  [] Fever  [] Chills Cardiac: [] Chest pain   []  Atrial Fibrillation  [] Palpitations   [] Shortness of breath when laying flat   [] Shortness of breath with exertion. [] Shortness of  breath at rest Vascular:  [] Pain in legs with walking   [] Pain in legs with standing [] Pain in legs when laying flat   [] Claudication    [] Pain in feet when laying flat    [] History of DVT   [] Phlebitis   [] Swelling in legs   [] Varicose veins   [] Non-healing ulcers Pulmonary:   [] Uses home oxygen   [] Productive cough   [] Hemoptysis   [] Wheeze  [] COPD   [] Asthma Neurologic:  [] Dizziness   [] Seizures  [] Blackouts [  x]History of stroke   [] History of TIA  [] Aphasia   [x]  Blindness   [] Weakness or numbness in arm   [] Weakness or numbness in leg Musculoskeletal:   [] Joint swelling   [] Joint pain   [] Low back pain  []  History of Knee Replacement [x] Arthritis [] back Surgeries  []  Spinal Stenosis    Hematologic:  [] Easy bruising  [] Easy bleeding   [] Hypercoagulable state   [x] Anemic Gastrointestinal:  [] Diarrhea   [] Vomiting  [] Gastroesophageal reflux/heartburn   [] Difficulty swallowing. [] Abdominal pain Genitourinary:  [x] Chronic Zavala disease   [] Difficult urination  [] Anuric   [] Blood in urine [] Frequent urination  [] Burning with urination   [] Hematuria Skin:  [] Rashes   [] Ulcers [] Wounds Psychological:  [] History of anxiety   []  History of major depression  []  Memory Difficulties      OBJECTIVE:   Physical Exam  BP 131/61 (BP Location: Right Arm)   Pulse 69   Resp 16   Wt 172 lb (78 kg)   BMI 26.54 kg/m   Gen: WD/WN, NAD Head: O'/AT, No temporalis wasting.  Ear/Nose/Throat: Hearing grossly intact, nares w/o erythema or drainage Eyes:sclera nonicteric.  Neck: Supple, no masses.  No JVD.  Pulmonary:  Good air movement, no use of accessory muscles.  Cardiac: RRR Vascular:  Present thrill and bruit, small shallow ulcerated area near distal portion fistula. Vessel Right Left  Radial Palpable Palpable   Gastrointestinal: soft, non-distended. No guarding/no peritoneal signs.  Musculoskeletal: M/S 5/5 throughout.  No deformity or atrophy.  Neurologic: Pain and light touch intact in  extremities.  Symmetrical.  Speech is fluent. Motor exam as listed above. Psychiatric: Judgment intact, Mood & affect appropriate for pt's clinical situation. Dermatologic: No Venous rashes. No Ulcers Noted.  No changes consistent with cellulitis. Lymph : No Cervical lymphadenopathy, no lichenification or skin changes of chronic lymphedema.       ASSESSMENT AND PLAN:  1. End stage renal disease ([place]) Based upon the patient's aneurysmal fistula with some concerning signs of skin threatening and ulceration I discussed with the patient options for revision of his AV fistula.  The first discussed ligating his aneurysmal fistula utilizing a jump graft in order to save his current access site without utilizing a new one.  However, this actually required the patient to have a PermCath for some time while this area healed.  The patient is absolutely adamant that he does not want a PermCath.  I also discussed the patient that by performing vein mapping we may be able to find another location in either arm to place a new access site.  At this time this is the patient's preference.  I discussed that while the new access site is healing the patient can continue to utilize his old wound.  I also discussed the patient that if his old site should begin to bleed or become unusable before the drain site matures he will likely still need PermCath placement.  We will have the patient follow-up with vein mapping and then discuss his options once again. - VAS [place] UPPER EXTREMITY VEIN MAPPING; Future  2. Mixed hyperlipidemia Continue statin as ordered and reviewed, no changes at this time   3. Essential hypertension Continue antihypertensive medications as already ordered, these medications have been reviewed and there are no changes at this time.    Current Outpatient Medications on File Prior to Visit  Medication Sig Dispense Refill  . amLODipine (NORVASC) 10 MG tablet Take 1 tablet (10 mg total) by mouth at  bedtime. 30 tablet  0  . atorvastatin (LIPITOR) 80 MG tablet Take 1 tablet (80 mg total) by mouth daily. (Patient taking differently: Take 80 mg by mouth at bedtime. ) 30 tablet 2  . carvedilol (COREG) 12.5 MG tablet Take 37.5 mg by mouth 2 (two) times daily.    . clopidogrel (PLAVIX) 75 MG tablet Take 1 tablet (75 mg total) by mouth daily. (Patient taking differently: Take 75 mg by mouth See admin instructions. Take 75 mg by mouth daily on Saturday, Sunday, Tuesday and Thursday) 30 tablet 2  . furosemide (LASIX) 40 MG tablet Take 40 mg by mouth See admin instructions. Take 40 mg by mouth twice daily on Tuesday, Thursday, Saturday and Sunday.    . hydrALAZINE (APRESOLINE) 100 MG tablet Take 100 mg by mouth 3 (three) times daily.    . insulin glargine (LANTUS) 100 UNIT/ML injection Inject 9-10 Units into the skin daily.     . isosorbide mononitrate (IMDUR) 30 MG 24 hr tablet Take 1 tablet (30 mg total) by mouth daily. (Patient taking differently: Take 30 mg by mouth at bedtime. ) 30 tablet 0  . naproxen sodium (ANAPROX) 220 MG tablet Take 220 mg by mouth 2 (two) times daily as needed (for pain or headache).      No current facility-administered medications on file prior to visit.     There are no Patient Instructions on file for this visit. No follow-ups on file.   Kris Hartmann, NP  This note was completed with Sales executive.  Any errors are purely unintentional.

## 2019-10-18 DIAGNOSIS — N186 End stage renal disease: Secondary | ICD-10-CM | POA: Diagnosis not present

## 2019-10-18 DIAGNOSIS — T82898A Other specified complication of vascular prosthetic devices, implants and grafts, initial encounter: Secondary | ICD-10-CM | POA: Diagnosis not present

## 2019-10-18 DIAGNOSIS — N2581 Secondary hyperparathyroidism of renal origin: Secondary | ICD-10-CM | POA: Diagnosis not present

## 2019-10-18 DIAGNOSIS — Z992 Dependence on renal dialysis: Secondary | ICD-10-CM | POA: Diagnosis not present

## 2019-10-18 DIAGNOSIS — Z23 Encounter for immunization: Secondary | ICD-10-CM | POA: Diagnosis not present

## 2019-10-18 DIAGNOSIS — D509 Iron deficiency anemia, unspecified: Secondary | ICD-10-CM | POA: Diagnosis not present

## 2019-10-20 DIAGNOSIS — N2581 Secondary hyperparathyroidism of renal origin: Secondary | ICD-10-CM | POA: Diagnosis not present

## 2019-10-20 DIAGNOSIS — N186 End stage renal disease: Secondary | ICD-10-CM | POA: Diagnosis not present

## 2019-10-20 DIAGNOSIS — Z992 Dependence on renal dialysis: Secondary | ICD-10-CM | POA: Diagnosis not present

## 2019-10-20 DIAGNOSIS — Z23 Encounter for immunization: Secondary | ICD-10-CM | POA: Diagnosis not present

## 2019-10-20 DIAGNOSIS — T82898A Other specified complication of vascular prosthetic devices, implants and grafts, initial encounter: Secondary | ICD-10-CM | POA: Diagnosis not present

## 2019-10-20 DIAGNOSIS — D509 Iron deficiency anemia, unspecified: Secondary | ICD-10-CM | POA: Diagnosis not present

## 2019-10-21 DIAGNOSIS — N186 End stage renal disease: Secondary | ICD-10-CM | POA: Diagnosis not present

## 2019-10-21 DIAGNOSIS — Z992 Dependence on renal dialysis: Secondary | ICD-10-CM | POA: Diagnosis not present

## 2019-10-22 DIAGNOSIS — D509 Iron deficiency anemia, unspecified: Secondary | ICD-10-CM | POA: Diagnosis not present

## 2019-10-22 DIAGNOSIS — Z992 Dependence on renal dialysis: Secondary | ICD-10-CM | POA: Diagnosis not present

## 2019-10-22 DIAGNOSIS — N2581 Secondary hyperparathyroidism of renal origin: Secondary | ICD-10-CM | POA: Diagnosis not present

## 2019-10-22 DIAGNOSIS — D631 Anemia in chronic kidney disease: Secondary | ICD-10-CM | POA: Diagnosis not present

## 2019-10-22 DIAGNOSIS — N186 End stage renal disease: Secondary | ICD-10-CM | POA: Diagnosis not present

## 2019-10-23 DIAGNOSIS — D631 Anemia in chronic kidney disease: Secondary | ICD-10-CM | POA: Diagnosis not present

## 2019-10-23 DIAGNOSIS — D509 Iron deficiency anemia, unspecified: Secondary | ICD-10-CM | POA: Diagnosis not present

## 2019-10-23 DIAGNOSIS — Z992 Dependence on renal dialysis: Secondary | ICD-10-CM | POA: Diagnosis not present

## 2019-10-23 DIAGNOSIS — N186 End stage renal disease: Secondary | ICD-10-CM | POA: Diagnosis not present

## 2019-10-23 DIAGNOSIS — N2581 Secondary hyperparathyroidism of renal origin: Secondary | ICD-10-CM | POA: Diagnosis not present

## 2019-10-24 ENCOUNTER — Other Ambulatory Visit (INDEPENDENT_AMBULATORY_CARE_PROVIDER_SITE_OTHER): Payer: Self-pay | Admitting: Nurse Practitioner

## 2019-10-24 ENCOUNTER — Ambulatory Visit (INDEPENDENT_AMBULATORY_CARE_PROVIDER_SITE_OTHER): Payer: Medicare Other

## 2019-10-24 ENCOUNTER — Encounter (INDEPENDENT_AMBULATORY_CARE_PROVIDER_SITE_OTHER): Payer: Self-pay | Admitting: Nurse Practitioner

## 2019-10-24 ENCOUNTER — Ambulatory Visit (INDEPENDENT_AMBULATORY_CARE_PROVIDER_SITE_OTHER): Payer: Medicare Other | Admitting: Nurse Practitioner

## 2019-10-24 ENCOUNTER — Other Ambulatory Visit: Payer: Self-pay

## 2019-10-24 VITALS — BP 124/67 | HR 69 | Resp 16 | Wt 170.2 lb

## 2019-10-24 DIAGNOSIS — N186 End stage renal disease: Secondary | ICD-10-CM

## 2019-10-24 DIAGNOSIS — E782 Mixed hyperlipidemia: Secondary | ICD-10-CM

## 2019-10-24 DIAGNOSIS — I10 Essential (primary) hypertension: Secondary | ICD-10-CM | POA: Diagnosis not present

## 2019-10-24 NOTE — Progress Notes (Signed)
SUBJECTIVE:  Patient ID: Richard Zavala, male    DOB: 1961-11-04, 58 y.o.   MRN: [id number] Chief Complaint  Patient presents with  . Follow-up    ultrasound follow up    HPI  DEMARQUES Zavala is a 58 y.o. male that presents today with concern of his AV fistula due to multiple infiltrations as well as enlarging aneurysms.  He is referred by Dr. Candiss Norse.  The patient states that he has had issues recently with dialysis and having some infiltrations.  The patient does have noted edematous tissue anterior to the mid forearm.  The patient also has several aneurysmal areas of his fistula.  The fistula appears to have a small shallow ulceration with some skin threatening present.  The patient contends that the area that appears to have a shallow ulceration is due to ripping take off of the fistula.  The patient does endorse he has some bleeding after dialysis which also continues into the next day sometimes.  This is also confirmed by his family member.  Otherwise the patient denies any further issues with dialysis. Today, his fistula is heavily bruised.   The patient underwent vein mapping of his right upper extremity and had adequate vein creation for a right radialcephalic AVF.     Past Medical History:  Diagnosis Date  . Anemia   . Blind   . BPH (benign prostatic hyperplasia)   . CAD (coronary artery disease)   . CHF (congestive heart failure) ([place])   . Chronic kidney disease (CKD), stage IV (severe) (HCC)    DIALYSIS  . Diabetes mellitus without complication ([place])   . ESRD (end stage renal disease) ([place])    Monday-Wednesday-Friday dialysis  . Hyperlipemia   . Hypertension   . Neuropathy   . Osteoporosis   . Pulmonary HTN ([place])   . Stroke [facility])    2013  . TIA (transient ischemic attack)   . TRD (traction retinal detachment)   . Wilms' tumor [facility])     Past Surgical History:  Procedure Laterality Date  . A/V FISTULAGRAM Left 01/28/2017   Procedure: A/V Fistulagram;  Surgeon:  Algernon Huxley, MD;  Location: [place] [facility];  Service: Cardiovascular;  Laterality: Left;  . A/V FISTULAGRAM Left 04/28/2018   Procedure: A/V FISTULAGRAM;  Surgeon: Algernon Huxley, MD;  Location: [place] [facility];  Service: Cardiovascular;  Laterality: Left;  . AV FISTULA PLACEMENT    . CARDIAC CATHETERIZATION    . CATARACT EXTRACTION W/PHACO Right 05/14/2016   Procedure: CATARACT EXTRACTION PHACO AND INTRAOCULAR LENS PLACEMENT (IOC);  Surgeon: Eulogio Bear, MD;  Location: [facility] ORS;  Service: Ophthalmology;  Laterality: Right;  [place] 1.46 AP% 11.5 CDE 12.33 FLUID PACK LOT # [id number] H  . EYE SURGERY    . FRACTURE SURGERY     RIGHT LEG WITH ROD  . HIP SURGERY    . NEPHRECTOMY RADICAL    . PERIPHERAL VASCULAR CATHETERIZATION N/A 08/28/2015   Procedure: A/V Shuntogram/Fistulagram;  Surgeon: Algernon Huxley, MD;  Location: [place] [facility];  Service: Cardiovascular;  Laterality: N/A;  . PERIPHERAL VASCULAR CATHETERIZATION Left 08/28/2015   Procedure: A/V Shunt Intervention;  Surgeon: Algernon Huxley, MD;  Location: [place] [facility];  Service: Cardiovascular;  Laterality: Left;    Social History   Socioeconomic History  . Marital status: Married    Spouse name: Not on file  . Number of children: Not on file  . Years of education: Not on file  .  Highest education level: Not on file  Occupational History  . Not on file  Social Needs  . Financial resource strain: Not on file  . Food insecurity    Worry: Not on file    Inability: Not on file  . Transportation needs    Medical: Not on file    Non-medical: Not on file  Tobacco Use  . Smoking status: Former Smoker    Packs/day: 1.00    Types: Cigarettes    Quit date: 06/26/2012    Years since quitting: 7.3  . Smokeless tobacco: Never Used  Substance and Sexual Activity  . Alcohol use: No  . Drug use: No  . Sexual activity: Never  Lifestyle  . Physical activity    Days per week: Not on file    Minutes per session: Not on file   . Stress: Not on file  Relationships  . Social Herbalist on phone: Not on file    Gets together: Not on file    Attends religious service: Not on file    Active member of club or organization: Not on file    Attends meetings of clubs or organizations: Not on file    Relationship status: Not on file  . Intimate partner violence    Fear of current or ex partner: Not on file    Emotionally abused: Not on file    Physically abused: Not on file    Forced sexual activity: Not on file  Other Topics Concern  . Not on file  Social History Narrative   Lives at home with his children. Ambulates well at baseline.    Family History  Problem Relation Age of Onset  . CAD Father   . COPD Father   . CAD Paternal Grandfather   . Diabetes Maternal Aunt   . Diabetes Maternal Uncle     Allergies  Allergen Reactions  . Metformin Other (See Comments)    Severe diarrhea   . Penicillins Hives and Other (See Comments)    Has patient had a PCN reaction causing immediate rash, facial/tongue/throat swelling, SOB or lightheadedness with hypotension:Yes Has patient had a PCN reaction causing severe rash involving mucus membranes or skin necrosis:Yes Has patient had a PCN reaction that required hospitalization:inpatient at the time of reaction. Has patient had a PCN reaction occurring within the last 10 years:No If all of the above answers are "NO", then may proceed with Cephalosporin use.      Review of Systems   Review of Systems: Negative Unless Checked Constitutional: [] Weight loss  [] Fever  [] Chills Cardiac: [] Chest pain   []  Atrial Fibrillation  [] Palpitations   [] Shortness of breath when laying flat   [] Shortness of breath with exertion. [] Shortness of breath at rest Vascular:  [] Pain in legs with walking   [] Pain in legs with standing [] Pain in legs when laying flat   [] Claudication    [] Pain in feet when laying flat    [] History of DVT   [] Phlebitis   [] Swelling in legs    [] Varicose veins   [] Non-healing ulcers Pulmonary:   [] Uses home oxygen   [] Productive cough   [] Hemoptysis   [] Wheeze  [] COPD   [] Asthma Neurologic:  [] Dizziness   [] Seizures  [] Blackouts [x] History of stroke   [] History of TIA  [] Aphasia   [x] Blindness   [x] Weakness or numbness in arm   [x] Weakness or numbness in leg Musculoskeletal:   [] Joint swelling   [] Joint pain   [] Low back pain  []   History of Knee Replacement [] Arthritis [] back Surgeries  []  Spinal Stenosis    Hematologic:  [] Easy bruising  [] Easy bleeding   [] Hypercoagulable state   [x] Anemic Gastrointestinal:  [] Diarrhea   [] Vomiting  [] Gastroesophageal reflux/heartburn   [] Difficulty swallowing. [] Abdominal pain Genitourinary:  [x] Chronic kidney disease   [] Difficult urination  [] Anuric   [] Blood in urine [] Frequent urination  [] Burning with urination   [] Hematuria Skin:  [] Rashes   [] Ulcers [] Wounds Psychological:  [] History of anxiety   []  History of major depression  []  Memory Difficulties      OBJECTIVE:   Physical Exam  BP 124/67 (BP Location: Right Arm)   Pulse 69   Resp 16   Wt 170 lb 3.1 oz (77.2 kg)   BMI 26.26 kg/m   Gen: WD/WN, NAD Head: /AT, No temporalis wasting.  Ear/Nose/Throat: Hearing grossly intact, nares w/o erythema or drainage Eyes: blind in left eye.  Neck: Supple, no masses.  No JVD.  Pulmonary:  Good air movement, no use of accessory muscles.  Cardiac: RRR Vascular: good thrill and bruit on current fistula, heavily bruise Vessel Right Left  Radial Palpable Palpable   Gastrointestinal: soft, non-distended. No guarding/no peritoneal signs.  Musculoskeletal: Uses walker for ambulation.  No deformity or atrophy.  Neurologic: Pain and light touch intact in extremities.  Symmetrical.  Speech is fluent. Motor exam as listed above. Psychiatric: Judgment intact, Mood & affect appropriate for pt's clinical situation. Dermatologic: No Venous rashes. No Ulcers Noted.  No changes consistent with  cellulitis. Lymph : No Cervical lymphadenopathy, no lichenification or skin changes of chronic lymphedema.       ASSESSMENT AND PLAN:  1. End stage renal disease ([place]) Recommend:  At this time the patient has an aneurysmal left radial cephalic fistula with is beginning to show signs of skin threatening and degeneration.  He will continue to use this AVF while his new fistula matures.  I also discussed the possibility of doing a jump graft with his current AVF however the patient is absolutely adamant that he does not wish to have a perm cath placed while the jump graft healed.  Therefore he will need to have a new fistula placed.    Patient should have a right radial cephalic AVF created.  The risks, benefits and alternative therapies were reviewed in detail with the patient.  All questions were answered.  The patient agrees to proceed with surgery.    2. Essential hypertension Continue antihypertensive medications as already ordered, these medications have been reviewed and there are no changes at this time.   3. Mixed hyperlipidemia Continue statin as ordered and reviewed, no changes at this time    Current Outpatient Medications on File Prior to Visit  Medication Sig Dispense Refill  . amLODipine (NORVASC) 10 MG tablet Take 1 tablet (10 mg total) by mouth at bedtime. 30 tablet 0  . atorvastatin (LIPITOR) 80 MG tablet Take 1 tablet (80 mg total) by mouth daily. (Patient taking differently: Take 80 mg by mouth at bedtime. ) 30 tablet 2  . carvedilol (COREG) 12.5 MG tablet Take 37.5 mg by mouth 2 (two) times daily.    . clopidogrel (PLAVIX) 75 MG tablet Take 1 tablet (75 mg total) by mouth daily. (Patient taking differently: Take 75 mg by mouth See admin instructions. Take 75 mg by mouth daily on Saturday, Sunday, Tuesday and Thursday) 30 tablet 2  . furosemide (LASIX) 40 MG tablet Take 40 mg by mouth See admin instructions. Take 40 mg by mouth  twice daily on Tuesday, Thursday,  Saturday and Sunday.    . hydrALAZINE (APRESOLINE) 100 MG tablet Take 100 mg by mouth 3 (three) times daily.    . insulin glargine (LANTUS) 100 UNIT/ML injection Inject 9-10 Units into the skin daily.     . isosorbide mononitrate (IMDUR) 30 MG 24 hr tablet Take 1 tablet (30 mg total) by mouth daily. (Patient taking differently: Take 30 mg by mouth at bedtime. ) 30 tablet 0  . naproxen sodium (ANAPROX) 220 MG tablet Take 220 mg by mouth 2 (two) times daily as needed (for pain or headache).      No current facility-administered medications on file prior to visit.     There are no Patient Instructions on file for this visit. No follow-ups on file.   Kris Hartmann, NP  This note was completed with Sales executive.  Any errors are purely unintentional.

## 2019-10-25 DIAGNOSIS — N2581 Secondary hyperparathyroidism of renal origin: Secondary | ICD-10-CM | POA: Diagnosis not present

## 2019-10-25 DIAGNOSIS — D631 Anemia in chronic kidney disease: Secondary | ICD-10-CM | POA: Diagnosis not present

## 2019-10-25 DIAGNOSIS — Z992 Dependence on renal dialysis: Secondary | ICD-10-CM | POA: Diagnosis not present

## 2019-10-25 DIAGNOSIS — N186 End stage renal disease: Secondary | ICD-10-CM | POA: Diagnosis not present

## 2019-10-25 DIAGNOSIS — D509 Iron deficiency anemia, unspecified: Secondary | ICD-10-CM | POA: Diagnosis not present

## 2019-10-27 DIAGNOSIS — N2581 Secondary hyperparathyroidism of renal origin: Secondary | ICD-10-CM | POA: Diagnosis not present

## 2019-10-27 DIAGNOSIS — N186 End stage renal disease: Secondary | ICD-10-CM | POA: Diagnosis not present

## 2019-10-27 DIAGNOSIS — Z992 Dependence on renal dialysis: Secondary | ICD-10-CM | POA: Diagnosis not present

## 2019-10-27 DIAGNOSIS — D631 Anemia in chronic kidney disease: Secondary | ICD-10-CM | POA: Diagnosis not present

## 2019-10-27 DIAGNOSIS — D509 Iron deficiency anemia, unspecified: Secondary | ICD-10-CM | POA: Diagnosis not present

## 2019-10-30 DIAGNOSIS — N186 End stage renal disease: Secondary | ICD-10-CM | POA: Diagnosis not present

## 2019-10-30 DIAGNOSIS — D509 Iron deficiency anemia, unspecified: Secondary | ICD-10-CM | POA: Diagnosis not present

## 2019-10-30 DIAGNOSIS — N2581 Secondary hyperparathyroidism of renal origin: Secondary | ICD-10-CM | POA: Diagnosis not present

## 2019-10-30 DIAGNOSIS — Z992 Dependence on renal dialysis: Secondary | ICD-10-CM | POA: Diagnosis not present

## 2019-10-30 DIAGNOSIS — D631 Anemia in chronic kidney disease: Secondary | ICD-10-CM | POA: Diagnosis not present

## 2019-11-01 DIAGNOSIS — N2581 Secondary hyperparathyroidism of renal origin: Secondary | ICD-10-CM | POA: Diagnosis not present

## 2019-11-01 DIAGNOSIS — D509 Iron deficiency anemia, unspecified: Secondary | ICD-10-CM | POA: Diagnosis not present

## 2019-11-01 DIAGNOSIS — D631 Anemia in chronic kidney disease: Secondary | ICD-10-CM | POA: Diagnosis not present

## 2019-11-01 DIAGNOSIS — Z992 Dependence on renal dialysis: Secondary | ICD-10-CM | POA: Diagnosis not present

## 2019-11-01 DIAGNOSIS — N186 End stage renal disease: Secondary | ICD-10-CM | POA: Diagnosis not present

## 2019-11-03 DIAGNOSIS — D509 Iron deficiency anemia, unspecified: Secondary | ICD-10-CM | POA: Diagnosis not present

## 2019-11-03 DIAGNOSIS — Z992 Dependence on renal dialysis: Secondary | ICD-10-CM | POA: Diagnosis not present

## 2019-11-03 DIAGNOSIS — N186 End stage renal disease: Secondary | ICD-10-CM | POA: Diagnosis not present

## 2019-11-03 DIAGNOSIS — N2581 Secondary hyperparathyroidism of renal origin: Secondary | ICD-10-CM | POA: Diagnosis not present

## 2019-11-03 DIAGNOSIS — D631 Anemia in chronic kidney disease: Secondary | ICD-10-CM | POA: Diagnosis not present

## 2019-11-06 DIAGNOSIS — N186 End stage renal disease: Secondary | ICD-10-CM | POA: Diagnosis not present

## 2019-11-06 DIAGNOSIS — D509 Iron deficiency anemia, unspecified: Secondary | ICD-10-CM | POA: Diagnosis not present

## 2019-11-06 DIAGNOSIS — D631 Anemia in chronic kidney disease: Secondary | ICD-10-CM | POA: Diagnosis not present

## 2019-11-06 DIAGNOSIS — Z992 Dependence on renal dialysis: Secondary | ICD-10-CM | POA: Diagnosis not present

## 2019-11-06 DIAGNOSIS — N2581 Secondary hyperparathyroidism of renal origin: Secondary | ICD-10-CM | POA: Diagnosis not present

## 2019-11-07 ENCOUNTER — Telehealth (INDEPENDENT_AMBULATORY_CARE_PROVIDER_SITE_OTHER): Payer: Self-pay

## 2019-11-07 ENCOUNTER — Encounter (INDEPENDENT_AMBULATORY_CARE_PROVIDER_SITE_OTHER): Payer: Self-pay

## 2019-11-07 NOTE — Telephone Encounter (Signed)
I spoke with the patient earlier to decide on a day for his surgery. The patient is scheduled for a right AVF with Dr. Lucky Cowboy on 11/23/2019. Patient will do his pre-op on 11/10/2019 @ 12:45 pm and his Covid testing is on 11/20/2019 between 12:30-2:30 pm at the [place]. I attempted to contact the patient again to go over his instructions but a message was left for a return call. Pre-surgical instructions will be mailed to the patient.

## 2019-11-08 DIAGNOSIS — N2581 Secondary hyperparathyroidism of renal origin: Secondary | ICD-10-CM | POA: Diagnosis not present

## 2019-11-08 DIAGNOSIS — D631 Anemia in chronic kidney disease: Secondary | ICD-10-CM | POA: Diagnosis not present

## 2019-11-08 DIAGNOSIS — Z992 Dependence on renal dialysis: Secondary | ICD-10-CM | POA: Diagnosis not present

## 2019-11-08 DIAGNOSIS — D509 Iron deficiency anemia, unspecified: Secondary | ICD-10-CM | POA: Diagnosis not present

## 2019-11-08 DIAGNOSIS — N186 End stage renal disease: Secondary | ICD-10-CM | POA: Diagnosis not present

## 2019-11-09 ENCOUNTER — Other Ambulatory Visit (INDEPENDENT_AMBULATORY_CARE_PROVIDER_SITE_OTHER): Payer: Self-pay | Admitting: Nurse Practitioner

## 2019-11-10 ENCOUNTER — Inpatient Hospital Stay: Admission: RE | Admit: 2019-11-10 | Payer: Medicare Other | Source: Ambulatory Visit

## 2019-11-10 DIAGNOSIS — D631 Anemia in chronic kidney disease: Secondary | ICD-10-CM | POA: Diagnosis not present

## 2019-11-10 DIAGNOSIS — D509 Iron deficiency anemia, unspecified: Secondary | ICD-10-CM | POA: Diagnosis not present

## 2019-11-10 DIAGNOSIS — Z992 Dependence on renal dialysis: Secondary | ICD-10-CM | POA: Diagnosis not present

## 2019-11-10 DIAGNOSIS — N2581 Secondary hyperparathyroidism of renal origin: Secondary | ICD-10-CM | POA: Diagnosis not present

## 2019-11-10 DIAGNOSIS — N186 End stage renal disease: Secondary | ICD-10-CM | POA: Diagnosis not present

## 2019-11-13 DIAGNOSIS — N2581 Secondary hyperparathyroidism of renal origin: Secondary | ICD-10-CM | POA: Diagnosis not present

## 2019-11-13 DIAGNOSIS — D509 Iron deficiency anemia, unspecified: Secondary | ICD-10-CM | POA: Diagnosis not present

## 2019-11-13 DIAGNOSIS — Z992 Dependence on renal dialysis: Secondary | ICD-10-CM | POA: Diagnosis not present

## 2019-11-13 DIAGNOSIS — D631 Anemia in chronic kidney disease: Secondary | ICD-10-CM | POA: Diagnosis not present

## 2019-11-13 DIAGNOSIS — N186 End stage renal disease: Secondary | ICD-10-CM | POA: Diagnosis not present

## 2019-11-15 DIAGNOSIS — D631 Anemia in chronic kidney disease: Secondary | ICD-10-CM | POA: Diagnosis not present

## 2019-11-15 DIAGNOSIS — D509 Iron deficiency anemia, unspecified: Secondary | ICD-10-CM | POA: Diagnosis not present

## 2019-11-15 DIAGNOSIS — N186 End stage renal disease: Secondary | ICD-10-CM | POA: Diagnosis not present

## 2019-11-15 DIAGNOSIS — Z992 Dependence on renal dialysis: Secondary | ICD-10-CM | POA: Diagnosis not present

## 2019-11-15 DIAGNOSIS — N2581 Secondary hyperparathyroidism of renal origin: Secondary | ICD-10-CM | POA: Diagnosis not present

## 2019-11-17 DIAGNOSIS — N2581 Secondary hyperparathyroidism of renal origin: Secondary | ICD-10-CM | POA: Diagnosis not present

## 2019-11-17 DIAGNOSIS — D631 Anemia in chronic kidney disease: Secondary | ICD-10-CM | POA: Diagnosis not present

## 2019-11-17 DIAGNOSIS — Z992 Dependence on renal dialysis: Secondary | ICD-10-CM | POA: Diagnosis not present

## 2019-11-17 DIAGNOSIS — N186 End stage renal disease: Secondary | ICD-10-CM | POA: Diagnosis not present

## 2019-11-17 DIAGNOSIS — D509 Iron deficiency anemia, unspecified: Secondary | ICD-10-CM | POA: Diagnosis not present

## 2019-11-20 ENCOUNTER — Other Ambulatory Visit: Payer: Self-pay

## 2019-11-20 ENCOUNTER — Encounter
Admission: RE | Admit: 2019-11-20 | Discharge: 2019-11-20 | Disposition: A | Discharge status: Home / Self Care | Payer: Medicare Other | Source: Ambulatory Visit | Attending: Vascular Surgery | Admitting: Vascular Surgery

## 2019-11-20 ENCOUNTER — Other Ambulatory Visit: Admission: RE | Admit: 2019-11-20 | Payer: Medicare Other | Source: Ambulatory Visit

## 2019-11-20 DIAGNOSIS — Z20828 Contact with and (suspected) exposure to other viral communicable diseases: Secondary | ICD-10-CM | POA: Insufficient documentation

## 2019-11-20 DIAGNOSIS — D631 Anemia in chronic kidney disease: Secondary | ICD-10-CM | POA: Diagnosis not present

## 2019-11-20 DIAGNOSIS — Z992 Dependence on renal dialysis: Secondary | ICD-10-CM | POA: Diagnosis not present

## 2019-11-20 DIAGNOSIS — Z01812 Encounter for preprocedural laboratory examination: Secondary | ICD-10-CM | POA: Insufficient documentation

## 2019-11-20 DIAGNOSIS — Z01810 Encounter for preprocedural cardiovascular examination: Secondary | ICD-10-CM | POA: Insufficient documentation

## 2019-11-20 DIAGNOSIS — N2581 Secondary hyperparathyroidism of renal origin: Secondary | ICD-10-CM | POA: Diagnosis not present

## 2019-11-20 DIAGNOSIS — N186 End stage renal disease: Secondary | ICD-10-CM | POA: Diagnosis not present

## 2019-11-20 DIAGNOSIS — D509 Iron deficiency anemia, unspecified: Secondary | ICD-10-CM | POA: Diagnosis not present

## 2019-11-20 LAB — CBC WITH DIFFERENTIAL/PLATELET
Abs Immature Granulocytes: 0.02 10*3/uL (ref 0.00–0.07)
Basophils Absolute: 0.1 10*3/uL (ref 0.0–0.1)
Basophils Relative: 1 %
Eosinophils Absolute: 0.1 10*3/uL (ref 0.0–0.5)
Eosinophils Relative: 2 %
HCT: 41.3 % (ref 39.0–52.0)
Hemoglobin: 13.3 g/dL (ref 13.0–17.0)
Immature Granulocytes: 0 %
Lymphocytes Relative: 21 %
Lymphs Abs: 1.3 10*3/uL (ref 0.7–4.0)
MCH: 29.6 pg (ref 26.0–34.0)
MCHC: 32.2 g/dL (ref 30.0–36.0)
MCV: 91.8 fL (ref 80.0–100.0)
Monocytes Absolute: 0.6 10*3/uL (ref 0.1–1.0)
Monocytes Relative: 10 %
Neutro Abs: 4 10*3/uL (ref 1.7–7.7)
Neutrophils Relative %: 66 %
Platelets: 166 10*3/uL (ref 150–400)
RBC: 4.5 MIL/uL (ref 4.22–5.81)
RDW: 13.3 % (ref 11.5–15.5)
WBC: 6.1 10*3/uL (ref 4.0–10.5)
nRBC: 0 % (ref 0.0–0.2)

## 2019-11-20 LAB — TYPE AND SCREEN
ABO/RH(D): O POS
Antibody Screen: NEGATIVE

## 2019-11-20 LAB — BASIC METABOLIC PANEL
Anion gap: 12 (ref 5–15)
BUN: 38 mg/dL — ABNORMAL HIGH (ref 6–20)
CO2: 26 mmol/L (ref 22–32)
Calcium: 8.4 mg/dL — ABNORMAL LOW (ref 8.9–10.3)
Chloride: 99 mmol/L (ref 98–111)
Creatinine, Ser: 5.32 mg/dL — ABNORMAL HIGH (ref 0.61–1.24)
GFR calc Af Amer: 13 mL/min — ABNORMAL LOW (ref >60)
GFR calc non Af Amer: 11 mL/min — ABNORMAL LOW (ref >60)
Glucose, Bld: 134 mg/dL — ABNORMAL HIGH (ref 70–99)
Potassium: 3.8 mmol/L (ref 3.5–5.1)
Sodium: 137 mmol/L (ref 135–145)

## 2019-11-20 LAB — PROTIME-INR
INR: 1.2 (ref 0.8–1.2)
Prothrombin Time: 15 seconds (ref 11.4–15.2)

## 2019-11-20 LAB — APTT: aPTT: 36 seconds (ref 24–36)

## 2019-11-20 NOTE — Patient Instructions (Addendum)
Your procedure is scheduled on: Thursday 11/23/19 Report to [place]. To find out your arrival time please call [phone] between 1PM - 3PM on Wednesday 11/22/19.  Remember: Instructions that are not followed completely may result in serious medical risk, up to and including death, or upon the discretion of your surgeon and anesthesiologist your surgery may need to be rescheduled.     _X__ 1. Do not eat food after midnight the night before your procedure.                 No gum chewing or hard candies. You may drink clear liquids up to 2 hours                 before you are scheduled to arrive for your surgery- DO not drink clear                 liquids within 2 hours of the start of your surgery.                 Clear Liquids include:  water, apple juice without pulp, clear carbohydrate                 drink such as Clearfast or Gatorade, Black Coffee or Tea (Do not add                 anything to coffee or tea). Diabetics water only  __X__2.  On the morning of surgery brush your teeth with toothpaste and water, you                 may rinse your mouth with mouthwash if you wish.  Do not swallow any              toothpaste of mouthwash.     _X__ 3.  No Alcohol for 24 hours before or after surgery.   _X__ 4.  Do Not Smoke or use e-cigarettes For 24 Hours Prior to Your Surgery.                 Do not use any chewable tobacco products for at least 6 hours prior to                 surgery.  ____  5.  Bring all medications with you on the day of surgery if instructed.   __X__  6.  Notify your doctor if there is any change in your medical condition      (cold, fever, infections).     Do not wear jewelry, make-up, hairpins, clips or nail polish. Do not wear lotions, powders, or perfumes.  Do not shave 48 hours prior to surgery. Men may shave face and neck. Do not bring valuables to the hospital.    [facility] is not responsible  for any belongings or valuables.  Contacts, dentures/partials or body piercings may not be worn into surgery. Bring a case for your contacts, glasses or hearing aids, a denture cup will be supplied. Leave your suitcase in the car. After surgery it may be brought to your room. For patients admitted to the hospital, discharge time is determined by your treatment team.   Patients discharged the day of surgery will not be allowed to drive home.   Please read over the following fact sheets that you were given:   MRSA Information  __X__ Take these medicines the morning of surgery with A SIP OF WATER:  1. carvedilol (COREG  2. hydrALAZINE (APRESOLINE  3.   4.  5.  6.  ____ Fleet Enema (as directed)   __X__ Use CHG Soap/SAGE wipes as directed  ____ Use inhalers on the day of surgery  ____ Stop metformin/Janumet/Farxiga 2 days prior to surgery    ____ Take 1/2 of usual insulin dose the night before surgery. No insulin the morning          of surgery.   __X__ Stop Blood Thinners Coumadin/Plavix/Xarelto/Pleta/Pradaxa/Eliquis/Effient/Aspirin  on   Or contact your Surgeon, Cardiologist or Medical Doctor regarding  ability to stop your blood thinners  STOP PLAVIX 5 DAYS PRIOR TO PROCEDURE UNLESS OTHERWISE INSTRUCTED.  __X__ Stop Anti-inflammatories 7 days before surgery such as Advil, Ibuprofen, Motrin,  BC or Goodies Powder, Naprosyn, Naproxen, Aleve, Aspirin    __X__ Stop all herbal supplements, fish oil or vitamin E until after surgery.    ____ Bring C-Pap to the hospital.

## 2019-11-20 NOTE — Pre-Procedure Instructions (Signed)
Septal infarct noted on previous EKG from 03/08/18.

## 2019-11-21 LAB — SARS CORONAVIRUS 2 (TAT 6-24 HRS): SARS Coronavirus 2: NEGATIVE

## 2019-11-23 ENCOUNTER — Ambulatory Visit: Payer: Medicare Other | Admitting: Certified Registered Nurse Anesthetist

## 2019-11-23 ENCOUNTER — Other Ambulatory Visit: Payer: Self-pay

## 2019-11-23 ENCOUNTER — Ambulatory Visit
Admission: RE | Admit: 2019-11-23 | Discharge: 2019-11-23 | Disposition: A | Discharge status: Home / Self Care | Payer: Medicare Other | Attending: Vascular Surgery | Admitting: Vascular Surgery

## 2019-11-23 ENCOUNTER — Encounter
Admission: RE | Disposition: A | Discharge status: Home / Self Care | Payer: Self-pay | Source: Home / Self Care | Attending: Vascular Surgery

## 2019-11-23 DIAGNOSIS — E785 Hyperlipidemia, unspecified: Secondary | ICD-10-CM | POA: Diagnosis not present

## 2019-11-23 DIAGNOSIS — E1140 Type 2 diabetes mellitus with diabetic neuropathy, unspecified: Secondary | ICD-10-CM | POA: Insufficient documentation

## 2019-11-23 DIAGNOSIS — Z87891 Personal history of nicotine dependence: Secondary | ICD-10-CM | POA: Diagnosis not present

## 2019-11-23 DIAGNOSIS — Z794 Long term (current) use of insulin: Secondary | ICD-10-CM | POA: Diagnosis not present

## 2019-11-23 DIAGNOSIS — I2510 Atherosclerotic heart disease of native coronary artery without angina pectoris: Secondary | ICD-10-CM | POA: Insufficient documentation

## 2019-11-23 DIAGNOSIS — Z7902 Long term (current) use of antithrombotics/antiplatelets: Secondary | ICD-10-CM | POA: Diagnosis not present

## 2019-11-23 DIAGNOSIS — N185 Chronic kidney disease, stage 5: Secondary | ICD-10-CM

## 2019-11-23 DIAGNOSIS — Z79899 Other long term (current) drug therapy: Secondary | ICD-10-CM | POA: Diagnosis not present

## 2019-11-23 DIAGNOSIS — E1122 Type 2 diabetes mellitus with diabetic chronic kidney disease: Secondary | ICD-10-CM | POA: Diagnosis not present

## 2019-11-23 DIAGNOSIS — Y841 Kidney dialysis as the cause of abnormal reaction of the patient, or of later complication, without mention of misadventure at the time of the procedure: Secondary | ICD-10-CM | POA: Insufficient documentation

## 2019-11-23 DIAGNOSIS — E782 Mixed hyperlipidemia: Secondary | ICD-10-CM | POA: Insufficient documentation

## 2019-11-23 DIAGNOSIS — Z8673 Personal history of transient ischemic attack (TIA), and cerebral infarction without residual deficits: Secondary | ICD-10-CM | POA: Diagnosis not present

## 2019-11-23 DIAGNOSIS — I132 Hypertensive heart and chronic kidney disease with heart failure and with stage 5 chronic kidney disease, or end stage renal disease: Secondary | ICD-10-CM | POA: Diagnosis not present

## 2019-11-23 DIAGNOSIS — I509 Heart failure, unspecified: Secondary | ICD-10-CM | POA: Insufficient documentation

## 2019-11-23 DIAGNOSIS — T82510A Breakdown (mechanical) of surgically created arteriovenous fistula, initial encounter: Secondary | ICD-10-CM | POA: Insufficient documentation

## 2019-11-23 DIAGNOSIS — Z992 Dependence on renal dialysis: Secondary | ICD-10-CM | POA: Insufficient documentation

## 2019-11-23 DIAGNOSIS — N186 End stage renal disease: Secondary | ICD-10-CM | POA: Insufficient documentation

## 2019-11-23 HISTORY — PX: AV FISTULA PLACEMENT: SHX1204

## 2019-11-23 LAB — POCT I-STAT, CHEM 8
BUN: 46 mg/dL — ABNORMAL HIGH (ref 6–20)
Calcium, Ion: 1.12 mmol/L — ABNORMAL LOW (ref 1.15–1.40)
Chloride: 99 mmol/L (ref 98–111)
Creatinine, Ser: 7.4 mg/dL — ABNORMAL HIGH (ref 0.61–1.24)
Glucose, Bld: 95 mg/dL (ref 70–99)
HCT: 43 % (ref 39.0–52.0)
Hemoglobin: 14.6 g/dL (ref 13.0–17.0)
Potassium: 4.1 mmol/L (ref 3.5–5.1)
Sodium: 138 mmol/L (ref 135–145)
TCO2: 26 mmol/L (ref 22–32)

## 2019-11-23 LAB — GLUCOSE, CAPILLARY: Glucose-Capillary: 92 mg/dL (ref 70–99)

## 2019-11-23 SURGERY — ARTERIOVENOUS (AV) FISTULA CREATION
Anesthesia: General | Laterality: Right

## 2019-11-23 MED ORDER — ONDANSETRON HCL 4 MG/2ML IJ SOLN
INTRAMUSCULAR | Status: DC | PRN
  Administered 2019-11-23: 4 mg via INTRAVENOUS

## 2019-11-23 MED ORDER — PROPOFOL 10 MG/ML IV BOLUS
INTRAVENOUS | Status: DC | PRN
  Administered 2019-11-23: 50 mg via INTRAVENOUS

## 2019-11-23 MED ORDER — PROPOFOL 10 MG/ML IV BOLUS
INTRAVENOUS | Status: AC
  Filled 2019-11-23: qty 20

## 2019-11-23 MED ORDER — FAMOTIDINE 20 MG PO TABS
ORAL_TABLET | ORAL | Status: AC
  Administered 2019-11-23: 20 mg via ORAL
  Filled 2019-11-23: qty 1

## 2019-11-23 MED ORDER — FAMOTIDINE 20 MG PO TABS
20.0000 mg | ORAL_TABLET | Freq: Once | ORAL | Status: AC
  Administered 2019-11-23: 14:00:00 20 mg via ORAL

## 2019-11-23 MED ORDER — ACETAMINOPHEN 325 MG PO TABS: 325.0000 mg | ORAL_TABLET | ORAL | Status: DC | PRN

## 2019-11-23 MED ORDER — FENTANYL CITRATE (PF) 100 MCG/2ML IJ SOLN
INTRAMUSCULAR | Status: DC | PRN
  Administered 2019-11-23 (×2): 50 ug via INTRAVENOUS

## 2019-11-23 MED ORDER — LIDOCAINE HCL (CARDIAC) PF 100 MG/5ML IV SOSY
PREFILLED_SYRINGE | INTRAVENOUS | Status: DC | PRN
  Administered 2019-11-23: 100 mg via INTRAVENOUS

## 2019-11-23 MED ORDER — BUPIVACAINE HCL (PF) 0.5 % IJ SOLN
INTRAMUSCULAR | Status: AC
  Filled 2019-11-23: qty 30

## 2019-11-23 MED ORDER — HEPARIN SODIUM (PORCINE) 5000 UNIT/ML IJ SOLN
INTRAMUSCULAR | Status: AC
  Filled 2019-11-23: qty 1

## 2019-11-23 MED ORDER — FENTANYL CITRATE (PF) 100 MCG/2ML IJ SOLN: 25.0000 ug | INTRAMUSCULAR | Status: DC | PRN

## 2019-11-23 MED ORDER — EPINEPHRINE PF 1 MG/ML IJ SOLN
INTRAMUSCULAR | Status: AC
  Filled 2019-11-23: qty 1

## 2019-11-23 MED ORDER — SODIUM CHLORIDE 0.9 % IV SOLN
INTRAVENOUS | Status: DC | PRN
  Administered 2019-11-23: 30 ug/min via INTRAVENOUS

## 2019-11-23 MED ORDER — DEXAMETHASONE SODIUM PHOSPHATE 10 MG/ML IJ SOLN
INTRAMUSCULAR | Status: DC | PRN
  Administered 2019-11-23: 10 mg via INTRAVENOUS

## 2019-11-23 MED ORDER — ACETAMINOPHEN 160 MG/5ML PO SOLN
325.0000 mg | ORAL | Status: DC | PRN
  Filled 2019-11-23: qty 20.3

## 2019-11-23 MED ORDER — PAPAVERINE HCL 30 MG/ML IJ SOLN
INTRAMUSCULAR | Status: AC
  Filled 2019-11-23: qty 2

## 2019-11-23 MED ORDER — SODIUM CHLORIDE 0.9 % IV SOLN
INTRAVENOUS | Status: DC
  Administered 2019-11-23: 14:00:00 via INTRAVENOUS

## 2019-11-23 MED ORDER — CHLORHEXIDINE GLUCONATE CLOTH 2 % EX PADS: 6.0000 | MEDICATED_PAD | Freq: Once | CUTANEOUS | Status: DC

## 2019-11-23 MED ORDER — FENTANYL CITRATE (PF) 100 MCG/2ML IJ SOLN
INTRAMUSCULAR | Status: AC
  Filled 2019-11-23: qty 2

## 2019-11-23 MED ORDER — CLINDAMYCIN PHOSPHATE 300 MG/50ML IV SOLN
INTRAVENOUS | Status: AC
  Filled 2019-11-23: qty 50

## 2019-11-23 MED ORDER — HEPARIN SODIUM (PORCINE) 1000 UNIT/ML IJ SOLN
INTRAMUSCULAR | Status: DC | PRN
  Administered 2019-11-23: 3000 [IU] via INTRAVENOUS

## 2019-11-23 MED ORDER — HYDROCODONE-ACETAMINOPHEN 5-325 MG PO TABS: 1.0000 | ORAL_TABLET | Freq: Four times a day (QID) | ORAL | 0 refills | Status: DC | PRN

## 2019-11-23 MED ORDER — PROMETHAZINE HCL 25 MG/ML IJ SOLN: 6.2500 mg | INTRAMUSCULAR | Status: DC | PRN

## 2019-11-23 MED ORDER — CLINDAMYCIN PHOSPHATE 300 MG/50ML IV SOLN
300.0000 mg | INTRAVENOUS | Status: AC
  Administered 2019-11-23: 300 mg via INTRAVENOUS

## 2019-11-23 MED ORDER — MIDAZOLAM HCL 2 MG/2ML IJ SOLN
INTRAMUSCULAR | Status: DC | PRN
  Administered 2019-11-23: 2 mg via INTRAVENOUS

## 2019-11-23 MED ORDER — MIDAZOLAM HCL 2 MG/2ML IJ SOLN
INTRAMUSCULAR | Status: AC
  Filled 2019-11-23: qty 2

## 2019-11-23 SURGICAL SUPPLY — 50 items
BAG DECANTER FOR FLEXI CONT (MISCELLANEOUS) ×3 IMPLANT
BLADE SURG SZ11 CARB STEEL (BLADE) ×3 IMPLANT
BOOT SUTURE AID YELLOW STND (SUTURE) ×3 IMPLANT
BRUSH SCRUB EZ  4% CHG (MISCELLANEOUS) ×2
BRUSH SCRUB EZ 4% CHG (MISCELLANEOUS) ×1 IMPLANT
CANISTER SUCT 1200ML W/VALVE (MISCELLANEOUS) ×3 IMPLANT
CHLORAPREP W/TINT 26 (MISCELLANEOUS) ×3 IMPLANT
CLIP SPRNG 6MM S-JAW DBL (CLIP) ×3
COVER WAND RF STERILE (DRAPES) ×3 IMPLANT
DERMABOND ADVANCED (GAUZE/BANDAGES/DRESSINGS) ×2
DERMABOND ADVANCED .7 DNX12 (GAUZE/BANDAGES/DRESSINGS) ×1 IMPLANT
ELECT CAUTERY BLADE 6.4 (BLADE) ×3 IMPLANT
ELECT REM PT RETURN 9FT ADLT (ELECTROSURGICAL) ×3
ELECTRODE REM PT RTRN 9FT ADLT (ELECTROSURGICAL) ×1 IMPLANT
GEL ULTRASOUND 20GR AQUASONIC (MISCELLANEOUS) IMPLANT
GLOVE BIO SURGEON STRL SZ7 (GLOVE) ×6 IMPLANT
GLOVE INDICATOR 7.5 STRL GRN (GLOVE) ×3 IMPLANT
GOWN STRL REUS W/ TWL LRG LVL3 (GOWN DISPOSABLE) ×2 IMPLANT
GOWN STRL REUS W/ TWL XL LVL3 (GOWN DISPOSABLE) ×1 IMPLANT
GOWN STRL REUS W/TWL LRG LVL3 (GOWN DISPOSABLE) ×4
GOWN STRL REUS W/TWL XL LVL3 (GOWN DISPOSABLE) ×2
HEMOSTAT SURGICEL 2X3 (HEMOSTASIS) ×3 IMPLANT
IV NS 500ML (IV SOLUTION) ×2
IV NS 500ML BAXH (IV SOLUTION) ×1 IMPLANT
KIT TURNOVER KIT A (KITS) ×3 IMPLANT
LABEL OR SOLS (LABEL) ×3 IMPLANT
LOOP RED MAXI  1X406MM (MISCELLANEOUS) ×2
LOOP VESSEL MAXI 1X406 RED (MISCELLANEOUS) ×1 IMPLANT
LOOP VESSEL MINI 0.8X406 BLUE (MISCELLANEOUS) ×1 IMPLANT
LOOPS BLUE MINI 0.8X406MM (MISCELLANEOUS) ×2
NEEDLE FILTER BLUNT 18X 1/2SAF (NEEDLE) ×2
NEEDLE FILTER BLUNT 18X1 1/2 (NEEDLE) ×1 IMPLANT
NS IRRIG 500ML POUR BTL (IV SOLUTION) ×3 IMPLANT
PACK EXTREMITY ARMC (MISCELLANEOUS) ×3 IMPLANT
PAD PREP 24X41 OB/GYN DISP (PERSONAL CARE ITEMS) ×3 IMPLANT
SOLUTION CELL SAVER (CLIP) ×1 IMPLANT
STOCKINETTE STRL 4IN 9604848 (GAUZE/BANDAGES/DRESSINGS) ×3 IMPLANT
SUT MNCRL AB 4-0 PS2 18 (SUTURE) ×3 IMPLANT
SUT PROLENE 6 0 BV (SUTURE) ×12 IMPLANT
SUT SILK 2 0 (SUTURE) ×2
SUT SILK 2-0 18XBRD TIE 12 (SUTURE) ×1 IMPLANT
SUT SILK 3 0 (SUTURE) ×2
SUT SILK 3-0 18XBRD TIE 12 (SUTURE) ×1 IMPLANT
SUT SILK 4 0 (SUTURE) ×2
SUT SILK 4-0 18XBRD TIE 12 (SUTURE) ×1 IMPLANT
SUT VIC AB 3-0 SH 27 (SUTURE) ×2
SUT VIC AB 3-0 SH 27X BRD (SUTURE) ×1 IMPLANT
SYR 20ML LL LF (SYRINGE) ×3 IMPLANT
SYR 3ML LL SCALE MARK (SYRINGE) ×3 IMPLANT
SYR TB 1ML 27GX1/2 LL (SYRINGE) IMPLANT

## 2019-11-23 NOTE — Progress Notes (Signed)
Spoke with Dr Lucky Cowboy regarding IV placement. Per Dr Lucky Cowboy place IV in left hand or in the foot.

## 2019-11-23 NOTE — Transfer of Care (Signed)
Anesthesia Post Note  Patient: Richard Zavala  Procedure(s) Performed: Procedure(s) (LRB): ARTERIOVENOUS (AV) FISTULA CREATION (RADIOCEPHALIC ) (Right)  Anesthesia type: General  Patient location: ICU  Post pain: Pain level controlled  Post assessment: Post-op Vital signs reviewed  Last Vitals:  Vitals:   11/23/19 1359 11/23/19 1659  BP: 122/63 (!) 121/52  Pulse: 63 (!) 59  Resp: 16 10  Temp: (!) 36.2 C 36.4 C  SpO2: 98% 100%    Post vital signs: stable  Level of consciousness: Patient remains intubated per anesthesia plan  Complications: No apparent anesthesia complications

## 2019-11-23 NOTE — Anesthesia Postprocedure Evaluation (Signed)
Anesthesia Post Note  Patient: Richard Zavala  Procedure(s) Performed: ARTERIOVENOUS (AV) FISTULA CREATION (RADIOCEPHALIC ) (Right )  Patient location during evaluation: PACU Anesthesia Type: General Level of consciousness: awake and alert Pain management: pain level controlled Vital Signs Assessment: post-procedure vital signs reviewed and stable Respiratory status: spontaneous breathing, nonlabored ventilation, respiratory function stable and patient connected to nasal cannula oxygen Cardiovascular status: blood pressure returned to baseline and stable Postop Assessment: no apparent nausea or vomiting Anesthetic complications: no     Last Vitals:  Vitals:   11/23/19 1740 11/23/19 1751  BP: 101/79 125/72  Pulse:  61  Resp: 16 16  Temp: 36.5 C 36.4 C  SpO2:  95%    Last Pain:  Vitals:   11/23/19 1751  TempSrc: Temporal  PainSc: [place]

## 2019-11-23 NOTE — H&P (Signed)
[facility] History & Physical Update  The patient was interviewed and re-examined.  The patient's previous History and Physical has been reviewed and is unchanged.  There is no change in the plan of care. We plan to proceed with the scheduled procedure.  [name], MD  11/23/2019, 2:11 PM

## 2019-11-23 NOTE — Discharge Instructions (Signed)

## 2019-11-23 NOTE — Anesthesia Post-op Follow-up Note (Signed)
Anesthesia QCDR form completed.        

## 2019-11-23 NOTE — Op Note (Signed)
[facility]   OPERATIVE NOTE   PROCEDURE: Right radiocephaic arteriovenous fistula placement  PRE-OPERATIVE DIAGNOSIS: 1. ESRD 2. Failing, aneurysmal left arm AVF  POST-OPERATIVE DIAGNOSIS: Same  SURGEON: [name], MD  ASSISTANT(S): Hezzie Bump, PA-C  ANESTHESIA: general  ESTIMATED BLOOD LOSS: 25 cc  FINDING(S): Calcified radial artery, adequate forearm cephalic vein  SPECIMEN(S):  None  INDICATIONS:   Richard Zavala is a 58 y.o. male who presents with renal failure and a failing left arm AVF.  The patient is scheduled for right radiocephalic arteriovenous fistula placement when non-invasive studies suggested adequate anatomy for fistula creation at this location.  The patient is aware the risks include but are not limited to: bleeding, infection, steal syndrome, nerve damage, ischemic monomelic neuropathy, failure to mature, and need for additional procedures.  The patient is aware of the risks of the procedure and elects to proceed forward.  DESCRIPTION: After full informed written consent was obtained from the patient, the patient was brought back to the operating room and placed supine upon the operating table.  Prior to induction, the patient received IV antibiotics.   After obtaining adequate anesthesia, the patient was then prepped and draped in the standard fashion for a right arm access procedure.  I turned my attention first to identifying the patient's distal cephalic vein and radial artery.  I made an incision at the level of the distal forearm and wrist and dissected through the subcutaneous tissue and fascia to gain exposure of the radial artery.  This was noted to be highly calcified but of good size and with good flow and therefore useable for fistula creation.  This was dissected out proximally and distally and controlled with vessel loops.  I then dissected out the cephalic vein.  This was noted to be patent and adequate size for fistula  creation. I then gave the patient 3000 units of intravenous heparin.  The distal segment of the vein was instilled with heparinized saline into the vein and clamped it.  At this point, I reset my exposure of the radial artery and placed the artery under tension proximally and distally.  I made an arteriotomy with a #11 blade, and then I extended the arteriotomy with a Potts scissor.  I injected heparinized saline proximal and distal to this arteriotomy.  The vein was then sewn to the artery in an end-to-side configuration with a running stitch of 6-0 Prolene.  Prior to completing this anastomosis, I allowed the vein and artery to backbleed.  There was no evidence of clot from any vessels.  I completed the anastomosis in the usual fashion and then released all vessel loops and clamps.  There was a palpable  thrill in the venous outflow, and there was a palpable radial pulse beyond the anastomosis.  At this point, I irrigated out the surgical wound. Surgicel was placed. There was no further active bleeding.  The subcutaneous tissue was reapproximated with a running stitch of 3-0 Vicryl.  The skin was then reapproximated with a running subcuticular stitch of 4-0 Monocryl.  The skin was then cleaned, dried, and reinforced with Dermabond.  The patient tolerated this procedure well and was taken to the recovery room in stable condition  COMPLICATIONS: None  CONDITION: Stable   [name], MD 11/23/2019 4:52 PM   This note was created with Dragon Medical transcription system. Any errors in dictation are purely unintentional.

## 2019-11-23 NOTE — Anesthesia Preprocedure Evaluation (Addendum)
Anesthesia Evaluation  Patient identified by MRN, date of birth, ID band Patient awake    Reviewed: Allergy & Precautions, H&P , NPO status , reviewed documented beta blocker date and time   Airway Mallampati: II  TM Distance: >3 FB Neck ROM: full    Dental  (+) Poor Dentition, Chipped, Missing   Pulmonary former smoker,    Pulmonary exam normal        Cardiovascular hypertension, + CAD, + Peripheral Vascular Disease and +CHF  Normal cardiovascular exam  2019 ECHO Study Conclusions  - Left ventricle: The cavity size was normal. There was moderate   concentric hypertrophy. Systolic function was normal. The   estimated ejection fraction was in the range of 60% to 65%. Wall   motion was normal; there were no regional wall motion   abnormalities. Features are consistent with a pseudonormal left   ventricular filling pattern, with concomitant abnormal relaxation   and increased filling pressure (grade 2 diastolic dysfunction). - Left atrium: The atrium was mildly dilated. - Right ventricle: Systolic function was normal. - Pulmonary arteries: Systolic pressure was within the normal   range. - Pericardium, extracardiac: A trivial pericardial effusion was   Identified.  Mod Pulm HTN   Neuro/Psych TIA Neuromuscular disease CVA, No Residual Symptoms    GI/Hepatic   Endo/Other  diabetes  Renal/GU DialysisRenal diseaseDialyzed yesterday     Musculoskeletal   Abdominal   Peds  Hematology  (+) Blood dyscrasia, anemia ,   Anesthesia Other Findings Past Medical History: No date: Anemia No date: Blind No date: BPH (benign prostatic hyperplasia) No date: CAD (coronary artery disease) No date: CHF (congestive heart failure) (HCC) No date: Chronic kidney disease (CKD), stage IV (severe) (HCC)     Comment:  DIALYSIS No date: Diabetes mellitus without complication (HCC) No date: ESRD (end stage renal disease) (HCC)  Comment:  Monday-Wednesday-Friday dialysis No date: Hyperlipemia No date: Hypertension No date: Neuropathy No date: Osteoporosis No date: Pulmonary HTN ([place]) No date: Stroke [facility])     Comment:  2013 No date: TIA (transient ischemic attack) No date: TRD (traction retinal detachment) No date: Wilms' tumor [facility])  Past Surgical History: 01/28/2017: A/V FISTULAGRAM; Left     Comment:  Procedure: A/V Fistulagram;  Surgeon: Algernon Huxley, MD;                Location: [place] [facility];  Service: Cardiovascular;              Laterality: Left; 04/28/2018: A/V FISTULAGRAM; Left     Comment:  Procedure: A/V FISTULAGRAM;  Surgeon: Algernon Huxley, MD;               Location: [place] [facility];  Service: Cardiovascular;              Laterality: Left; No date: AV FISTULA PLACEMENT No date: CARDIAC CATHETERIZATION 05/14/2016: CATARACT EXTRACTION W/PHACO; Right     Comment:  Procedure: CATARACT EXTRACTION PHACO AND INTRAOCULAR               LENS PLACEMENT (IOC);  Surgeon: Eulogio Bear, MD;                Location: [facility];  Service: Ophthalmology;  Laterality:              Right;  [place] 1.46 AP% 11.5 CDE 12.33 FLUID PACK LOT #               [id number] No date: EYE  SURGERY No date: FRACTURE SURGERY     Comment:  RIGHT LEG WITH ROD No date: HIP SURGERY No date: NEPHRECTOMY RADICAL 08/28/2015: PERIPHERAL VASCULAR CATHETERIZATION; N/A     Comment:  Procedure: A/V Shuntogram/Fistulagram;  Surgeon: Algernon Huxley, MD;  Location: [place] [facility];  Service:               Cardiovascular;  Laterality: N/A; 08/28/2015: PERIPHERAL VASCULAR CATHETERIZATION; Left     Comment:  Procedure: A/V Shunt Intervention;  Surgeon: Algernon Huxley, MD;  Location: [place] [facility];  Service:               Cardiovascular;  Laterality: Left;     Reproductive/Obstetrics                            Anesthesia Physical Anesthesia Plan  ASA: IV  Anesthesia Plan:  General   Post-op Pain Management:    Induction: Intravenous  PONV Risk Score and Plan: 2 and Treatment may vary due to age or medical condition, TIVA, Ondansetron and Midazolam  Airway Management Planned: Nasal Cannula and Natural Airway  Additional Equipment:   Intra-op Plan:   Post-operative Plan: Extubation in OR  Informed Consent: I have reviewed the patients History and Physical, chart, labs and discussed the procedure including the risks, benefits and alternatives for the proposed anesthesia with the patient or authorized representative who has indicated his/her understanding and acceptance.     Dental Advisory Given  Plan Discussed with: CRNA  Anesthesia Plan Comments: (Discussed Supracalvicular Block, pt declines)       Anesthesia Quick Evaluation

## 2019-11-24 ENCOUNTER — Encounter: Payer: Self-pay | Admitting: Vascular Surgery

## 2019-12-22 DIAGNOSIS — D509 Iron deficiency anemia, unspecified: Secondary | ICD-10-CM | POA: Diagnosis not present

## 2019-12-22 DIAGNOSIS — N2581 Secondary hyperparathyroidism of renal origin: Secondary | ICD-10-CM | POA: Diagnosis not present

## 2019-12-22 DIAGNOSIS — Z992 Dependence on renal dialysis: Secondary | ICD-10-CM | POA: Diagnosis not present

## 2019-12-22 DIAGNOSIS — N186 End stage renal disease: Secondary | ICD-10-CM | POA: Diagnosis not present

## 2019-12-22 DIAGNOSIS — D631 Anemia in chronic kidney disease: Secondary | ICD-10-CM | POA: Diagnosis not present

## 2019-12-25 DIAGNOSIS — N2581 Secondary hyperparathyroidism of renal origin: Secondary | ICD-10-CM | POA: Diagnosis not present

## 2019-12-25 DIAGNOSIS — N186 End stage renal disease: Secondary | ICD-10-CM | POA: Diagnosis not present

## 2019-12-25 DIAGNOSIS — D631 Anemia in chronic kidney disease: Secondary | ICD-10-CM | POA: Diagnosis not present

## 2019-12-25 DIAGNOSIS — Z992 Dependence on renal dialysis: Secondary | ICD-10-CM | POA: Diagnosis not present

## 2019-12-25 DIAGNOSIS — D509 Iron deficiency anemia, unspecified: Secondary | ICD-10-CM | POA: Diagnosis not present

## 2019-12-27 DIAGNOSIS — N2581 Secondary hyperparathyroidism of renal origin: Secondary | ICD-10-CM | POA: Diagnosis not present

## 2019-12-27 DIAGNOSIS — D631 Anemia in chronic kidney disease: Secondary | ICD-10-CM | POA: Diagnosis not present

## 2019-12-27 DIAGNOSIS — D509 Iron deficiency anemia, unspecified: Secondary | ICD-10-CM | POA: Diagnosis not present

## 2019-12-27 DIAGNOSIS — Z992 Dependence on renal dialysis: Secondary | ICD-10-CM | POA: Diagnosis not present

## 2019-12-27 DIAGNOSIS — N186 End stage renal disease: Secondary | ICD-10-CM | POA: Diagnosis not present

## 2019-12-29 DIAGNOSIS — Z992 Dependence on renal dialysis: Secondary | ICD-10-CM | POA: Diagnosis not present

## 2019-12-29 DIAGNOSIS — N186 End stage renal disease: Secondary | ICD-10-CM | POA: Diagnosis not present

## 2019-12-29 DIAGNOSIS — D631 Anemia in chronic kidney disease: Secondary | ICD-10-CM | POA: Diagnosis not present

## 2019-12-29 DIAGNOSIS — N2581 Secondary hyperparathyroidism of renal origin: Secondary | ICD-10-CM | POA: Diagnosis not present

## 2019-12-29 DIAGNOSIS — D509 Iron deficiency anemia, unspecified: Secondary | ICD-10-CM | POA: Diagnosis not present

## 2020-01-01 DIAGNOSIS — D631 Anemia in chronic kidney disease: Secondary | ICD-10-CM | POA: Diagnosis not present

## 2020-01-01 DIAGNOSIS — D509 Iron deficiency anemia, unspecified: Secondary | ICD-10-CM | POA: Diagnosis not present

## 2020-01-01 DIAGNOSIS — N186 End stage renal disease: Secondary | ICD-10-CM | POA: Diagnosis not present

## 2020-01-01 DIAGNOSIS — Z992 Dependence on renal dialysis: Secondary | ICD-10-CM | POA: Diagnosis not present

## 2020-01-01 DIAGNOSIS — N2581 Secondary hyperparathyroidism of renal origin: Secondary | ICD-10-CM | POA: Diagnosis not present

## 2020-01-03 ENCOUNTER — Other Ambulatory Visit (INDEPENDENT_AMBULATORY_CARE_PROVIDER_SITE_OTHER): Payer: Self-pay | Admitting: Vascular Surgery

## 2020-01-03 DIAGNOSIS — N2581 Secondary hyperparathyroidism of renal origin: Secondary | ICD-10-CM | POA: Diagnosis not present

## 2020-01-03 DIAGNOSIS — N186 End stage renal disease: Secondary | ICD-10-CM

## 2020-01-03 DIAGNOSIS — Z992 Dependence on renal dialysis: Secondary | ICD-10-CM | POA: Diagnosis not present

## 2020-01-03 DIAGNOSIS — Z9889 Other specified postprocedural states: Secondary | ICD-10-CM

## 2020-01-03 DIAGNOSIS — D631 Anemia in chronic kidney disease: Secondary | ICD-10-CM | POA: Diagnosis not present

## 2020-01-03 DIAGNOSIS — D509 Iron deficiency anemia, unspecified: Secondary | ICD-10-CM | POA: Diagnosis not present

## 2020-01-04 ENCOUNTER — Ambulatory Visit (INDEPENDENT_AMBULATORY_CARE_PROVIDER_SITE_OTHER): Payer: Medicare Other | Admitting: Nurse Practitioner

## 2020-01-04 ENCOUNTER — Encounter (INDEPENDENT_AMBULATORY_CARE_PROVIDER_SITE_OTHER): Payer: Self-pay | Admitting: Nurse Practitioner

## 2020-01-04 ENCOUNTER — Ambulatory Visit (INDEPENDENT_AMBULATORY_CARE_PROVIDER_SITE_OTHER): Payer: Medicare Other

## 2020-01-04 ENCOUNTER — Encounter (INDEPENDENT_AMBULATORY_CARE_PROVIDER_SITE_OTHER): Payer: Self-pay

## 2020-01-04 ENCOUNTER — Other Ambulatory Visit: Payer: Self-pay

## 2020-01-04 VITALS — Resp 14 | Ht 67.0 in | Wt 147.0 lb

## 2020-01-04 DIAGNOSIS — Z9889 Other specified postprocedural states: Secondary | ICD-10-CM

## 2020-01-04 DIAGNOSIS — N186 End stage renal disease: Secondary | ICD-10-CM | POA: Diagnosis not present

## 2020-01-04 DIAGNOSIS — E782 Mixed hyperlipidemia: Secondary | ICD-10-CM

## 2020-01-04 NOTE — Progress Notes (Signed)
SUBJECTIVE:  Patient ID: Richard Zavala, male    DOB: 08-26-1961, 59 y.o.   MRN: [id number] Chief Complaint  Patient presents with  . Follow-up    U/S Follow up    HPI  Richard Zavala is a 59 y.o. male presents today after right radiocephalic placement on 87/07/6766.  Since that time the patient denies any significant pain or swelling.  Overall he saying he is doing well postsurgically.  He currently continues to use his left radiocephalic AV fistula however it noticeably has had some infiltration within the last few days.  Otherwise, the patient denies any issues with it.  He denies any fever, chills, nausea, vomiting or diarrhea.  He denies any chest pain or shortness of breath.  There is no dehiscence of the scar however the fistula is not yet palpable.  Today the patient has a flow volume of 513.  The radiocephalic AV fistula is patent throughout however the outflow vein is patent up to the antecubital fossa flow was not seen in the cephalic vein in the upper arm.  The basilic vein appears to be patent in the upper arm.  Past Medical History:  Diagnosis Date  . Anemia   . Blind   . BPH (benign prostatic hyperplasia)   . CAD (coronary artery disease)   . CHF (congestive heart failure) ([facility])   . Chronic kidney disease (CKD), stage IV (severe) ([facility])    DIALYSIS  . Diabetes mellitus without complication ([place])   . ESRD (end stage renal disease) ([place])    Monday-Wednesday-Friday dialysis  . Hyperlipemia   . Hypertension   . Neuropathy   . Osteoporosis   . Pulmonary HTN ([place])   . Stroke [facility])    2013  . TIA (transient ischemic attack)   . TRD (traction retinal detachment)   . Wilms' tumor [facility])     Past Surgical History:  Procedure Laterality Date  . A/V FISTULAGRAM Left 01/28/2017   Procedure: A/V Fistulagram;  Surgeon: Algernon Huxley, MD;  Location: [place] [facility];  Service: Cardiovascular;  Laterality: Left;  . A/V FISTULAGRAM Left 04/28/2018   Procedure: A/V FISTULAGRAM;   Surgeon: Algernon Huxley, MD;  Location: [place] [facility];  Service: Cardiovascular;  Laterality: Left;  . AV FISTULA PLACEMENT    . AV FISTULA PLACEMENT Right 11/23/2019   Procedure: ARTERIOVENOUS (AV) FISTULA CREATION (RADIOCEPHALIC );  Surgeon: Algernon Huxley, MD;  Location: [facility];  Service: Vascular;  Laterality: Right;  . CARDIAC CATHETERIZATION    . CATARACT EXTRACTION W/PHACO Right 05/14/2016   Procedure: CATARACT EXTRACTION PHACO AND INTRAOCULAR LENS PLACEMENT (IOC);  Surgeon: Eulogio Bear, MD;  Location: [facility];  Service: Ophthalmology;  Laterality: Right;  [place] 1.46 AP% 11.5 CDE 12.33 FLUID PACK LOT # [id number]  . EYE SURGERY    . FRACTURE SURGERY     RIGHT LEG WITH ROD  . HIP SURGERY    . NEPHRECTOMY RADICAL    . PERIPHERAL VASCULAR CATHETERIZATION N/A 08/28/2015   Procedure: A/V Shuntogram/Fistulagram;  Surgeon: Algernon Huxley, MD;  Location: [place] [facility];  Service: Cardiovascular;  Laterality: N/A;  . PERIPHERAL VASCULAR CATHETERIZATION Left 08/28/2015   Procedure: A/V Shunt Intervention;  Surgeon: Algernon Huxley, MD;  Location: [place] [facility];  Service: Cardiovascular;  Laterality: Left;    Social History   Socioeconomic History  . Marital status: Married    Spouse name: Not on file  . Number of children: Not on file  .  Years of education: Not on file  . Highest education level: Not on file  Occupational History  . Not on file  Tobacco Use  . Smoking status: Former Smoker    Packs/day: 1.00    Types: Cigarettes    Quit date: 06/26/2012    Years since quitting: 7.5  . Smokeless tobacco: Never Used  Substance and Sexual Activity  . Alcohol use: No  . Drug use: No  . Sexual activity: Never  Other Topics Concern  . Not on file  Social History Narrative   Lives at home with his children. Ambulates well at baseline.   Social Determinants of Health   Financial Resource Strain:   . Difficulty of Paying Living Expenses: Not on file  Food Insecurity:   .  Worried About Charity fundraiser in the Last Year: Not on file  . Ran Out of Food in the Last Year: Not on file  Transportation Needs:   . Lack of Transportation (Medical): Not on file  . Lack of Transportation (Non-Medical): Not on file  Physical Activity:   . Days of Exercise per Week: Not on file  . Minutes of Exercise per Session: Not on file  Stress:   . Feeling of Stress : Not on file  Social Connections:   . Frequency of Communication with Friends and Family: Not on file  . Frequency of Social Gatherings with Friends and Family: Not on file  . Attends Religious Services: Not on file  . Active Member of Clubs or Organizations: Not on file  . Attends Archivist Meetings: Not on file  . Marital Status: Not on file  Intimate Partner Violence:   . Fear of Current or Ex-Partner: Not on file  . Emotionally Abused: Not on file  . Physically Abused: Not on file  . Sexually Abused: Not on file    Family History  Problem Relation Zavala of Onset  . CAD Father   . COPD Father   . CAD Paternal Grandfather   . Diabetes Maternal Aunt   . Diabetes Maternal Uncle     Allergies  Allergen Reactions  . Metformin Other (See Comments)    Severe diarrhea   . Penicillins Hives and Other (See Comments)    Has patient had a PCN reaction causing immediate rash, facial/tongue/throat swelling, SOB or lightheadedness with hypotension:Yes Has patient had a PCN reaction causing severe rash involving mucus membranes or skin necrosis:Yes Has patient had a PCN reaction that required hospitalization:inpatient at the time of reaction. Has patient had a PCN reaction occurring within the last 10 years:No If all of the above answers are "NO", then may proceed with Cephalosporin use.      Review of Systems   Review of Systems: Negative Unless Checked Constitutional: [] Weight loss  [] Fever  [] Chills Cardiac: [] Chest pain   []  Atrial Fibrillation  [] Palpitations   [] Shortness of breath when  laying flat   [] Shortness of breath with exertion. [] Shortness of breath at rest Vascular:  [] Pain in legs with walking   [] Pain in legs with standing [] Pain in legs when laying flat   [] Claudication    [] Pain in feet when laying flat    [] History of DVT   [] Phlebitis   [] Swelling in legs   [] Varicose veins   [] Non-healing ulcers Pulmonary:   [] Uses home oxygen   [] Productive cough   [] Hemoptysis   [] Wheeze  [] COPD   [] Asthma Neurologic:  [] Dizziness   [] Seizures  [] Blackouts [] History of stroke   [  x]History of TIA  [] Aphasia   [] Temporary Blindness   [] Weakness or numbness in arm   [] Weakness or numbness in leg Musculoskeletal:   [] Joint swelling   [] Joint pain   [] Low back pain  []  History of Knee Replacement [] Arthritis [] back Surgeries  []  Spinal Stenosis    Hematologic:  [] Easy bruising  [] Easy bleeding   [] Hypercoagulable state   [x] Anemic Gastrointestinal:  [] Diarrhea   [] Vomiting  [] Gastroesophageal reflux/heartburn   [] Difficulty swallowing. [] Abdominal pain Genitourinary:  [x] Chronic kidney disease   [] Difficult urination  [] Anuric   [] Blood in urine [] Frequent urination  [] Burning with urination   [] Hematuria Skin:  [] Rashes   [] Ulcers [] Wounds Psychological:  [] History of anxiety   []  History of major depression  []  Memory Difficulties      OBJECTIVE:   Physical Exam  Resp 14   Ht 5\' 7"  (1.702 m)   Wt 147 lb (66.7 kg)   BMI 23.02 kg/m   Gen: WD/WN, NAD Head: [place]/AT, No temporalis wasting.  Ear/Nose/Throat: Hearing grossly intact, nares w/o erythema or drainage Eyes: PER, EOMI, sclera nonicteric.  Neck: Supple, no masses.  No JVD.  Pulmonary:  Good air movement, no use of accessory muscles.  Cardiac: RRR Vascular:  soft thrill and bruit non palpable fistula Vessel Right Left  Radial Palpable Palpable   Gastrointestinal: soft, non-distended. No guarding/no peritoneal signs.  Musculoskeletal: Uses walker for ambulation.  Unsteady No deformity or atrophy.  Neurologic: Pain  and light touch intact in extremities.  Symmetrical.  Speech is fluent. Motor exam as listed above. Psychiatric: Judgment intact, Mood & affect appropriate for pt's clinical situation. Dermatologic: No Venous rashes. No Ulcers Noted.  No changes consistent with cellulitis. Lymph : No Cervical lymphadenopathy, no lichenification or skin changes of chronic lymphedema.       ASSESSMENT AND PLAN:  1. End stage renal disease ([place]) Patient's fistula has not matured at this time.  It has been about 6 weeks so we can try to wait a little more  time to mature.  It is noted that there is also no flow through the cephalic vein  In the upper arm.  We will have the patient return in 3 weeks with an HDA in order to see if there is been any improvement with flow volumes in addition to palpability of his fistula.  If not, we will likely plan for a fistulogram to help with fistula maturation.  Patient is able to utilize his current fistula until then.  2. Mixed hyperlipidemia Continue statin as ordered and reviewed, no changes at this time    Current Outpatient Medications on File Prior to Visit  Medication Sig Dispense Refill  . amLODipine (NORVASC) 10 MG tablet Take 1 tablet (10 mg total) by mouth at bedtime. 30 tablet 0  . atorvastatin (LIPITOR) 80 MG tablet Take 1 tablet (80 mg total) by mouth daily. (Patient taking differently: Take 80 mg by mouth at bedtime. ) 30 tablet 2  . carvedilol (COREG) 12.5 MG tablet Take 37.5 mg by mouth 2 (two) times daily.    . clopidogrel (PLAVIX) 75 MG tablet Take 1 tablet (75 mg total) by mouth daily. (Patient taking differently: Take 75 mg by mouth See admin instructions. Take 75 mg by mouth daily on Saturday, Sunday, Tuesday and Thursday) 30 tablet 2  . furosemide (LASIX) 40 MG tablet Take 40 mg by mouth See admin instructions. Take 40 mg by mouth twice daily on Tuesday, Thursday, Saturday and Sunday.    . hydrALAZINE (  APRESOLINE) 100 MG tablet Take 100 mg by mouth 3  (three) times daily.    . insulin glargine (LANTUS) 100 UNIT/ML injection Inject 9-10 Units into the skin daily.     . Insulin Pen Needle (RELION PEN NEEDLE 31G/8MM) 31G X 8 MM MISC USE AS DIRECTED ONCE DAILY WITH  LANTUS    . isosorbide mononitrate (IMDUR) 30 MG 24 hr tablet Take 1 tablet (30 mg total) by mouth daily. (Patient taking differently: Take 30 mg by mouth at bedtime. ) 30 tablet 0  . polyethylene glycol (GOLYTELY) 236 g solution Take by mouth.    Marland Kitchen HYDROcodone-acetaminophen (NORCO) 5-325 MG tablet Take 1 tablet by mouth every 6 (six) hours as needed for moderate pain. (Patient not taking: Reported on 01/04/2020) 30 tablet 0  . naproxen sodium (ANAPROX) 220 MG tablet Take 220 mg by mouth 2 (two) times daily as needed (for pain or headache).      No current facility-administered medications on file prior to visit.    There are no Patient Instructions on file for this visit. No follow-ups on file.   Kris Hartmann, NP  This note was completed with Sales executive.  Any errors are purely unintentional.

## 2020-01-05 DIAGNOSIS — D509 Iron deficiency anemia, unspecified: Secondary | ICD-10-CM | POA: Diagnosis not present

## 2020-01-05 DIAGNOSIS — D631 Anemia in chronic kidney disease: Secondary | ICD-10-CM | POA: Diagnosis not present

## 2020-01-05 DIAGNOSIS — Z992 Dependence on renal dialysis: Secondary | ICD-10-CM | POA: Diagnosis not present

## 2020-01-05 DIAGNOSIS — N186 End stage renal disease: Secondary | ICD-10-CM | POA: Diagnosis not present

## 2020-01-05 DIAGNOSIS — N2581 Secondary hyperparathyroidism of renal origin: Secondary | ICD-10-CM | POA: Diagnosis not present

## 2020-01-08 DIAGNOSIS — N186 End stage renal disease: Secondary | ICD-10-CM | POA: Diagnosis not present

## 2020-01-08 DIAGNOSIS — N2581 Secondary hyperparathyroidism of renal origin: Secondary | ICD-10-CM | POA: Diagnosis not present

## 2020-01-08 DIAGNOSIS — D631 Anemia in chronic kidney disease: Secondary | ICD-10-CM | POA: Diagnosis not present

## 2020-01-08 DIAGNOSIS — Z992 Dependence on renal dialysis: Secondary | ICD-10-CM | POA: Diagnosis not present

## 2020-01-08 DIAGNOSIS — D509 Iron deficiency anemia, unspecified: Secondary | ICD-10-CM | POA: Diagnosis not present

## 2020-01-10 DIAGNOSIS — N186 End stage renal disease: Secondary | ICD-10-CM | POA: Diagnosis not present

## 2020-01-10 DIAGNOSIS — D631 Anemia in chronic kidney disease: Secondary | ICD-10-CM | POA: Diagnosis not present

## 2020-01-10 DIAGNOSIS — Z992 Dependence on renal dialysis: Secondary | ICD-10-CM | POA: Diagnosis not present

## 2020-01-10 DIAGNOSIS — D509 Iron deficiency anemia, unspecified: Secondary | ICD-10-CM | POA: Diagnosis not present

## 2020-01-10 DIAGNOSIS — N2581 Secondary hyperparathyroidism of renal origin: Secondary | ICD-10-CM | POA: Diagnosis not present

## 2020-01-12 DIAGNOSIS — Z992 Dependence on renal dialysis: Secondary | ICD-10-CM | POA: Diagnosis not present

## 2020-01-12 DIAGNOSIS — N186 End stage renal disease: Secondary | ICD-10-CM | POA: Diagnosis not present

## 2020-01-12 DIAGNOSIS — D631 Anemia in chronic kidney disease: Secondary | ICD-10-CM | POA: Diagnosis not present

## 2020-01-12 DIAGNOSIS — D509 Iron deficiency anemia, unspecified: Secondary | ICD-10-CM | POA: Diagnosis not present

## 2020-01-12 DIAGNOSIS — N2581 Secondary hyperparathyroidism of renal origin: Secondary | ICD-10-CM | POA: Diagnosis not present

## 2020-01-15 DIAGNOSIS — Z992 Dependence on renal dialysis: Secondary | ICD-10-CM | POA: Diagnosis not present

## 2020-01-15 DIAGNOSIS — N2581 Secondary hyperparathyroidism of renal origin: Secondary | ICD-10-CM | POA: Diagnosis not present

## 2020-01-15 DIAGNOSIS — D631 Anemia in chronic kidney disease: Secondary | ICD-10-CM | POA: Diagnosis not present

## 2020-01-15 DIAGNOSIS — D509 Iron deficiency anemia, unspecified: Secondary | ICD-10-CM | POA: Diagnosis not present

## 2020-01-15 DIAGNOSIS — Z794 Long term (current) use of insulin: Secondary | ICD-10-CM | POA: Diagnosis not present

## 2020-01-15 DIAGNOSIS — E119 Type 2 diabetes mellitus without complications: Secondary | ICD-10-CM | POA: Diagnosis not present

## 2020-01-15 DIAGNOSIS — N186 End stage renal disease: Secondary | ICD-10-CM | POA: Diagnosis not present

## 2020-01-17 DIAGNOSIS — Z992 Dependence on renal dialysis: Secondary | ICD-10-CM | POA: Diagnosis not present

## 2020-01-17 DIAGNOSIS — N2581 Secondary hyperparathyroidism of renal origin: Secondary | ICD-10-CM | POA: Diagnosis not present

## 2020-01-17 DIAGNOSIS — N186 End stage renal disease: Secondary | ICD-10-CM | POA: Diagnosis not present

## 2020-01-17 DIAGNOSIS — D509 Iron deficiency anemia, unspecified: Secondary | ICD-10-CM | POA: Diagnosis not present

## 2020-01-17 DIAGNOSIS — D631 Anemia in chronic kidney disease: Secondary | ICD-10-CM | POA: Diagnosis not present

## 2020-01-19 DIAGNOSIS — N2581 Secondary hyperparathyroidism of renal origin: Secondary | ICD-10-CM | POA: Diagnosis not present

## 2020-01-19 DIAGNOSIS — N186 End stage renal disease: Secondary | ICD-10-CM | POA: Diagnosis not present

## 2020-01-19 DIAGNOSIS — D631 Anemia in chronic kidney disease: Secondary | ICD-10-CM | POA: Diagnosis not present

## 2020-01-19 DIAGNOSIS — Z992 Dependence on renal dialysis: Secondary | ICD-10-CM | POA: Diagnosis not present

## 2020-01-19 DIAGNOSIS — D509 Iron deficiency anemia, unspecified: Secondary | ICD-10-CM | POA: Diagnosis not present

## 2020-01-21 DIAGNOSIS — Z992 Dependence on renal dialysis: Secondary | ICD-10-CM | POA: Diagnosis not present

## 2020-01-21 DIAGNOSIS — N186 End stage renal disease: Secondary | ICD-10-CM | POA: Diagnosis not present

## 2020-02-01 ENCOUNTER — Ambulatory Visit (INDEPENDENT_AMBULATORY_CARE_PROVIDER_SITE_OTHER): Payer: Medicare Other | Admitting: Nurse Practitioner

## 2020-02-01 ENCOUNTER — Encounter (INDEPENDENT_AMBULATORY_CARE_PROVIDER_SITE_OTHER): Payer: Medicare Other

## 2020-02-07 ENCOUNTER — Other Ambulatory Visit (INDEPENDENT_AMBULATORY_CARE_PROVIDER_SITE_OTHER): Payer: Self-pay | Admitting: Vascular Surgery

## 2020-02-07 DIAGNOSIS — Z992 Dependence on renal dialysis: Secondary | ICD-10-CM

## 2020-02-07 DIAGNOSIS — N186 End stage renal disease: Secondary | ICD-10-CM

## 2020-02-08 ENCOUNTER — Ambulatory Visit (INDEPENDENT_AMBULATORY_CARE_PROVIDER_SITE_OTHER): Payer: Medicare Other | Admitting: Nurse Practitioner

## 2020-02-08 ENCOUNTER — Encounter (INDEPENDENT_AMBULATORY_CARE_PROVIDER_SITE_OTHER): Payer: Medicare Other

## 2020-02-20 ENCOUNTER — Ambulatory Visit (INDEPENDENT_AMBULATORY_CARE_PROVIDER_SITE_OTHER): Payer: Medicare Other | Admitting: Nurse Practitioner

## 2020-02-20 ENCOUNTER — Encounter (INDEPENDENT_AMBULATORY_CARE_PROVIDER_SITE_OTHER): Payer: Medicare Other

## 2020-03-07 ENCOUNTER — Emergency Department: Payer: Medicare Other

## 2020-03-07 ENCOUNTER — Other Ambulatory Visit: Payer: Self-pay

## 2020-03-07 ENCOUNTER — Ambulatory Visit (INDEPENDENT_AMBULATORY_CARE_PROVIDER_SITE_OTHER): Payer: Medicare Other

## 2020-03-07 ENCOUNTER — Ambulatory Visit (INDEPENDENT_AMBULATORY_CARE_PROVIDER_SITE_OTHER): Payer: Medicare Other | Admitting: Nurse Practitioner

## 2020-03-07 ENCOUNTER — Encounter (INDEPENDENT_AMBULATORY_CARE_PROVIDER_SITE_OTHER): Payer: Self-pay | Admitting: Nurse Practitioner

## 2020-03-07 ENCOUNTER — Emergency Department
Admission: EM | Admit: 2020-03-07 | Discharge: 2020-03-07 | Disposition: A | Discharge status: Home / Self Care | Payer: Medicare Other | Attending: Emergency Medicine | Admitting: Emergency Medicine

## 2020-03-07 VITALS — BP 162/60 | HR 65 | Ht 68.0 in | Wt 143.3 lb

## 2020-03-07 DIAGNOSIS — S39011A Strain of muscle, fascia and tendon of abdomen, initial encounter: Secondary | ICD-10-CM

## 2020-03-07 DIAGNOSIS — Z992 Dependence on renal dialysis: Secondary | ICD-10-CM

## 2020-03-07 DIAGNOSIS — Z79899 Other long term (current) drug therapy: Secondary | ICD-10-CM | POA: Diagnosis not present

## 2020-03-07 DIAGNOSIS — R0602 Shortness of breath: Secondary | ICD-10-CM | POA: Insufficient documentation

## 2020-03-07 DIAGNOSIS — Z794 Long term (current) use of insulin: Secondary | ICD-10-CM | POA: Diagnosis not present

## 2020-03-07 DIAGNOSIS — E782 Mixed hyperlipidemia: Secondary | ICD-10-CM | POA: Diagnosis not present

## 2020-03-07 DIAGNOSIS — X58XXXA Exposure to other specified factors, initial encounter: Secondary | ICD-10-CM | POA: Insufficient documentation

## 2020-03-07 DIAGNOSIS — E1122 Type 2 diabetes mellitus with diabetic chronic kidney disease: Secondary | ICD-10-CM | POA: Insufficient documentation

## 2020-03-07 DIAGNOSIS — R1012 Left upper quadrant pain: Secondary | ICD-10-CM | POA: Insufficient documentation

## 2020-03-07 DIAGNOSIS — Y939 Activity, unspecified: Secondary | ICD-10-CM | POA: Insufficient documentation

## 2020-03-07 DIAGNOSIS — I2510 Atherosclerotic heart disease of native coronary artery without angina pectoris: Secondary | ICD-10-CM | POA: Insufficient documentation

## 2020-03-07 DIAGNOSIS — S3991XA Unspecified injury of abdomen, initial encounter: Secondary | ICD-10-CM | POA: Diagnosis present

## 2020-03-07 DIAGNOSIS — E1140 Type 2 diabetes mellitus with diabetic neuropathy, unspecified: Secondary | ICD-10-CM | POA: Diagnosis not present

## 2020-03-07 DIAGNOSIS — N186 End stage renal disease: Secondary | ICD-10-CM | POA: Diagnosis not present

## 2020-03-07 DIAGNOSIS — Y998 Other external cause status: Secondary | ICD-10-CM | POA: Insufficient documentation

## 2020-03-07 DIAGNOSIS — I509 Heart failure, unspecified: Secondary | ICD-10-CM | POA: Diagnosis not present

## 2020-03-07 DIAGNOSIS — Y929 Unspecified place or not applicable: Secondary | ICD-10-CM | POA: Insufficient documentation

## 2020-03-07 DIAGNOSIS — Z8673 Personal history of transient ischemic attack (TIA), and cerebral infarction without residual deficits: Secondary | ICD-10-CM | POA: Insufficient documentation

## 2020-03-07 DIAGNOSIS — Z87891 Personal history of nicotine dependence: Secondary | ICD-10-CM | POA: Diagnosis not present

## 2020-03-07 DIAGNOSIS — I132 Hypertensive heart and chronic kidney disease with heart failure and with stage 5 chronic kidney disease, or end stage renal disease: Secondary | ICD-10-CM | POA: Insufficient documentation

## 2020-03-07 DIAGNOSIS — I10 Essential (primary) hypertension: Secondary | ICD-10-CM | POA: Diagnosis not present

## 2020-03-07 LAB — CBC WITH DIFFERENTIAL/PLATELET
Abs Immature Granulocytes: 0.02 10*3/uL (ref 0.00–0.07)
Basophils Absolute: 0 10*3/uL (ref 0.0–0.1)
Basophils Relative: 1 %
Eosinophils Absolute: 0.1 10*3/uL (ref 0.0–0.5)
Eosinophils Relative: 2 %
HCT: 39.6 % (ref 39.0–52.0)
Hemoglobin: 12.9 g/dL — ABNORMAL LOW (ref 13.0–17.0)
Immature Granulocytes: 0 %
Lymphocytes Relative: 14 %
Lymphs Abs: 1 10*3/uL (ref 0.7–4.0)
MCH: 30.9 pg (ref 26.0–34.0)
MCHC: 32.6 g/dL (ref 30.0–36.0)
MCV: 94.7 fL (ref 80.0–100.0)
Monocytes Absolute: 0.7 10*3/uL (ref 0.1–1.0)
Monocytes Relative: 10 %
Neutro Abs: 5.4 10*3/uL (ref 1.7–7.7)
Neutrophils Relative %: 73 %
Platelets: 215 10*3/uL (ref 150–400)
RBC: 4.18 MIL/uL — ABNORMAL LOW (ref 4.22–5.81)
RDW: 13.8 % (ref 11.5–15.5)
WBC: 7.3 10*3/uL (ref 4.0–10.5)
nRBC: 0 % (ref 0.0–0.2)

## 2020-03-07 LAB — COMPREHENSIVE METABOLIC PANEL
ALT: 11 U/L (ref 0–44)
AST: 15 U/L (ref 15–41)
Albumin: 3.9 g/dL (ref 3.5–5.0)
Alkaline Phosphatase: 123 U/L (ref 38–126)
Anion gap: 14 (ref 5–15)
BUN: 35 mg/dL — ABNORMAL HIGH (ref 6–20)
CO2: 25 mmol/L (ref 22–32)
Calcium: 8.9 mg/dL (ref 8.9–10.3)
Chloride: 99 mmol/L (ref 98–111)
Creatinine, Ser: 6.6 mg/dL — ABNORMAL HIGH (ref 0.61–1.24)
GFR calc Af Amer: 10 mL/min — ABNORMAL LOW (ref >60)
GFR calc non Af Amer: 8 mL/min — ABNORMAL LOW (ref >60)
Glucose, Bld: 138 mg/dL — ABNORMAL HIGH (ref 70–99)
Potassium: 3.8 mmol/L (ref 3.5–5.1)
Sodium: 138 mmol/L (ref 135–145)
Total Bilirubin: 0.8 mg/dL (ref 0.3–1.2)
Total Protein: 7.6 g/dL (ref 6.5–8.1)

## 2020-03-07 LAB — LIPASE, BLOOD: Lipase: 21 U/L (ref 11–51)

## 2020-03-07 LAB — TROPONIN I (HIGH SENSITIVITY)
Troponin I (High Sensitivity): 24 ng/L — ABNORMAL HIGH (ref <18)
Troponin I (High Sensitivity): 21 ng/L — ABNORMAL HIGH (ref <18)

## 2020-03-07 MED ORDER — METHOCARBAMOL 500 MG PO TABS
1000.0000 mg | ORAL_TABLET | Freq: Once | ORAL | Status: AC
  Administered 2020-03-07: 1000 mg via ORAL
  Filled 2020-03-07: qty 2

## 2020-03-07 MED ORDER — METHOCARBAMOL 500 MG PO TABS: 500.0000 mg | ORAL_TABLET | Freq: Three times a day (TID) | ORAL | 0 refills | Status: AC | PRN

## 2020-03-07 MED ORDER — IOHEXOL 350 MG/ML SOLN
75.0000 mL | Freq: Once | INTRAVENOUS | Status: AC | PRN
  Administered 2020-03-07: 20:00:00 75 mL via INTRAVENOUS
  Filled 2020-03-07: qty 75

## 2020-03-07 NOTE — ED Provider Notes (Signed)
Emergency Department Provider Note  ____________________________________________  Time seen: Approximately 7:00 PM  I have reviewed the triage vital signs and the nursing notes.   HISTORY  Chief Complaint ribcage pain   Historian Patient    HPI Richard Zavala is a 59 y.o. male with a history of end-stage renal disease, coronary artery disease, CHF, CVA, Wilms tumor and diabetes, presents to the emergency department with left-sided flank pain, left upper quadrant abdominal pain and shortness of breath that is worsened in intensity over the past 2 to 3 days.  Patient states that shortness of breath occurs when his abdomen "spasms".  He denies associated nausea, vomiting or diarrhea.  He has not experienced any recent falls or mechanisms of trauma.  He denies dysuria or increased urinary frequency. No fever noted at home.  Denies history of prior PE and is unsure whether or not he is taking medications for anticoagulation.   Past Medical History:  Diagnosis Date  . Anemia   . Blind   . BPH (benign prostatic hyperplasia)   . CAD (coronary artery disease)   . CHF (congestive heart failure) ([place])   . Chronic kidney disease (CKD), stage IV (severe) (HCC)    DIALYSIS  . Diabetes mellitus without complication ([place])   . ESRD (end stage renal disease) ([place])    Monday-Wednesday-Friday dialysis  . Hyperlipemia   . Hypertension   . Neuropathy   . Osteoporosis   . Pulmonary HTN ([place])   . Stroke [facility])    2013  . TIA (transient ischemic attack)   . TRD (traction retinal detachment)   . Wilms' tumor [facility])      Immunizations up to date:  Yes.     Past Medical History:  Diagnosis Date  . Anemia   . Blind   . BPH (benign prostatic hyperplasia)   . CAD (coronary artery disease)   . CHF (congestive heart failure) ()   . Chronic kidney disease (CKD), stage IV (severe) (HCC)    DIALYSIS  . Diabetes mellitus without complication ([place])   . ESRD (end stage renal disease) ([place])     Monday-Wednesday-Friday dialysis  . Hyperlipemia   . Hypertension   . Neuropathy   . Osteoporosis   . Pulmonary HTN ([place])   . Stroke [facility])    2013  . TIA (transient ischemic attack)   . TRD (traction retinal detachment)   . Wilms' tumor [facility])     Patient Active Problem List   Diagnosis Date Noted  . Gait instability 10/12/2019  . Pulmonary nodule 10/12/2019  . CAD (coronary artery disease), native coronary artery 03/08/2018  . Hyperlipidemia 03/08/2018  . Essential hypertension 03/08/2018  . Wilms' tumor with favorable history 03/01/2018  . Closed fracture of right distal radius 08/22/2016  . TIA (transient ischemic attack) 08/21/2016  . Encounter for pre-transplant evaluation for kidney transplant 05/17/2016  . Anemia due to other cause   . Hyperkalemia   . Hypoglycemia   . Pain   . End stage renal disease ([place])   . Weakness   . Other chest pain   . Elevated troponin   . Hyperkalemia, diminished renal excretion 08/14/2015  . Hypertensive urgency 03/26/2015  . Anemia in CKD (chronic kidney disease) 12/04/2014  . Chronic kidney disease, stage IV (severe) ([place]) 12/03/2014  . Acute ischemic stroke ([place]) 08/25/2014  . Acute lacunar stroke ([place]) 06/25/2014  . Orthostatic hypotension 06/25/2014  . Diabetic vitreous hemorrhage associated with type 2 diabetes mellitus ([place]) 09/28/2013  . PDR (  proliferative diabetic retinopathy) ([place]) 09/28/2013  . History of Wilms' tumor 08/15/2013  . Iron deficiency anemia 07/21/2013  . Mesenteric artery stenosis ([place]) 03/27/2013  . Heart failure, chronic systolic ([place]) 63/78/5885  . Ischemic cardiomyopathy 03/13/2013  . Pulmonary HTN ([place]) 03/13/2013  . Peripheral edema 02/28/2013  . Diabetic macular edema of both eyes ([place]) 01/16/2013  . Corneal epithelial defect 10/06/2012  . TRD (traction retinal detachment) 09/22/2012  . Vitreous hemorrhage of both eyes ([place]) 07/21/2012  . Closed right hip fracture ([place]) 02/19/2012  . Diabetic  sensorimotor polyneuropathy ([place]) 01/27/2012  . Vitamin D deficiency disease 11/30/2011  . Diabetes type 2, uncontrolled ([place]) 07/03/2011  . Type 2 diabetes mellitus with kidney complication, with long-term current use of insulin ([place]) 07/03/2011  . Osteoporosis with fracture 05/28/2011    Past Surgical History:  Procedure Laterality Date  . A/V FISTULAGRAM Left 01/28/2017   Procedure: A/V Fistulagram;  Surgeon: Algernon Huxley, MD;  Location: [place] [facility];  Service: Cardiovascular;  Laterality: Left;  . A/V FISTULAGRAM Left 04/28/2018   Procedure: A/V FISTULAGRAM;  Surgeon: Algernon Huxley, MD;  Location: [place] [facility];  Service: Cardiovascular;  Laterality: Left;  . AV FISTULA PLACEMENT    . AV FISTULA PLACEMENT Right 11/23/2019   Procedure: ARTERIOVENOUS (AV) FISTULA CREATION (RADIOCEPHALIC );  Surgeon: Algernon Huxley, MD;  Location: [facility] ORS;  Service: Vascular;  Laterality: Right;  . CARDIAC CATHETERIZATION    . CATARACT EXTRACTION W/PHACO Right 05/14/2016   Procedure: CATARACT EXTRACTION PHACO AND INTRAOCULAR LENS PLACEMENT (IOC);  Surgeon: Eulogio Bear, MD;  Location: [facility] ORS;  Service: Ophthalmology;  Laterality: Right;  [place] 1.46 AP% 11.5 CDE 12.33 FLUID PACK LOT # [id number]  . EYE SURGERY    . FRACTURE SURGERY     RIGHT LEG WITH ROD  . HIP SURGERY    . NEPHRECTOMY RADICAL    . PERIPHERAL VASCULAR CATHETERIZATION N/A 08/28/2015   Procedure: A/V Shuntogram/Fistulagram;  Surgeon: Algernon Huxley, MD;  Location: [place] [facility];  Service: Cardiovascular;  Laterality: N/A;  . PERIPHERAL VASCULAR CATHETERIZATION Left 08/28/2015   Procedure: A/V Shunt Intervention;  Surgeon: Algernon Huxley, MD;  Location: [place] [facility];  Service: Cardiovascular;  Laterality: Left;    Prior to Admission medications   Medication Sig Start Date End Date Taking? Authorizing Provider  amLODipine (NORVASC) 10 MG tablet Take 1 tablet (10 mg total) by mouth at bedtime. 08/17/15   Epifanio Lesches, MD  atorvastatin (LIPITOR) 80 MG tablet Take 1 tablet (80 mg total) by mouth daily. Patient taking differently: Take 80 mg by mouth at bedtime.  08/22/16   Demetrios Loll, MD  carvedilol (COREG) 12.5 MG tablet Take 37.5 mg by mouth 2 (two) times daily.    [provider]  clopidogrel (PLAVIX) 75 MG tablet Take 1 tablet (75 mg total) by mouth daily. Patient taking differently: Take 75 mg by mouth See admin instructions. Take 75 mg by mouth daily on Saturday, Sunday, Tuesday and Thursday 08/22/16   Demetrios Loll, MD  furosemide (LASIX) 40 MG tablet Take 40 mg by mouth See admin instructions. Take 40 mg by mouth twice daily on Tuesday, Thursday, Saturday and Sunday.    [provider]  hydrALAZINE (APRESOLINE) 100 MG tablet Take 100 mg by mouth 3 (three) times daily.    [provider]  HYDROcodone-acetaminophen (NORCO) 5-325 MG tablet Take 1 tablet by mouth every 6 (six) hours as needed for moderate pain. 11/23/19  Algernon Huxley, MD  insulin glargine (LANTUS) 100 UNIT/ML injection Inject 9-10 Units into the skin daily.     [provider]  Insulin Pen Needle (RELION PEN NEEDLE 31G/8MM) 31G X 8 MM MISC USE AS DIRECTED ONCE DAILY WITH  LANTUS 11/07/19   [provider]  isosorbide mononitrate (IMDUR) 30 MG 24 hr tablet Take 1 tablet (30 mg total) by mouth daily. Patient taking differently: Take 30 mg by mouth at bedtime.  08/17/15   Epifanio Lesches, MD  methocarbamol (ROBAXIN) 500 MG tablet Take 1 tablet (500 mg total) by mouth every 8 (eight) hours as needed for up to 5 days. 03/07/20 03/12/20  Lannie Fields, PA-C  naproxen sodium (ANAPROX) 220 MG tablet Take 220 mg by mouth 2 (two) times daily as needed (for pain or headache).     [provider]  polyethylene glycol (GOLYTELY) 236 g solution Take by mouth. 12/12/19   [provider]    Allergies Metformin and Penicillins  Family History  Problem Relation Age of Onset  . CAD  Father   . COPD Father   . CAD Paternal Grandfather   . Diabetes Maternal Aunt   . Diabetes Maternal Uncle     Social History Social History   Tobacco Use  . Smoking status: Former Smoker    Packs/day: 1.00    Types: Cigarettes    Quit date: 06/26/2012    Years since quitting: 7.7  . Smokeless tobacco: Never Used  Substance Use Topics  . Alcohol use: No  . Drug use: No     Review of Systems  Constitutional: No fever/chills Eyes:  No discharge ENT: No upper respiratory complaints. Respiratory: no cough. No SOB/ use of accessory muscles to breath Gastrointestinal: Patient has LUQ abdominal pain and left flank pain.  Musculoskeletal: Negative for musculoskeletal pain. Skin: Negative for rash, abrasions, lacerations, ecchymosis.    ____________________________________________   PHYSICAL EXAM:  VITAL SIGNS: ED Triage Vitals [03/07/20 1757]  Enc Vitals Group     BP 111/60     Pulse Rate 66     Resp 18     Temp 98.6 F (37 C)     Temp Source Oral     SpO2 100 %     Weight 167 lb 5.3 oz (75.9 kg)     Height 5\' 8"  (1.727 m)     Head Circumference      Peak Flow      Pain Score 7     Pain Loc      Pain Edu?      Excl. in [place]?      Constitutional: Alert and oriented. Well appearing and in no acute distress. Eyes: Conjunctivae are normal. PERRL. EOMI. Head: Atraumatic. ENT: Cardiovascular: Normal rate, regular rhythm. Normal S1 and S2.  Good peripheral circulation. Respiratory: Normal respiratory effort without tachypnea or retractions. Lungs CTAB. Good air entry to the bases with no decreased or absent breath sounds Gastrointestinal: Bowel sounds x 4 quadrants. Soft and nontender to palpation. No guarding or rigidity. No distention. Musculoskeletal: Full range of motion to all extremities. No obvious deformities noted Neurologic:  Normal for age. No gross focal neurologic deficits are appreciated.  Skin:  Skin is warm, dry and intact. No rash  noted. Psychiatric: Mood and affect are normal for age. Speech and behavior are normal.   ____________________________________________   LABS (all labs ordered are listed, but only abnormal results are displayed)  Labs Reviewed  CBC WITH DIFFERENTIAL/PLATELET -  Abnormal; Notable for the following components:      Result Value   RBC 4.18 (*)    Hemoglobin 12.9 (*)    All other components within normal limits  COMPREHENSIVE METABOLIC PANEL - Abnormal; Notable for the following components:   Glucose, Bld 138 (*)    BUN 35 (*)    Creatinine, Ser 6.60 (*)    GFR calc non Af Amer 8 (*)    GFR calc Af Amer 10 (*)    All other components within normal limits  TROPONIN I (HIGH SENSITIVITY) - Abnormal; Notable for the following components:   Troponin I (High Sensitivity) 24 (*)    All other components within normal limits  TROPONIN I (HIGH SENSITIVITY) - Abnormal; Notable for the following components:   Troponin I (High Sensitivity) 21 (*)    All other components within normal limits  LIPASE, BLOOD  URINALYSIS, COMPLETE (UACMP) WITH MICROSCOPIC   ____________________________________________  EKG   ____________________________________________  RADIOLOGY Unk Pinto, personally viewed and evaluated these images (plain radiographs) as part of my medical decision making, as well as reviewing the written report by the radiologist.    DG Chest 1 View  Result Date: 03/07/2020 CLINICAL DATA:  left ribcage pain x 2 weeks. Pt states he feels like his muscles "lock up" on him. He states it is right in his ribcage area but that it is muscular, and that it is set off by a deep breath or cough sometimes. EXAM: CHEST  1 VIEW COMPARISON:  08/14/2015 FINDINGS: Mild enlargement of the cardiopericardial silhouette. No mediastinal or hilar masses. Lungs demonstrate vascular congestion without overt pulmonary edema. No evidence of pneumonia. No pleural effusion or pneumothorax. Old healed rib  fractures on the left similar to the prior radiographs. No gross acute skeletal abnormality. IMPRESSION: 1. No acute cardiopulmonary disease. 2. Mild cardiomegaly and vascular congestion without pulmonary edema. Electronically Signed   By: Lajean Manes M.D.   On: 03/07/2020 19:16   CT Angio Chest PE W and/or Wo Contrast  Result Date: 03/07/2020 CLINICAL DATA:  Shortness of breath. Left rib pain. EXAM: CT ANGIOGRAPHY CHEST WITH CONTRAST TECHNIQUE: Multidetector CT imaging of the chest was performed using the standard protocol during bolus administration of intravenous contrast. Multiplanar CT image reconstructions and MIPs were obtained to evaluate the vascular anatomy. Performed in conjunction with CT of the abdomen and pelvis, reported separately. CONTRAST:  29mL OMNIPAQUE IOHEXOL 350 MG/ML SOLN COMPARISON:  Chest radiograph earlier this day. FINDINGS: Cardiovascular: There are no filling defects within the pulmonary arteries to suggest pulmonary embolus. Multi chamber cardiomegaly. Aortic atherosclerosis without dissection. No aortic aneurysm. There is a small pericardial effusion measuring up to 12 mm in depth adjacent to the right heart. There are coronary artery calcifications. Mediastinum/Nodes: No enlarged mediastinal or hilar lymph nodes. No esophageal wall thickening. No suspicious thyroid nodule. Lungs/Pleura: Small left pleural effusion. Mild peribronchial thickening which may be congestive. No focal airspace disease. No pulmonary mass. Calcified granuloma in the right lower lobe. Trace septal thickening at the bases. Upper Abdomen: Assessed on concurrent abdominal CT, reported separately. Musculoskeletal: Left posterior eighth through eleventh rib fractures are minimally displaced. There is minimal early callus formation. There are remote fractures of lateral left ribs. Remote fractures of posterior right ribs. Severe T12 compression fracture with the vertebra plana appearance. Review of the MIP  images confirms the above findings. IMPRESSION: 1. No pulmonary embolus. 2. Left posterior eighth through eleventh rib fractures are minimally displaced. There is  minimal callus formation suggesting these are subacute. There are additional remote bilateral rib fractures. 3. Small left pleural effusion. Trace septal thickening at the bases may be congestive. 4. Severe T12 compression fracture with the vertebra plana appearance, age indeterminate. 5. Multi chamber cardiomegaly with coronary artery calcifications. Small pericardial effusion. Aortic Atherosclerosis (ICD10-I70.0). Electronically Signed   By: Keith Rake M.D.   On: 03/07/2020 20:17   CT ABDOMEN PELVIS W CONTRAST  Result Date: 03/07/2020 CLINICAL DATA:  Abdominal distension. Patient reports left rib cage pain. EXAM: CT ABDOMEN AND PELVIS WITH CONTRAST TECHNIQUE: Multidetector CT imaging of the abdomen and pelvis was performed using the standard protocol following bolus administration of intravenous contrast. Performed in conjunction with CTA of the chest. CONTRAST:  74mL OMNIPAQUE IOHEXOL 350 MG/ML SOLN COMPARISON:  None. FINDINGS: Lower chest: Assessed on concurrent chest CT. Small left pleural effusion. Cardiomegaly with small pericardial effusion. Coronary artery calcifications. Hepatobiliary: No focal hepatic lesion. Few scattered calcified granuloma. Gallstones without pericholecystic inflammation or biliary dilatation. Pancreas: No ductal dilatation or inflammation. Spleen: Calcified granuloma. No focal lesion. No perisplenic fluid. Adrenals/Urinary Tract: No adrenal nodule. Prior left nephrectomy. No right hydronephrosis. Advanced right renal vascular calcifications. Parapelvic cysts in the upper right kidney. Absent renal excretion on delayed phase imaging. Urinary bladder is near completely empty. Stomach/Bowel: Nondistended stomach. No small bowel obstruction or inflammation. No abnormal small bowel distension. Normal appendix. Moderate  volume of stool throughout the colon. Transverse colon appears tortuous. No colonic wall thickening or inflammation. Vascular/Lymphatic: Advanced aortic and branch atherosclerosis. No bulky abdominopelvic adenopathy. Reproductive: Prominent prostate gland 5 cm transverse. Other: No free fluid or ascites. No free air. Musculoskeletal: Intramedullary nail with trans trochanteric screw fixation of the right proximal femur. Severe T12 compression fracture with vertebral plana, age indeterminate. Bones are under mineralized with suggestion of renal osteodystrophy. IMPRESSION: 1. No acute abnormality in the abdomen/pelvis. 2. Moderate stool burden throughout the colon, can be seen with constipation. No bowel obstruction or inflammation. 3. Cholelithiasis without gallbladder inflammation. 4. Prior left nephrectomy. 5. Severe aortic and branch atherosclerosis. Aortic Atherosclerosis (ICD10-I70.0). Electronically Signed   By: Keith Rake M.D.   On: 03/07/2020 20:23    ____________________________________________    PROCEDURES  Procedure(s) performed:     Procedures     Medications  iohexol (OMNIPAQUE) 350 MG/ML injection 75 mL (75 mLs Intravenous Contrast Given 03/07/20 1953)  methocarbamol (ROBAXIN) tablet 1,000 mg (1,000 mg Oral Given 03/07/20 2255)     ____________________________________________   INITIAL IMPRESSION / ASSESSMENT AND PLAN / ED COURSE  Pertinent labs & imaging results that were available during my care of the patient were reviewed by me and considered in my medical decision making (see chart for details).      Assessment and Plan: Left Flank pain:  SOB 59 year old male presents to the emergency department with left-sided flank pain and a sensation of shortness of breath that has worsened in intensity over the past 2 days.  Patient's vital signs were reviewed in triage and they were reassuring.  On physical exam, patient seemed to be resting comfortably for periods  of time and then would have a spasm of pain along the left upper quadrant and left flank.  Patient had no guarding to palpation along the left upper quadrant or CVA tenderness.  Differential diagnosis included intra-abdominal abscess, PE, pancreatitis, pneumonia, abdominal wall strain...  Initial troponin was mildly elevated at 24 and second troponin trended down at 21.  CBC and CMP were reassuring given patient's  history.  Potassium was within reference range.  Creatinine of 6.6 trended down from 7.43 months ago.  CT abdomen pelvis revealed no acute abnormality.  CTA indicated a severe T12 compression fracture.  Patient had no midline T12 thoracic pain and denied upper back pain.  Suspicious for remote fracture.  Abdominal wall spasm is likely at this time.  Patient was treated with Robaxin in the emergency department.  He was discharged with Robaxin.  Return precautions were given to return with new or worsening symptoms.  All patient questions were answered.   ____________________________________________  FINAL CLINICAL IMPRESSION(S) / ED DIAGNOSES  Final diagnoses:  Strain of abdominal wall, initial encounter      NEW MEDICATIONS STARTED DURING THIS VISIT:  ED Discharge Orders         Ordered    methocarbamol (ROBAXIN) 500 MG tablet  Every 8 hours PRN     03/07/20 2244              This chart was dictated using voice recognition software/Dragon. Despite best efforts to proofread, errors can occur which can change the meaning. Any change was purely unintentional.     Lannie Fields, PA-C 03/07/20 2321    Arta Silence, MD 03/07/20 2325

## 2020-03-07 NOTE — ED Notes (Signed)
See triage note  Presents with pain to left lateral rib area  States pain has been there off and on for about 2 weeks   Pain increases with movement    Denies any injury

## 2020-03-07 NOTE — ED Triage Notes (Signed)
Pt via pov from home with left ribcage pain x 2 weeks. Pt states he feels like his muscles "lock up" on him. He states it is right in his ribcage area but that it is muscular, and that it is set off by a deep breath or cough sometimes. Pt is dialysis pt (MWF). NAD noted.

## 2020-03-08 ENCOUNTER — Telehealth (INDEPENDENT_AMBULATORY_CARE_PROVIDER_SITE_OTHER): Payer: Self-pay

## 2020-03-08 NOTE — Telephone Encounter (Signed)
Patient was seen in our office yesterday and he has been scheduled for a right arm fistulagram with Dr. Lucky Cowboy on 03/14/20 with a 8:45 am arrival time to the [facility]. Pre-procedure instructions were faxed to [facility] for the patient. Patient will do covid testing on 03/12/20 between 8-1 pm at the [facility].

## 2020-03-11 ENCOUNTER — Encounter (INDEPENDENT_AMBULATORY_CARE_PROVIDER_SITE_OTHER): Payer: Self-pay | Admitting: Nurse Practitioner

## 2020-03-11 NOTE — Progress Notes (Signed)
SUBJECTIVE:  Patient ID: Richard Zavala, male    DOB: 1961/10/04, 59 y.o.   MRN: [id number] Chief Complaint  Patient presents with  . Follow-up    U/S Follow up    HPI  Richard Zavala is a 59 y.o. male that follows up for evaluation of his right radiocephalic AV fistula.  The patient had the radiocephalic AV fistula placed on 01/04/2020.  However it has had issues with maturation.  Currently the patient is maintained via a left radiocephalic AV fistula which has grown aneurysmal with signs of skin threatening.  However it continues to work appropriately.  He denies any fever, chills, nausea, vomiting or diarrhea.  He denies any chest pain or shortness of breath.  Today the patient underwent noninvasive studies which showed a flow volume of 630.  The patient had elevated velocities at the proximal forearm.  Compared to the previous study on 01/04/2020 the velocities at the proximal forearm were not nearly as elevated.  The patient also does have elevated velocities at the AV fistula anastomosis.  Past Medical History:  Diagnosis Date  . Anemia   . Blind   . BPH (benign prostatic hyperplasia)   . CAD (coronary artery disease)   . CHF (congestive heart failure) ([place])   . Chronic kidney disease (CKD), stage IV (severe) (HCC)    DIALYSIS  . Diabetes mellitus without complication ([place])   . ESRD (end stage renal disease) ([place])    Monday-Wednesday-Friday dialysis  . Hyperlipemia   . Hypertension   . Neuropathy   . Osteoporosis   . Pulmonary HTN ([place])   . Stroke [facility])    2013  . TIA (transient ischemic attack)   . TRD (traction retinal detachment)   . Wilms' tumor [facility])     Past Surgical History:  Procedure Laterality Date  . A/V FISTULAGRAM Left 01/28/2017   Procedure: A/V Fistulagram;  Surgeon: Algernon Huxley, MD;  Location: [place] [facility];  Service: Cardiovascular;  Laterality: Left;  . A/V FISTULAGRAM Left 04/28/2018   Procedure: A/V FISTULAGRAM;  Surgeon: Algernon Huxley, MD;   Location: [place] [facility];  Service: Cardiovascular;  Laterality: Left;  . AV FISTULA PLACEMENT    . AV FISTULA PLACEMENT Right 11/23/2019   Procedure: ARTERIOVENOUS (AV) FISTULA CREATION (RADIOCEPHALIC );  Surgeon: Algernon Huxley, MD;  Location: [facility]S;  Service: Vascular;  Laterality: Right;  . CARDIAC CATHETERIZATION    . CATARACT EXTRACTION W/PHACO Right 05/14/2016   Procedure: CATARACT EXTRACTION PHACO AND INTRAOCULAR LENS PLACEMENT (IOC);  Surgeon: Eulogio Bear, MD;  Location: [facility] ORS;  Service: Ophthalmology;  Laterality: Right;  [place] 1.46 AP% 11.5 CDE 12.33 FLUID PACK LOT # [id number]  . EYE SURGERY    . FRACTURE SURGERY     RIGHT LEG WITH ROD  . HIP SURGERY    . NEPHRECTOMY RADICAL    . PERIPHERAL VASCULAR CATHETERIZATION N/A 08/28/2015   Procedure: A/V Shuntogram/Fistulagram;  Surgeon: Algernon Huxley, MD;  Location: [place] [facility];  Service: Cardiovascular;  Laterality: N/A;  . PERIPHERAL VASCULAR CATHETERIZATION Left 08/28/2015   Procedure: A/V Shunt Intervention;  Surgeon: Algernon Huxley, MD;  Location: [place] [facility];  Service: Cardiovascular;  Laterality: Left;    Social History   Socioeconomic History  . Marital status: Married    Spouse name: Not on file  . Number of children: Not on file  . Years of education: Not on file  . Highest education level: Not on  file  Occupational History  . Not on file  Tobacco Use  . Smoking status: Former Smoker    Packs/day: 1.00    Types: Cigarettes    Quit date: 06/26/2012    Years since quitting: 7.7  . Smokeless tobacco: Never Used  Substance and Sexual Activity  . Alcohol use: No  . Drug use: No  . Sexual activity: Never  Other Topics Concern  . Not on file  Social History Narrative   Lives at home with his children. Ambulates well at baseline.   Social Determinants of Health   Financial Resource Strain:   . Difficulty of Paying Living Expenses:   Food Insecurity:   . Worried About Charity fundraiser in  the Last Year:   . Arboriculturist in the Last Year:   Transportation Needs:   . Film/video editor (Medical):   Marland Kitchen Lack of Transportation (Non-Medical):   Physical Activity:   . Days of Exercise per Week:   . Minutes of Exercise per Session:   Stress:   . Feeling of Stress :   Social Connections:   . Frequency of Communication with Friends and Family:   . Frequency of Social Gatherings with Friends and Family:   . Attends Religious Services:   . Active Member of Clubs or Organizations:   . Attends Archivist Meetings:   Marland Kitchen Marital Status:   Intimate Partner Violence:   . Fear of Current or Ex-Partner:   . Emotionally Abused:   Marland Kitchen Physically Abused:   . Sexually Abused:     Family History  Problem Relation Age of Onset  . CAD Father   . COPD Father   . CAD Paternal Grandfather   . Diabetes Maternal Aunt   . Diabetes Maternal Uncle     Allergies  Allergen Reactions  . Metformin Other (See Comments)    Severe diarrhea   . Penicillins Hives and Other (See Comments)    Has patient had a PCN reaction causing immediate rash, facial/tongue/throat swelling, SOB or lightheadedness with hypotension:Yes Has patient had a PCN reaction causing severe rash involving mucus membranes or skin necrosis:Yes Has patient had a PCN reaction that required hospitalization:inpatient at the time of reaction. Has patient had a PCN reaction occurring within the last 10 years:No If all of the above answers are "NO", then may proceed with Cephalosporin use.      Review of Systems   Review of Systems: Negative Unless Checked Constitutional: [] Weight loss  [] Fever  [] Chills Cardiac: [] Chest pain   []  Atrial Fibrillation  [] Palpitations   [] Shortness of breath when laying flat   [] Shortness of breath with exertion. [] Shortness of breath at rest Vascular:  [] Pain in legs with walking   [] Pain in legs with standing [] Pain in legs when laying flat   [] Claudication    [] Pain in feet when  laying flat    [] History of DVT   [] Phlebitis   [] Swelling in legs   [] Varicose veins   [] Non-healing ulcers Pulmonary:   [] Uses home oxygen   [] Productive cough   [] Hemoptysis   [] Wheeze  [x] COPD   [] Asthma Neurologic:  [] Dizziness   [] Seizures  [] Blackouts [x] History of stroke   [x] History of TIA  [] Aphasia   [] Temporary Blindness   [] Weakness or numbness in arm   [] Weakness or numbness in leg Musculoskeletal:   [] Joint swelling   [] Joint pain   [] Low back pain  []  History of Knee Replacement [x] Arthritis [] back Surgeries  []   Spinal Stenosis    Hematologic:  [] Easy bruising  [] Easy bleeding   [] Hypercoagulable state   [x] Anemic Gastrointestinal:  [] Diarrhea   [] Vomiting  [] Gastroesophageal reflux/heartburn   [] Difficulty swallowing. [] Abdominal pain Genitourinary:  [x] Chronic kidney disease   [] Difficult urination  [] Anuric   [] Blood in urine [] Frequent urination  [] Burning with urination   [] Hematuria Skin:  [] Rashes   [] Ulcers [] Wounds Psychological:  [] History of anxiety   []  History of major depression  []  Memory Difficulties      OBJECTIVE:   Physical Exam  BP (!) 162/60   Pulse 65   Ht 5\' 8"  (1.727 m)   Wt 143 lb 4.8 oz (65 kg)   BMI 21.79 kg/m   Gen: WD/WN, NAD Head: [place]/AT, No temporalis wasting.  Ear/Nose/Throat: Hearing grossly intact, nares w/o erythema or drainage Eyes: PER, EOMI, sclera nonicteric.  Neck: Supple, no masses.  No JVD.  Pulmonary:  Good air movement, no use of accessory muscles.  Cardiac: RRR Vascular:  Good thrill and bruit however fistula not extremely prominent Vessel Right Left  Radial Palpable Palpable   Gastrointestinal: soft, non-distended. No guarding/no peritoneal signs.  Musculoskeletal: M/S 5/5 throughout.  No deformity or atrophy.  Neurologic: Pain and light touch intact in extremities.  Symmetrical.  Speech is fluent. Motor exam as listed above. Psychiatric: Judgment intact, Mood & affect appropriate for pt's clinical situation.        ASSESSMENT AND PLAN:  1. End stage renal disease ([place]) Recommend:  The patient is experiencing increasing problems with their dialysis access.  Patient should have a fistulagram with the intention for intervention as well as possible assistance with fistula maturation.  The intention for intervention is to restore appropriate flow and prevent thrombosis and possible loss of the access.  As well as improve the quality of dialysis therapy.  The risks, benefits and alternative therapies were reviewed in detail with the patient.  All questions were answered.  The patient agrees to proceed with angio/intervention.      2. Essential hypertension Continue antihypertensive medications as already ordered, these medications have been reviewed and there are no changes at this time.   3. Mixed hyperlipidemia Continue statin as ordered and reviewed, no changes at this time    Current Outpatient Medications on File Prior to Visit  Medication Sig Dispense Refill  . amLODipine (NORVASC) 10 MG tablet Take 1 tablet (10 mg total) by mouth at bedtime. 30 tablet 0  . atorvastatin (LIPITOR) 80 MG tablet Take 1 tablet (80 mg total) by mouth daily. (Patient taking differently: Take 80 mg by mouth at bedtime. ) 30 tablet 2  . carvedilol (COREG) 12.5 MG tablet Take 37.5 mg by mouth 2 (two) times daily.    . clopidogrel (PLAVIX) 75 MG tablet Take 1 tablet (75 mg total) by mouth daily. (Patient taking differently: Take 75 mg by mouth See admin instructions. Take 75 mg by mouth daily on Saturday, Sunday, Tuesday and Thursday) 30 tablet 2  . furosemide (LASIX) 40 MG tablet Take 40 mg by mouth See admin instructions. Take 40 mg by mouth twice daily on Tuesday, Thursday, Saturday and Sunday.    . hydrALAZINE (APRESOLINE) 100 MG tablet Take 100 mg by mouth 3 (three) times daily.    Marland Kitchen HYDROcodone-acetaminophen (NORCO) 5-325 MG tablet Take 1 tablet by mouth every 6 (six) hours as needed for moderate pain. 30 tablet 0   . insulin glargine (LANTUS) 100 UNIT/ML injection Inject 9-10 Units into the skin daily.     Marland Kitchen  Insulin Pen Needle (RELION PEN NEEDLE 31G/8MM) 31G X 8 MM MISC USE AS DIRECTED ONCE DAILY WITH  LANTUS    . isosorbide mononitrate (IMDUR) 30 MG 24 hr tablet Take 1 tablet (30 mg total) by mouth daily. (Patient taking differently: Take 30 mg by mouth at bedtime. ) 30 tablet 0  . naproxen sodium (ANAPROX) 220 MG tablet Take 220 mg by mouth 2 (two) times daily as needed (for pain or headache).     . polyethylene glycol (GOLYTELY) 236 g solution Take by mouth.     No current facility-administered medications on file prior to visit.    There are no Patient Instructions on file for this visit. No follow-ups on file.   Kris Hartmann, NP  This note was completed with Sales executive.  Any errors are purely unintentional.

## 2020-03-12 ENCOUNTER — Other Ambulatory Visit: Admission: RE | Admit: 2020-03-12 | Payer: Medicare Other | Source: Ambulatory Visit

## 2020-03-13 ENCOUNTER — Encounter (INDEPENDENT_AMBULATORY_CARE_PROVIDER_SITE_OTHER): Payer: Self-pay

## 2020-03-13 NOTE — Telephone Encounter (Signed)
Spoke with Celeste at [facility] and the patient was rescheduled for his right arm fistulagram from 03/14/20 to 34/1/21 with a 6:45 am arrival time to the [facility]. Patient will do covid testing on 03/19/20 between 8-1 pm at the [facility]. Pre-procedure instructions were discussed and faxed to [facility] at [facility].

## 2020-03-17 ENCOUNTER — Other Ambulatory Visit (INDEPENDENT_AMBULATORY_CARE_PROVIDER_SITE_OTHER): Payer: Self-pay | Admitting: Nurse Practitioner

## 2020-03-19 ENCOUNTER — Other Ambulatory Visit
Admission: RE | Admit: 2020-03-19 | Discharge: 2020-03-19 | Disposition: A | Discharge status: Home / Self Care | Payer: Medicare Other | Source: Ambulatory Visit | Attending: Vascular Surgery | Admitting: Vascular Surgery

## 2020-03-19 ENCOUNTER — Other Ambulatory Visit: Payer: Self-pay

## 2020-03-19 DIAGNOSIS — Z20822 Contact with and (suspected) exposure to covid-19: Secondary | ICD-10-CM | POA: Insufficient documentation

## 2020-03-19 DIAGNOSIS — Z01812 Encounter for preprocedural laboratory examination: Secondary | ICD-10-CM | POA: Diagnosis present

## 2020-03-19 LAB — SARS CORONAVIRUS 2 (TAT 6-24 HRS): SARS Coronavirus 2: NEGATIVE

## 2020-03-20 ENCOUNTER — Other Ambulatory Visit (INDEPENDENT_AMBULATORY_CARE_PROVIDER_SITE_OTHER): Payer: Self-pay | Admitting: Nurse Practitioner

## 2020-03-20 MED ORDER — CLINDAMYCIN PHOSPHATE 300 MG/50ML IV SOLN: 300.0000 mg | Freq: Once | INTRAVENOUS | Status: DC

## 2020-03-21 ENCOUNTER — Other Ambulatory Visit: Payer: Self-pay

## 2020-03-21 ENCOUNTER — Encounter
Admission: RE | Disposition: A | Discharge status: Home / Self Care | Payer: Self-pay | Source: Home / Self Care | Attending: Vascular Surgery

## 2020-03-21 ENCOUNTER — Other Ambulatory Visit (INDEPENDENT_AMBULATORY_CARE_PROVIDER_SITE_OTHER): Payer: Self-pay | Admitting: Nurse Practitioner

## 2020-03-21 ENCOUNTER — Encounter: Payer: Self-pay | Admitting: Vascular Surgery

## 2020-03-21 ENCOUNTER — Ambulatory Visit
Admission: RE | Admit: 2020-03-21 | Discharge: 2020-03-21 | Disposition: A | Discharge status: Home / Self Care | Payer: Medicare Other | Attending: Vascular Surgery | Admitting: Vascular Surgery

## 2020-03-21 DIAGNOSIS — Z79899 Other long term (current) drug therapy: Secondary | ICD-10-CM | POA: Insufficient documentation

## 2020-03-21 DIAGNOSIS — T82590A Other mechanical complication of surgically created arteriovenous fistula, initial encounter: Secondary | ICD-10-CM | POA: Insufficient documentation

## 2020-03-21 DIAGNOSIS — Z888 Allergy status to other drugs, medicaments and biological substances status: Secondary | ICD-10-CM | POA: Insufficient documentation

## 2020-03-21 DIAGNOSIS — Z8673 Personal history of transient ischemic attack (TIA), and cerebral infarction without residual deficits: Secondary | ICD-10-CM | POA: Insufficient documentation

## 2020-03-21 DIAGNOSIS — Z905 Acquired absence of kidney: Secondary | ICD-10-CM | POA: Diagnosis not present

## 2020-03-21 DIAGNOSIS — E782 Mixed hyperlipidemia: Secondary | ICD-10-CM | POA: Diagnosis not present

## 2020-03-21 DIAGNOSIS — H547 Unspecified visual loss: Secondary | ICD-10-CM | POA: Diagnosis not present

## 2020-03-21 DIAGNOSIS — Z95828 Presence of other vascular implants and grafts: Secondary | ICD-10-CM | POA: Diagnosis not present

## 2020-03-21 DIAGNOSIS — Y832 Surgical operation with anastomosis, bypass or graft as the cause of abnormal reaction of the patient, or of later complication, without mention of misadventure at the time of the procedure: Secondary | ICD-10-CM | POA: Insufficient documentation

## 2020-03-21 DIAGNOSIS — N186 End stage renal disease: Secondary | ICD-10-CM

## 2020-03-21 DIAGNOSIS — E1140 Type 2 diabetes mellitus with diabetic neuropathy, unspecified: Secondary | ICD-10-CM | POA: Insufficient documentation

## 2020-03-21 DIAGNOSIS — Z87891 Personal history of nicotine dependence: Secondary | ICD-10-CM | POA: Diagnosis not present

## 2020-03-21 DIAGNOSIS — Z880 Allergy status to penicillin: Secondary | ICD-10-CM | POA: Diagnosis not present

## 2020-03-21 DIAGNOSIS — E1122 Type 2 diabetes mellitus with diabetic chronic kidney disease: Secondary | ICD-10-CM | POA: Diagnosis not present

## 2020-03-21 DIAGNOSIS — Z833 Family history of diabetes mellitus: Secondary | ICD-10-CM | POA: Insufficient documentation

## 2020-03-21 DIAGNOSIS — I509 Heart failure, unspecified: Secondary | ICD-10-CM | POA: Insufficient documentation

## 2020-03-21 DIAGNOSIS — I130 Hypertensive heart and chronic kidney disease with heart failure and stage 1 through stage 4 chronic kidney disease, or unspecified chronic kidney disease: Secondary | ICD-10-CM | POA: Insufficient documentation

## 2020-03-21 DIAGNOSIS — Z794 Long term (current) use of insulin: Secondary | ICD-10-CM | POA: Diagnosis not present

## 2020-03-21 DIAGNOSIS — T82898A Other specified complication of vascular prosthetic devices, implants and grafts, initial encounter: Secondary | ICD-10-CM

## 2020-03-21 DIAGNOSIS — I272 Pulmonary hypertension, unspecified: Secondary | ICD-10-CM | POA: Diagnosis not present

## 2020-03-21 DIAGNOSIS — Z992 Dependence on renal dialysis: Secondary | ICD-10-CM

## 2020-03-21 DIAGNOSIS — Z7902 Long term (current) use of antithrombotics/antiplatelets: Secondary | ICD-10-CM | POA: Insufficient documentation

## 2020-03-21 DIAGNOSIS — Z8249 Family history of ischemic heart disease and other diseases of the circulatory system: Secondary | ICD-10-CM | POA: Diagnosis not present

## 2020-03-21 HISTORY — PX: A/V FISTULAGRAM: CATH118298

## 2020-03-21 LAB — GLUCOSE, CAPILLARY: Glucose-Capillary: 79 mg/dL (ref 70–99)

## 2020-03-21 LAB — POTASSIUM (ARMC VASCULAR LAB ONLY): Potassium (ARMC vascular lab): 3.8 (ref 3.5–5.1)

## 2020-03-21 SURGERY — A/V FISTULAGRAM
Anesthesia: Moderate Sedation

## 2020-03-21 MED ORDER — HEPARIN SODIUM (PORCINE) 1000 UNIT/ML IJ SOLN
INTRAMUSCULAR | Status: DC | PRN
  Administered 2020-03-21: 3000 [IU] via INTRAVENOUS

## 2020-03-21 MED ORDER — MIDAZOLAM HCL 5 MG/5ML IJ SOLN
INTRAMUSCULAR | Status: AC
  Filled 2020-03-21: qty 5

## 2020-03-21 MED ORDER — HEPARIN SODIUM (PORCINE) 1000 UNIT/ML IJ SOLN
INTRAMUSCULAR | Status: AC
  Filled 2020-03-21: qty 1

## 2020-03-21 MED ORDER — ONDANSETRON HCL 4 MG/2ML IJ SOLN: 4.0000 mg | Freq: Four times a day (QID) | INTRAMUSCULAR | Status: DC | PRN

## 2020-03-21 MED ORDER — DIPHENHYDRAMINE HCL 50 MG/ML IJ SOLN: 50.0000 mg | Freq: Once | INTRAMUSCULAR | Status: DC | PRN

## 2020-03-21 MED ORDER — FENTANYL CITRATE (PF) 100 MCG/2ML IJ SOLN
INTRAMUSCULAR | Status: DC | PRN
  Administered 2020-03-21: 50 ug via INTRAVENOUS

## 2020-03-21 MED ORDER — FENTANYL CITRATE (PF) 100 MCG/2ML IJ SOLN
INTRAMUSCULAR | Status: AC
  Filled 2020-03-21: qty 2

## 2020-03-21 MED ORDER — FAMOTIDINE 20 MG PO TABS: 40.0000 mg | ORAL_TABLET | Freq: Once | ORAL | Status: DC | PRN

## 2020-03-21 MED ORDER — CLINDAMYCIN PHOSPHATE 300 MG/50ML IV SOLN
INTRAVENOUS | Status: AC
  Filled 2020-03-21: qty 50

## 2020-03-21 MED ORDER — LIDOCAINE-EPINEPHRINE (PF) 1 %-1:200000 IJ SOLN
INTRAMUSCULAR | Status: AC
  Filled 2020-03-21: qty 10

## 2020-03-21 MED ORDER — MIDAZOLAM HCL 2 MG/ML PO SYRP: 8.0000 mg | ORAL_SOLUTION | Freq: Once | ORAL | Status: DC | PRN

## 2020-03-21 MED ORDER — IODIXANOL 320 MG/ML IV SOLN
INTRAVENOUS | Status: DC | PRN
  Administered 2020-03-21: 09:00:00 40 mL via INTRAVENOUS

## 2020-03-21 MED ORDER — SODIUM CHLORIDE 0.9 % IV SOLN: INTRAVENOUS | Status: DC

## 2020-03-21 MED ORDER — METHYLPREDNISOLONE SODIUM SUCC 125 MG IJ SOLR: 125.0000 mg | Freq: Once | INTRAMUSCULAR | Status: DC | PRN

## 2020-03-21 MED ORDER — MIDAZOLAM HCL 2 MG/2ML IJ SOLN
INTRAMUSCULAR | Status: DC | PRN
  Administered 2020-03-21: 2 mg via INTRAVENOUS
  Administered 2020-03-21: 1 mg via INTRAVENOUS

## 2020-03-21 MED ORDER — HYDROMORPHONE HCL 1 MG/ML IJ SOLN: 1.0000 mg | Freq: Once | INTRAMUSCULAR | Status: DC | PRN

## 2020-03-21 SURGICAL SUPPLY — 18 items
BALLN LUTONIX DCB 7X60X130 (BALLOONS)
BALLOON LUTONIX DCB 7X60X130 (BALLOONS) IMPLANT
CANNULA 5F STIFF (CANNULA) ×4 IMPLANT
CATH BEACON 5 .035 40 KMP TP (CATHETERS) IMPLANT
CATH BEACON 5 .038 40 KMP TP (CATHETERS) ×2
CATH MICROCATH PRGRT 2.8F 110 (CATHETERS) IMPLANT
COIL 400 COMPLEX SOFT 3X15CM (Vascular Products) ×2 IMPLANT
COIL 400 COMPLEX SOFT 3X5CM (Vascular Products) ×2 IMPLANT
COIL 400 COMPLEX STD 5X12CM (Vascular Products) ×4 IMPLANT
COIL 400 COMPLEX STD 5X20CM (Vascular Products) ×2 IMPLANT
DRAPE BRACHIAL (DRAPES) ×2 IMPLANT
GLIDEWIRE STIFF .35X180X3 HYDR (WIRE) ×2 IMPLANT
HANDLE DETACHMENT COIL (MISCELLANEOUS) ×2 IMPLANT
MICROCATH PROGREAT 2.8F 110 CM (CATHETERS) ×3
PACK ANGIOGRAPHY (CUSTOM PROCEDURE TRAY) ×3 IMPLANT
SHEATH BRITE TIP 6FRX5.5 (SHEATH) ×2 IMPLANT
SUT MNCRL AB 4-0 PS2 18 (SUTURE) ×2 IMPLANT
WIRE MAGIC TOR.035 180C (WIRE) ×2 IMPLANT

## 2020-03-21 NOTE — Op Note (Signed)
[facility]    OPERATIVE NOTE   PROCEDURE: 1.   Right radiocephalic arteriovenous fistula cannulation under ultrasound guidance 2.   Right arm fistulagram including central venogram 3.   Coil embolization of 2 separate forearm cephalic vein branches.  The more proximal forearm cephalic vein branch with a 3 mm diameter by 15 cm and a 3 mm diameter by 5 cm Ruby coil.  The more distal forearm cephalic vein branch with a total of 3 Ruby coils, 5 mm in diameter for each.  PRE-OPERATIVE DIAGNOSIS: 1. ESRD 2. Poorly functional right radiocephalic AVF  POST-OPERATIVE DIAGNOSIS: same as above   SURGEON: [name], MD  ANESTHESIA: local with MCS  ESTIMATED BLOOD LOSS: 3 cc  FINDING(S): 1. Radiocephalic fistula was patent until the proximal forearm where there was a large branch that then fed the basilic vein as the outflow in the upper arm.  To the upper arm basilic vein was widely patent as was the central venous circulation.  There were 2 large competing branches 1 in the proximal forearm and one in the distal forearm off of the cephalic vein that were limiting maturation.  SPECIMEN(S):  None  CONTRAST: 40 cc  FLUORO TIME: 8.6 minutes  MODERATE CONSCIOUS SEDATION TIME: Approximately 40 minutes with 3 mg of Versed and 50 mcg of Fentanyl   INDICATIONS: Richard Zavala is a 59 y.o. male who presents with malfunctioning nonmaturing right radiocephalic arteriovenous fistula.  The patient is scheduled for right arm fistulagram.  The patient is aware the risks include but are not limited to: bleeding, infection, thrombosis of the cannulated access, and possible anaphylactic reaction to the contrast.  The patient is aware of the risks of the procedure and elects to proceed forward.  DESCRIPTION: After full informed written consent was obtained, the patient was brought back to the angiography suite and placed supine upon the angiography table.  The patient was connected to  monitoring equipment. Moderate conscious sedation was administered with a face to face encounter with the patient throughout the procedure with my supervision of the RN administering medicines and monitoring the patient's vital signs and mental status throughout from the start of the procedure until the patient was taken to the recovery room. The right arm was prepped and draped in the standard fashion for a percutaneous access intervention.  Under ultrasound guidance, the right radiocephalic arteriovenous fistula was cannulated with a micropuncture needle under direct ultrasound guidance where it was patent near the anastomosis and a permanent image was performed.  The microwire was advanced into the fistula and the needle was exchanged for the a microsheath.  I then upsized to a 6 Fr Sheath and imaging was performed.  Hand injections were completed to image the access including the central venous system. This demonstrated that the radiocephalic fistula was patent until the proximal forearm where there was a large branch that then fed the basilic vein as the outflow in the upper arm.  To the upper arm basilic vein was widely patent as was the central venous circulation.  There were 2 large competing branches 1 in the proximal forearm and one in the distal forearm off of the cephalic vein that were limiting maturation.  Based on the images, this patient will need embolization of the competing cephalic vein branches to try to improve the flow through the fistula. I then gave the patient 3000 units of intravenous heparin. Using a Kumpe catheter and then a prograde microcatheter I navigated out the  more proximal forearm cephalic vein branch that was competing.  After I confirmed I was not in the true outflow of the fistula, I performed coil embolization with a 3 mm diameter by 15 cm length and a 3 mm diameter by 5 cm length soft Ruby coil successfully embolizing this competing branch.  I then turned my attention to  the larger competing branch in the distal forearm.  Again, using the Kumpe catheter and the progreat microcatheter I was able to get out into this branch.  It split shortly after its origin and to a proximal going in a distal going branch.  This was a little larger.  The distal going branch was cannulated first with a prograde microcatheter and a pair of 5 mm diameter by 12 cm length standard Ruby coils were used to coil embolize this portion of the branch.  The progreat microcatheter was then pulled back and used to cannulate the proximal going portion of this branch.  After confirming successful placement.  A 5 mm diameter by 20 cm long standard coil was used to embolize this branch.  The progreat microcatheter and the Kumpe catheter were then removed and imaging showed excellent coil embolization of these branches with good flow through the fistula. Based on the completion imaging, no further intervention is necessary.  The wire and balloon were removed from the sheath.  A 4-0 Monocryl purse-string suture was sewn around the sheath.  The sheath was removed while tying down the suture.  A sterile bandage was applied to the puncture site.  COMPLICATIONS: None  CONDITION: Stable   [name]  03/21/2020 9:31 AM   This note was created with Dragon Medical transcription system. Any errors in dictation are purely unintentional.

## 2020-03-21 NOTE — Discharge Instructions (Signed)
Moderate Conscious Sedation, Adult, Care After These instructions provide you with information about caring for yourself after your procedure. Your health care provider may also give you more specific instructions. Your treatment has been planned according to current medical practices, but problems sometimes occur. Call your health care provider if you have any problems or questions after your procedure. What can I expect after the procedure? After your procedure, it is common:  To feel sleepy for several hours.  To feel clumsy and have poor balance for several hours.  To have poor judgment for several hours.  To vomit if you eat too soon. Follow these instructions at home: For at least 24 hours after the procedure:   Do not: ? Participate in activities where you could fall or become injured. ? Drive. ? Use heavy machinery. ? Drink alcohol. ? Take sleeping pills or medicines that cause drowsiness. ? Make important decisions or sign legal documents. ? Take care of children on your own.  Rest. Eating and drinking  Follow the diet recommended by your health care provider.  If you vomit: ? Drink water, juice, or soup when you can drink without vomiting. ? Make sure you have little or no nausea before eating solid foods. General instructions  Have a responsible adult stay with you until you are awake and alert.  Take over-the-counter and prescription medicines only as told by your health care provider.  If you smoke, do not smoke without supervision.  Keep all follow-up visits as told by your health care provider. This is important. Contact a health care provider if:  You keep feeling nauseous or you keep vomiting.  You feel light-headed.  You develop a rash.  You have a fever. Get help right away if:  You have trouble breathing. This information is not intended to replace advice given to you by your health care provider. Make sure you discuss any questions you have  with your health care provider. Document Revised: 11/19/2017 Document Reviewed: 03/28/2016 Elsevier Patient Education  2020 Elsevier Inc. Dialysis Fistulogram, Care After This sheet gives you information about how to care for yourself after your procedure. Your health care provider may also give you more specific instructions. If you have problems or questions, contact your health care provider. What can I expect after the procedure? After the procedure, it is common to have:  A small amount of discomfort in the area where the small, thin tube (catheter) was placed for the procedure.  A small amount of bruising around the fistula.  Sleepiness and tiredness (fatigue). Follow these instructions at home: Activity   Rest at home and do not lift anything that is heavier than 5 lb (2.3 kg) on the day after your procedure.  Return to your normal activities as told by your health care provider. Ask your health care provider what activities are safe for you.  Do not drive or use heavy machinery while taking prescription pain medicine.  Do not drive for 24 hours if you were given a medicine to help you relax (sedative) during your procedure. Medicines   Take over-the-counter and prescription medicines only as told by your health care provider. Puncture site care  Follow instructions from your health care provider about how to take care of the site where catheters were inserted. Make sure you: ? Wash your hands with soap and water before you change your bandage (dressing). If soap and water are not available, use hand sanitizer. ? Change your dressing as told by your health   care provider. ? Leave stitches (sutures), skin glue, or adhesive strips in place. These skin closures may need to stay in place for 2 weeks or longer. If adhesive strip edges start to loosen and curl up, you may trim the loose edges. Do not remove adhesive strips completely unless your health care provider tells you to do  that.  Check your puncture area every day for signs of infection. Check for: ? Redness, swelling, or pain. ? Fluid or blood. ? Warmth. ? Pus or a bad smell. General instructions  Do not take baths, swim, or use a hot tub until your health care provider approves. Ask your health care provider if you may take showers. You may only be allowed to take sponge baths.  Monitor your dialysis fistula closely. Check to make sure that you can feel a vibration or buzz (a thrill) when you put your fingers over the fistula.  Prevent damage to your graft or fistula: ? Do not wear tight-fitting clothing or jewelry on the arm or leg that has your graft or fistula. ? Tell all your health care providers that you have a dialysis fistula or graft. ? Do not allow blood draws, IVs, or blood pressure readings to be done in the arm that has your fistula or graft. ? Do not allow flu shots or vaccinations in the arm with your fistula or graft.  Keep all follow-up visits as told by your health care provider. This is important. Contact a health care provider if:  You have redness, swelling, or pain at the site where the catheter was put in.  You have fluid or blood coming from the catheter site.  The catheter site feels warm to the touch.  You have pus or a bad smell coming from the catheter site.  You have a fever or chills. Get help right away if:  You feel weak.  You have trouble balancing.  You have trouble moving your arms or legs.  You have problems with your speech or vision.  You can no longer feel a vibration or buzz when you put your fingers over your dialysis fistula.  The limb that was used for the procedure: ? Swells. ? Is painful. ? Is cold. ? Is discolored, such as blue or pale white.  You have chest pain or shortness of breath. Summary  After a dialysis fistulogram, it is common to have a small amount of discomfort or bruising in the area where the small, thin tube (catheter)  was placed.  Rest at home on the day after your procedure. Return to your normal activities as told by your health care provider.  Take over-the-counter and prescription medicines only as told by your health care provider.  Follow instructions from your health care provider about how to take care of the site where the catheter was inserted.  Keep all follow-up visits as told by your health care provider. This information is not intended to replace advice given to you by your health care provider. Make sure you discuss any questions you have with your health care provider. Document Revised: 01/07/2018 Document Reviewed: 01/07/2018 Elsevier Patient Education  2020 Elsevier Inc.  

## 2020-03-21 NOTE — H&P (Signed)
[facility] History & Physical Update  The patient was interviewed and re-examined.  The patient's previous History and Physical has been reviewed and is unchanged.  There is no change in the plan of care. We plan to proceed with the scheduled procedure.  [name], MD  03/21/2020, 7:27 AM

## 2020-03-27 ENCOUNTER — Telehealth (INDEPENDENT_AMBULATORY_CARE_PROVIDER_SITE_OTHER): Payer: Self-pay | Admitting: Vascular Surgery

## 2020-03-27 NOTE — Telephone Encounter (Signed)
No notes left in discharge summary.  Per Eulogio Ditch. Bring patient in 4-6wks with an HDA. See JD/FB

## 2020-04-16 ENCOUNTER — Other Ambulatory Visit (INDEPENDENT_AMBULATORY_CARE_PROVIDER_SITE_OTHER): Payer: Self-pay | Admitting: Vascular Surgery

## 2020-04-16 DIAGNOSIS — T829XXS Unspecified complication of cardiac and vascular prosthetic device, implant and graft, sequela: Secondary | ICD-10-CM

## 2020-04-16 DIAGNOSIS — N186 End stage renal disease: Secondary | ICD-10-CM

## 2020-04-23 ENCOUNTER — Ambulatory Visit (INDEPENDENT_AMBULATORY_CARE_PROVIDER_SITE_OTHER): Payer: Medicare Other

## 2020-04-23 ENCOUNTER — Ambulatory Visit (INDEPENDENT_AMBULATORY_CARE_PROVIDER_SITE_OTHER): Payer: Medicare Other | Admitting: Nurse Practitioner

## 2020-04-23 ENCOUNTER — Other Ambulatory Visit: Payer: Self-pay

## 2020-04-23 ENCOUNTER — Encounter (INDEPENDENT_AMBULATORY_CARE_PROVIDER_SITE_OTHER): Payer: Self-pay | Admitting: Nurse Practitioner

## 2020-04-23 VITALS — BP 126/83 | HR 66 | Resp 16 | Ht 68.0 in | Wt 166.0 lb

## 2020-04-23 DIAGNOSIS — I10 Essential (primary) hypertension: Secondary | ICD-10-CM | POA: Diagnosis not present

## 2020-04-23 DIAGNOSIS — T829XXS Unspecified complication of cardiac and vascular prosthetic device, implant and graft, sequela: Secondary | ICD-10-CM

## 2020-04-23 DIAGNOSIS — N186 End stage renal disease: Secondary | ICD-10-CM

## 2020-04-23 DIAGNOSIS — E782 Mixed hyperlipidemia: Secondary | ICD-10-CM

## 2020-04-24 ENCOUNTER — Encounter (INDEPENDENT_AMBULATORY_CARE_PROVIDER_SITE_OTHER): Payer: Self-pay | Admitting: Nurse Practitioner

## 2020-04-24 NOTE — Progress Notes (Signed)
Subjective:    Patient ID: Richard Zavala, male    DOB: 1961/04/23, 59 y.o.   MRN: [id number] Chief Complaint  Patient presents with  . Follow-up    ultrasound    The patient returns to the office for followup status post intervention of the dialysis access right radiocephalic AV fistula.  On 03/21/2020 the patient underwent embolization and coiling of 2 forearm branches of the cephalic vein.  This was to help his new access which are faster.  The patient also has a left radiocephalic AV fistula that is currently being used for dialysis access.  Today, the patient right radiocephalic AV fistula is much more palpable but still not extremely prominent in the arm.  The patient denies an increase in arm swelling. At the present time the patient denies hand pain.  The patient denies amaurosis fugax or recent TIA symptoms. There are no recent neurological changes noted. The patient denies claudication symptoms or rest pain symptoms. The patient denies history of DVT, PE or superficial thrombophlebitis. The patient denies recent episodes of angina or shortness of breath.    Today the patient has a flow volume of 877.  At the radiocephalic AV fistula is patent however the antecubital fossa shows flow splitting between the perforator as well as the basilic vein.  The cephalic vein is occluded at the antecubital fossa with no flow detected in the upper arm cephalic vein.     Review of Systems  Musculoskeletal: Positive for gait problem.  All other systems reviewed and are negative.      Objective:   Physical Exam Vitals reviewed.  Constitutional:      Appearance: Normal appearance.  HENT:     Head: Normocephalic.  Cardiovascular:     Rate and Rhythm: Normal rate and regular rhythm.     Pulses: Normal pulses.          Radial pulses are 2+ on the right side and 2+ on the left side.     Heart sounds: Normal heart sounds.     Arteriovenous access: right and left arteriovenous access is  present.    Comments: Right radiocephalic AV fistula with left radiocephalic AV fistula that is very aneurysmal Pulmonary:     Effort: Pulmonary effort is normal.     Breath sounds: Normal breath sounds.  Skin:    Capillary Refill: Capillary refill takes less than 2 seconds.  Neurological:     Mental Status: He is alert and oriented to person, place, and time.  Psychiatric:        Mood and Affect: Mood normal.        Thought Content: Thought content normal.        Judgment: Judgment normal.     BP 126/83 (BP Location: Right Arm)   Pulse 66   Resp 16   Ht 5\' 8"  (1.727 m)   Wt 166 lb (75.3 kg)   BMI 25.24 kg/m   Past Medical History:  Diagnosis Date  . Anemia   . Blind   . BPH (benign prostatic hyperplasia)   . CAD (coronary artery disease)   . CHF (congestive heart failure) ([place])   . Chronic kidney disease (CKD), stage IV (severe) (HCC)    DIALYSIS  . Diabetes mellitus without complication ([place])   . ESRD (end stage renal disease) ([place])    Monday-Wednesday-Friday dialysis  . Hyperlipemia   . Hypertension   . Neuropathy   . Osteoporosis   . Pulmonary HTN ([place])   .  Stroke [facility])    2013  . TIA (transient ischemic attack)   . TRD (traction retinal detachment)   . Wilms' tumor [facility])     Social History   Socioeconomic History  . Marital status: Divorced    Spouse name: Not on file  . Number of children: Not on file  . Years of education: Not on file  . Highest education level: Not on file  Occupational History  . Not on file  Tobacco Use  . Smoking status: Former Smoker    Packs/day: 1.00    Types: Cigarettes    Quit date: 06/26/2012    Years since quitting: 7.8  . Smokeless tobacco: Never Used  Substance and Sexual Activity  . Alcohol use: No  . Drug use: No  . Sexual activity: Never  Other Topics Concern  . Not on file  Social History Narrative   Lives at home with his son. Ambulates well at baseline.   Social Determinants of Health   Financial  Resource Strain:   . Difficulty of Paying Living Expenses:   Food Insecurity:   . Worried About Charity fundraiser in the Last Year:   . Arboriculturist in the Last Year:   Transportation Needs:   . Film/video editor (Medical):   Marland Kitchen Lack of Transportation (Non-Medical):   Physical Activity:   . Days of Exercise per Week:   . Minutes of Exercise per Session:   Stress:   . Feeling of Stress :   Social Connections:   . Frequency of Communication with Friends and Family:   . Frequency of Social Gatherings with Friends and Family:   . Attends Religious Services:   . Active Member of Clubs or Organizations:   . Attends Archivist Meetings:   Marland Kitchen Marital Status:   Intimate Partner Violence:   . Fear of Current or Ex-Partner:   . Emotionally Abused:   Marland Kitchen Physically Abused:   . Sexually Abused:     Past Surgical History:  Procedure Laterality Date  . A/V FISTULAGRAM Left 01/28/2017   Procedure: A/V Fistulagram;  Surgeon: Algernon Huxley, MD;  Location: [place] [facility];  Service: Cardiovascular;  Laterality: Left;  . A/V FISTULAGRAM Left 04/28/2018   Procedure: A/V FISTULAGRAM;  Surgeon: Algernon Huxley, MD;  Location: [place] [facility];  Service: Cardiovascular;  Laterality: Left;  . A/V FISTULAGRAM N/A 03/21/2020   Procedure: A/V FISTULAGRAM;  Surgeon: Algernon Huxley, MD;  Location: [place] [facility];  Service: Cardiovascular;  Laterality: N/A;  . AV FISTULA PLACEMENT    . AV FISTULA PLACEMENT Right 11/23/2019   Procedure: ARTERIOVENOUS (AV) FISTULA CREATION (RADIOCEPHALIC );  Surgeon: Algernon Huxley, MD;  Location: [facility];  Service: Vascular;  Laterality: Right;  . CARDIAC CATHETERIZATION    . CATARACT EXTRACTION W/PHACO Right 05/14/2016   Procedure: CATARACT EXTRACTION PHACO AND INTRAOCULAR LENS PLACEMENT (IOC);  Surgeon: Eulogio Bear, MD;  Location: [facility];  Service: Ophthalmology;  Laterality: Right;  [place] 1.46 AP% 11.5 CDE 12.33 FLUID PACK LOT # [id number] H  . EYE  SURGERY    . FRACTURE SURGERY     RIGHT LEG WITH ROD  . HIP SURGERY    . NEPHRECTOMY RADICAL    . PERIPHERAL VASCULAR CATHETERIZATION N/A 08/28/2015   Procedure: A/V Shuntogram/Fistulagram;  Surgeon: Algernon Huxley, MD;  Location: [place] [facility];  Service: Cardiovascular;  Laterality: N/A;  . PERIPHERAL VASCULAR CATHETERIZATION Left 08/28/2015   Procedure:  A/V Shunt Intervention;  Surgeon: Algernon Huxley, MD;  Location: [place] [facility];  Service: Cardiovascular;  Laterality: Left;    Family History  Problem Relation Age of Onset  . CAD Father   . COPD Father   . CAD Paternal Grandfather   . Diabetes Maternal Aunt   . Diabetes Maternal Uncle     Allergies  Allergen Reactions  . Metformin Other (See Comments)    Severe diarrhea   . Penicillins Hives and Other (See Comments)    Has patient had a PCN reaction causing immediate rash, facial/tongue/throat swelling, SOB or lightheadedness with hypotension:Yes Has patient had a PCN reaction causing severe rash involving mucus membranes or skin necrosis:Yes Has patient had a PCN reaction that required hospitalization:inpatient at the time of reaction. Has patient had a PCN reaction occurring within the last 10 years:No If all of the above answers are "NO", then may proceed with Cephalosporin use.        Assessment & Plan:   1. End stage renal disease ([place]) Today the patient's radiocephalic fistula is more palpable however it is still not extremely visible.  We will allow the dialysis center to begin to try to utilizing his new fistula.  If they are able to do so without issues then no further action will be necessary.  However, if they are running dialysis and are encountering issues with it or just having continued difficulty with cannulation then we will likely plan for a jump graft insertion for new access creation.  If there are no other issues, we will have the patient follow-up in the office in 6 months with noninvasive  studies. 2. Mixed hyperlipidemia Continue statin as ordered and reviewed, no changes at this time   3. Essential hypertension Continue antihypertensive medications as already ordered, these medications have been reviewed and there are no changes at this time.    Current Outpatient Medications on File Prior to Visit  Medication Sig Dispense Refill  . amLODipine (NORVASC) 10 MG tablet Take 1 tablet (10 mg total) by mouth at bedtime. 30 tablet 0  . atorvastatin (LIPITOR) 80 MG tablet Take 1 tablet (80 mg total) by mouth daily. (Patient taking differently: Take 80 mg by mouth at bedtime. ) 30 tablet 2  . carvedilol (COREG) 12.5 MG tablet Take 37.5 mg by mouth 2 (two) times daily.    . clopidogrel (PLAVIX) 75 MG tablet Take 1 tablet (75 mg total) by mouth daily. (Patient taking differently: Take 75 mg by mouth See admin instructions. Take 75 mg by mouth daily on Saturday, Sunday, Tuesday and Thursday) 30 tablet 2  . furosemide (LASIX) 40 MG tablet Take 40 mg by mouth See admin instructions. Take 40 mg by mouth twice daily on Tuesday, Thursday, Saturday and Sunday.    . hydrALAZINE (APRESOLINE) 100 MG tablet Take 100 mg by mouth 3 (three) times daily.    . insulin glargine (LANTUS) 100 UNIT/ML injection Inject 9-10 Units into the skin daily.     . Insulin Pen Needle (RELION PEN NEEDLE 31G/8MM) 31G X 8 MM MISC USE AS DIRECTED ONCE DAILY WITH  LANTUS    . isosorbide mononitrate (IMDUR) 30 MG 24 hr tablet Take 1 tablet (30 mg total) by mouth daily. (Patient taking differently: Take 30 mg by mouth at bedtime. ) 30 tablet 0  . HYDROcodone-acetaminophen (NORCO) 5-325 MG tablet Take 1 tablet by mouth every 6 (six) hours as needed for moderate pain. (Patient not taking: Reported on 03/21/2020) 30 tablet  0  . naproxen sodium (ANAPROX) 220 MG tablet Take 220 mg by mouth 2 (two) times daily as needed (for pain or headache).     . polyethylene glycol (GOLYTELY) 236 g solution Take by mouth.     No current  facility-administered medications on file prior to visit.    There are no Patient Instructions on file for this visit. No follow-ups on file.   Kris Hartmann, NP

## 2020-07-22 ENCOUNTER — Other Ambulatory Visit (INDEPENDENT_AMBULATORY_CARE_PROVIDER_SITE_OTHER): Payer: Self-pay | Admitting: Nurse Practitioner

## 2020-07-22 DIAGNOSIS — N186 End stage renal disease: Secondary | ICD-10-CM

## 2020-07-23 ENCOUNTER — Encounter (INDEPENDENT_AMBULATORY_CARE_PROVIDER_SITE_OTHER): Payer: Self-pay | Admitting: Nurse Practitioner

## 2020-07-23 ENCOUNTER — Ambulatory Visit (INDEPENDENT_AMBULATORY_CARE_PROVIDER_SITE_OTHER): Payer: Medicare Other

## 2020-07-23 ENCOUNTER — Other Ambulatory Visit: Payer: Self-pay

## 2020-07-23 ENCOUNTER — Ambulatory Visit (INDEPENDENT_AMBULATORY_CARE_PROVIDER_SITE_OTHER): Payer: Medicare Other | Admitting: Nurse Practitioner

## 2020-07-23 VITALS — BP 134/85 | HR 61 | Resp 16

## 2020-07-23 DIAGNOSIS — N186 End stage renal disease: Secondary | ICD-10-CM

## 2020-07-23 DIAGNOSIS — I10 Essential (primary) hypertension: Secondary | ICD-10-CM | POA: Diagnosis not present

## 2020-07-23 DIAGNOSIS — E782 Mixed hyperlipidemia: Secondary | ICD-10-CM | POA: Diagnosis not present

## 2020-07-23 NOTE — Progress Notes (Signed)
Subjective:    Patient ID: Richard Zavala, male    DOB: 03-Jan-1961, 59 y.o.   MRN: [id number] Chief Complaint  Patient presents with  . Follow-up    ultrasound follow up    The patient returns to the office for followup of their dialysis access. The patient has a right radiocephalic AV fistula placed on 03/21/2020. The function of the access has been stable. The patient denies increased bleeding time or increased recirculation. Patient denies difficulty with cannulation, however he recently had an infiltration. The patient denies classic still syndrome symptoms however he has been noticing numbness of his thumb and pinky recently with more frequency is slightly increased with dialysis.  No significant arm swelling.  The patient denies redness or swelling at the access site. The patient denies fever or chills at home or while on dialysis.  The patient denies amaurosis fugax or recent TIA symptoms. There are no recent neurological changes noted. The patient denies claudication symptoms or rest pain symptoms. The patient denies history of DVT, PE or superficial thrombophlebitis. The patient denies recent episodes of angina or shortness of breath.     Today noninvasive studies show flow volume of 925. The right radial artery wrist level displays retrograde flow and with compression the anastomosis site flows are reserved suggesting still syndrome. The patient also has a significant stenosis in the distal forearm area.   Review of Systems  Musculoskeletal: Positive for gait problem.  Neurological: Positive for speech difficulty and weakness.  All other systems reviewed and are negative.      Objective:   Physical Exam Vitals reviewed.  HENT:     Head: Normocephalic.  Cardiovascular:     Rate and Rhythm: Normal rate and regular rhythm.     Pulses: Normal pulses.          Radial pulses are 2+ on the left side.     Heart sounds: Normal heart sounds.     Arteriovenous access: right  arteriovenous access is present.    Comments: Good thrill and bruit Pulmonary:     Effort: Pulmonary effort is normal.     Breath sounds: Normal breath sounds.  Neurological:     Mental Status: He is alert and oriented to person, place, and time. Mental status is at baseline.     Motor: Weakness present.     Gait: Gait abnormal.  Psychiatric:        Mood and Affect: Mood normal.        Behavior: Behavior normal.        Thought Content: Thought content normal.        Judgment: Judgment normal.     BP 134/85 (BP Location: Right Leg)   Pulse 61   Resp 16   Past Medical History:  Diagnosis Date  . Anemia   . Blind   . BPH (benign prostatic hyperplasia)   . CAD (coronary artery disease)   . CHF (congestive heart failure) ([place])   . Chronic kidney disease (CKD), stage IV (severe) (HCC)    DIALYSIS  . Diabetes mellitus without complication ([place])   . ESRD (end stage renal disease) ([place])    Monday-Wednesday-Friday dialysis  . Hyperlipemia   . Hypertension   . Neuropathy   . Osteoporosis   . Pulmonary HTN ([place])   . Stroke [facility])    2013  . TIA (transient ischemic attack)   . TRD (traction retinal detachment)   . Wilms' tumor [facility])     Social History  Socioeconomic History  . Marital status: Divorced    Spouse name: Not on file  . Number of children: Not on file  . Years of education: Not on file  . Highest education level: Not on file  Occupational History  . Not on file  Tobacco Use  . Smoking status: Former Smoker    Packs/day: 1.00    Types: Cigarettes    Quit date: 06/26/2012    Years since quitting: 8.0  . Smokeless tobacco: Never Used  Vaping Use  . Vaping Use: Never used  Substance and Sexual Activity  . Alcohol use: No  . Drug use: No  . Sexual activity: Never  Other Topics Concern  . Not on file  Social History Narrative   Lives at home with his son. Ambulates well at baseline.   Social Determinants of Health   Financial Resource Strain:   .  Difficulty of Paying Living Expenses:   Food Insecurity:   . Worried About Charity fundraiser in the Last Year:   . Arboriculturist in the Last Year:   Transportation Needs:   . Film/video editor (Medical):   Marland Kitchen Lack of Transportation (Non-Medical):   Physical Activity:   . Days of Exercise per Week:   . Minutes of Exercise per Session:   Stress:   . Feeling of Stress :   Social Connections:   . Frequency of Communication with Friends and Family:   . Frequency of Social Gatherings with Friends and Family:   . Attends Religious Services:   . Active Member of Clubs or Organizations:   . Attends Archivist Meetings:   Marland Kitchen Marital Status:   Intimate Partner Violence:   . Fear of Current or Ex-Partner:   . Emotionally Abused:   Marland Kitchen Physically Abused:   . Sexually Abused:     Past Surgical History:  Procedure Laterality Date  . A/V FISTULAGRAM Left 01/28/2017   Procedure: A/V Fistulagram;  Surgeon: Algernon Huxley, MD;  Location: [place] [facility];  Service: Cardiovascular;  Laterality: Left;  . A/V FISTULAGRAM Left 04/28/2018   Procedure: A/V FISTULAGRAM;  Surgeon: Algernon Huxley, MD;  Location: [place] [facility];  Service: Cardiovascular;  Laterality: Left;  . A/V FISTULAGRAM N/A 03/21/2020   Procedure: A/V FISTULAGRAM;  Surgeon: Algernon Huxley, MD;  Location: [place] [facility];  Service: Cardiovascular;  Laterality: N/A;  . AV FISTULA PLACEMENT    . AV FISTULA PLACEMENT Right 11/23/2019   Procedure: ARTERIOVENOUS (AV) FISTULA CREATION (RADIOCEPHALIC );  Surgeon: Algernon Huxley, MD;  Location: [facility];  Service: Vascular;  Laterality: Right;  . CARDIAC CATHETERIZATION    . CATARACT EXTRACTION W/PHACO Right 05/14/2016   Procedure: CATARACT EXTRACTION PHACO AND INTRAOCULAR LENS PLACEMENT (IOC);  Surgeon: Eulogio Bear, MD;  Location: [facility];  Service: Ophthalmology;  Laterality: Right;  [place] 1.46 AP% 11.5 CDE 12.33 FLUID PACK LOT # [id number] H  . EYE SURGERY    . FRACTURE  SURGERY     RIGHT LEG WITH ROD  . HIP SURGERY    . NEPHRECTOMY RADICAL    . PERIPHERAL VASCULAR CATHETERIZATION N/A 08/28/2015   Procedure: A/V Shuntogram/Fistulagram;  Surgeon: Algernon Huxley, MD;  Location: [place] [facility];  Service: Cardiovascular;  Laterality: N/A;  . PERIPHERAL VASCULAR CATHETERIZATION Left 08/28/2015   Procedure: A/V Shunt Intervention;  Surgeon: Algernon Huxley, MD;  Location: [place] [facility];  Service: Cardiovascular;  Laterality: Left;  Family History  Problem Relation Age of Onset  . CAD Father   . COPD Father   . CAD Paternal Grandfather   . Diabetes Maternal Aunt   . Diabetes Maternal Uncle     Allergies  Allergen Reactions  . Metformin Other (See Comments)    Severe diarrhea   . Penicillins Hives and Other (See Comments)    Has patient had a PCN reaction causing immediate rash, facial/tongue/throat swelling, SOB or lightheadedness with hypotension:Yes Has patient had a PCN reaction causing severe rash involving mucus membranes or skin necrosis:Yes Has patient had a PCN reaction that required hospitalization:inpatient at the time of reaction. Has patient had a PCN reaction occurring within the last 10 years:No If all of the above answers are "NO", then may proceed with Cephalosporin use.        Assessment & Plan:   1. End stage renal disease ([place]) Recommend:  The patient is experiencing increasing problems with their dialysis access.  Patient should have an angiogram with the intention for intervention.  The intention for intervention is to restore appropriate flow and prevent thrombosis and possible loss of the access. This will be utilized for treatment of steal syndrome. As well as improve the quality of dialysis therapy.  The risks, benefits and alternative therapies were reviewed in detail with the patient.  All questions were answered.  The patient agrees to proceed with angio/intervention.      2. Essential hypertension Continue  antihypertensive medications as already ordered, these medications have been reviewed and there are no changes at this time.   3. Mixed hyperlipidemia Continue statin as ordered and reviewed, no changes at this time    Current Outpatient Medications on File Prior to Visit  Medication Sig Dispense Refill  . amLODipine (NORVASC) 10 MG tablet Take 1 tablet (10 mg total) by mouth at bedtime. 30 tablet 0  . atorvastatin (LIPITOR) 80 MG tablet Take 1 tablet (80 mg total) by mouth daily. (Patient taking differently: Take 80 mg by mouth at bedtime. ) 30 tablet 2  . carvedilol (COREG) 12.5 MG tablet Take 37.5 mg by mouth 2 (two) times daily.    . clopidogrel (PLAVIX) 75 MG tablet Take 1 tablet (75 mg total) by mouth daily. (Patient taking differently: Take 75 mg by mouth See admin instructions. Take 75 mg by mouth daily on Saturday, Sunday, Tuesday and Thursday) 30 tablet 2  . furosemide (LASIX) 40 MG tablet Take 40 mg by mouth See admin instructions. Take 40 mg by mouth twice daily on Tuesday, Thursday, Saturday and Sunday.    . hydrALAZINE (APRESOLINE) 100 MG tablet Take 100 mg by mouth 3 (three) times daily.    . insulin glargine (LANTUS) 100 UNIT/ML injection Inject 9-10 Units into the skin daily.     . Insulin Pen Needle (RELION PEN NEEDLE 31G/8MM) 31G X 8 MM MISC USE AS DIRECTED ONCE DAILY WITH  LANTUS    . isosorbide mononitrate (IMDUR) 30 MG 24 hr tablet Take 1 tablet (30 mg total) by mouth daily. (Patient taking differently: Take 30 mg by mouth at bedtime. ) 30 tablet 0  . naproxen sodium (ANAPROX) 220 MG tablet Take 220 mg by mouth 2 (two) times daily as needed (for pain or headache).     . polyethylene glycol (GOLYTELY) 236 g solution Take by mouth.    Marland Kitchen HYDROcodone-acetaminophen (NORCO) 5-325 MG tablet Take 1 tablet by mouth every 6 (six) hours as needed for moderate pain. (Patient not taking: Reported on  03/21/2020) 30 tablet 0   No current facility-administered medications on file prior to  visit.    There are no Patient Instructions on file for this visit. No follow-ups on file.   Kris Hartmann, NP

## 2020-07-23 NOTE — H&P (View-Only) (Signed)
Subjective:    Patient ID: Richard Zavala, male    DOB: [birth year], 59 y.o.   MRN: [id number] Chief Complaint  Patient presents with  . Follow-up    ultrasound follow up    The patient returns to the office for followup of their dialysis access. The patient has a right radiocephalic AV fistula placed on 03/21/2020. The function of the access has been stable. The patient denies increased bleeding time or increased recirculation. Patient denies difficulty with cannulation, however he recently had an infiltration. The patient denies classic still syndrome symptoms however he has been noticing numbness of his thumb and pinky recently with more frequency is slightly increased with dialysis.  No significant arm swelling.  The patient denies redness or swelling at the access site. The patient denies fever or chills at home or while on dialysis.  The patient denies amaurosis fugax or recent TIA symptoms. There are no recent neurological changes noted. The patient denies claudication symptoms or rest pain symptoms. The patient denies history of DVT, PE or superficial thrombophlebitis. The patient denies recent episodes of angina or shortness of breath.     Today noninvasive studies show flow volume of 925. The right radial artery wrist level displays retrograde flow and with compression the anastomosis site flows are reserved suggesting still syndrome. The patient also has a significant stenosis in the distal forearm area.   Review of Systems  Musculoskeletal: Positive for gait problem.  Neurological: Positive for speech difficulty and weakness.  All other systems reviewed and are negative.      Objective:   Physical Exam Vitals reviewed.  HENT:     Head: Normocephalic.  Cardiovascular:     Rate and Rhythm: Normal rate and regular rhythm.     Pulses: Normal pulses.          Radial pulses are 2+ on the left side.     Heart sounds: Normal heart sounds.     Arteriovenous access: right  arteriovenous access is present.    Comments: Good thrill and bruit Pulmonary:     Effort: Pulmonary effort is normal.     Breath sounds: Normal breath sounds.  Neurological:     Mental Status: He is alert and oriented to person, place, and time. Mental status is at baseline.     Motor: Weakness present.     Gait: Gait abnormal.  Psychiatric:        Mood and Affect: Mood normal.        Behavior: Behavior normal.        Thought Content: Thought content normal.        Judgment: Judgment normal.     BP 134/85 (BP Location: Right Leg)   Pulse 61   Resp 16   Past Medical History:  Diagnosis Date  . Anemia   . Blind   . BPH (benign prostatic hyperplasia)   . CAD (coronary artery disease)   . CHF (congestive heart failure) ([place])   . Chronic kidney disease (CKD), stage IV (severe) (H[facility])    DIALYSIS  . Diabetes mellitus without complication ([place])   . ESRD (end stage renal disease) ([place])    Monday-Wednesday-Friday dialysis  . Hyperlipemia   . Hypertension   . Neuropathy   . Osteoporosis   . Pulmonary HTN ([place])   . Stroke [facility])    2013  . TIA (transient ischemic attack)   . TRD (traction retinal detachment)   . Wilms' tumor [facility])     Social History  Socioeconomic History  . Marital status: Divorced    Spouse name: Not on file  . Number of children: Not on file  . Years of education: Not on file  . Highest education level: Not on file  Occupational History  . Not on file  Tobacco Use  . Smoking status: Former Smoker    Packs/day: 1.00    Types: Cigarettes    Quit date: 06/26/2012    Years since quitting: 8.0  . Smokeless tobacco: Never Used  Vaping Use  . Vaping Use: Never used  Substance and Sexual Activity  . Alcohol use: No  . Drug use: No  . Sexual activity: Never  Other Topics Concern  . Not on file  Social History Narrative   Lives at home with his son. Ambulates well at baseline.   Social Determinants of Health   Financial Resource Strain:   .  Difficulty of Paying Living Expenses:   Food Insecurity:   . Worried About Charity fundraiser in the Last Year:   . Arboriculturist in the Last Year:   Transportation Needs:   . Film/video editor (Medical):   Marland Kitchen Lack of Transportation (Non-Medical):   Physical Activity:   . Days of Exercise per Week:   . Minutes of Exercise per Session:   Stress:   . Feeling of Stress :   Social Connections:   . Frequency of Communication with Friends and Family:   . Frequency of Social Gatherings with Friends and Family:   . Attends Religious Services:   . Active Member of Clubs or Organizations:   . Attends Archivist Meetings:   Marland Kitchen Marital Status:   Intimate Partner Violence:   . Fear of Current or Ex-Partner:   . Emotionally Abused:   Marland Kitchen Physically Abused:   . Sexually Abused:     Past Surgical History:  Procedure Laterality Date  . A/V FISTULAGRAM Left 01/28/2017   Procedure: A/V Fistulagram;  Surgeon: Algernon Huxley, MD;  Location: [place] [facility];  Service: Cardiovascular;  Laterality: Left;  . A/V FISTULAGRAM Left 04/28/2018   Procedure: A/V FISTULAGRAM;  Surgeon: Algernon Huxley, MD;  Location: [place] [facility];  Service: Cardiovascular;  Laterality: Left;  . A/V FISTULAGRAM N/A 03/21/2020   Procedure: A/V FISTULAGRAM;  Surgeon: Algernon Huxley, MD;  Location: [place] [facility];  Service: Cardiovascular;  Laterality: N/A;  . AV FISTULA PLACEMENT    . AV FISTULA PLACEMENT Right 11/23/2019   Procedure: ARTERIOVENOUS (AV) FISTULA CREATION (RADIOCEPHALIC );  Surgeon: Algernon Huxley, MD;  Location: [facility];  Service: Vascular;  Laterality: Right;  . CARDIAC CATHETERIZATION    . CATARACT EXTRACTION W/PHACO Right 05/14/2016   Procedure: CATARACT EXTRACTION PHACO AND INTRAOCULAR LENS PLACEMENT (IOC);  Surgeon: Eulogio Bear, MD;  Location: [facility];  Service: Ophthalmology;  Laterality: Right;  [place] 1.46 AP% 11.5 CDE 12.33 FLUID PACK LOT # [id number]  . EYE SURGERY    . FRACTURE  SURGERY     RIGHT LEG WITH ROD  . HIP SURGERY    . NEPHRECTOMY RADICAL    . PERIPHERAL VASCULAR CATHETERIZATION N/A 08/28/2015   Procedure: A/V Shuntogram/Fistulagram;  Surgeon: Algernon Huxley, MD;  Location: [place] [facility];  Service: Cardiovascular;  Laterality: N/A;  . PERIPHERAL VASCULAR CATHETERIZATION Left 08/28/2015   Procedure: A/V Shunt Intervention;  Surgeon: Algernon Huxley, MD;  Location: [place] [facility];  Service: Cardiovascular;  Laterality: Left;  Family History  Problem Relation Age of Onset  . CAD Father   . COPD Father   . CAD Paternal Grandfather   . Diabetes Maternal Aunt   . Diabetes Maternal Uncle     Allergies  Allergen Reactions  . Metformin Other (See Comments)    Severe diarrhea   . Penicillins Hives and Other (See Comments)    Has patient had a PCN reaction causing immediate rash, facial/tongue/throat swelling, SOB or lightheadedness with hypotension:Yes Has patient had a PCN reaction causing severe rash involving mucus membranes or skin necrosis:Yes Has patient had a PCN reaction that required hospitalization:inpatient at the time of reaction. Has patient had a PCN reaction occurring within the last 10 years:No If all of the above answers are "NO", then may proceed with Cephalosporin use.        Assessment & Plan:   1. End stage renal disease ([place]) Recommend:  The patient is experiencing increasing problems with their dialysis access.  Patient should have an angiogram with the intention for intervention.  The intention for intervention is to restore appropriate flow and prevent thrombosis and possible loss of the access. This will be utilized for treatment of steal syndrome. As well as improve the quality of dialysis therapy.  The risks, benefits and alternative therapies were reviewed in detail with the patient.  All questions were answered.  The patient agrees to proceed with angio/intervention.      2. Essential hypertension Continue  antihypertensive medications as already ordered, these medications have been reviewed and there are no changes at this time.   3. Mixed hyperlipidemia Continue statin as ordered and reviewed, no changes at this time    Current Outpatient Medications on File Prior to Visit  Medication Sig Dispense Refill  . amLODipine (NORVASC) 10 MG tablet Take 1 tablet (10 mg total) by mouth at bedtime. 30 tablet 0  . atorvastatin (LIPITOR) 80 MG tablet Take 1 tablet (80 mg total) by mouth daily. (Patient taking differently: Take 80 mg by mouth at bedtime. ) 30 tablet 2  . carvedilol (COREG) 12.5 MG tablet Take 37.5 mg by mouth 2 (two) times daily.    . clopidogrel (PLAVIX) 75 MG tablet Take 1 tablet (75 mg total) by mouth daily. (Patient taking differently: Take 75 mg by mouth See admin instructions. Take 75 mg by mouth daily on Saturday, Sunday, Tuesday and Thursday) 30 tablet 2  . furosemide (LASIX) 40 MG tablet Take 40 mg by mouth See admin instructions. Take 40 mg by mouth twice daily on Tuesday, Thursday, Saturday and Sunday.    . hydrALAZINE (APRESOLINE) 100 MG tablet Take 100 mg by mouth 3 (three) times daily.    . insulin glargine (LANTUS) 100 UNIT/ML injection Inject 9-10 Units into the skin daily.     . Insulin Pen Needle (RELION PEN NEEDLE 31G/8MM) 31G X 8 MM MISC USE AS DIRECTED ONCE DAILY WITH  LANTUS    . isosorbide mononitrate (IMDUR) 30 MG 24 hr tablet Take 1 tablet (30 mg total) by mouth daily. (Patient taking differently: Take 30 mg by mouth at bedtime. ) 30 tablet 0  . naproxen sodium (ANAPROX) 220 MG tablet Take 220 mg by mouth 2 (two) times daily as needed (for pain or headache).     . polyethylene glycol (GOLYTELY) 236 g solution Take by mouth.    Marland Kitchen HYDROcodone-acetaminophen (NORCO) 5-325 MG tablet Take 1 tablet by mouth every 6 (six) hours as needed for moderate pain. (Patient not taking: Reported on  03/21/2020) 30 tablet 0   No current facility-administered medications on file prior to  visit.    There are no Patient Instructions on file for this visit. No follow-ups on file.   Kris Hartmann, NP

## 2020-07-24 ENCOUNTER — Telehealth (INDEPENDENT_AMBULATORY_CARE_PROVIDER_SITE_OTHER): Payer: Self-pay

## 2020-07-24 NOTE — Telephone Encounter (Signed)
Spoke with the patient and he is scheduled with Dr. Lucky Cowboy for a RUE angio on 08/01/20 with a 9:30 am arrival to the [facility]. Covid testing on 07/30/20 between 8-1 pm at the [facility]. Pre-procedure instructions will be faxed to Shanon Payor as well as mailed.

## 2020-07-30 ENCOUNTER — Other Ambulatory Visit: Payer: Self-pay

## 2020-07-30 ENCOUNTER — Other Ambulatory Visit
Admission: RE | Admit: 2020-07-30 | Discharge: 2020-07-30 | Disposition: A | Discharge status: Home / Self Care | Payer: Medicare Other | Source: Ambulatory Visit | Attending: Vascular Surgery | Admitting: Vascular Surgery

## 2020-07-30 DIAGNOSIS — Z01812 Encounter for preprocedural laboratory examination: Secondary | ICD-10-CM | POA: Insufficient documentation

## 2020-07-30 DIAGNOSIS — Z20822 Contact with and (suspected) exposure to covid-19: Secondary | ICD-10-CM | POA: Diagnosis not present

## 2020-07-30 LAB — SARS CORONAVIRUS 2 (TAT 6-24 HRS): SARS Coronavirus 2: NEGATIVE

## 2020-07-31 ENCOUNTER — Other Ambulatory Visit (INDEPENDENT_AMBULATORY_CARE_PROVIDER_SITE_OTHER): Payer: Self-pay | Admitting: Nurse Practitioner

## 2020-08-01 ENCOUNTER — Other Ambulatory Visit: Payer: Self-pay

## 2020-08-01 ENCOUNTER — Encounter
Admission: RE | Disposition: A | Discharge status: Home / Self Care | Payer: Self-pay | Source: Home / Self Care | Attending: Vascular Surgery

## 2020-08-01 ENCOUNTER — Ambulatory Visit
Admission: RE | Admit: 2020-08-01 | Discharge: 2020-08-01 | Disposition: A | Discharge status: Home / Self Care | Payer: Medicare Other | Attending: Vascular Surgery | Admitting: Vascular Surgery

## 2020-08-01 DIAGNOSIS — I272 Pulmonary hypertension, unspecified: Secondary | ICD-10-CM | POA: Insufficient documentation

## 2020-08-01 DIAGNOSIS — N186 End stage renal disease: Secondary | ICD-10-CM | POA: Diagnosis not present

## 2020-08-01 DIAGNOSIS — Z7902 Long term (current) use of antithrombotics/antiplatelets: Secondary | ICD-10-CM | POA: Insufficient documentation

## 2020-08-01 DIAGNOSIS — I132 Hypertensive heart and chronic kidney disease with heart failure and with stage 5 chronic kidney disease, or end stage renal disease: Secondary | ICD-10-CM | POA: Insufficient documentation

## 2020-08-01 DIAGNOSIS — Z87891 Personal history of nicotine dependence: Secondary | ICD-10-CM | POA: Insufficient documentation

## 2020-08-01 DIAGNOSIS — E785 Hyperlipidemia, unspecified: Secondary | ICD-10-CM | POA: Insufficient documentation

## 2020-08-01 DIAGNOSIS — Z79899 Other long term (current) drug therapy: Secondary | ICD-10-CM | POA: Diagnosis not present

## 2020-08-01 DIAGNOSIS — E1140 Type 2 diabetes mellitus with diabetic neuropathy, unspecified: Secondary | ICD-10-CM | POA: Diagnosis not present

## 2020-08-01 DIAGNOSIS — D649 Anemia, unspecified: Secondary | ICD-10-CM | POA: Diagnosis not present

## 2020-08-01 DIAGNOSIS — I7789 Other specified disorders of arteries and arterioles: Secondary | ICD-10-CM

## 2020-08-01 DIAGNOSIS — Z992 Dependence on renal dialysis: Secondary | ICD-10-CM | POA: Insufficient documentation

## 2020-08-01 DIAGNOSIS — Y841 Kidney dialysis as the cause of abnormal reaction of the patient, or of later complication, without mention of misadventure at the time of the procedure: Secondary | ICD-10-CM | POA: Insufficient documentation

## 2020-08-01 DIAGNOSIS — I509 Heart failure, unspecified: Secondary | ICD-10-CM | POA: Insufficient documentation

## 2020-08-01 DIAGNOSIS — E1122 Type 2 diabetes mellitus with diabetic chronic kidney disease: Secondary | ICD-10-CM | POA: Insufficient documentation

## 2020-08-01 DIAGNOSIS — Z8673 Personal history of transient ischemic attack (TIA), and cerebral infarction without residual deficits: Secondary | ICD-10-CM | POA: Diagnosis not present

## 2020-08-01 DIAGNOSIS — Z794 Long term (current) use of insulin: Secondary | ICD-10-CM | POA: Diagnosis not present

## 2020-08-01 DIAGNOSIS — T82898A Other specified complication of vascular prosthetic devices, implants and grafts, initial encounter: Secondary | ICD-10-CM | POA: Insufficient documentation

## 2020-08-01 DIAGNOSIS — I2510 Atherosclerotic heart disease of native coronary artery without angina pectoris: Secondary | ICD-10-CM | POA: Insufficient documentation

## 2020-08-01 HISTORY — PX: UPPER EXTREMITY ANGIOGRAPHY: CATH118270

## 2020-08-01 LAB — POTASSIUM (ARMC VASCULAR LAB ONLY): Potassium (ARMC vascular lab): 3.8 (ref 3.5–5.1)

## 2020-08-01 SURGERY — UPPER EXTREMITY ANGIOGRAPHY
Anesthesia: Moderate Sedation | Laterality: Right

## 2020-08-01 MED ORDER — ONDANSETRON HCL 4 MG/2ML IJ SOLN: 4.0000 mg | Freq: Four times a day (QID) | INTRAMUSCULAR | Status: DC | PRN

## 2020-08-01 MED ORDER — HEPARIN SODIUM (PORCINE) 1000 UNIT/ML IJ SOLN
INTRAMUSCULAR | Status: AC
  Filled 2020-08-01: qty 1

## 2020-08-01 MED ORDER — DIPHENHYDRAMINE HCL 50 MG/ML IJ SOLN
INTRAMUSCULAR | Status: DC | PRN
  Administered 2020-08-01: 50 mg via INTRAVENOUS

## 2020-08-01 MED ORDER — CLINDAMYCIN PHOSPHATE 300 MG/50ML IV SOLN
INTRAVENOUS | Status: AC
  Administered 2020-08-01: 300 mg via INTRAVENOUS
  Filled 2020-08-01: qty 50

## 2020-08-01 MED ORDER — DIPHENHYDRAMINE HCL 50 MG/ML IJ SOLN: 50.0000 mg | Freq: Once | INTRAMUSCULAR | Status: DC | PRN

## 2020-08-01 MED ORDER — FAMOTIDINE 20 MG PO TABS: 40.0000 mg | ORAL_TABLET | Freq: Once | ORAL | Status: DC | PRN

## 2020-08-01 MED ORDER — HEPARIN SODIUM (PORCINE) 1000 UNIT/ML IJ SOLN
INTRAMUSCULAR | Status: DC | PRN
  Administered 2020-08-01: 3000 [IU] via INTRAVENOUS

## 2020-08-01 MED ORDER — DIPHENHYDRAMINE HCL 50 MG/ML IJ SOLN
INTRAMUSCULAR | Status: AC
  Filled 2020-08-01: qty 1

## 2020-08-01 MED ORDER — HYDROMORPHONE HCL 1 MG/ML IJ SOLN: 1.0000 mg | Freq: Once | INTRAMUSCULAR | Status: DC | PRN

## 2020-08-01 MED ORDER — CLINDAMYCIN PHOSPHATE 300 MG/50ML IV SOLN: 300.0000 mg | Freq: Once | INTRAVENOUS | Status: AC

## 2020-08-01 MED ORDER — METHYLPREDNISOLONE SODIUM SUCC 125 MG IJ SOLR: 125.0000 mg | Freq: Once | INTRAMUSCULAR | Status: DC | PRN

## 2020-08-01 MED ORDER — MIDAZOLAM HCL 2 MG/2ML IJ SOLN
INTRAMUSCULAR | Status: DC | PRN
  Administered 2020-08-01: 2 mg via INTRAVENOUS

## 2020-08-01 MED ORDER — IODIXANOL 320 MG/ML IV SOLN
INTRAVENOUS | Status: DC | PRN
  Administered 2020-08-01: 50 mL

## 2020-08-01 MED ORDER — MIDAZOLAM HCL 2 MG/ML PO SYRP: 8.0000 mg | ORAL_SOLUTION | Freq: Once | ORAL | Status: DC | PRN

## 2020-08-01 MED ORDER — FENTANYL CITRATE (PF) 100 MCG/2ML IJ SOLN
INTRAMUSCULAR | Status: DC | PRN
  Administered 2020-08-01: 50 ug via INTRAVENOUS

## 2020-08-01 MED ORDER — SODIUM CHLORIDE 0.9 % IV SOLN: INTRAVENOUS | Status: DC

## 2020-08-01 MED ORDER — FENTANYL CITRATE (PF) 100 MCG/2ML IJ SOLN
INTRAMUSCULAR | Status: AC
  Filled 2020-08-01: qty 2

## 2020-08-01 MED ORDER — MIDAZOLAM HCL 5 MG/5ML IJ SOLN
INTRAMUSCULAR | Status: AC
  Filled 2020-08-01: qty 5

## 2020-08-01 SURGICAL SUPPLY — 12 items
CANNULA 5F STIFF (CANNULA) ×6 IMPLANT
CATH ANGIO 5F PIGTAIL 100CM (CATHETERS) ×3 IMPLANT
CATH BEACON 5 .035 100 H1 TIP (CATHETERS) ×3 IMPLANT
CATH BERNSTEIN 5FR 130CM (CATHETERS) ×3 IMPLANT
DEVICE STARCLOSE SE CLOSURE (Vascular Products) ×3 IMPLANT
DEVICE TORQUE .025-.038 (MISCELLANEOUS) ×3 IMPLANT
GLIDEWIRE ANGLED SS 035X260CM (WIRE) ×6 IMPLANT
PACK ANGIOGRAPHY (CUSTOM PROCEDURE TRAY) ×3 IMPLANT
SHEATH BRITE TIP 5FRX11 (SHEATH) ×3 IMPLANT
SYR MEDRAD MARK 7 150ML (SYRINGE) ×3 IMPLANT
TUBING CONTRAST HIGH PRESS 72 (TUBING) ×3 IMPLANT
WIRE J 3MM .035X145CM (WIRE) ×3 IMPLANT

## 2020-08-01 NOTE — Progress Notes (Signed)
Patient has remained clinically stable post upper extremity angiogram. Tolerated well. No bleeding nor hematoma visible at this time. Discharge instructions given to patient and son over phone. Eating lunch. Vitals have remained stable post procedure. Denies complaints at this time.

## 2020-08-01 NOTE — Discharge Instructions (Signed)
Angiogram, Care After This sheet gives you information about how to care for yourself after your procedure. Your health care provider may also give you more specific instructions. If you have problems or questions, contact your health care provider. What can I expect after the procedure? After the procedure, it is common to have bruising and tenderness at the catheter insertion area. Follow these instructions at home: Insertion site care  Follow instructions from your health care provider about how to take care of your insertion site. Make sure you: ? Wash your hands with soap and water before you change your bandage (dressing). If soap and water are not available, use hand sanitizer. ? Change your dressing as told by your health care provider. ? Leave stitches (sutures), skin glue, or adhesive strips in place. These skin closures may need to stay in place for 2 weeks or longer. If adhesive strip edges start to loosen and curl up, you may trim the loose edges. Do not remove adhesive strips completely unless your health care provider tells you to do that.  Do not take baths, swim, or use a hot tub until your health care provider approves.  You may shower 24-48 hours after the procedure or as told by your health care provider. ? Gently wash the site with plain soap and water. ? Pat the area dry with a clean towel. ? Do not rub the site. This may cause bleeding.  Do not apply powder or lotion to the site. Keep the site clean and dry.  Check your insertion site every day for signs of infection. Check for: ? Redness, swelling, or pain. ? Fluid or blood. ? Warmth. ? Pus or a bad smell. Activity  Rest as told by your health care provider, usually for 1-2 days.  Do not lift anything that is heavier than 10 lbs. (4.5 kg) or as told by your health care provider.  Do not drive for 24 hours if you were given a medicine to help you relax (sedative).  Do not drive or use heavy machinery while  taking prescription pain medicine. General instructions   Return to your normal activities as told by your health care provider, usually in about a week. Ask your health care provider what activities are safe for you.  If the catheter site starts bleeding, lie flat and put pressure on the site. If the bleeding does not stop, get help right away. This is a medical emergency.  Drink enough fluid to keep your urine clear or pale yellow. This helps flush the contrast dye from your body.  Take over-the-counter and prescription medicines only as told by your health care provider.  Keep all follow-up visits as told by your health care provider. This is important. Contact a health care provider if:  You have a fever or chills.  You have redness, swelling, or pain around your insertion site.  You have fluid or blood coming from your insertion site.  The insertion site feels warm to the touch.  You have pus or a bad smell coming from your insertion site.  You have bruising around the insertion site.  You notice blood collecting in the tissue around the catheter site (hematoma). The hematoma may be painful to the touch. Get help right away if:  You have severe pain at the catheter insertion area.  The catheter insertion area swells very fast.  The catheter insertion area is bleeding, and the bleeding does not stop when you hold steady pressure on the area.    The area near or just beyond the catheter insertion site becomes pale, cool, tingly, or numb. These symptoms may represent a serious problem that is an emergency. Do not wait to see if the symptoms will go away. Get medical help right away. Call your local emergency services (911 in the [place] Do not drive yourself to the hospital. Summary  After the procedure, it is common to have bruising and tenderness at the catheter insertion area.  After the procedure, it is important to rest and drink plenty of fluids.  Do not take baths,  swim, or use a hot tub until your health care provider says it is okay to do so. You may shower 24-48 hours after the procedure or as told by your health care provider.  If the catheter site starts bleeding, lie flat and put pressure on the site. If the bleeding does not stop, get help right away. This is a medical emergency. This information is not intended to replace advice given to you by your health care provider. Make sure you discuss any questions you have with your health care provider. Document Revised: 11/19/2017 Document Reviewed: 11/11/2016 Elsevier Patient Education  2020 Elsevier Inc[employer]  

## 2020-08-01 NOTE — Op Note (Signed)
OPERATIVE REPORT   PREOPERATIVE DIAGNOSIS: 1. End-stage renal disease. 2. Steal syndrome, right arm with patent right radiocephalic AVF.  POSTOPERATIVE DIAGNOSIS: Same as above  PROCEDURE PERFORMED: 1. Ultrasound guidance vascular access to right femoral artery. 2. Catheter placement to right radial artery and right ulnar arteries  from right femoral approach. 3. Thoracic aortogram and selective right upper extremity angiogram  including selective images of the radial and ulnar arteries. 4. StarClose closure device right femoral artery.  SURGEON: Algernon Huxley, MD  ANESTHESIA: Local with moderate conscious sedation for 25 minutes using 2 mg of Versed and 50 mcg of Fentanyl  BLOOD LOSS: Minimal.  FLUOROSCOPY TIME: 4.3  INDICATION FOR PROCEDURE: This is a 59 y.o.male who presented to our office with steal syndrome. The patient's right radiocephalic AV fistula is working well, but their hand is numb and painful. To further evaluate this to determine what options would be possible to treat the steal syndrome, angiogram of the left upper extremity is indicated. Risks and benefits are discussed. Informed consent was obtained.  DESCRIPTION OF PROCEDURE: The patient was brought to the vascular suite. Moderate conscious sedation was administered during a face to face encounter with the patient throughout the procedure with my supervision of the RN administering medicines and monitoring the patient's vital signs, pulse oximetry, telemetry and mental status throughout from the start of the procedure until the patient was taken to the recovery room.  Groins were shaved and prepped and sterile surgical field was created. The right femoral head was localized with fluoroscopy and the right femoral artery was then visualized with ultrasound and found to be widely patent. It was then accessed under direct ultrasound guidance without difficulty with a Seldinger needle  and a permanent image was recorded. A J-wire and 5-French sheath were then placed. Pigtail catheter was placed into the ascending aorta and a thoracic aortogram was then performed in the LAO projection. This demonstrated normal origins to the great vessels without significant proximal stenoses and a normal configuration of the great vessels. The patient was given 3000 units of intravenous heparin and a headhunter catheter was used to selectively cannulate the innominate artery and then advanced into the right subclavian artery without difficulty. This was then sequentially advanced to the brachial artery and to the brachial bifurcation.  There did appear to be some degree of steal with brisk filling of the fistula. I then advanced a catheter into the radial and ulnar arteries sequentially.  The radial artery was entered first, and selective imaging was performed.  This demonstrated all of the flow in the radial artery going out the radiocephalic AV fistula with none of the flow going into the hand from a selective radial artery injection.  After this, the catheter was removed and placed into the ulnar artery, this was evaluated. The ulnar artery was widely patent without a focal stenosis and reasonable filling of the hand on a selective injection.  There was some delayed retrograde flow through the palmar arch out the radial artery and into the fistula even with selective ulnar artery injection.  This is consistent with some degree of steal syndrome.  If he continues to have worsening steal syndrome symptoms, consideration for ligation of the distal radial artery or future embolization of the distal radial artery beyond the radiocephalic anastomosis to avoid retrograde flow would be considered. The diagnostic catheter was removed. Oblique arteriogram was performed of the right femoral artery and StarClose closure device deployed in the usual fashion with excellent hemostatic  result. The patient  tolerated the procedure well and was taken to the recovery room in stable condition.   [name] 08/01/2020 12:28 PM

## 2020-08-01 NOTE — Interval H&P Note (Signed)
History and Physical Interval Note:  08/01/2020 11:08 AM  Richard Zavala  has presented today for surgery, with the diagnosis of RT Upper Extremity Angiography   Steal Syndrome   BARD Rep   cc: M Godley, S Willey   Pt to have Covid test on 07-30-20.  The various methods of treatment have been discussed with the patient and family. After consideration of risks, benefits and other options for treatment, the patient has consented to  Procedure(s): UPPER EXTREMITY ANGIOGRAPHY (Right) as a surgical intervention.  The patient's history has been reviewed, patient examined, no change in status, stable for surgery.  I have reviewed the patient's chart and labs.  Questions were answered to the patient's satisfaction.     [name]

## 2020-08-02 ENCOUNTER — Encounter: Payer: Self-pay | Admitting: Vascular Surgery

## 2020-09-03 ENCOUNTER — Ambulatory Visit (INDEPENDENT_AMBULATORY_CARE_PROVIDER_SITE_OTHER): Payer: Medicare Other | Admitting: Vascular Surgery

## 2020-09-03 ENCOUNTER — Other Ambulatory Visit: Payer: Self-pay

## 2020-09-03 ENCOUNTER — Encounter (INDEPENDENT_AMBULATORY_CARE_PROVIDER_SITE_OTHER): Payer: Self-pay | Admitting: Vascular Surgery

## 2020-09-03 VITALS — BP 127/78 | HR 65 | Resp 16 | Wt 161.0 lb

## 2020-09-03 DIAGNOSIS — N186 End stage renal disease: Secondary | ICD-10-CM

## 2020-09-03 DIAGNOSIS — Z992 Dependence on renal dialysis: Secondary | ICD-10-CM

## 2020-09-03 DIAGNOSIS — E1122 Type 2 diabetes mellitus with diabetic chronic kidney disease: Secondary | ICD-10-CM | POA: Diagnosis not present

## 2020-09-03 DIAGNOSIS — I10 Essential (primary) hypertension: Secondary | ICD-10-CM | POA: Diagnosis not present

## 2020-09-03 DIAGNOSIS — Z794 Long term (current) use of insulin: Secondary | ICD-10-CM

## 2020-09-03 NOTE — Progress Notes (Signed)
MRN : [id number]  Richard Zavala is a 59 y.o. ([birth year]) male who presents with chief complaint of  Chief Complaint  Patient presents with  . Follow-up    [facility] 32month post ue angio  .  History of Present Illness: Patient returns today in follow up of his dialysis access.  His steal symptoms in the right hand are stable to slightly improved since his angiogram a month ago.  He does have steal with retrograde flow through the palmar arch going out his right radiocephalic AV fistula as well as all of the flow from the radial circulation going out the right radiocephalic AV fistula.  The fistula is working well and he is using for dialysis.  He has no ulceration or infection.  He does have numbness and some occasional pain particular with dialysis.  He also has a large aneurysmal left radiocephalic AV fistula which is not currently being used.  His right arm fistula was put in due to the aneurysmal fistula with skin threat.  With not being used, the skin is healthy with no signs of bleeding.  Current Outpatient Medications  Medication Sig Dispense Refill  . amLODipine (NORVASC) 10 MG tablet Take 1 tablet (10 mg total) by mouth at bedtime. 30 tablet 0  . atorvastatin (LIPITOR) 80 MG tablet Take 1 tablet (80 mg total) by mouth daily. (Patient taking differently: Take 80 mg by mouth at bedtime. ) 30 tablet 2  . carvedilol (COREG) 12.5 MG tablet Take 37.5 mg by mouth 2 (two) times daily.    . clopidogrel (PLAVIX) 75 MG tablet Take 1 tablet (75 mg total) by mouth daily. (Patient taking differently: Take 75 mg by mouth See admin instructions. Take 75 mg by mouth daily on Saturday, Sunday, Tuesday and Thursday) 30 tablet 2  . furosemide (LASIX) 40 MG tablet Take 40 mg by mouth See admin instructions. Take 40 mg by mouth twice daily on Tuesday, Thursday, Saturday and Sunday.    . hydrALAZINE (APRESOLINE) 100 MG tablet Take 100 mg by mouth 3 (three) times daily.    . insulin glargine (LANTUS) 100  UNIT/ML injection Inject 9-10 Units into the skin daily.     . Insulin Pen Needle (RELION PEN NEEDLE 31G/8MM) 31G X 8 MM MISC USE AS DIRECTED ONCE DAILY WITH  LANTUS    . isosorbide mononitrate (IMDUR) 30 MG 24 hr tablet Take 1 tablet (30 mg total) by mouth daily. (Patient taking differently: Take 30 mg by mouth at bedtime. ) 30 tablet 0  . naproxen sodium (ANAPROX) 220 MG tablet Take 220 mg by mouth 2 (two) times daily as needed (for pain or headache).     . polyethylene glycol (GOLYTELY) 236 g solution Take by mouth.    Marland Kitchen HYDROcodone-acetaminophen (NORCO) 5-325 MG tablet Take 1 tablet by mouth every 6 (six) hours as needed for moderate pain. (Patient not taking: Reported on 03/21/2020) 30 tablet 0   No current facility-administered medications for this visit.    Past Medical History:  Diagnosis Date  . Anemia   . Blind   . BPH (benign prostatic hyperplasia)   . CAD (coronary artery disease)   . CHF (congestive heart failure) ([place])   . Chronic kidney disease (CKD), stage IV (severe) (HCC)    DIALYSIS  . Diabetes mellitus without complication ([place])   . ESRD (end stage renal disease) ([place])    Monday-Wednesday-Friday dialysis  . Hyperlipemia   . Hypertension   . Neuropathy   . Osteoporosis   .  Pulmonary HTN ([place])   . Stroke [facility])    2013  . TIA (transient ischemic attack)   . TRD (traction retinal detachment)   . Wilms' tumor [facility])     Past Surgical History:  Procedure Laterality Date  . A/V FISTULAGRAM Left 01/28/2017   Procedure: A/V Fistulagram;  Surgeon: Algernon Huxley, MD;  Location: [place] CV [facility];  Service: Cardiovascular;  Laterality: Left;  . A/V FISTULAGRAM Left 04/28/2018   Procedure: A/V FISTULAGRAM;  Surgeon: Algernon Huxley, MD;  Location: [place] CV [facility];  Service: Cardiovascular;  Laterality: Left;  . A/V FISTULAGRAM N/A 03/21/2020   Procedure: A/V FISTULAGRAM;  Surgeon: Algernon Huxley, MD;  Location: [place] [facility] LAB;  Service: Cardiovascular;  Laterality: N/A;    . AV FISTULA PLACEMENT    . AV FISTULA PLACEMENT Right 11/23/2019   Procedure: ARTERIOVENOUS (AV) FISTULA CREATION (RADIOCEPHALIC );  Surgeon: Algernon Huxley, MD;  Location: [facility] ORS;  Service: Vascular;  Laterality: Right;  . CARDIAC CATHETERIZATION    . CATARACT EXTRACTION W/PHACO Right 05/14/2016   Procedure: CATARACT EXTRACTION PHACO AND INTRAOCULAR LENS PLACEMENT (IOC);  Surgeon: Eulogio Bear, MD;  Location: [facility] ORS;  Service: Ophthalmology;  Laterality: Right;  [place] 1.46 AP% 11.5 CDE 12.33 FLUID PACK LOT # [id number]  . EYE SURGERY    . FRACTURE SURGERY     RIGHT LEG WITH ROD  . HIP SURGERY    . NEPHRECTOMY RADICAL    . PERIPHERAL VASCULAR CATHETERIZATION N/A 08/28/2015   Procedure: A/V Shuntogram/Fistulagram;  Surgeon: Algernon Huxley, MD;  Location: [place] CV LAB;  Service: Cardiovascular;  Laterality: N/A;  . PERIPHERAL VASCULAR CATHETERIZATION Left 08/28/2015   Procedure: A/V Shunt Intervention;  Surgeon: Algernon Huxley, MD;  Location: [place] CV LAB;  Service: Cardiovascular;  Laterality: Left;  . UPPER EXTREMITY ANGIOGRAPHY Right 08/01/2020   Procedure: UPPER EXTREMITY ANGIOGRAPHY;  Surgeon: Algernon Huxley, MD;  Location: [place] [facility];  Service: Cardiovascular;  Laterality: Right;     Social History   Tobacco Use  . Smoking status: Former Smoker    Packs/day: 1.00    Types: Cigarettes    Quit date: 06/26/2012    Years since quitting: 8.1  . Smokeless tobacco: Never Used  Vaping Use  . Vaping Use: Never used  Substance Use Topics  . Alcohol use: No  . Drug use: No      Family History  Problem Relation Age of Onset  . CAD Father   . COPD Father   . CAD Paternal Grandfather   . Diabetes Maternal Aunt   . Diabetes Maternal Uncle      Allergies  Allergen Reactions  . Metformin Other (See Comments)    Severe diarrhea   . Penicillins Hives and Other (See Comments)    Has patient had a PCN reaction causing immediate rash, facial/tongue/throat swelling,  SOB or lightheadedness with hypotension:Yes Has patient had a PCN reaction causing severe rash involving mucus membranes or skin necrosis:Yes Has patient had a PCN reaction that required hospitalization:inpatient at the time of reaction. Has patient had a PCN reaction occurring within the last 10 years:No If all of the above answers are "NO", then may proceed with Cephalosporin use.      REVIEW OF SYSTEMS (Negative unless checked)  Constitutional: [] Weight loss  [] Fever  [] Chills Cardiac: [] Chest pain   [] Chest pressure   [] Palpitations   [] Shortness of breath when laying flat   [] Shortness of breath at rest   []   Shortness of breath with exertion. Vascular:  [] Pain in legs with walking   [] Pain in legs at rest   [] Pain in legs when laying flat   [] Claudication   [] Pain in feet when walking  [] Pain in feet at rest  [] Pain in feet when laying flat   [] History of DVT   [] Phlebitis   [] Swelling in legs   [] Varicose veins   [] Non-healing ulcers Pulmonary:   [] Uses home oxygen   [] Productive cough   [] Hemoptysis   [] Wheeze  [] COPD   [] Asthma Neurologic:  [] Dizziness  [] Blackouts   [] Seizures   [] History of stroke   [x] History of TIA  [] Aphasia   [] Temporary blindness   [] Dysphagia   [x] Weakness or numbness in arms   [] Weakness or numbness in legs Musculoskeletal:  [] Arthritis   [] Joint swelling   [] Joint pain   [] Low back pain Hematologic:  [] Easy bruising  [] Easy bleeding   [] Hypercoagulable state   [x] Anemic   Gastrointestinal:  [] Blood in stool   [] Vomiting blood  [] Gastroesophageal reflux/heartburn   [] Abdominal pain Genitourinary:  [x] Chronic kidney disease   [] Difficult urination  [] Frequent urination  [] Burning with urination   [] Hematuria Skin:  [] Rashes   [] Ulcers   [] Wounds Psychological:  [] History of anxiety   []  History of major depression.  Physical Examination  BP 127/78 (BP Location: Right Leg)   Pulse 65   Resp 16   Wt 161 lb (73 kg)   BMI 24.48 kg/m  Gen:  WD/WN, NAD Head:  [place]/AT, No temporalis wasting. Ear/Nose/Throat: Hearing grossly intact, nares w/o erythema or drainage Eyes: Conjunctiva clear. Sclera non-icteric Neck: Supple.  Trachea midline Pulmonary:  Good air movement, no use of accessory muscles.  Cardiac: RRR, no JVD Vascular: Good thrill and bruit in right radiocephalic AV fistula.  Right wrist deformity is present.  Large aneurysmal left radiocephalic AV fistula with good thrill and bruit. Vessel Right Left  Radial Palpable Palpable               Musculoskeletal: M/S 5/5 throughout.  Obvious deformity of the right wrist.   Neurologic: Sensation grossly intact in extremities.  Symmetrical.   Psychiatric: Judgment and insight appear fair. Dermatologic: No rashes or ulcers noted.  No cellulitis or open wounds.       Labs Recent Results (from the past 2160 hour(s))  SARS CORONAVIRUS 2 (TAT 6-24 HRS) Nasopharyngeal Nasopharyngeal Swab     Status: None   Collection Time: 07/30/20 10:10 AM   Specimen: Nasopharyngeal Swab  Result Value Ref Range   SARS Coronavirus 2 NEGATIVE NEGATIVE    Comment: (NOTE) SARS-CoV-2 target nucleic acids are NOT DETECTED.  The SARS-CoV-2 RNA is generally detectable in upper and lower respiratory specimens during the acute phase of infection. Negative results do not preclude SARS-CoV-2 infection, do not rule out co-infections with other pathogens, and should not be used as the sole basis for treatment or other patient management decisions. Negative results must be combined with clinical observations, patient history, and epidemiological information. The expected result is Negative.  Fact Sheet for Patients: SugarRoll.be  Fact Sheet for Healthcare Providers: https://www.woods-mathews.com/  This test is not yet approved or cleared by the [place] FDA and  has been authorized for detection and/or diagnosis of SARS-CoV-2 by FDA under an Emergency Use  Authorization (EUA). This EUA will remain  in effect (meaning this test can be used) for the duration of the COVID-19 declaration under Se ction 564(b)(1) of the Act, 21 U.S.C. section 360bbb-3(b)(1), unless the  authorization is terminated or revoked sooner.  Performed at [facility], [place] [address] [place], [place] [address]   [facility] vascular lab only)     Status: None   Collection Time: 08/01/20 10:13 AM  Result Value Ref Range   [facility] vascular lab) 3.8 3.5 - 5.1    Comment: Performed at [facility], [address] [place],  [address]    Radiology No results found.  Assessment/Plan  End stage renal disease (HCC) Currently using his right radiocephalic AV fistula although he does have some steal.  A large aneurysmal left radiocephalic AV fistula remains patent but is currently not being used so there is no risk of skin breakdown or bleeding.  We discussed treatment for steal syndrome which would be ligation or embolization of the radial artery distal to the fistula to avoid retrograde flow through the palmar arch.  He says the symptoms are not bad enough to warrant that at this point does not want to have it done.  That is certainly reasonable.  If symptoms worsen I would recommend 1 of those 2 treatments.  I will plan to see him back in about 3 months with a duplex for follow-up of his access and steal.  Type 2 diabetes mellitus with kidney complication, with long-term current use of insulin (HCC) blood glucose control important in reducing the progression of atherosclerotic disease. Also, involved in wound healing. On appropriate medications.   Essential hypertension blood pressure control important in reducing the progression of atherosclerotic disease. On appropriate oral medications.     [name], MD  09/03/2020 5:18 PM    This note was created with Dragon medical transcription system.  Any errors from dictation are purely  unintentional

## 2020-09-03 NOTE — Assessment & Plan Note (Signed)
blood glucose control important in reducing the progression of atherosclerotic disease. Also, involved in wound healing. On appropriate medications.  

## 2020-09-03 NOTE — Assessment & Plan Note (Signed)
Currently using his right radiocephalic AV fistula although he does have some steal.  A large aneurysmal left radiocephalic AV fistula remains patent but is currently not being used so there is no risk of skin breakdown or bleeding.  We discussed treatment for steal syndrome which would be ligation or embolization of the radial artery distal to the fistula to avoid retrograde flow through the palmar arch.  He says the symptoms are not bad enough to warrant that at this point does not want to have it done.  That is certainly reasonable.  If symptoms worsen I would recommend 1 of those 2 treatments.  I will plan to see him back in about 3 months with a duplex for follow-up of his access and steal.

## 2020-09-03 NOTE — Assessment & Plan Note (Signed)
blood pressure control important in reducing the progression of atherosclerotic disease. On appropriate oral medications.  

## 2020-12-10 ENCOUNTER — Ambulatory Visit (INDEPENDENT_AMBULATORY_CARE_PROVIDER_SITE_OTHER): Payer: Medicare Other | Admitting: Vascular Surgery

## 2020-12-10 ENCOUNTER — Encounter (INDEPENDENT_AMBULATORY_CARE_PROVIDER_SITE_OTHER): Payer: Medicare Other

## 2021-01-31 ENCOUNTER — Telehealth: Payer: Self-pay | Admitting: Cardiovascular Disease

## 2021-01-31 NOTE — Telephone Encounter (Signed)
3 attempts to schedule fu appt from recall list.   Deleting recall.   

## 2021-02-06 ENCOUNTER — Emergency Department: Payer: Medicare Other

## 2021-02-06 ENCOUNTER — Inpatient Hospital Stay
Admission: EM | Admit: 2021-02-06 | Discharge: 2021-02-09 | DRG: 377 | Disposition: A | Discharge status: Home / Self Care | Payer: Medicare Other | Attending: Hospitalist | Admitting: Hospitalist

## 2021-02-06 ENCOUNTER — Other Ambulatory Visit: Payer: Self-pay

## 2021-02-06 DIAGNOSIS — D696 Thrombocytopenia, unspecified: Secondary | ICD-10-CM | POA: Diagnosis present

## 2021-02-06 DIAGNOSIS — K2981 Duodenitis with bleeding: Principal | ICD-10-CM | POA: Diagnosis present

## 2021-02-06 DIAGNOSIS — E1165 Type 2 diabetes mellitus with hyperglycemia: Secondary | ICD-10-CM | POA: Diagnosis not present

## 2021-02-06 DIAGNOSIS — E1129 Type 2 diabetes mellitus with other diabetic kidney complication: Secondary | ICD-10-CM

## 2021-02-06 DIAGNOSIS — N3000 Acute cystitis without hematuria: Secondary | ICD-10-CM | POA: Diagnosis present

## 2021-02-06 DIAGNOSIS — K921 Melena: Secondary | ICD-10-CM | POA: Diagnosis not present

## 2021-02-06 DIAGNOSIS — E875 Hyperkalemia: Secondary | ICD-10-CM | POA: Diagnosis not present

## 2021-02-06 DIAGNOSIS — E11319 Type 2 diabetes mellitus with unspecified diabetic retinopathy without macular edema: Secondary | ICD-10-CM | POA: Diagnosis present

## 2021-02-06 DIAGNOSIS — E1142 Type 2 diabetes mellitus with diabetic polyneuropathy: Secondary | ICD-10-CM | POA: Diagnosis present

## 2021-02-06 DIAGNOSIS — Z825 Family history of asthma and other chronic lower respiratory diseases: Secondary | ICD-10-CM

## 2021-02-06 DIAGNOSIS — N186 End stage renal disease: Secondary | ICD-10-CM | POA: Diagnosis present

## 2021-02-06 DIAGNOSIS — N3090 Cystitis, unspecified without hematuria: Secondary | ICD-10-CM

## 2021-02-06 DIAGNOSIS — D509 Iron deficiency anemia, unspecified: Secondary | ICD-10-CM | POA: Diagnosis present

## 2021-02-06 DIAGNOSIS — N2581 Secondary hyperparathyroidism of renal origin: Secondary | ICD-10-CM | POA: Diagnosis present

## 2021-02-06 DIAGNOSIS — I2510 Atherosclerotic heart disease of native coronary artery without angina pectoris: Secondary | ICD-10-CM | POA: Diagnosis not present

## 2021-02-06 DIAGNOSIS — E785 Hyperlipidemia, unspecified: Secondary | ICD-10-CM | POA: Diagnosis present

## 2021-02-06 DIAGNOSIS — Z992 Dependence on renal dialysis: Secondary | ICD-10-CM | POA: Diagnosis not present

## 2021-02-06 DIAGNOSIS — Z79899 Other long term (current) drug therapy: Secondary | ICD-10-CM

## 2021-02-06 DIAGNOSIS — K922 Gastrointestinal hemorrhage, unspecified: Secondary | ICD-10-CM | POA: Diagnosis present

## 2021-02-06 DIAGNOSIS — E11628 Type 2 diabetes mellitus with other skin complications: Secondary | ICD-10-CM | POA: Diagnosis not present

## 2021-02-06 DIAGNOSIS — I10 Essential (primary) hypertension: Secondary | ICD-10-CM

## 2021-02-06 DIAGNOSIS — M810 Age-related osteoporosis without current pathological fracture: Secondary | ICD-10-CM | POA: Diagnosis present

## 2021-02-06 DIAGNOSIS — Z85528 Personal history of other malignant neoplasm of kidney: Secondary | ICD-10-CM

## 2021-02-06 DIAGNOSIS — Z8249 Family history of ischemic heart disease and other diseases of the circulatory system: Secondary | ICD-10-CM | POA: Diagnosis not present

## 2021-02-06 DIAGNOSIS — K635 Polyp of colon: Secondary | ICD-10-CM | POA: Diagnosis present

## 2021-02-06 DIAGNOSIS — Z880 Allergy status to penicillin: Secondary | ICD-10-CM | POA: Diagnosis not present

## 2021-02-06 DIAGNOSIS — E1122 Type 2 diabetes mellitus with diabetic chronic kidney disease: Secondary | ICD-10-CM | POA: Diagnosis present

## 2021-02-06 DIAGNOSIS — U071 COVID-19: Secondary | ICD-10-CM | POA: Diagnosis present

## 2021-02-06 DIAGNOSIS — Z794 Long term (current) use of insulin: Secondary | ICD-10-CM | POA: Diagnosis not present

## 2021-02-06 DIAGNOSIS — L03119 Cellulitis of unspecified part of limb: Secondary | ICD-10-CM | POA: Diagnosis not present

## 2021-02-06 DIAGNOSIS — Z888 Allergy status to other drugs, medicaments and biological substances status: Secondary | ICD-10-CM | POA: Diagnosis not present

## 2021-02-06 DIAGNOSIS — N400 Enlarged prostate without lower urinary tract symptoms: Secondary | ICD-10-CM | POA: Diagnosis present

## 2021-02-06 DIAGNOSIS — I272 Pulmonary hypertension, unspecified: Secondary | ICD-10-CM | POA: Diagnosis present

## 2021-02-06 DIAGNOSIS — I5032 Chronic diastolic (congestive) heart failure: Secondary | ICD-10-CM | POA: Diagnosis present

## 2021-02-06 DIAGNOSIS — H547 Unspecified visual loss: Secondary | ICD-10-CM | POA: Diagnosis present

## 2021-02-06 DIAGNOSIS — K219 Gastro-esophageal reflux disease without esophagitis: Secondary | ICD-10-CM | POA: Diagnosis not present

## 2021-02-06 DIAGNOSIS — D62 Acute posthemorrhagic anemia: Secondary | ICD-10-CM | POA: Diagnosis present

## 2021-02-06 DIAGNOSIS — D631 Anemia in chronic kidney disease: Secondary | ICD-10-CM | POA: Diagnosis present

## 2021-02-06 DIAGNOSIS — D1339 Benign neoplasm of other parts of small intestine: Secondary | ICD-10-CM | POA: Diagnosis present

## 2021-02-06 DIAGNOSIS — N390 Urinary tract infection, site not specified: Secondary | ICD-10-CM | POA: Diagnosis not present

## 2021-02-06 DIAGNOSIS — Z8673 Personal history of transient ischemic attack (TIA), and cerebral infarction without residual deficits: Secondary | ICD-10-CM | POA: Diagnosis not present

## 2021-02-06 DIAGNOSIS — R5381 Other malaise: Secondary | ICD-10-CM | POA: Diagnosis present

## 2021-02-06 DIAGNOSIS — E0865 Diabetes mellitus due to underlying condition with hyperglycemia: Secondary | ICD-10-CM | POA: Diagnosis not present

## 2021-02-06 DIAGNOSIS — K8020 Calculus of gallbladder without cholecystitis without obstruction: Secondary | ICD-10-CM | POA: Diagnosis present

## 2021-02-06 DIAGNOSIS — I132 Hypertensive heart and chronic kidney disease with heart failure and with stage 5 chronic kidney disease, or end stage renal disease: Secondary | ICD-10-CM | POA: Diagnosis present

## 2021-02-06 DIAGNOSIS — Z833 Family history of diabetes mellitus: Secondary | ICD-10-CM

## 2021-02-06 DIAGNOSIS — Z87891 Personal history of nicotine dependence: Secondary | ICD-10-CM

## 2021-02-06 LAB — C-REACTIVE PROTEIN: CRP: 6.8 mg/dL — ABNORMAL HIGH (ref <1.0)

## 2021-02-06 LAB — CBC
HCT: 32 % — ABNORMAL LOW (ref 39.0–52.0)
HCT: 29.7 % — ABNORMAL LOW (ref 39.0–52.0)
Hemoglobin: 10.4 g/dL — ABNORMAL LOW (ref 13.0–17.0)
Hemoglobin: 9.5 g/dL — ABNORMAL LOW (ref 13.0–17.0)
MCH: 30.1 pg (ref 26.0–34.0)
MCH: 29.6 pg (ref 26.0–34.0)
MCHC: 32.5 g/dL (ref 30.0–36.0)
MCHC: 32 g/dL (ref 30.0–36.0)
MCV: 92.5 fL (ref 80.0–100.0)
MCV: 92.5 fL (ref 80.0–100.0)
Platelets: 106 10*3/uL — ABNORMAL LOW (ref 150–400)
Platelets: 98 10*3/uL — ABNORMAL LOW (ref 150–400)
RBC: 3.46 MIL/uL — ABNORMAL LOW (ref 4.22–5.81)
RBC: 3.21 MIL/uL — ABNORMAL LOW (ref 4.22–5.81)
RDW: 16.5 % — ABNORMAL HIGH (ref 11.5–15.5)
RDW: 16.1 % — ABNORMAL HIGH (ref 11.5–15.5)
WBC: 4.9 10*3/uL (ref 4.0–10.5)
WBC: 4.8 10*3/uL (ref 4.0–10.5)
nRBC: 0 % (ref 0.0–0.2)
nRBC: 0 % (ref 0.0–0.2)

## 2021-02-06 LAB — HEMOGLOBIN AND HEMATOCRIT, BLOOD
HCT: 32.1 % — ABNORMAL LOW (ref 39.0–52.0)
Hemoglobin: 10.2 g/dL — ABNORMAL LOW (ref 13.0–17.0)

## 2021-02-06 LAB — URINE DRUG SCREEN, QUALITATIVE (ARMC ONLY)
Amphetamines, Ur Screen: NOT DETECTED
Barbiturates, Ur Screen: NOT DETECTED
Benzodiazepine, Ur Scrn: NOT DETECTED
Cannabinoid 50 Ng, Ur ~~LOC~~: NOT DETECTED
Cocaine Metabolite,Ur ~~LOC~~: NOT DETECTED
MDMA (Ecstasy)Ur Screen: NOT DETECTED
Methadone Scn, Ur: NOT DETECTED
Opiate, Ur Screen: NOT DETECTED
Phencyclidine (PCP) Ur S: NOT DETECTED
Tricyclic, Ur Screen: NOT DETECTED

## 2021-02-06 LAB — IRON AND TIBC
Iron: 26 ug/dL — ABNORMAL LOW (ref 45–182)
Saturation Ratios: 18 % (ref 17.9–39.5)
TIBC: 144 ug/dL — ABNORMAL LOW (ref 250–450)
UIBC: 118 ug/dL

## 2021-02-06 LAB — HEPATIC FUNCTION PANEL
ALT: 19 U/L (ref 0–44)
AST: 35 U/L (ref 15–41)
Albumin: 2.6 g/dL — ABNORMAL LOW (ref 3.5–5.0)
Alkaline Phosphatase: 79 U/L (ref 38–126)
Bilirubin, Direct: 0.1 mg/dL (ref 0.0–0.2)
Indirect Bilirubin: 0.7 mg/dL (ref 0.3–0.9)
Total Bilirubin: 0.8 mg/dL (ref 0.3–1.2)
Total Protein: 5.9 g/dL — ABNORMAL LOW (ref 6.5–8.1)

## 2021-02-06 LAB — AMMONIA: Ammonia: 15 umol/L (ref 9–35)

## 2021-02-06 LAB — TYPE AND SCREEN
ABO/RH(D): O POS
Antibody Screen: NEGATIVE

## 2021-02-06 LAB — BASIC METABOLIC PANEL
Anion gap: 12 (ref 5–15)
BUN: 36 mg/dL — ABNORMAL HIGH (ref 6–20)
CO2: 22 mmol/L (ref 22–32)
Calcium: 7.8 mg/dL — ABNORMAL LOW (ref 8.9–10.3)
Chloride: 101 mmol/L (ref 98–111)
Creatinine, Ser: 5.89 mg/dL — ABNORMAL HIGH (ref 0.61–1.24)
GFR, Estimated: 10 mL/min — ABNORMAL LOW (ref >60)
Glucose, Bld: 98 mg/dL (ref 70–99)
Potassium: 3.6 mmol/L (ref 3.5–5.1)
Sodium: 135 mmol/L (ref 135–145)

## 2021-02-06 LAB — RETICULOCYTES
Immature Retic Fract: 11.9 % (ref 2.3–15.9)
RBC.: 3.22 MIL/uL — ABNORMAL LOW (ref 4.22–5.81)
Retic Count, Absolute: 38.6 10*3/uL (ref 19.0–186.0)
Retic Ct Pct: 1.2 % (ref 0.4–3.1)

## 2021-02-06 LAB — PROCALCITONIN: Procalcitonin: 0.73 ng/mL

## 2021-02-06 LAB — RESP PANEL BY RT-PCR (FLU A&B, COVID) ARPGX2
Influenza A by PCR: NEGATIVE
Influenza B by PCR: NEGATIVE
SARS Coronavirus 2 by RT PCR: POSITIVE — AB

## 2021-02-06 LAB — LACTATE DEHYDROGENASE: LDH: 290 U/L — ABNORMAL HIGH (ref 98–192)

## 2021-02-06 LAB — PROTIME-INR
INR: 1.3 — ABNORMAL HIGH (ref 0.8–1.2)
Prothrombin Time: 15.5 seconds — ABNORMAL HIGH (ref 11.4–15.2)

## 2021-02-06 LAB — FIBRINOGEN: Fibrinogen: 384 mg/dL (ref 210–475)

## 2021-02-06 LAB — FERRITIN: Ferritin: 1352 ng/mL — ABNORMAL HIGH (ref 24–336)

## 2021-02-06 LAB — MAGNESIUM: Magnesium: 2 mg/dL (ref 1.7–2.4)

## 2021-02-06 LAB — ETHANOL: Alcohol, Ethyl (B): <10 mg/dL (ref <10)

## 2021-02-06 LAB — FOLATE: Folate: 15.4 ng/mL (ref >5.9)

## 2021-02-06 MED ORDER — INSULIN ASPART 100 UNIT/ML ~~LOC~~ SOLN
0.0000 [IU] | Freq: Four times a day (QID) | SUBCUTANEOUS | Status: DC
  Administered 2021-02-07: 1 [IU] via SUBCUTANEOUS

## 2021-02-06 MED ORDER — PANTOPRAZOLE SODIUM 40 MG IV SOLR
40.0000 mg | Freq: Two times a day (BID) | INTRAVENOUS | Status: DC
  Administered 2021-02-06 – 2021-02-09 (×6): 40 mg via INTRAVENOUS
  Filled 2021-02-06 (×6): qty 40

## 2021-02-06 MED ORDER — ATORVASTATIN CALCIUM 20 MG PO TABS
80.0000 mg | ORAL_TABLET | Freq: Every day | ORAL | Status: DC
  Administered 2021-02-06 – 2021-02-08 (×3): 80 mg via ORAL
  Filled 2021-02-06 (×3): qty 4

## 2021-02-06 MED ORDER — SODIUM CHLORIDE 0.9 % IV SOLN
100.0000 mg | Freq: Every day | INTRAVENOUS | Status: AC
  Administered 2021-02-07 – 2021-02-08 (×2): 100 mg via INTRAVENOUS
  Filled 2021-02-06 (×2): qty 100

## 2021-02-06 MED ORDER — SODIUM CHLORIDE 0.9 % IV SOLN
1.0000 g | Freq: Once | INTRAVENOUS | Status: AC
  Administered 2021-02-06: 1 g via INTRAVENOUS
  Filled 2021-02-06: qty 10

## 2021-02-06 MED ORDER — SODIUM CHLORIDE 0.9 % IV SOLN
1.0000 g | INTRAVENOUS | Status: DC
  Administered 2021-02-07: 1 g via INTRAVENOUS
  Filled 2021-02-06: qty 10
  Filled 2021-02-06: qty 1

## 2021-02-06 MED ORDER — ALBUTEROL SULFATE HFA 108 (90 BASE) MCG/ACT IN AERS
1.0000 | INHALATION_SPRAY | RESPIRATORY_TRACT | Status: DC | PRN
  Filled 2021-02-06: qty 6.7

## 2021-02-06 MED ORDER — SODIUM CHLORIDE 0.9 % IV BOLUS
500.0000 mL | Freq: Once | INTRAVENOUS | Status: AC
  Administered 2021-02-06: 500 mL via INTRAVENOUS

## 2021-02-06 MED ORDER — BENZONATATE 100 MG PO CAPS: 100.0000 mg | ORAL_CAPSULE | Freq: Two times a day (BID) | ORAL | Status: DC | PRN

## 2021-02-06 MED ORDER — ACETAMINOPHEN 325 MG PO TABS
650.0000 mg | ORAL_TABLET | Freq: Four times a day (QID) | ORAL | Status: DC | PRN
  Administered 2021-02-09: 650 mg via ORAL
  Filled 2021-02-06: qty 2

## 2021-02-06 MED ORDER — SODIUM CHLORIDE 0.9 % IV SOLN
200.0000 mg | Freq: Once | INTRAVENOUS | Status: AC
  Administered 2021-02-06: 200 mg via INTRAVENOUS
  Filled 2021-02-06: qty 200

## 2021-02-06 MED ORDER — LINAGLIPTIN 5 MG PO TABS
5.0000 mg | ORAL_TABLET | Freq: Every day | ORAL | Status: DC
  Administered 2021-02-06 – 2021-02-07 (×2): 5 mg via ORAL
  Filled 2021-02-06 (×4): qty 1

## 2021-02-06 MED ORDER — FENTANYL CITRATE (PF) 100 MCG/2ML IJ SOLN
25.0000 ug | INTRAMUSCULAR | Status: DC | PRN
  Administered 2021-02-09: 25 ug via INTRAVENOUS
  Filled 2021-02-06 (×2): qty 2

## 2021-02-06 MED ORDER — ACETAMINOPHEN 650 MG RE SUPP: 650.0000 mg | Freq: Four times a day (QID) | RECTAL | Status: DC | PRN

## 2021-02-06 NOTE — ED Triage Notes (Signed)
Pt c/o black tarry stools for the past couple of days with no abd pain, no N/V.Richard Zavala pt also c/o no being able to pass urine for the past 3-4 days, only a "dribble" at a time.. pt is in NAD.Richard Zavala

## 2021-02-06 NOTE — ED Notes (Signed)
Patient reports he is hungry, MD states patient may eat, dinner tray ordered. MD now at bedside.

## 2021-02-06 NOTE — H&P (Signed)
History and Physical    PLEASE NOTE THAT DRAGON DICTATION SOFTWARE WAS USED IN THE CONSTRUCTION OF THIS NOTE.   Richard Zavala NOB:[id number] DOB: Oct 26, 1961 DOA: 02/06/2021  PCP: Midge Minium, PA Patient coming from: home   I have personally briefly reviewed patient's old medical records in [place]  Chief Complaint: Dark-colored stool  HPI: Richard Zavala is a 60 y.o. male with medical history significant for end-stage renal disease on hemodialysis on Monday, Wednesday, Friday schedule, hypertension, hyperlipidemia, type 2 diabetes mellitus, chronic diastolic heart failure, who is admitted to [facility] on 02/06/2021 with suspected acute upper GI bleed after presenting from home to [facility] ED complaining of dark-colored stool.   The patient reports a total of 3-4 episodes of dark-colored stool over the last 2 to 3 days in the absence of any associated hematochezia.  He reports associated suprapubic discomfort over that timeframe, but denies any associated nausea, vomiting, hematemesis.  No preceding trauma or travel.  Denies any associated fever, chills, rigors, or generalized myalgias.  Denies any associated chest pain, palpitations, presyncope, or syncope.   Reports that he is on no blood thinners at home, including no aspirin.  Denies any recent use of NSAIDs, and denies any recent alcohol consumption.  He also denies any known underlying chronic liver pathology.  Denies recent use of any of the following medications: Systemic steroids, supplemental oral potassium, or supplemental p.o. iron.   She reports a very similar presentation in March 2021, for which she reports undergoing EGD at [facility], although he is unsure as to the results of this endoscopic evaluation.  In the interval from March 2021 at present, the patient denies any interval instances of dark-colored stool up until onset 3 days ago.   Additionally, with the patient has end-stage renal  disease on hemodialysis, he reports that he continues to produce urine.  However, over the last 2 to 3 days he notes new onset urinary urgency, but with diminished urine output.  The after mentioned suprapubic discomfort is associated with his urinary urgency.  Denies any overt dysuria or gross hematuria.  He notes that he was recently attended to his scheduled hemodialysis session yesterday, 02/05/21.   He also notes 3 to 4 days of shortness of breath associated with new onset nonproductive cough.  Denies any associated orthopnea, PND, or new onset peripheral edema.  He also denies any associated hemoptysis, calf tenderness, Emmet Messer new onset peripheral erythema.  Not associate with any chest pain.  Denies any recent trauma, travel, surgical procedures, or periods of prolonged diminished activity.  Denies any associated headache, neck stiffness, rhinitis, rhinorrhea, sore throat, rash.  No recent known COVID-19 exposures.  Denies any known chronic underlying pulmonary pathology.   Per chart review, it appears that patient's baseline hemoglobin is around 13, with most recent prior hemoglobin noted to be 12.9 when checked in March 2021.    ED Course:  Vital signs in the ED were notable for the following: Temperature max 98.2; heart rate 58-61; blood pressure 121/94 - 144/53; respiratory rate 15-19, oxygen saturation 96 to 90% on room air.  Labs were notable for the following: CMP was notable for the following: Sodium 135, potassium 3.6, bicarbonate 22, anion gap 12, glucose 98.  Liver enzymes were found to be within normal limits, with ALT specifically found to be 19.  CBC notable for white blood cell count 4900, hemoglobin 10.4, platelet count 98.  Repeat CBC performed a few hours after initial  presentation to the ED today reflected a trend down in hemoglobin to 9.5, but in the context of interval administration of IV fluids, as further described below.  The second CBC was associated with normocytic,  normochromic findings as well as a mildly elevated RDW of 16.1.  Nasopharyngeal COVID-19 PCR performed in the ED today was found to be positive.  EKG, by way of comparison to most recent prior from March 2021 showed sinus rhythm with heart rate 57, no evidence of ST or T wave changes, including no evidence of ST elevation, and unchanged Q waves in V1/V2.  Chest x-ray showed bilateral hazy airspace and interstitial opacities as well as small bilateral pleural effusions in the absence of evidence of pneumothorax.  CT abdomen/pelvis without contrast showed cholelithiasis without collateral wall thickening, distention, pericholecystic fluid, or common bile duct dilation.  CT abdomen/pelvis also showed evidence of bladder wall thickening consistent with acute cystitis, but otherwise showed no evidence of acute intra-abdominal process.  While in the ED, the following were administered: DRE revealed brown-colored stool that was found to be guaiac positive.  Additionally, while in the ED the patient received Rocephin 1 g IV x1, normal saline x500 cc bolus, and the patient was typed and screened for 2 units PRBC.  Subsequently, he was admitted to the med telemetry floor for further evaluation management of suspected presenting acute upper gastrointestinal bleed.    Review of Systems: As per HPI otherwise 10 point review of systems negative.   Past Medical History:  Diagnosis Date  . Anemia   . Blind   . BPH (benign prostatic hyperplasia)   . CAD (coronary artery disease)   . CHF (congestive heart failure) ([place])   . Chronic kidney disease (CKD), stage IV (severe) (HCC)    DIALYSIS  . Diabetes mellitus without complication ([place])   . ESRD (end stage renal disease) ([place])    Monday-Wednesday-Friday dialysis  . Hyperlipemia   . Hypertension   . Neuropathy   . Osteoporosis   . Pulmonary HTN ([place])   . Stroke [facility])    2013  . TIA (transient ischemic attack)   . TRD (traction retinal detachment)   .  Wilms' tumor [facility])     Past Surgical History:  Procedure Laterality Date  . A/V FISTULAGRAM Left 01/28/2017   Procedure: A/V Fistulagram;  Surgeon: Algernon Huxley, MD;  Location: [place] [facility];  Service: Cardiovascular;  Laterality: Left;  . A/V FISTULAGRAM Left 04/28/2018   Procedure: A/V FISTULAGRAM;  Surgeon: Algernon Huxley, MD;  Location: [place] [facility];  Service: Cardiovascular;  Laterality: Left;  . A/V FISTULAGRAM N/A 03/21/2020   Procedure: A/V FISTULAGRAM;  Surgeon: Algernon Huxley, MD;  Location: [place] [facility];  Service: Cardiovascular;  Laterality: N/A;  . AV FISTULA PLACEMENT    . AV FISTULA PLACEMENT Right 11/23/2019   Procedure: ARTERIOVENOUS (AV) FISTULA CREATION (RADIOCEPHALIC );  Surgeon: Algernon Huxley, MD;  Location: [facility] ORS;  Service: Vascular;  Laterality: Right;  . CARDIAC CATHETERIZATION    . CATARACT EXTRACTION W/PHACO Right 05/14/2016   Procedure: CATARACT EXTRACTION PHACO AND INTRAOCULAR LENS PLACEMENT (IOC);  Surgeon: Eulogio Bear, MD;  Location: [facility] ORS;  Service: Ophthalmology;  Laterality: Right;  [place] 1.46 AP% 11.5 CDE 12.33 FLUID PACK LOT # [id number]  . EYE SURGERY    . FRACTURE SURGERY     RIGHT LEG WITH ROD  . HIP SURGERY    . NEPHRECTOMY RADICAL    . PERIPHERAL VASCULAR  CATHETERIZATION N/A 08/28/2015   Procedure: A/V Shuntogram/Fistulagram;  Surgeon: Algernon Huxley, MD;  Location: [place] [facility];  Service: Cardiovascular;  Laterality: N/A;  . PERIPHERAL VASCULAR CATHETERIZATION Left 08/28/2015   Procedure: A/V Shunt Intervention;  Surgeon: Algernon Huxley, MD;  Location: [place] [facility];  Service: Cardiovascular;  Laterality: Left;  . UPPER EXTREMITY ANGIOGRAPHY Right 08/01/2020   Procedure: UPPER EXTREMITY ANGIOGRAPHY;  Surgeon: Algernon Huxley, MD;  Location: [place] [facility];  Service: Cardiovascular;  Laterality: Right;    Social History:  reports that he quit smoking about 8 years ago. His smoking use included cigarettes. He smoked 1.00  pack per day. He has never used smokeless tobacco. He reports that he does not drink alcohol and does not use drugs.   Allergies  Allergen Reactions  . Metformin Other (See Comments)    Severe diarrhea   . Penicillins Hives and Other (See Comments)    Has patient had a PCN reaction causing immediate rash, facial/tongue/throat swelling, SOB or lightheadedness with hypotension:Yes Has patient had a PCN reaction causing severe rash involving mucus membranes or skin necrosis:Yes Has patient had a PCN reaction that required hospitalization:inpatient at the time of reaction. Has patient had a PCN reaction occurring within the last 10 years:No If all of the above answers are "NO", then may proceed with Cephalosporin use.     Family History  Problem Relation Age of Onset  . CAD Father   . COPD Father   . CAD Paternal Grandfather   . Diabetes Maternal Aunt   . Diabetes Maternal Uncle      Prior to Admission medications   Medication Sig Start Date End Date Taking? Authorizing Provider  amLODipine (NORVASC) 10 MG tablet Take 1 tablet (10 mg total) by mouth at bedtime. 08/17/15  Yes Epifanio Lesches, MD  atorvastatin (LIPITOR) 80 MG tablet Take 1 tablet (80 mg total) by mouth daily. Patient taking differently: Take 80 mg by mouth at bedtime. 08/22/16  Yes Demetrios Loll, MD  carvedilol (COREG) 12.5 MG tablet Take 37.5 mg by mouth 2 (two) times daily.   Yes [provider]  furosemide (LASIX) 40 MG tablet Take 40 mg by mouth See admin instructions. Take 40 mg by mouth twice daily on Tuesday, Thursday, Saturday and Sunday.   Yes [provider]  hydrALAZINE (APRESOLINE) 100 MG tablet Take 100 mg by mouth 3 (three) times daily.   Yes [provider]  insulin glargine (LANTUS) 100 UNIT/ML injection Inject 9-10 Units into the skin daily.    Yes [provider]  isosorbide mononitrate (IMDUR) 30 MG 24 hr tablet Take 1 tablet (30 mg total) by mouth daily. Patient  taking differently: Take 30 mg by mouth at bedtime. 08/17/15  Yes Epifanio Lesches, MD  pantoprazole (PROTONIX) 40 MG tablet Take 40 mg by mouth 2 (two) times daily before a meal. 01/14/21  Yes [provider]     Objective    Physical Exam: Vitals:   02/06/21 1212 02/06/21 1330 02/06/21 1610 02/06/21 1753  BP: (!) 121/94 90/64 (!) 126/91 (!) 144/53  Pulse: (!) 59 (!) 58 60 61  Resp: 18 18 19 15   Temp: 98.2 F (36.8 C)     TempSrc: Oral     SpO2: 95% 96% 98% 98%  Weight: 72 kg     Height: 5' 8"  (1.727 m)       General: appears to be stated age; alert, oriented Skin: warm, dry, no rash Head:  AT/ Mouth:  Oral mucosa membranes appear moist, normal dentition Neck: supple; trachea midline Heart:  RRR; did not appreciate any M/R/G Lungs: CTAB, did not appreciate any wheezes, rales, or rhonchi Abdomen: + BS; soft, ND; mild suprapubic tenderness in the absence of associated guarding, rigidity, or rebound tenderness. Vascular: 2+ pedal pulses b/l; 2+ radial pulses b/l Extremities: trace edema b/l LE's; no muscle wasting Neuro: strength and sensation intact in upper and lower extremities b/l    Labs on Admission: I have personally reviewed following labs and imaging studies  CBC: Recent Labs  Lab 02/06/21 1217 02/06/21 1606  WBC 4.9 4.8  HGB 10.4* 9.5*  HCT 32.0* 29.7*  MCV 92.5 92.5  PLT 106* 98*   Basic Metabolic Panel: Recent Labs  Lab 02/06/21 1217 02/06/21 1253  NA 135  --   K 3.6  --   CL 101  --   CO2 22  --   GLUCOSE 98  --   BUN 36*  --   CREATININE 5.89*  --   CALCIUM 7.8*  --   MG  --  2.0   GFR: Estimated Creatinine Clearance: 13.4 mL/min (A) (by C-G formula based on SCr of 5.89 mg/dL (H)). Liver Function Tests: Recent Labs  Lab 02/06/21 1248  AST 35  ALT 19  ALKPHOS 79  BILITOT 0.8  PROT 5.9*  ALBUMIN 2.6*   No results for input(s): LIPASE, AMYLASE in the last 168 hours. Recent Labs  Lab 02/06/21 1249  AMMONIA 15    Coagulation Profile: No results for input(s): INR, PROTIME in the last 168 hours. Cardiac Enzymes: No results for input(s): CKTOTAL, CKMB, CKMBINDEX, TROPONINI in the last 168 hours. BNP (last 3 results) No results for input(s): PROBNP in the last 8760 hours. HbA1C: No results for input(s): HGBA1C in the last 72 hours. CBG: No results for input(s): GLUCAP in the last 168 hours. Lipid Profile: No results for input(s): CHOL, HDL, LDLCALC, TRIG, CHOLHDL, LDLDIRECT in the last 72 hours. Thyroid Function Tests: No results for input(s): TSH, T4TOTAL, FREET4, T3FREE, THYROIDAB in the last 72 hours. Anemia Panel: No results for input(s): VITAMINB12, FOLATE, FERRITIN, TIBC, IRON, RETICCTPCT in the last 72 hours. Urine analysis:    Component Value Date/Time   COLORURINE Amber 10/27/2012 1056   APPEARANCEUR Turbid 10/27/2012 1056   LABSPEC 1.017 10/27/2012 1056   PHURINE 5.0 10/27/2012 1056   GLUCOSEU >=500 10/27/2012 1056   HGBUR 2+ 10/27/2012 1056   BILIRUBINUR Negative 10/27/2012 1056   KETONESUR Negative 10/27/2012 1056   PROTEINUR >=500 10/27/2012 1056   NITRITE Negative 10/27/2012 1056   LEUKOCYTESUR Negative 10/27/2012 1056    Radiological Exams on Admission: CT ABDOMEN PELVIS WO CONTRAST  Result Date: 02/06/2021 CLINICAL DATA:  Flank pain.  Black tarry stools. EXAM: CT ABDOMEN AND PELVIS WITHOUT CONTRAST TECHNIQUE: Multidetector CT imaging of the abdomen and pelvis was performed following the standard protocol without IV contrast. COMPARISON:  CT dated March 07, 2020 FINDINGS: Lower chest: There are ground-glass airspace opacities at the lung bases bilaterally, right worse than left. There is interlobular septal thickening. There are small bilateral pleural effusions, right greater than left.There is cardiomegaly. The intracardiac blood pool is hypodense relative to the adjacent myocardium consistent with anemia. Hepatobiliary: The liver is normal. Cholelithiasis without acute  inflammation.There is no biliary ductal dilation. Pancreas: The pancreas was poorly evaluated secondary to lack of IV contrast and lack of intra-abdominal fat. Spleen: Unremarkable. Adrenals/Urinary Tract: --Adrenal glands: Unremarkable. --Right kidney/ureter: The right kidney is  atrophic without evidence for hydronephrosis. --Left kidney/ureter: The patient is status post left-sided nephrectomy. --Urinary bladder: There is mild bladder wall thickening. Stomach/Bowel: --Stomach/Duodenum: No hiatal hernia or other gastric abnormality. Normal duodenal course and caliber. --Small bowel: Unremarkable. --Colon: Unremarkable. --Appendix: Normal. Vascular/Lymphatic: Atherosclerotic calcification is present within the non-aneurysmal abdominal aorta, without hemodynamically significant stenosis. --No retroperitoneal lymphadenopathy. --No mesenteric lymphadenopathy. --No pelvic or inguinal lymphadenopathy. Reproductive: Unremarkable Other: No ascites or free air. There are bilateral fat containing inguinal hernias. Musculoskeletal. There is a severe compression fracture of the T12 vertebral body. This is essentially unchanged from prior studies. There is no new acute compression fracture. There is osteopenia. IMPRESSION: 1. Ground-glass airspace opacities at the lung bases bilaterally, right worse than left, with interlobular septal thickening and small bilateral pleural effusions. Findings are suggestive of developing pulmonary edema. An atypical infectious process is not excluded. 2. Cholelithiasis without acute inflammation. 3. Mild bladder wall thickening. Recommend correlation with urinalysis to exclude cystitis. 4. Cardiomegaly. 5. Anemia. 6. Status post prior left nephrectomy. 7. Unchanged compression fracture of the T12 vertebral body. Aortic Atherosclerosis (ICD10-I70.0). Electronically Signed   By: Constance Holster M.D.   On: 02/06/2021 15:13   DG Chest Portable 1 View  Result Date: 02/06/2021 CLINICAL DATA:   COVID positive EXAM: PORTABLE CHEST 1 VIEW COMPARISON:  03/07/2020 FINDINGS: Cardiomegaly with vascular congestion. Small bilateral effusions. Diffuse bilateral interstitial and hazy pulmonary opacity, suspect pulmonary edema. No pneumothorax. IMPRESSION: Cardiomegaly with vascular congestion, small bilateral effusions and diffuse bilateral interstitial and hazy pulmonary opacity, suspect for pulmonary edema. Electronically Signed   By: Donavan Foil M.D.   On: 02/06/2021 18:19     EKG: Independently reviewed, with result as described above.    Assessment/Plan   MCKENNA BORUFF is a 60 y.o. male with medical history significant for end-stage renal disease on hemodialysis on Monday, Wednesday, Friday schedule, hypertension, hyperlipidemia, type 2 diabetes mellitus, chronic diastolic heart failure, who is admitted to [facility] on 02/06/2021 with suspected acute upper GI bleed after presenting from home to [facility] ED complaining of dark-colored stool.    Principal Problem:   Acute upper GI bleed Active Problems:   End stage renal disease (HCC)   Essential hypertension   Type 2 diabetes mellitus with kidney complication, with long-term current use of insulin (HCC)   Acute cystitis   Acute blood loss anemia   COVID-19 virus infection       #) Acute Upper GI Bleed: diagnosis on the basis of 2 to 3 days of dark-colored stool, with DRE performed in the ED today associated with guaiac positive finding, and presenting labs reflecting evidence of acute blood loss anemia, as further described below.  Of note, trending of BUN values is of limited utility in this patient with end-stage renal disease.  Not on any blood thinners as an outpatient, including no aspirin.  Denies any NSAID use.  No known history of underlying liver disease, denies any history of alcohol abuse.  The patient reports most recent EGD was performed in March 2021 at [facility], and was prompted by symptoms very similar  to that with which he presents this evening, although he does not recall the specific results of his endoscopic evaluation.  Differential includes hepatitis versus gastritis versus peptic ulcer disease versus AVMs.  In the absence of any known liver disease, initiation of SBP prophylaxis not appear to be warranted.  Additionally, presentation is suggestive of variceal bleed, and therefore there does not appear to be  indication for initiation of octreotide.   At this time, the patient appears hemodynamically stable, with normotensive blood pressures in the absence of any associated tachycardia.  In terms of symptomatology, there may be a contribution to his presenting shortness of breath stemming from the associated acute blood loss anemia, although this may also be on the basis of diagnosis of COVID-19 infection.  Denies any associated chest pain, diaphoresis, dizziness, presyncope, or syncope.  Given the suspected upper gastrointestinal source, will initiate IV Protonix, as further described below.  Will consult GI for additional recommendations.     Plan: NPO MN. Refraining from pharmacologic DVT prophylaxis. Monitor on telemetry. Monitor continuous pulse-ox. Maintain at least 2 large bore IV's. Check INR in the AM. Q4H H&H's have been ordered through 9 AM tomorrow (02/07/21). Will closely monitor these ensuing Hgb levels and correlate these data points with the patient's overall clinical picture including vital signs to determine need for subsequent transfusion.  Patient was typed and screened in the ED this evening.  Protonix 40 mg IV twice daily.  Repeat CBC in the morning.  Additional work-up and management of associated presenting acute blood loss anemia, as further described below.       #) Acute blood loss anemia: in the setting of suspected acute upper GI bleed, as above, presenting Hgb noted to be 10.4, with subsequent further trend down to 9.5 in the setting of administration of IV fluids,  rendering the possibility of hemodilution all contribution to interval decline.  However, this is relative to apparent baseline hemoglobin of approximately 13, with most recent prior hemoglobin data point noted to be 12.9 in March 2021.  Presenting hemoglobin associated normocytic, normochromic findings, but with a mildly elevated RDW consistent with acute blood loss anemia.  At this time, patient appears hemodynamically stable.    Plan: work-up and management for presenting suspected acute upper GI bleed, as above, including close monitoring of Q4H H&H's, with clinical evaluation for determination of need for blood transfusion, as further described above. Monitor on telemetry. Monitor continuous pulse-ox. NPO MN. Refraining from pharmacologic DVT prophylaxis. Check INR.  Add on the following to pretransfusion labs: Total iron, TIBC, ferritin, MMA, folic acid, reticulocyte count, and peripheral smear.  Gastroenterology recommendations, prompting consultation, as above.       #) COVID-19 infection: diagnosis on the basis of 2-3 of new onset shortness of breath associated with nonproductive cough, with nasopharyngeal COVID-19 PCR performed in the ED this evening found to be positive. Of note, presentation does not appear to be associated with acute hypoxic respiratory distress/failure, with patient maintaining O2 sats greater than 94% on room air. Overall, it does not appear that criteria are met at the present time for patient's COVID-19 infection to be considered severe in nature. Consequently, there does not appear to be an indication at this time for initiation of dexamethasone per treatment guidance recommendations from [facility]'s Covid Treatment Guidelines. It is important to note that COVID-19 infection does not represent the admission diagnosis for this hospitalization, and that the patient is not being hospitalized specifically for additional evaluation and management of COVID-19. However, given  that there is evidence that initiation of remdesivir early in the course of infection can decrease the likelihood of progression of the severity of COVID-19 in those that are considered to be at high risk for a more complicated course of the infection, it is important to evaluate if this patient meets criteria for a brief course of remdesivir on this basis.  In the setting of patient's multiple comorbidities that include chronic kidney disease, diabetes, CHF, this patient meets criteria to be considered high risk for a more complicated clinical course of COVID-19 infection, including increased probability for progression of the severity associated with their infectious course. Therefore, even though the patient is not being admitted specifically for further evaluation and management of COVID-19 infection, given that the duration since onset of patient's respiratory symptoms is less than 7 days in the context of the presence of high risk criteria, indications are met for initiation of a 3-day course of remdesivir due to associated benefit of decreased risk for progression of severity of their COVID-19 infection with early initiation of remdesivir in this setting, per treatment guidance recommendations from [place]'s Covid Treatment Guidelines. Of note, ALT found to be less than 220. Therefore, there is no contraindication for initiation of remdesivir on the basis of transaminitis.  No known chronic underlying pulmonary pathology. Denies any known or suspected COVID-19 exposures. will initiate daily linagliptin as DPP-4 inhibitors have been shown to reduce mortality in patients with DM2 and a COVID-19.     Plan: Airborne and contact precautions. Monitor continuous pulse oximetry and monitor on telemetry. prn supplemental O2 to maintain O2 sats greater than or equal to 94%. Proning protocol initiated. PRN albuterol inhaler. PRN acetaminophen for fever. Start remdesivir, as above. Refraining from initiation  of dexamethasone, as above. Check inflammatory markers (fibrinogen, d dimer , crp, ferritin, LDH) with plan to trend these values. Check serum magnesium and phosphorus levels. Check CMP and CBC in the morning. Flutter valve and incentive spirometry. In setting of DM2, will start linagliptin 5 mg PO Qdaily for associated mortality benefit, as above.  Add on procalcitonin.        #) Acute cystitis: Currently this is a presumptive, unconfirmed diagnosis on the basis of patient's report of 1 to 2 days of urinary urgency with new onset suprapubic discomfort, while presenting CT abdomen/pelvis shows findings consistent with acute cystitis.  However, while a urine sample has been collected, the result is currently pending.  Empirically, the patient was started on Rocephin in the ED this evening.  SIRS criteria are not met for sepsis at this time.   Plan: Monitor closely for results of urinalysis.  For now, will continue empiric daily Rocephin, but with plan subsequently discontinue Rocephin if ensuing urinalysis result is not consistent with underlying UTI.  Check blood cultures x2.  Repeat CBC with differential in the morning.  As needed IV fentanyl for associated suprapubic discomfort.     #) End-stage renal disease: On hemodialysis on Monday, Wednesday, Friday schedule, with most recent scheduled hemodialysis session occurring yesterday (02/05/21).  No evidence to suggest indication for urgent overnight dialysis, but rather will consult nephrology for assistance with provision of routine scheduled hemodialysis the patient remains in the hospital.  Plan: Monitor strict I's and O's and daily weights.  Nephrology consult for assistance in arranging for scheduled hemodialysis while in the hospital.  Repeat BMP in the morning.  Check serum magnesium, phosphorus, and ionized calcium levels.      #) Essential hypertension: Outpatient hypertensive regimen includes Norvasc, Coreg, hydralazine, and Imdur.   Systolic blood pressures in the ED thus far have been in the range of the 120s to 140s mmHg. in the setting of suspected presenting acute gastrointestinal bleed, will be conservative in resumption of home antihypertensive regimen.   Plan: For now, will hold home blood pressure medications, as above.  Close monitoring of  ensuing blood pressure via routine vital signs.  Work-up and management of suspected presenting acute upper gastrointestinal bleed, as further described above.      #) Hyperlipidemia: On high intensity atorvastatin as an outpatient.  Plan: Continue home statin.       #) Type 2 diabetes mellitus: On Lantus 10 units subcu daily in the absence of any oral hypoglycemic agents.  Presenting blood sugar per presenting CMP noted to be 98.   Plan: Given plan for n.p.o. at midnight in the setting of presenting acute upper gastrointestinal bleed, I have ordered Accu-Cheks every 6 hours with very low-dose sliding scale insulin.  We will hold home basal insulin for now.      #) Chronic diastolic heart failure: Documented history of such, with most recent echocardiogram performed in April 2019 showing moderate concentric LVH, LVEF 60 to 65%, no evidence of focal wall motion abnormalities, grade 2 diastolic dysfunction, and no evidence of significant valvular pathology.  In the setting of stage renal disease on hemodialysis, the patient is at risk for an element of acute volume overload, particularly if he subsequently requires PRBC transfusion.  Close monitoring of ensuing volume status.   Plan: Monitor strict I's and O's and daily weights.  Repeat CMP in the morning.  Add on serum magnesium. monitor on continuous pulse oximetry.     DVT prophylaxis: scd's  Code Status: Full code Family Communication: none Disposition Plan: Per Rounding Team Admission status: inpatient; med-tele;    Of note, this patient was added by me to the following Admit List/Treatment Team: armcadmits.       PLEASE NOTE THAT DRAGON DICTATION SOFTWARE WAS USED IN THE CONSTRUCTION OF THIS NOTE.   [place] [facility] Pager [phone] From 12PM - 12AM  Otherwise, please contact night-coverage  [email or web address] Password [facility]   02/06/2021, 7:18 PM

## 2021-02-06 NOTE — ED Notes (Addendum)
In and out cath performed, 5ml urine return. MD aware.

## 2021-02-06 NOTE — ED Notes (Signed)
Phlebotomy to draw additional labs

## 2021-02-06 NOTE — ED Provider Notes (Signed)
[facility] Emergency Department Provider Note ____________________________________________   Event Date/Time   First MD Initiated Contact with Patient 02/06/21 1231     (approximate)  I have reviewed the triage vital signs and the nursing notes.   HISTORY  Chief Complaint GI Bleeding and Urinary Retention    HPI Richard Zavala is a 60 y.o. male with PMH as noted below including ESRD, CAD, CHF, CVA, Wilms tumor and DM who presents with decreased urination over the last several days.  The patient reports difficulty initiating the stream, dribbling, and a small amount of urine output.  He reports mild pain to the suprapubic area and bilateral flanks.  He denies associated dysuria, hematuria, or fever.  He also has had dark and somewhat tarry, soft stools over the last several days.  He denies any bright red blood in the stool.  He has no prior history of GI bleeding.  Past Medical History:  Diagnosis Date  . Anemia   . Blind   . BPH (benign prostatic hyperplasia)   . CAD (coronary artery disease)   . CHF (congestive heart failure) ([place])   . Chronic kidney disease (CKD), stage IV (severe) (HCC)    DIALYSIS  . Diabetes mellitus without complication ([place])   . ESRD (end stage renal disease) ([place])    Monday-Wednesday-Friday dialysis  . Hyperlipemia   . Hypertension   . Neuropathy   . Osteoporosis   . Pulmonary HTN ([place])   . Stroke [facility])    2013  . TIA (transient ischemic attack)   . TRD (traction retinal detachment)   . Wilms' tumor [facility])     Patient Active Problem List   Diagnosis Date Noted  . Acute cystitis 02/06/2021  . Gait instability 10/12/2019  . Pulmonary nodule 10/12/2019  . CAD (coronary artery disease), native coronary artery 03/08/2018  . Hyperlipidemia 03/08/2018  . Essential hypertension 03/08/2018  . Wilms' tumor with favorable history 03/01/2018  . Closed fracture of right distal radius 08/22/2016  . TIA (transient ischemic  attack) 08/21/2016  . Encounter for pre-transplant evaluation for kidney transplant 05/17/2016  . Anemia due to other cause   . Hyperkalemia   . Hypoglycemia   . Pain   . End stage renal disease ([place])   . Weakness   . Other chest pain   . Elevated troponin   . Hyperkalemia, diminished renal excretion 08/14/2015  . Hypertensive urgency 03/26/2015  . Anemia in CKD (chronic kidney disease) 12/04/2014  . Chronic kidney disease, stage IV (severe) ([place]) 12/03/2014  . Acute ischemic stroke ([place]) 08/25/2014  . Acute lacunar stroke ([place]) 06/25/2014  . Orthostatic hypotension 06/25/2014  . Diabetic vitreous hemorrhage associated with type 2 diabetes mellitus ([place]) 09/28/2013  . PDR (proliferative diabetic retinopathy) ([place]) 09/28/2013  . History of Wilms' tumor 08/15/2013  . Iron deficiency anemia 07/21/2013  . 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 Heart failure, chronic systolic ([place]) 45/80/9983  . Ischemic cardiomyopathy 03/13/2013  . Pulmonary HTN ([place]) 03/13/2013  . Peripheral edema 02/28/2013  . Diabetic macular edema of both eyes ([place]) 01/16/2013  . Corneal epithelial defect 10/06/2012  . TRD (traction retinal detachment) 09/22/2012  . Vitreous hemorrhage of both eyes ([place]) 07/21/2012  . Closed right hip fracture ([place]) 02/19/2012  . Diabetic sensorimotor polyneuropathy ([place]) 01/27/2012  . Vitamin D deficiency disease 11/30/2011  . Diabetes type 2, uncontrolled ([place]) 07/03/2011  . Type 2 diabetes mellitus with kidney complication, with long-term current use of insulin ([place]) 07/03/2011  .  Osteoporosis with fracture 05/28/2011    Past Surgical History:  Procedure Laterality Date  . A/V FISTULAGRAM Left 01/28/2017   Procedure: A/V Fistulagram;  Surgeon: Algernon Huxley, MD;  Location: [place] [facility];  Service: Cardiovascular;  Laterality: Left;  . A/V FISTULAGRAM Left 04/28/2018   Procedure: A/V FISTULAGRAM;  Surgeon: Algernon Huxley, MD;  Location: [place] [facility];   Service: Cardiovascular;  Laterality: Left;  . A/V FISTULAGRAM N/A 03/21/2020   Procedure: A/V FISTULAGRAM;  Surgeon: Algernon Huxley, MD;  Location: [place] [facility];  Service: Cardiovascular;  Laterality: N/A;  . AV FISTULA PLACEMENT    . AV FISTULA PLACEMENT Right 11/23/2019   Procedure: ARTERIOVENOUS (AV) FISTULA CREATION (RADIOCEPHALIC );  Surgeon: Algernon Huxley, MD;  Location: [facility] ORS;  Service: Vascular;  Laterality: Right;  . CARDIAC CATHETERIZATION    . CATARACT EXTRACTION W/PHACO Right 05/14/2016   Procedure: CATARACT EXTRACTION PHACO AND INTRAOCULAR LENS PLACEMENT (IOC);  Surgeon: Eulogio Bear, MD;  Location: [facility] ORS;  Service: Ophthalmology;  Laterality: Right;  [place] 1.46 AP% 11.5 CDE 12.33 FLUID PACK LOT # [id number]  . EYE SURGERY    . FRACTURE SURGERY     RIGHT LEG WITH ROD  . HIP SURGERY    . NEPHRECTOMY RADICAL    . PERIPHERAL VASCULAR CATHETERIZATION N/A 08/28/2015   Procedure: A/V Shuntogram/Fistulagram;  Surgeon: Algernon Huxley, MD;  Location: [place] [facility] LAB;  Service: Cardiovascular;  Laterality: N/A;  . PERIPHERAL VASCULAR CATHETERIZATION Left 08/28/2015   Procedure: A/V Shunt Intervention;  Surgeon: Algernon Huxley, MD;  Location: [place] [facility];  Service: Cardiovascular;  Laterality: Left;  . UPPER EXTREMITY ANGIOGRAPHY Right 08/01/2020   Procedure: UPPER EXTREMITY ANGIOGRAPHY;  Surgeon: Algernon Huxley, MD;  Location: [place] [facility] LAB;  Service: Cardiovascular;  Laterality: Right;    Prior to Admission medications   Medication Sig Start Date End Date Taking? Authorizing Provider  amLODipine (NORVASC) 10 MG tablet Take 1 tablet (10 mg total) by mouth at bedtime. 08/17/15  Yes Epifanio Lesches, MD  atorvastatin (LIPITOR) 80 MG tablet Take 1 tablet (80 mg total) by mouth daily. Patient taking differently: Take 80 mg by mouth at bedtime. 08/22/16  Yes Demetrios Loll, MD  carvedilol (COREG) 12.5 MG tablet Take 37.5 mg by mouth 2 (two) times daily.   Yes [provider]  furosemide (LASIX) 40 MG tablet Take 40 mg by mouth See admin instructions. Take 40 mg by mouth twice daily on Tuesday, Thursday, Saturday and Sunday.   Yes [provider]  hydrALAZINE (APRESOLINE) 100 MG tablet Take 100 mg by mouth 3 (three) times daily.   Yes [provider]  insulin glargine (LANTUS) 100 UNIT/ML injection Inject 9-10 Units into the skin daily.    Yes [provider]  isosorbide mononitrate (IMDUR) 30 MG 24 hr tablet Take 1 tablet (30 mg total) by mouth daily. Patient taking differently: Take 30 mg by mouth at bedtime. 08/17/15  Yes Epifanio Lesches, MD  pantoprazole (PROTONIX) 40 MG tablet Take 40 mg by mouth 2 (two) times daily before a meal. 01/14/21  Yes [provider]    Allergies Metformin and Penicillins  Family History  Problem Relation Age of Onset  . CAD Father   . COPD Father   . CAD Paternal Grandfather   . Diabetes Maternal Aunt   . Diabetes Maternal Uncle     Social History Social History   Tobacco Use  . Smoking status:  Former Smoker    Packs/day: 1.00    Types: Cigarettes    Quit date: 06/26/2012    Years since quitting: 8.6  . Smokeless tobacco: Never Used  Vaping Use  . Vaping Use: Never used  Substance Use Topics  . Alcohol use: No  . Drug use: No    Review of Systems  Constitutional: No fever. Eyes: No redness. ENT: No sore throat. Cardiovascular: Denies chest pain. Respiratory: Denies shortness of breath. Gastrointestinal: No vomiting.  Positive for dark stools. Genitourinary: Positive for urinary hesitancy. Musculoskeletal: Negative for back pain. Skin: Negative for rash. Neurological: Negative for headache.   ____________________________________________   PHYSICAL EXAM:  VITAL SIGNS: ED Triage Vitals  Enc Vitals Group     BP 02/06/21 1212 (!) 121/94     Pulse Rate 02/06/21 1212 (!) 59     Resp 02/06/21 1212 18     Temp 02/06/21 1212 98.2 F (36.8 C)     Temp  Source 02/06/21 1212 Oral     SpO2 02/06/21 1212 95 %     Weight 02/06/21 1212 158 lb 11.7 oz (72 kg)     Height 02/06/21 1212 5\' 8"  (1.727 m)     Head Circumference --      Peak Flow --      Pain Score 02/06/21 1211 4     Pain Loc --      Pain Edu? --      Excl. in [place]? --     Constitutional: Alert and oriented.  Somewhat chronically ill and weak appearing but in no acute distress. Eyes: Conjunctivae are normal.  No scleral icterus. Head: Atraumatic. Nose: No congestion/rhinnorhea. Mouth/Throat: Mucous membranes are dry.   Neck: Normal range of motion.  Cardiovascular: Normal rate, regular rhythm. Grossly normal heart sounds.  Good peripheral circulation. Respiratory: Normal respiratory effort.  No retractions. Lungs CTAB. Gastrointestinal: Soft and nontender. No distention.  Brown stool guaiac positive on DRE. Genitourinary: No flank tenderness. Musculoskeletal: No lower extremity edema.  Extremities warm and well perfused.  Neurologic: Possibly mildly slurred speech, normal language.  Motor intact in all extremities.  No gross focal neurologic deficits are appreciated.  Skin:  Skin is warm and dry. No rash noted. Psychiatric: Calm and cooperative.  ____________________________________________   LABS (all labs ordered are listed, but only abnormal results are displayed)  Labs Reviewed  RESP PANEL BY RT-PCR (FLU A&B, COVID) ARPGX2 - Abnormal; Notable for the following components:      Result Value   SARS Coronavirus 2 by RT PCR POSITIVE (*)    All other components within normal limits  BASIC METABOLIC PANEL - Abnormal; Notable for the following components:   BUN 36 (*)    Creatinine, Ser 5.89 (*)    Calcium 7.8 (*)    GFR, Estimated 10 (*)    All other components within normal limits  CBC - Abnormal; Notable for the following components:   RBC 3.46 (*)    Hemoglobin 10.4 (*)    HCT 32.0 (*)    RDW 16.5 (*)    Platelets 106 (*)    All other components within normal  limits  HEPATIC FUNCTION PANEL - Abnormal; Notable for the following components:   Total Protein 5.9 (*)    Albumin 2.6 (*)    All other components within normal limits  CBC - Abnormal; Notable for the following components:   RBC 3.21 (*)    Hemoglobin 9.5 (*)    HCT 29.7 (*)  RDW 16.1 (*)    Platelets 98 (*)    All other components within normal limits  RETICULOCYTES - Abnormal; Notable for the following components:   RBC. 3.22 (*)    All other components within normal limits  CULTURE, BLOOD (ROUTINE X 2)  CULTURE, BLOOD (ROUTINE X 2)  URINE DRUG SCREEN, QUALITATIVE ([facility] ONLY)  ETHANOL  AMMONIA  MAGNESIUM  URINALYSIS, COMPLETE (UACMP) WITH MICROSCOPIC  CALCIUM, IONIZED  HIV ANTIBODY (ROUTINE TESTING W REFLEX)  PROTIME-INR  PROTIME-INR  FERRITIN  FOLATE  IRON AND TIBC  HEMOGLOBIN AND HEMATOCRIT, BLOOD  HEMOGLOBIN AND HEMATOCRIT, BLOOD  PHOSPHORUS  MAGNESIUM  COMPREHENSIVE METABOLIC PANEL  CBC WITH DIFFERENTIAL/PLATELET  C-REACTIVE PROTEIN  FIBRINOGEN  LACTATE DEHYDROGENASE  C-REACTIVE PROTEIN  FERRITIN  FIBRINOGEN  LACTATE DEHYDROGENASE  METHYLMALONIC ACID, SERUM  PATHOLOGIST SMEAR REVIEW  PROCALCITONIN  TYPE AND SCREEN   ____________________________________________  EKG  ED ECG REPORT I, Arta Silence, the attending physician, personally viewed and interpreted this ECG.  Date: 02/06/2021 EKG Time: 1234 Rate: 58 Rhythm: normal sinus rhythm QRS Axis: normal Intervals: Nonspecific IVCD ST/T Wave abnormalities: normal Narrative Interpretation: no evidence of acute ischemia  ____________________________________________  RADIOLOGY  CT abdomen/pelvis: Bladder wall thickening.  Groundglass opacities and effusions of bilateral lower lungs.  ____________________________________________   PROCEDURES  Procedure(s) performed: No  Procedures  Critical Care performed: No ____________________________________________   INITIAL IMPRESSION /  ASSESSMENT AND PLAN / ED COURSE  Pertinent labs & imaging results that were available during my care of the patient were reviewed by me and considered in my medical decision making (see chart for details).  60 year old male with PMH as noted above presents with decreased urination, hesitancy, dribbling over the last several days along with dark tarry stools.  He denies any significant systemic symptoms.  I reviewed the past medical records in Center.  The patient was most recently seen in the ED in March 2021 with flank pain and shortness of breath and was determined to have likely abdominal wall spasm.  Work-up was reassuring.  He has had no recent admissions.  On exam the patient is somewhat chronically ill and weak appearing but in no acute distress.  He is alert and oriented but seems somewhat tired and slow to respond to certain questions and at times his speech appears mildly slurred.  Neurologic exam is otherwise normal.  The abdomen is soft and nontender.  Exam is otherwise unremarkable for focal findings.  Bladder scan shows 15 mL of urine.  1.  Decreased urination: The patient has a history of ESRD but apparently still makes urine.  Differential includes dehydration or other prerenal cause, acute kidney injury, worsening chronic kidney disease, or less likely UTI, ureteral stone, or other obstructive etiology.  We will obtain lab work-up and a urinalysis.  2.  Dark/tarry stool: DRE reveals stool that is brown but guaiac positive.  Differential includes gastritis, PUD, diverticulosis, internal hemorrhoid, or less likely malignancy.  We will obtain labs including serial CBCs.  ----------------------------------------- 5:52 PM on 02/06/2021 -----------------------------------------  Initial hemoglobin was 10.4, lower than on the last labs about a year ago.  Repeat after 4 hours was 9.5 although this was after the patient received some fluids.  Patient still was unable to provide a urine  sample and only minimal urine was obtained on catheterization.  Because of the patient's complaint of suprapubic and flank pain I proceeded with a CT for further evaluation.  This revealed findings consistent with cystitis.  I started empiric antibiotics  for a UTI.  In addition, the CT revealed groundglass opacities and some effusions in the lower lungs.  Subsequent Covid test was positive.  The patient is having some cough but no significant shortness of breath at this time.  Given the cystitis, need for further monitoring of the hemoglobin in the context of possible GI bleed, and COVID-19, I recommended admission.  The patient agreed.  I consulted Dr. Velia Meyer from the hospitalist service for admission.  _________________________  Richard Zavala was evaluated in Emergency Department on 02/06/2021 for the symptoms described in the history of present illness. He was evaluated in the context of the global COVID-19 pandemic, which necessitated consideration that the patient might be at risk for infection with the SARS-CoV-2 virus that causes COVID-19. Institutional protocols and algorithms that pertain to the evaluation of patients at risk for COVID-19 are in a state of rapid change based on information released by regulatory bodies including the CDC and federal and state organizations. These policies and algorithms were followed during the patient's care in the ED.  ____________________________________________   FINAL CLINICAL IMPRESSION(S) / ED DIAGNOSES  Final diagnoses:  Cystitis  COVID-19  Gastrointestinal hemorrhage, unspecified gastrointestinal hemorrhage type      NEW MEDICATIONS STARTED DURING THIS VISIT:  New Prescriptions   No medications on file     Note:  This document was prepared using Dragon voice recognition software and may include unintentional dictation errors.    Arta Silence, MD 02/06/21 1945

## 2021-02-06 NOTE — ED Notes (Signed)
BLADDER SCAN: 72ml, foley not indicated

## 2021-02-07 DIAGNOSIS — K921 Melena: Secondary | ICD-10-CM

## 2021-02-07 DIAGNOSIS — U071 COVID-19: Secondary | ICD-10-CM | POA: Diagnosis not present

## 2021-02-07 DIAGNOSIS — K922 Gastrointestinal hemorrhage, unspecified: Secondary | ICD-10-CM | POA: Diagnosis not present

## 2021-02-07 DIAGNOSIS — D509 Iron deficiency anemia, unspecified: Secondary | ICD-10-CM | POA: Diagnosis not present

## 2021-02-07 DIAGNOSIS — N390 Urinary tract infection, site not specified: Secondary | ICD-10-CM

## 2021-02-07 DIAGNOSIS — D62 Acute posthemorrhagic anemia: Secondary | ICD-10-CM | POA: Diagnosis not present

## 2021-02-07 LAB — COMPREHENSIVE METABOLIC PANEL
ALT: 18 U/L (ref 0–44)
AST: 35 U/L (ref 15–41)
Albumin: 2.5 g/dL — ABNORMAL LOW (ref 3.5–5.0)
Alkaline Phosphatase: 79 U/L (ref 38–126)
Anion gap: 13 (ref 5–15)
BUN: 43 mg/dL — ABNORMAL HIGH (ref 6–20)
CO2: 21 mmol/L — ABNORMAL LOW (ref 22–32)
Calcium: 7.7 mg/dL — ABNORMAL LOW (ref 8.9–10.3)
Chloride: 101 mmol/L (ref 98–111)
Creatinine, Ser: 6.57 mg/dL — ABNORMAL HIGH (ref 0.61–1.24)
GFR, Estimated: 9 mL/min — ABNORMAL LOW (ref >60)
Glucose, Bld: 173 mg/dL — ABNORMAL HIGH (ref 70–99)
Potassium: 3.6 mmol/L (ref 3.5–5.1)
Sodium: 135 mmol/L (ref 135–145)
Total Bilirubin: 1 mg/dL (ref 0.3–1.2)
Total Protein: 5.9 g/dL — ABNORMAL LOW (ref 6.5–8.1)

## 2021-02-07 LAB — CBC WITH DIFFERENTIAL/PLATELET
Abs Immature Granulocytes: 0.01 10*3/uL (ref 0.00–0.07)
Basophils Absolute: 0 10*3/uL (ref 0.0–0.1)
Basophils Relative: 0 %
Eosinophils Absolute: 0 10*3/uL (ref 0.0–0.5)
Eosinophils Relative: 1 %
HCT: 31.6 % — ABNORMAL LOW (ref 39.0–52.0)
Hemoglobin: 10.1 g/dL — ABNORMAL LOW (ref 13.0–17.0)
Immature Granulocytes: 0 %
Lymphocytes Relative: 16 %
Lymphs Abs: 0.7 10*3/uL (ref 0.7–4.0)
MCH: 29.5 pg (ref 26.0–34.0)
MCHC: 32 g/dL (ref 30.0–36.0)
MCV: 92.4 fL (ref 80.0–100.0)
Monocytes Absolute: 0.7 10*3/uL (ref 0.1–1.0)
Monocytes Relative: 16 %
Neutro Abs: 2.9 10*3/uL (ref 1.7–7.7)
Neutrophils Relative %: 67 %
Platelets: 106 10*3/uL — ABNORMAL LOW (ref 150–400)
RBC: 3.42 MIL/uL — ABNORMAL LOW (ref 4.22–5.81)
RDW: 16.1 % — ABNORMAL HIGH (ref 11.5–15.5)
WBC: 4.3 10*3/uL (ref 4.0–10.5)
nRBC: 0 % (ref 0.0–0.2)

## 2021-02-07 LAB — HEMOGLOBIN AND HEMATOCRIT, BLOOD
HCT: 31.2 % — ABNORMAL LOW (ref 39.0–52.0)
HCT: 31 % — ABNORMAL LOW (ref 39.0–52.0)
Hemoglobin: 10 g/dL — ABNORMAL LOW (ref 13.0–17.0)
Hemoglobin: 10.1 g/dL — ABNORMAL LOW (ref 13.0–17.0)

## 2021-02-07 LAB — PROTIME-INR
INR: 1.3 — ABNORMAL HIGH (ref 0.8–1.2)
Prothrombin Time: 15.6 seconds — ABNORMAL HIGH (ref 11.4–15.2)

## 2021-02-07 LAB — PATHOLOGIST SMEAR REVIEW

## 2021-02-07 LAB — HIV ANTIBODY (ROUTINE TESTING W REFLEX): HIV Screen 4th Generation wRfx: NONREACTIVE

## 2021-02-07 LAB — C-REACTIVE PROTEIN: CRP: 8.4 mg/dL — ABNORMAL HIGH (ref <1.0)

## 2021-02-07 LAB — GLUCOSE, CAPILLARY
Glucose-Capillary: 115 mg/dL — ABNORMAL HIGH (ref 70–99)
Glucose-Capillary: 126 mg/dL — ABNORMAL HIGH (ref 70–99)
Glucose-Capillary: 171 mg/dL — ABNORMAL HIGH (ref 70–99)
Glucose-Capillary: 82 mg/dL (ref 70–99)

## 2021-02-07 LAB — FERRITIN: Ferritin: 1479 ng/mL — ABNORMAL HIGH (ref 24–336)

## 2021-02-07 LAB — LACTATE DEHYDROGENASE: LDH: 275 U/L — ABNORMAL HIGH (ref 98–192)

## 2021-02-07 LAB — PHOSPHORUS: Phosphorus: 5 mg/dL — ABNORMAL HIGH (ref 2.5–4.6)

## 2021-02-07 LAB — FIBRINOGEN: Fibrinogen: 376 mg/dL (ref 210–475)

## 2021-02-07 LAB — MAGNESIUM: Magnesium: 2 mg/dL (ref 1.7–2.4)

## 2021-02-07 NOTE — Clinical Social Work Note (Signed)
Attempted calling patient in room for readmission prevention screen. No answer. Will try again later.  Richard Zavala, [place]

## 2021-02-07 NOTE — Progress Notes (Addendum)
PROGRESS NOTE   HPI was taken from Dr. Velia Meyer: Richard Zavala is a 60 y.o. male with medical history significant for end-stage renal disease on hemodialysis on Monday, Wednesday, Friday schedule, hypertension, hyperlipidemia, type 2 diabetes mellitus, chronic diastolic heart failure, who is admitted to [facility] on 02/06/2021 with suspected acute upper GI bleed after presenting from home to [facility] ED complaining of dark-colored stool.   The patient reports a total of 3-4 episodes of dark-colored stool over the last 2 to 3 days in the absence of any associated hematochezia.  He reports associated suprapubic discomfort over that timeframe, but denies any associated nausea, vomiting, hematemesis.  No preceding trauma or travel.  Denies any associated fever, chills, rigors, or generalized myalgias.  Denies any associated chest pain, palpitations, presyncope, or syncope.   Reports that he is on no blood thinners at home, including no aspirin.  Denies any recent use of NSAIDs, and denies any recent alcohol consumption.  He also denies any known underlying chronic liver pathology.  Denies recent use of any of the following medications: Systemic steroids, supplemental oral potassium, or supplemental p.o. iron.   She reports a very similar presentation in March 2021, for which she reports undergoing EGD at [facility], although he is unsure as to the results of this endoscopic evaluation.  In the interval from March 2021 at present, the patient denies any interval instances of dark-colored stool up until onset 3 days ago.   Additionally, with the patient has end-stage renal disease on hemodialysis, he reports that he continues to produce urine.  However, over the last 2 to 3 days he notes new onset urinary urgency, but with diminished urine output.  The after mentioned suprapubic discomfort is associated with his urinary urgency.  Denies any overt dysuria or gross hematuria.  He notes that  he was recently attended to his scheduled hemodialysis session yesterday, 02/05/21.   He also notes 3 to 4 days of shortness of breath associated with new onset nonproductive cough.  Denies any associated orthopnea, PND, or new onset peripheral edema.  He also denies any associated hemoptysis, calf tenderness, Hower[place] new onset peripheral erythema.  Not associate with any chest pain.  Denies any recent trauma, travel, surgical procedures, or periods of prolonged diminished activity.  Denies any associated headache, neck stiffness, rhinitis, rhinorrhea, sore throat, rash.  No recent known COVID-19 exposures.  Denies any known chronic underlying pulmonary pathology.   Per chart review, it appears that patient's baseline hemoglobin is around 13, with most recent prior hemoglobin noted to be 12.9 when checked in March 2021.   Richard Zavala  UXL:[id number] DOB: 1961-10-06 DOA: 02/06/2021 PCP: Midge Minium, PA   Assessment & Plan:   Principal Problem:   Acute upper GI bleed Active Problems:   End stage renal disease ([place])   Essential hypertension   Type 2 diabetes mellitus with kidney complication, with long-term current use of insulin (HCC)   Acute cystitis   Acute blood loss anemia   COVID-19 virus infection   Acute Upper GI hemorrhage: w/ dark colored stools & guaiac positive stools in ED. Denies NSAID use. Continue on IV protonix. GI consulted   Acute blood loss anemia: likely secondary to above. W/ likely ACD as well. H&H are trending up. Will continue to monitor H&H   COVID19 infection: w/ nonproductive cough otherwise no other symptoms. Will continue on IV remdesivir. Currently there is no indication for steroids. Continue on airborne & contact precautions  UTI: urine cx is pending. Continue on IV rocephin   Thrombocytopenia: etiology unclear, likely secondary to [place]. Will continue to monitor   ESRD: on HD MWF. Nephro consulted   Hyperphosphatemia: management as per  nephro   HLD: continue on statin  DM2: likely poorly controlled. Continue on lantus, SSI w/ accuchecks   Chronic diastolic CHF: appears euvolemic. Monitor I/Os.    DVT prophylaxis: SCDs Code Status: full  Family Communication: Disposition Plan:  Depends on PT/OT recs   Level of care: Med-Surg   Status is: Inpatient  Remains inpatient appropriate because:Ongoing diagnostic testing needed not appropriate for outpatient work up, Unsafe d/c plan and IV treatments appropriate due to intensity of illness or inability to take PO, still monitoring H&H, GI still has to see pt, and awaiting urine cx results   Dispo: The patient is from: Home              Anticipated d/c is to: Home              Anticipated d/c date is: 3 days              Patient currently is not medically stable to d/c.   Difficult to place patient Yes        Consultants:   GI  nephro    Procedures:   Antimicrobials: ceftriaxone    Subjective: Pt c/o malaise   Objective: Vitals:   02/06/21 1753 02/06/21 2101 02/07/21 0432 02/07/21 0453  BP: (!) 144/53 (!) 89/55  (!) 88/58  Pulse: 61 70  64  Resp: 15   18  Temp:  98.6 F (37 C)  98.7 F (37.1 C)  TempSrc:  Oral  Oral  SpO2: 98% 95%  91%  Weight:   72 kg   Height:       No intake or output data in the 24 hours ending 02/07/21 0807 Filed Weights   02/06/21 1212 02/07/21 0432  Weight: 72 kg 72 kg    Examination:  General exam: Appears calm and comfortable. Missing multiple teeth  Respiratory system: decreased breath sounds b/l Cardiovascular system: S1 & S2 +. No rubs, gallops or clicks. N Gastrointestinal system: Abdomen is nondistended, soft and nontender. Normal bowel sounds heard. Central nervous system: Alert and oriented. Moves all 4 extremities Psychiatry: Judgement and insight appear normal. Flat mood and affect     Data Reviewed: I have personally reviewed following labs and imaging studies  CBC: Recent Labs  Lab  02/06/21 1217 02/06/21 1606 02/06/21 2054 02/07/21 0113  WBC 4.9 4.8  --  4.3  NEUTROABS  --   --   --  2.9  HGB 10.4* 9.5* 10.2* 10.1*  HCT 32.0* 29.7* 32.1* 31.6*  MCV 92.5 92.5  --  92.4  PLT 106* 98*  --  174*   Basic Metabolic Panel: Recent Labs  Lab 02/06/21 1217 02/06/21 1253 02/07/21 0113  NA 135  --  135  K 3.6  --  3.6  CL 101  --  101  CO2 22  --  21*  GLUCOSE 98  --  173*  BUN 36*  --  43*  CREATININE 5.89*  --  6.57*  CALCIUM 7.8*  --  7.7*  MG  --  2.0 2.0  PHOS  --   --  5.0*   GFR: Estimated Creatinine Clearance: 12 mL/min (A) (by C-G formula based on SCr of 6.57 mg/dL (H)). Liver Function Tests: Recent Labs  Lab 02/06/21 1248 02/07/21 0113  AST 35 35  ALT 19 18  ALKPHOS 79 79  BILITOT 0.8 1.0  PROT 5.9* 5.9*  ALBUMIN 2.6* 2.5*   No results for input(s): LIPASE, AMYLASE in the last 168 hours. Recent Labs  Lab 02/06/21 1249  AMMONIA 15   Coagulation Profile: Recent Labs  Lab 02/06/21 1920 02/07/21 0113  INR 1.3* 1.3*   Cardiac Enzymes: No results for input(s): CKTOTAL, CKMB, CKMBINDEX, TROPONINI in the last 168 hours. BNP (last 3 results) No results for input(s): PROBNP in the last 8760 hours. HbA1C: No results for input(s): HGBA1C in the last 72 hours. CBG: Recent Labs  Lab 02/07/21 0000 02/07/21 0449  GLUCAP 171* 115*   Lipid Profile: No results for input(s): CHOL, HDL, LDLCALC, TRIG, CHOLHDL, LDLDIRECT in the last 72 hours. Thyroid Function Tests: No results for input(s): TSH, T4TOTAL, FREET4, T3FREE, THYROIDAB in the last 72 hours. Anemia Panel: Recent Labs    02/06/21 1606 02/06/21 1920 02/07/21 0113  FOLATE  --  15.4  --   FERRITIN  --  1,352* 1,479*  TIBC  --  144*  --   IRON  --  26*  --   RETICCTPCT 1.2  --   --    Sepsis Labs: Recent Labs  Lab 02/06/21 1920  PROCALCITON 0.73    Recent Results (from the past 240 hour(s))  Resp Panel by RT-PCR (Flu A&B, Covid) Nasopharyngeal Swab     Status: Abnormal    Collection Time: 02/06/21  3:26 PM   Specimen: Nasopharyngeal Swab; Nasopharyngeal(NP) swabs in vial transport medium  Result Value Ref Range Status   SARS Coronavirus 2 by RT PCR POSITIVE (A) NEGATIVE Final    Comment: RESULT CALLED TO, READ BACK BY AND VERIFIED WITH: ROBIN REGISTER 02/06/21 1623 KLW (NOTE) SARS-CoV-2 target nucleic acids are DETECTED.  The SARS-CoV-2 RNA is generally detectable in upper respiratory specimens during the acute phase of infection. Positive results are indicative of the presence of the identified virus, but do not rule out bacterial infection or co-infection with other pathogens not detected by the test. Clinical correlation with patient history and other diagnostic information is necessary to determine patient infection status. The expected result is Negative.  Fact Sheet for Patients: EntrepreneurPulse.com.au  Fact Sheet for Healthcare Providers: IncredibleEmployment.be  This test is not yet approved or cleared by the [place] FDA and  has been authorized for detection and/or diagnosis of SARS-CoV-2 by FDA under an Emergency Use Authorization (EUA).  This EUA will remain in effect (meaning this test can be u sed) for the duration of  the COVID-19 declaration under Section 564(b)(1) of the Act, 21 U.S.C. section 360bbb-3(b)(1), unless the authorization is terminated or revoked sooner.     Influenza A by PCR NEGATIVE NEGATIVE Final   Influenza B by PCR NEGATIVE NEGATIVE Final    Comment: (NOTE) The Xpert Xpress SARS-CoV-2/FLU/RSV plus assay is intended as an aid in the diagnosis of influenza from Nasopharyngeal swab specimens and should not be used as a sole basis for treatment. Nasal washings and aspirates are unacceptable for Xpert Xpress SARS-CoV-2/FLU/RSV testing.  Fact Sheet for Patients: EntrepreneurPulse.com.au  Fact Sheet for Healthcare  Providers: IncredibleEmployment.be  This test is not yet approved or cleared by the [place] FDA and has been authorized for detection and/or diagnosis of SARS-CoV-2 by FDA under an Emergency Use Authorization (EUA). This EUA will remain in effect (meaning this test can be used) for the duration of the COVID-19 declaration under Section 564(b)(1) of the Act,  21 U.S.C. section 360bbb-3(b)(1), unless the authorization is terminated or revoked.  Performed at [facility], [address] [place], [place] [address]   Culture, blood (Routine X 2) w Reflex to ID Panel     Status: None (Preliminary result)   Collection Time: 02/06/21  7:29 PM   Specimen: BLOOD  Result Value Ref Range Status   Specimen Description BLOOD BLOOD RIGHT HAND  Final   Special Requests   Final    BOTTLES DRAWN AEROBIC AND ANAEROBIC Blood Culture adequate volume   Culture   Final    NO GROWTH < 12 HOURS Performed at [facility], [address] [place], [place] [address]    Report Status PENDING  Incomplete  Culture, blood (Routine X 2) w Reflex to ID Panel     Status: None (Preliminary result)   Collection Time: 02/06/21  7:30 PM   Specimen: BLOOD  Result Value Ref Range Status   Specimen Description BLOOD BLOOD LEFT HAND  Final   Special Requests   Final    BOTTLES DRAWN AEROBIC AND ANAEROBIC Blood Culture adequate volume   Culture   Final    NO GROWTH < 12 HOURS Performed at [facility], [address] [place], [place] [address]    Report Status PENDING  Incomplete         Radiology Studies: CT ABDOMEN PELVIS WO CONTRAST  Result Date: 02/06/2021 CLINICAL DATA:  Flank pain.  Black tarry stools. EXAM: CT ABDOMEN AND PELVIS WITHOUT CONTRAST TECHNIQUE: Multidetector CT imaging of the abdomen and pelvis was performed following the standard protocol without IV contrast. COMPARISON:  CT dated March 07, 2020 FINDINGS: Lower chest: There are  ground-glass airspace opacities at the lung bases bilaterally, right worse than left. There is interlobular septal thickening. There are small bilateral pleural effusions, right greater than left.There is cardiomegaly. The intracardiac blood pool is hypodense relative to the adjacent myocardium consistent with anemia. Hepatobiliary: The liver is normal. Cholelithiasis without acute inflammation.There is no biliary ductal dilation. Pancreas: The pancreas was poorly evaluated secondary to lack of IV contrast and lack of intra-abdominal fat. Spleen: Unremarkable. Adrenals/Urinary Tract: --Adrenal glands: Unremarkable. --Right kidney/ureter: The right kidney is atrophic without evidence for hydronephrosis. --Left kidney/ureter: The patient is status post left-sided nephrectomy. --Urinary bladder: There is mild bladder wall thickening. Stomach/Bowel: --Stomach/Duodenum: No hiatal hernia or other gastric abnormality. Normal duodenal course and caliber. --Small bowel: Unremarkable. --Colon: Unremarkable. --Appendix: Normal. Vascular/Lymphatic: Atherosclerotic calcification is present within the non-aneurysmal abdominal aorta, without hemodynamically significant stenosis. --No retroperitoneal lymphadenopathy. --No mesenteric lymphadenopathy. --No pelvic or inguinal lymphadenopathy. Reproductive: Unremarkable Other: No ascites or free air. There are bilateral fat containing inguinal hernias. Musculoskeletal. There is a severe compression fracture of the T12 vertebral body. This is essentially unchanged from prior studies. There is no new acute compression fracture. There is osteopenia. IMPRESSION: 1. Ground-glass airspace opacities at the lung bases bilaterally, right worse than left, with interlobular septal thickening and small bilateral pleural effusions. Findings are suggestive of developing pulmonary edema. An atypical infectious process is not excluded. 2. Cholelithiasis without acute inflammation. 3. Mild bladder wall  thickening. Recommend correlation with urinalysis to exclude cystitis. 4. Cardiomegaly. 5. Anemia. 6. Status post prior left nephrectomy. 7. Unchanged compression fracture of the T12 vertebral body. Aortic Atherosclerosis (ICD10-I70.0). Electronically Signed   By: Constance Holster M.D.   On: 02/06/2021 15:13   DG Chest Portable 1 View  Result Date: 02/06/2021 CLINICAL DATA:  COVID positive EXAM: PORTABLE CHEST  1 VIEW COMPARISON:  03/07/2020 FINDINGS: Cardiomegaly with vascular congestion. Small bilateral effusions. Diffuse bilateral interstitial and hazy pulmonary opacity, suspect pulmonary edema. No pneumothorax. IMPRESSION: Cardiomegaly with vascular congestion, small bilateral effusions and diffuse bilateral interstitial and hazy pulmonary opacity, suspect for pulmonary edema. Electronically Signed   By: Donavan Foil M.D.   On: 02/06/2021 18:19        Scheduled Meds: . atorvastatin  80 mg Oral QHS  . insulin aspart  0-6 Units Subcutaneous Q6H  . linagliptin  5 mg Oral Daily  . pantoprazole (PROTONIX) IV  40 mg Intravenous Q12H   Continuous Infusions: . cefTRIAXone (ROCEPHIN)  IV    . remdesivir 100 mg in NS 100 mL       LOS: 1 day    Time spent: 33 mins    Wyvonnia Dusky, MD [facility] Pager [phone]  If 7PM-7AM, please contact night-coverage 02/07/2021, 8:07 AM

## 2021-02-07 NOTE — Progress Notes (Signed)
OT Cancellation Note  Patient Details Name: XAVIEN DAUPHINAIS MRN: [id number] DOB: [birth year]   Cancelled Treatment:    Reason Eval/Treat Not Completed: Patient at procedure or test/ unavailable  Pt getting HD in the room at this time. Will f/u for OT evaluation at later date/time as able. Thank you.  Gerrianne Scale, [place], OTR/L ascom [phone] 02/07/21, 6:50 PM

## 2021-02-07 NOTE — Progress Notes (Signed)
PT Cancellation Note  Patient Details Name: Richard Zavala MRN: [id number] DOB: January 10, 1961   Cancelled Treatment:    Reason Eval/Treat Not Completed: Other (comment) PT orders received, chart reviewed. Pt currently in HD & unavailable. Will f/u as able.  Lavone Nian, PT, DPT 02/07/21, 4:16 PM    Waunita Schooner 02/07/2021, 4:16 PM

## 2021-02-07 NOTE — Plan of Care (Signed)
Continuing with plan of care. 

## 2021-02-07 NOTE — Consult Note (Signed)
Vonda Antigua, MD [address] [place], [place], [place], [address] [address], [place], [place], [place], [address] Phone: [phone]  Fax: [phone]  Consultation  Referring Provider:     Dr. Velia Meyer Primary Care Physician:  Midge Minium, [place] Reason for Consultation:     Anemia, dark stools  Date of Admission:  02/06/2021 Date of Consultation:  02/07/2021         HPI:   Richard Zavala is a 60 y.o. male with history of ESRD on hemodialysis Monday Wednesday Friday, presented with dark-colored stool over the last 3 to 4 days.  Denies any hematochezia.  Denies any abdominal pain.  No nausea or vomiting.  No hematemesis.  States last bowel movement was yesterday.  Patient's main concern when I saw him today is that he wants to eat solid food and is very hungry.  Previous records personally reviewed  Patient had a double-balloon enteroscopy at [facility] in November 2021 for iron deficiency anemia and jejunal polyp.  This showed pseudomelanosis duodenitis, 2 jejunal polyps, 15 mm and 6 mm in size that were removed.  Pathology showed hamartomatous polyp, negative for dysplasia, features of peutz zeghers polyp.  Esophagus was normal, stomach was normal.  GI consult in August 2021 at [facility], was done for chronic anemia.  This reports that patient has required intermittent PRBC transfusions and iron infusions with hemodialysis for recurrent anemia with no overt bleeding.  He underwent colonoscopy in 2021 with multiple polyps removed that showed tubular adenoma, hyperplastic and benign inflammatory polyps.  Due to up-to-date colonoscopy the plan was to proceed with small bowel enteroscopy which was done as above   Past Medical History:  Diagnosis Date  . Anemia   . Blind   . BPH (benign prostatic hyperplasia)   . CAD (coronary artery disease)   . CHF (congestive heart failure) ([place])   . Chronic kidney disease (CKD), stage IV (severe) (HCC)    DIALYSIS  . Diabetes mellitus  without complication ([place])   . ESRD (end stage renal disease) ([place])    Monday-Wednesday-Friday dialysis  . Hyperlipemia   . Hypertension   . Neuropathy   . Osteoporosis   . Pulmonary HTN ([place])   . Stroke [facility])    2013  . TIA (transient ischemic attack)   . TRD (traction retinal detachment)   . Wilms' tumor [facility])     Past Surgical History:  Procedure Laterality Date  . A/V FISTULAGRAM Left 01/28/2017   Procedure: A/V Fistulagram;  Surgeon: Algernon Huxley, MD;  Location: [place] CV LAB;  Service: Cardiovascular;  Laterality: Left;  . A/V FISTULAGRAM Left 04/28/2018   Procedure: A/V FISTULAGRAM;  Surgeon: Algernon Huxley, MD;  Location: [place] CV LAB;  Service: Cardiovascular;  Laterality: Left;  . A/V FISTULAGRAM N/A 03/21/2020   Procedure: A/V FISTULAGRAM;  Surgeon: Algernon Huxley, MD;  Location: [place] CV LAB;  Service: Cardiovascular;  Laterality: N/A;  . AV FISTULA PLACEMENT    . AV FISTULA PLACEMENT Right 11/23/2019   Procedure: ARTERIOVENOUS (AV) FISTULA CREATION (RADIOCEPHALIC );  Surgeon: Algernon Huxley, MD;  Location: [facility] ORS;  Service: Vascular;  Laterality: Right;  . CARDIAC CATHETERIZATION    . CATARACT EXTRACTION W/PHACO Right 05/14/2016   Procedure: CATARACT EXTRACTION PHACO AND INTRAOCULAR LENS PLACEMENT (IOC);  Surgeon: Eulogio Bear, MD;  Location: [facility] ORS;  Service: Ophthalmology;  Laterality: Right;  [place] 1.46 AP% 11.5 CDE 12.33 FLUID PACK LOT # [id number]  . EYE  SURGERY    . FRACTURE SURGERY     RIGHT LEG WITH ROD  . HIP SURGERY    . NEPHRECTOMY RADICAL    . PERIPHERAL VASCULAR CATHETERIZATION N/A 08/28/2015   Procedure: A/V Shuntogram/Fistulagram;  Surgeon: Algernon Huxley, MD;  Location: [place] CV LAB;  Service: Cardiovascular;  Laterality: N/A;  . PERIPHERAL VASCULAR CATHETERIZATION Left 08/28/2015   Procedure: A/V Shunt Intervention;  Surgeon: Algernon Huxley, MD;  Location: [place] CV LAB;  Service: Cardiovascular;  Laterality: Left;  . UPPER  EXTREMITY ANGIOGRAPHY Right 08/01/2020   Procedure: UPPER EXTREMITY ANGIOGRAPHY;  Surgeon: Algernon Huxley, MD;  Location: [place] CV LAB;  Service: Cardiovascular;  Laterality: Right;    Prior to Admission medications   Medication Sig Start Date End Date Taking? Authorizing Provider  amLODipine (NORVASC) 10 MG tablet Take 1 tablet (10 mg total) by mouth at bedtime. 08/17/15  Yes Epifanio Lesches, MD  atorvastatin (LIPITOR) 80 MG tablet Take 1 tablet (80 mg total) by mouth daily. Patient taking differently: Take 80 mg by mouth at bedtime. 08/22/16  Yes Demetrios Loll, MD  carvedilol (COREG) 12.5 MG tablet Take 37.5 mg by mouth 2 (two) times daily.   Yes [provider]  furosemide (LASIX) 40 MG tablet Take 40 mg by mouth See admin instructions. Take 40 mg by mouth twice daily on Tuesday, Thursday, Saturday and Sunday.   Yes [provider]  hydrALAZINE (APRESOLINE) 100 MG tablet Take 100 mg by mouth 3 (three) times daily.   Yes [provider]  insulin glargine (LANTUS) 100 UNIT/ML injection Inject 9-10 Units into the skin daily.    Yes [provider]  isosorbide mononitrate (IMDUR) 30 MG 24 hr tablet Take 1 tablet (30 mg total) by mouth daily. Patient taking differently: Take 30 mg by mouth at bedtime. 08/17/15  Yes Epifanio Lesches, MD  pantoprazole (PROTONIX) 40 MG tablet Take 40 mg by mouth 2 (two) times daily before a meal. 01/14/21  Yes [provider]    Family History  Problem Relation Age of Onset  . CAD Father   . COPD Father   . CAD Paternal Grandfather   . Diabetes Maternal Aunt   . Diabetes Maternal Uncle      Social History   Tobacco Use  . Smoking status: Former Smoker    Packs/day: 1.00    Types: Cigarettes    Quit date: 06/26/2012    Years since quitting: 8.6  . Smokeless tobacco: Never Used  Vaping Use  . Vaping Use: Never used  Substance Use Topics  . Alcohol use: No  . Drug use: No    Allergies as of  02/06/2021 - Review Complete 02/06/2021  Allergen Reaction Noted  . Metformin Other (See Comments) 08/14/2015  . Penicillins Hives and Other (See Comments) 08/14/2015    Review of Systems:    All systems reviewed and negative except where noted in HPI.   Physical Exam:  Vital signs in last 24 hours: Vitals:   02/06/21 1753 02/06/21 2101 02/07/21 0432 02/07/21 0453  BP: (!) 144/53 (!) 89/55  (!) 88/58  Pulse: 61 70  64  Resp: 15   18  Temp:  98.6 F (37 C)  98.7 F (37.1 C)  TempSrc:  Oral  Oral  SpO2: 98% 95%  91%  Weight:   72 kg   Height:         General:   Pleasant, cooperative in NAD Head:  Normocephalic and atraumatic. Eyes:  No icterus.   Conjunctiva pink. PERRLA. Ears:  Normal auditory acuity. Neck:  Supple; no masses or thyroidomegaly Lungs: Respirations even and unlabored. Lungs clear to auscultation bilaterally.   No wheezes, crackles, or rhonchi.  Abdomen:  Soft, nondistended, nontender. Normal bowel sounds. No appreciable masses or hepatomegaly.  No rebound or guarding.  Neurologic:  Alert and oriented x3;  grossly normal neurologically. Skin:  Intact without significant lesions or rashes. Cervical Nodes:  No significant cervical adenopathy. Psych:  Alert and cooperative. Normal affect.  LAB RESULTS: Recent Labs    02/06/21 1217 02/06/21 1606 02/06/21 2054 02/07/21 0113 02/07/21 0849  WBC 4.9 4.8  --  4.3  --   HGB 10.4* 9.5* 10.2* 10.1* 10.0*  HCT 32.0* 29.7* 32.1* 31.6* 31.2*  PLT 106* 98*  --  106*  --    BMET Recent Labs    02/06/21 1217 02/07/21 0113  NA 135 135  K 3.6 3.6  CL 101 101  CO2 22 21*  GLUCOSE 98 173*  BUN 36* 43*  CREATININE 5.89* 6.57*  CALCIUM 7.8* 7.7*   LFT Recent Labs    02/06/21 1248 02/07/21 0113  PROT 5.9* 5.9*  ALBUMIN 2.6* 2.5*  AST 35 35  ALT 19 18  ALKPHOS 79 79  BILITOT 0.8 1.0  BILIDIR 0.1  --   IBILI 0.7  --    PT/INR Recent Labs    02/06/21 1920 02/07/21 0113  LABPROT 15.5* 15.6*  INR  1.3* 1.3*    STUDIES: CT ABDOMEN PELVIS WO CONTRAST  Result Date: 02/06/2021 CLINICAL DATA:  Flank pain.  Black tarry stools. EXAM: CT ABDOMEN AND PELVIS WITHOUT CONTRAST TECHNIQUE: Multidetector CT imaging of the abdomen and pelvis was performed following the standard protocol without IV contrast. COMPARISON:  CT dated March 07, 2020 FINDINGS: Lower chest: There are ground-glass airspace opacities at the lung bases bilaterally, right worse than left. There is interlobular septal thickening. There are small bilateral pleural effusions, right greater than left.There is cardiomegaly. The intracardiac blood pool is hypodense relative to the adjacent myocardium consistent with anemia. Hepatobiliary: The liver is normal. Cholelithiasis without acute inflammation.There is no biliary ductal dilation. Pancreas: The pancreas was poorly evaluated secondary to lack of IV contrast and lack of intra-abdominal fat. Spleen: Unremarkable. Adrenals/Urinary Tract: --Adrenal glands: Unremarkable. --Right kidney/ureter: The right kidney is atrophic without evidence for hydronephrosis. --Left kidney/ureter: The patient is status post left-sided nephrectomy. --Urinary bladder: There is mild bladder wall thickening. Stomach/Bowel: --Stomach/Duodenum: No hiatal hernia or other gastric abnormality. Normal duodenal course and caliber. --Small bowel: Unremarkable. --Colon: Unremarkable. --Appendix: Normal. Vascular/Lymphatic: Atherosclerotic calcification is present within the non-aneurysmal abdominal aorta, without hemodynamically significant stenosis. --No retroperitoneal lymphadenopathy. --No mesenteric lymphadenopathy. --No pelvic or inguinal lymphadenopathy. Reproductive: Unremarkable Other: No ascites or free air. There are bilateral fat containing inguinal hernias. Musculoskeletal. There is a severe compression fracture of the T12 vertebral body. This is essentially unchanged from prior studies. There is no new acute compression  fracture. There is osteopenia. IMPRESSION: 1. Ground-glass airspace opacities at the lung bases bilaterally, right worse than left, with interlobular septal thickening and small bilateral pleural effusions. Findings are suggestive of developing pulmonary edema. An atypical infectious process is not excluded. 2. Cholelithiasis without acute inflammation. 3. Mild bladder wall thickening. Recommend correlation with urinalysis to exclude cystitis. 4. Cardiomegaly. 5. Anemia. 6. Status post prior left nephrectomy. 7. Unchanged compression fracture of the T12 vertebral body. Aortic Atherosclerosis (ICD10-I70.0). Electronically Signed   By: Jamie Kato.D.  On: 02/06/2021 15:13   DG Chest Portable 1 View  Result Date: 02/06/2021 CLINICAL DATA:  COVID positive EXAM: PORTABLE CHEST 1 VIEW COMPARISON:  03/07/2020 FINDINGS: Cardiomegaly with vascular congestion. Small bilateral effusions. Diffuse bilateral interstitial and hazy pulmonary opacity, suspect pulmonary edema. No pneumothorax. IMPRESSION: Cardiomegaly with vascular congestion, small bilateral effusions and diffuse bilateral interstitial and hazy pulmonary opacity, suspect for pulmonary edema. Electronically Signed   By: Donavan Foil M.D.   On: 02/06/2021 18:19      Impression / Plan:   Richard Zavala is a 60 y.o. y/o male with history of ESRD on hemodialysis, chronic anemia, presents with complaint of dark-colored stool with no further bowel movement since presentation  Patient's baseline hemoglobin is around 12, from November 2021 in care everywhere  On admission hemoglobin was 9.5, improved to 10 without any need for transfusion  Patient was found to be Covid positive with imaging significant for developing pulmonary edema, groundglass airspace opacities at the lung bases bilaterally  Given patient's acute Covid infection, and acute findings on chest imaging, with no further bowel movement since hospital admission, stable hemoglobin  since admission, with no signs of active GI bleeding since admission, endoscopy in the setting would be higher risks than benefits  Especially given that patient has already had endoscopic visualization very recently November 2021 with a small bowel enteroscopy, and colonoscopy in 2021 as well, risk of underlying malignancy is low  However, if patient has recurrent bleeding, or worsening anemia, endoscopy can be considered  Patient does have known chronic anemia as well  PPI IV twice daily  Continue serial CBCs and transfuse PRN Avoid NSAIDs Maintain 2 large-bore IV lines Please page GI with any acute hemodynamic changes, or signs of active GI bleeding  If the dark stool he had at home is due to possibly underlying small gastric ulcers, gastritis, esophagitis, this can usually be managed with PPI  However, if anemia worsens or recurrent bleeding occurs, benefits of endoscopic procedures versus risk may change and patient will need to be reevaluated accordingly for the same  Dr. Alice Reichert to follow over the weekend  Thank you for involving me in the care of this patient.      LOS: 1 day   Virgel Manifold, MD  02/07/2021, 12:06 PM

## 2021-02-07 NOTE — Progress Notes (Signed)
[facility]  ROUNDING NOTE   Subjective:   Mr. XZAVIER SWINGER was admitted to [facility] on 02/06/2021 for Acute cystitis [N30.00] Cystitis [N30.90] Gastrointestinal hemorrhage, unspecified gastrointestinal hemorrhage type [K92.2] COVID-19 [U07.1]  Last hemodialysis treatment was 2/16. Got to 72kg.   Patient presents with GI bleed. Found to have COVID-19 infection.    Objective:  Vital signs in last 24 hours:  Temp:  [98.2 F (36.8 C)-98.7 F (37.1 C)] 98.7 F (37.1 C) (02/18 0453) Pulse Rate:  [58-70] 64 (02/18 0453) Resp:  [15-19] 18 (02/18 0453) BP: (88-144)/(53-94) 88/58 (02/18 0453) SpO2:  [91 %-98 %] 91 % (02/18 0453) Weight:  [72 kg] 72 kg (02/18 0432)  Weight change:  Filed Weights   02/06/21 1212 02/07/21 0432  Weight: 72 kg 72 kg    Intake/Output: No intake/output data recorded.   Intake/Output this shift:  No intake/output data recorded.  Physical Exam: General: NAD,   Head: Normocephalic, atraumatic. Moist oral mucosal membranes  Eyes: Anicteric, PERRL  Neck: Supple, trachea midline  Lungs:  Clear to auscultation  Heart: Regular rate and rhythm  Abdomen:  Soft, nontender,   Extremities:  no peripheral edema.  Neurologic: Nonfocal, moving all four extremities  Skin: No lesions  Access: Left AVF    Basic Metabolic Panel: Recent Labs  Lab 02/06/21 1217 02/06/21 1253 02/07/21 0113  NA 135  --  135  K 3.6  --  3.6  CL 101  --  101  CO2 22  --  21*  GLUCOSE 98  --  173*  BUN 36*  --  43*  CREATININE 5.89*  --  6.57*  CALCIUM 7.8*  --  7.7*  MG  --  2.0 2.0  PHOS  --   --  5.0*    Liver Function Tests: Recent Labs  Lab 02/06/21 1248 02/07/21 0113  AST 35 35  ALT 19 18  ALKPHOS 79 79  BILITOT 0.8 1.0  PROT 5.9* 5.9*  ALBUMIN 2.6* 2.5*   No results for input(s): LIPASE, AMYLASE in the last 168 hours. Recent Labs  Lab 02/06/21 1249  AMMONIA 15    CBC: Recent Labs  Lab 02/06/21 1217 02/06/21 1606 02/06/21 2054  02/07/21 0113 02/07/21 0849  WBC 4.9 4.8  --  4.3  --   NEUTROABS  --   --   --  2.9  --   HGB 10.4* 9.5* 10.2* 10.1* 10.0*  HCT 32.0* 29.7* 32.1* 31.6* 31.2*  MCV 92.5 92.5  --  92.4  --   PLT 106* 98*  --  106*  --     Cardiac Enzymes: No results for input(s): CKTOTAL, CKMB, CKMBINDEX, TROPONINI in the last 168 hours.  BNP: Invalid input(s): POCBNP  CBG: Recent Labs  Lab 02/07/21 0000 02/07/21 0449  GLUCAP 171* 115*    Microbiology: Results for orders placed or performed during the hospital encounter of 02/06/21  Resp Panel by RT-PCR (Flu A&B, Covid) Nasopharyngeal Swab     Status: Abnormal   Collection Time: 02/06/21  3:26 PM   Specimen: Nasopharyngeal Swab; Nasopharyngeal(NP) swabs in vial transport medium  Result Value Ref Range Status   SARS Coronavirus 2 by RT PCR POSITIVE (A) NEGATIVE Final    Comment: RESULT CALLED TO, READ BACK BY AND VERIFIED WITH: ROBIN REGISTER 02/06/21 1623 KLW (NOTE) SARS-CoV-2 target nucleic acids are DETECTED.  The SARS-CoV-2 RNA is generally detectable in upper respiratory specimens during the acute phase of infection. Positive results are indicative of the  presence of the identified virus, but do not rule out bacterial infection or co-infection with other pathogens not detected by the test. Clinical correlation with patient history and other diagnostic information is necessary to determine patient infection status. The expected result is Negative.  Fact Sheet for Patients: EntrepreneurPulse.com.au  Fact Sheet for Healthcare Providers: IncredibleEmployment.be  This test is not yet approved or cleared by the [place] FDA and  has been authorized for detection and/or diagnosis of SARS-CoV-2 by FDA under an Emergency Use Authorization (EUA).  This EUA will remain in effect (meaning this test can be u sed) for the duration of  the COVID-19 declaration under Section 564(b)(1) of the Act,  21 U.S.C. section 360bbb-3(b)(1), unless the authorization is terminated or revoked sooner.     Influenza A by PCR NEGATIVE NEGATIVE Final   Influenza B by PCR NEGATIVE NEGATIVE Final    Comment: (NOTE) The Xpert Xpress SARS-CoV-2/FLU/RSV plus assay is intended as an aid in the diagnosis of influenza from Nasopharyngeal swab specimens and should not be used as a sole basis for treatment. Nasal washings and aspirates are unacceptable for Xpert Xpress SARS-CoV-2/FLU/RSV testing.  Fact Sheet for Patients: EntrepreneurPulse.com.au  Fact Sheet for Healthcare Providers: IncredibleEmployment.be  This test is not yet approved or cleared by the [place] FDA and has been authorized for detection and/or diagnosis of SARS-CoV-2 by FDA under an Emergency Use Authorization (EUA). This EUA will remain in effect (meaning this test can be used) for the duration of the COVID-19 declaration under Section 564(b)(1) of the Act, 21 U.S.C. section 360bbb-3(b)(1), unless the authorization is terminated or revoked.  Performed at [facility], [address] [place], [place] [address]   Culture, blood (Routine X 2) w Reflex to ID Panel     Status: None (Preliminary result)   Collection Time: 02/06/21  7:29 PM   Specimen: BLOOD  Result Value Ref Range Status   Specimen Description BLOOD BLOOD RIGHT HAND  Final   Special Requests   Final    BOTTLES DRAWN AEROBIC AND ANAEROBIC Blood Culture adequate volume   Culture   Final    NO GROWTH < 12 HOURS Performed at [facility], [address] [place], [place] [address]    Report Status PENDING  Incomplete  Culture, blood (Routine X 2) w Reflex to ID Panel     Status: None (Preliminary result)   Collection Time: 02/06/21  7:30 PM   Specimen: BLOOD  Result Value Ref Range Status   Specimen Description BLOOD BLOOD LEFT HAND  Final   Special Requests   Final    BOTTLES DRAWN AEROBIC AND  ANAEROBIC Blood Culture adequate volume   Culture   Final    NO GROWTH < 12 HOURS Performed at [facility], [address] [place], [place] [address]    Report Status PENDING  Incomplete    Coagulation Studies: Recent Labs    02/06/21 1920 02/07/21 0113  LABPROT 15.5* 15.6*  INR 1.3* 1.3*    Urinalysis: No results for input(s): COLORURINE, LABSPEC, PHURINE, GLUCOSEU, HGBUR, BILIRUBINUR, KETONESUR, PROTEINUR, UROBILINOGEN, NITRITE, LEUKOCYTESUR in the last 72 hours.  Invalid input(s): APPERANCEUR    Imaging: CT ABDOMEN PELVIS WO CONTRAST  Result Date: 02/06/2021 CLINICAL DATA:  Flank pain.  Black tarry stools. EXAM: CT ABDOMEN AND PELVIS WITHOUT CONTRAST TECHNIQUE: Multidetector CT imaging of the abdomen and pelvis was performed following the standard protocol without IV contrast. COMPARISON:  CT dated March 07, 2020 FINDINGS: Lower chest:  There are ground-glass airspace opacities at the lung bases bilaterally, right worse than left. There is interlobular septal thickening. There are small bilateral pleural effusions, right greater than left.There is cardiomegaly. The intracardiac blood pool is hypodense relative to the adjacent myocardium consistent with anemia. Hepatobiliary: The liver is normal. Cholelithiasis without acute inflammation.There is no biliary ductal dilation. Pancreas: The pancreas was poorly evaluated secondary to lack of IV contrast and lack of intra-abdominal fat. Spleen: Unremarkable. Adrenals/Urinary Tract: --Adrenal glands: Unremarkable. --Right kidney/ureter: The right kidney is atrophic without evidence for hydronephrosis. --Left kidney/ureter: The patient is status post left-sided nephrectomy. --Urinary bladder: There is mild bladder wall thickening. Stomach/Bowel: --Stomach/Duodenum: No hiatal hernia or other gastric abnormality. Normal duodenal course and caliber. --Small bowel: Unremarkable. --Colon: Unremarkable. --Appendix: Normal.  Vascular/Lymphatic: Atherosclerotic calcification is present within the non-aneurysmal abdominal aorta, without hemodynamically significant stenosis. --No retroperitoneal lymphadenopathy. --No mesenteric lymphadenopathy. --No pelvic or inguinal lymphadenopathy. Reproductive: Unremarkable Other: No ascites or free air. There are bilateral fat containing inguinal hernias. Musculoskeletal. There is a severe compression fracture of the T12 vertebral body. This is essentially unchanged from prior studies. There is no new acute compression fracture. There is osteopenia. IMPRESSION: 1. Ground-glass airspace opacities at the lung bases bilaterally, right worse than left, with interlobular septal thickening and small bilateral pleural effusions. Findings are suggestive of developing pulmonary edema. An atypical infectious process is not excluded. 2. Cholelithiasis without acute inflammation. 3. Mild bladder wall thickening. Recommend correlation with urinalysis to exclude cystitis. 4. Cardiomegaly. 5. Anemia. 6. Status post prior left nephrectomy. 7. Unchanged compression fracture of the T12 vertebral body. Aortic Atherosclerosis (ICD10-I70.0). Electronically Signed   By: Constance Holster M.D.   On: 02/06/2021 15:13   DG Chest Portable 1 View  Result Date: 02/06/2021 CLINICAL DATA:  COVID positive EXAM: PORTABLE CHEST 1 VIEW COMPARISON:  03/07/2020 FINDINGS: Cardiomegaly with vascular congestion. Small bilateral effusions. Diffuse bilateral interstitial and hazy pulmonary opacity, suspect pulmonary edema. No pneumothorax. IMPRESSION: Cardiomegaly with vascular congestion, small bilateral effusions and diffuse bilateral interstitial and hazy pulmonary opacity, suspect for pulmonary edema. Electronically Signed   By: Donavan Foil M.D.   On: 02/06/2021 18:19     Medications:   . cefTRIAXone (ROCEPHIN)  IV    . remdesivir 100 mg in NS 100 mL     . atorvastatin  80 mg Oral QHS  . insulin aspart  0-6 Units  Subcutaneous Q6H  . linagliptin  5 mg Oral Daily  . pantoprazole (PROTONIX) IV  40 mg Intravenous Q12H   acetaminophen **OR** acetaminophen, albuterol, benzonatate, fentaNYL (SUBLIMAZE) injection  Assessment/ Plan:  Mr. KENAN MOODIE is a 60 y.o. white male with end stage renal disease on hemodialysis, CVA, hypertension, pulmonary hypertension, diabetes mellitus type II, diabetic neuropathy, diabetic retinopathy, BPH, coronary artery disease, hyperlipidemia who is admitted to [facility] on 02/06/2021 for Acute cystitis [N30.00] Cystitis [N30.90] Gastrointestinal hemorrhage, unspecified gastrointestinal hemorrhage type [K92.2] COVID-19 [U07.1]  [facility] Left AVF 72kg  1. End Stage Renal Disease: last hemodialysis was 2/16.  - plan on TTS schedule due to COVID-19 infection. Dialysis for tomorrow.   2. Hypotension: history of hypertension with home regimen of carvedilol, amlodipine, hydralazine, furosemide and isosorbide mononitrate.  - Holding all blood pressure agents.   3. Anemia with chronic kidney disease: hemoglobin 10. EPO as outpatient.   4. Secondary Hyperparathyroidism: with hyperphosphatemia and hypocalcemia. Not currently on binders.    LOS: 1 Drury Ardizzone 2/18/202211:32 AM

## 2021-02-08 DIAGNOSIS — K922 Gastrointestinal hemorrhage, unspecified: Secondary | ICD-10-CM | POA: Diagnosis not present

## 2021-02-08 LAB — BASIC METABOLIC PANEL
Anion gap: 13 (ref 5–15)
BUN: 32 mg/dL — ABNORMAL HIGH (ref 6–20)
CO2: 25 mmol/L (ref 22–32)
Calcium: 7.8 mg/dL — ABNORMAL LOW (ref 8.9–10.3)
Chloride: 98 mmol/L (ref 98–111)
Creatinine, Ser: 5.48 mg/dL — ABNORMAL HIGH (ref 0.61–1.24)
GFR, Estimated: 11 mL/min — ABNORMAL LOW (ref >60)
Glucose, Bld: 96 mg/dL (ref 70–99)
Potassium: 3.7 mmol/L (ref 3.5–5.1)
Sodium: 136 mmol/L (ref 135–145)

## 2021-02-08 LAB — CBC
HCT: 32.8 % — ABNORMAL LOW (ref 39.0–52.0)
Hemoglobin: 10.7 g/dL — ABNORMAL LOW (ref 13.0–17.0)
MCH: 29.6 pg (ref 26.0–34.0)
MCHC: 32.6 g/dL (ref 30.0–36.0)
MCV: 90.6 fL (ref 80.0–100.0)
Platelets: 116 10*3/uL — ABNORMAL LOW (ref 150–400)
RBC: 3.62 MIL/uL — ABNORMAL LOW (ref 4.22–5.81)
RDW: 16.1 % — ABNORMAL HIGH (ref 11.5–15.5)
WBC: 4.3 10*3/uL (ref 4.0–10.5)
nRBC: 0 % (ref 0.0–0.2)

## 2021-02-08 LAB — MAGNESIUM: Magnesium: 2 mg/dL (ref 1.7–2.4)

## 2021-02-08 LAB — GLUCOSE, CAPILLARY
Glucose-Capillary: 115 mg/dL — ABNORMAL HIGH (ref 70–99)
Glucose-Capillary: 113 mg/dL — ABNORMAL HIGH (ref 70–99)
Glucose-Capillary: 106 mg/dL — ABNORMAL HIGH (ref 70–99)
Glucose-Capillary: 87 mg/dL (ref 70–99)

## 2021-02-08 LAB — PHOSPHORUS: Phosphorus: 4.5 mg/dL (ref 2.5–4.6)

## 2021-02-08 LAB — HEPATITIS B SURFACE ANTIGEN: Hepatitis B Surface Ag: NONREACTIVE

## 2021-02-08 MED ORDER — ATORVASTATIN CALCIUM 80 MG PO TABS: 80.0000 mg | ORAL_TABLET | Freq: Every day | ORAL | Status: AC

## 2021-02-08 MED ORDER — CALCIUM CARBONATE ANTACID 500 MG PO CHEW
1.0000 | CHEWABLE_TABLET | Freq: Three times a day (TID) | ORAL | Status: DC | PRN
  Filled 2021-02-08: qty 1

## 2021-02-08 MED ORDER — MIDODRINE HCL 5 MG PO TABS
10.0000 mg | ORAL_TABLET | Freq: Once | ORAL | Status: AC
  Administered 2021-02-08: 10 mg via ORAL
  Filled 2021-02-08: qty 2

## 2021-02-08 MED ORDER — SODIUM CHLORIDE 0.9 % IV SOLN
INTRAVENOUS | Status: DC | PRN
  Administered 2021-02-08: 250 mL via INTRAVENOUS

## 2021-02-08 MED ORDER — HYDRALAZINE HCL 100 MG PO TABS: ORAL_TABLET | ORAL | Status: DC

## 2021-02-08 MED ORDER — AMLODIPINE BESYLATE 10 MG PO TABS: ORAL_TABLET | ORAL | 0 refills | Status: DC

## 2021-02-08 MED ORDER — SODIUM CHLORIDE 0.9 % IV SOLN
1.0000 g | INTRAVENOUS | Status: DC
  Administered 2021-02-08: 1 g via INTRAVENOUS
  Filled 2021-02-08: qty 1
  Filled 2021-02-08: qty 10

## 2021-02-08 MED ORDER — ALUM & MAG HYDROXIDE-SIMETH 200-200-20 MG/5ML PO SUSP
15.0000 mL | Freq: Four times a day (QID) | ORAL | Status: DC | PRN
  Administered 2021-02-09: 15 mL via ORAL
  Filled 2021-02-08: qty 30

## 2021-02-08 MED ORDER — ISOSORBIDE MONONITRATE ER 30 MG PO TB24: ORAL_TABLET | ORAL | 0 refills | Status: DC

## 2021-02-08 MED ORDER — FUROSEMIDE 40 MG PO TABS: ORAL_TABLET | ORAL | Status: DC

## 2021-02-08 MED ORDER — LOPERAMIDE HCL 2 MG PO CAPS
2.0000 mg | ORAL_CAPSULE | Freq: Once | ORAL | Status: AC
  Administered 2021-02-08: 2 mg via ORAL
  Filled 2021-02-08: qty 1

## 2021-02-08 NOTE — Evaluation (Signed)
Physical Therapy Evaluation Patient Details Name: Richard Zavala MRN: [id number] DOB: Feb 01, 1961 Today's Date: 02/08/2021   History of Present Illness  Pt is a 60 y/o M admitted on 02/06/21 with suspected upper GI bleed after presenting to ED c/o dark colored stool. Pt also with c/o SOB & new nonproductive cough x 3-4 days. Pt found to be COVID (+) & have acute upper GI bleed. PMH: ESRD on HD MWF, HTN, HLD, DM2, chronic diastolic heart failure, anemia, blind, BPH, CHF, DM, neuropathy, osteoporosis, pulmonary HTN, stroke (2013), TIA, traction retinal detachment, Wilms tumor  Clinical Impression  Pt seen for PT evaluation with co-tx for OT. Pt slightly irritable during session & responds better when asked questions indirectly. Pt is able to ambulate in room with RW & supervision with gait patter noted below. PT manages IV pole throughout session. Pt deconditioned but possibly not far from baseline. Will continue to see pt acutely to progress independence with gait with LRAD & for balance training to decrease fall risk prior to return home.     Follow Up Recommendations Home health PT;Supervision for mobility/OOB    Equipment Recommendations  None recommended by PT (pt already has RW)    Recommendations for Other Services       Precautions / Restrictions Precautions Precautions: Fall Restrictions Weight Bearing Restrictions: No      Mobility  Bed Mobility Overal bed mobility: Modified Independent             General bed mobility comments: increased time, use of rails, HOB elevated    Transfers Overall transfer level: Modified independent Equipment used: Rolling walker (2 wheeled)             General transfer comment: increased time, somewhat unsteady, endorses being unsteady at baseline since a broken leg in 2013  Ambulation/Gait Ambulation/Gait assistance: Supervision Gait Distance (Feet): 22 Feet Assistive device: Rolling walker (2 wheeled) Gait Pattern/deviations:  Decreased dorsiflexion - right;Decreased stride length;Decreased dorsiflexion - left;Narrow base of support;Decreased step length - right;Decreased step length - left Gait velocity: decreased      Stairs            Wheelchair Mobility    Modified Rankin (Stroke Patients Only)       Balance Overall balance assessment: Mild deficits observed, not formally tested Sitting-balance support: Feet supported Sitting balance-Leahy Scale: Good Sitting balance - Comments: sitting EOB without assist/LOB     Standing balance-Leahy Scale: Fair Standing balance comment: BUE support on RW during gait                             Pertinent Vitals/Pain Pain Assessment: Faces Faces Pain Scale: Hurts even more Pain Location: back-chronic, and belly ("have the runs") Pain Descriptors / Indicators: Grimacing;Moaning Pain Intervention(s): Limited activity within patient's tolerance;Monitored during session    [place] expects to be discharged to:: Private residence Living Arrangements: Children (adult son) Available Help at Discharge: Family;Available PRN/intermittently Type of Home: House Home Access: Stairs to enter   Entrance Stairs-Number of Steps: 1 Home Layout: One level Home Equipment: Walker - 2 wheels;Cane - single point;Shower seat      Prior Function Level of Independence: Needs assistance   Gait / Transfers Assistance Needed: RW/cane occasionally for fxl mobility, but reports he primarily uses no AD.  ADL's / Homemaking Assistance Needed: INDEP/MOD I with basic ADLs like bathing with shower seat. Son helps with IADLs such as cooking/cleaning and driving as  pt with low vision at baseline        Hand Dominance   Dominant Hand: Right    Extremity/Trunk Assessment   Upper Extremity Assessment Upper Extremity Assessment: RUE deficits/detail;LUE deficits/detail;Generalized weakness RUE Deficits / Details: significant chronic dislocation  noted to R wrist, but pt with good flex/ext; limited rotation. Grip MMT grossly 4-/5 LUE Deficits / Details: ROM WFL, 2 large fistulas noted to forearm. MMT grossly 4/5 throughout    Lower Extremity Assessment Lower Extremity Assessment: Generalized weakness (ROM WFL)       Communication   Communication: No difficulties  Cognition Arousal/Alertness: Awake/alert Behavior During Therapy: WFL for tasks assessed/performed (irritable) Overall Cognitive Status: Within Functional Limits for tasks assessed                                 General Comments: pt somewhat disgruntled/apprehensive about participation with therapy and generally appears unhappy, but is overall cooperative and demos ability to appropriately follow cues/commands.      General Comments General comments (skin integrity, edema, etc.): Pt with poor insight, attempted to educate pt on need for nursing assistance for ambulation but pt with poor understanding of learning    Exercises    Assessment/Plan    PT Assessment Patient needs continued PT services  PT Problem List Decreased strength;Decreased balance;Pain;Decreased mobility;Decreased knowledge of use of DME;Decreased safety awareness;Decreased activity tolerance       PT Treatment Interventions DME instruction;Functional mobility training;Balance training;Patient/family education;Gait training;Therapeutic activities;Neuromuscular re-education;Stair training;Therapeutic exercise    PT Goals (Current goals can be found in the Care Plan section)  Acute Rehab PT Goals Patient Stated Goal: to go home PT Goal Formulation: With patient Time For Goal Achievement: 02/22/21 Potential to Achieve Goals: Good    Frequency Min 2X/week   Barriers to discharge Decreased caregiver support      Co-evaluation PT/OT/SLP Co-Evaluation/Treatment: Yes Reason for Co-Treatment: Necessary to address cognition/behavior during functional activity PT goals addressed  during session: Mobility/safety with mobility;Balance;Proper use of DME OT goals addressed during session: ADL's and self-care       AM-PAC PT "6 Clicks" Mobility  Outcome Measure Help needed turning from your back to your side while in a flat bed without using bedrails?: None Help needed moving from lying on your back to sitting on the side of a flat bed without using bedrails?: None Help needed moving to and from a bed to a chair (including a wheelchair)?: A Little Help needed standing up from a chair using your arms (e.g., wheelchair or bedside chair)?: None Help needed to walk in hospital room?: A Little Help needed climbing 3-5 steps with a railing? : A Little 6 Click Score: 21    End of Session   Activity Tolerance: Patient tolerated treatment well;Patient limited by pain Patient left:  (sitting EOB with bed alarm set) Nurse Communication: Mobility status PT Visit Diagnosis: Unsteadiness on feet (R26.81);Muscle weakness (generalized) (M62.81);Difficulty in walking, not elsewhere classified (R26.2)    Time: 7622-6333 PT Time Calculation (min) (ACUTE ONLY): 10 min   Charges:   PT Evaluation $PT Eval Low Complexity: Angels, PT, DPT 02/08/21, 3:01 PM   Waunita Schooner 02/08/2021, 2:59 PM

## 2021-02-08 NOTE — Progress Notes (Signed)
[facility] GI Brief inpatient progress note  Patient having some diarrhea, unquantified. No reported bleeding.  Vitals reviewed and are stable.  As mentioned previously, Extensive GI workup performed at [facility] in 2021 including DBE was unremarkable for active bleeding or malignancy. 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  Melena appears to have resolved for now. Given COVID + status, will avoid elective procedures until after resolution of infection.  Following.  Robet Leu, M.D. ABIM Diplomate in Gastroenterology [place]

## 2021-02-08 NOTE — Evaluation (Signed)
Occupational Therapy Evaluation Patient Details Name: Richard Zavala MRN: [id number] DOB: September 07, 1961 Today's Date: 02/08/2021    History of Present Illness Pt is a 60 y/o M admitted on 02/06/21 with suspected upper GI bleed after presenting to ED c/o dark colored stool. Pt also with c/o SOB & new nonproductive cough x 3-4 days. Pt found to be COVID (+) & have acute upper GI bleed. PMH: ESRD on HD MWF, HTN, HLD, DM2, chronic diastolic heart failure, anemia, blind, BPH, CHF, DM, neuropathy, osteoporosis, pulmonary HTN, stroke (2013), TIA, traction retinal detachment, Wilms tumor   Clinical Impression   Pt seen for OT evaluation in setting of acute hospitalization with COVID. Pt reports living with his son at baseline in [facility] with 1 STE. Pt reports being MOD I for some ADLs such as bathing with shower seat and reports intermittent use of RW or cane, but primarily performs fxl mobility w/o AD. Pt states that his son performs most IADLs including running errands and cleaning. Pt with low vision at baseline and is unable to drive. Pt is somewhat reluctant to participate and somewhat unpleasant throughout session, but is ultimately cooperative and demos good understanding of cues and education. While pt does struggle to perform his LB ADLs d/t chronic back pain, he is able to complete the tasks at MOD I (ex: cross-leg technique for seated threading of socks/shoes). Pt performs fxl mobility with MOD I with RW. While he can stand w/o RW, he definitely benefits from UE support and his fxl mobility appears much safer. Pt declines to get back in bed and declines to get into chair at end of session. He is left sitting EOB with bed alarm set and tray table and call button near. While pt does have decreased balance and struggles with ADLs, he makes it pretty apparent that he does not want continued therapy services and feels this is his baseline and "good enough". Will complete OT order at this time and do not currently  anticipate need for f/u services outside of hospital stay.     Follow Up Recommendations  No OT follow up    Equipment Recommendations  3 in 1 bedside commode    Recommendations for Other Services       Precautions / Restrictions Precautions Precautions: Fall Restrictions Weight Bearing Restrictions: No      Mobility Bed Mobility Overal bed mobility: Modified Independent             General bed mobility comments: increased time, use of rails, HOB elevated    Transfers Overall transfer level: Modified independent Equipment used: Rolling walker (2 wheeled)             General transfer comment: increased time, somewhat unsteady, endorses being unsteady at baseline since a broken leg in 2013    Balance Overall balance assessment: Mild deficits observed, not formally tested Sitting-balance support: Feet supported Sitting balance-Leahy Scale: Good Sitting balance - Comments: sitting EOB without assist/LOB     Standing balance-Leahy Scale: Fair Standing balance comment: BUE support on RW during gait                           ADL either performed or assessed with clinical judgement   ADL Overall ADL's : Modified independent  General ADL Comments: requires increased time and SETUP, but ultimately able to perform ADLs with no assist. Benefits from UE support on RW.     Vision Patient Visual Report: No change from baseline       Perception     Praxis      Pertinent Vitals/Pain Pain Assessment: Faces Faces Pain Scale: Hurts even more Pain Location: back-chronic, and belly ("have the runs") Pain Descriptors / Indicators: Grimacing;Moaning Pain Intervention(s): Limited activity within patient's tolerance;Monitored during session     Hand Dominance Right   Extremity/Trunk Assessment Upper Extremity Assessment Upper Extremity Assessment: RUE deficits/detail;LUE deficits/detail;Generalized  weakness RUE Deficits / Details: significant chronic dislocation noted to R wrist, but pt with good flex/ext; limited rotation. Grip MMT grossly 4-/5 LUE Deficits / Details: ROM WFL, 2 large fistulas noted to forearm. MMT grossly 4/5 throughout   Lower Extremity Assessment Lower Extremity Assessment: Generalized weakness (ROM WFL)       Communication Communication Communication: No difficulties   Cognition Arousal/Alertness: Awake/alert Behavior During Therapy: WFL for tasks assessed/performed (irritable) Overall Cognitive Status: Within Functional Limits for tasks assessed                                 General Comments: pt somewhat disgruntled/apprehensive about participation with therapy and generally appears unhappy, but is overall cooperative and demos ability to appropriately follow cues/commands.   General Comments  Pt with poor insight, attempted to educate pt on need for nursing assistance for ambulation but pt with poor understanding of learning    Exercises Other Exercises Other Exercises: OT facilitates ed re: role of OT in acute setting and general safety-being aware of lines/leads and correct use of RW, pt minimall receptive to education, but does demo good understanding.   Shoulder Instructions      Home Living Family/patient expects to be discharged to:: Private residence Living Arrangements: Children (adult son) Available Help at Discharge: Family;Available PRN/intermittently Type of Home: House Home Access: Stairs to enter [employer] of Steps: 1   Home Layout: One level     Bathroom Shower/Tub: Tub/shower unit         Home Equipment: Environmental consultant - 2 wheels;Cane - single point;Shower seat          Prior Functioning/Environment Level of Independence: Needs assistance  Gait / Transfers Assistance Needed: RW/cane occasionally for fxl mobility, but reports he primarily uses no AD. ADL's / Homemaking Assistance Needed: INDEP/MOD I  with basic ADLs like bathing with shower seat. Son helps with IADLs such as cooking/cleaning and driving as pt with low vision at baseline            OT Problem List: Decreased strength;Decreased activity tolerance;Cardiopulmonary status limiting activity;Pain      OT Treatment/Interventions: Self-care/ADL training;Therapeutic activities    OT Goals(Current goals can be found in the care plan section) Acute Rehab OT Goals Patient Stated Goal: to go home OT Goal Formulation: All assessment and education complete, DC therapy  OT Frequency:     Barriers to D/C:            Co-evaluation PT/OT/SLP Co-Evaluation/Treatment: Yes Reason for Co-Treatment: Necessary to address cognition/behavior during functional activity PT goals addressed during session: Mobility/safety with mobility OT goals addressed during session: ADL's and self-care      AM-PAC OT "6 Clicks" Daily Activity     Outcome Measure Help from another person eating meals?: None Help from another person taking care  of personal grooming?: None Help from another person toileting, which includes using toliet, bedpan, or urinal?: None Help from another person bathing (including washing, rinsing, drying)?: A Little Help from another person to put on and taking off regular upper body clothing?: None Help from another person to put on and taking off regular lower body clothing?: None 6 Click Score: 23   End of Session Equipment Utilized During Treatment: Rolling walker  Activity Tolerance: Patient tolerated treatment well Patient left: in bed;with call bell/phone within reach;with bed alarm set (seated EOB, declines to lay down, declines to sit in chair)  OT Visit Diagnosis: Unsteadiness on feet (R26.81)                Time: 9480-1655 OT Time Calculation (min): 35 min Charges:  OT General Charges $OT Visit: 1 Visit OT Evaluation $OT Eval Low Complexity: 1 Low OT Treatments $Self Care/Home Management : 8-22  mins  Gerrianne Scale, MS, OTR/L ascom [phone] 02/08/21, 3:00 PM

## 2021-02-08 NOTE — Progress Notes (Signed)
This patient arrived to dialysis with some abdominal discomfort and stated " they gave me some immodium" Complained of urge to move his bowels but refused bed pan and did not have any BM during HD. Upon arrival to HD his oxygen saturation ws in the 80's on room air. Oxygen was given at 2L via [place] during HD with good results. Post rinse back, the patient took off the nasal canula and refused to travel with it. During HD, he had hypotensive episodes, denied symptoms, and stated " I have been having diarrhea today" Dr.Singh was called and made aware of the patient's status.UF was turned off and 248mL bolus was given with good results. After BP improved, UF was turned back on for prime and rinse back only.  Patient refused bedpan while in HD and refused to be transported back to room on oxygen, Report has been given to care RN for the night shift.Richard Zavala

## 2021-02-08 NOTE — Progress Notes (Signed)
Went into patient's room due to bed alarm going off. Patient states he does not want the alarm on. I told him he did not need to be getting up by himself and he said he was fine and did not want the alarm on and he would get up when he wanted to. He got up earlier when his IV was running and jerked the IV out. The IV pole was laying over on the floor and patient had been to the bathroom and back to bed. Instructed patient he needed to call to get help when he wanted to get up.

## 2021-02-08 NOTE — Progress Notes (Signed)
[facility]  ROUNDING NOTE   Subjective:   Mr. ANOTHY BUFANO was admitted to [facility] on 02/06/2021 for Acute cystitis [N30.00] Cystitis [N30.90] Gastrointestinal hemorrhage, unspecified gastrointestinal hemorrhage type [K92.2] COVID-19 [U07.1]  Last hemodialysis treatment was 2/16. Got to 72kg.   Patient presents with GI bleed. Found to have COVID-19 infection.   Patient seen today resting in bed States he is ready to go home Alert and oriented Denies shortness of breath and chest pain. Able to eat meals States he continues to have diarrhea, unsure if bloody  Objective:  Vital signs in last 24 hours:  Temp:  [98 F (36.7 C)-98.6 F (37 C)] 98.2 F (36.8 C) (02/19 0847) Pulse Rate:  [57-99] 59 (02/19 1122) Resp:  [16-20] 16 (02/19 1122) BP: (79-120)/(53-101) 109/97 (02/19 1122) SpO2:  [91 %-93 %] 93 % (02/19 1122)  Weight change:  Filed Weights   02/06/21 1212 02/07/21 0432  Weight: 72 kg 72 kg    Intake/Output: I/O last 3 completed shifts: In: 260 [P.O.:60; IV Piggyback:200] Out: 500 [Other:500]   Intake/Output this shift:  Total I/O In: 240 [P.O.:240] Out: -   Physical Exam: General: NAD,   Head: Normocephalic, atraumatic. Moist oral mucosal membranes  Eyes: Anicteric, PERRL  Neck: Supple, trachea midline  Lungs:  Clear to auscultation  Heart: Regular rate and rhythm  Abdomen:  Soft, nontender,   Extremities:  no peripheral edema.  Neurologic: Nonfocal, moving all four extremities  Skin: No lesions  Access: Left AVF    Basic Metabolic Panel: Recent Labs  Lab 02/06/21 1217 02/06/21 1253 02/07/21 0113 02/08/21 0500  NA 135  --  135 136  K 3.6  --  3.6 3.7  CL 101  --  101 98  CO2 22  --  21* 25  GLUCOSE 98  --  173* 96  BUN 36*  --  43* 32*  CREATININE 5.89*  --  6.57* 5.48*  CALCIUM 7.8*  --  7.7* 7.8*  MG  --  2.0 2.0 2.0  PHOS  --   --  5.0* 4.5    Liver Function Tests: Recent Labs  Lab 02/06/21 1248 02/07/21 0113   AST 35 35  ALT 19 18  ALKPHOS 79 79  BILITOT 0.8 1.0  PROT 5.9* 5.9*  ALBUMIN 2.6* 2.5*   No results for input(s): LIPASE, AMYLASE in the last 168 hours. Recent Labs  Lab 02/06/21 1249  AMMONIA 15    CBC: Recent Labs  Lab 02/06/21 1217 02/06/21 1606 02/06/21 2054 02/07/21 0113 02/07/21 0849 02/07/21 1854 02/08/21 0500  WBC 4.9 4.8  --  4.3  --   --  4.3  NEUTROABS  --   --   --  2.9  --   --   --   HGB 10.4* 9.5* 10.2* 10.1* 10.0* 10.1* 10.7*  HCT 32.0* 29.7* 32.1* 31.6* 31.2* 31.0* 32.8*  MCV 92.5 92.5  --  92.4  --   --  90.6  PLT 106* 98*  --  106*  --   --  116*    Cardiac Enzymes: No results for input(s): CKTOTAL, CKMB, CKMBINDEX, TROPONINI in the last 168 hours.  BNP: Invalid input(s): POCBNP  CBG: Recent Labs  Lab 02/07/21 0449 02/07/21 1221 02/07/21 2309 02/08/21 0549 02/08/21 1307  GLUCAP 115* 82 126* 115* 106*    Microbiology: Results for orders placed or performed during the hospital encounter of 02/06/21  Resp Panel by RT-PCR (Flu A&B, Covid) Nasopharyngeal Swab  Status: Abnormal   Collection Time: 02/06/21  3:26 PM   Specimen: Nasopharyngeal Swab; Nasopharyngeal(NP) swabs in vial transport medium  Result Value Ref Range Status   SARS Coronavirus 2 by RT PCR POSITIVE (A) NEGATIVE Final    Comment: RESULT CALLED TO, READ BACK BY AND VERIFIED WITH: ROBIN REGISTER 02/06/21 1623 KLW (NOTE) SARS-CoV-2 target nucleic acids are DETECTED.  The SARS-CoV-2 RNA is generally detectable in upper respiratory specimens during the acute phase of infection. Positive results are indicative of the presence of the identified virus, but do not rule out bacterial infection or co-infection with other pathogens not detected by the test. Clinical correlation with patient history and other diagnostic information is necessary to determine patient infection status. The expected result is Negative.  Fact Sheet for  Patients: EntrepreneurPulse.com.au  Fact Sheet for Healthcare Providers: IncredibleEmployment.be  This test is not yet approved or cleared by the [place] FDA and  has been authorized for detection and/or diagnosis of SARS-CoV-2 by FDA under an Emergency Use Authorization (EUA).  This EUA will remain in effect (meaning this test can be u sed) for the duration of  the COVID-19 declaration under Section 564(b)(1) of the Act, 21 U.S.C. section 360bbb-3(b)(1), unless the authorization is terminated or revoked sooner.     Influenza A by PCR NEGATIVE NEGATIVE Final   Influenza B by PCR NEGATIVE NEGATIVE Final    Comment: (NOTE) The Xpert Xpress SARS-CoV-2/FLU/RSV plus assay is intended as an aid in the diagnosis of influenza from Nasopharyngeal swab specimens and should not be used as a sole basis for treatment. Nasal washings and aspirates are unacceptable for Xpert Xpress SARS-CoV-2/FLU/RSV testing.  Fact Sheet for Patients: EntrepreneurPulse.com.au  Fact Sheet for Healthcare Providers: IncredibleEmployment.be  This test is not yet approved or cleared by the [place] FDA and has been authorized for detection and/or diagnosis of SARS-CoV-2 by FDA under an Emergency Use Authorization (EUA). This EUA will remain in effect (meaning this test can be used) for the duration of the COVID-19 declaration under Section 564(b)(1) of the Act, 21 U.S.C. section 360bbb-3(b)(1), unless the authorization is terminated or revoked.  Performed at [facility], [address] [place], [place] [address]   Culture, blood (Routine X 2) w Reflex to ID Panel     Status: None (Preliminary result)   Collection Time: 02/06/21  7:29 PM   Specimen: BLOOD  Result Value Ref Range Status   Specimen Description BLOOD BLOOD RIGHT HAND  Final   Special Requests   Final    BOTTLES DRAWN AEROBIC AND ANAEROBIC Blood Culture  adequate volume   Culture   Final    NO GROWTH 2 DAYS Performed at [facility], [address], [place], [place] [address]    Report Status PENDING  Incomplete  Culture, blood (Routine X 2) w Reflex to ID Panel     Status: None (Preliminary result)   Collection Time: 02/06/21  7:30 PM   Specimen: BLOOD  Result Value Ref Range Status   Specimen Description BLOOD BLOOD LEFT HAND  Final   Special Requests   Final    BOTTLES DRAWN AEROBIC AND ANAEROBIC Blood Culture adequate volume   Culture   Final    NO GROWTH 2 DAYS Performed at [facility], [address] [place], [place] [address]    Report Status PENDING  Incomplete    Coagulation Studies: Recent Labs    02/06/21 1920 02/07/21 0113  LABPROT 15.5* 15.6*  INR 1.3* 1.3*  Urinalysis: No results for input(s): COLORURINE, LABSPEC, PHURINE, GLUCOSEU, HGBUR, BILIRUBINUR, KETONESUR, PROTEINUR, UROBILINOGEN, NITRITE, LEUKOCYTESUR in the last 72 hours.  Invalid input(s): APPERANCEUR    Imaging: CT ABDOMEN PELVIS WO CONTRAST  Result Date: 02/06/2021 CLINICAL DATA:  Flank pain.  Black tarry stools. EXAM: CT ABDOMEN AND PELVIS WITHOUT CONTRAST TECHNIQUE: Multidetector CT imaging of the abdomen and pelvis was performed following the standard protocol without IV contrast. COMPARISON:  CT dated March 07, 2020 FINDINGS: Lower chest: There are ground-glass airspace opacities at the lung bases bilaterally, right worse than left. There is interlobular septal thickening. There are small bilateral pleural effusions, right greater than left.There is cardiomegaly. The intracardiac blood pool is hypodense relative to the adjacent myocardium consistent with anemia. Hepatobiliary: The liver is normal. Cholelithiasis without acute inflammation.There is no biliary ductal dilation. Pancreas: The pancreas was poorly evaluated secondary to lack of IV contrast and lack of intra-abdominal fat. Spleen: Unremarkable. Adrenals/Urinary  Tract: --Adrenal glands: Unremarkable. --Right kidney/ureter: The right kidney is atrophic without evidence for hydronephrosis. --Left kidney/ureter: The patient is status post left-sided nephrectomy. --Urinary bladder: There is mild bladder wall thickening. Stomach/Bowel: --Stomach/Duodenum: No hiatal hernia or other gastric abnormality. Normal duodenal course and caliber. --Small bowel: Unremarkable. --Colon: Unremarkable. --Appendix: Normal. Vascular/Lymphatic: Atherosclerotic calcification is present within the non-aneurysmal abdominal aorta, without hemodynamically significant stenosis. --No retroperitoneal lymphadenopathy. --No mesenteric lymphadenopathy. --No pelvic or inguinal lymphadenopathy. Reproductive: Unremarkable Other: No ascites or free air. There are bilateral fat containing inguinal hernias. Musculoskeletal. There is a severe compression fracture of the T12 vertebral body. This is essentially unchanged from prior studies. There is no new acute compression fracture. There is osteopenia. IMPRESSION: 1. Ground-glass airspace opacities at the lung bases bilaterally, right worse than left, with interlobular septal thickening and small bilateral pleural effusions. Findings are suggestive of developing pulmonary edema. An atypical infectious process is not excluded. 2. Cholelithiasis without acute inflammation. 3. Mild bladder wall thickening. Recommend correlation with urinalysis to exclude cystitis. 4. Cardiomegaly. 5. Anemia. 6. Status post prior left nephrectomy. 7. Unchanged compression fracture of the T12 vertebral body. Aortic Atherosclerosis (ICD10-I70.0). Electronically Signed   By: Constance Holster M.D.   On: 02/06/2021 15:13   DG Chest Portable 1 View  Result Date: 02/06/2021 CLINICAL DATA:  COVID positive EXAM: PORTABLE CHEST 1 VIEW COMPARISON:  03/07/2020 FINDINGS: Cardiomegaly with vascular congestion. Small bilateral effusions. Diffuse bilateral interstitial and hazy pulmonary  opacity, suspect pulmonary edema. No pneumothorax. IMPRESSION: Cardiomegaly with vascular congestion, small bilateral effusions and diffuse bilateral interstitial and hazy pulmonary opacity, suspect for pulmonary edema. Electronically Signed   By: Donavan Foil M.D.   On: 02/06/2021 18:19     Medications:   . sodium chloride 250 mL (02/08/21 1317)  . cefTRIAXone (ROCEPHIN)  IV     . atorvastatin  80 mg Oral QHS  . insulin aspart  0-6 Units Subcutaneous Q6H  . linagliptin  5 mg Oral Daily  . pantoprazole (PROTONIX) IV  40 mg Intravenous Q12H   sodium chloride, acetaminophen **OR** acetaminophen, albuterol, benzonatate, fentaNYL (SUBLIMAZE) injection  Assessment/ Plan:  Mr. DUSTYN DANSEREAU is a 60 y.o. white male with end stage renal disease on hemodialysis, CVA, hypertension, pulmonary hypertension, diabetes mellitus type II, diabetic neuropathy, diabetic retinopathy, BPH, coronary artery disease, hyperlipidemia who is admitted to [facility] on 02/06/2021 for Acute cystitis [N30.00] Cystitis [N30.90] Gastrointestinal hemorrhage, unspecified gastrointestinal hemorrhage type [K92.2] COVID-19 [U07.1]  [facility] MWF [facility] Left AVF 72kg  1. End Stage Renal Disease: last hemodialysis was  2/16.  - plan on TTS schedule due to COVID-19 infection.  -Received dialysis yesterday -Removed 573ml of fluid -Scheduled for dialysis today to transition to Covid schedule  2. Hypotension: history of hypertension with home regimen of carvedilol, amlodipine, hydralazine, furosemide and isosorbide mononitrate.  - Holding all blood pressure agents.   3. Anemia with chronic kidney disease:  hemoglobin 10.7 EPO as outpatient.  Will continue to monitor  4. Secondary Hyperparathyroidism: with hyperphosphatemia and hypocalcemia. Not currently on binders.    LOS: 2 [place] 2/19/20222:16 PM

## 2021-02-08 NOTE — TOC Transition Note (Addendum)
Transition of Care [facility]) - CM/SW Discharge Note   Patient Details  Name: Richard Zavala MRN: [id number] Date of Birth: January 20, 1961  [facility] CM/SW Contact:  [name], RN Phone Number: 02/08/2021, 4:14 PM   Clinical Narrative:   Patient to be discharged after dialysis today. Spoke with him and he refused [place] PT or other therapies. Did complain of loose BM's as only request. Son will pick patient up according to sister. Son drives him to Dialysis. High Risk Assessment completed as much as patient would cooperate, and information from sister. Patient was not in a good mood. Today [birth year] his birthday. Communicated with provider and Unit RN via secure chat. [name] RN CM   Update: Pt HD will not finish till late. Will DC tomorrow per provider. [name] RN CM     Final next level of care: Home/Self Care Barriers to Discharge: Barriers Resolved   Patient Goals and CMS Choice        Discharge Placement                       Discharge Plan and Services                DME Arranged: N/A DME Agency: NA       HH Arranged: Refused [place] Agency: NA        Social Determinants of Health (SDOH) Interventions     Readmission Risk Interventions Readmission Risk Prevention Plan 02/08/2021  Transportation Screening Complete  PCP or Specialist Appt within 3-5 Days Complete  HRI or [place] Patient refused  Social Work Consult for [place] Planning/Counseling Patient refused  Palliative Care Screening Patient Refused  Medication Review Press photographer) Referral to Pharmacy  Some recent data might be hidden

## 2021-02-08 NOTE — Plan of Care (Signed)
  Problem: Clinical Measurements: Goal: Diagnostic test results will improve Outcome: Progressing   Problem: Nutrition: Goal: Adequate nutrition will be maintained Outcome: Progressing   Problem: Safety: Goal: Ability to remain free from injury will improve Outcome: Progressing   

## 2021-02-08 NOTE — Progress Notes (Signed)
PROGRESS NOTE   HPI was taken from Dr. Velia Meyer: Richard Zavala is a 60 y.o. male with medical history significant for end-stage renal disease on hemodialysis on Monday, Wednesday, Friday schedule, hypertension, hyperlipidemia, type 2 diabetes mellitus, chronic diastolic heart failure, who is admitted to [facility] on 02/06/2021 with suspected acute upper GI bleed after presenting from home to [facility] ED complaining of dark-colored stool.   The patient reports a total of 3-4 episodes of dark-colored stool over the last 2 to 3 days in the absence of any associated hematochezia.  He reports associated suprapubic discomfort over that timeframe, but denies any associated nausea, vomiting, hematemesis.  No preceding trauma or travel.  Denies any associated fever, chills, rigors, or generalized myalgias.  Denies any associated chest pain, palpitations, presyncope, or syncope.   Reports that he is on no blood thinners at home, including no aspirin.  Denies any recent use of NSAIDs, and denies any recent alcohol consumption.  He also denies any known underlying chronic liver pathology.  Denies recent use of any of the following medications: Systemic steroids, supplemental oral potassium, or supplemental p.o. iron.   He reports a very similar presentation in March 2021, for which he reports undergoing EGD at [facility], although he is unsure as to the results of this endoscopic evaluation.  In the interval from March 2021 at present, the patient denies any interval instances of dark-colored stool up until onset 3 days ago.   Additionally, with the patient has end-stage renal disease on hemodialysis, he reports that he continues to produce urine.  However, over the last 2 to 3 days he notes new onset urinary urgency, but with diminished urine output.  The after mentioned suprapubic discomfort is associated with his urinary urgency.  Denies any overt dysuria or gross hematuria.  He notes that he  was recently attended to his scheduled hemodialysis session yesterday, 02/05/21.   He also notes 3 to 4 days of shortness of breath associated with new onset nonproductive cough.  Denies any associated orthopnea, PND, or new onset peripheral edema.  He also denies any associated hemoptysis, calf tenderness, [place] new onset peripheral erythema.  Not associate with any chest pain.  Denies any recent trauma, travel, surgical procedures, or periods of prolonged diminished activity.  Denies any associated headache, neck stiffness, rhinitis, rhinorrhea, sore throat, rash.  No recent known COVID-19 exposures.  Denies any known chronic underlying pulmonary pathology.   Per chart review, it appears that patient's baseline hemoglobin is around 13, with most recent prior hemoglobin noted to be 12.9 when checked in March 2021.   Richard Zavala  [id number] DOB: October 22, 1961 DOA: 02/06/2021 PCP: Midge Minium, PA   Assessment & Plan:   Principal Problem:   Acute upper GI bleed Active Problems:   End stage renal disease ([place])   Essential hypertension   Type 2 diabetes mellitus with kidney complication, with long-term current use of insulin (HCC)   Acute cystitis   Acute blood loss anemia   COVID-19 virus infection   Melena w/ dark colored stools & guaiac positive stools in ED. Denies NSAID use.  --Melena resolved --No GI procedure planned, per GI --cont IV PPI BID  Anemia, iron def --Hgb ~12 back in Nov 2021.  Currently stable around 10. --Monitor Hgb  COVID19 infection: w/ nonproductive cough otherwise no other symptoms. --cont IV Remdesivir --Hold steroid since not hypoxic  UTI:  --pt complained of dysuria on presentation and started on ceftriaxone, however, UA  didn't result and urine cx not obtained. --No utility of collecting urine sample now since pt has already had 2 doses of ceftriaxone. --treat empirically for 3 days with ceftriaxone  Thrombocytopenia: etiology unclear,  likely secondary to [place]. Will continue to monitor   ESRD: on HD MWF. Nephro consulted  --switch to TTS due to COVID infection --HD today  Hyperphosphatemia: management as per nephro   HLD: continue on statin  DM2:  --BG have been wnl --obtain A1O  Chronic diastolic CHF: appears euvolemic. Monitor I/Os.    Diarrhea likely due to COVID  --No leukocytosis or fever --Imodium x1  Hx of HTN --BP has been intermittently low even without home BP meds --Hold home BP meds   DVT prophylaxis: SCDs Code Status: full  Family Communication: Disposition Plan:  Home with HHPT  Level of care: Med-Surg   Status is: Inpatient  Dispo: The patient is from: Home              Anticipated d/c is to: Home              Anticipated d/c date is: tomorrow              Patient currently is medically stable to d/c.     Difficult to place patient No    Consultants:   GI  nephro    Procedures:   Antimicrobials: ceftriaxone    Subjective: Pt reported no more black stools, however, started having multiple episodes of diarrhea.  No dyspnea.   Objective: Vitals:   02/07/21 2110 02/08/21 0609 02/08/21 0847 02/08/21 1122  BP: (!) 101/55 (!) 110/96 (!) 79/59 (!) 109/97  Pulse: 61 99 (!) 57 (!) 59  Resp: 20 20 20 16   Temp: 98.2 F (36.8 C) 98.6 F (37 C) 98.2 F (36.8 C)   TempSrc: Oral  Oral   SpO2: 93% 91% 91% 93%  Weight:      Height:        Intake/Output Summary (Last 24 hours) at 02/08/2021 1641 Last data filed at 02/08/2021 1518 Gross per 24 hour  Intake 752.26 ml  Output 500 ml  Net 252.26 ml   Filed Weights   02/06/21 1212 02/07/21 0432  Weight: 72 kg 72 kg    Examination:  Constitutional: NAD, AAOx3 HEENT: conjunctivae and lids normal, EOMI CV: No cyanosis.   RESP: normal respiratory effort, on RA Extremities: No effusions, edema in BLE SKIN: warm, dry.  Multiple scabs over both shins. Neuro: II - XII grossly intact.     Data Reviewed: I have  personally reviewed following labs and imaging studies  CBC: Recent Labs  Lab 02/06/21 1217 02/06/21 1606 02/06/21 2054 02/07/21 0113 02/07/21 0849 02/07/21 1854 02/08/21 0500  WBC 4.9 4.8  --  4.3  --   --  4.3  NEUTROABS  --   --   --  2.9  --   --   --   HGB 10.4* 9.5* 10.2* 10.1* 10.0* 10.1* 10.7*  HCT 32.0* 29.7* 32.1* 31.6* 31.2* 31.0* 32.8*  MCV 92.5 92.5  --  92.4  --   --  90.6  PLT 106* 98*  --  106*  --   --  878*   Basic Metabolic Panel: Recent Labs  Lab 02/06/21 1217 02/06/21 1253 02/07/21 0113 02/08/21 0500  NA 135  --  135 136  K 3.6  --  3.6 3.7  CL 101  --  101 98  CO2 22  --  21* 25  GLUCOSE 98  --  173* 96  BUN 36*  --  43* 32*  CREATININE 5.89*  --  6.57* 5.48*  CALCIUM 7.8*  --  7.7* 7.8*  MG  --  2.0 2.0 2.0  PHOS  --   --  5.0* 4.5   GFR: Estimated Creatinine Clearance: 14.2 mL/min (A) (by C-G formula based on SCr of 5.48 mg/dL (H)). Liver Function Tests: Recent Labs  Lab 02/06/21 1248 02/07/21 0113  AST 35 35  ALT 19 18  ALKPHOS 79 79  BILITOT 0.8 1.0  PROT 5.9* 5.9*  ALBUMIN 2.6* 2.5*   No results for input(s): LIPASE, AMYLASE in the last 168 hours. Recent Labs  Lab 02/06/21 1249  AMMONIA 15   Coagulation Profile: Recent Labs  Lab 02/06/21 1920 02/07/21 0113  INR 1.3* 1.3*   Cardiac Enzymes: No results for input(s): CKTOTAL, CKMB, CKMBINDEX, TROPONINI in the last 168 hours. BNP (last 3 results) No results for input(s): PROBNP in the last 8760 hours. HbA1C: No results for input(s): HGBA1C in the last 72 hours. CBG: Recent Labs  Lab 02/07/21 0449 02/07/21 1221 02/07/21 2309 02/08/21 0549 02/08/21 1307  GLUCAP 115* 82 126* 115* 106*   Lipid Profile: No results for input(s): CHOL, HDL, LDLCALC, TRIG, CHOLHDL, LDLDIRECT in the last 72 hours. Thyroid Function Tests: No results for input(s): TSH, T4TOTAL, FREET4, T3FREE, THYROIDAB in the last 72 hours. Anemia Panel: Recent Labs    02/06/21 1606 02/06/21 1920  02/07/21 0113  FOLATE  --  15.4  --   FERRITIN  --  1,352* 1,479*  TIBC  --  144*  --   IRON  --  26*  --   RETICCTPCT 1.2  --   --    Sepsis Labs: Recent Labs  Lab 02/06/21 1920  PROCALCITON 0.73    Recent Results (from the past 240 hour(s))  Resp Panel by RT-PCR (Flu A&B, Covid) Nasopharyngeal Swab     Status: Abnormal   Collection Time: 02/06/21  3:26 PM   Specimen: Nasopharyngeal Swab; Nasopharyngeal(NP) swabs in vial transport medium  Result Value Ref Range Status   SARS Coronavirus 2 by RT PCR POSITIVE (A) NEGATIVE Final    Comment: RESULT CALLED TO, READ BACK BY AND VERIFIED WITH: ROBIN REGISTER 02/06/21 1623 KLW (NOTE) SARS-CoV-2 target nucleic acids are DETECTED.  The SARS-CoV-2 RNA is generally detectable in upper respiratory specimens during the acute phase of infection. Positive results are indicative of the presence of the identified virus, but do not rule out bacterial infection or co-infection with other pathogens not detected by the test. Clinical correlation with patient history and other diagnostic information is necessary to determine patient infection status. The expected result is Negative.  Fact Sheet for Patients: EntrepreneurPulse.com.au  Fact Sheet for Healthcare Providers: IncredibleEmployment.be  This test is not yet approved or cleared by the [place] FDA and  has been authorized for detection and/or diagnosis of SARS-CoV-2 by FDA under an Emergency Use Authorization (EUA).  This EUA will remain in effect (meaning this test can be u sed) for the duration of  the COVID-19 declaration under Section 564(b)(1) of the Act, 21 U.S.C. section 360bbb-3(b)(1), unless the authorization is terminated or revoked sooner.     Influenza A by PCR NEGATIVE NEGATIVE Final   Influenza B by PCR NEGATIVE NEGATIVE Final    Comment: (NOTE) The Xpert Xpress SARS-CoV-2/FLU/RSV plus assay is intended as an aid in the  diagnosis of influenza from Nasopharyngeal swab specimens and should not  be used as a sole basis for treatment. Nasal washings and aspirates are unacceptable for Xpert Xpress SARS-CoV-2/FLU/RSV testing.  Fact Sheet for Patients: EntrepreneurPulse.com.au  Fact Sheet for Healthcare Providers: IncredibleEmployment.be  This test is not yet approved or cleared by the [place] FDA and has been authorized for detection and/or diagnosis of SARS-CoV-2 by FDA under an Emergency Use Authorization (EUA). This EUA will remain in effect (meaning this test can be used) for the duration of the COVID-19 declaration under Section 564(b)(1) of the Act, 21 U.S.C. section 360bbb-3(b)(1), unless the authorization is terminated or revoked.  Performed at [facility], [address] [place], [place] [address]   Culture, blood (Routine X 2) w Reflex to ID Panel     Status: None (Preliminary result)   Collection Time: 02/06/21  7:29 PM   Specimen: BLOOD  Result Value Ref Range Status   Specimen Description BLOOD BLOOD RIGHT HAND  Final   Special Requests   Final    BOTTLES DRAWN AEROBIC AND ANAEROBIC Blood Culture adequate volume   Culture   Final    NO GROWTH 2 DAYS Performed at [facility], [address] [place], [place] [address]    Report Status PENDING  Incomplete  Culture, blood (Routine X 2) w Reflex to ID Panel     Status: None (Preliminary result)   Collection Time: 02/06/21  7:30 PM   Specimen: BLOOD  Result Value Ref Range Status   Specimen Description BLOOD BLOOD LEFT HAND  Final   Special Requests   Final    BOTTLES DRAWN AEROBIC AND ANAEROBIC Blood Culture adequate volume   Culture   Final    NO GROWTH 2 DAYS Performed at [facility], [address] [place],  [address]    Report Status PENDING  Incomplete         Radiology Studies: DG Chest Portable 1 View  Result Date: 02/06/2021 CLINICAL  DATA:  COVID positive EXAM: PORTABLE CHEST 1 VIEW COMPARISON:  03/07/2020 FINDINGS: Cardiomegaly with vascular congestion. Small bilateral effusions. Diffuse bilateral interstitial and hazy pulmonary opacity, suspect pulmonary edema. No pneumothorax. IMPRESSION: Cardiomegaly with vascular congestion, small bilateral effusions and diffuse bilateral interstitial and hazy pulmonary opacity, suspect for pulmonary edema. Electronically Signed   By: Donavan Foil M.D.   On: 02/06/2021 18:19        Scheduled Meds: . atorvastatin  80 mg Oral QHS  . insulin aspart  0-6 Units Subcutaneous Q6H  . linagliptin  5 mg Oral Daily  . loperamide  2 mg Oral Once  . pantoprazole (PROTONIX) IV  40 mg Intravenous Q12H   Continuous Infusions: . sodium chloride Stopped (02/08/21 1511)  . cefTRIAXone (ROCEPHIN)  IV       LOS: 2 days    Enzo Bi, MD [facility] Pager [phone]  If 7PM-7AM, please contact night-coverage 02/08/2021, 4:41 PM

## 2021-02-09 DIAGNOSIS — K922 Gastrointestinal hemorrhage, unspecified: Secondary | ICD-10-CM | POA: Diagnosis not present

## 2021-02-09 LAB — CBC
HCT: 31.9 % — ABNORMAL LOW (ref 39.0–52.0)
Hemoglobin: 10.6 g/dL — ABNORMAL LOW (ref 13.0–17.0)
MCH: 30 pg (ref 26.0–34.0)
MCHC: 33.2 g/dL (ref 30.0–36.0)
MCV: 90.4 fL (ref 80.0–100.0)
Platelets: 142 10*3/uL — ABNORMAL LOW (ref 150–400)
RBC: 3.53 MIL/uL — ABNORMAL LOW (ref 4.22–5.81)
RDW: 16 % — ABNORMAL HIGH (ref 11.5–15.5)
WBC: 5.4 10*3/uL (ref 4.0–10.5)
nRBC: 0 % (ref 0.0–0.2)

## 2021-02-09 LAB — HEMOGLOBIN A1C
Hgb A1c MFr Bld: 5.8 % — ABNORMAL HIGH (ref 4.8–5.6)
Mean Plasma Glucose: 119.76 mg/dL

## 2021-02-09 LAB — BASIC METABOLIC PANEL
Anion gap: 11 (ref 5–15)
BUN: 25 mg/dL — ABNORMAL HIGH (ref 6–20)
CO2: 26 mmol/L (ref 22–32)
Calcium: 7.8 mg/dL — ABNORMAL LOW (ref 8.9–10.3)
Chloride: 98 mmol/L (ref 98–111)
Creatinine, Ser: 4.43 mg/dL — ABNORMAL HIGH (ref 0.61–1.24)
GFR, Estimated: 15 mL/min — ABNORMAL LOW (ref >60)
Glucose, Bld: 88 mg/dL (ref 70–99)
Potassium: 3.7 mmol/L (ref 3.5–5.1)
Sodium: 135 mmol/L (ref 135–145)

## 2021-02-09 LAB — CALCIUM, IONIZED: Calcium, Ionized, Serum: 4.2 mg/dL — ABNORMAL LOW (ref 4.5–5.6)

## 2021-02-09 LAB — GLUCOSE, CAPILLARY
Glucose-Capillary: 87 mg/dL (ref 70–99)
Glucose-Capillary: 80 mg/dL (ref 70–99)

## 2021-02-09 LAB — MAGNESIUM: Magnesium: 1.9 mg/dL (ref 1.7–2.4)

## 2021-02-09 MED ORDER — LOPERAMIDE HCL 2 MG PO CAPS
2.0000 mg | ORAL_CAPSULE | Freq: Once | ORAL | Status: AC
  Administered 2021-02-09: 2 mg via ORAL
  Filled 2021-02-09: qty 1

## 2021-02-09 NOTE — TOC Transition Note (Signed)
Transition of Care [facility]) - CM/SW Discharge Note   Patient Details  Name: Richard Zavala MRN: [id number] Date of Birth: [birth year]  Transition of Care [facility]) CM/SW Contact:  [name], RN Phone Number: 02/09/2021, 9:24 AM   Clinical Narrative:   02/09/21 Patient to be discharged today per provider notes. Patient refused Home Health services after discussion with him by CM and verified by Unit Rn assigned to patient 02/08/21. Lives with son and son will transport home according to sister, CM spoke with yesterday. Had HD late yesterday afternoon. Son transports to HD, which patient will have in [place] for 2 weeks before returning to a [facility] HD chair according to patient. [name] RN CM    Final next level of care: Home/Self Care Barriers to Discharge: Barriers Resolved   Patient Goals and CMS Choice        Discharge Placement                       Discharge Plan and Services                DME Arranged: N/A DME Agency: NA       HH Arranged: Refused [name] Agency: NA        Social Determinants of Health (SDOH) Interventions     Readmission Risk Interventions Readmission Risk Prevention Plan 02/08/2021  Transportation Screening Complete  PCP or Specialist Appt within 3-5 Days Complete  HRI or [place] Patient refused  Social Work Consult for [place] Planning/Counseling Patient refused  Palliative Care Screening Patient Refused  Medication Review Press photographer) Referral to Pharmacy  Some recent data might be hidden

## 2021-02-09 NOTE — Discharge Summary (Signed)
Physician Discharge Summary   Richard Zavala  male DOB: August 20, 1961  KKX:[id number]  PCP: Midge Minium, PA  Admit date: 02/06/2021 Discharge date: 02/09/2021  Admitted From: home Disposition:  home Home Health: Yes CODE STATUS: Full code  Discharge Instructions    Discharge instructions   Complete by: As directed    Please hold your blood pressure medications Amlodipine, Lasix, hydralazine and Imdur until outpatient followup with your doctor due to your intermittent low blood pressure.     Dr. Enzo Bi [facility] Course:  For full details, please see H&P, progress notes, consult notes and ancillary notes.  Briefly,  Richard Strother Perryis a 60 y.o.malewith medical history significant forend-stage renal disease on hemodialysis on Monday, Wednesday, Friday schedule, hypertension, hyperlipidemia, type 2 diabetes mellitus, chronic diastolic heart failure,who was admitted to [facility] on2/17/2022with suspected acute upper GI bleedafter presenting from home to [facility] ED complaining of dark-colored stool.  Melena w/ dark colored stools & guaiac positive stools in ED. Denies NSAID use.  Hgb had been stable around 10's.  Melena resolved the day prior to discharge.  GI consulted with no procedure planned.  Per GI, "Extensive GI workup performed at [facility] in 2021 including DBE was unremarkable for active bleeding or malignancy. Multiple small bowel and colonic polyps were removed revealing jejunal polyps (Possible Peutz Jeghers) and tubular adenomas and hyperplastic polyps, respectively."  Pt received IV PPI BID and transitioned to oral on discharge.  Anemia, iron def Hgb ~12 back in Nov 2021.  Hgb stable ~10 during hospitalization.  COVID19 infection:  w/ nonproductive cough otherwise no other symptoms.  Pt received IV Remdesivir for 3 days.  Steroid not given since pt was not hypoxic.  UTI, POA  pt complained of dysuria on presentation and was  started on ceftriaxone, however, UA didn't result and urine cx not obtained on admission.  Pt was treated empirically for 3 days with ceftriaxone.  Thrombocytopenia:  etiology unclear, likely secondary to [place].   ESRD: on HD MWF.  Nephro consulted.  Switched to TTS due to COVID infection.  Hyperphosphatemia:  management as per nephro   HLD:  continue on statin  DM2, well controlled BG have been wnl.  A1c 5.8.  Chronic diastolic CHF:  appears euvolemic. Home Lasix held due to intermittently low BP.  Diarrhea likely due to COVID gastroenteritis  No leukocytosis or fever.  Pt received Imodium x2 with improvement.  Hx of HTN BP has been intermittently low even without home BP meds.  Home Amlodipine, Lasix, hydralazine and Imdur until outpatient followup.   Discharge Diagnoses:  Principal Problem:   Acute upper GI bleed Active Problems:   End stage renal disease (HCC)   Essential hypertension   Type 2 diabetes mellitus with kidney complication, with long-term current use of insulin (HCC)   Acute cystitis   Acute blood loss anemia   COVID-19 virus infection    Discharge Instructions:  Allergies as of 02/09/2021      Reactions   Metformin Other (See Comments)   Severe diarrhea    Penicillins Hives, Other (See Comments)   Has patient had a PCN reaction causing immediate rash, facial/tongue/throat swelling, SOB or lightheadedness with hypotension:Yes Has patient had a PCN reaction causing severe rash involving mucus membranes or skin necrosis:Yes Has patient had a PCN reaction that required hospitalization:inpatient at the time of reaction. Has patient had a PCN reaction occurring within the last 10 years:No If all  of the above answers are "NO", then may proceed with Cephalosporin use.      Medication List    TAKE these medications   amLODipine 10 MG tablet Commonly known as: NORVASC Hold until followup with outpatient doctor due to intermittent low blood  pressure. What changed:   how much to take  how to take this  when to take this  additional instructions   atorvastatin 80 MG tablet Commonly known as: Lipitor Take 1 tablet (80 mg total) by mouth at bedtime.   carvedilol 12.5 MG tablet Commonly known as: COREG Take 37.5 mg by mouth 2 (two) times daily.   furosemide 40 MG tablet Commonly known as: LASIX Hold until followup with outpatient doctor due to intermittent low blood pressure. What changed:   how much to take  how to take this  when to take this  additional instructions   hydrALAZINE 100 MG tablet Commonly known as: APRESOLINE Hold until followup with outpatient doctor due to intermittent low blood pressure. What changed:   how much to take  how to take this  when to take this  additional instructions   insulin glargine 100 UNIT/ML injection Commonly known as: LANTUS Inject 9-10 Units into the skin daily.   isosorbide mononitrate 30 MG 24 hr tablet Commonly known as: IMDUR Hold until followup with outpatient doctor due to intermittent low blood pressure. What changed:   how much to take  how to take this  when to take this  additional instructions   pantoprazole 40 MG tablet Commonly known as: PROTONIX Take 40 mg by mouth 2 (two) times daily before a meal.        Follow-up Information    Midge Minium, PA. Schedule an appointment as soon as possible for a visit in 1 week(s).   Specialty: Family Medicine Contact information: [place] [address] [phone]               Allergies  Allergen Reactions  . Metformin Other (See Comments)    Severe diarrhea   . Penicillins Hives and Other (See Comments)    Has patient had a PCN reaction causing immediate rash, facial/tongue/throat swelling, SOB or lightheadedness with hypotension:Yes Has patient had a PCN reaction causing severe rash involving mucus membranes or skin necrosis:Yes Has  patient had a PCN reaction that required hospitalization:inpatient at the time of reaction. Has patient had a PCN reaction occurring within the last 10 years:No If all of the above answers are "NO", then may proceed with Cephalosporin use.      The results of significant diagnostics from this hospitalization (including imaging, microbiology, ancillary and laboratory) are listed below for reference.   Consultations:   Procedures/Studies: CT ABDOMEN PELVIS WO CONTRAST  Result Date: 02/06/2021 CLINICAL DATA:  Flank pain.  Black tarry stools. EXAM: CT ABDOMEN AND PELVIS WITHOUT CONTRAST TECHNIQUE: Multidetector CT imaging of the abdomen and pelvis was performed following the standard protocol without IV contrast. COMPARISON:  CT dated March 07, 2020 FINDINGS: Lower chest: There are ground-glass airspace opacities at the lung bases bilaterally, right worse than left. There is interlobular septal thickening. There are small bilateral pleural effusions, right greater than left.There is cardiomegaly. The intracardiac blood pool is hypodense relative to the adjacent myocardium consistent with anemia. Hepatobiliary: The liver is normal. Cholelithiasis without acute inflammation.There is no biliary ductal dilation. Pancreas: The pancreas was poorly evaluated secondary to lack of IV contrast and lack of intra-abdominal fat. Spleen: Unremarkable.  Adrenals/Urinary Tract: --Adrenal glands: Unremarkable. --Right kidney/ureter: The right kidney is atrophic without evidence for hydronephrosis. --Left kidney/ureter: The patient is status post left-sided nephrectomy. --Urinary bladder: There is mild bladder wall thickening. Stomach/Bowel: --Stomach/Duodenum: No hiatal hernia or other gastric abnormality. Normal duodenal course and caliber. --Small bowel: Unremarkable. --Colon: Unremarkable. --Appendix: Normal. Vascular/Lymphatic: Atherosclerotic calcification is present within the non-aneurysmal abdominal aorta, without  hemodynamically significant stenosis. --No retroperitoneal lymphadenopathy. --No mesenteric lymphadenopathy. --No pelvic or inguinal lymphadenopathy. Reproductive: Unremarkable Other: No ascites or free air. There are bilateral fat containing inguinal hernias. Musculoskeletal. There is a severe compression fracture of the T12 vertebral body. This is essentially unchanged from prior studies. There is no new acute compression fracture. There is osteopenia. IMPRESSION: 1. Ground-glass airspace opacities at the lung bases bilaterally, right worse than left, with interlobular septal thickening and small bilateral pleural effusions. Findings are suggestive of developing pulmonary edema. An atypical infectious process is not excluded. 2. Cholelithiasis without acute inflammation. 3. Mild bladder wall thickening. Recommend correlation with urinalysis to exclude cystitis. 4. Cardiomegaly. 5. Anemia. 6. Status post prior left nephrectomy. 7. Unchanged compression fracture of the T12 vertebral body. Aortic Atherosclerosis (ICD10-I70.0). Electronically Signed   By: Constance Holster M.D.   On: 02/06/2021 15:13   DG Chest Portable 1 View  Result Date: 02/06/2021 CLINICAL DATA:  COVID positive EXAM: PORTABLE CHEST 1 VIEW COMPARISON:  03/07/2020 FINDINGS: Cardiomegaly with vascular congestion. Small bilateral effusions. Diffuse bilateral interstitial and hazy pulmonary opacity, suspect pulmonary edema. No pneumothorax. IMPRESSION: Cardiomegaly with vascular congestion, small bilateral effusions and diffuse bilateral interstitial and hazy pulmonary opacity, suspect for pulmonary edema. Electronically Signed   By: Donavan Foil M.D.   On: 02/06/2021 18:19      Labs: BNP (last 3 results) No results for input(s): BNP in the last 8760 hours. Basic Metabolic Panel: Recent Labs  Lab 02/06/21 1217 02/06/21 1253 02/07/21 0113 02/08/21 0500 02/09/21 0538  NA 135  --  135 136 135  K 3.6  --  3.6 3.7 3.7  CL 101  --   101 98 98  CO2 22  --  21* 25 26  GLUCOSE 98  --  173* 96 88  BUN 36*  --  43* 32* 25*  CREATININE 5.89*  --  6.57* 5.48* 4.43*  CALCIUM 7.8*  --  7.7* 7.8* 7.8*  MG  --  2.0 2.0 2.0 1.9  PHOS  --   --  5.0* 4.5  --    Liver Function Tests: Recent Labs  Lab 02/06/21 1248 02/07/21 0113  AST 35 35  ALT 19 18  ALKPHOS 79 79  BILITOT 0.8 1.0  PROT 5.9* 5.9*  ALBUMIN 2.6* 2.5*   No results for input(s): LIPASE, AMYLASE in the last 168 hours. Recent Labs  Lab 02/06/21 1249  AMMONIA 15   CBC: Recent Labs  Lab 02/06/21 1217 02/06/21 1606 02/06/21 2054 02/07/21 0113 02/07/21 0849 02/07/21 1854 02/08/21 0500 02/09/21 0538  WBC 4.9 4.8  --  4.3  --   --  4.3 5.4  NEUTROABS  --   --   --  2.9  --   --   --   --   HGB 10.4* 9.5*   < > 10.1* 10.0* 10.1* 10.7* 10.6*  HCT 32.0* 29.7*   < > 31.6* 31.2* 31.0* 32.8* 31.9*  MCV 92.5 92.5  --  92.4  --   --  90.6 90.4  PLT 106* 98*  --  106*  --   --  116* 142*   < > = values in this interval not displayed.   Cardiac Enzymes: No results for input(s): CKTOTAL, CKMB, CKMBINDEX, TROPONINI in the last 168 hours. BNP: Invalid input(s): POCBNP CBG: Recent Labs  Lab 02/08/21 0831 02/08/21 1307 02/08/21 2244 02/09/21 0515 02/09/21 0817  GLUCAP 113* 106* 87 87 80   D-Dimer No results for input(s): DDIMER in the last 72 hours. Hgb A1c No results for input(s): HGBA1C in the last 72 hours. Lipid Profile No results for input(s): CHOL, HDL, LDLCALC, TRIG, CHOLHDL, LDLDIRECT in the last 72 hours. Thyroid function studies No results for input(s): TSH, T4TOTAL, T3FREE, THYROIDAB in the last 72 hours.  Invalid input(s): FREET3 Anemia work up Recent Labs    02/06/21 1606 02/06/21 1920 02/07/21 0113  FOLATE  --  15.4  --   FERRITIN  --  1,352* 1,479*  TIBC  --  144*  --   IRON  --  26*  --   RETICCTPCT 1.2  --   --    Urinalysis    Component Value Date/Time   COLORURINE Amber 10/27/2012 1056   APPEARANCEUR Turbid  10/27/2012 1056   LABSPEC 1.017 10/27/2012 1056   PHURINE 5.0 10/27/2012 1056   GLUCOSEU >=500 10/27/2012 1056   HGBUR 2+ 10/27/2012 1056   BILIRUBINUR Negative 10/27/2012 1056   KETONESUR Negative 10/27/2012 1056   PROTEINUR >=500 10/27/2012 1056   NITRITE Negative 10/27/2012 1056   LEUKOCYTESUR Negative 10/27/2012 1056   Sepsis Labs Invalid input(s): PROCALCITONIN,  WBC,  LACTICIDVEN Microbiology Recent Results (from the past 240 hour(s))  Resp Panel by RT-PCR (Flu A&B, Covid) Nasopharyngeal Swab     Status: Abnormal   Collection Time: 02/06/21  3:26 PM   Specimen: Nasopharyngeal Swab; Nasopharyngeal(NP) swabs in vial transport medium  Result Value Ref Range Status   SARS Coronavirus 2 by RT PCR POSITIVE (A) NEGATIVE Final    Comment: RESULT CALLED TO, READ BACK BY AND VERIFIED WITH: ROBIN REGISTER 02/06/21 1623 KLW (NOTE) SARS-CoV-2 target nucleic acids are DETECTED.  The SARS-CoV-2 RNA is generally detectable in upper respiratory specimens during the acute phase of infection. Positive results are indicative of the presence of the identified virus, but do not rule out bacterial infection or co-infection with other pathogens not detected by the test. Clinical correlation with patient history and other diagnostic information is necessary to determine patient infection status. The expected result is Negative.  Fact Sheet for Patients: EntrepreneurPulse.com.au  Fact Sheet for Healthcare Providers: IncredibleEmployment.be  This test is not yet approved or cleared by the [place] FDA and  has been authorized for detection and/or diagnosis of SARS-CoV-2 by FDA under an Emergency Use Authorization (EUA).  This EUA will remain in effect (meaning this test can be u sed) for the duration of  the COVID-19 declaration under Section 564(b)(1) of the Act, 21 U.S.C. section 360bbb-3(b)(1), unless the authorization is terminated or revoked  sooner.     Influenza A by PCR NEGATIVE NEGATIVE Final   Influenza B by PCR NEGATIVE NEGATIVE Final    Comment: (NOTE) The Xpert Xpress SARS-CoV-2/FLU/RSV plus assay is intended as an aid in the diagnosis of influenza from Nasopharyngeal swab specimens and should not be used as a sole basis for treatment. Nasal washings and aspirates are unacceptable for Xpert Xpress SARS-CoV-2/FLU/RSV testing.  Fact Sheet for Patients: EntrepreneurPulse.com.au  Fact Sheet for Healthcare Providers: IncredibleEmployment.be  This test is not yet approved or cleared by the [place] and has been authorized for  detection and/or diagnosis of SARS-CoV-2 by FDA under an Emergency Use Authorization (EUA). This EUA will remain in effect (meaning this test can be used) for the duration of the COVID-19 declaration under Section 564(b)(1) of the Act, 21 U.S.C. section 360bbb-3(b)(1), unless the authorization is terminated or revoked.  Performed at [facility], [address] [place], [place] [address]   Culture, blood (Routine X 2) w Reflex to ID Panel     Status: None (Preliminary result)   Collection Time: 02/06/21  7:29 PM   Specimen: BLOOD  Result Value Ref Range Status   Specimen Description BLOOD BLOOD RIGHT HAND  Final   Special Requests   Final    BOTTLES DRAWN AEROBIC AND ANAEROBIC Blood Culture adequate volume   Culture   Final    NO GROWTH 3 DAYS Performed at [facility], [address], [place], [place] [address]    Report Status PENDING  Incomplete  Culture, blood (Routine X 2) w Reflex to ID Panel     Status: None (Preliminary result)   Collection Time: 02/06/21  7:30 PM   Specimen: BLOOD  Result Value Ref Range Status   Specimen Description BLOOD BLOOD LEFT HAND  Final   Special Requests   Final    BOTTLES DRAWN AEROBIC AND ANAEROBIC Blood Culture adequate volume   Culture   Final    NO GROWTH 3 DAYS Performed at  [facility], [address], [place], [place] [address]    Report Status PENDING  Incomplete     Total time spend on discharging this patient, including the last patient exam, discussing the hospital stay, instructions for ongoing care as it relates to all pertinent caregivers, as well as preparing the medical discharge records, prescriptions, and/or referrals as applicable, is 35 minutes.    Enzo Bi, MD  [facility] 02/09/2021, 9:02 AM

## 2021-02-09 NOTE — Progress Notes (Signed)
Richard Zavala to be D/C'd Home per MD order.  Discussed prescriptions and follow up appointments with the patient's Son over phone. Prescriptions given to patient, medication list explained in detail. Patient's Son verbalized understanding.  Allergies as of 02/09/2021      Reactions   Metformin Other (See Comments)   Severe diarrhea    Penicillins Hives, Other (See Comments)   Has patient had a PCN reaction causing immediate rash, facial/tongue/throat swelling, SOB or lightheadedness with hypotension:Yes Has patient had a PCN reaction causing severe rash involving mucus membranes or skin necrosis:Yes Has patient had a PCN reaction that required hospitalization:inpatient at the time of reaction. Has patient had a PCN reaction occurring within the last 10 years:No If all of the above answers are "NO", then may proceed with Cephalosporin use.      Medication List    TAKE these medications   amLODipine 10 MG tablet Commonly known as: NORVASC Hold until followup with outpatient doctor due to intermittent low blood pressure. What changed:   how much to take  how to take this  when to take this  additional instructions   atorvastatin 80 MG tablet Commonly known as: Lipitor Take 1 tablet (80 mg total) by mouth at bedtime.   carvedilol 12.5 MG tablet Commonly known as: COREG Take 37.5 mg by mouth 2 (two) times daily.   furosemide 40 MG tablet Commonly known as: LASIX Hold until followup with outpatient doctor due to intermittent low blood pressure. What changed:   how much to take  how to take this  when to take this  additional instructions   hydrALAZINE 100 MG tablet Commonly known as: APRESOLINE Hold until followup with outpatient doctor due to intermittent low blood pressure. What changed:   how much to take  how to take this  when to take this  additional instructions   insulin glargine 100 UNIT/ML injection Commonly known as: LANTUS Inject 9-10 Units  into the skin daily.   isosorbide mononitrate 30 MG 24 hr tablet Commonly known as: IMDUR Hold until followup with outpatient doctor due to intermittent low blood pressure. What changed:   how much to take  how to take this  when to take this  additional instructions   pantoprazole 40 MG tablet Commonly known as: PROTONIX Take 40 mg by mouth 2 (two) times daily before a meal.       Vitals:   02/09/21 0523 02/09/21 0908  BP: (!) 141/97 131/63  Pulse: 66 66  Resp: 18   Temp: 97.9 F (36.6 C) (!) 97.4 F (36.3 C)  SpO2: 95% 92%    Skin clean, dry and intact without evidence of skin break down, no evidence of skin tears noted. IV catheter discontinued intact. Site without signs and symptoms of complications. Dressing and pressure applied. Pt denies pain at this time. No complaints noted.  An After Visit Summary was printed and given to the patient. Patient escorted via [place], and D/C home via private auto.  Fuller Mandril, RN

## 2021-02-09 NOTE — Plan of Care (Signed)
  Problem: Health Behavior/Discharge Planning: ?Goal: Ability to manage health-related needs will improve ?Outcome: Progressing ?  ?Problem: Elimination: ?Goal: Will not experience complications related to bowel motility ?Outcome: Progressing ?  ?Problem: Safety: ?Goal: Ability to remain free from injury will improve ?Outcome: Progressing ?  ?

## 2021-02-09 NOTE — Progress Notes (Signed)
[facility]  ROUNDING NOTE   Subjective:   [name] was admitted to [facility] on 02/06/2021 for Acute cystitis [N30.00] Cystitis [N30.90] Gastrointestinal hemorrhage, unspecified gastrointestinal hemorrhage type [K92.2] COVID-19 [U07.1]  Last hemodialysis treatment was 2/16. Got to 72kg.   Patient presents with GI bleed. Found to have COVID-19 infection.   Patient seen sitting at side of bed Able to eat meals, no nausea Denies shortness of breath and chest pain Says his stomach feels swollen Continues to have diarrhea   Objective:  Vital signs in last 24 hours:  Temp:  [97.4 F (36.3 C)-98.7 F (37.1 C)] 97.4 F (36.3 C) (02/20 0908) Pulse Rate:  [59-67] 66 (02/20 0908) Resp:  [16-18] 18 (02/20 0523) BP: (84-141)/(56-103) 131/63 (02/20 0908) SpO2:  [87 %-99 %] 92 % (02/20 0908) Weight:  [72.3 kg] 72.3 kg (02/20 0522)  Weight change:  Filed Weights   02/06/21 1212 02/07/21 0432 02/09/21 0522  Weight: 72 kg 72 kg 72.3 kg    Intake/Output: I/O last 3 completed shifts: In: 992.3 [P.O.:780; I.V.:12.3; IV Piggyback:200] Out: -250    Intake/Output this shift:  Total I/O In: 191.7 [IV Piggyback:191.7] Out: -   Physical Exam: General: NAD,   Head: Normocephalic, atraumatic. Moist oral mucosal membranes  Eyes: Anicteric, PERRL  Neck: Supple, trachea midline  Lungs:  Clear to auscultation  Heart: Regular rate and rhythm  Abdomen:  Soft, nontender,   Extremities:  no peripheral edema.  Neurologic: Nonfocal, moving all four extremities  Skin: No lesions  Access: Left AVF    Basic Metabolic Panel: Recent Labs  Lab 02/06/21 1217 02/06/21 1253 02/07/21 0113 02/08/21 0500 02/09/21 0538  NA 135  --  135 136 135  K 3.6  --  3.6 3.7 3.7  CL 101  --  101 98 98  CO2 22  --  21* 25 26  GLUCOSE 98  --  173* 96 88  BUN 36*  --  43* 32* 25*  CREATININE 5.89*  --  6.57* 5.48* 4.43*  CALCIUM 7.8*  --  7.7* 7.8* 7.8*  MG  --  2.0 2.0 2.0 1.9  PHOS   --   --  5.0* 4.5  --     Liver Function Tests: Recent Labs  Lab 02/06/21 1248 02/07/21 0113  AST 35 35  ALT 19 18  ALKPHOS 79 79  BILITOT 0.8 1.0  PROT 5.9* 5.9*  ALBUMIN 2.6* 2.5*   No results for input(s): LIPASE, AMYLASE in the last 168 hours. Recent Labs  Lab 02/06/21 1249  AMMONIA 15    CBC: Recent Labs  Lab 02/06/21 1217 02/06/21 1606 02/06/21 2054 02/07/21 0113 02/07/21 0849 02/07/21 1854 02/08/21 0500 02/09/21 0538  WBC 4.9 4.8  --  4.3  --   --  4.3 5.4  NEUTROABS  --   --   --  2.9  --   --   --   --   HGB 10.4* 9.5*   < > 10.1* 10.0* 10.1* 10.7* 10.6*  HCT 32.0* 29.7*   < > 31.6* 31.2* 31.0* 32.8* 31.9*  MCV 92.5 92.5  --  92.4  --   --  90.6 90.4  PLT 106* 98*  --  106*  --   --  116* 142*   < > = values in this interval not displayed.    Cardiac Enzymes: No results for input(s): CKTOTAL, CKMB, CKMBINDEX, TROPONINI in the last 168 hours.  BNP: Invalid input(s): POCBNP  CBG: Recent  Labs  Lab 02/08/21 0831 02/08/21 1307 02/08/21 2244 02/09/21 0515 02/09/21 0817  GLUCAP 113* 106* 87 87 80    Microbiology: Results for orders placed or performed during the hospital encounter of 02/06/21  Resp Panel by RT-PCR (Flu A&B, Covid) Nasopharyngeal Swab     Status: Abnormal   Collection Time: 02/06/21  3:26 PM   Specimen: Nasopharyngeal Swab; Nasopharyngeal(NP) swabs in vial transport medium  Result Value Ref Range Status   SARS Coronavirus 2 by RT PCR POSITIVE (A) NEGATIVE Final    Comment: RESULT CALLED TO, READ BACK BY AND VERIFIED WITH: ROBIN REGISTER 02/06/21 1623 KLW (NOTE) SARS-CoV-2 target nucleic acids are DETECTED.  The SARS-CoV-2 RNA is generally detectable in upper respiratory specimens during the acute phase of infection. Positive results are indicative of the presence of the identified virus, but do not rule out bacterial infection or co-infection with other pathogens not detected by the test. Clinical correlation with patient  history and other diagnostic information is necessary to determine patient infection status. The expected result is Negative.  Fact Sheet for Patients: EntrepreneurPulse.com.au  Fact Sheet for Healthcare Providers: IncredibleEmployment.be  This test is not yet approved or cleared by the [place] FDA and  has been authorized for detection and/or diagnosis of SARS-CoV-2 by FDA under an Emergency Use Authorization (EUA).  This EUA will remain in effect (meaning this test can be u sed) for the duration of  the COVID-19 declaration under Section 564(b)(1) of the Act, 21 U.S.C. section 360bbb-3(b)(1), unless the authorization is terminated or revoked sooner.     Influenza A by PCR NEGATIVE NEGATIVE Final   Influenza B by PCR NEGATIVE NEGATIVE Final    Comment: (NOTE) The Xpert Xpress SARS-CoV-2/FLU/RSV plus assay is intended as an aid in the diagnosis of influenza from Nasopharyngeal swab specimens and should not be used as a sole basis for treatment. Nasal washings and aspirates are unacceptable for Xpert Xpress SARS-CoV-2/FLU/RSV testing.  Fact Sheet for Patients: EntrepreneurPulse.com.au  Fact Sheet for Healthcare Providers: IncredibleEmployment.be  This test is not yet approved or cleared by the [place] FDA and has been authorized for detection and/or diagnosis of SARS-CoV-2 by FDA under an Emergency Use Authorization (EUA). This EUA will remain in effect (meaning this test can be used) for the duration of the COVID-19 declaration under Section 564(b)(1) of the Act, 21 U.S.C. section 360bbb-3(b)(1), unless the authorization is terminated or revoked.  Performed at [facility], [address] [place], [place] [address]   Culture, blood (Routine X 2) w Reflex to ID Panel     Status: None (Preliminary result)   Collection Time: 02/06/21  7:29 PM   Specimen: BLOOD  Result Value Ref  Range Status   Specimen Description BLOOD BLOOD RIGHT HAND  Final   Special Requests   Final    BOTTLES DRAWN AEROBIC AND ANAEROBIC Blood Culture adequate volume   Culture   Final    NO GROWTH 3 DAYS Performed at [facility], [address] [place], [place] [address]    Report Status PENDING  Incomplete  Culture, blood (Routine X 2) w Reflex to ID Panel     Status: None (Preliminary result)   Collection Time: 02/06/21  7:30 PM   Specimen: BLOOD  Result Value Ref Range Status   Specimen Description BLOOD BLOOD LEFT HAND  Final   Special Requests   Final    BOTTLES DRAWN AEROBIC AND ANAEROBIC Blood Culture adequate volume   Culture  Final    NO GROWTH 3 DAYS Performed at [facility], [address] [place], [place] [address]    Report Status PENDING  Incomplete    Coagulation Studies: Recent Labs    02/06/21 1920 02/07/21 0113  LABPROT 15.5* 15.6*  INR 1.3* 1.3*    Urinalysis: No results for input(s): COLORURINE, LABSPEC, PHURINE, GLUCOSEU, HGBUR, BILIRUBINUR, KETONESUR, PROTEINUR, UROBILINOGEN, NITRITE, LEUKOCYTESUR in the last 72 hours.  Invalid input(s): APPERANCEUR    Imaging: No results found.   Medications:   . sodium chloride Stopped (02/08/21 1511)   . atorvastatin  80 mg Oral QHS  . insulin aspart  0-6 Units Subcutaneous Q6H  . linagliptin  5 mg Oral Daily  . loperamide  2 mg Oral Once  . pantoprazole (PROTONIX) IV  40 mg Intravenous Q12H   sodium chloride, acetaminophen **OR** acetaminophen, albuterol, alum & mag hydroxide-simeth, benzonatate, calcium carbonate, fentaNYL (SUBLIMAZE) injection  Assessment/ Plan:  [name] with end stage renal disease on hemodialysis, CVA, hypertension, pulmonary hypertension, diabetes mellitus type II, diabetic neuropathy, diabetic retinopathy, BPH, coronary artery disease, hyperlipidemia who is admitted to [facility] on 02/06/2021 for Acute cystitis [N30.00] Cystitis  [N30.90] Gastrointestinal hemorrhage, unspecified gastrointestinal hemorrhage type [K92.2] COVID-19 [U07.1]  [facility] Left AVF 72kg  1. End Stage Renal Disease: last hemodialysis was 2/16.  - plan on TTS schedule due to COVID-19 infection.  -We spoke with patient about discharge plan, he says he has transportation to [facility] clinic. His son will take him. -We discuss that this will only be for 1 week, after his quarantine, he will return to the [facility]  2. Hypotension: history of hypertension with home regimen of carvedilol, amlodipine, hydralazine, furosemide and isosorbide mononitrate.  - Holding all blood pressure agents.   3. Anemia with chronic kidney disease:  hemoglobin 10.6 EPO as outpatient.  Will continue to monitor  4. Secondary Hyperparathyroidism: with hyperphosphatemia and hypocalcemia. Not currently on binders.    LOS: 3 Shantelle Breeze 2/20/202210:40 AM

## 2021-02-11 LAB — HEPATITIS B SURFACE ANTIBODY, QUANTITATIVE
Hep B S AB Quant (Post): 34.6 m[IU]/mL (ref >9.9)
Hep B S AB Quant (Post): 35 m[IU]/mL (ref >9.9)

## 2021-02-11 LAB — CULTURE, BLOOD (ROUTINE X 2)
Culture: NO GROWTH
Culture: NO GROWTH
Special Requests: ADEQUATE
Special Requests: ADEQUATE

## 2021-02-12 ENCOUNTER — Emergency Department: Payer: Medicare Other

## 2021-02-12 ENCOUNTER — Inpatient Hospital Stay
Admission: EM | Admit: 2021-02-12 | Discharge: 2021-02-16 | DRG: 189 | Disposition: A | Discharge status: Home / Self Care | Payer: Medicare Other | Attending: Hospitalist | Admitting: Hospitalist

## 2021-02-12 ENCOUNTER — Other Ambulatory Visit: Payer: Self-pay

## 2021-02-12 DIAGNOSIS — R680 Hypothermia, not associated with low environmental temperature: Secondary | ICD-10-CM | POA: Diagnosis present

## 2021-02-12 DIAGNOSIS — N2581 Secondary hyperparathyroidism of renal origin: Secondary | ICD-10-CM | POA: Diagnosis present

## 2021-02-12 DIAGNOSIS — T68XXXA Hypothermia, initial encounter: Secondary | ICD-10-CM | POA: Diagnosis not present

## 2021-02-12 DIAGNOSIS — Z905 Acquired absence of kidney: Secondary | ICD-10-CM | POA: Diagnosis not present

## 2021-02-12 DIAGNOSIS — G9341 Metabolic encephalopathy: Secondary | ICD-10-CM | POA: Diagnosis present

## 2021-02-12 DIAGNOSIS — Z992 Dependence on renal dialysis: Secondary | ICD-10-CM | POA: Diagnosis not present

## 2021-02-12 DIAGNOSIS — I132 Hypertensive heart and chronic kidney disease with heart failure and with stage 5 chronic kidney disease, or end stage renal disease: Secondary | ICD-10-CM | POA: Diagnosis present

## 2021-02-12 DIAGNOSIS — I10 Essential (primary) hypertension: Secondary | ICD-10-CM | POA: Diagnosis present

## 2021-02-12 DIAGNOSIS — I272 Pulmonary hypertension, unspecified: Secondary | ICD-10-CM | POA: Diagnosis present

## 2021-02-12 DIAGNOSIS — I5032 Chronic diastolic (congestive) heart failure: Secondary | ICD-10-CM | POA: Diagnosis present

## 2021-02-12 DIAGNOSIS — A419 Sepsis, unspecified organism: Secondary | ICD-10-CM | POA: Diagnosis not present

## 2021-02-12 DIAGNOSIS — E1122 Type 2 diabetes mellitus with diabetic chronic kidney disease: Secondary | ICD-10-CM | POA: Diagnosis present

## 2021-02-12 DIAGNOSIS — J9601 Acute respiratory failure with hypoxia: Principal | ICD-10-CM | POA: Diagnosis present

## 2021-02-12 DIAGNOSIS — I639 Cerebral infarction, unspecified: Secondary | ICD-10-CM | POA: Diagnosis not present

## 2021-02-12 DIAGNOSIS — M810 Age-related osteoporosis without current pathological fracture: Secondary | ICD-10-CM | POA: Diagnosis present

## 2021-02-12 DIAGNOSIS — D631 Anemia in chronic kidney disease: Secondary | ICD-10-CM | POA: Diagnosis present

## 2021-02-12 DIAGNOSIS — N186 End stage renal disease: Secondary | ICD-10-CM

## 2021-02-12 DIAGNOSIS — E785 Hyperlipidemia, unspecified: Secondary | ICD-10-CM | POA: Diagnosis present

## 2021-02-12 DIAGNOSIS — R410 Disorientation, unspecified: Secondary | ICD-10-CM | POA: Diagnosis present

## 2021-02-12 DIAGNOSIS — N400 Enlarged prostate without lower urinary tract symptoms: Secondary | ICD-10-CM | POA: Diagnosis present

## 2021-02-12 DIAGNOSIS — E1142 Type 2 diabetes mellitus with diabetic polyneuropathy: Secondary | ICD-10-CM | POA: Diagnosis present

## 2021-02-12 DIAGNOSIS — U099 Post covid-19 condition, unspecified: Secondary | ICD-10-CM | POA: Diagnosis present

## 2021-02-12 DIAGNOSIS — E875 Hyperkalemia: Secondary | ICD-10-CM | POA: Diagnosis present

## 2021-02-12 DIAGNOSIS — E119 Type 2 diabetes mellitus without complications: Secondary | ICD-10-CM

## 2021-02-12 DIAGNOSIS — E1165 Type 2 diabetes mellitus with hyperglycemia: Secondary | ICD-10-CM | POA: Diagnosis not present

## 2021-02-12 DIAGNOSIS — Z87891 Personal history of nicotine dependence: Secondary | ICD-10-CM | POA: Diagnosis not present

## 2021-02-12 DIAGNOSIS — E871 Hypo-osmolality and hyponatremia: Secondary | ICD-10-CM | POA: Diagnosis not present

## 2021-02-12 DIAGNOSIS — I9589 Other hypotension: Secondary | ICD-10-CM | POA: Diagnosis present

## 2021-02-12 DIAGNOSIS — Z85528 Personal history of other malignant neoplasm of kidney: Secondary | ICD-10-CM | POA: Diagnosis not present

## 2021-02-12 DIAGNOSIS — Z8673 Personal history of transient ischemic attack (TIA), and cerebral infarction without residual deficits: Secondary | ICD-10-CM

## 2021-02-12 DIAGNOSIS — I2510 Atherosclerotic heart disease of native coronary artery without angina pectoris: Secondary | ICD-10-CM | POA: Diagnosis present

## 2021-02-12 DIAGNOSIS — T380X5A Adverse effect of glucocorticoids and synthetic analogues, initial encounter: Secondary | ICD-10-CM | POA: Diagnosis not present

## 2021-02-12 DIAGNOSIS — Z79899 Other long term (current) drug therapy: Secondary | ICD-10-CM

## 2021-02-12 DIAGNOSIS — U071 COVID-19: Secondary | ICD-10-CM | POA: Diagnosis not present

## 2021-02-12 DIAGNOSIS — Z794 Long term (current) use of insulin: Secondary | ICD-10-CM

## 2021-02-12 DIAGNOSIS — J189 Pneumonia, unspecified organism: Principal | ICD-10-CM | POA: Diagnosis present

## 2021-02-12 DIAGNOSIS — E11319 Type 2 diabetes mellitus with unspecified diabetic retinopathy without macular edema: Secondary | ICD-10-CM | POA: Diagnosis present

## 2021-02-12 DIAGNOSIS — E1129 Type 2 diabetes mellitus with other diabetic kidney complication: Secondary | ICD-10-CM | POA: Diagnosis present

## 2021-02-12 LAB — COMPREHENSIVE METABOLIC PANEL
ALT: 33 U/L (ref 0–44)
AST: 72 U/L — ABNORMAL HIGH (ref 15–41)
Albumin: 2.7 g/dL — ABNORMAL LOW (ref 3.5–5.0)
Alkaline Phosphatase: 266 U/L — ABNORMAL HIGH (ref 38–126)
Anion gap: 12 (ref 5–15)
BUN: 32 mg/dL — ABNORMAL HIGH (ref 6–20)
CO2: 27 mmol/L (ref 22–32)
Calcium: 7.8 mg/dL — ABNORMAL LOW (ref 8.9–10.3)
Chloride: 101 mmol/L (ref 98–111)
Creatinine, Ser: 4.99 mg/dL — ABNORMAL HIGH (ref 0.61–1.24)
GFR, Estimated: 13 mL/min — ABNORMAL LOW (ref >60)
Glucose, Bld: 71 mg/dL (ref 70–99)
Potassium: 3.9 mmol/L (ref 3.5–5.1)
Sodium: 140 mmol/L (ref 135–145)
Total Bilirubin: 1.7 mg/dL — ABNORMAL HIGH (ref 0.3–1.2)
Total Protein: 6.3 g/dL — ABNORMAL LOW (ref 6.5–8.1)

## 2021-02-12 LAB — URINALYSIS, COMPLETE (UACMP) WITH MICROSCOPIC
Bacteria, UA: NONE SEEN
Bilirubin Urine: NEGATIVE
Glucose, UA: NEGATIVE mg/dL
Hgb urine dipstick: NEGATIVE
Ketones, ur: NEGATIVE mg/dL
Leukocytes,Ua: NEGATIVE
Nitrite: NEGATIVE
Protein, ur: 100 mg/dL — AB
Specific Gravity, Urine: 1.011 (ref 1.005–1.030)
Squamous Epithelial / HPF: NONE SEEN (ref 0–5)
pH: 8 (ref 5.0–8.0)

## 2021-02-12 LAB — CBG MONITORING, ED: Glucose-Capillary: 53 mg/dL — ABNORMAL LOW (ref 70–99)

## 2021-02-12 LAB — BLOOD GAS, VENOUS
Acid-Base Excess: 3.1 mmol/L — ABNORMAL HIGH (ref 0.0–2.0)
Bicarbonate: 27 mmol/L (ref 20.0–28.0)
O2 Saturation: 95.5 %
Patient temperature: 37
pCO2, Ven: 38 mmHg — ABNORMAL LOW (ref 44.0–60.0)
pH, Ven: 7.46 — ABNORMAL HIGH (ref 7.250–7.430)
pO2, Ven: 74 mmHg — ABNORMAL HIGH (ref 32.0–45.0)

## 2021-02-12 LAB — SAMPLE TO BLOOD BANK

## 2021-02-12 LAB — CBC WITH DIFFERENTIAL/PLATELET
Abs Immature Granulocytes: 0.04 10*3/uL (ref 0.00–0.07)
Basophils Absolute: 0 10*3/uL (ref 0.0–0.1)
Basophils Relative: 0 %
Eosinophils Absolute: 0.1 10*3/uL (ref 0.0–0.5)
Eosinophils Relative: 2 %
HCT: 33 % — ABNORMAL LOW (ref 39.0–52.0)
Hemoglobin: 10.7 g/dL — ABNORMAL LOW (ref 13.0–17.0)
Immature Granulocytes: 1 %
Lymphocytes Relative: 15 %
Lymphs Abs: 0.7 10*3/uL (ref 0.7–4.0)
MCH: 29.5 pg (ref 26.0–34.0)
MCHC: 32.4 g/dL (ref 30.0–36.0)
MCV: 90.9 fL (ref 80.0–100.0)
Monocytes Absolute: 0.6 10*3/uL (ref 0.1–1.0)
Monocytes Relative: 12 %
Neutro Abs: 3.3 10*3/uL (ref 1.7–7.7)
Neutrophils Relative %: 70 %
Platelets: 139 10*3/uL — ABNORMAL LOW (ref 150–400)
RBC: 3.63 MIL/uL — ABNORMAL LOW (ref 4.22–5.81)
RDW: 16 % — ABNORMAL HIGH (ref 11.5–15.5)
WBC: 4.7 10*3/uL (ref 4.0–10.5)
nRBC: 0 % (ref 0.0–0.2)

## 2021-02-12 LAB — ETHANOL: Alcohol, Ethyl (B): <10 mg/dL (ref <10)

## 2021-02-12 LAB — LACTIC ACID, PLASMA: Lactic Acid, Venous: 0.9 mmol/L (ref 0.5–1.9)

## 2021-02-12 LAB — LIPASE, BLOOD: Lipase: 31 U/L (ref 11–51)

## 2021-02-12 LAB — GLUCOSE, CAPILLARY
Glucose-Capillary: 110 mg/dL — ABNORMAL HIGH (ref 70–99)
Glucose-Capillary: 264 mg/dL — ABNORMAL HIGH (ref 70–99)

## 2021-02-12 LAB — T4, FREE: Free T4: 1.42 ng/dL — ABNORMAL HIGH (ref 0.61–1.12)

## 2021-02-12 LAB — TSH: TSH: 1.094 u[IU]/mL (ref 0.350–4.500)

## 2021-02-12 LAB — METHYLMALONIC ACID, SERUM: Methylmalonic Acid, Quantitative: 816 nmol/L — ABNORMAL HIGH (ref 0–378)

## 2021-02-12 LAB — PROCALCITONIN: Procalcitonin: 0.57 ng/mL

## 2021-02-12 LAB — AMMONIA: Ammonia: 14 umol/L (ref 9–35)

## 2021-02-12 MED ORDER — HALOPERIDOL LACTATE 5 MG/ML IJ SOLN
2.5000 mg | Freq: Once | INTRAMUSCULAR | Status: AC
  Administered 2021-02-12: 2.5 mg via INTRAVENOUS
  Filled 2021-02-12: qty 1

## 2021-02-12 MED ORDER — SODIUM CHLORIDE 0.9 % IV SOLN
2.0000 g | INTRAVENOUS | Status: DC
  Administered 2021-02-12 – 2021-02-13 (×2): 2 g via INTRAVENOUS
  Filled 2021-02-12: qty 2
  Filled 2021-02-12: qty 20

## 2021-02-12 MED ORDER — SODIUM CHLORIDE 0.9 % IV SOLN
500.0000 mg | INTRAVENOUS | Status: DC
  Administered 2021-02-12: 500 mg via INTRAVENOUS
  Filled 2021-02-12: qty 500

## 2021-02-12 MED ORDER — SODIUM CHLORIDE 0.9 % IV SOLN
100.0000 mg | Freq: Two times a day (BID) | INTRAVENOUS | Status: DC
  Filled 2021-02-12 (×2): qty 100

## 2021-02-12 MED ORDER — ATORVASTATIN CALCIUM 20 MG PO TABS
80.0000 mg | ORAL_TABLET | Freq: Every day | ORAL | Status: DC
  Administered 2021-02-12 – 2021-02-15 (×4): 80 mg via ORAL
  Filled 2021-02-12 (×2): qty 4
  Filled 2021-02-12: qty 1
  Filled 2021-02-12: qty 4

## 2021-02-12 MED ORDER — HYDRALAZINE HCL 20 MG/ML IJ SOLN: 5.0000 mg | INTRAMUSCULAR | Status: DC | PRN

## 2021-02-12 MED ORDER — ACETAMINOPHEN 650 MG RE SUPP: 650.0000 mg | Freq: Four times a day (QID) | RECTAL | Status: DC | PRN

## 2021-02-12 MED ORDER — ALBUTEROL SULFATE HFA 108 (90 BASE) MCG/ACT IN AERS
2.0000 | INHALATION_SPRAY | RESPIRATORY_TRACT | Status: DC | PRN
  Filled 2021-02-12: qty 6.7

## 2021-02-12 MED ORDER — INSULIN ASPART 100 UNIT/ML ~~LOC~~ SOLN
0.0000 [IU] | Freq: Every day | SUBCUTANEOUS | Status: DC
  Administered 2021-02-12: 2 [IU] via SUBCUTANEOUS
  Filled 2021-02-12: qty 1

## 2021-02-12 MED ORDER — INSULIN GLARGINE 100 UNIT/ML ~~LOC~~ SOLN
5.0000 [IU] | Freq: Every day | SUBCUTANEOUS | Status: DC
  Administered 2021-02-13 – 2021-02-15 (×3): 5 [IU] via SUBCUTANEOUS
  Filled 2021-02-12 (×5): qty 0.05

## 2021-02-12 MED ORDER — CHLORHEXIDINE GLUCONATE CLOTH 2 % EX PADS
6.0000 | MEDICATED_PAD | Freq: Every day | CUTANEOUS | Status: DC
  Administered 2021-02-16: 6 via TOPICAL
  Filled 2021-02-12: qty 6

## 2021-02-12 MED ORDER — PANTOPRAZOLE SODIUM 40 MG PO TBEC
40.0000 mg | DELAYED_RELEASE_TABLET | Freq: Two times a day (BID) | ORAL | Status: DC
  Administered 2021-02-12 – 2021-02-16 (×7): 40 mg via ORAL
  Filled 2021-02-12 (×7): qty 1

## 2021-02-12 MED ORDER — ACETAMINOPHEN 325 MG PO TABS
650.0000 mg | ORAL_TABLET | Freq: Four times a day (QID) | ORAL | Status: DC | PRN
  Administered 2021-02-15: 21:00:00 650 mg via ORAL
  Filled 2021-02-12: qty 2

## 2021-02-12 MED ORDER — SODIUM CHLORIDE 0.9 % IV BOLUS
1000.0000 mL | Freq: Once | INTRAVENOUS | Status: AC
  Administered 2021-02-12: 1000 mL via INTRAVENOUS

## 2021-02-12 MED ORDER — DM-GUAIFENESIN ER 30-600 MG PO TB12
1.0000 | ORAL_TABLET | Freq: Two times a day (BID) | ORAL | Status: DC | PRN
  Administered 2021-02-12: 1 via ORAL
  Filled 2021-02-12: qty 1

## 2021-02-12 MED ORDER — INSULIN ASPART 100 UNIT/ML ~~LOC~~ SOLN
0.0000 [IU] | Freq: Three times a day (TID) | SUBCUTANEOUS | Status: DC
  Administered 2021-02-13: 3 [IU] via SUBCUTANEOUS
  Administered 2021-02-13: 12:00:00 5 [IU] via SUBCUTANEOUS
  Administered 2021-02-14: 17:00:00 1 [IU] via SUBCUTANEOUS
  Administered 2021-02-14 (×2): 2 [IU] via SUBCUTANEOUS
  Administered 2021-02-15: 09:00:00 3 [IU] via SUBCUTANEOUS
  Administered 2021-02-15: 2 [IU] via SUBCUTANEOUS
  Administered 2021-02-15: 3 [IU] via SUBCUTANEOUS
  Administered 2021-02-16: 5 [IU] via SUBCUTANEOUS
  Filled 2021-02-12 (×9): qty 1

## 2021-02-12 MED ORDER — METHYLPREDNISOLONE SODIUM SUCC 40 MG IJ SOLR
40.0000 mg | Freq: Two times a day (BID) | INTRAMUSCULAR | Status: DC
  Administered 2021-02-12 – 2021-02-16 (×8): 40 mg via INTRAVENOUS
  Filled 2021-02-12 (×8): qty 1

## 2021-02-12 NOTE — Progress Notes (Signed)
Hypoglycemic Event  CBG: 53  Treatment: 4 oz juice/soda  Symptoms: None  Follow-up CBG: [id number] CBG Result:110  Possible Reasons for Event: Inadequate meal intake    Richard Zavala

## 2021-02-12 NOTE — ED Notes (Signed)
Pt given urinal to provide urine sample. Son at bedside and also informed of the need to send urine sample to lab

## 2021-02-12 NOTE — Plan of Care (Signed)
  Problem: Education: Goal: Knowledge of risk factors and measures for prevention of condition will improve Outcome: Progressing   Problem: Coping: Goal: Psychosocial and spiritual needs will be supported Outcome: Progressing   Problem: Respiratory: Goal: Will maintain a patent airway Outcome: Progressing Goal: Complications related to the disease process, condition or treatment will be avoided or minimized Outcome: Progressing   

## 2021-02-12 NOTE — ED Notes (Signed)
Fistulas noted in both right and left arms. MD states to initiate IV access on left arm above fistula's.

## 2021-02-12 NOTE — H&P (Addendum)
History and Physical    Richard Zavala LPF:[id number] DOB: [birth year] DOA: 02/12/2021  Referring MD/NP/PA:   PCP: Midge Minium, PA   Patient coming from:  The patient is coming from home.  At baseline, pt is independent for most of ADL.        Chief Complaint: AMS  HPI: Richard Zavala is a 60 y.o. male with medical history significant of ESRD-HD (MWF), HTN, HLD, DM, stroke, Wilm's tumordCHF, CAD, GIB, recent admission due to covid19 infection, who presents with AMS.  Patient has AMS and is unable to provide accurate medical history. I called his son by phone who has provided limited information. The history is limited.  Patient was recently hospitalized from 2/17-2/20 due to GI bleeding and COVID-19 infection.  Patient was treated with remdesivir for 3 days. Per his son, pt was okay last night, but found to be confused this AM. Pt continues to have cough, shortness breath, does not seem to have fever, chills, chest pain.  Per his son, patient had some nausea, vomiting and diarrhea at home, but no active nausea, vomiting or diarrhea noted in the ED.  Patient did not have new GI bleeding after he went home.  When I saw patient in the ED, he is confused, he knows his own name, is not orientated to the time and place.  He moves all extremities.  No facial droop or slurred speech.  Patient had a dialysis yesterday.  ED Course: pt was found to have ammonia level 14, alcohol level less than 10, lactic acid of 0.9, urinalysis negative, potassium 3.9, bicarbonate of 27, creatinine 4.99, BUN 32, hypothermia with body temperature 90.5, blood pressure 109/53, heart rate 49, 50, RR 21, 16, oxygen saturation 87% on room air, which improved to 95% on 2 L oxygen.  CT of head is negative for acute intracranial abnormalities.  Patient is admitted to progressive bed as inpatient.  Chest x-ray showed Persistent cardiomegaly with pulmonary vascular congestion. Suspect interstitial edema with a degree of  congestive heart failure. It should be noted that atypical organism pneumonia superimposed is possible and may be present concurrently with pulmonary edema. Appearance essentially stable compared to most recent study.  Carotid artery calcification noted bilaterally  Review of Systems: Could not reviewed due to altered mental status   Allergy:  Allergies  Allergen Reactions  . Metformin Other (See Comments)    Severe diarrhea   . Penicillins Hives and Other (See Comments)    Has patient had a PCN reaction causing immediate rash, facial/tongue/throat swelling, SOB or lightheadedness with hypotension:Yes Has patient had a PCN reaction causing severe rash involving mucus membranes or skin necrosis:Yes Has patient had a PCN reaction that required hospitalization:inpatient at the time of reaction. Has patient had a PCN reaction occurring within the last 10 years:No If all of the above answers are "NO", then may proceed with Cephalosporin use.     Past Medical History:  Diagnosis Date  . Anemia   . Blind   . BPH (benign prostatic hyperplasia)   . CAD (coronary artery disease)   . CHF (congestive heart failure) ([place])   . Chronic kidney disease (CKD), stage IV (severe) (HCC)    DIALYSIS  . Diabetes mellitus without complication ([place])   . ESRD (end stage renal disease) ([place])    Monday-Wednesday-Friday dialysis  . Hyperlipemia   . Hypertension   . Neuropathy   . Osteoporosis   . Pulmonary HTN ([place])   . Stroke [facility])  2013  . TIA (transient ischemic attack)   . TRD (traction retinal detachment)   . Wilms' tumor [facility])     Past Surgical History:  Procedure Laterality Date  . A/V FISTULAGRAM Left 01/28/2017   Procedure: A/V Fistulagram;  Surgeon: Algernon Huxley, MD;  Location: [place] [facility];  Service: Cardiovascular;  Laterality: Left;  . A/V FISTULAGRAM Left 04/28/2018   Procedure: A/V FISTULAGRAM;  Surgeon: Algernon Huxley, MD;  Location: [place] [facility];  Service:  Cardiovascular;  Laterality: Left;  . A/V FISTULAGRAM N/A 03/21/2020   Procedure: A/V FISTULAGRAM;  Surgeon: Algernon Huxley, MD;  Location: [place] [facility];  Service: Cardiovascular;  Laterality: N/A;  . AV FISTULA PLACEMENT    . AV FISTULA PLACEMENT Right 11/23/2019   Procedure: ARTERIOVENOUS (AV) FISTULA CREATION (RADIOCEPHALIC );  Surgeon: Algernon Huxley, MD;  Location: [facility] ORS;  Service: Vascular;  Laterality: Right;  . CARDIAC CATHETERIZATION    . CATARACT EXTRACTION W/PHACO Right 05/14/2016   Procedure: CATARACT EXTRACTION PHACO AND INTRAOCULAR LENS PLACEMENT (IOC);  Surgeon: Eulogio Bear, MD;  Location: [facility] ORS;  Service: Ophthalmology;  Laterality: Right;  [place] 1.46 AP% 11.5 CDE 12.33 FLUID PACK LOT # [id number]  . EYE SURGERY    . FRACTURE SURGERY     RIGHT LEG WITH ROD  . HIP SURGERY    . NEPHRECTOMY RADICAL    . PERIPHERAL VASCULAR CATHETERIZATION N/A 08/28/2015   Procedure: A/V Shuntogram/Fistulagram;  Surgeon: Algernon Huxley, MD;  Location: [place] CV LAB;  Service: Cardiovascular;  Laterality: N/A;  . PERIPHERAL VASCULAR CATHETERIZATION Left 08/28/2015   Procedure: A/V Shunt Intervention;  Surgeon: Algernon Huxley, MD;  Location: [place] [facility];  Service: Cardiovascular;  Laterality: Left;  . UPPER EXTREMITY ANGIOGRAPHY Right 08/01/2020   Procedure: UPPER EXTREMITY ANGIOGRAPHY;  Surgeon: Algernon Huxley, MD;  Location: [place] [facility];  Service: Cardiovascular;  Laterality: Right;    Social History:  reports that he quit smoking about 8 years ago. His smoking use included cigarettes. He smoked 1.00 pack per day. He has never used smokeless tobacco. He reports that he does not drink alcohol and does not use drugs.  Family History:  Family History  Problem Relation Age of Onset  . CAD Father   . COPD Father   . CAD Paternal Grandfather   . Diabetes Maternal Aunt   . Diabetes Maternal Uncle      Prior to Admission medications   Medication Sig Start Date End Date  Taking? Authorizing Provider  amLODipine (NORVASC) 10 MG tablet Hold until followup with outpatient doctor due to intermittent low blood pressure. 02/08/21  Yes Enzo Bi, MD  carvedilol (COREG) 12.5 MG tablet Take 37.5 mg by mouth 2 (two) times daily.   Yes [provider]  furosemide (LASIX) 40 MG tablet Hold until followup with outpatient doctor due to intermittent low blood pressure. 02/08/21  Yes Enzo Bi, MD  hydrALAZINE (APRESOLINE) 100 MG tablet Hold until followup with outpatient doctor due to intermittent low blood pressure. 02/08/21  Yes Enzo Bi, MD  insulin glargine (LANTUS) 100 UNIT/ML injection Inject 9-10 Units into the skin daily.    Yes [provider]  isosorbide mononitrate (IMDUR) 30 MG 24 hr tablet Hold until followup with outpatient doctor due to intermittent low blood pressure. 02/08/21  Yes Enzo Bi, MD  pantoprazole (PROTONIX) 40 MG tablet Take 40 mg by mouth 2 (two) times daily before a meal. 01/14/21  Yes [provider]  atorvastatin (LIPITOR) 80 MG tablet Take 1 tablet (80 mg total) by mouth at bedtime. 02/08/21   Enzo Bi, MD  lidocaine (LIDODERM) 5 % Place 1 patch onto the skin daily. 11/04/20   [provider]    Physical Exam: Vitals:   02/12/21 0930 02/12/21 1400 02/12/21 1415 02/12/21 1608  BP: (!) 109/53 (!) 119/54  121/74  Pulse: (!) 50 (!) 58  60  Resp: 16 16  18   Temp:   97.7 F (36.5 C) 97.8 F (36.6 C)  TempSrc:   Oral Oral  SpO2: 95% 91%  96%  Weight:      Height:       General: Not in acute distress HEENT:       Eyes:  EOMI, no scleral icterus.       ENT: No discharge from the ears and nose       Neck: No JVD, no bruit, no mass felt. Heme: No neck lymph node enlargement. Cardiac: S1/S2, RRR, No murmurs, No gallops or rubs. Respiratory: Fine crackles bilaterally GI: Soft, nondistended, nontender, no organomegaly, BS present. GU: No hematuria Ext: No pitting leg edema bilaterally. 1+DP/PT pulse  bilaterally. Musculoskeletal: No joint deformities, No joint redness or warmth, no limitation of ROM in spin. Skin: No rashes.  Neuro: Confused, knows his own name, not oriented to time and place, cranial nerves II-XII grossly intact, moves all extremities  psych: Patient is not psychotic, no suicidal or hemocidal ideation.  Labs on Admission: I have personally reviewed following labs and imaging studies  CBC: Recent Labs  Lab 02/06/21 1606 02/06/21 2054 02/07/21 0113 02/07/21 0849 02/07/21 1854 02/08/21 0500 02/09/21 0538 02/12/21 0842  WBC 4.8  --  4.3  --   --  4.3 5.4 4.7  NEUTROABS  --   --  2.9  --   --   --   --  3.3  HGB 9.5*   < > 10.1* 10.0* 10.1* 10.7* 10.6* 10.7*  HCT 29.7*   < > 31.6* 31.2* 31.0* 32.8* 31.9* 33.0*  MCV 92.5  --  92.4  --   --  90.6 90.4 90.9  PLT 98*  --  106*  --   --  116* 142* 139*   < > = values in this interval not displayed.   Basic Metabolic Panel: Recent Labs  Lab 02/06/21 1217 02/06/21 1253 02/07/21 0113 02/08/21 0500 02/09/21 0538 02/12/21 0842  NA 135  --  135 136 135 140  K 3.6  --  3.6 3.7 3.7 3.9  CL 101  --  101 98 98 101  CO2 22  --  21* 25 26 27   GLUCOSE 98  --  173* 96 88 71  BUN 36*  --  43* 32* 25* 32*  CREATININE 5.89*  --  6.57* 5.48* 4.43* 4.99*  CALCIUM 7.8*  --  7.7* 7.8* 7.8* 7.8*  MG  --  2.0 2.0 2.0 1.9  --   PHOS  --   --  5.0* 4.5  --   --    GFR: Estimated Creatinine Clearance: 15.6 mL/min (A) (by C-G formula based on SCr of 4.99 mg/dL (H)). Liver Function Tests: Recent Labs  Lab 02/06/21 1248 02/07/21 0113 02/12/21 0842  AST 35 35 72*  ALT 19 18 33  ALKPHOS 79 79 266*  BILITOT 0.8 1.0 1.7*  PROT 5.9* 5.9* 6.3*  ALBUMIN 2.6* 2.5* 2.7*   Recent Labs  Lab 02/12/21 0842  LIPASE 31  Recent Labs  Lab 02/06/21 1249 02/12/21 0842  AMMONIA 15 14   Coagulation Profile: Recent Labs  Lab 02/06/21 1920 02/07/21 0113  INR 1.3* 1.3*   Cardiac Enzymes: No results for input(s): CKTOTAL,  CKMB, CKMBINDEX, TROPONINI in the last 168 hours. BNP (last 3 results) No results for input(s): PROBNP in the last 8760 hours. HbA1C: No results for input(s): HGBA1C in the last 72 hours. CBG: Recent Labs  Lab 02/08/21 2244 02/09/21 0515 02/09/21 0817 02/12/21 1604 02/12/21 1649  GLUCAP 87 87 80 53* 110*   Lipid Profile: No results for input(s): CHOL, HDL, LDLCALC, TRIG, CHOLHDL, LDLDIRECT in the last 72 hours. Thyroid Function Tests: Recent Labs    02/12/21 0842  TSH 1.094  FREET4 1.42*   Anemia Panel: No results for input(s): VITAMINB12, FOLATE, FERRITIN, TIBC, IRON, RETICCTPCT in the last 72 hours. Urine analysis:    Component Value Date/Time   COLORURINE YELLOW (A) 02/12/2021 1006   APPEARANCEUR CLEAR (A) 02/12/2021 1006   APPEARANCEUR Turbid 10/27/2012 1056   LABSPEC 1.011 02/12/2021 1006   LABSPEC 1.017 10/27/2012 1056   PHURINE 8.0 02/12/2021 1006   GLUCOSEU NEGATIVE 02/12/2021 1006   GLUCOSEU >=500 10/27/2012 1056   HGBUR NEGATIVE 02/12/2021 1006   BILIRUBINUR NEGATIVE 02/12/2021 1006   BILIRUBINUR Negative 10/27/2012 [place] 02/12/2021 1006   PROTEINUR 100 (A) 02/12/2021 1006   NITRITE NEGATIVE 02/12/2021 1006   LEUKOCYTESUR NEGATIVE 02/12/2021 1006   LEUKOCYTESUR Negative 10/27/2012 1056   Sepsis Labs: @LABRCNTIP (procalcitonin:4,lacticidven:4) ) Recent Results (from the past 240 hour(s))  Resp Panel by RT-PCR (Flu A&B, Covid) Nasopharyngeal Swab     Status: Abnormal   Collection Time: 02/06/21  3:26 PM   Specimen: Nasopharyngeal Swab; Nasopharyngeal(NP) swabs in vial transport medium  Result Value Ref Range Status   SARS Coronavirus 2 by RT PCR POSITIVE (A) NEGATIVE Final    Comment: RESULT CALLED TO, READ BACK BY AND VERIFIED WITH: ROBIN REGISTER 02/06/21 1623 KLW (NOTE) SARS-CoV-2 target nucleic acids are DETECTED.  The SARS-CoV-2 RNA is generally detectable in upper respiratory specimens during the acute phase of infection.  Positive results are indicative of the presence of the identified virus, but do not rule out bacterial infection or co-infection with other pathogens not detected by the test. Clinical correlation with patient history and other diagnostic information is necessary to determine patient infection status. The expected result is Negative.  Fact Sheet for Patients: EntrepreneurPulse.com.au  Fact Sheet for Healthcare Providers: IncredibleEmployment.be  This test is not yet approved or cleared by the [place] FDA and  has been authorized for detection and/or diagnosis of SARS-CoV-2 by FDA under an Emergency Use Authorization (EUA).  This EUA will remain in effect (meaning this test can be u sed) for the duration of  the COVID-19 declaration under Section 564(b)(1) of the Act, 21 U.S.C. section 360bbb-3(b)(1), unless the authorization is terminated or revoked sooner.     Influenza A by PCR NEGATIVE NEGATIVE Final   Influenza B by PCR NEGATIVE NEGATIVE Final    Comment: (NOTE) The Xpert Xpress SARS-CoV-2/FLU/RSV plus assay is intended as an aid in the diagnosis of influenza from Nasopharyngeal swab specimens and should not be used as a sole basis for treatment. Nasal washings and aspirates are unacceptable for Xpert Xpress SARS-CoV-2/FLU/RSV testing.  Fact Sheet for Patients: EntrepreneurPulse.com.au  Fact Sheet for Healthcare Providers: IncredibleEmployment.be  This test is not yet approved or cleared by the [place] FDA and has been authorized for detection and/or diagnosis  of SARS-CoV-2 by FDA under an Emergency Use Authorization (EUA). This EUA will remain in effect (meaning this test can be used) for the duration of the COVID-19 declaration under Section 564(b)(1) of the Act, 21 U.S.C. section 360bbb-3(b)(1), unless the authorization is terminated or revoked.  Performed at [facility],  [address] [place], [place] [address]   Culture, blood (Routine X 2) w Reflex to ID Panel     Status: None   Collection Time: 02/06/21  7:29 PM   Specimen: BLOOD  Result Value Ref Range Status   Specimen Description BLOOD BLOOD RIGHT HAND  Final   Special Requests   Final    BOTTLES DRAWN AEROBIC AND ANAEROBIC Blood Culture adequate volume   Culture   Final    NO GROWTH 5 DAYS Performed at [facility], [address], [place], [place] [address]    Report Status 02/11/2021 FINAL  Final  Culture, blood (Routine X 2) w Reflex to ID Panel     Status: None   Collection Time: 02/06/21  7:30 PM   Specimen: BLOOD  Result Value Ref Range Status   Specimen Description BLOOD BLOOD LEFT HAND  Final   Special Requests   Final    BOTTLES DRAWN AEROBIC AND ANAEROBIC Blood Culture adequate volume   Culture   Final    NO GROWTH 5 DAYS Performed at [facility], [address] [place], [place] [address]    Report Status 02/11/2021 FINAL  Final     Radiological Exams on Admission: CT Head Wo Contrast  Result Date: 02/12/2021 CLINICAL DATA:  60 year old male with unexplained altered mental status. History of GI bleeding this month. EXAM: CT HEAD WITHOUT CONTRAST TECHNIQUE: Contiguous axial images were obtained from the base of the skull through the vertex without intravenous contrast. COMPARISON:  Brain MRI 08/21/2016.  Head CT 08/21/2016. FINDINGS: Brain: Mild generalized cerebral volume loss since 2017. No midline shift, ventriculomegaly, mass effect, evidence of mass lesion, intracranial hemorrhage or evidence of cortically based acute infarction. Patchy and confluent chronic hypodensity in the bilateral corona radiata, bilateral deep gray nuclei (greater on the left). Gray-white matter differentiation appears stable since 2017. No definite cortical encephalomalacia. Vascular: Extensive Calcified atherosclerosis at the skull base. No suspicious intracranial vascular  hyperdensity. Skull: No acute osseous abnormality identified. Sinuses/Orbits: New left [facility] obstructive pattern of paranasal sinus disease since 2017. Complete opacification of the left frontal, anterior ethmoid and visible left maxillary sinuses. Other sinuses remain well aerated. Tympanic cavities and mastoids remain clear. Other: Generalized scalp vessel calcified atherosclerosis. Chronic postoperative changes to both globes, greater on the left. IMPRESSION: 1. No acute intracranial abnormality. Stable appearance of advanced chronic small vessel disease since 2017. 2. New left [facility] pattern obstructive paranasal sinus disease since 2017. 3. Advanced calcified atherosclerosis of scalp vessels, suggesting end stage renal disease. Electronically Signed   By: Genevie Ann M.D.   On: 02/12/2021 10:00   DG Chest Portable 1 View  Result Date: 02/12/2021 CLINICAL DATA:  COVID-19 positive. Weakness and altered mental status EXAM: PORTABLE CHEST 1 VIEW COMPARISON:  February 06, 2021 FINDINGS: There is persistent cardiomegaly with pulmonary venous hypertension. Interstitium remains diffusely prominent, likely due to interstitial pulmonary edema. No consolidation or well-defined airspace opacity. No adenopathy. There is calcification in each carotid artery. IMPRESSION: Persistent cardiomegaly with pulmonary vascular congestion. Suspect interstitial edema with a degree of congestive heart failure. It should be noted that atypical organism pneumonia superimposed is possible and may be present  concurrently with pulmonary edema. Appearance essentially stable compared to most recent study. Carotid artery calcification noted bilaterally. Electronically Signed   By: Lowella Grip III M.D.   On: 02/12/2021 09:02     EKG: I have personally reviewed.  Sinus rhythm, QTC 546, poor quality of EKG strips, RAD, nonspecific T wave change   Assessment/Plan Principal Problem:   Acute respiratory failure with hypoxia (HCC) Active  Problems:   Essential hypertension   Type II diabetes mellitus with renal manifestations ([place])   COVID-19 virus infection   Sepsis ([place])   Acute metabolic encephalopathy   Anemia in ESRD (end-stage renal disease) ([place])   HLD (hyperlipidemia)   Stroke ([place])   Hypothermia   ESRD on hemodialysis ([place])   CAP (community acquired pneumonia)   Acute respiratory failure with hypoxia: Oxygen desaturated to 87% on room air, which improved to 95% on 2 L oxygen.  Likely multifactorial etiology, including sequela of recent COVID-19 infection, mild interstitial pulmonary edema secondary to ESRD and cannot completely rule out bacterial pneumonia, will start antibiotics empirically.  -Admitted to progressive unit as inpatient - IV Rocephin and doxycycline -Solu-Medrol 40 mg twice daily - Mucinex for cough  - Bronchodilators - Urine legionella and S. pneumococcal antigen - Follow up blood culture x2, sputum culture -will repeat CXR 2 view  Sepsis due to possible CAP and COVID-19 virus infection: Patient meets criteria for sepsis with hypothermia, tachypnea with RR 21.  Lactic acid normal 0.9. -Patient received 1 L IV fluid in ED, will not give more IV fluid -Check procalcitonin level  COVID-19 virus infection: Patient completed 3-day course of remdesivir in previous admission -Bronchodilators -Solu-Medrol 40 mg twice daily  Essential hypertension: Blood pressures are soft: 109/53 -Hold home blood pressure medications, amlodipine, Coreg, Lasix, hydralazine, -Hold Imdur -IV hydralazine as needed  Type II diabetes mellitus with renal manifestations [facility]): Recent A1c 5.8, well controlled. -Sliding scale insulin -Decrease home Lantus dose from 9-10 unit to 5 unit daily  Acute metabolic encephalopathy: Likely multifactorial etiology.  CT head is negative for acute intracranial abnormalities.  No focal deficit on physical examination -Frequent neuro check  ESRD-HD (MWF) -Dr. Candiss Norse is consulted  for dialysis  Anemia in ESRD (end-stage renal disease) ([place]): Hemoglobin stable, 10.7 -Follow-up by CBC  HLD (hyperlipidemia) -Lipitor  Stroke ([place]) -On Lipitor  Hypothermia -Bair hugger         DVT ppx: SCD Code Status: Full code Family Communication:    Yes, patient's   son by phone Disposition Plan:  Anticipate discharge back to previous environment Consults called: Dr. Candiss Norse of nephrology Admission status and Level of care: Progressive Cardiac:   as inpt      Status is: Inpatient  Remains inpatient appropriate because:Inpatient level of care appropriate due to severity of illness   Dispo: The patient is from: Home              Anticipated d/c is to: Home              Anticipated d/c date is: 2 days              Patient currently is not medically stable to d/c.   Difficult to place patient No           Date of Service 02/12/2021    [place] [facility]   If 7PM-7AM, please contact night-coverage [email or web address] 02/12/2021, 6:35 PM

## 2021-02-12 NOTE — ED Provider Notes (Signed)
[facility] Emergency Department Provider Note  ____________________________________________  Time seen: Approximately 11:42 AM  I have reviewed the triage vital signs and the nursing notes.   HISTORY  Chief Complaint Altered Mental Status    Level 5 Caveat: Portions of the History and Physical including HPI and review of systems are unable to be completely obtained due to patient being a poor historian   HPI Richard Zavala is a 60 y.o. male with a history of CAD CHF end-stage renal disease on hemodialysis diabetes hypertension who was recently diagnosed with COVID, hospitalized for treatment and associated GI bleed and UTI.  Was discharged and had been doing well at home until this morning, when his family member that lives with him went to check on him.  They noted that the TV was on unusually loud.  When they found the patient he seemed to be unconscious.  They were able to wake him but he was severely confused, did not know where he was or what was going on.  He had urinated in the bed.  No vomiting or diarrhea.  Had dialysis yesterday.      Past Medical History:  Diagnosis Date  . Anemia   . Blind   . BPH (benign prostatic hyperplasia)   . CAD (coronary artery disease)   . CHF (congestive heart failure) ([place])   . Chronic kidney disease (CKD), stage IV (severe) (HCC)    DIALYSIS  . Diabetes mellitus without complication ([place])   . ESRD (end stage renal disease) ([place])    Monday-Wednesday-Friday dialysis  . Hyperlipemia   . Hypertension   . Neuropathy   . Osteoporosis   . Pulmonary HTN ([place])   . Stroke [facility])    2013  . TIA (transient ischemic attack)   . TRD (traction retinal detachment)   . Wilms' tumor [facility])      Patient Active Problem List   Diagnosis Date Noted  . Sepsis ([place]) 02/12/2021  . Acute cystitis 02/06/2021  . Acute upper GI bleed 02/06/2021  . Acute blood loss anemia 02/06/2021  . COVID-19 virus infection 02/06/2021  . Gait  instability 10/12/2019  . Pulmonary nodule 10/12/2019  . CAD (coronary artery disease), native coronary artery 03/08/2018  . Hyperlipidemia 03/08/2018  . Essential hypertension 03/08/2018  . Wilms' tumor with favorable history 03/01/2018  . Closed fracture of right distal radius 08/22/2016  . TIA (transient ischemic attack) 08/21/2016  . Encounter for pre-transplant evaluation for kidney transplant 05/17/2016  . Anemia due to other cause   . Hyperkalemia   . Hypoglycemia   . Pain   . End stage renal disease ([place])   . Weakness   . Other chest pain   . Elevated troponin   . Hyperkalemia, diminished renal excretion 08/14/2015  . Hypertensive urgency 03/26/2015  . Anemia in CKD (chronic kidney disease) 12/04/2014  . Chronic kidney disease, stage IV (severe) ([place]) 12/03/2014  . Acute ischemic stroke ([place]) 08/25/2014  . Acute lacunar stroke ([place]) 06/25/2014  . Orthostatic hypotension 06/25/2014  . Diabetic vitreous hemorrhage associated with type 2 diabetes mellitus ([place]) 09/28/2013  . PDR (proliferative diabetic retinopathy) ([place]) 09/28/2013  . History of Wilms' tumor 08/15/2013  . Iron deficiency anemia 07/21/2013  . Mesenteric artery stenosis ([place]) 03/27/2013  . Heart failure, chronic systolic ([place]) 21/19/4174  . Ischemic cardiomyopathy 03/13/2013  . Pulmonary HTN ([place]) 03/13/2013  . Peripheral edema 02/28/2013  . Diabetic macular edema of both eyes ([place]) 01/16/2013  . Corneal epithelial defect 10/06/2012  .  TRD (traction retinal detachment) 09/22/2012  . Vitreous hemorrhage of both eyes ([place]) 07/21/2012  . Closed right hip fracture ([place]) 02/19/2012  . Diabetic sensorimotor polyneuropathy ([place]) 01/27/2012  . Vitamin D deficiency disease 11/30/2011  . Diabetes type 2, uncontrolled ([place]) 07/03/2011  . Type 2 diabetes mellitus with kidney complication, with long-term current use of insulin ([place]) 07/03/2011  . Osteoporosis with fracture 05/28/2011     Past Surgical History:   Procedure Laterality Date  . A/V FISTULAGRAM Left 01/28/2017   Procedure: A/V Fistulagram;  Surgeon: Algernon Huxley, MD;  Location: [place] CV LAB;  Service: Cardiovascular;  Laterality: Left;  . A/V FISTULAGRAM Left 04/28/2018   Procedure: A/V FISTULAGRAM;  Surgeon: Algernon Huxley, MD;  Location: [place] CV LAB;  Service: Cardiovascular;  Laterality: Left;  . A/V FISTULAGRAM N/A 03/21/2020   Procedure: A/V FISTULAGRAM;  Surgeon: Algernon Huxley, MD;  Location: [place] [facility];  Service: Cardiovascular;  Laterality: N/A;  . AV FISTULA PLACEMENT    . AV FISTULA PLACEMENT Right 11/23/2019   Procedure: ARTERIOVENOUS (AV) FISTULA CREATION (RADIOCEPHALIC );  Surgeon: Algernon Huxley, MD;  Location: [facility] ORS;  Service: Vascular;  Laterality: Right;  . CARDIAC CATHETERIZATION    . CATARACT EXTRACTION W/PHACO Right 05/14/2016   Procedure: CATARACT EXTRACTION PHACO AND INTRAOCULAR LENS PLACEMENT (IOC);  Surgeon: Eulogio Bear, MD;  Location: [facility] ORS;  Service: Ophthalmology;  Laterality: Right;  [place] 1.46 AP% 11.5 CDE 12.33 FLUID PACK LOT # [id number]  . EYE SURGERY    . FRACTURE SURGERY     RIGHT LEG WITH ROD  . HIP SURGERY    . NEPHRECTOMY RADICAL    . PERIPHERAL VASCULAR CATHETERIZATION N/A 08/28/2015   Procedure: A/V Shuntogram/Fistulagram;  Surgeon: Algernon Huxley, MD;  Location: [place] CV LAB;  Service: Cardiovascular;  Laterality: N/A;  . PERIPHERAL VASCULAR CATHETERIZATION Left 08/28/2015   Procedure: A/V Shunt Intervention;  Surgeon: Algernon Huxley, MD;  Location: [place] CV LAB;  Service: Cardiovascular;  Laterality: Left;  . UPPER EXTREMITY ANGIOGRAPHY Right 08/01/2020   Procedure: UPPER EXTREMITY ANGIOGRAPHY;  Surgeon: Algernon Huxley, MD;  Location: [place] CV LAB;  Service: Cardiovascular;  Laterality: Right;     Prior to Admission medications   Medication Sig Start Date End Date Taking? Authorizing Provider  amLODipine (NORVASC) 10 MG tablet Hold until followup with  outpatient doctor due to intermittent low blood pressure. 02/08/21  Yes Enzo Bi, MD  carvedilol (COREG) 12.5 MG tablet Take 37.5 mg by mouth 2 (two) times daily.   Yes [provider]  furosemide (LASIX) 40 MG tablet Hold until followup with outpatient doctor due to intermittent low blood pressure. 02/08/21  Yes Enzo Bi, MD  hydrALAZINE (APRESOLINE) 100 MG tablet Hold until followup with outpatient doctor due to intermittent low blood pressure. 02/08/21  Yes Enzo Bi, MD  insulin glargine (LANTUS) 100 UNIT/ML injection Inject 9-10 Units into the skin daily.    Yes [provider]  isosorbide mononitrate (IMDUR) 30 MG 24 hr tablet Hold until followup with outpatient doctor due to intermittent low blood pressure. 02/08/21  Yes Enzo Bi, MD  pantoprazole (PROTONIX) 40 MG tablet Take 40 mg by mouth 2 (two) times daily before a meal. 01/14/21  Yes [provider]  atorvastatin (LIPITOR) 80 MG tablet Take 1 tablet (80 mg total) by mouth at bedtime. 02/08/21   Enzo Bi, MD  lidocaine (LIDODERM) 5 % Place 1 patch onto the skin daily. 11/04/20  [provider]     Allergies Metformin and Penicillins   Family History  Problem Relation Age of Onset  . CAD Father   . COPD Father   . CAD Paternal Grandfather   . Diabetes Maternal Aunt   . Diabetes Maternal Uncle     Social History Social History   Tobacco Use  . Smoking status: Former Smoker    Packs/day: 1.00    Types: Cigarettes    Quit date: 06/26/2012    Years since quitting: 8.6  . Smokeless tobacco: Never Used  Vaping Use  . Vaping Use: Never used  Substance Use Topics  . Alcohol use: No  . Drug use: No    Review of Systems Level 5 Caveat: Portions of the History and Physical including HPI and review of systems are unable to be completely obtained due to patient being a poor historian   Constitutional:   No known fever.  ENT:   No rhinorrhea. Cardiovascular:   No chest pain or  syncope. Respiratory:   No dyspnea or cough. Gastrointestinal:   Negative for abdominal pain, vomiting and diarrhea.  Musculoskeletal:   Negative for focal pain or swelling ____________________________________________   PHYSICAL EXAM:  VITAL SIGNS: ED Triage Vitals  Enc Vitals Group     BP 02/12/21 0815 (!) 149/136     Pulse Rate 02/12/21 0815 65     Resp 02/12/21 0815 14     Temp 02/12/21 0834 (!) 90.5 F (32.5 C)     Temp Source 02/12/21 0834 Rectal     SpO2 02/12/21 0815 96 %     Weight 02/12/21 0818 158 lb 11.7 oz (72 kg)     Height 02/12/21 0818 5\' 8"  (1.727 m)     Head Circumference --      Peak Flow --      Pain Score 02/12/21 0816 0     Pain Loc --      Pain Edu? --      Excl. in [place]? --     Vital signs reviewed, nursing assessments reviewed.   Constitutional:   Awake and alert.  Not oriented..  Eyes:   Conjunctivae are normal. EOMI. PERRL. ENT      Head:   Normocephalic and atraumatic.      Nose:   No congestion/rhinnorhea.       Mouth/Throat:   MMM, no pharyngeal erythema. No peritonsillar mass.       Neck:   No meningismus. Full ROM. Hematological/Lymphatic/Immunilogical:   No cervical lymphadenopathy. Cardiovascular:   Bradycardia heart rate 50. Symmetric bilateral radial and DP pulses.  No murmurs. Cap refill less than 2 seconds. Respiratory:   Normal respiratory effort without tachypnea/retractions. Breath sounds are clear and equal bilaterally. No wheezes/rales/rhonchi. Gastrointestinal:   Soft and nontender. Non distended. There is no CVA tenderness.  No rebound, rigidity, or guarding.  Musculoskeletal:   Normal range of motion in all extremities. No joint effusions.  No lower extremity tenderness.  No edema. Neurologic:   Slurred speech. Motor grossly intact. No acute focal neurologic deficits are appreciated.  Skin:    Skin is warm, dry and intact. No rash noted.  No petechiae, purpura, or bullae.  ____________________________________________     LABS (pertinent positives/negatives) (all labs ordered are listed, but only abnormal results are displayed) Labs Reviewed  COMPREHENSIVE METABOLIC PANEL - Abnormal; Notable for the following components:      Result Value   BUN 32 (*)    Creatinine, Ser 4.99 (*)  Calcium 7.8 (*)    Total Protein 6.3 (*)    Albumin 2.7 (*)    AST 72 (*)    Alkaline Phosphatase 266 (*)    Total Bilirubin 1.7 (*)    GFR, Estimated 13 (*)    All other components within normal limits  CBC WITH DIFFERENTIAL/PLATELET - Abnormal; Notable for the following components:   RBC 3.63 (*)    Hemoglobin 10.7 (*)    HCT 33.0 (*)    RDW 16.0 (*)    Platelets 139 (*)    All other components within normal limits  BLOOD GAS, VENOUS - Abnormal; Notable for the following components:   pH, Ven 7.46 (*)    pCO2, Ven 38 (*)    pO2, Ven 74.0 (*)    Acid-Base Excess 3.1 (*)    All other components within normal limits  URINALYSIS, COMPLETE (UACMP) WITH MICROSCOPIC - Abnormal; Notable for the following components:   Color, Urine YELLOW (*)    APPearance CLEAR (*)    Protein, ur 100 (*)    All other components within normal limits  T4, FREE - Abnormal; Notable for the following components:   Free T4 1.42 (*)    All other components within normal limits  CULTURE, BLOOD (SINGLE)  URINE CULTURE  CULTURE, BLOOD (SINGLE)  ETHANOL  LIPASE, BLOOD  LACTIC ACID, PLASMA  AMMONIA  TSH  PROCALCITONIN  SAMPLE TO BLOOD BANK   ____________________________________________   EKG  Interpreted by me Sinus rhythm, bradycardia rate of 50.  Right axis.  Normal intervals.  Poor R wave progression.  Normal ST segment and T wave.  ____________________________________________    RADIOLOGY  CT Head Wo Contrast  Result Date: 02/12/2021 CLINICAL DATA:  60 year old male with unexplained altered mental status. History of GI bleeding this month. EXAM: CT HEAD WITHOUT CONTRAST TECHNIQUE: Contiguous axial images were obtained  from the base of the skull through the vertex without intravenous contrast. COMPARISON:  Brain MRI 08/21/2016.  Head CT 08/21/2016. FINDINGS: Brain: Mild generalized cerebral volume loss since 2017. No midline shift, ventriculomegaly, mass effect, evidence of mass lesion, intracranial hemorrhage or evidence of cortically based acute infarction. Patchy and confluent chronic hypodensity in the bilateral corona radiata, bilateral deep gray nuclei (greater on the left). Gray-white matter differentiation appears stable since 2017. No definite cortical encephalomalacia. Vascular: Extensive Calcified atherosclerosis at the skull base. No suspicious intracranial vascular hyperdensity. Skull: No acute osseous abnormality identified. Sinuses/Orbits: New left [facility] obstructive pattern of paranasal sinus disease since 2017. Complete opacification of the left frontal, anterior ethmoid and visible left maxillary sinuses. Other sinuses remain well aerated. Tympanic cavities and mastoids remain clear. Other: Generalized scalp vessel calcified atherosclerosis. Chronic postoperative changes to both globes, greater on the left. IMPRESSION: 1. No acute intracranial abnormality. Stable appearance of advanced chronic small vessel disease since 2017. 2. New left [facility] pattern obstructive paranasal sinus disease since 2017. 3. Advanced calcified atherosclerosis of scalp vessels, suggesting end stage renal disease. Electronically Signed   By: Genevie Ann M.D.   On: 02/12/2021 10:00   DG Chest Portable 1 View  Result Date: 02/12/2021 CLINICAL DATA:  COVID-19 positive. Weakness and altered mental status EXAM: PORTABLE CHEST 1 VIEW COMPARISON:  February 06, 2021 FINDINGS: There is persistent cardiomegaly with pulmonary venous hypertension. Interstitium remains diffusely prominent, likely due to interstitial pulmonary edema. No consolidation or well-defined airspace opacity. No adenopathy. There is calcification in each carotid artery. IMPRESSION:  Persistent cardiomegaly with pulmonary vascular congestion. Suspect interstitial edema  with a degree of congestive heart failure. It should be noted that atypical organism pneumonia superimposed is possible and may be present concurrently with pulmonary edema. Appearance essentially stable compared to most recent study. Carotid artery calcification noted bilaterally. Electronically Signed   By: Lowella Grip III M.D.   On: 02/12/2021 09:02    ____________________________________________   PROCEDURES .Critical Care Performed by: Carrie Mew, MD Authorized by: Carrie Mew, MD   Critical care provider statement:    Critical care time (minutes):  35   Critical care time was exclusive of:  Separately billable procedures and treating other patients   Critical care was necessary to treat or prevent imminent or life-threatening deterioration of the following conditions:  CNS failure or compromise, dehydration and sepsis   Critical care was time spent personally by me on the following activities:  Development of treatment plan with patient or surrogate, discussions with consultants, evaluation of patient's response to treatment, examination of patient, obtaining history from patient or surrogate, ordering and performing treatments and interventions, ordering and review of laboratory studies, ordering and review of radiographic studies, pulse oximetry, re-evaluation of patient's condition and review of old charts    ____________________________________________  DIFFERENTIAL DIAGNOSIS   Intracranial hemorrhage, pneumonia, dehydration, electrolyte abnormality, seizure  CLINICAL IMPRESSION / ASSESSMENT AND PLAN / ED COURSE  Medications ordered in the ED: Medications  cefTRIAXone (ROCEPHIN) 2 g in sodium chloride 0.9 % 100 mL IVPB (0 g Intravenous Stopped 02/12/21 1022)  azithromycin (ZITHROMAX) 500 mg in sodium chloride 0.9 % 250 mL IVPB (500 mg Intravenous New Bag/Given 02/12/21 1107)   methylPREDNISolone sodium succinate (SOLU-MEDROL) 40 mg/mL injection 40 mg (has no administration in time range)  albuterol (VENTOLIN HFA) 108 (90 Base) MCG/ACT inhaler 2 puff (has no administration in time range)  dextromethorphan-guaiFENesin (MUCINEX DM) 30-600 MG per 12 hr tablet 1 tablet (has no administration in time range)  sodium chloride 0.9 % bolus 1,000 mL (1,000 mLs Intravenous New Bag/Given 02/12/21 0847)  haloperidol lactate (HALDOL) injection 2.5 mg (2.5 mg Intravenous Given 02/12/21 0914)    Pertinent labs & imaging results that were available during my care of the patient were reviewed by me and considered in my medical decision making (see chart for details).   Richard Zavala was evaluated in Emergency Department on 02/12/2021 for the symptoms described in the history of present illness. He was evaluated in the context of the global COVID-19 pandemic, which necessitated consideration that the patient might be at risk for infection with the SARS-CoV-2 virus that causes COVID-19. Institutional protocols and algorithms that pertain to the evaluation of patients at risk for COVID-19 are in a state of rapid change based on information released by regulatory bodies including the CDC and federal and state organizations. These policies and algorithms were followed during the patient's care in the ED.     Clinical Course as of 02/12/21 1142  Wed Feb 12, 2021  0932 Patient presents with confusion, found to be hypothermic and hypoxic. Lab panel consistent with end-stage renal disease and otherwise unremarkable. Bear hugger placed, will give antibiotics and plan to [employer]. [PS]    Clinical Course User Index [PS] Carrie Mew, MD     ----------------------------------------- 11:45 AM on 02/12/2021 -----------------------------------------  Initial lab panel reassuring.  Creatinine consistent with end-stage renal disease.  CT head unremarkable, chest x-ray viewed and  interpreted by me, concerning for pneumonia.  Rocephin and azithromycin ordered, Solu-Medrol ordered.  ____________________________________________   FINAL CLINICAL IMPRESSION(S) / ED DIAGNOSES  Final diagnoses:  Community acquired pneumonia, unspecified laterality  Delirium  COVID-19 virus infection     ED Discharge Orders    None      Portions of this note were generated with dragon dictation software. Dictation errors may occur despite best attempts at proofreading.   Carrie Mew, MD 02/12/21 1146

## 2021-02-12 NOTE — ED Notes (Signed)
Pt transported to CT ?

## 2021-02-12 NOTE — ED Triage Notes (Signed)
pt arrives via ems from home. ems reports current AMS, seen here for gi bleed d/c on 20th. family said pt fine last night, woke up confused, turned tv up too loud. normally walks with walker but unsteady and weak this am. dialysis yesterday. Ems states family reported pt tested positive for covid on 2/17 MD present on arrival for assessment  ems vitals cbg 94 104/72  96% RA 54 HR

## 2021-02-12 NOTE — ED Notes (Signed)
Son at bedside due to confusion and AMS

## 2021-02-12 NOTE — Progress Notes (Signed)
Patient admitted to room 253. Patient able to transfer self from stretcher to bed by scoot method. VSS at admission, however BG 53; pt asymptomatic. Patient able to answer all orientation questions. Bed alarm activated, call bell within reach.

## 2021-02-12 NOTE — ED Notes (Signed)
Pt continuously repeating "im cold" bair hugger in place at this time due to rectal temp of 90.5. Pt continues to move around in bed causing warming blanket to move off pt. Blanket placed back into position and pt informed to try to remain still in order to keep blanket in place to increase body temp.

## 2021-02-12 NOTE — ED Notes (Signed)
Documentation completed by Berniece Salines SRN reviewed and approved by this RN

## 2021-02-12 NOTE — ED Notes (Signed)
Unable to obtain second set of cultures due to limited vascular access. Lab contacted to potentially collect second set but states they are unable to stick either arm due to bilateral  Fistula's. MD states second set not needed at this time

## 2021-02-13 DIAGNOSIS — J9601 Acute respiratory failure with hypoxia: Secondary | ICD-10-CM | POA: Diagnosis not present

## 2021-02-13 LAB — BASIC METABOLIC PANEL
Anion gap: 12 (ref 5–15)
BUN: 46 mg/dL — ABNORMAL HIGH (ref 6–20)
CO2: 24 mmol/L (ref 22–32)
Calcium: 7.5 mg/dL — ABNORMAL LOW (ref 8.9–10.3)
Chloride: 101 mmol/L (ref 98–111)
Creatinine, Ser: 6.21 mg/dL — ABNORMAL HIGH (ref 0.61–1.24)
GFR, Estimated: 10 mL/min — ABNORMAL LOW (ref >60)
Glucose, Bld: 245 mg/dL — ABNORMAL HIGH (ref 70–99)
Potassium: 4.6 mmol/L (ref 3.5–5.1)
Sodium: 137 mmol/L (ref 135–145)

## 2021-02-13 LAB — CBC
HCT: 31.4 % — ABNORMAL LOW (ref 39.0–52.0)
Hemoglobin: 10.2 g/dL — ABNORMAL LOW (ref 13.0–17.0)
MCH: 29.5 pg (ref 26.0–34.0)
MCHC: 32.5 g/dL (ref 30.0–36.0)
MCV: 90.8 fL (ref 80.0–100.0)
Platelets: 162 10*3/uL (ref 150–400)
RBC: 3.46 MIL/uL — ABNORMAL LOW (ref 4.22–5.81)
RDW: 15.9 % — ABNORMAL HIGH (ref 11.5–15.5)
WBC: 2.9 10*3/uL — ABNORMAL LOW (ref 4.0–10.5)
nRBC: 0 % (ref 0.0–0.2)

## 2021-02-13 LAB — GLUCOSE, CAPILLARY
Glucose-Capillary: 231 mg/dL — ABNORMAL HIGH (ref 70–99)
Glucose-Capillary: 281 mg/dL — ABNORMAL HIGH (ref 70–99)
Glucose-Capillary: 135 mg/dL — ABNORMAL HIGH (ref 70–99)
Glucose-Capillary: 173 mg/dL — ABNORMAL HIGH (ref 70–99)

## 2021-02-13 LAB — HIV ANTIBODY (ROUTINE TESTING W REFLEX): HIV Screen 4th Generation wRfx: NONREACTIVE

## 2021-02-13 LAB — URINE CULTURE: Culture: NO GROWTH

## 2021-02-13 LAB — STREP PNEUMONIAE URINARY ANTIGEN: Strep Pneumo Urinary Antigen: NEGATIVE

## 2021-02-13 MED ORDER — PENTAFLUOROPROP-TETRAFLUOROETH EX AERO
1.0000 "application " | INHALATION_SPRAY | CUTANEOUS | Status: DC | PRN
  Filled 2021-02-13: qty 30

## 2021-02-13 MED ORDER — SODIUM CHLORIDE 0.9 % IV SOLN: 100.0000 mL | INTRAVENOUS | Status: DC | PRN

## 2021-02-13 MED ORDER — GUAIFENESIN-DM 100-10 MG/5ML PO SYRP
10.0000 mL | ORAL_SOLUTION | Freq: Four times a day (QID) | ORAL | Status: DC | PRN
  Administered 2021-02-13: 10 mL via ORAL
  Filled 2021-02-13: qty 10

## 2021-02-13 MED ORDER — LIDOCAINE HCL (PF) 1 % IJ SOLN
5.0000 mL | INTRAMUSCULAR | Status: DC | PRN
  Filled 2021-02-13: qty 5

## 2021-02-13 MED ORDER — ALTEPLASE 2 MG IJ SOLR
2.0000 mg | Freq: Once | INTRAMUSCULAR | Status: DC | PRN
  Filled 2021-02-13: qty 2

## 2021-02-13 MED ORDER — HEPARIN SODIUM (PORCINE) 1000 UNIT/ML DIALYSIS
1000.0000 [IU] | INTRAMUSCULAR | Status: DC | PRN
  Filled 2021-02-13: qty 1

## 2021-02-13 MED ORDER — LIDOCAINE-PRILOCAINE 2.5-2.5 % EX CREA
1.0000 "application " | TOPICAL_CREAM | CUTANEOUS | Status: DC | PRN
  Filled 2021-02-13: qty 5

## 2021-02-13 MED ORDER — HYDROCOD POLST-CPM POLST ER 10-8 MG/5ML PO SUER
5.0000 mL | Freq: Two times a day (BID) | ORAL | Status: DC | PRN
  Administered 2021-02-13 – 2021-02-14 (×3): 5 mL via ORAL
  Filled 2021-02-13 (×3): qty 5

## 2021-02-13 NOTE — Progress Notes (Signed)
PROGRESS NOTE    Richard Zavala  OQH:[id number] DOB: 03-Jan-1961 DOA: 02/12/2021 PCP: Midge Minium, PA    Assessment & Plan:   Principal Problem:   Acute respiratory failure with hypoxia [facility]) Active Problems:   Essential hypertension   Type II diabetes mellitus with renal manifestations ([place])   COVID-19 virus infection   Sepsis ([place])   Acute metabolic encephalopathy   Anemia in ESRD (end-stage renal disease) ([place])   HLD (hyperlipidemia)   Stroke ([place])   Hypothermia   ESRD on hemodialysis ([place])   CAP (community acquired pneumonia)   Richard Zavala is a 60 y.o. male with medical history significant of ESRD-HD (MWF), HTN, HLD, DM, stroke, Wilm's tumor, dCHF, CAD, GIB, recent admission due to GI bleed, who presented with AMS.   Acute respiratory failure with hypoxia:  Oxygen desaturated to 87% on room air, which improved to 95% on 2 L oxygen.  Likely multifactorial etiology, including sequela of recent COVID-19 infection, mild interstitial pulmonary edema secondary to ESRD.  Pt was started on empiric abx and solumedrol. --procal 0.57, however, down from prior hospitalization. Plan: --d/c abx, as no strong evidence of bacterial PNA --Continue supplemental O2 to keep sats >=92%, wean as tolerated  Sepsis ruled out --no strong evidence of infection  COVID-19 virus infection:  Patient completed 3-day course of remdesivir in previous admission.  Started on solumedrol on admission Plan: --cont IV solumedrol --cough meds  Essential hypertension:  BP mostly soft. -On home amlodipine, Coreg, Lasix, hydralazine, and Imdur --Hold home BP meds  Type II diabetes mellitus with renal manifestations [facility]):  Recent A1c 5.8, well controlled. -Sliding scale insulin --cont Lantus at reduced 5u daily  Acute metabolic encephalopathy, resolved Likely multifactorial etiology.  CT head is negative for acute intracranial abnormalities.  No focal deficit on physical  examination -Frequent neuro check  ESRD-HD (MWF) -Dr. Candiss Norse is consulted for dialysis  Anemia in ESRD (end-stage renal disease) (Richard Z[facility]ala):  --Hemoglobin stable, 10.7 --Monitor Hgb  HLD (hyperlipidemia) -Lipitor  Stroke (Richard Zavala) -On Lipitor  Hypothermia, resolved   DVT prophylaxis: SCD/Compression stockings Code Status: Full code  Family Communication:  Level of care: Med-Surg Dispo:   The patient is from: home Anticipated d/c is to: home Anticipated d/c date is: 1-2 days Patient currently is not medically ready to d/c due to: covid with new O2 requirement   Subjective and Interval History:  Pt's mental status is back to normal.  Denied dyspnea, denied cough.  Pt wondered why he was brought to the [facility].  No more bloody BM.   Objective: Vitals:   02/13/21 1600 02/13/21 1615 02/13/21 1628 02/13/21 1630  BP: (!) 121/110 (!) 148/71 (!) 115/51 (!) 115/51  Pulse:  69 68 68  Resp:      Temp:      TempSrc:      SpO2: 93% 93% 94% 94%  Weight:      Height:       No intake or output data in the 24 hours ending 02/13/21 1639 Filed Weights   02/12/21 0818 02/13/21 0537  Weight: 72 kg 73.5 kg    Examination:   Constitutional: NAD, AAOx3 HEENT: conjunctivae and lids normal, EOMI CV: No cyanosis.   RESP: No distress, on 2L Extremities: No effusions, edema in BLE SKIN: warm, dry Neuro: II - XII grossly intact.   Psych: Normal mood and affect.  Appropriate judgement and reason   Data Reviewed: I have personally reviewed following labs and imaging studies  CBC: Recent  Labs  Lab 02/07/21 0113 02/07/21 0849 02/07/21 1854 02/08/21 0500 02/09/21 0538 02/12/21 0842 02/13/21 0633  WBC 4.3  --   --  4.3 5.4 4.7 2.9*  NEUTROABS 2.9  --   --   --   --  3.3  --   HGB 10.1*   < > 10.1* 10.7* 10.6* 10.7* 10.2*  HCT 31.6*   < > 31.0* 32.8* 31.9* 33.0* 31.4*  MCV 92.4  --   --  90.6 90.4 90.9 90.8  PLT 106*  --   --  116* 142* 139* 162   < > = values in this  interval not displayed.   Basic Metabolic Panel: Recent Labs  Lab 02/07/21 0113 02/08/21 0500 02/09/21 0538 02/12/21 0842 02/13/21 0633  NA 135 136 135 140 137  K 3.6 3.7 3.7 3.9 4.6  CL 101 98 98 101 101  CO2 21* 25 26 27 24   GLUCOSE 173* 96 88 71 245*  BUN 43* 32* 25* 32* 46*  CREATININE 6.57* 5.48* 4.43* 4.99* 6.21*  CALCIUM 7.7* 7.8* 7.8* 7.8* 7.5*  MG 2.0 2.0 1.9  --   --   PHOS 5.0* 4.5  --   --   --    GFR: Estimated Creatinine Clearance: 12.5 mL/min (A) (by C-G formula based on SCr of 6.21 mg/dL (H)). Liver Function Tests: Recent Labs  Lab 02/07/21 0113 02/12/21 0842  AST 35 72*  ALT 18 33  ALKPHOS 79 266*  BILITOT 1.0 1.7*  PROT 5.9* 6.3*  ALBUMIN 2.5* 2.7*   Recent Labs  Lab 02/12/21 0842  LIPASE 31   Recent Labs  Lab 02/12/21 0842  AMMONIA 14   Coagulation Profile: Recent Labs  Lab 02/06/21 1920 02/07/21 0113  INR 1.3* 1.3*   Cardiac Enzymes: No results for input(s): CKTOTAL, CKMB, CKMBINDEX, TROPONINI in the last 168 hours. BNP (last 3 results) No results for input(s): PROBNP in the last 8760 hours. HbA1C: No results for input(s): HGBA1C in the last 72 hours. CBG: Recent Labs  Lab 02/12/21 1604 02/12/21 1649 02/12/21 2126 02/13/21 0818 02/13/21 1048  GLUCAP 53* 110* 264* 231* 281*   Lipid Profile: No results for input(s): CHOL, HDL, LDLCALC, TRIG, CHOLHDL, LDLDIRECT in the last 72 hours. Thyroid Function Tests: Recent Labs    02/12/21 0842  TSH 1.094  FREET4 1.42*   Anemia Panel: No results for input(s): VITAMINB12, FOLATE, FERRITIN, TIBC, IRON, RETICCTPCT in the last 72 hours. Sepsis Labs: Recent Labs  Lab 02/06/21 1920 02/12/21 0842 02/12/21 1055  PROCALCITON 0.73  --  0.57  LATICACIDVEN  --  0.9  --     Recent Results (from the past 240 hour(s))  Resp Panel by RT-PCR (Flu A&B, Covid) Nasopharyngeal Swab     Status: Abnormal   Collection Time: 02/06/21  3:26 PM   Specimen: Nasopharyngeal Swab; Nasopharyngeal(NP)  swabs in vial transport medium  Result Value Ref Range Status   SARS Coronavirus 2 by RT PCR POSITIVE (A) NEGATIVE Final    Comment: RESULT CALLED TO, READ BACK BY AND VERIFIED WITH: ROBIN REGISTER 02/06/21 1623 KLW (NOTE) SARS-CoV-2 target nucleic acids are DETECTED.  The SARS-CoV-2 RNA is generally detectable in upper respiratory specimens during the acute phase of infection. Positive results are indicative of the presence of the identified virus, but do not rule out bacterial infection or co-infection with other pathogens not detected by the test. Clinical correlation with patient history and other diagnostic information is necessary to determine patient infection status. The expected  result is Negative.  Fact Sheet for Patients: EntrepreneurPulse.com.au  Fact Sheet for Healthcare Providers: IncredibleEmployment.be  This test is not yet approved or cleared by the [place] FDA and  has been authorized for detection and/or diagnosis of SARS-CoV-2 by FDA under an Emergency Use Authorization (EUA).  This EUA will remain in effect (meaning this test can be u sed) for the duration of  the COVID-19 declaration under Section 564(b)(1) of the Act, 21 U.S.C. section 360bbb-3(b)(1), unless the authorization is terminated or revoked sooner.     Influenza A by PCR NEGATIVE NEGATIVE Final   Influenza B by PCR NEGATIVE NEGATIVE Final    Comment: (NOTE) The Xpert Xpress SARS-CoV-2/FLU/RSV plus assay is intended as an aid in the diagnosis of influenza from Nasopharyngeal swab specimens and should not be used as a sole basis for treatment. Nasal washings and aspirates are unacceptable for Xpert Xpress SARS-CoV-2/FLU/RSV testing.  Fact Sheet for Patients: EntrepreneurPulse.com.au  Fact Sheet for Healthcare Providers: IncredibleEmployment.be  This test is not yet approved or cleared by the [place] FDA  and has been authorized for detection and/or diagnosis of SARS-CoV-2 by FDA under an Emergency Use Authorization (EUA). This EUA will remain in effect (meaning this test can be used) for the duration of the COVID-19 declaration under Section 564(b)(1) of the Act, 21 U.S.C. section 360bbb-3(b)(1), unless the authorization is terminated or revoked.  Performed at [facility], [address] [place], [place] [address]   Culture, blood (Routine X 2) w Reflex to ID Panel     Status: None   Collection Time: 02/06/21  7:29 PM   Specimen: BLOOD  Result Value Ref Range Status   Specimen Description BLOOD BLOOD RIGHT HAND  Final   Special Requests   Final    BOTTLES DRAWN AEROBIC AND ANAEROBIC Blood Culture adequate volume   Culture   Final    NO GROWTH 5 DAYS Performed at [facility], [address] [place], [place] [address]    Report Status 02/11/2021 FINAL  Final  Culture, blood (Routine X 2) w Reflex to ID Panel     Status: None   Collection Time: 02/06/21  7:30 PM   Specimen: BLOOD  Result Value Ref Range Status   Specimen Description BLOOD BLOOD LEFT HAND  Final   Special Requests   Final    BOTTLES DRAWN AEROBIC AND ANAEROBIC Blood Culture adequate volume   Culture   Final    NO GROWTH 5 DAYS Performed at [facility], [address] [place], [place] [address]    Report Status 02/11/2021 FINAL  Final  Blood culture (routine single)     Status: None (Preliminary result)   Collection Time: 02/12/21  8:42 AM   Specimen: BLOOD  Result Value Ref Range Status   Specimen Description BLOOD LEFT AC  Final   Special Requests   Final    BOTTLES DRAWN AEROBIC AND ANAEROBIC Blood Culture results may not be optimal due to an excessive volume of blood received in culture bottles   Culture   Final    NO GROWTH < 24 HOURS Performed at [facility], [address] [place], [place] [address]    Report Status PENDING  Incomplete  Urine culture      Status: None   Collection Time: 02/12/21 10:06 AM   Specimen: In/Out Cath Urine  Result Value Ref Range Status   Specimen Description   Final    IN/OUT CATH URINE Performed at [facility], [address], [place], [place] [address]    Special Requests   Final    NONE Performed at [facility], [address] [place], [place] [address]    Culture   Final    NO GROWTH Performed at [facility], [place] [address], [place], [place] [address]    Report Status 02/13/2021 FINAL  Final      Radiology Studies: CT Head Wo Contrast  Result Date: 02/12/2021 CLINICAL DATA:  60 year old male with unexplained altered mental status. History of GI bleeding this month. EXAM: CT HEAD WITHOUT CONTRAST TECHNIQUE: Contiguous axial images were obtained from the base of the skull through the vertex without intravenous contrast. COMPARISON:  Brain MRI 08/21/2016.  Head CT 08/21/2016. FINDINGS: Brain: Mild generalized cerebral volume loss since 2017. No midline shift, ventriculomegaly, mass effect, evidence of mass lesion, intracranial hemorrhage or evidence of cortically based acute infarction. Patchy and confluent chronic hypodensity in the bilateral corona radiata, bilateral deep gray nuclei (greater on the left). Gray-white matter differentiation appears stable since 2017. No definite cortical encephalomalacia. Vascular: Extensive Calcified atherosclerosis at the skull base. No suspicious intracranial vascular hyperdensity. Skull: No acute osseous abnormality identified. Sinuses/Orbits: New left [facility] obstructive pattern of paranasal sinus disease since 2017. Complete opacification of the left frontal, anterior ethmoid and visible left maxillary sinuses. Other sinuses remain well aerated. Tympanic cavities and mastoids remain clear. Other: Generalized scalp vessel calcified atherosclerosis. Chronic postoperative changes to both globes, greater on the left. IMPRESSION: 1. No acute intracranial  abnormality. Stable appearance of advanced chronic small vessel disease since 2017. 2. New left [facility] pattern obstructive paranasal sinus disease since 2017. 3. Advanced calcified atherosclerosis of scalp vessels, suggesting end stage renal disease. Electronically Signed   By: Genevie Ann M.D.   On: 02/12/2021 10:00   DG Chest Portable 1 View  Result Date: 02/12/2021 CLINICAL DATA:  COVID-19 positive. Weakness and altered mental status EXAM: PORTABLE CHEST 1 VIEW COMPARISON:  February 06, 2021 FINDINGS: There is persistent cardiomegaly with pulmonary venous hypertension. Interstitium remains diffusely prominent, likely due to interstitial pulmonary edema. No consolidation or well-defined airspace opacity. No adenopathy. There is calcification in each carotid artery. IMPRESSION: Persistent cardiomegaly with pulmonary vascular congestion. Suspect interstitial edema with a degree of congestive heart failure. It should be noted that atypical organism pneumonia superimposed is possible and may be present concurrently with pulmonary edema. Appearance essentially stable compared to most recent study. Carotid artery calcification noted bilaterally. Electronically Signed   By: Lowella Grip III M.D.   On: 02/12/2021 09:02     Scheduled Meds:  atorvastatin  80 mg Oral QHS   Chlorhexidine Gluconate Cloth  6 each Topical Q0600   insulin aspart  0-9 Units Subcutaneous TID WC   insulin glargine  5 Units Subcutaneous Daily   methylPREDNISolone (SOLU-MEDROL) injection  40 mg Intravenous Q12H   pantoprazole  40 mg Oral BID AC   Continuous Infusions:  sodium chloride     sodium chloride       LOS: 1 day     Enzo Bi, MD [facility] If 7PM-7AM, please contact night-coverage 02/13/2021, 4:39 PM

## 2021-02-13 NOTE — Progress Notes (Signed)
Arrived to unit from 2A in stable condition via bed.

## 2021-02-13 NOTE — Plan of Care (Signed)
  Problem: Education: Goal: Knowledge of risk factors and measures for prevention of condition will improve Outcome: Progressing   Problem: Coping: Goal: Psychosocial and spiritual needs will be supported Outcome: Progressing   Problem: Respiratory: Goal: Will maintain a patent airway Outcome: Progressing Goal: Complications related to the disease process, condition or treatment will be avoided or minimized Outcome: Progressing   

## 2021-02-13 NOTE — Progress Notes (Addendum)
Hemodialysis patient known at [facility] MWF 7:15am, patient was tested postive for COVID 1/17. Cohort clinic is [facility] TTS 12:45pm. Patient will return to normal schedule on 2/28. Please contact me with any dialysis placement concerns.  Elvera Bicker Dialysis Coordinator [phone]

## 2021-02-13 NOTE — Progress Notes (Signed)
[facility]  ROUNDING NOTE   Subjective:   [name] was admitted to [facility] on 02/12/2021 for Delirium [R41.0] Acute respiratory failure with hypoxia ([place]) [J96.01] ESRD on hemodialysis ([place]) [N18.6, Z99.2] Sepsis ([place]) [A41.9] Type 2 diabetes mellitus without complication, with long-term current use of insulin ([place]) [E11.9, Z79.4] Community acquired pneumonia, unspecified laterality [J18.9] COVID-19 virus infection [U07.1]  Patient recently discharged on 02/09/2021  Patient was readmitted for confusion. According to his son, he went to dialysis and came home fine. He had some nausea and vomiting after getting home but went to bed fine. He was confused when he woke the next morning. He was brought in by EMS and admitted for AMS.   Last HD 02/11/21   Patient is seen in bed Alert and oriented x 4 Able to tolerate meals Denies nausea and vomiting Denies chest pain and shortness of breath   Objective:  Vital signs in last 24 hours:  Temp:  [97.7 F (36.5 C)-98.2 F (36.8 C)] 97.9 F (36.6 C) (02/24 1054) Pulse Rate:  [58-68] 68 (02/24 1054) Resp:  [16-20] 20 (02/24 1054) BP: (101-129)/(52-98) 129/79 (02/24 1054) SpO2:  [91 %-100 %] 92 % (02/24 1054) Weight:  [73.5 kg] 73.5 kg (02/24 0537)  Weight change:  Filed Weights   02/12/21 0818 02/13/21 0537  Weight: 72 kg 73.5 kg    Intake/Output: I/O last 3 completed shifts: In: 1351.1 [IV Piggyback:1351.1] Out: -    Intake/Output this shift:  No intake/output data recorded.  Physical Exam: General: NAD,   Head: Normocephalic, atraumatic. Moist oral mucosal   Eyes: Anicteric  Neck: Supple, trachea midline  Lungs:  Clear to auscultation  Heart: Regular rate and rhythm  Abdomen:  Soft, nontender,   Extremities:  no peripheral edema.  Neurologic: Alert, oriented, moving all four extremities  Skin: No lesions  Access: Right AVF    Basic Metabolic Panel: Recent Labs  Lab 02/06/21 1253  02/07/21 0113 02/07/21 0113 02/08/21 0500 02/09/21 0538 02/12/21 0842 02/13/21 0633  NA  --  135  --  136 135 140 137  K  --  3.6  --  3.7 3.7 3.9 4.6  CL  --  101  --  98 98 101 101  CO2  --  21*  --  25 26 27 24   GLUCOSE  --  173*  --  96 88 71 245*  BUN  --  43*  --  32* 25* 32* 46*  CREATININE  --  6.57*  --  5.48* 4.43* 4.99* 6.21*  CALCIUM  --  7.7*   < > 7.8* 7.8* 7.8* 7.5*  MG 2.0 2.0  --  2.0 1.9  --   --   PHOS  --  5.0*  --  4.5  --   --   --    < > = values in this interval not displayed.    Liver Function Tests: Recent Labs  Lab 02/06/21 1248 02/07/21 0113 02/12/21 0842  AST 35 35 72*  ALT 19 18 33  ALKPHOS 79 79 266*  BILITOT 0.8 1.0 1.7*  PROT 5.9* 5.9* 6.3*  ALBUMIN 2.6* 2.5* 2.7*   Recent Labs  Lab 02/12/21 0842  LIPASE 31   Recent Labs  Lab 02/06/21 1249 02/12/21 0842  AMMONIA 15 14    CBC: Recent Labs  Lab 02/07/21 0113 02/07/21 0849 02/07/21 1854 02/08/21 0500 02/09/21 0538 02/12/21 0842 02/13/21 0633  WBC 4.3  --   --  4.3 5.4  4.7 2.9*  NEUTROABS 2.9  --   --   --   --  3.3  --   HGB 10.1*   < > 10.1* 10.7* 10.6* 10.7* 10.2*  HCT 31.6*   < > 31.0* 32.8* 31.9* 33.0* 31.4*  MCV 92.4  --   --  90.6 90.4 90.9 90.8  PLT 106*  --   --  116* 142* 139* 162   < > = values in this interval not displayed.    Cardiac Enzymes: No results for input(s): CKTOTAL, CKMB, CKMBINDEX, TROPONINI in the last 168 hours.  BNP: Invalid input(s): POCBNP  CBG: Recent Labs  Lab 02/12/21 1604 02/12/21 1649 02/12/21 2126 02/13/21 0818 02/13/21 1048  GLUCAP 53* 110* 264* 231* 25*    Microbiology: Results for orders placed or performed during the hospital encounter of 02/12/21  Blood culture (routine single)     Status: None (Preliminary result)   Collection Time: 02/12/21  8:42 AM   Specimen: BLOOD  Result Value Ref Range Status   Specimen Description BLOOD LEFT AC  Final   Special Requests   Final    BOTTLES DRAWN AEROBIC AND ANAEROBIC  Blood Culture results may not be optimal due to an excessive volume of blood received in culture bottles   Culture   Final    NO GROWTH < 24 HOURS Performed at [facility], [address], [place], [place] [address]    Report Status PENDING  Incomplete  Urine culture     Status: None   Collection Time: 02/12/21 10:06 AM   Specimen: In/Out Cath Urine  Result Value Ref Range Status   Specimen Description   Final    IN/OUT CATH URINE Performed at [facility], [address], [place], [place] [address]    Special Requests   Final    NONE Performed at [facility], [address] [place], [place] [address]    Culture   Final    NO GROWTH Performed at [facility], [place] [address], [place], [place] [address]    Report Status 02/13/2021 FINAL  Final    Coagulation Studies: No results for input(s): LABPROT, INR in the last 72 hours.  Urinalysis: Recent Labs    02/12/21 1006  [place] 1.011  PHURINE 8.0  GLUCOSEU NEGATIVE  HGBUR NEGATIVE  BILIRUBINUR NEGATIVE  KETONESUR NEGATIVE  PROTEINUR 100*  NITRITE NEGATIVE  LEUKOCYTESUR NEGATIVE      Imaging: CT Head Wo Contrast  Result Date: 02/12/2021 CLINICAL DATA:  60 year old male with unexplained altered mental status. History of GI bleeding this month. EXAM: CT HEAD WITHOUT CONTRAST TECHNIQUE: Contiguous axial images were obtained from the base of the skull through the vertex without intravenous contrast. COMPARISON:  Brain MRI 08/21/2016.  Head CT 08/21/2016. FINDINGS: Brain: Mild generalized cerebral volume loss since 2017. No midline shift, ventriculomegaly, mass effect, evidence of mass lesion, intracranial hemorrhage or evidence of cortically based acute infarction. Patchy and confluent chronic hypodensity in the bilateral corona radiata, bilateral deep gray nuclei (greater on the left). Gray-white matter differentiation appears stable since 2017. No definite cortical  encephalomalacia. Vascular: Extensive Calcified atherosclerosis at the skull base. No suspicious intracranial vascular hyperdensity. Skull: No acute osseous abnormality identified. Sinuses/Orbits: New left [facility] obstructive pattern of paranasal sinus disease since 2017. Complete opacification of the left frontal, anterior ethmoid and visible left maxillary sinuses. Other sinuses remain well aerated. Tympanic cavities and mastoids remain clear. Other: Generalized scalp vessel calcified atherosclerosis. Chronic  postoperative changes to both globes, greater on the left. IMPRESSION: 1. No acute intracranial abnormality. Stable appearance of advanced chronic small vessel disease since 2017. 2. New left [facility] pattern obstructive paranasal sinus disease since 2017. 3. Advanced calcified atherosclerosis of scalp vessels, suggesting end stage renal disease. Electronically Signed   By: Genevie Ann M.D.   On: 02/12/2021 10:00   DG Chest Portable 1 View  Result Date: 02/12/2021 CLINICAL DATA:  COVID-19 positive. Weakness and altered mental status EXAM: PORTABLE CHEST 1 VIEW COMPARISON:  February 06, 2021 FINDINGS: There is persistent cardiomegaly with pulmonary venous hypertension. Interstitium remains diffusely prominent, likely due to interstitial pulmonary edema. No consolidation or well-defined airspace opacity. No adenopathy. There is calcification in each carotid artery. IMPRESSION: Persistent cardiomegaly with pulmonary vascular congestion. Suspect interstitial edema with a degree of congestive heart failure. It should be noted that atypical organism pneumonia superimposed is possible and may be present concurrently with pulmonary edema. Appearance essentially stable compared to most recent study. Carotid artery calcification noted bilaterally. Electronically Signed   By: Lowella Grip III M.D.   On: 02/12/2021 09:02     Medications:   . sodium chloride    . sodium chloride     . atorvastatin  80 mg Oral QHS  .  Chlorhexidine Gluconate Cloth  6 each Topical Q0600  . insulin aspart  0-9 Units Subcutaneous TID WC  . insulin glargine  5 Units Subcutaneous Daily  . methylPREDNISolone (SOLU-MEDROL) injection  40 mg Intravenous Q12H  . pantoprazole  40 mg Oral BID AC   sodium chloride, sodium chloride, acetaminophen, acetaminophen, albuterol, alteplase, chlorpheniramine-HYDROcodone, guaiFENesin-dextromethorphan, heparin, hydrALAZINE, lidocaine (PF), lidocaine-prilocaine, pentafluoroprop-tetrafluoroeth  Assessment/ Plan:  [name] with end stage renal disease on hemodialysis, CVA, hypertension, pulmonary hypertension, diabetes mellitus type II, diabetic neuropathy, diabetic retinopathy, BPH, coronary artery disease, hyperlipidemia who is admitted to [facility] on 02/12/2021 for Delirium [R41.0] Acute respiratory failure with hypoxia ([place]) [J96.01] ESRD on hemodialysis ([place]) [N18.6, Z99.2] Sepsis ([place]) [A41.9] Type 2 diabetes mellitus without complication, with long-term current use of insulin ([place]) [E11.9, Z79.4] Community acquired pneumonia, unspecified laterality [J18.9] COVID-19 virus infection [U07.1]  [facility] MWF [facility] Right AVF 72kg  1. End Stage Renal Disease: last hemodialysis was 2/22.  -Placed at [facility] clinic for [place] treatment TTS -Scheduled for dialysis treatment today -UF goal of 1-1.5  2. Hypotension: history of hypertension with home regimen of carvedilol, amlodipine, hydralazine, furosemide and isosorbide mononitrate.  - Holding BP medications  3. Anemia with chronic kidney disease:  Current hemoglobin 10.2 EPO as outpatient.  Will continue to monitor  4. Secondary Hyperparathyroidism: with hyperphosphatemia and hypocalcemia. Not currently on binders.    LOS: 1 Shantelle Breeze 2/24/202212:21 PM

## 2021-02-14 DIAGNOSIS — J9601 Acute respiratory failure with hypoxia: Secondary | ICD-10-CM | POA: Diagnosis not present

## 2021-02-14 LAB — CBC
HCT: 30.8 % — ABNORMAL LOW (ref 39.0–52.0)
Hemoglobin: 9.9 g/dL — ABNORMAL LOW (ref 13.0–17.0)
MCH: 29.6 pg (ref 26.0–34.0)
MCHC: 32.1 g/dL (ref 30.0–36.0)
MCV: 91.9 fL (ref 80.0–100.0)
Platelets: 180 10*3/uL (ref 150–400)
RBC: 3.35 MIL/uL — ABNORMAL LOW (ref 4.22–5.81)
RDW: 16 % — ABNORMAL HIGH (ref 11.5–15.5)
WBC: 7.8 10*3/uL (ref 4.0–10.5)
nRBC: 0 % (ref 0.0–0.2)

## 2021-02-14 LAB — BASIC METABOLIC PANEL
Anion gap: 9 (ref 5–15)
BUN: 28 mg/dL — ABNORMAL HIGH (ref 6–20)
CO2: 28 mmol/L (ref 22–32)
Calcium: 7.8 mg/dL — ABNORMAL LOW (ref 8.9–10.3)
Chloride: 100 mmol/L (ref 98–111)
Creatinine, Ser: 4.63 mg/dL — ABNORMAL HIGH (ref 0.61–1.24)
GFR, Estimated: 14 mL/min — ABNORMAL LOW (ref >60)
Glucose, Bld: 195 mg/dL — ABNORMAL HIGH (ref 70–99)
Potassium: 4.7 mmol/L (ref 3.5–5.1)
Sodium: 137 mmol/L (ref 135–145)

## 2021-02-14 LAB — GLUCOSE, CAPILLARY
Glucose-Capillary: 172 mg/dL — ABNORMAL HIGH (ref 70–99)
Glucose-Capillary: 175 mg/dL — ABNORMAL HIGH (ref 70–99)
Glucose-Capillary: 145 mg/dL — ABNORMAL HIGH (ref 70–99)
Glucose-Capillary: 207 mg/dL — ABNORMAL HIGH (ref 70–99)

## 2021-02-14 LAB — LEGIONELLA PNEUMOPHILA SEROGP 1 UR AG: L. pneumophila Serogp 1 Ur Ag: NEGATIVE

## 2021-02-14 LAB — MAGNESIUM: Magnesium: 2.2 mg/dL (ref 1.7–2.4)

## 2021-02-14 LAB — PROCALCITONIN: Procalcitonin: 0.47 ng/mL

## 2021-02-14 NOTE — TOC Initial Note (Signed)
Transition of Care [facility]) - Initial/Assessment Note    Patient Details  Name: Richard Zavala MRN: [id number] Date of Birth: 11-12-1961  Transition of Care [facility]) CM/SW Contact:    Shelbie Hutching, RN Phone Number: 02/14/2021, 3:56 PM  Clinical Narrative:                 Patient admitted to the hospital with pneumonia also previously diagnosed with COVID.  RNCM was able to speak with patient via phone.  Patient lives at home with his son.  Son or daughter provide transportation to appointments and dialysis.  Patient denies any needs for discharge.  Current with PCP and uses [facility] on [address] for prescriptions.  Expected Discharge Plan: Home/Self Care     Patient Goals and CMS Choice        Expected Discharge Plan and Services Expected Discharge Plan: Home/Self Care       Living arrangements for the past 2 months: Single Family Home                 DME Arranged: N/A DME Agency: NA       HH Arranged: Patient Refused [place] Agency: NA        Prior Living Arrangements/Services Living arrangements for the past 2 months: Single Family Home Lives with:: Adult Children Patient language and need for interpreter reviewed:: Yes Do you feel safe going back to the place where you live?: Yes      Need for Family Participation in Patient Care: Yes (Comment) (dialysis patient) Care giver support system in place?: Yes (comment) (children)   Criminal Activity/Legal Involvement Pertinent to Current Situation/Hospitalization: No - Comment as needed  Activities of Daily Living Home Assistive Devices/Equipment: None ADL Screening (condition at time of admission) Patient's cognitive ability adequate to safely complete daily activities?: Yes Is the patient deaf or have difficulty hearing?: No Does the patient have difficulty seeing, even when wearing glasses/contacts?: No Does the patient have difficulty concentrating, remembering, or making decisions?: No Patient able to  express need for assistance with ADLs?: Yes Does the patient have difficulty dressing or bathing?: No Independently performs ADLs?: No Communication: Independent Dressing (OT): Needs assistance Is this a change from baseline?: Pre-admission baseline Grooming: Needs assistance Is this a change from baseline?: Pre-admission baseline Feeding: Independent Bathing: Needs assistance Is this a change from baseline?: Pre-admission baseline Toileting: Needs assistance Is this a change from baseline?: Pre-admission baseline In/Out Bed: Needs assistance Is this a change from baseline?: Pre-admission baseline Walks in Home: Independent with device (comment) Does the patient have difficulty walking or climbing stairs?: Yes Weakness of Legs: Both Weakness of Arms/Hands: None  Permission Sought/Granted   Permission granted to share information with : No              Emotional Assessment   Attitude/Demeanor/Rapport: Engaged Affect (typically observed): Accepting Orientation: : Oriented to Self,Oriented to Place,Oriented to  Time,Oriented to Situation Alcohol / Substance Use: Not Applicable Psych Involvement: No (comment)  Admission diagnosis:  Delirium [R41.0] Acute respiratory failure with hypoxia ([place]) [J96.01] ESRD on hemodialysis ([place]) [N18.6, Z99.2] Sepsis ([place]) [A41.9] Type 2 diabetes mellitus without complication, with long-term current use of insulin ([place]) [E11.9, Z79.4] Community acquired pneumonia, unspecified laterality [J18.9] COVID-19 virus infection [U07.1] Patient Active Problem List   Diagnosis Date Noted  . Sepsis ([place]) 02/12/2021  . Acute metabolic encephalopathy 46/27/0350  . Acute respiratory failure with hypoxia ([place]) 02/12/2021  . Anemia in ESRD (end-stage renal disease) ([place]) 02/12/2021  .  HLD (hyperlipidemia) 02/12/2021  . Stroke ([place]) 02/12/2021  . Hypothermia 02/12/2021  . CAP (community acquired pneumonia) 02/12/2021  . ESRD on hemodialysis ([place])   .  Acute cystitis 02/06/2021  . Acute upper GI bleed 02/06/2021  . Acute blood loss anemia 02/06/2021  . COVID-19 virus infection 02/06/2021  . Gait instability 10/12/2019  . Pulmonary nodule 10/12/2019  . CAD (coronary artery disease), native coronary artery 03/08/2018  . Hyperlipidemia 03/08/2018  . Essential hypertension 03/08/2018  . Wilms' tumor with favorable history 03/01/2018  . Closed fracture of right distal radius 08/22/2016  . TIA (transient ischemic attack) 08/21/2016  . Encounter for pre-transplant evaluation for kidney transplant 05/17/2016  . Anemia due to other cause   . Hyperkalemia   . Hypoglycemia   . Pain   . End stage renal disease ([place])   . Weakness   . Other chest pain   . Elevated troponin   . Hyperkalemia, diminished renal excretion 08/14/2015  . Hypertensive urgency 03/26/2015  . Anemia in CKD (chronic kidney disease) 12/04/2014  . Chronic kidney disease, stage IV (severe) ([place]) 12/03/2014  . Acute ischemic stroke ([place]) 08/25/2014  . Acute lacunar stroke ([employer]) 06/25/2014  . Orthostatic hypotension 06/25/2014  . Diabetic vitreous hemorrhage associated with type 2 diabetes mellitus ([place]) 09/28/2013  . PDR (proliferative diabetic retinopathy) ([place]) 09/28/2013  . History of Wilms' tumor 08/15/2013  . Iron deficiency anemia 07/21/2013  . Mesenteric artery stenosis ([place]) 03/27/2013  . Heart failure, chronic systolic ([place]) 45/36/4680  . Ischemic cardiomyopathy 03/13/2013  . Pulmonary HTN ([place]) 03/13/2013  . Peripheral edema 02/28/2013  . Diabetic macular edema of both eyes ([place]) 01/16/2013  . Corneal epithelial defect 10/06/2012  . TRD (traction retinal detachment) 09/22/2012  . Vitreous hemorrhage of both eyes ([place]) 07/21/2012  . Closed right hip fracture ([place]) 02/19/2012  . Diabetic sensorimotor polyneuropathy ([place]) 01/27/2012  . Vitamin D deficiency disease 11/30/2011  . Type II diabetes mellitus with renal manifestations ([place]) 07/03/2011  . Type 2  diabetes mellitus with kidney complication, with long-term current use of insulin ([place]) 07/03/2011  . Osteoporosis with fracture 05/28/2011   PCP:  Midge Minium, PA Pharmacy:   [facility] [address] ([address]), [place] [address] [place]) [place] [address] Phone: [phone] Fax: [phone]     Social Determinants of Health (SDOH) Interventions    Readmission Risk Interventions Readmission Risk Prevention Plan 02/14/2021 02/09/2021 02/08/2021  Transportation Screening Complete - Complete  PCP or Specialist Appt within 3-5 Days - - Complete  HRI or Home Care Consult - - Patient refused  Social Work Consult for [place] Planning/Counseling - Patient refused Patient refused  Palliative Care Screening - Patient Refused Patient Refused  Palliative Care Screening Not Complete Comments - (No Data) -  Medication Review ([place]) Complete Referral to Pharmacy Referral to Pharmacy  PCP or Specialist appointment within 3-5 days of discharge Complete - -  [place] or [place] Patient refused - -  SW Recovery Care/Counseling Consult Patient refused - -  Palliative Care Screening Not Applicable - -  [place] Not Applicable - -  Some recent data might be hidden

## 2021-02-14 NOTE — Progress Notes (Signed)
[facility]  ROUNDING NOTE   Subjective:   [name] was admitted to [facility] on 02/12/2021 for Delirium [R41.0] Acute respiratory failure with hypoxia ([place]) [J96.01] ESRD on hemodialysis ([place]) [N18.6, Z99.2] Sepsis ([place]) [A41.9] Type 2 diabetes mellitus without complication, with long-term current use of insulin ([place]) [E11.9, Z79.4] Community acquired pneumonia, unspecified laterality [J18.9] COVID-19 virus infection [U07.1]  Patient recently discharged on 02/09/2021  Patient was readmitted for confusion. According to his son, he went to dialysis and came home fine. He had some nausea and vomiting after getting home but went to bed fine. He was confused when he woke the next morning. He was brought in by EMS and admitted for AMS.   Last HD 02/11/21   Patient is seen in bed Continues to be alert and oriented x 4 Able to tolerate meals Denies nausea and vomiting Denies chest pain Shortness of breath with activity    Objective:  Vital signs in last 24 hours:  Temp:  [97.8 F (36.6 C)-98.5 F (36.9 C)] 98.5 F (36.9 C) (02/25 0748) Pulse Rate:  [67-80] 68 (02/25 0748) Resp:  [16-24] 18 (02/25 0748) BP: (100-148)/(44-110) 143/93 (02/25 0748) SpO2:  [91 %-99 %] 95 % (02/25 0748) Weight:  [71.8 kg] 71.8 kg (02/24 2052)  Weight change: -0.2 kg Filed Weights   02/12/21 0818 02/13/21 0537 02/13/21 2052  Weight: 72 kg 73.5 kg 71.8 kg    Intake/Output: I/O last 3 completed shifts: In: 240 [P.O.:240] Out: 2000 [Other:2000]   Intake/Output this shift:  No intake/output data recorded.  Physical Exam: General: NAD, resting in bed  Head: Normocephalic, atraumatic. Moist oral mucosal   Eyes: Anicteric  Neck: Supple, trachea midline  Lungs:  Clear to auscultation, dyspnea on exertion  Heart: Regular rate and rhythm  Abdomen:  Soft, nontender,   Extremities:  no peripheral edema.  Neurologic: Alert, oriented, moving all four extremities  Skin: No  lesions  Access: Right AVF    Basic Metabolic Panel: Recent Labs  Lab 02/08/21 0500 02/09/21 0538 02/12/21 0842 02/13/21 0633 02/14/21 0451  NA 136 135 140 137 137  K 3.7 3.7 3.9 4.6 4.7  CL 98 98 101 101 100  CO2 25 26 27 24 28   GLUCOSE 96 88 71 245* 195*  BUN 32* 25* 32* 46* 28*  CREATININE 5.48* 4.43* 4.99* 6.21* 4.63*  CALCIUM 7.8* 7.8* 7.8* 7.5* 7.8*  MG 2.0 1.9  --   --  2.2  PHOS 4.5  --   --   --   --     Liver Function Tests: Recent Labs  Lab 02/12/21 0842  AST 72*  ALT 33  ALKPHOS 266*  BILITOT 1.7*  PROT 6.3*  ALBUMIN 2.7*   Recent Labs  Lab 02/12/21 0842  LIPASE 31   Recent Labs  Lab 02/12/21 0842  AMMONIA 14    CBC: Recent Labs  Lab 02/08/21 0500 02/09/21 0538 02/12/21 0842 02/13/21 0633 02/14/21 0451  WBC 4.3 5.4 4.7 2.9* 7.8  NEUTROABS  --   --  3.3  --   --   HGB 10.7* 10.6* 10.7* 10.2* 9.9*  HCT 32.8* 31.9* 33.0* 31.4* 30.8*  MCV 90.6 90.4 90.9 90.8 91.9  PLT 116* 142* 139* 162 180    Cardiac Enzymes: No results for input(s): CKTOTAL, CKMB, CKMBINDEX, TROPONINI in the last 168 hours.  BNP: Invalid input(s): POCBNP  CBG: Recent Labs  Lab 02/13/21 0818 02/13/21 1048 02/13/21 1651 02/13/21 2158 02/14/21 0754  GLUCAP 231*  281* 135* 173* 172*    Microbiology: Results for orders placed or performed during the hospital encounter of 02/12/21  Blood culture (routine single)     Status: None (Preliminary result)   Collection Time: 02/12/21  8:42 AM   Specimen: BLOOD  Result Value Ref Range Status   Specimen Description BLOOD LEFT AC  Final   Special Requests   Final    BOTTLES DRAWN AEROBIC AND ANAEROBIC Blood Culture results may not be optimal due to an excessive volume of blood received in culture bottles   Culture   Final    NO GROWTH 2 DAYS Performed at [facility], [address], [place], [place] [address]    Report Status PENDING  Incomplete  Urine culture     Status: None   Collection Time:  02/12/21 10:06 AM   Specimen: In/Out Cath Urine  Result Value Ref Range Status   Specimen Description   Final    IN/OUT CATH URINE Performed at [facility], [address], [place], [place] [address]    Special Requests   Final    NONE Performed at [facility], [address], [place], [place] [address]    Culture   Final    NO GROWTH Performed at [facility], [place] [address] [place], [place] [address]    Report Status 02/13/2021 FINAL  Final    Coagulation Studies: No results for input(s): LABPROT, INR in the last 72 hours.  Urinalysis: Recent Labs    02/12/21 1006  COLORURINE YELLOW*  LABSPEC 1.011  PHURINE 8.0  GLUCOSEU NEGATIVE  HGBUR NEGATIVE  BILIRUBINUR NEGATIVE  KETONESUR NEGATIVE  PROTEINUR 100*  NITRITE NEGATIVE  LEUKOCYTESUR NEGATIVE      Imaging: No results found.   Medications:   . sodium chloride    . sodium chloride     . atorvastatin  80 mg Oral QHS  . Chlorhexidine Gluconate Cloth  6 each Topical Q0600  . insulin aspart  0-9 Units Subcutaneous TID WC  . insulin glargine  5 Units Subcutaneous Daily  . methylPREDNISolone (SOLU-MEDROL) injection  40 mg Intravenous Q12H  . pantoprazole  40 mg Oral BID AC   sodium chloride, sodium chloride, acetaminophen, acetaminophen, albuterol, alteplase, chlorpheniramine-HYDROcodone, guaiFENesin-dextromethorphan, heparin, hydrALAZINE, lidocaine (PF), lidocaine-prilocaine, pentafluoroprop-tetrafluoroeth  Assessment/ Plan:  [name] with end stage renal disease on hemodialysis, CVA, hypertension, pulmonary hypertension, diabetes mellitus type II, diabetic neuropathy, diabetic retinopathy, BPH, coronary artery disease, hyperlipidemia who is admitted to [facility] on 02/12/2021 for Delirium [R41.0] Acute respiratory failure with hypoxia ([place]) [J96.01] ESRD on hemodialysis ([place]) [N18.6, Z99.2] Sepsis ([place]) [A41.9] Type 2 diabetes mellitus without  complication, with long-term current use of insulin ([place]) [E11.9, Z79.4] Community acquired pneumonia, unspecified laterality [J18.9] COVID-19 virus infection [U07.1]  [facility] Right AVF 72kg  1. End Stage Renal Disease:  -Placed at [facility] clinic for Covid cohort treatment TTS -received dialysis yesterday -Removed 2L of fluid Next treatment on Saturday  2. Hypotension: history of hypertension with home regimen of carvedilol, amlodipine, hydralazine, furosemide and isosorbide mononitrate.  -Diastolic elevated - Holding BP medications  3. Anemia with chronic kidney disease:  Current hemoglobin 9.9 EPO as outpatient.  Will continue to monitor  4. Secondary Hyperparathyroidism: with hyperphosphatemia and hypocalcemia. Not currently on binders.    LOS: 2 Shantelle Breeze 2/25/202212:12 PM

## 2021-02-14 NOTE — Progress Notes (Signed)
SATURATION QUALIFICATIONS: (This note is used to comply with regulatory documentation for home oxygen)  Patient Saturations on Room Air at Rest = 87%  Patient Saturations on Room Air while Ambulating = 84%  Patient Saturations on 2 Liters of oxygen while Ambulating = 92%  Please briefly explain why patient needs home oxygen:

## 2021-02-14 NOTE — Progress Notes (Signed)
Pt AOx4, no acute events overnight. Tolerated HD session w/o issue. Ambulated to bathroom with standby assistance and walker. No c/o pain or SOB. Fall/safety precautions in place, rounding performed, needs/concerns addressed during shift.

## 2021-02-14 NOTE — Progress Notes (Signed)
PROGRESS NOTE    Richard Zavala  MGQ:[id number] DOB: [birth year] DOA: 02/12/2021 PCP: Midge Minium, PA    Assessment & Plan:   Principal Problem:   Acute respiratory failure with hypoxia [facility]) Active Problems:   Essential hypertension   Type II diabetes mellitus with renal manifestations ([place])   COVID-19 virus infection   Sepsis ([place])   Acute metabolic encephalopathy   Anemia in ESRD (end-stage renal disease) ([place])   HLD (hyperlipidemia)   Stroke ([place])   Hypothermia   ESRD on hemodialysis ([place])   CAP (community acquired pneumonia)   Richard Zavala is a 60 y.o. male with medical history significant of ESRD-HD (MWF), HTN, HLD, DM, stroke, Wilm's tumor, dCHF, CAD, GIB, recent admission due to GI bleed, who presented with AMS.   Acute respiratory failure with hypoxia:  Oxygen desaturated to 87% on room air, which improved to 95% on 2 L oxygen.  Likely multifactorial etiology, including sequela of recent COVID-19 infection, mild interstitial pulmonary edema secondary to ESRD.  Pt was started on empiric abx and solumedrol. --procal 0.57, however, down from prior hospitalization. --abx d/c'ed, as no strong evidence of bacterial PNA.  Procal trending down without abx Plan: --Continue supplemental O2 to keep sats >=92%, wean as tolerated  Sepsis ruled out --no strong evidence of infection  COVID-19 PNA Patient completed 3-day course of remdesivir in previous admission.  Presented with late effect of covid infection with hypoxia and elevated inflammation. --Started on solumedrol on admission Plan: --cont IV solumedrol 40 q12h --Ensure CRP down-trending --cough meds  Essential hypertension:  BP intermittently soft -On home amlodipine, Coreg, Lasix, hydralazine, and Imdur --hold home BP meds  Type II diabetes mellitus with renal manifestations [facility]):  Recent A1c 5.8, well controlled. -Sliding scale insulin --cont Lantus at reduced 5u daily  Acute metabolic  encephalopathy, resolved Likely multifactorial etiology.  CT head is negative for acute intracranial abnormalities.  No focal deficit on physical examination -Frequent neuro check  ESRD-HD (MWF) -Dr. Candiss Norse is consulted for dialysis  Anemia in ESRD (end-stage renal disease) ([place]):  --Hemoglobin stable, 10.7 --Monitor Hgb  HLD (hyperlipidemia) -Lipitor  Stroke ([place]) -On Lipitor  Hypothermia, resolved   DVT prophylaxis: SCD/Compression stockings Code Status: Full code  Family Communication:  Level of care: Med-Surg Dispo:   The patient is from: home Anticipated d/c is to: home Anticipated d/c date is: 1-2 days Patient currently is not medically ready to d/c due to: covid with new O2 requirement   Subjective and Interval History:  Pt denied dyspnea and wanted to go home, however, pt had O2 desat on room air still requiring 2L [place].     Objective: Vitals:   02/14/21 0046 02/14/21 0534 02/14/21 0748 02/14/21 1224  BP: (!) 100/44 (!) 123/98 (!) 143/93 118/68  Pulse: 69 73 68 70  Resp: 16 18 18 18   Temp: 98.5 F (36.9 C) 98.3 F (36.8 C) 98.5 F (36.9 C) 98.4 F (36.9 C)  TempSrc: Oral Oral    SpO2: 97% 92% 95% 95%  Weight:      Height:        Intake/Output Summary (Last 24 hours) at 02/14/2021 1424 Last data filed at 02/14/2021 0500 Gross per 24 hour  Intake 240 ml  Output 2000 ml  Net -1760 ml   Filed Weights   02/12/21 0818 02/13/21 0537 02/13/21 2052  Weight: 72 kg 73.5 kg 71.8 kg    Examination:   Constitutional: NAD, AAOx3 HEENT: left eye blind, EOMI CV: No  cyanosis.   RESP: No distress on 2L Extremities: No effusions, edema in BLE SKIN: warm, dry Neuro: II - XII grossly intact.   Psych: Normal mood and affect.     Data Reviewed: I have personally reviewed following labs and imaging studies  CBC: Recent Labs  Lab 02/08/21 0500 02/09/21 0538 02/12/21 0842 02/13/21 0633 02/14/21 0451  WBC 4.3 5.4 4.7 2.9* 7.8  NEUTROABS  --   --   3.3  --   --   HGB 10.7* 10.6* 10.7* 10.2* 9.9*  HCT 32.8* 31.9* 33.0* 31.4* 30.8*  MCV 90.6 90.4 90.9 90.8 91.9  PLT 116* 142* 139* 162 381   Basic Metabolic Panel: Recent Labs  Lab 02/08/21 0500 02/09/21 0538 02/12/21 0842 02/13/21 0633 02/14/21 0451  NA 136 135 140 137 137  K 3.7 3.7 3.9 4.6 4.7  CL 98 98 101 101 100  CO2 25 26 27 24 28   GLUCOSE 96 88 71 245* 195*  BUN 32* 25* 32* 46* 28*  CREATININE 5.48* 4.43* 4.99* 6.21* 4.63*  CALCIUM 7.8* 7.8* 7.8* 7.5* 7.8*  MG 2.0 1.9  --   --  2.2  PHOS 4.5  --   --   --   --    GFR: Estimated Creatinine Clearance: 16.8 mL/min (A) (by C-G formula based on SCr of 4.63 mg/dL (H)). Liver Function Tests: Recent Labs  Lab 02/12/21 0842  AST 72*  ALT 33  ALKPHOS 266*  BILITOT 1.7*  PROT 6.3*  ALBUMIN 2.7*   Recent Labs  Lab 02/12/21 0842  LIPASE 31   Recent Labs  Lab 02/12/21 0842  AMMONIA 14   Coagulation Profile: No results for input(s): INR, PROTIME in the last 168 hours. Cardiac Enzymes: No results for input(s): CKTOTAL, CKMB, CKMBINDEX, TROPONINI in the last 168 hours. BNP (last 3 results) No results for input(s): PROBNP in the last 8760 hours. HbA1C: No results for input(s): HGBA1C in the last 72 hours. CBG: Recent Labs  Lab 02/13/21 1048 02/13/21 1651 02/13/21 2158 02/14/21 0754 02/14/21 1224  GLUCAP 281* 135* 173* 172* 175*   Lipid Profile: No results for input(s): CHOL, HDL, LDLCALC, TRIG, CHOLHDL, LDLDIRECT in the last 72 hours. Thyroid Function Tests: Recent Labs    02/12/21 0842  TSH 1.094  FREET4 1.42*   Anemia Panel: No results for input(s): VITAMINB12, FOLATE, FERRITIN, TIBC, IRON, RETICCTPCT in the last 72 hours. Sepsis Labs: Recent Labs  Lab 02/12/21 8299 02/12/21 1055 02/14/21 0451  PROCALCITON  --  0.57 0.47  LATICACIDVEN 0.9  --   --     Recent Results (from the past 240 hour(s))  Resp Panel by RT-PCR (Flu A&B, Covid) Nasopharyngeal Swab     Status: Abnormal   Collection  Time: 02/06/21  3:26 PM   Specimen: Nasopharyngeal Swab; Nasopharyngeal(NP) swabs in vial transport medium  Result Value Ref Range Status   SARS Coronavirus 2 by RT PCR POSITIVE (A) NEGATIVE Final    Comment: RESULT CALLED TO, READ BACK BY AND VERIFIED WITH: ROBIN REGISTER 02/06/21 1623 KLW (NOTE) SARS-CoV-2 target nucleic acids are DETECTED.  The SARS-CoV-2 RNA is generally detectable in upper respiratory specimens during the acute phase of infection. Positive results are indicative of the presence of the identified virus, but do not rule out bacterial infection or co-infection with other pathogens not detected by the test. Clinical correlation with patient history and other diagnostic information is necessary to determine patient infection status. The expected result is Negative.  Fact Sheet for Patients:  EntrepreneurPulse.com.au  Fact Sheet for Healthcare Providers: IncredibleEmployment.be  This test is not yet approved or cleared by the [place] FDA and  has been authorized for detection and/or diagnosis of SARS-CoV-2 by FDA under an Emergency Use Authorization (EUA).  This EUA will remain in effect (meaning this test can be u sed) for the duration of  the COVID-19 declaration under Section 564(b)(1) of the Act, 21 U.S.C. section 360bbb-3(b)(1), unless the authorization is terminated or revoked sooner.     Influenza A by PCR NEGATIVE NEGATIVE Final   Influenza B by PCR NEGATIVE NEGATIVE Final    Comment: (NOTE) The Xpert Xpress SARS-CoV-2/FLU/RSV plus assay is intended as an aid in the diagnosis of influenza from Nasopharyngeal swab specimens and should not be used as a sole basis for treatment. Nasal washings and aspirates are unacceptable for Xpert Xpress SARS-CoV-2/FLU/RSV testing.  Fact Sheet for Patients: EntrepreneurPulse.com.au  Fact Sheet for Healthcare  Providers: IncredibleEmployment.be  This test is not yet approved or cleared by the [place] FDA and has been authorized for detection and/or diagnosis of SARS-CoV-2 by FDA under an Emergency Use Authorization (EUA). This EUA will remain in effect (meaning this test can be used) for the duration of the COVID-19 declaration under Section 564(b)(1) of the Act, 21 U.S.C. section 360bbb-3(b)(1), unless the authorization is terminated or revoked.  Performed at [facility], [address] [place], [place] [address]   Culture, blood (Routine X 2) w Reflex to ID Panel     Status: None   Collection Time: 02/06/21  7:29 PM   Specimen: BLOOD  Result Value Ref Range Status   Specimen Description BLOOD BLOOD RIGHT HAND  Final   Special Requests   Final    BOTTLES DRAWN AEROBIC AND ANAEROBIC Blood Culture adequate volume   Culture   Final    NO GROWTH 5 DAYS Performed at [facility], [address] [place], [place] [address]    Report Status 02/11/2021 FINAL  Final  Culture, blood (Routine X 2) w Reflex to ID Panel     Status: None   Collection Time: 02/06/21  7:30 PM   Specimen: BLOOD  Result Value Ref Range Status   Specimen Description BLOOD BLOOD LEFT HAND  Final   Special Requests   Final    BOTTLES DRAWN AEROBIC AND ANAEROBIC Blood Culture adequate volume   Culture   Final    NO GROWTH 5 DAYS Performed at [facility], [address] [place], [place] [address]    Report Status 02/11/2021 FINAL  Final  Blood culture (routine single)     Status: None (Preliminary result)   Collection Time: 02/12/21  8:42 AM   Specimen: BLOOD  Result Value Ref Range Status   Specimen Description BLOOD LEFT AC  Final   Special Requests   Final    BOTTLES DRAWN AEROBIC AND ANAEROBIC Blood Culture results may not be optimal due to an excessive volume of blood received in culture bottles   Culture   Final    NO GROWTH 2 DAYS Performed at  [facility], [address] [place], [place] [address]    Report Status PENDING  Incomplete  Urine culture     Status: None   Collection Time: 02/12/21 10:06 AM   Specimen: In/Out Cath Urine  Result Value Ref Range Status   Specimen Description   Final    IN/OUT CATH URINE Performed at [facility], [address], [place], [place] [address]  Special Requests   Final    NONE Performed at [facility], [address], [place], [place] [address]    Culture   Final    NO GROWTH Performed at [facility], [place] [address] [place], [place] [address]    Report Status 02/13/2021 FINAL  Final      Radiology Studies: No results found.   Scheduled Meds: . atorvastatin  80 mg Oral QHS  . Chlorhexidine Gluconate Cloth  6 each Topical Q0600  . insulin aspart  0-9 Units Subcutaneous TID WC  . insulin glargine  5 Units Subcutaneous Daily  . methylPREDNISolone (SOLU-MEDROL) injection  40 mg Intravenous Q12H  . pantoprazole  40 mg Oral BID AC   Continuous Infusions: . sodium chloride    . sodium chloride       LOS: 2 days     Enzo Bi, MD [facility] If 7PM-7AM, please contact night-coverage 02/14/2021, 2:24 PM

## 2021-02-15 DIAGNOSIS — J9601 Acute respiratory failure with hypoxia: Secondary | ICD-10-CM | POA: Diagnosis not present

## 2021-02-15 LAB — BASIC METABOLIC PANEL
Anion gap: 14 (ref 5–15)
BUN: 54 mg/dL — ABNORMAL HIGH (ref 6–20)
CO2: 24 mmol/L (ref 22–32)
Calcium: 7.9 mg/dL — ABNORMAL LOW (ref 8.9–10.3)
Chloride: 97 mmol/L — ABNORMAL LOW (ref 98–111)
Creatinine, Ser: 6.39 mg/dL — ABNORMAL HIGH (ref 0.61–1.24)
GFR, Estimated: 9 mL/min — ABNORMAL LOW (ref >60)
Glucose, Bld: 262 mg/dL — ABNORMAL HIGH (ref 70–99)
Potassium: 5.2 mmol/L — ABNORMAL HIGH (ref 3.5–5.1)
Sodium: 135 mmol/L (ref 135–145)

## 2021-02-15 LAB — CBC
HCT: 29.3 % — ABNORMAL LOW (ref 39.0–52.0)
Hemoglobin: 9.3 g/dL — ABNORMAL LOW (ref 13.0–17.0)
MCH: 29.3 pg (ref 26.0–34.0)
MCHC: 31.7 g/dL (ref 30.0–36.0)
MCV: 92.4 fL (ref 80.0–100.0)
Platelets: 196 10*3/uL (ref 150–400)
RBC: 3.17 MIL/uL — ABNORMAL LOW (ref 4.22–5.81)
RDW: 15.8 % — ABNORMAL HIGH (ref 11.5–15.5)
WBC: 5.1 10*3/uL (ref 4.0–10.5)
nRBC: 0 % (ref 0.0–0.2)

## 2021-02-15 LAB — GLUCOSE, CAPILLARY
Glucose-Capillary: 237 mg/dL — ABNORMAL HIGH (ref 70–99)
Glucose-Capillary: 176 mg/dL — ABNORMAL HIGH (ref 70–99)
Glucose-Capillary: 231 mg/dL — ABNORMAL HIGH (ref 70–99)
Glucose-Capillary: 218 mg/dL — ABNORMAL HIGH (ref 70–99)

## 2021-02-15 LAB — MAGNESIUM: Magnesium: 2.1 mg/dL (ref 1.7–2.4)

## 2021-02-15 LAB — C-REACTIVE PROTEIN: CRP: 4 mg/dL — ABNORMAL HIGH (ref <1.0)

## 2021-02-15 MED ORDER — EPOETIN ALFA 10000 UNIT/ML IJ SOLN
8000.0000 [IU] | INTRAMUSCULAR | Status: DC
  Filled 2021-02-15: qty 1

## 2021-02-15 NOTE — Plan of Care (Addendum)
Pt AOx4, remains on 2L [place] with desat to mid-80s on RA. VSS. No c/o pain or SOB. Fall/safety precautions in place, rounding performed. Ambulated to bathroom with standby assistance and walker.   Problem: Education: Goal: Knowledge of risk factors and measures for prevention of condition will improve Outcome: Progressing   Problem: Coping: Goal: Psychosocial and spiritual needs will be supported Outcome: Progressing   Problem: Respiratory: Goal: Will maintain a patent airway Outcome: Progressing Goal: Complications related to the disease process, condition or treatment will be avoided or minimized Outcome: Progressing

## 2021-02-15 NOTE — Progress Notes (Addendum)
PROGRESS NOTE    Richard Zavala  JYN:[id number] DOB: 02-Oct-1961 DOA: 02/12/2021 PCP: Midge Minium, PA    Assessment & Plan:   Principal Problem:   Acute respiratory failure with hypoxia [facility]) Active Problems:   Essential hypertension   Type II diabetes mellitus with renal manifestations ([place])   COVID-19 virus infection   Sepsis ([place])   Acute metabolic encephalopathy   Anemia in ESRD (end-stage renal disease) ([place])   HLD (hyperlipidemia)   Stroke ([place])   Hypothermia   ESRD on hemodialysis ([place])   CAP (community acquired pneumonia)   Richard Zavala is a 60 y.o. male with medical history significant of ESRD-HD (MWF), HTN, HLD, DM, stroke, Wilm's tumor, dCHF, CAD, GIB, recent admission due to GI bleed, who presented with AMS.   Acute respiratory failure with hypoxia:  Oxygen desaturated to 87% on room air, which improved to 95% on 2 L oxygen.  Likely multifactorial etiology, including sequela of recent COVID-19 infection, mild interstitial pulmonary edema secondary to ESRD.  Pt was started on empiric abx and solumedrol. --procal 0.57, however, down from prior hospitalization. --abx d/c'ed, as no strong evidence of bacterial PNA.  Procal trending down without abx Plan: --Continue supplemental O2 to keep sats >=92%, wean as tolerated  Sepsis ruled out --no strong evidence of infection  COVID-19 PNA Patient completed 3-day course of remdesivir in previous admission.  Presented with late effect of covid infection with hypoxia and elevated inflammation. --Started on solumedrol on admission Plan: --cont IV solumedrol --Ensure CRP down-trending --cough meds  Essential hypertension:  BP intermittently soft -On home amlodipine, Coreg, Lasix, hydralazine, and Imdur --Hold home BP meds  Type II diabetes mellitus with renal manifestations [facility]):  Recent A1c 5.8, well controlled. -Sliding scale insulin --cont Lantus 5u daily  Acute metabolic encephalopathy,  resolved Likely multifactorial etiology.  CT head is negative for acute intracranial abnormalities.  No focal deficit on physical examination  ESRD-HD (MWF) -Dr. Candiss Norse is consulted for dialysis --dialysis today  Anemia in ESRD (end-stage renal disease) ([place]):  --Hemoglobin stable, 10.7 --Monitor Hgb  HLD (hyperlipidemia) -Lipitor  Stroke ([place]) -On Lipitor  Hypothermia, resolved  Chronic dCHF   DVT prophylaxis: SCD/Compression stockings Code Status: Full code  Family Communication: son updated on the phone today Level of care: Med-Surg Dispo:   The patient is from: home Anticipated d/c is to: home Anticipated d/c date is: tomorrow Patient currently is not medically ready to d/c due to: covid with new O2 requirement   Subjective and Interval History:  Pt denied dyspnea.  Ate well.  Nursing noted pt desat on room air and needed 2L O2.     Objective: Vitals:   02/15/21 1200 02/15/21 1215 02/15/21 1230 02/15/21 1245  BP: (!) 139/100 (!) 150/80 (!) 142/96 140/73  Pulse: 68 71 70 69  Resp: 18 18 18 18   Temp:      TempSrc:      SpO2: 98% 97% 96% 97%  Weight:      Height:        Intake/Output Summary (Last 24 hours) at 02/15/2021 1539 Last data filed at 02/14/2021 2015 Gross per 24 hour  Intake 118 ml  Output --  Net 118 ml   Filed Weights   02/13/21 0537 02/13/21 2052 02/14/21 2017  Weight: 73.5 kg 71.8 kg 71.3 kg    Examination:   Constitutional: NAD, AAOx3 HEENT: left eye blind CV: No cyanosis.   RESP: normal respiratory effort, on 2L Extremities: No effusions, edema in  BLE SKIN: warm, dry.  Scabs over left shin. Neuro: II - XII grossly intact.   Psych: Normal mood and affect.  Appropriate judgement and reason    Data Reviewed: I have personally reviewed following labs and imaging studies  CBC: Recent Labs  Lab 02/09/21 0538 02/12/21 0842 02/13/21 0633 02/14/21 0451 02/15/21 0550  WBC 5.4 4.7 2.9* 7.8 5.1  NEUTROABS  --  3.3  --   --    --   HGB 10.6* 10.7* 10.2* 9.9* 9.3*  HCT 31.9* 33.0* 31.4* 30.8* 29.3*  MCV 90.4 90.9 90.8 91.9 92.4  PLT 142* 139* 162 180 193   Basic Metabolic Panel: Recent Labs  Lab 02/09/21 0538 02/12/21 0842 02/13/21 0633 02/14/21 0451 02/15/21 0550  NA 135 140 137 137 135  K 3.7 3.9 4.6 4.7 5.2*  CL 98 101 101 100 97*  CO2 26 27 24 28 24   GLUCOSE 88 71 245* 195* 262*  BUN 25* 32* 46* 28* 54*  CREATININE 4.43* 4.99* 6.21* 4.63* 6.39*  CALCIUM 7.8* 7.8* 7.5* 7.8* 7.9*  MG 1.9  --   --  2.2 2.1   GFR: Estimated Creatinine Clearance: 12.2 mL/min (A) (by C-G formula based on SCr of 6.39 mg/dL (H)). Liver Function Tests: Recent Labs  Lab 02/12/21 0842  AST 72*  ALT 33  ALKPHOS 266*  BILITOT 1.7*  PROT 6.3*  ALBUMIN 2.7*   Recent Labs  Lab 02/12/21 0842  LIPASE 31   Recent Labs  Lab 02/12/21 0842  AMMONIA 14   Coagulation Profile: No results for input(s): INR, PROTIME in the last 168 hours. Cardiac Enzymes: No results for input(s): CKTOTAL, CKMB, CKMBINDEX, TROPONINI in the last 168 hours. BNP (last 3 results) No results for input(s): PROBNP in the last 8760 hours. HbA1C: No results for input(s): HGBA1C in the last 72 hours. CBG: Recent Labs  Lab 02/14/21 1224 02/14/21 1635 02/14/21 2018 02/15/21 0814 02/15/21 1319  GLUCAP 175* 145* 207* 237* 176*   Lipid Profile: No results for input(s): CHOL, HDL, LDLCALC, TRIG, CHOLHDL, LDLDIRECT in the last 72 hours. Thyroid Function Tests: No results for input(s): TSH, T4TOTAL, FREET4, T3FREE, THYROIDAB in the last 72 hours. Anemia Panel: No results for input(s): VITAMINB12, FOLATE, FERRITIN, TIBC, IRON, RETICCTPCT in the last 72 hours. Sepsis Labs: Recent Labs  Lab 02/12/21 7902 02/12/21 1055 02/14/21 0451  PROCALCITON  --  0.57 0.47  LATICACIDVEN 0.9  --   --     Recent Results (from the past 240 hour(s))  Resp Panel by RT-PCR (Flu A&B, Covid) Nasopharyngeal Swab     Status: Abnormal   Collection Time:  02/06/21  3:26 PM   Specimen: Nasopharyngeal Swab; Nasopharyngeal(NP) swabs in vial transport medium  Result Value Ref Range Status   SARS Coronavirus 2 by RT PCR POSITIVE (A) NEGATIVE Final    Comment: RESULT CALLED TO, READ BACK BY AND VERIFIED WITH: ROBIN REGISTER 02/06/21 1623 KLW (NOTE) SARS-CoV-2 target nucleic acids are DETECTED.  The SARS-CoV-2 RNA is generally detectable in upper respiratory specimens during the acute phase of infection. Positive results are indicative of the presence of the identified virus, but do not rule out bacterial infection or co-infection with other pathogens not detected by the test. Clinical correlation with patient history and other diagnostic information is necessary to determine patient infection status. The expected result is Negative.  Fact Sheet for Patients: EntrepreneurPulse.com.au  Fact Sheet for Healthcare Providers: IncredibleEmployment.be  This test is not yet approved or cleared by the [place]  States FDA and  has been authorized for detection and/or diagnosis of SARS-CoV-2 by FDA under an Emergency Use Authorization (EUA).  This EUA will remain in effect (meaning this test can be u sed) for the duration of  the COVID-19 declaration under Section 564(b)(1) of the Act, 21 U.S.C. section 360bbb-3(b)(1), unless the authorization is terminated or revoked sooner.     Influenza A by PCR NEGATIVE NEGATIVE Final   Influenza B by PCR NEGATIVE NEGATIVE Final    Comment: (NOTE) The Xpert Xpress SARS-CoV-2/FLU/RSV plus assay is intended as an aid in the diagnosis of influenza from Nasopharyngeal swab specimens and should not be used as a sole basis for treatment. Nasal washings and aspirates are unacceptable for Xpert Xpress SARS-CoV-2/FLU/RSV testing.  Fact Sheet for Patients: EntrepreneurPulse.com.au  Fact Sheet for Healthcare  Providers: IncredibleEmployment.be  This test is not yet approved or cleared by the [place] FDA and has been authorized for detection and/or diagnosis of SARS-CoV-2 by FDA under an Emergency Use Authorization (EUA). This EUA will remain in effect (meaning this test can be used) for the duration of the COVID-19 declaration under Section 564(b)(1) of the Act, 21 U.S.C. section 360bbb-3(b)(1), unless the authorization is terminated or revoked.  Performed at [facility], [address] [place], [place] [address]   Culture, blood (Routine X 2) w Reflex to ID Panel     Status: None   Collection Time: 02/06/21  7:29 PM   Specimen: BLOOD  Result Value Ref Range Status   Specimen Description BLOOD BLOOD RIGHT HAND  Final   Special Requests   Final    BOTTLES DRAWN AEROBIC AND ANAEROBIC Blood Culture adequate volume   Culture   Final    NO GROWTH 5 DAYS Performed at [facility], [address], [place], [place] [address]    Report Status 02/11/2021 FINAL  Final  Culture, blood (Routine X 2) w Reflex to ID Panel     Status: None   Collection Time: 02/06/21  7:30 PM   Specimen: BLOOD  Result Value Ref Range Status   Specimen Description BLOOD BLOOD LEFT HAND  Final   Special Requests   Final    BOTTLES DRAWN AEROBIC AND ANAEROBIC Blood Culture adequate volume   Culture   Final    NO GROWTH 5 DAYS Performed at [facility], [address] [place], [place] [address]    Report Status 02/11/2021 FINAL  Final  Blood culture (routine single)     Status: None (Preliminary result)   Collection Time: 02/12/21  8:42 AM   Specimen: BLOOD  Result Value Ref Range Status   Specimen Description BLOOD LEFT AC  Final   Special Requests   Final    BOTTLES DRAWN AEROBIC AND ANAEROBIC Blood Culture results may not be optimal due to an excessive volume of blood received in culture bottles   Culture   Final    NO GROWTH 3 DAYS Performed at  [facility], [address], [place], [place] [address]    Report Status PENDING  Incomplete  Urine culture     Status: None   Collection Time: 02/12/21 10:06 AM   Specimen: In/Out Cath Urine  Result Value Ref Range Status   Specimen Description   Final    IN/OUT CATH URINE Performed at [facility], [address], [place], [place] [address]    Special Requests   Final    NONE Performed at [facility], [address] [place], [place] [address]    Culture   Final    NO GROWTH Performed at [facility], [place] [address], [place], [place] [address]    Report Status 02/13/2021 FINAL  Final      Radiology Studies: No results found.   Scheduled Meds: . atorvastatin  80 mg Oral QHS  . Chlorhexidine Gluconate Cloth  6 each Topical Q0600  . epoetin (EPOGEN/PROCRIT) injection  8,000 Units Intravenous Q T,Th,Sa-HD  . insulin aspart  0-9 Units Subcutaneous TID WC  . insulin glargine  5 Units Subcutaneous Daily  . methylPREDNISolone (SOLU-MEDROL) injection  40 mg Intravenous Q12H  . pantoprazole  40 mg Oral BID AC   Continuous Infusions: . sodium chloride    . sodium chloride       LOS: 3 days     Enzo Bi, MD [facility] If 7PM-7AM, please contact night-coverage 02/15/2021, 3:39 PM

## 2021-02-15 NOTE — Progress Notes (Signed)
Son Avenir Lozinski called back. I let him know we were trying to reach him as patient slid to floor on knees trying to get to phone.  Patient said he was unhurt and had no injuries on exam. MD, Hydrographic surveyor, and charge nurse were notified.  Son appreciated the call back no further questions or concerns.

## 2021-02-15 NOTE — Progress Notes (Signed)
Patient was found next to bed on knees on floor by RN  Idelle Jo, patient said he was unhurt.  He said he was trying to reach his phone and slipped.  Called so- Tighe Gitto and daughter  Nalin Mazzocco listed as contacts left message on recorder for son and daughters phone kept ringings as busy.

## 2021-02-15 NOTE — Progress Notes (Addendum)
[facility]  ROUNDING NOTE   Subjective:   [name] was admitted to [facility] on 02/12/2021 for Delirium [R41.0] Acute respiratory failure with hypoxia ([place]) [J96.01] ESRD on hemodialysis ([place]) [N18.6, Z99.2] Sepsis ([place]) [A41.9] Type 2 diabetes mellitus without complication, with long-term current use of insulin ([place]) [E11.9, Z79.4] Community acquired pneumonia, unspecified laterality [J18.9] COVID-19 virus infection [U07.1]  Patient recently discharged on 02/09/2021  Patient was readmitted for confusion. According to his son, he went to dialysis and came home fine. He had some nausea and vomiting after getting home but went to bed fine. He was confused when he woke the next morning. He was brought in by EMS and admitted for AMS.   Last HD 02/13/21  Patient was seen today on the first floor.  Patient resting comfortably on the bed Patient continues to be alert and oriented x 4 Patient is able to tolerate meals Patient denies nausea and vomiting, chest pain or shortness of breath     Objective:  Vital signs in last 24 hours:  Temp:  [97.8 F (36.6 C)-98.4 F (36.9 C)] 98.2 F (36.8 C) (02/26 1000) Pulse Rate:  [67-75] 69 (02/26 1245) Resp:  [16-18] 18 (02/26 1245) BP: (93-150)/(49-100) 140/73 (02/26 1245) SpO2:  [93 %-99 %] 97 % (02/26 1245) Weight:  [71.3 kg] 71.3 kg (02/25 2017)  Weight change: -0.5 kg Filed Weights   02/13/21 0537 02/13/21 2052 02/14/21 2017  Weight: 73.5 kg 71.8 kg 71.3 kg    Intake/Output: I/O last 3 completed shifts: In: 358 [P.O.:358] Out: -    Intake/Output this shift:  No intake/output data recorded.  Physical Exam: General: NAD, resting in bed  Head: Normocephalic, atraumatic. Moist oral mucosal   Eyes: Anicteric  Neck: Supple, trachea midline  Lungs:  Clear to auscultation, dyspnea on exertion  Heart: Regular rate and rhythm  Abdomen:  Soft, nontender,   Extremities:  no peripheral edema.  Neurologic: Alert,  oriented, moving all four extremities  Skin: No lesions  Access: Right AVF    Basic Metabolic Panel: Recent Labs  Lab 02/09/21 0538 02/12/21 0842 02/13/21 0633 02/14/21 0451 02/15/21 0550  NA 135 140 137 137 135  K 3.7 3.9 4.6 4.7 5.2*  CL 98 101 101 100 97*  CO2 26 27 24 28 24   GLUCOSE 88 71 245* 195* 262*  BUN 25* 32* 46* 28* 54*  CREATININE 4.43* 4.99* 6.21* 4.63* 6.39*  CALCIUM 7.8* 7.8* 7.5* 7.8* 7.9*  MG 1.9  --   --  2.2 2.1    Liver Function Tests: Recent Labs  Lab 02/12/21 0842  AST 72*  ALT 33  ALKPHOS 266*  BILITOT 1.7*  PROT 6.3*  ALBUMIN 2.7*   Recent Labs  Lab 02/12/21 0842  LIPASE 31   Recent Labs  Lab 02/12/21 0842  AMMONIA 14    CBC: Recent Labs  Lab 02/09/21 0538 02/12/21 0842 02/13/21 0633 02/14/21 0451 02/15/21 0550  WBC 5.4 4.7 2.9* 7.8 5.1  NEUTROABS  --  3.3  --   --   --   HGB 10.6* 10.7* 10.2* 9.9* 9.3*  HCT 31.9* 33.0* 31.4* 30.8* 29.3*  MCV 90.4 90.9 90.8 91.9 92.4  PLT 142* 139* 162 180 196    Cardiac Enzymes: No results for input(s): CKTOTAL, CKMB, CKMBINDEX, TROPONINI in the last 168 hours.  BNP: Invalid input(s): POCBNP  CBG: Recent Labs  Lab 02/14/21 1224 02/14/21 1635 02/14/21 2018 02/15/21 0814 02/15/21 1319  GLUCAP 175* 145* 207* 237*  60*    Microbiology: Results for orders placed or performed during the hospital encounter of 02/12/21  Blood culture (routine single)     Status: None (Preliminary result)   Collection Time: 02/12/21  8:42 AM   Specimen: BLOOD  Result Value Ref Range Status   Specimen Description BLOOD LEFT AC  Final   Special Requests   Final    BOTTLES DRAWN AEROBIC AND ANAEROBIC Blood Culture results may not be optimal due to an excessive volume of blood received in culture bottles   Culture   Final    NO GROWTH 3 DAYS Performed at [facility], [address] [place], [place] [address]    Report Status PENDING  Incomplete  Urine culture     Status: None    Collection Time: 02/12/21 10:06 AM   Specimen: In/Out Cath Urine  Result Value Ref Range Status   Specimen Description   Final    IN/OUT CATH URINE Performed at [facility], [address], [place], [place] [address]    Special Requests   Final    NONE Performed at [facility], [address], [place], [place] [address]    Culture   Final    NO GROWTH Performed at [facility], [place] [address], [place], [place] [address]    Report Status 02/13/2021 FINAL  Final    Coagulation Studies: No results for input(s): LABPROT, INR in the last 72 hours.  Urinalysis: No results for input(s): COLORURINE, LABSPEC, PHURINE, GLUCOSEU, HGBUR, BILIRUBINUR, KETONESUR, PROTEINUR, UROBILINOGEN, NITRITE, LEUKOCYTESUR in the last 72 hours.  Invalid input(s): APPERANCEUR    Imaging: No results found.   Medications:   . sodium chloride    . sodium chloride     . atorvastatin  80 mg Oral QHS  . Chlorhexidine Gluconate Cloth  6 each Topical Q0600  . epoetin (EPOGEN/PROCRIT) injection  8,000 Units Intravenous Q T,Th,Sa-HD  . insulin aspart  0-9 Units Subcutaneous TID WC  . insulin glargine  5 Units Subcutaneous Daily  . methylPREDNISolone (SOLU-MEDROL) injection  40 mg Intravenous Q12H  . pantoprazole  40 mg Oral BID AC   sodium chloride, sodium chloride, acetaminophen, acetaminophen, albuterol, alteplase, chlorpheniramine-HYDROcodone, guaiFENesin-dextromethorphan, heparin, hydrALAZINE, lidocaine (PF), lidocaine-prilocaine, pentafluoroprop-tetrafluoroeth  Assessment/ Plan:  [name] with end stage renal disease on hemodialysis, CVA, hypertension, pulmonary hypertension, diabetes mellitus type II, diabetic neuropathy, diabetic retinopathy, BPH, coronary artery disease, hyperlipidemia who is admitted to [facility] on 02/12/2021 for Delirium [R41.0] Acute respiratory failure with hypoxia ([place]) [J96.01] ESRD on hemodialysis ([place]) [N18.6,  Z99.2] Sepsis ([place]) [A41.9] Type 2 diabetes mellitus without complication, with long-term current use of insulin ([place]) [E11.9, Z79.4] Community acquired pneumonia, unspecified laterality [J18.9] COVID-19 virus infection [U07.1]  [facility] Right AVF 72kg   Impression    1)Renal    ESRD Patient is on hemodialysis As an outpatient patient is on Monday Wednesday Friday schedule Patient is on Tuesday Thursday Saturday schedule secondary to his Covid status Patient will be dialyzed today  2) hypotension Patient was hypotensive earlier, patient antihypertensive medications were held Blood pressure is stable    3)Anemia of chronic disease  CBC Latest Ref Rng & Units 02/15/2021 02/14/2021 02/13/2021  WBC 4.0 - 10.5 K/uL 5.1 7.8 2.9(L)  Hemoglobin 13.0 - 17.0 g/dL 9.3(L) 9.9(L) 10.2(L)  Hematocrit 39.0 - 52.0 % 29.3(L) 30.8(L) 31.4(L)  Platelets 150 - 400 K/uL 196 180 162  HGb at goal (9--11)  Patient is on Epogen protocol   4) Secondary hyperparathyroidism -CKD Mineral-Bone Disorder    Lab Results  Component Value Date   CALCIUM 7.9 (L) 02/15/2021   CAION 1.12 (L) 11/23/2019   PHOS 4.5 02/08/2021    Secondary Hyperparathyroidism present As an outpatient patient had hyperparathyroidism with PTH being 168 in the past Phosphorus is now at goal. Patient earlier had hyperphosphatemia  5) COVID-19 Primary team is following  6) Electrolytes   BMP Latest Ref Rng & Units 02/15/2021 02/14/2021 02/13/2021  Glucose 70 - 99 mg/dL 262(H) 195(H) 245(H)  BUN 6 - 20 mg/dL 54(H) 28(H) 46(H)  Creatinine 0.61 - 1.24 mg/dL 6.39(H) 4.63(H) 6.21(H)  Sodium 135 - 145 mmol/L 135 137 137  Potassium 3.5 - 5.1 mmol/L 5.2(H) 4.7 4.6  Chloride 98 - 111 mmol/L 97(L) 100 101  CO2 22 - 32 mmol/L 24 28 24   Calcium 8.9 - 10.3 mg/dL 7.9(L) 7.8(L) 7.5(L)     Sodium Normonatremic   Potassium Hyperkalemia We will dialyze today    7)Acid base    Co2 at  goal    Plan   We will dialyze today We will keep patient on Epogen     LOS: 3 Morley Gaumer s [place] [facility] 2/26/20222:04 PM

## 2021-02-15 NOTE — Progress Notes (Signed)
Treatment started and completed without any issues or concerns. V/S remained stable throughout. Nasal cannula remains intact with continued productive cough and oxygen saturations >95%. Patient voiced no c/o any when asked. 16G needle utilized during this treatment with no issues with access or deaccess.  Total UF of 2011ml obtained x 2.5hrs.

## 2021-02-16 LAB — BASIC METABOLIC PANEL
Anion gap: 12 (ref 5–15)
BUN: 50 mg/dL — ABNORMAL HIGH (ref 6–20)
CO2: 26 mmol/L (ref 22–32)
Calcium: 7.9 mg/dL — ABNORMAL LOW (ref 8.9–10.3)
Chloride: 96 mmol/L — ABNORMAL LOW (ref 98–111)
Creatinine, Ser: 5.32 mg/dL — ABNORMAL HIGH (ref 0.61–1.24)
GFR, Estimated: 12 mL/min — ABNORMAL LOW (ref >60)
Glucose, Bld: 260 mg/dL — ABNORMAL HIGH (ref 70–99)
Potassium: 4.7 mmol/L (ref 3.5–5.1)
Sodium: 134 mmol/L — ABNORMAL LOW (ref 135–145)

## 2021-02-16 LAB — CBC
HCT: 28.9 % — ABNORMAL LOW (ref 39.0–52.0)
Hemoglobin: 9.3 g/dL — ABNORMAL LOW (ref 13.0–17.0)
MCH: 29.5 pg (ref 26.0–34.0)
MCHC: 32.2 g/dL (ref 30.0–36.0)
MCV: 91.7 fL (ref 80.0–100.0)
Platelets: 179 10*3/uL (ref 150–400)
RBC: 3.15 MIL/uL — ABNORMAL LOW (ref 4.22–5.81)
RDW: 15.8 % — ABNORMAL HIGH (ref 11.5–15.5)
WBC: 5.6 10*3/uL (ref 4.0–10.5)
nRBC: 0 % (ref 0.0–0.2)

## 2021-02-16 LAB — MAGNESIUM: Magnesium: 1.9 mg/dL (ref 1.7–2.4)

## 2021-02-16 LAB — C-REACTIVE PROTEIN: CRP: 2.9 mg/dL — ABNORMAL HIGH (ref <1.0)

## 2021-02-16 LAB — GLUCOSE, CAPILLARY: Glucose-Capillary: 265 mg/dL — ABNORMAL HIGH (ref 70–99)

## 2021-02-16 MED ORDER — AMLODIPINE BESYLATE 10 MG PO TABS: ORAL_TABLET | ORAL | 0 refills | Status: DC

## 2021-02-16 MED ORDER — CARVEDILOL 12.5 MG PO TABS: 12.5000 mg | ORAL_TABLET | Freq: Two times a day (BID) | ORAL | Status: AC

## 2021-02-16 MED ORDER — FUROSEMIDE 40 MG PO TABS: ORAL_TABLET | ORAL | Status: DC

## 2021-02-16 MED ORDER — HYDRALAZINE HCL 100 MG PO TABS: ORAL_TABLET | ORAL | Status: DC

## 2021-02-16 MED ORDER — DEXAMETHASONE 6 MG PO TABS: 6.0000 mg | ORAL_TABLET | Freq: Every day | ORAL | 0 refills | Status: AC

## 2021-02-16 MED ORDER — EPOETIN ALFA 10000 UNIT/ML IJ SOLN: 8000.0000 [IU] | INTRAMUSCULAR | Status: AC

## 2021-02-16 MED ORDER — GUAIFENESIN-DM 100-10 MG/5ML PO SYRP: 10.0000 mL | ORAL_SOLUTION | Freq: Four times a day (QID) | ORAL | 0 refills | Status: DC | PRN

## 2021-02-16 MED ORDER — ISOSORBIDE MONONITRATE ER 30 MG PO TB24: ORAL_TABLET | ORAL | 0 refills | Status: DC

## 2021-02-16 NOTE — Progress Notes (Signed)
[facility]  ROUNDING NOTE   Subjective:   [name] was admitted to [facility] on 02/12/2021 for Delirium [R41.0] Acute respiratory failure with hypoxia ([place]) [J96.01] ESRD on hemodialysis ([place]) [N18.6, Z99.2] Sepsis ([place]) [A41.9] Type 2 diabetes mellitus without complication, with long-term current use of insulin ([place]) [E11.9, Z79.4] Community acquired pneumonia, unspecified laterality [J18.9] COVID-19 virus infection [U07.1]  Patient recently discharged on 02/09/2021  Patient was readmitted for confusion. According to his son, he went to dialysis and came home fine. He had some nausea and vomiting after getting home but went to bed fine. He was confused when he woke the next morning. He was brought in by EMS and admitted for AMS.   Last HD 02/15/21  Patient was seen today on the first floor.  Patient resting comfortably on the bed Patient voices no new specific physical concerns     Objective:  Vital signs in last 24 hours:  Temp:  [97.8 F (36.6 C)-98.6 F (37 C)] 97.8 F (36.6 C) (02/27 1101) Pulse Rate:  [66-87] 68 (02/27 1101) Resp:  [15-20] 18 (02/27 1101) BP: (109-150)/(51-100) 121/94 (02/27 1101) SpO2:  [96 %-99 %] 96 % (02/27 1101)  Weight change:  Filed Weights   02/13/21 0537 02/13/21 2052 02/14/21 2017  Weight: 73.5 kg 71.8 kg 71.3 kg    Intake/Output: I/O last 3 completed shifts: In: 118 [P.O.:118] Out: -    Intake/Output this shift:  No intake/output data recorded.  Physical Exam: General: NAD, resting in bed  Head: Normocephalic, atraumatic. Moist oral mucosal   Eyes: Anicteric  Neck: Supple, trachea midline  Lungs:  Clear to auscultation, dyspnea on exertion  Heart: Regular rate and rhythm  Abdomen:  Soft, nontender,   Extremities:  no peripheral edema.  Neurologic: Alert, oriented, moving all four extremities  Skin: No lesions  Access: Right AVF    Basic Metabolic Panel: Recent Labs  Lab 02/12/21 0842  02/13/21 0633 02/14/21 0451 02/15/21 0550 02/16/21 0444  NA 140 137 137 135 134*  K 3.9 4.6 4.7 5.2* 4.7  CL 101 101 100 97* 96*  CO2 27 24 28 24 26   GLUCOSE 71 245* 195* 262* 260*  BUN 32* 46* 28* 54* 50*  CREATININE 4.99* 6.21* 4.63* 6.39* 5.32*  CALCIUM 7.8* 7.5* 7.8* 7.9* 7.9*  MG  --   --  2.2 2.1 1.9    Liver Function Tests: Recent Labs  Lab 02/12/21 0842  AST 72*  ALT 33  ALKPHOS 266*  BILITOT 1.7*  PROT 6.3*  ALBUMIN 2.7*   Recent Labs  Lab 02/12/21 0842  LIPASE 31   Recent Labs  Lab 02/12/21 0842  AMMONIA 14    CBC: Recent Labs  Lab 02/12/21 0842 02/13/21 0633 02/14/21 0451 02/15/21 0550 02/16/21 0444  WBC 4.7 2.9* 7.8 5.1 5.6  NEUTROABS 3.3  --   --   --   --   HGB 10.7* 10.2* 9.9* 9.3* 9.3*  HCT 33.0* 31.4* 30.8* 29.3* 28.9*  MCV 90.9 90.8 91.9 92.4 91.7  PLT 139* 162 180 196 179    Cardiac Enzymes: No results for input(s): CKTOTAL, CKMB, CKMBINDEX, TROPONINI in the last 168 hours.  BNP: Invalid input(s): POCBNP  CBG: Recent Labs  Lab 02/15/21 0814 02/15/21 1319 02/15/21 1805 02/15/21 2015 02/16/21 0838  GLUCAP 237* 176* 231* 218* 265*    Microbiology: Results for orders placed or performed during the hospital encounter of 02/12/21  Blood culture (routine single)     Status: None (  Preliminary result)   Collection Time: 02/12/21  8:42 AM   Specimen: BLOOD  Result Value Ref Range Status   Specimen Description BLOOD LEFT AC  Final   Special Requests   Final    BOTTLES DRAWN AEROBIC AND ANAEROBIC Blood Culture results may not be optimal due to an excessive volume of blood received in culture bottles   Culture   Final    NO GROWTH 4 DAYS Performed at [facility], [address], [place], [place] [address]    Report Status PENDING  Incomplete  Urine culture     Status: None   Collection Time: 02/12/21 10:06 AM   Specimen: In/Out Cath Urine  Result Value Ref Range Status   Specimen Description   Final    IN/OUT  CATH URINE Performed at [facility], [address]  [place], [place] [address]    Special Requests   Final    NONE Performed at [facility], [address] [place], [place] [address]    Culture   Final    NO GROWTH Performed at [facility], [place] [address] [place], [place] [address]    Report Status 02/13/2021 FINAL  Final    Coagulation Studies: No results for input(s): LABPROT, INR in the last 72 hours.  Urinalysis: No results for input(s): COLORURINE, LABSPEC, PHURINE, GLUCOSEU, HGBUR, BILIRUBINUR, KETONESUR, PROTEINUR, UROBILINOGEN, NITRITE, LEUKOCYTESUR in the last 72 hours.  Invalid input(s): APPERANCEUR    Imaging: No results found.   Medications:   . sodium chloride    . sodium chloride     . atorvastatin  80 mg Oral QHS  . Chlorhexidine Gluconate Cloth  6 each Topical Q0600  . epoetin (EPOGEN/PROCRIT) injection  8,000 Units Intravenous Q T,Th,Sa-HD  . insulin aspart  0-9 Units Subcutaneous TID WC  . insulin glargine  5 Units Subcutaneous Daily  . methylPREDNISolone (SOLU-MEDROL) injection  40 mg Intravenous Q12H  . pantoprazole  40 mg Oral BID AC   sodium chloride, sodium chloride, acetaminophen, acetaminophen, albuterol, alteplase, chlorpheniramine-HYDROcodone, guaiFENesin-dextromethorphan, heparin, hydrALAZINE, lidocaine (PF), lidocaine-prilocaine, pentafluoroprop-tetrafluoroeth  Assessment/ Plan:  [name] with end stage renal disease on hemodialysis, CVA, hypertension, pulmonary hypertension, diabetes mellitus type II, diabetic neuropathy, diabetic retinopathy, BPH, coronary artery disease, hyperlipidemia who is admitted to [facility] on 02/12/2021 for Delirium [R41.0] Acute respiratory failure with hypoxia ([place]) [J96.01] ESRD on hemodialysis ([place]) [N18.6, Z99.2] Sepsis ([place]) [A41.9] Type 2 diabetes mellitus without complication, with long-term current use of insulin ([place]) [E11.9, Z79.4] Community  acquired pneumonia, unspecified laterality [J18.9] COVID-19 virus infection [U07.1]  [facility] Right AVF 72kg   Impression    1)Renal    ESRD Patient is on hemodialysis As an outpatient patient is on Monday Wednesday Friday schedule Patient is on Tuesday Thursday Saturday schedule secondary to his Covid status Patient was last dialyzed was on Friday No need for renal replacement therapy today  2) hypotension Patient was hypotensive earlier, patient antihypertensive medications were held Blood pressure is stable    3)Anemia of chronic disease  CBC Latest Ref Rng & Units 02/16/2021 02/15/2021 02/14/2021  WBC 4.0 - 10.5 K/uL 5.6 5.1 7.8  Hemoglobin 13.0 - 17.0 g/dL 9.3(L) 9.3(L) 9.9(L)  Hematocrit 39.0 - 52.0 % 28.9(L) 29.3(L) 30.8(L)  Platelets 150 - 400 K/uL 179 196 180       HGb at goal (9--11)  Patient is on Epogen protocol   4) Secondary hyperparathyroidism -CKD Mineral-Bone Disorder  Lab Results  Component Value Date   CALCIUM 7.9 (L) 02/16/2021   CAION 1.12 (L) 11/23/2019   PHOS 4.5 02/08/2021    Secondary Hyperparathyroidism present As an outpatient patient had hyperparathyroidism with PTH being 168 in the past Phosphorus is now at goal. Patient earlier had hyperphosphatemia  5) COVID-19 Primary team is following  6) Electrolytes   BMP Latest Ref Rng & Units 02/16/2021 02/15/2021 02/14/2021  Glucose 70 - 99 mg/dL 260(H) 262(H) 195(H)  BUN 6 - 20 mg/dL 50(H) 54(H) 28(H)  Creatinine 0.61 - 1.24 mg/dL 5.32(H) 6.39(H) 4.63(H)  Sodium 135 - 145 mmol/L 134(L) 135 137  Potassium 3.5 - 5.1 mmol/L 4.7 5.2(H) 4.7  Chloride 98 - 111 mmol/L 96(L) 97(L) 100  CO2 22 - 32 mmol/L 26 24 28   Calcium 8.9 - 10.3 mg/dL 7.9(L) 7.9(L) 7.8(L)     Sodium Hyponatremic Secondary to ESRD Inability to get rid of  free water Patient glucose also running on the higher side   Potassium Patient was hyperkalemic yesterday Patient was dialyzed  yesterday Patient potassium is now better  We will dialyze today    7)Acid base  Co2 at goal    Plan  No need for renal placement therapy today     LOS: 4 Manpreet s [facility] 2/27/202211:13 AM

## 2021-02-16 NOTE — Discharge Summary (Signed)
Physician Discharge Summary   Richard Zavala  male DOB: 1961/11/10  TMH:[id number]  PCP: Midge Minium, PA  Admit date: 02/12/2021 Discharge date: 02/16/2021  Admitted From: home Disposition:  Home Son updated on the phone about discharge plans prior to discharge.  CODE STATUS: Full code  Discharge Instructions    Discharge instructions   Complete by: As directed    You have COVID infection, but does not require extra oxygen anymore, so you can go home to recover.    Please take 10 more days of steroid Decadron to help reduce inflammation caused by COVID infection.  Your blood pressure has been low normal without any of your home blood pressure medications.  Please continue to hold them (except for Coreg) until followup with your outpatient doctor.   Dr. Enzo Bi - -   No wound care   Complete by: As directed       30 Day Unplanned Readmission Risk Score   Flowsheet Row ED to Hosp-Admission (Current) from 02/12/2021 in [place] (1C)  30 Day Unplanned Readmission Risk Score (%) 31.46 Filed at 02/16/2021 0801     This score is the patient's risk of an unplanned readmission within 30 days of being discharged (0 -100%). The score is based on dignosis, age, lab data, medications, orders, and past utilization.   Low:  0-14.9   Medium: 15-21.9   High: 22-29.9   Extreme: 30 and above         Hospital Course:  For full details, please see H&P, progress notes, consult notes and ancillary notes.  Briefly,  Richard Zavala a 60 y.o.malewith medical history significant ofESRD-HD (MWF), HTN, HLD, DM, stroke, Wilm's tumor, dCHF, CAD, GIB, recent admission due to GI bleed, who presented with AMS.   Acute respiratory failure with hypoxia: Oxygen desaturated to 87% on room air, which improved to 95% on 2 L oxygen. Likely multifactorial etiology, including sequela of recent COVID-19 infection, mild interstitial pulmonary edema  secondary to ESRD.  Pt was started on empiric abx and solumedrol. --procal 0.57, however, down from prior hospitalization, so abx d/c'ed, as no strong evidence of bacterial PNA.  Procal trending down without abx.  Prior to discharge, pt was weaned down to room air.  Sepsisruled out --no strong evidence of infection  COVID-19 PNA Patient completed 3-day course of remdesivir in previous admission.  Presented with late effect of covid infection with hypoxia and elevated inflammation.  Pt received solumedrol with CRP down trending.  Pt was discharged on 10 more days of decadron 6 mg daily.  Prior to discharge, pt was weaned down to room air.  Essential hypertension:  BP intermittently soft.   -On home amlodipine, Coreg, Lasix, hydralazine, and Imdur.  At discharge, all home BP meds held except for Coreg.  Type II diabetes mellitus with renal manifestations (Richard Zavala):  Hyperglycemia due to steroid use Recent A1c 5.8, well controlled. Pt received Lantus 5u daily and SSI while inpatient.  Pt was discharged on home diabetic regimen.  Acute metabolic encephalopathy, resolved Likely multifactorial etiology. CT head is negative for acute intracranial abnormalities. No focal deficit on physical examination.  Mental status back to baseline the day after admission.  ESRD-HD (MWF) -Dr.Singh consulted for dialysis and requested extra fluid removal.  Anemia in ESRD (end-stage renal disease) (Richard Zavala): --Hemoglobin stable, 10.7  HLD (hyperlipidemia) -Lipitor  Stroke (Richard Zavala) -On Lipitor  Hypothermia, resolved  Chronic dCHF   Discharge Diagnoses:  Principal Problem:   Acute  respiratory failure with hypoxia (HCC) Active Problems:   Essential hypertension   Type II diabetes mellitus with renal manifestations (Richard Zavala)   COVID-19 virus infection   Sepsis (Richard Zavala)   Acute metabolic encephalopathy   Anemia in ESRD (end-stage renal disease) (Richard Zavala)   HLD (hyperlipidemia)   Stroke (Richard Zavala)    Hypothermia   ESRD on hemodialysis (Richard Zavala)   CAP (community acquired pneumonia)    Discharge Instructions:  Allergies as of 02/16/2021      Reactions   Metformin Other (See Comments)   Severe diarrhea    Penicillins Hives, Other (See Comments)   Has patient had a PCN reaction causing immediate rash, facial/tongue/throat swelling, SOB or lightheadedness with hypotension:Yes Has patient had a PCN reaction causing severe rash involving mucus membranes or skin necrosis:Yes Has patient had a PCN reaction that required hospitalization:inpatient at the time of reaction. Has patient had a PCN reaction occurring within the last 10 years:No If all of the above answers are "NO", then may proceed with Cephalosporin use.      Medication List    TAKE these medications   amLODipine 10 MG tablet Commonly known as: NORVASC Hold until followup with outpatient doctor due to intermittent low blood pressure.   atorvastatin 80 MG tablet Commonly known as: Lipitor Take 1 tablet (80 mg total) by mouth at bedtime.   carvedilol 12.5 MG tablet Commonly known as: COREG Take 1 tablet (12.5 mg total) by mouth 2 (two) times daily. What changed: how much to take   dexamethasone 6 MG tablet Commonly known as: Decadron Take 1 tablet (6 mg total) by mouth daily for 10 days. Steroid to help reduce inflammation caused by COVID infection.   epoetin alfa 10000 UNIT/ML injection Commonly known as: EPOGEN Inject 0.8 mLs (8,000 Units total) into the vein Every Tuesday,Thursday,and Saturday with dialysis.   furosemide 40 MG tablet Commonly known as: LASIX Hold until followup with outpatient doctor due to intermittent low blood pressure.   guaiFENesin-dextromethorphan 100-10 MG/5ML syrup Commonly known as: ROBITUSSIN DM Take 10 mLs by mouth every 6 (six) hours as needed for cough.   hydrALAZINE 100 MG tablet Commonly known as: APRESOLINE Hold until followup with outpatient doctor due to intermittent low blood  pressure.   insulin glargine 100 UNIT/ML injection Commonly known as: LANTUS Inject 9-10 Units into the skin daily.   isosorbide mononitrate 30 MG 24 hr tablet Commonly known as: IMDUR Hold until followup with outpatient doctor due to intermittent low blood pressure.   lidocaine 5 % Commonly known as: LIDODERM Place 1 patch onto the skin daily.   pantoprazole 40 MG tablet Commonly known as: PROTONIX Take 40 mg by mouth 2 (two) times daily before a meal.        Follow-up Information    Midge Minium, PA. Schedule an appointment as soon as possible for a visit in 1 week.   Specialty: Family Medicine Why: patient to make own follow up appointment Contact information: [place] [address] [phone]               Allergies  Allergen Reactions  . Metformin Other (See Comments)    Severe diarrhea   . Penicillins Hives and Other (See Comments)    Has patient had a PCN reaction causing immediate rash, facial/tongue/throat swelling, SOB or lightheadedness with hypotension:Yes Has patient had a PCN reaction causing severe rash involving mucus membranes or skin necrosis:Yes Has patient had a PCN reaction that required hospitalization:inpatient at  the time of reaction. Has patient had a PCN reaction occurring within the last 10 years:No If all of the above answers are "NO", then may proceed with Cephalosporin use.      The results of significant diagnostics from this hospitalization (including imaging, microbiology, ancillary and laboratory) are listed below for reference.   Consultations:   Procedures/Studies: CT ABDOMEN PELVIS WO CONTRAST  Result Date: 02/06/2021 CLINICAL DATA:  Flank pain.  Black tarry stools. EXAM: CT ABDOMEN AND PELVIS WITHOUT CONTRAST TECHNIQUE: Multidetector CT imaging of the abdomen and pelvis was performed following the standard protocol without IV contrast. COMPARISON:  CT dated March 07, 2020 FINDINGS:  Lower chest: There are ground-glass airspace opacities at the lung bases bilaterally, right worse than left. There is interlobular septal thickening. There are small bilateral pleural effusions, right greater than left.There is cardiomegaly. The intracardiac blood pool is hypodense relative to the adjacent myocardium consistent with anemia. Hepatobiliary: The liver is normal. Cholelithiasis without acute inflammation.There is no biliary ductal dilation. Pancreas: The pancreas was poorly evaluated secondary to lack of IV contrast and lack of intra-abdominal fat. Spleen: Unremarkable. Adrenals/Urinary Tract: --Adrenal glands: Unremarkable. --Right kidney/ureter: The right kidney is atrophic without evidence for hydronephrosis. --Left kidney/ureter: The patient is status post left-sided nephrectomy. --Urinary bladder: There is mild bladder wall thickening. Stomach/Bowel: --Stomach/Duodenum: No hiatal hernia or other gastric abnormality. Normal duodenal course and caliber. --Small bowel: Unremarkable. --Colon: Unremarkable. --Appendix: Normal. Vascular/Lymphatic: Atherosclerotic calcification is present within the non-aneurysmal abdominal aorta, without hemodynamically significant stenosis. --No retroperitoneal lymphadenopathy. --No mesenteric lymphadenopathy. --No pelvic or inguinal lymphadenopathy. Reproductive: Unremarkable Other: No ascites or free air. There are bilateral fat containing inguinal hernias. Musculoskeletal. There is a severe compression fracture of the T12 vertebral body. This is essentially unchanged from prior studies. There is no new acute compression fracture. There is osteopenia. IMPRESSION: 1. Ground-glass airspace opacities at the lung bases bilaterally, right worse than left, with interlobular septal thickening and small bilateral pleural effusions. Findings are suggestive of developing pulmonary edema. An atypical infectious process is not excluded. 2. Cholelithiasis without acute  inflammation. 3. Mild bladder wall thickening. Recommend correlation with urinalysis to exclude cystitis. 4. Cardiomegaly. 5. Anemia. 6. Status post prior left nephrectomy. 7. Unchanged compression fracture of the T12 vertebral body. Aortic Atherosclerosis (ICD10-I70.0). Electronically Signed   By: Constance Holster M.D.   On: 02/06/2021 15:13   CT Head Wo Contrast  Result Date: 02/12/2021 CLINICAL DATA:  60 year old male with unexplained altered mental status. History of GI bleeding this month. EXAM: CT HEAD WITHOUT CONTRAST TECHNIQUE: Contiguous axial images were obtained from the base of the skull through the vertex without intravenous contrast. COMPARISON:  Brain MRI 08/21/2016.  Head CT 08/21/2016. FINDINGS: Brain: Mild generalized cerebral volume loss since 2017. No midline shift, ventriculomegaly, mass effect, evidence of mass lesion, intracranial hemorrhage or evidence of cortically based acute infarction. Patchy and confluent chronic hypodensity in the bilateral corona radiata, bilateral deep gray nuclei (greater on the left). Gray-white matter differentiation appears stable since 2017. No definite cortical encephalomalacia. Vascular: Extensive Calcified atherosclerosis at the skull base. No suspicious intracranial vascular hyperdensity. Skull: No acute osseous abnormality identified. Sinuses/Orbits: New left [facility] obstructive pattern of paranasal sinus disease since 2017. Complete opacification of the left frontal, anterior ethmoid and visible left maxillary sinuses. Other sinuses remain well aerated. Tympanic cavities and mastoids remain clear. Other: Generalized scalp vessel calcified atherosclerosis. Chronic postoperative changes to both globes, greater on the left. IMPRESSION: 1. No acute intracranial abnormality. Stable appearance  of advanced chronic small vessel disease since 2017. 2. New left [facility] pattern obstructive paranasal sinus disease since 2017. 3. Advanced calcified atherosclerosis of  scalp vessels, suggesting end stage renal disease. Electronically Signed   By: Genevie Ann M.D.   On: 02/12/2021 10:00   DG Chest Portable 1 View  Result Date: 02/12/2021 CLINICAL DATA:  COVID-19 positive. Weakness and altered mental status EXAM: PORTABLE CHEST 1 VIEW COMPARISON:  February 06, 2021 FINDINGS: There is persistent cardiomegaly with pulmonary venous hypertension. Interstitium remains diffusely prominent, likely due to interstitial pulmonary edema. No consolidation or well-defined airspace opacity. No adenopathy. There is calcification in each carotid artery. IMPRESSION: Persistent cardiomegaly with pulmonary vascular congestion. Suspect interstitial edema with a degree of congestive heart failure. It should be noted that atypical organism pneumonia superimposed is possible and may be present concurrently with pulmonary edema. Appearance essentially stable compared to most recent study. Carotid artery calcification noted bilaterally. Electronically Signed   By: Lowella Grip III M.D.   On: 02/12/2021 09:02   DG Chest Portable 1 View  Result Date: 02/06/2021 CLINICAL DATA:  COVID positive EXAM: PORTABLE CHEST 1 VIEW COMPARISON:  03/07/2020 FINDINGS: Cardiomegaly with vascular congestion. Small bilateral effusions. Diffuse bilateral interstitial and hazy pulmonary opacity, suspect pulmonary edema. No pneumothorax. IMPRESSION: Cardiomegaly with vascular congestion, small bilateral effusions and diffuse bilateral interstitial and hazy pulmonary opacity, suspect for pulmonary edema. Electronically Signed   By: Donavan Foil M.D.   On: 02/06/2021 18:19      Labs: BNP (last 3 results) No results for input(s): BNP in the last 8760 hours. Basic Metabolic Panel: Recent Labs  Lab 02/12/21 0842 02/13/21 0633 02/14/21 0451 02/15/21 0550 02/16/21 0444  NA 140 137 137 135 134*  K 3.9 4.6 4.7 5.2* 4.7  CL 101 101 100 97* 96*  CO2 27 24 28 24 26   GLUCOSE 71 245* 195* 262* 260*  BUN 32* 46*  28* 54* 50*  CREATININE 4.99* 6.21* 4.63* 6.39* 5.32*  CALCIUM 7.8* 7.5* 7.8* 7.9* 7.9*  MG  --   --  2.2 2.1 1.9   Liver Function Tests: Recent Labs  Lab 02/12/21 0842  AST 72*  ALT 33  ALKPHOS 266*  BILITOT 1.7*  PROT 6.3*  ALBUMIN 2.7*   Recent Labs  Lab 02/12/21 0842  LIPASE 31   Recent Labs  Lab 02/12/21 0842  AMMONIA 14   CBC: Recent Labs  Lab 02/12/21 0842 02/13/21 0633 02/14/21 0451 02/15/21 0550 02/16/21 0444  WBC 4.7 2.9* 7.8 5.1 5.6  NEUTROABS 3.3  --   --   --   --   HGB 10.7* 10.2* 9.9* 9.3* 9.3*  HCT 33.0* 31.4* 30.8* 29.3* 28.9*  MCV 90.9 90.8 91.9 92.4 91.7  PLT 139* 162 180 196 179   Cardiac Enzymes: No results for input(s): CKTOTAL, CKMB, CKMBINDEX, TROPONINI in the last 168 hours. BNP: Invalid input(s): POCBNP CBG: Recent Labs  Lab 02/15/21 0814 02/15/21 1319 02/15/21 1805 02/15/21 2015 02/16/21 0838  GLUCAP 237* 176* 231* 218* 265*   D-Dimer No results for input(s): DDIMER in the last 72 hours. Hgb A1c No results for input(s): HGBA1C in the last 72 hours. Lipid Profile No results for input(s): CHOL, HDL, LDLCALC, TRIG, CHOLHDL, LDLDIRECT in the last 72 hours. Thyroid function studies No results for input(s): TSH, T4TOTAL, T3FREE, THYROIDAB in the last 72 hours.  Invalid input(s): FREET3 Anemia work up No results for input(s): VITAMINB12, FOLATE, FERRITIN, TIBC, IRON, RETICCTPCT in the last 72 hours. Urinalysis  Component Value Date/Time   COLORURINE YELLOW (A) 02/12/2021 1006   APPEARANCEUR CLEAR (A) 02/12/2021 1006   APPEARANCEUR Turbid 10/27/2012 1056   LABSPEC 1.011 02/12/2021 1006   LABSPEC 1.017 10/27/2012 1056   PHURINE 8.0 02/12/2021 1006   GLUCOSEU NEGATIVE 02/12/2021 1006   GLUCOSEU >=500 10/27/2012 1056   HGBUR NEGATIVE 02/12/2021 [place] 02/12/2021 1006   BILIRUBINUR Negative 10/27/2012 [place] 02/12/2021 1006   PROTEINUR 100 (A) 02/12/2021 1006   NITRITE NEGATIVE  02/12/2021 1006   LEUKOCYTESUR NEGATIVE 02/12/2021 1006   LEUKOCYTESUR Negative 10/27/2012 1056   Sepsis Labs Invalid input(s): PROCALCITONIN,  WBC,  LACTICIDVEN Microbiology Recent Results (from the past 240 hour(s))  Resp Panel by RT-PCR (Flu A&B, Covid) Nasopharyngeal Swab     Status: Abnormal   Collection Time: 02/06/21  3:26 PM   Specimen: Nasopharyngeal Swab; Nasopharyngeal(NP) swabs in vial transport medium  Result Value Ref Range Status   SARS Coronavirus 2 by RT PCR POSITIVE (A) NEGATIVE Final    Comment: RESULT CALLED TO, READ BACK BY AND VERIFIED WITH: ROBIN REGISTER 02/06/21 1623 KLW (NOTE) SARS-CoV-2 target nucleic acids are DETECTED.  The SARS-CoV-2 RNA is generally detectable in upper respiratory specimens during the acute phase of infection. Positive results are indicative of the presence of the identified virus, but do not rule out bacterial infection or co-infection with other pathogens not detected by the test. Clinical correlation with patient history and other diagnostic information is necessary to determine patient infection status. The expected result is Negative.  Fact Sheet for Patients: EntrepreneurPulse.com.au  Fact Sheet for Healthcare Providers: IncredibleEmployment.be  This test is not yet approved or cleared by the [place] FDA and  has been authorized for detection and/or diagnosis of SARS-CoV-2 by FDA under an Emergency Use Authorization (EUA).  This EUA will remain in effect (meaning this test can be u sed) for the duration of  the COVID-19 declaration under Section 564(b)(1) of the Act, 21 U.S.C. section 360bbb-3(b)(1), unless the authorization is terminated or revoked sooner.     Influenza A by PCR NEGATIVE NEGATIVE Final   Influenza B by PCR NEGATIVE NEGATIVE Final    Comment: (NOTE) The Xpert Xpress SARS-CoV-2/FLU/RSV plus assay is intended as an aid in the diagnosis of influenza from  Nasopharyngeal swab specimens and should not be used as a sole basis for treatment. Nasal washings and aspirates are unacceptable for Xpert Xpress SARS-CoV-2/FLU/RSV testing.  Fact Sheet for Patients: EntrepreneurPulse.com.au  Fact Sheet for Healthcare Providers: IncredibleEmployment.be  This test is not yet approved or cleared by the [place] FDA and has been authorized for detection and/or diagnosis of SARS-CoV-2 by FDA under an Emergency Use Authorization (EUA). This EUA will remain in effect (meaning this test can be used) for the duration of the COVID-19 declaration under Section 564(b)(1) of the Act, 21 U.S.C. section 360bbb-3(b)(1), unless the authorization is terminated or revoked.  Performed at [facility], [address] [place], [place] [address]   Culture, blood (Routine X 2) w Reflex to ID Panel     Status: None   Collection Time: 02/06/21  7:29 PM   Specimen: BLOOD  Result Value Ref Range Status   Specimen Description BLOOD BLOOD RIGHT HAND  Final   Special Requests   Final    BOTTLES DRAWN AEROBIC AND ANAEROBIC Blood Culture adequate volume   Culture   Final    NO GROWTH 5 DAYS Performed at [facility], [address] [place], [place] [address]    Report Status 02/11/2021 FINAL  Final  Culture, blood (Routine X 2) w Reflex to ID Panel     Status: None   Collection Time: 02/06/21  7:30 PM   Specimen: BLOOD  Result Value Ref Range Status   Specimen Description BLOOD BLOOD LEFT HAND  Final   Special Requests   Final    BOTTLES DRAWN AEROBIC AND ANAEROBIC Blood Culture adequate volume   Culture   Final    NO GROWTH 5 DAYS Performed at [facility], [address] [place], [place] [address]    Report Status 02/11/2021 FINAL  Final  Blood culture (routine single)     Status: None (Preliminary result)   Collection Time: 02/12/21  8:42 AM   Specimen: BLOOD  Result Value Ref Range Status    Specimen Description BLOOD LEFT AC  Final   Special Requests   Final    BOTTLES DRAWN AEROBIC AND ANAEROBIC Blood Culture results may not be optimal due to an excessive volume of blood received in culture bottles   Culture   Final    NO GROWTH 4 DAYS Performed at [facility], [address], [place], [place] [address]    Report Status PENDING  Incomplete  Urine culture     Status: None   Collection Time: 02/12/21 10:06 AM   Specimen: In/Out Cath Urine  Result Value Ref Range Status   Specimen Description   Final    IN/OUT CATH URINE Performed at [facility], [address] [place], [place] [address]    Special Requests   Final    NONE Performed at [facility], [address] [place], [place] [address]    Culture   Final    NO GROWTH Performed at [facility], [place] [address], [place], [place] [address]    Report Status 02/13/2021 FINAL  Final     Total time spend on discharging this patient, including the last patient exam, discussing the hospital stay, instructions for ongoing care as it relates to all pertinent caregivers, as well as preparing the medical discharge records, prescriptions, and/or referrals as applicable, is 30 minutes.    Enzo Bi, MD  [facility] 02/16/2021, 1:15 PM

## 2021-02-16 NOTE — Progress Notes (Signed)
SATURATION QUALIFICATIONS: (This note is used to comply with regulatory documentation for home oxygen)  Patient Saturations on Room Air at Rest = 99%  Patient Saturations on Room Air while Ambulating = 96%  Patient Saturations on 0 Liters of oxygen while Ambulating = 96%  Please briefly explain why patient needs home oxygen:  Patient's oxygen was maintained above 96% on room air.

## 2021-02-16 NOTE — Discharge Instructions (Signed)
10 Things You Can Do to Manage Your COVID-19 Symptoms at Home If you have possible or confirmed COVID-19: 1. Stay home except to get medical care. 2. Monitor your symptoms carefully. If your symptoms get worse, call your healthcare provider immediately. 3. Get rest and stay hydrated. 4. If you have a medical appointment, call the healthcare provider ahead of time and tell them that you have or may have COVID-19. 5. For medical emergencies, call 911 and notify the dispatch personnel that you have or may have COVID-19. 6. Cover your cough and sneezes with a tissue or use the inside of your elbow. 7. Wash your hands often with soap and water for at least 20 seconds or clean your hands with an alcohol-based hand sanitizer that contains at least 60% alcohol. 8. As much as possible, stay in a specific room and away from other people in your home. Also, you should use a separate bathroom, if available. If you need to be around other people in or outside of the home, wear a mask. 9. Avoid sharing personal items with other people in your household, like dishes, towels, and bedding. 10. Clean all surfaces that are touched often, like counters, tabletops, and doorknobs. Use household cleaning sprays or wipes according to the label instructions. [email or web address] 07/05/2020 This information is not intended to replace advice given to you by your health care provider. Make sure you discuss any questions you have with your health care provider. Document Revised: 10/21/2020 Document Reviewed: 10/21/2020 Elsevier Patient Education  2021 [place] Can Do to Manage Your COVID-19 Symptoms at Home If you have possible or confirmed COVID-19: 11. Stay home except to get medical care. 12. Monitor your symptoms carefully. If your symptoms get worse, call your healthcare provider immediately. 13. Get rest and stay hydrated. 14. If you have a medical appointment, call the healthcare provider ahead  of time and tell them that you have or may have COVID-19. 15. For medical emergencies, call 911 and notify the dispatch personnel that you have or may have COVID-19. 16. Cover your cough and sneezes with a tissue or use the inside of your elbow. 64. Wash your hands often with soap and water for at least 20 seconds or clean your hands with an alcohol-based hand sanitizer that contains at least 60% alcohol. 18. As much as possible, stay in a specific room and away from other people in your home. Also, you should use a separate bathroom, if available. If you need to be around other people in or outside of the home, wear a mask. 19. Avoid sharing personal items with other people in your household, like dishes, towels, and bedding. 20. Clean all surfaces that are touched often, like counters, tabletops, and doorknobs. Use household cleaning sprays or wipes according to the label instructions. [email or web address] 07/05/2020 This information is not intended to replace advice given to you by your health care provider. Make sure you discuss any questions you have with your health care provider. Document Revised: 10/21/2020 Document Reviewed: 10/21/2020 Elsevier Patient Education  2021 [place] Can Do to Manage Your COVID-19 Symptoms at Home If you have possible or confirmed COVID-19: 21. Stay home except to get medical care. 22. Monitor your symptoms carefully. If your symptoms get worse, call your healthcare provider immediately. 23. Get rest and stay hydrated. 24. If you have a medical appointment, call the healthcare provider ahead of time and tell them that you have or may have  COVID-19. 25. For medical emergencies, call 911 and notify the dispatch personnel that you have or may have COVID-19. 26. Cover your cough and sneezes with a tissue or use the inside of your elbow. 38. Wash your hands often with soap and water for at least 20 seconds or clean your hands with an  alcohol-based hand sanitizer that contains at least 60% alcohol. 28. As much as possible, stay in a specific room and away from other people in your home. Also, you should use a separate bathroom, if available. If you need to be around other people in or outside of the home, wear a mask. 29. Avoid sharing personal items with other people in your household, like dishes, towels, and bedding. 30. Clean all surfaces that are touched often, like counters, tabletops, and doorknobs. Use household cleaning sprays or wipes according to the label instructions. [email or web address] 07/05/2020 This information is not intended to replace advice given to you by your health care provider. Make sure you discuss any questions you have with your health care provider. Document Revised: 10/21/2020 Document Reviewed: 10/21/2020 Elsevier Patient Education  2021 [place].   Blood Glucose Monitoring, Adult Monitoring your blood sugar (glucose) is an important part of managing your diabetes. Blood glucose monitoring involves checking your blood glucose as often as directed and keeping a log or record of your results over time. Checking your blood glucose regularly and keeping a blood glucose log can:  Help you and your health care provider adjust your diabetes management plan as needed, including your medicines or insulin.  Help you understand how food, exercise, illnesses, and medicines affect your blood glucose.  Let you know what your blood glucose is at any time. You can quickly find out if you have low blood glucose (hypoglycemia) or high blood glucose (hyperglycemia). Your health care provider will set individualized treatment goals for you. Your goals will be based on your age, other medical conditions you have, and how you respond to diabetes treatment. Generally, the goal of treatment is to maintain the following blood glucose levels:  Before meals (preprandial): 80-130 mg/dL (4.4-7.2 mmol/L).  After  meals (postprandial): below 180 mg/dL (10 mmol/L).  A1C level: less than 7%. Supplies needed:  Blood glucose meter.  Test strips for your meter. Each meter has its own strips. You must use the strips that came with your meter.  A needle to prick your finger (lancet). Do not use a lancet more than one time.  A device that holds the lancet (lancing device).  A journal or log book to write down your results. How to check your blood glucose Checking your blood glucose 1. Wash your hands for at least 20 seconds with soap and water. 2. Prick the side of your finger (not the tip) with the lancet. Do not use the same finger consecutively. 3. Gently rub the finger until a small drop of blood appears. 4. Follow instructions that come with your meter for inserting the test strip, applying blood to the strip, and using your blood glucose meter. 5. Write down your result and any notes in your log.   Using alternative sites Some meters allow you to use areas of your body other than your finger (alternative sites) to test your blood. The most common alternative sites are the forearm, the thigh, and the palm of your hand. Alternative sites may not be as accurate as the fingers because blood flow is slower in those areas. This means that the result you get may  be delayed, and it may be different from the result that you would get from your finger. Use the finger only, and do not use alternative sites, if:  You think you have hypoglycemia.  You sometimes do not know that your blood glucose is getting low (hypoglycemia unawareness). General tips and recommendations Blood glucose log  Every time you check your blood glucose, write down your result. Also write down any notes about things that may be affecting your blood glucose, such as your diet and exercise for the day. This information can help you and your health care provider: ? Look for patterns in your blood glucose over time. ? Adjust your  diabetes management plan as needed.  Check if your meter allows you to download your records to a computer or if there is an app for the meter. Most glucose meters store a record of glucose readings in the meter.   If you have type 1 diabetes:  Check your blood glucose 4 or more times a day if you are on intensive insulin therapy with multiple daily injections (MDI) or if you are using an insulin pump. Check your blood glucose: ? Before every meal and snack. ? Before bedtime.  Also check your blood glucose: ? If you have symptoms of hypoglycemia. ? After treating low blood glucose. ? Before doing activities that create a risk for injury, like driving or using machinery. ? Before and after exercise. ? Two hours after a meal. ? Occasionally between 2:00 a.m. and 3:00 a.m., as directed.  You may need to check your blood glucose more often, 6-10 times per day, if: ? You have diabetes that is not well controlled. ? You are ill. ? You have a history of severe hypoglycemia. ? You have hypoglycemia unawareness. If you have type 2 diabetes:  Check your blood glucose 2 or more times a day if you take insulin or other diabetes medicines.  Check your blood glucose 4 or more times a day if you are on intensive insulin therapy. Occasionally, you may also need to check your glucose between 2:00 a.m. and 3:00 a.m., as directed.  Also check your blood glucose: ? Before and after exercise. ? Before doing activities that create a risk for injury, like driving or using machinery.  You may need to check your blood glucose more often if: ? Your medicine is being adjusted. ? Your diabetes is not well controlled. ? You are ill. General tips  Make sure you always have your supplies with you.  After you use a few boxes of test strips, adjust (calibrate) your blood glucose meter by following instructions that came with your meter.  If you have questions or need help, all blood glucose meters have a  24-hour hotline phone number available that you can call. Also contact your health care provider with questions or concerns you may have. Where to find more information  The American Diabetes Association: www.diabetes.org  The Association of Diabetes Care & Education Specialists: www.diabeteseducator.org Contact a health care provider if:  Your blood glucose is at or above 240 mg/dL (13.3 mmol/L) for 2 days in a row.  You have been sick or have had a fever for 2 days or longer, and you are not getting better.  You have any of the following problems for more than 6 hours: ? You cannot eat or drink. ? You have nausea or vomiting. ? You have diarrhea. Get help right away if:  Your blood glucose is lower than 54  mg/dL (3 mmol/L).  You become confused, or you have trouble thinking clearly.  You have difficulty breathing.  You have moderate or large ketone levels in your urine. These symptoms may represent a serious problem that is an emergency. Do not wait to see if the symptoms will go away. Get medical help right away. Call your local emergency services (911 in the [place] Do not drive yourself to the hospital. Summary  Monitoring your blood glucose is an important part of managing your diabetes.  Blood glucose monitoring involves checking your blood glucose as often as directed and keeping a log or record of your results over time.  Your health care provider will set individualized treatment goals for you. Your goals will be based on your age, other medical conditions you have, and how you respond to diabetes treatment.  Every time you check your blood glucose, write down your result. Also, write down any notes about things that may be affecting your blood glucose, such as your diet and exercise for the day. This information is not intended to replace advice given to you by your health care provider. Make sure you discuss any questions you have with your health care  provider. Document Revised: 09/04/2020 Document Reviewed: 09/04/2020 Elsevier Patient Education  2021 [employer].

## 2021-02-17 LAB — CULTURE, BLOOD (SINGLE): Culture: NO GROWTH

## 2021-02-24 ENCOUNTER — Encounter: Payer: Self-pay | Admitting: Gastroenterology

## 2021-03-04 ENCOUNTER — Other Ambulatory Visit: Payer: Self-pay | Admitting: Family Medicine

## 2021-03-04 ENCOUNTER — Emergency Department
Admission: EM | Admit: 2021-03-04 | Discharge: 2021-03-04 | Disposition: A | Discharge status: Home / Self Care | Payer: Medicare Other | Attending: Emergency Medicine | Admitting: Emergency Medicine

## 2021-03-04 ENCOUNTER — Emergency Department
Admission: RE | Admit: 2021-03-04 | Discharge: 2021-03-04 | Disposition: A | Discharge status: Home / Self Care | Payer: Medicare Other | Source: Ambulatory Visit | Attending: Family Medicine | Admitting: Family Medicine

## 2021-03-04 ENCOUNTER — Encounter: Payer: Self-pay | Admitting: Emergency Medicine

## 2021-03-04 ENCOUNTER — Other Ambulatory Visit (HOSPITAL_COMMUNITY): Payer: Self-pay | Admitting: Family Medicine

## 2021-03-04 ENCOUNTER — Other Ambulatory Visit: Payer: Self-pay

## 2021-03-04 DIAGNOSIS — Z5321 Procedure and treatment not carried out due to patient leaving prior to being seen by health care provider: Secondary | ICD-10-CM | POA: Diagnosis not present

## 2021-03-04 DIAGNOSIS — R1030 Lower abdominal pain, unspecified: Secondary | ICD-10-CM | POA: Diagnosis not present

## 2021-03-04 DIAGNOSIS — R350 Frequency of micturition: Secondary | ICD-10-CM | POA: Diagnosis present

## 2021-03-04 DIAGNOSIS — R34 Anuria and oliguria: Secondary | ICD-10-CM | POA: Insufficient documentation

## 2021-03-04 DIAGNOSIS — N3289 Other specified disorders of bladder: Secondary | ICD-10-CM

## 2021-03-04 DIAGNOSIS — J90 Pleural effusion, not elsewhere classified: Secondary | ICD-10-CM | POA: Diagnosis not present

## 2021-03-04 DIAGNOSIS — Z905 Acquired absence of kidney: Secondary | ICD-10-CM | POA: Insufficient documentation

## 2021-03-04 LAB — LIPASE, BLOOD: Lipase: 28 U/L (ref 11–51)

## 2021-03-04 LAB — COMPREHENSIVE METABOLIC PANEL
ALT: 18 U/L (ref 0–44)
AST: 17 U/L (ref 15–41)
Albumin: 2.7 g/dL — ABNORMAL LOW (ref 3.5–5.0)
Alkaline Phosphatase: 124 U/L (ref 38–126)
Anion gap: 11 (ref 5–15)
BUN: 65 mg/dL — ABNORMAL HIGH (ref 6–20)
CO2: 21 mmol/L — ABNORMAL LOW (ref 22–32)
Calcium: 7.9 mg/dL — ABNORMAL LOW (ref 8.9–10.3)
Chloride: 107 mmol/L (ref 98–111)
Creatinine, Ser: 6.25 mg/dL — ABNORMAL HIGH (ref 0.61–1.24)
GFR, Estimated: 10 mL/min — ABNORMAL LOW (ref >60)
Glucose, Bld: 198 mg/dL — ABNORMAL HIGH (ref 70–99)
Potassium: 4.1 mmol/L (ref 3.5–5.1)
Sodium: 139 mmol/L (ref 135–145)
Total Bilirubin: 1.1 mg/dL (ref 0.3–1.2)
Total Protein: 6.1 g/dL — ABNORMAL LOW (ref 6.5–8.1)

## 2021-03-04 LAB — CBC
HCT: 30.6 % — ABNORMAL LOW (ref 39.0–52.0)
Hemoglobin: 9.4 g/dL — ABNORMAL LOW (ref 13.0–17.0)
MCH: 30.7 pg (ref 26.0–34.0)
MCHC: 30.7 g/dL (ref 30.0–36.0)
MCV: 100 fL (ref 80.0–100.0)
Platelets: 145 10*3/uL — ABNORMAL LOW (ref 150–400)
RBC: 3.06 MIL/uL — ABNORMAL LOW (ref 4.22–5.81)
RDW: 20.1 % — ABNORMAL HIGH (ref 11.5–15.5)
WBC: 7.7 10*3/uL (ref 4.0–10.5)
nRBC: 0 % (ref 0.0–0.2)

## 2021-03-04 NOTE — ED Notes (Signed)
Bladder scan reads 55mL

## 2021-03-04 NOTE — ED Triage Notes (Addendum)
Pt via POV from home. Pt c/o "feeling like my bladder is about to bust." Pt states when he urinates it is only a dribble. Pt is a dialysis pt. 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 Pt was recently d/c from the hospital on Sunday for a bladder infection. Pt is A&Ox4 and NAD.

## 2021-03-05 ENCOUNTER — Ambulatory Visit: Admission: RE | Admit: 2021-03-05 | Payer: Medicare Other | Source: Ambulatory Visit

## 2021-03-11 ENCOUNTER — Other Ambulatory Visit: Payer: Medicare Other

## 2021-03-11 ENCOUNTER — Ambulatory Visit: Payer: Medicare Other | Attending: Family Medicine | Admitting: Physical Therapy

## 2021-03-11 ENCOUNTER — Other Ambulatory Visit: Payer: Self-pay

## 2021-03-11 ENCOUNTER — Encounter: Payer: Self-pay | Admitting: Physical Therapy

## 2021-03-11 DIAGNOSIS — M6281 Muscle weakness (generalized): Secondary | ICD-10-CM | POA: Insufficient documentation

## 2021-03-11 NOTE — Therapy (Signed)
[facility] [address] [place], [address] Phone: [phone]   Fax:  [phone]  Physical Therapy Evaluation  Patient Details  Name: Richard Zavala MRN: [id number] Date of Birth: [birth year] No data recorded  Encounter Date: 03/11/2021   PT End of Session - 03/11/21 1424    Visit Number 1    Number of Visits 17    Date for PT Re-Evaluation 05/09/21    PT Start Time 1310    PT Stop Time 1345    PT Time Calculation (min) 35 min    Equipment Utilized During Treatment Gait belt    Activity Tolerance Patient tolerated treatment well    Behavior During Therapy [facility] for tasks assessed/performed           Past Medical History:  Diagnosis Date  . Anemia   . Blind   . BPH (benign prostatic hyperplasia)   . CAD (coronary artery disease)   . CHF (congestive heart failure) ([place])   . Chronic kidney disease (CKD), stage IV (severe) (H[facility])    DIALYSIS  . Diabetes mellitus without complication ([place])   . ESRD (end stage renal disease) ([place])    Monday-Wednesday-Friday dialysis  . Hyperlipemia   . Hypertension   . Neuropathy   . Osteoporosis   . Pulmonary HTN ([place])   . Stroke [facility])    2013  . TIA (transient ischemic attack)   . TRD (traction retinal detachment)   . Wilms' tumor [facility])     Past Surgical History:  Procedure Laterality Date  . A/V FISTULAGRAM Left 01/28/2017   Procedure: A/V Fistulagram;  Surgeon: Algernon Huxley, MD;  Location: [place] [facility];  Service: Cardiovascular;  Laterality: Left;  . A/V FISTULAGRAM Left 04/28/2018   Procedure: A/V FISTULAGRAM;  Surgeon: Algernon Huxley, MD;  Location: [place] [facility];  Service: Cardiovascular;  Laterality: Left;  . A/V FISTULAGRAM N/A 03/21/2020   Procedure: A/V FISTULAGRAM;  Surgeon: Algernon Huxley, MD;  Location: [place] [facility];  Service: Cardiovascular;  Laterality: N/A;  . AV FISTULA PLACEMENT    . AV FISTULA PLACEMENT Right 11/23/2019    Procedure: ARTERIOVENOUS (AV) FISTULA CREATION (RADIOCEPHALIC );  Surgeon: Algernon Huxley, MD;  Location: [facility] ORS;  Service: Vascular;  Laterality: Right;  . CARDIAC CATHETERIZATION    . CATARACT EXTRACTION W/PHACO Right 05/14/2016   Procedure: CATARACT EXTRACTION PHACO AND INTRAOCULAR LENS PLACEMENT (IOC);  Surgeon: Eulogio Bear, MD;  Location: [facility] ORS;  Service: Ophthalmology;  Laterality: Right;  [place] 1.46 AP% 11.5 CDE 12.33 FLUID PACK LOT # [id number] H  . EYE SURGERY    . FRACTURE SURGERY     RIGHT LEG WITH ROD  . HIP SURGERY    . NEPHRECTOMY RADICAL    . PERIPHERAL VASCULAR CATHETERIZATION N/A 08/28/2015   Procedure: A/V Shuntogram/Fistulagram;  Surgeon: Algernon Huxley, MD;  Location: [place] [facility];  Service: Cardiovascular;  Laterality: N/A;  . PERIPHERAL VASCULAR CATHETERIZATION Left 08/28/2015   Procedure: A/V Shunt Intervention;  Surgeon: Algernon Huxley, MD;  Location: [place] [facility];  Service: Cardiovascular;  Laterality: Left;  . UPPER EXTREMITY ANGIOGRAPHY Right 08/01/2020   Procedure: UPPER EXTREMITY ANGIOGRAPHY;  Surgeon: Algernon Huxley, MD;  Location: [place] [facility];  Service: Cardiovascular;  Laterality: Right;    There were no vitals filed for this visit.    Subjective Assessment - 03/11/21 1313    Pertinent History Pt is a 60  year old male presenting with bilat LE weakness since COVID lockdowns in March 2020. Patient reports he has not left his house much d/t his children not taking him anywhere. He reports he uses a shopping cart and a RW in the community since last year. Patient does not use RW at home reports he furniture walks. His son helps him with his shoes and socks, but he can usually dress himself sitting. Bathes himself sititng on shower seat in walk in showers. Reports his son lives with him, and completes all household ADLs, is gone to work from 7a-4p. No stairs to enter or get inside his home. Is going to dialysis MWF for renal failure. Has no vision in  his L eye d/t retinopathy. Reports he has cramping/spasm in L side of the back reporting it is constant 4/10, increasing to 6/10 with walking short distances. Pt is on disability, no hobbies. Pt denies N/V, B&B changes, unexplained weight fluctuation, saddle paresthesia, fever, night sweats, or unrelenting night pain at this time. Has not had a fall, but many near falls in past 6 months.    Limitations House hold activities;Lifting;Walking;Sitting;Standing    How long can you sit comfortably? unlimited    How long can you stand comfortably? unable to stand without support    How long can you walk comfortably? <80mins with RW    Diagnostic tests none of LB    Patient Stated Goals to be able to walk    Currently in Pain? Yes    Pain Score 4     Pain Location Back    Pain Orientation Left;Mid;Posterior    Pain Descriptors / Indicators Spasm;Cramping    Pain Type Chronic pain    Pain Radiating Towards none    Pain Onset More than a month ago    Pain Frequency Constant    Aggravating Factors  walking or out of bed    Pain Relieving Factors rest, lidocane patch    Effect of Pain on Daily Activities unable to complete OOB activites without assistance                   OBJECTIVE  MUSCULOSKELETAL: Tremor: Absent Bulk: Normal Tone: Normal, no clonus  Posture Slumped forward with L lateral lean (decreased distance from L ASIS and last rib) with clear lack of core control/ability to sit erect  Observation: sores on bilat feet with scabs and healing, education on checking feet to prevent infection  Gait With RW increased speed near shuffle with minimal foot clearance and with heavy UE use  Without RW heavy furniture walking with consistent reaching for therapist/daughter, heavy forward trunk lean Gait speed with RW 0.23m/s  Strength R/L 4-/4 Hip flexion 3+/3+ Hip external rotation 4-/4- Hip internal rotation 4-/4- Hip extension  3-/3 Hip abduction 5/5 Knee extension 5/5 Knee  flexion 3/3 Ankle Dorsiflexion   NEUROLOGICAL: Sensation Little to no sensation of bilat dorsum of feet and great toes No proprioception to great toe movement bilat    FUNCTIONAL OUTCOME MEASURES STS modI for stand; minA for sit with control Pivot transfer chair > mat table CGA with heavy use of UE Supine <> sit modI supervision needed with momentum and increased time needed, multiple attempts Rolling and bridging modI supervision needed with increased time needed and fatigue 10MWT 0.25m/s Sitting reaching without BLE support able to demonstrate wt shift and reach outside BOS with supervision  Standing without support 13sec with CGA x2 for safety (unable to further test standing balance d/t  unsteadiness in standing w/o support)  Ther-Ex PT reviewed the following HEP with patient with patient able to demonstrate a set of the following with min cuing for correction needed. PT educated patient on parameters of therex (how/when to inc/decrease intensity, frequency, rep/set range, stretch hold time, and purpose of therex) with verbalized understanding.  Supine Bridge - 1 x daily - 7 x weekly - 3 sets - 10 reps Supine Active Straight Leg Raise - 1 x daily - 7 x weekly - 3 sets - 10 reps Sidelying Hip Abduction - 1 x daily - 7 x weekly - 3 sets - 10 reps 38min walk with RW 3x/day                       Objective measurements completed on examination: See above findings.               PT Education - 03/11/21 1424    Education Details Patient was educated on diagnosis, anatomy and pathology involved, prognosis, role of PT, and was given an HEP, demonstrating exercise with proper form following verbal and tactile cues, and was given a paper hand out to continue exercise at home. Pt was educated on and agreed to plan of care.    Person(s) Educated Patient    Methods Explanation;Demonstration;Tactile cues;Verbal cues    Comprehension Verbalized understanding;Returned  demonstration;Verbal cues required;Tactile cues required            PT Short Term Goals - 03/11/21 1426      PT SHORT TERM GOAL #1   Title Pt will be independent with HEP in order to improve strength and balance in order to decrease fall risk and improve function at home and in the community    Baseline 03/11/21 HEP given    Time 4    Period Weeks    Status New             PT Long Term Goals - 03/11/21 1427      PT LONG TERM GOAL #1   Title Pt will increase 10MWT to at least 1.0 m/s with LRD in order to demonstrate safe community ambulation speed.    Baseline 03/11/21 0.6m/s    Time 8    Period Weeks    Status New      PT LONG TERM GOAL #2   Title Patient will increase FOTO score to 58 to demonstrate predicted increase in functional mobility to complete ADLs    Baseline 03/11/21 43    Time 8    Period Weeks    Status New      PT LONG TERM GOAL #3   Title Pt will demonstrate modI with all basic transfers to increase safety and independence at home    Baseline 03/11/21 modI for Stand; minA for sit; supervision for all rolling and supine <> sit transfers    Time 8    Period Weeks    Status New                  Plan - 03/11/21 1436    Clinical Impression Statement Pt is a 60 year old male presenting for bilat LE weakness, examination reveals generalized weakness. Impairments in core and BLE strength, proprioception, feet sensation, coordination, static and dynamic balance, activity tolerance, and pain. Activity limitations in ambulation, basic transfers, standing, reaching, dressing, bathing; inhibiting participation in safe, household ADLs and comunity activity. PT will benefit from skilled. Pt will benefit from skilled PT to  address impairments to increase ind and safety in home and in the community.    Personal Factors and Comorbidities Comorbidity 1;Comorbidity 2;Comorbidity 3+;Past/Current Experience;Time since onset of injury/illness/exacerbation     Comorbidities anemia, diabetic retinopathy (blind in L eye), CHF, CAD, CKD, DM2, ESRD, HLD, HTN    Examination-Activity Limitations Reach Overhead;Bed Mobility;Bathing;Dressing;Stand;Hygiene/Grooming;Lift;Carry;Squat;Sleep;Transfers    Examination-Participation Restrictions Meal Prep;Community Activity;Laundry;Cleaning;Yard Work;Driving    Stability/Clinical Decision Making Evolving/Moderate complexity    Clinical Decision Making Moderate    Rehab Potential Good    PT Frequency 2x / week    PT Duration 8 weeks    PT Treatment/Interventions ADLs/Self Care Home Management;Stair training;Therapeutic exercise;Passive range of motion;Spinal Manipulations;Joint Manipulations;Dry needling;Taping;Manual techniques;Neuromuscular re-education;Therapeutic activities;DME Instruction;Moist Heat;Electrical Stimulation;Ultrasound;Gait training;Cryotherapy;Functional mobility training;Balance training;Patient/family education    PT Next Visit Plan HEP review, gait with RW, transfer ind    PT Home Exercise Plan bridge, SLR, sidelying hip abd, walk with RW 82mins 2-3x/day    Consulted and Agree with Plan of Care Patient           Patient will benefit from skilled therapeutic intervention in order to improve the following deficits and impairments:  Decreased endurance,Decreased balance,Decreased mobility,Difficulty walking,Increased muscle spasms,Impaired sensation,Decreased activity tolerance,Decreased coordination,Decreased strength,Increased fascial restricitons,Impaired flexibility,Postural dysfunction,Pain,Improper body mechanics,Decreased range of motion  Visit Diagnosis: No diagnosis found.     Problem List Patient Active Problem List   Diagnosis Date Noted  . Sepsis ([place]) 02/12/2021  . Acute metabolic encephalopathy 11/91/4782  . Acute respiratory failure with hypoxia ([place]) 02/12/2021  . Anemia in ESRD (end-stage renal disease) ([place]) 02/12/2021  . HLD (hyperlipidemia) 02/12/2021  . Stroke ([place])  02/12/2021  . Hypothermia 02/12/2021  . CAP (community acquired pneumonia) 02/12/2021  . ESRD on hemodialysis ([place])   . Acute cystitis 02/06/2021  . Acute upper GI bleed 02/06/2021  . Acute blood loss anemia 02/06/2021  . COVID-19 virus infection 02/06/2021  . Gait instability 10/12/2019  . Pulmonary nodule 10/12/2019  . CAD (coronary artery disease), native coronary artery 03/08/2018  . Hyperlipidemia 03/08/2018  . Essential hypertension 03/08/2018  . Wilms' tumor with favorable history 03/01/2018  . Closed fracture of right distal radius 08/22/2016  . TIA (transient ischemic attack) 08/21/2016  . Encounter for pre-transplant evaluation for kidney transplant 05/17/2016  . Anemia due to other cause   . Hyperkalemia   . Hypoglycemia   . Pain   . End stage renal disease ([place])   . Weakness   . Other chest pain   . Elevated troponin   . Hyperkalemia, diminished renal excretion 08/14/2015  . Hypertensive urgency 03/26/2015  . Anemia in CKD (chronic kidney disease) 12/04/2014  . Chronic kidney disease, stage IV (severe) ([place]) 12/03/2014  . Acute ischemic stroke ([place]) 08/25/2014  . Acute lacunar stroke ([place]) 06/25/2014  . Orthostatic hypotension 06/25/2014  . Diabetic vitreous hemorrhage associated with type 2 diabetes mellitus ([place]) 09/28/2013  . PDR (proliferative diabetic retinopathy) ([place]) 09/28/2013  . History of Wilms' tumor 08/15/2013  . Iron deficiency anemia 07/21/2013  . Mesenteric artery stenosis ([place]) 03/27/2013  . Heart failure, chronic systolic ([place]) 95/62/1308  . Ischemic cardiomyopathy 03/13/2013  . Pulmonary HTN ([place]) 03/13/2013  . Peripheral edema 02/28/2013  . Diabetic macular edema of both eyes ([place]) 01/16/2013  . Corneal epithelial defect 10/06/2012  . TRD (traction retinal detachment) 09/22/2012  . Vitreous hemorrhage of both eyes ([place]) 07/21/2012  . Closed right hip fracture ([place]) 02/19/2012  . Diabetic sensorimotor polyneuropathy ([place]) 01/27/2012  .  Vitamin D deficiency disease 11/30/2011  .  Type II diabetes mellitus with renal manifestations ([place]) 07/03/2011  . Type 2 diabetes mellitus with kidney complication, with long-term current use of insulin ([place]) 07/03/2011  . Osteoporosis with fracture 05/28/2011   Durwin Reges DPT Durwin Reges 03/11/2021, 2:57 PM  [facility] [address] [place], [address] Phone: [phone]   Fax:  [phone]  Name: Richard Zavala MRN: [id number] Date of Birth: [birth year]

## 2021-03-12 ENCOUNTER — Encounter: Payer: Medicare Other | Attending: Internal Medicine | Admitting: Internal Medicine

## 2021-03-12 DIAGNOSIS — L97518 Non-pressure chronic ulcer of other part of right foot with other specified severity: Secondary | ICD-10-CM | POA: Insufficient documentation

## 2021-03-12 DIAGNOSIS — N186 End stage renal disease: Secondary | ICD-10-CM | POA: Insufficient documentation

## 2021-03-12 DIAGNOSIS — E1142 Type 2 diabetes mellitus with diabetic polyneuropathy: Secondary | ICD-10-CM | POA: Insufficient documentation

## 2021-03-12 DIAGNOSIS — Z87891 Personal history of nicotine dependence: Secondary | ICD-10-CM | POA: Diagnosis not present

## 2021-03-12 DIAGNOSIS — I120 Hypertensive chronic kidney disease with stage 5 chronic kidney disease or end stage renal disease: Secondary | ICD-10-CM | POA: Insufficient documentation

## 2021-03-12 DIAGNOSIS — I2510 Atherosclerotic heart disease of native coronary artery without angina pectoris: Secondary | ICD-10-CM | POA: Insufficient documentation

## 2021-03-12 DIAGNOSIS — E1151 Type 2 diabetes mellitus with diabetic peripheral angiopathy without gangrene: Secondary | ICD-10-CM | POA: Insufficient documentation

## 2021-03-12 DIAGNOSIS — Z992 Dependence on renal dialysis: Secondary | ICD-10-CM | POA: Insufficient documentation

## 2021-03-12 DIAGNOSIS — Z794 Long term (current) use of insulin: Secondary | ICD-10-CM | POA: Diagnosis not present

## 2021-03-12 DIAGNOSIS — E1122 Type 2 diabetes mellitus with diabetic chronic kidney disease: Secondary | ICD-10-CM | POA: Insufficient documentation

## 2021-03-12 DIAGNOSIS — W19XXXA Unspecified fall, initial encounter: Secondary | ICD-10-CM | POA: Diagnosis not present

## 2021-03-12 DIAGNOSIS — L97828 Non-pressure chronic ulcer of other part of left lower leg with other specified severity: Secondary | ICD-10-CM | POA: Diagnosis not present

## 2021-03-12 DIAGNOSIS — Z880 Allergy status to penicillin: Secondary | ICD-10-CM | POA: Insufficient documentation

## 2021-03-12 DIAGNOSIS — S82302A Unspecified fracture of lower end of left tibia, initial encounter for closed fracture: Secondary | ICD-10-CM | POA: Diagnosis not present

## 2021-03-12 DIAGNOSIS — Z8673 Personal history of transient ischemic attack (TIA), and cerebral infarction without residual deficits: Secondary | ICD-10-CM | POA: Insufficient documentation

## 2021-03-12 DIAGNOSIS — Z888 Allergy status to other drugs, medicaments and biological substances status: Secondary | ICD-10-CM | POA: Insufficient documentation

## 2021-03-12 DIAGNOSIS — E11621 Type 2 diabetes mellitus with foot ulcer: Secondary | ICD-10-CM | POA: Diagnosis present

## 2021-03-13 ENCOUNTER — Other Ambulatory Visit: Payer: Self-pay

## 2021-03-13 ENCOUNTER — Ambulatory Visit: Payer: Medicare Other | Admitting: Physical Therapy

## 2021-03-13 ENCOUNTER — Encounter: Payer: Self-pay | Admitting: Physical Therapy

## 2021-03-13 DIAGNOSIS — M6281 Muscle weakness (generalized): Secondary | ICD-10-CM | POA: Diagnosis not present

## 2021-03-13 NOTE — Therapy (Signed)
[facility] [address] [place], [address] Phone: [phone]   Fax:  [phone]  Physical Therapy Treatment  Patient Details  Name: Richard Zavala MRN: [id number] Date of Birth: 06-25-1961 No data recorded  Encounter Date: 03/13/2021   PT End of Session - 03/13/21 1314    Visit Number 2    Number of Visits 17    Date for PT Re-Evaluation 05/09/21    PT Start Time 2831    PT Stop Time 1345    PT Time Calculation (min) 40 min    Equipment Utilized During Treatment Gait belt    Activity Tolerance Patient tolerated treatment well    Behavior During Therapy [facility] for tasks assessed/performed           Past Medical History:  Diagnosis Date  . Anemia   . Blind   . BPH (benign prostatic hyperplasia)   . CAD (coronary artery disease)   . CHF (congestive heart failure) ([place])   . Chronic kidney disease (CKD), stage IV (severe) (HCC)    DIALYSIS  . Diabetes mellitus without complication ([place])   . ESRD (end stage renal disease) ([place])    Monday-Wednesday-Friday dialysis  . Hyperlipemia   . Hypertension   . Neuropathy   . Osteoporosis   . Pulmonary HTN ([place])   . Stroke [facility])    2013  . TIA (transient ischemic attack)   . TRD (traction retinal detachment)   . Wilms' tumor [facility])     Past Surgical History:  Procedure Laterality Date  . A/V FISTULAGRAM Left 01/28/2017   Procedure: A/V Fistulagram;  Surgeon: Algernon Huxley, MD;  Location: [place] [facility];  Service: Cardiovascular;  Laterality: Left;  . A/V FISTULAGRAM Left 04/28/2018   Procedure: A/V FISTULAGRAM;  Surgeon: Algernon Huxley, MD;  Location: [place] [facility];  Service: Cardiovascular;  Laterality: Left;  . A/V FISTULAGRAM N/A 03/21/2020   Procedure: A/V FISTULAGRAM;  Surgeon: Algernon Huxley, MD;  Location: [place] [facility];  Service: Cardiovascular;  Laterality: N/A;  . AV FISTULA PLACEMENT    . AV FISTULA PLACEMENT Right 11/23/2019    Procedure: ARTERIOVENOUS (AV) FISTULA CREATION (RADIOCEPHALIC );  Surgeon: Algernon Huxley, MD;  Location: [facility];  Service: Vascular;  Laterality: Right;  . CARDIAC CATHETERIZATION    . CATARACT EXTRACTION W/PHACO Right 05/14/2016   Procedure: CATARACT EXTRACTION PHACO AND INTRAOCULAR LENS PLACEMENT (IOC);  Surgeon: Eulogio Bear, MD;  Location: [facility] OR[facility];  Service: Ophthalmology;  Laterality: Right;  [place] 1.46 AP% 11.5 CDE 12.33 FLUID PACK LOT # [id number] H  . EYE SURGERY    . FRACTURE SURGERY     RIGHT LEG WITH ROD  . HIP SURGERY    . NEPHRECTOMY RADICAL    . PERIPHERAL VASCULAR CATHETERIZATION N/A 08/28/2015   Procedure: A/V Shuntogram/Fistulagram;  Surgeon: Algernon Huxley, MD;  Location: [place] [facility];  Service: Cardiovascular;  Laterality: N/A;  . PERIPHERAL VASCULAR CATHETERIZATION Left 08/28/2015   Procedure: A/V Shunt Intervention;  Surgeon: Algernon Huxley, MD;  Location: [place] [facility];  Service: Cardiovascular;  Laterality: Left;  . UPPER EXTREMITY ANGIOGRAPHY Right 08/01/2020   Procedure: UPPER EXTREMITY ANGIOGRAPHY;  Surgeon: Algernon Huxley, MD;  Location: [place] [facility];  Service: Cardiovascular;  Laterality: Right;    There were no vitals filed for this visit.   Subjective Assessment - 03/13/21 1310    Subjective PT reports muscle spasms of abdominal  today, with increased abdomen pain. Reports he has been completing his HEP some. Ambulates in modI with RW, with  fatigue following ambulating up small incline into clinic    Pertinent History Pt is a 60 year old male presenting with bilat LE weakness since COVID lockdowns in March 2020. Patient reports he has not left his house much d/t his children not taking him anywhere. He reports he uses a shopping cart and a RW in the community since last year. Patient does not use RW at home reports he furniture walks. His son helps him with his shoes and socks, but he can usually dress himself sitting. Bathes himself sititng on shower  seat in walk in showers. Reports his son lives with him, and completes all household ADLs, is gone to work from 7a-4p. No stairs to enter or get inside his home. Is going to dialysis MWF for renal failure. Has no vision in his L eye d/t retinopathy. Reports he has cramping/spasm in L side of the back reporting it is constant 4/10, increasing to 6/10 with walking short distances. Pt is on disability, no hobbies. Is on dialysis MWF, since 2016. Pt denies N/V, B&B changes, unexplained weight fluctuation, saddle paresthesia, fever, night sweats, or unrelenting night pain at this time. Has not had a fall, but many near falls in past 6 months.    Limitations House hold activities;Lifting;Walking;Sitting;Standing    How long can you sit comfortably? unlimited    How long can you stand comfortably? unable to stand without support    How long can you walk comfortably? <40mins with RW    Diagnostic tests none of LB    Patient Stated Goals to be able to walk    Pain Onset More than a month ago           Ther-Ex Supine Bridge 2x 10 with cuing for even push bilat and control with decent carry over Supine Active Straight Leg Raise 2x 10 bilat with cuing for maintaining knee ext Sidelying Hip Abduction 2x 8 with cuing and difficulty maintaining knee ext and for LE control STS from raised mat (25in)  2x 6; with 3sec in standing without support x6;  no UE support with CGA for safety; cuing for full hip ext Nustep seat 9 UE 9 L3 85mins for increased endurance and low level strengthening   Gait Training around clinic min cuing needed for RW safety with good carry over                        PT Education - 03/13/21 1314    Education Details therex form/technique, HEP review    Person(s) Educated Patient    Methods Explanation;Demonstration;Verbal cues    Comprehension Verbalized understanding;Returned demonstration;Verbal cues required            PT Short Term Goals - 03/11/21 1426       PT SHORT TERM GOAL #1   Title Pt will be independent with HEP in order to improve strength and balance in order to decrease fall risk and improve function at home and in the community    Baseline 03/11/21 HEP given    Time 4    Period Weeks    Status New             PT Long Term Goals - 03/11/21 1427      PT LONG TERM GOAL #1   Title Pt will increase 10MWT to at least 1.0 m/s with  LRD in order to demonstrate safe community ambulation speed.    Baseline 03/11/21 0.63m/s    Time 8    Period Weeks    Status New      PT LONG TERM GOAL #2   Title Patient will increase FOTO score to 58 to demonstrate predicted increase in functional mobility to complete ADLs    Baseline 03/11/21 43    Time 8    Period Weeks    Status New      PT LONG TERM GOAL #3   Title Pt will demonstrate modI with all basic transfers to increase safety and independence at home    Baseline 03/11/21 modI for Stand; minA for sit; supervision for all rolling and supine <> sit transfers    Time 8    Period Weeks    Status New                 Plan - 03/13/21 1332    Clinical Impression Statement PT initiated therex progression for increased BLE and core strength and endurance with success. Patient is able to comply with all cuing for proper technique of therex, with some modifications needed for success. Patient with difficulty with eccentric control and quad activation through hip abd and SLR. Patient with fatigue with all therex, with no increased pain. PT will continue progression as able.    Personal Factors and Comorbidities Comorbidity 1;Comorbidity 2;Comorbidity 3+;Past/Current Experience;Time since onset of injury/illness/exacerbation    Comorbidities anemia, diabetic retinopathy (blind in L eye), CHF, CAD, CKD, DM2, ESRD, HLD, HTN    Examination-Activity Limitations Reach Overhead;Bed Mobility;Bathing;Dressing;Stand;Hygiene/Grooming;Lift;Carry;Squat;Sleep;Transfers    Examination-Participation  Restrictions Meal Prep;Community Activity;Laundry;Cleaning;Yard Work;Driving    Stability/Clinical Decision Making Evolving/Moderate complexity    Clinical Decision Making Moderate    Rehab Potential Good    PT Frequency 2x / week    PT Duration 8 weeks    PT Treatment/Interventions ADLs/Self Care Home Management;Stair training;Therapeutic exercise;Passive range of motion;Spinal Manipulations;Joint Manipulations;Dry needling;Taping;Manual techniques;Neuromuscular re-education;Therapeutic activities;DME Instruction;Moist Heat;Electrical Stimulation;Ultrasound;Gait training;Cryotherapy;Functional mobility training;Balance training;Patient/family education    PT Next Visit Plan HEP review, gait with RW, transfer ind    PT Home Exercise Plan bridge, SLR, sidelying hip abd, walk with RW 37mins 2-3x/day    Consulted and Agree with Plan of Care Patient           Patient will benefit from skilled therapeutic intervention in order to improve the following deficits and impairments:  Decreased endurance,Decreased balance,Decreased mobility,Difficulty walking,Increased muscle spasms,Impaired sensation,Decreased activity tolerance,Decreased coordination,Decreased strength,Increased fascial restricitons,Impaired flexibility,Postural dysfunction,Pain,Improper body mechanics,Decreased range of motion  Visit Diagnosis: Muscle weakness (generalized)     Problem List Patient Active Problem List   Diagnosis Date Noted  . Sepsis ([place]) 02/12/2021  . Acute metabolic encephalopathy 01/60/1093  . Acute respiratory failure with hypoxia ([place]) 02/12/2021  . Anemia in ESRD (end-stage renal disease) ([place]) 02/12/2021  . HLD (hyperlipidemia) 02/12/2021  . Stroke ([place]) 02/12/2021  . Hypothermia 02/12/2021  . CAP (community acquired pneumonia) 02/12/2021  . ESRD on hemodialysis ([place])   . Acute cystitis 02/06/2021  . Acute upper GI bleed 02/06/2021  . Acute blood loss anemia 02/06/2021  . COVID-19 virus infection  02/06/2021  . Gait instability 10/12/2019  . Pulmonary nodule 10/12/2019  . CAD (coronary artery disease), native coronary artery 03/08/2018  . Hyperlipidemia 03/08/2018  . Essential hypertension 03/08/2018  . Wilms' tumor with favorable history 03/01/2018  . Closed fracture of right distal radius 08/22/2016  . TIA (transient ischemic attack) 08/21/2016  . Encounter  for pre-transplant evaluation for kidney transplant 05/17/2016  . Anemia due to other cause   . Hyperkalemia   . Hypoglycemia   . Pain   . End stage renal disease ([place])   . Weakness   . Other chest pain   . Elevated troponin   . Hyperkalemia, diminished renal excretion 08/14/2015  . Hypertensive urgency 03/26/2015  . Anemia in CKD (chronic kidney disease) 12/04/2014  . Chronic kidney disease, stage IV (severe) ([place]) 12/03/2014  . Acute ischemic stroke ([place]) 08/25/2014  . Acute lacunar stroke ([place]) 06/25/2014  . Orthostatic hypotension 06/25/2014  . Diabetic vitreous hemorrhage associated with type 2 diabetes mellitus ([place]) 09/28/2013  . PDR (proliferative diabetic retinopathy) ([place]) 09/28/2013  . History of Wilms' tumor 08/15/2013  . Iron deficiency anemia 07/21/2013  . Mesenteric artery stenosis ([place]) 03/27/2013  . Heart failure, chronic systolic ([place]) 51/89/8421  . Ischemic cardiomyopathy 03/13/2013  . Pulmonary HTN ([place]) 03/13/2013  . Peripheral edema 02/28/2013  . Diabetic macular edema of both eyes ([place]) 01/16/2013  . Corneal epithelial defect 10/06/2012  . TRD (traction retinal detachment) 09/22/2012  . Vitreous hemorrhage of both eyes ([place]) 07/21/2012  . Closed right hip fracture ([place]) 02/19/2012  . Diabetic sensorimotor polyneuropathy ([place]) 01/27/2012  . Vitamin D deficiency disease 11/30/2011  . Type II diabetes mellitus with renal manifestations ([place]) 07/03/2011  . Type 2 diabetes mellitus with kidney complication, with long-term current use of insulin ([place]) 07/03/2011  . Osteoporosis with fracture  05/28/2011   Durwin Reges DPT Durwin Reges 03/13/2021, 2:17 PM  [place] [facility] [address] [place], [address] Phone: [phone]   Fax:  [phone]  Name: Richard Zavala MRN: [id number] Date of Birth: 1960/12/27

## 2021-03-14 ENCOUNTER — Other Ambulatory Visit (INDEPENDENT_AMBULATORY_CARE_PROVIDER_SITE_OTHER): Payer: Self-pay | Admitting: Internal Medicine

## 2021-03-14 DIAGNOSIS — S81809S Unspecified open wound, unspecified lower leg, sequela: Secondary | ICD-10-CM

## 2021-03-16 ENCOUNTER — Emergency Department
Admission: EM | Admit: 2021-03-16 | Discharge: 2021-03-17 | Disposition: A | Discharge status: Home / Self Care | Payer: Medicare Other | Source: Home / Self Care | Attending: Emergency Medicine | Admitting: Emergency Medicine

## 2021-03-16 ENCOUNTER — Other Ambulatory Visit: Payer: Self-pay

## 2021-03-16 ENCOUNTER — Emergency Department: Payer: Medicare Other

## 2021-03-16 ENCOUNTER — Encounter: Payer: Self-pay | Admitting: Radiology

## 2021-03-16 DIAGNOSIS — Z8616 Personal history of COVID-19: Secondary | ICD-10-CM | POA: Insufficient documentation

## 2021-03-16 DIAGNOSIS — Z992 Dependence on renal dialysis: Secondary | ICD-10-CM | POA: Insufficient documentation

## 2021-03-16 DIAGNOSIS — I5022 Chronic systolic (congestive) heart failure: Secondary | ICD-10-CM | POA: Insufficient documentation

## 2021-03-16 DIAGNOSIS — S82302A Unspecified fracture of lower end of left tibia, initial encounter for closed fracture: Secondary | ICD-10-CM | POA: Insufficient documentation

## 2021-03-16 DIAGNOSIS — E113513 Type 2 diabetes mellitus with proliferative diabetic retinopathy with macular edema, bilateral: Secondary | ICD-10-CM | POA: Insufficient documentation

## 2021-03-16 DIAGNOSIS — E1142 Type 2 diabetes mellitus with diabetic polyneuropathy: Secondary | ICD-10-CM | POA: Insufficient documentation

## 2021-03-16 DIAGNOSIS — Z87891 Personal history of nicotine dependence: Secondary | ICD-10-CM | POA: Insufficient documentation

## 2021-03-16 DIAGNOSIS — N186 End stage renal disease: Secondary | ICD-10-CM | POA: Insufficient documentation

## 2021-03-16 DIAGNOSIS — S82242A Displaced spiral fracture of shaft of left tibia, initial encounter for closed fracture: Secondary | ICD-10-CM | POA: Diagnosis not present

## 2021-03-16 DIAGNOSIS — E1122 Type 2 diabetes mellitus with diabetic chronic kidney disease: Secondary | ICD-10-CM | POA: Insufficient documentation

## 2021-03-16 DIAGNOSIS — W1849XA Other slipping, tripping and stumbling without falling, initial encounter: Secondary | ICD-10-CM | POA: Insufficient documentation

## 2021-03-16 DIAGNOSIS — I132 Hypertensive heart and chronic kidney disease with heart failure and with stage 5 chronic kidney disease, or end stage renal disease: Secondary | ICD-10-CM | POA: Insufficient documentation

## 2021-03-16 DIAGNOSIS — I2510 Atherosclerotic heart disease of native coronary artery without angina pectoris: Secondary | ICD-10-CM | POA: Insufficient documentation

## 2021-03-16 DIAGNOSIS — Z79899 Other long term (current) drug therapy: Secondary | ICD-10-CM | POA: Insufficient documentation

## 2021-03-16 DIAGNOSIS — D631 Anemia in chronic kidney disease: Secondary | ICD-10-CM | POA: Insufficient documentation

## 2021-03-16 DIAGNOSIS — Z794 Long term (current) use of insulin: Secondary | ICD-10-CM | POA: Insufficient documentation

## 2021-03-16 NOTE — ED Notes (Signed)
Pt has an abrasion to LLE. States he fell out of bed

## 2021-03-16 NOTE — ED Triage Notes (Signed)
Pt states he slipped getting into bed around 2130 today injuring left tibial area. Pt denies head injury

## 2021-03-16 NOTE — ED Notes (Signed)
Pt moved to bed by 3 nurse. Pt unable to ambulate or move lower extremities. Pt states he is unable to walk due to his legs being so painful.

## 2021-03-17 MED ORDER — HYDROCODONE-ACETAMINOPHEN 5-325 MG PO TABS: 2.0000 | ORAL_TABLET | Freq: Four times a day (QID) | ORAL | 0 refills | Status: DC | PRN

## 2021-03-17 MED ORDER — OXYCODONE-ACETAMINOPHEN 5-325 MG PO TABS
2.0000 | ORAL_TABLET | Freq: Once | ORAL | Status: AC
  Administered 2021-03-17: 2 via ORAL
  Filled 2021-03-17: qty 2

## 2021-03-17 NOTE — Progress Notes (Signed)
Richard Zavala, Richard Zavala ([id number]) Visit Report for 03/12/2021 Allergy List Details Patient Name: Richard Zavala, Richard Zavala. Date of Service: 03/12/2021 2:00 PM Medical Record Number: [id number] Patient Account Number: [id number] Date of Birth/Sex: 1961/02/28 (60 y.o. M) Treating RN: Richard Zavala Primary Care Richard Zavala: Richard Zavala Other Clinician: Jeanine Zavala Referring Richard Zavala: Richard Zavala Treating Richard Zavala: Richard Zavala: 0 Allergies Active Allergies metformin penicillin Allergy Notes Electronic Signature(s) Signed: 03/17/2021 5:08:01 PM By: Richard Coria RN Entered By: Richard Zavala on 03/12/2021 14:51:31 Esteve, Richard Zavala ([id number]) -------------------------------------------------------------------------------- Arrival Information Details Patient Name: Richard Zavala Date of Service: 03/12/2021 2:00 PM Medical Record Number: [id number] Patient Account Number: [id number] Date of Birth/Sex: Apr 24, 1961 (60 y.o. M) Treating RN: Richard Zavala Primary Care Richard Zavala: Richard Zavala Other Clinician: Jeanine Zavala Referring Richard Zavala: Richard Zavala Treating Richard Zavala: Richard Zavala in Zavala: 0 Visit Information Patient Arrived: Wheel Chair Arrival Time: 14:17 Accompanied By: daughter Transfer Assistance: None Patient Identification Verified: Yes Secondary Verification Process Completed: Yes Patient Requires Transmission-Based Precautions: No Patient Has Alerts: No Electronic Signature(s) Signed: 03/17/2021 5:08:01 PM By: Richard Coria RN Entered By: Richard Zavala on 03/12/2021 14:23:46 Richard Zavala, Richard Zavala ([id number]) -------------------------------------------------------------------------------- Clinic Level of Care Assessment Details Patient Name: Richard Zavala Date of Service: 03/12/2021 2:00 PM Medical Record Number: [id number] Patient Account Number: [id number] Date of Birth/Sex: [birth year] (60 y.o. M) Treating RN: Richard Zavala Primary Care Karrine Kluttz: Richard Zavala Other Clinician: Jeanine Zavala Referring Binta Statzer: Richard Zavala Treating Richard Zavala: Richard Zavala in Zavala: 0 Clinic Level of Care Assessment Items TOOL 1 Quantity Score []  - Use when EandM and Procedure is performed on INITIAL visit 0 ASSESSMENTS - Nursing Assessment / Reassessment X - General Physical Exam (combine w/ comprehensive assessment (listed just below) when performed on new 1 20 pt. evals) X- 1 25 Comprehensive Assessment (HX, ROS, Risk Assessments, Wounds Hx, etc.) ASSESSMENTS - Wound and Skin Assessment / Reassessment []  - Dermatologic / Skin Assessment (not related to wound area) 0 ASSESSMENTS - Ostomy and/or Continence Assessment and Care []  - Incontinence Assessment and Management 0 []  - 0 Ostomy Care Assessment and Management (repouching, etc.) PROCESS - Coordination of Care X - Simple Patient / Family Education for ongoing care 1 15 []  - 0 Complex (extensive) Patient / Family Education for ongoing care X- 1 10 Staff obtains Consents, Records, Test Results / Process Orders []  - 0 Staff telephones HHA, Nursing Homes / Clarify orders / etc []  - 0 Routine Transfer to another Facility (non-emergent condition) []  - 0 Routine Hospital Admission (non-emergent condition) X- 1 15 New Admissions / Biomedical engineer / Ordering NPWT, Apligraf, etc. []  - 0 Emergency Hospital Admission (emergent condition) PROCESS - Special Needs []  - Pediatric / Minor Patient Management 0 []  - 0 Isolation Patient Management []  - 0 Hearing / Language / Visual special needs []  - 0 Assessment of Community assistance (transportation, D/C planning, etc.) []  - 0 Additional assistance / Altered mentation []  - 0 Support Surface(s) Assessment (bed, cushion, seat, etc.) INTERVENTIONS - Miscellaneous []  - External ear exam 0 []  - 0 Patient Transfer (multiple staff / Civil Service fast streamer / Similar devices) []  - 0 Simple  Staple / Suture removal (25 or less) []  - 0 Complex Staple / Suture removal (26 or more) []  - 0 Hypo/Hyperglycemic Management (do not check if billed separately) []  - 0 Ankle / Brachial Index (ABI) - do not check if billed separately Has the patient been seen at the hospital within the last three  years: Yes Total Score: 85 Level Of Care: New/Established - Level 3 AHMAUD, DUTHIE ([id number]) Electronic Signature(s) Signed: 03/12/2021 6:23:16 PM By: Richard Cool, BSN, RN, CWS, Richard Zavala Entered By: Richard Zavala on 03/12/2021 17:12:52 Richard Zavala, Richard Zavala ([id number]) -------------------------------------------------------------------------------- Encounter Discharge Information Details Patient Name: Richard Zavala, [name] Date of Service: 03/12/2021 2:00 PM Medical Record Number: [id number] Patient Account Number: [id number] Date of Birth/Sex: 1961-06-20 (60 y.o. M) Treating RN: Richard Zavala Primary Care Richard Zavala: Richard Zavala Other Clinician: Jeanine Zavala Referring Richard Zavala: Richard Zavala Treating Richard Zavala: Richard Zavala in Zavala: 0 Encounter Discharge Information Items Post Procedure Vitals Discharge Condition: Stable Unable to obtain vitals Reason: . Ambulatory Status: Ambulatory Discharge Destination: Home Transportation: Private Auto Accompanied By: self Schedule Follow-up Appointment: Yes Clinical Summary of Care: Electronic Signature(s) Signed: 03/12/2021 5:01:28 PM By: Richard Cool, BSN, RN, CWS, Richard Zavala Entered By: Richard Zavala on 03/12/2021 17:01:28 Richard Zavala ([id number]) -------------------------------------------------------------------------------- Lower Extremity Assessment Details Patient Name: Richard Zavala, Richard Zavala Date of Service: 03/12/2021 2:00 PM Medical Record Number: [id number] Patient Account Number: [id number] Date of Birth/Sex: 08/21/1961 (60 y.o. M) Treating RN: Richard Zavala Primary Care Traeh Milroy: Richard Zavala Other Clinician: Jeanine Zavala Referring Richard Zavala: Richard Zavala Treating Richard Zavala: Richard Zavala in Zavala: 0 Edema Assessment Assessed: [Left: No] [Right: No] Edema: [Left: Yes] [Right: Yes] Calf Left: Right: Point of Measurement: 35 cm From Medial Instep 28.1 cm 27.2 cm Ankle Left: Right: Point of Measurement: 10 cm From Medial Instep 23.6 cm 21.5 cm Vascular Assessment Pulses: Dorsalis Pedis Palpable: [Left:Yes] [Right:Yes] Notes non compressable Electronic Signature(s) Signed: 03/17/2021 5:08:01 PM By: Richard Coria RN Entered By: Richard Zavala on 03/12/2021 14:48:19 Richard Zavala, Richard Zavala ([id number]) -------------------------------------------------------------------------------- Multi Wound Chart Details Patient Name: Richard Zavala Date of Service: 03/12/2021 2:00 PM Medical Record Number: [id number] Patient Account Number: [id number] Date of Birth/Sex: February 10, 1961 (60 y.o. 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 N/A Extremity Extremity Comorbid History: Congestive Heart Failure, Coronary Congestive Heart Failure, Coronary N/A Artery Disease, Hypertension, Type Artery Disease, Hypertension, Type II Diabetes II Diabetes Date Acquired: 02/18/2021 02/18/2021 N/A Richard of Zavala: 0 0 N/A Wound Status: Open Open N/A Measurements L x W x D (cm) 6x1x0.1 4x2.5x0.1 N/A Area (cm) : 4.712 7.854 N/A Volume (cm)  : 0.471 0.785 N/A Classification: Grade 2 Grade 2 N/A Exudate Amount: Medium Medium N/A Exudate Type: Sanguinous Serosanguineous N/A Exudate Color: red red, brown N/A Granulation Amount: Medium (34-66%) Small (1-33%) N/A Granulation Quality: Red N/A N/A Necrotic Amount: Medium (34-66%) Large (67-100%) N/A Necrotic Tissue: Eschar, Adherent [place], Adherent Slough N/A Exposed Structures: Fat Layer (Subcutaneous Tissue): Fat Layer (Subcutaneous Tissue): N/A Yes Yes Fascia: No Fascia: No Tendon: No Tendon: No Muscle: No Muscle: No Joint: No Joint: No Bone: No Bone: No Epithelialization: None None N/A Debridement: Debridement - Excisional Debridement - Excisional N/A Pre-procedure Verification/Time 15:20 15:20 N/A Out Taken: Tissue Debrided: Necrotic/Eschar, Subcutaneous, Necrotic/Eschar, Subcutaneous, N/A Slough Slough Level: Skin/Subcutaneous Tissue Skin/Subcutaneous Tissue N/A Debridement Area (sq cm): 6 10 N/A Instrument: Curette Curette N/A Bleeding: Moderate Moderate N/A  Hemostasis Achieved: Pressure Pressure N/A Debridement Zavala Procedure was tolerated well Procedure was tolerated well N/A Response: Post Debridement 6x1x0.8 4x2.5x0.2 N/A Measurements L x W x D (cm) ADRIN, JULIAN ([id number]) Post Debridement Volume: 3.77 1.571 N/A (cm) Procedures Performed: Debridement Debridement N/A Zavala Notes Electronic Signature(s) Signed: 03/12/2021 5:15:48 PM By: Linton Ham MD Entered By: Linton Ham on 03/12/2021 15:51:14 Hoak, Richard Zavala ([id number]) -------------------------------------------------------------------------------- [place] Details Patient Name: Richard Zavala, Richard Zavala Date of Service: 03/12/2021 2:00 PM Medical Record Number: [id number] Patient Account Number: [id number] Date of Birth/Sex: [birth year] (60 y.o. M) Treating RN: Richard Zavala Primary Care Marcos Peloso: Richard Zavala Other Clinician: Jeanine Zavala Referring Major Santerre: Richard Zavala Treating Quintara Bost/Extender: Richard Zavala in Zavala: 0 Active Inactive Necrotic Tissue Nursing Diagnoses: Impaired tissue integrity related to necrotic/devitalized tissue Goals: Necrotic/devitalized tissue will be minimized in the wound bed Date Initiated: 03/12/2021 Target Resolution Date: 03/19/2021 Goal Status: Active Interventions: Assess patient pain level pre-, during and post procedure and prior to discharge Zavala Activities: Excisional debridement : 03/12/2021 Notes: Orientation to the Wound Care Program Nursing Diagnoses: Knowledge deficit related to the wound healing center program Goals: Patient/caregiver will verbalize understanding of the [place] Date Initiated: 03/12/2021 Target Resolution Date: 03/19/2021 Goal Status: Active Interventions: Provide education on orientation to the wound center Notes: Peripheral Neuropathy Nursing Diagnoses: Knowledge deficit related to disease process and management of peripheral neurovascular dysfunction Potential alteration in peripheral tissue perfusion (select prior to confirmation of diagnosis) Goals: Patient/caregiver will verbalize understanding of disease process and disease management Date Initiated: 03/12/2021 Target Resolution Date: 03/19/2021 Goal Status: Active Interventions: Assess signs and symptoms of neuropathy upon admission and as needed Notes: Venous Leg Ulcer Nursing Diagnoses: Actual venous Insuffiency (use after diagnosis is confirmed) GoalsKIANO, TERRIEN ([id number]) Patient will maintain optimal edema control Date Initiated: 03/12/2021 Target Resolution Date: 03/19/2021 Goal Status: Active Interventions: Assess peripheral edema status every visit. Compression as ordered Zavala Activities: Non-invasive vascular studies : 03/12/2021 Notes: Wound/Skin Impairment Nursing Diagnoses: Impaired tissue  integrity Goals: Patient/caregiver will verbalize understanding of skin care regimen Date Initiated: 03/12/2021 Target Resolution Date: 03/12/2021 Goal Status: Active Ulcer/skin breakdown will have a volume reduction of 30% by week 4 Date Initiated: 03/12/2021 Target Resolution Date: 04/12/2021 Goal Status: Active Interventions: Assess ulceration(s) every visit Zavala Activities: Skin care regimen initiated : 03/12/2021 Topical wound management initiated : 03/12/2021 Notes: Electronic Signature(s) Signed: 03/12/2021 6:23:16 PM By: Richard Cool, BSN, RN, CWS, Richard Zavala Entered By: Richard Zavala on 03/12/2021 15:24:33 Richard Zavala, Richard Zavala ([id number]) -------------------------------------------------------------------------------- Pain Assessment Details Patient Name: Richard Zavala Date of Service: 03/12/2021 2:00 PM Medical Record Number: [id number] Patient Account Number: [id number] Date of Birth/Sex: 1961-03-10 (60 y.o. M) Treating RN: Richard Zavala Primary Care Siedah Sedor: Richard Zavala Other Clinician: Jeanine Zavala Referring Larsen Zettel: Richard Zavala Treating Kalkidan Caudell/Extender: Richard Zavala in Zavala: 0 Active Problems Location of Pain Severity and Description of Pain Patient Has Paino No Site Locations Pain Management and Medication Current Pain Management: Electronic Signature(s) Signed: 03/17/2021 5:08:01 PM By: Richard Coria RN Entered By: Richard Zavala on 03/12/2021 14:24:14 Richard Zavala, Richard Zavala ([id number]) -------------------------------------------------------------------------------- Patient/Caregiver Education Details Patient Name: Richard Zavala Date of Service: 03/12/2021 2:00 PM Medical Record Number: [id number] Patient Account Number: [id number] Date of Birth/Gender: 22-Apr-1961 (60 y.o. M) Treating RN: Richard Zavala Primary Care Physician: Richard Zavala Other Clinician: Jeanine Zavala Referring Physician: Grayland Zavala Treating  Physician/Extender: Richard Zavala in Zavala: 0 Education Assessment Education Provided  To: Patient Education Topics Provided Wound/Skin Impairment: Handouts: Caring for Your Ulcer Methods: Demonstration, Explain/Verbal Responses: State content correctly Electronic Signature(s) Signed: 03/12/2021 6:23:16 PM By: Richard Cool, BSN, RN, CWS, Richard Zavala Entered By: Richard Zavala on 03/12/2021 17:13:17 Richard Zavala, Richard Zavala ([id number]) -------------------------------------------------------------------------------- Wound Assessment Details Patient Name: Richard Zavala, Richard Zavala Date of Service: 03/12/2021 2:00 PM Medical Record Number: [id number] Patient Account Number: [id number] Date of Birth/Sex: [birth year] (60 y.o. M) Treating RN: Richard Zavala Primary Care Lelah Rennaker: Richard Zavala Other Clinician: Jeanine Zavala Referring Marializ Ferrebee: Richard Zavala Treating Calistro Rauf/Extender: Richard Zavala in Zavala: 0 Wound Status Wound Number: 1 Primary Diabetic Wound/Ulcer of the Lower Extremity Etiology: Wound Location: Right, Dorsal Foot Wound Status: Open Wounding Event: Gradually Appeared Comorbid Congestive Heart Failure, Coronary Artery Disease, Date Acquired: 02/18/2021 History: Hypertension, Type II Diabetes Richard Of Zavala: 0 Clustered Wound: No Photos Wound Measurements Length: (cm) 6 Width: (cm) 1 Depth: (cm) 0.1 Area: (cm) 4.712 Volume: (cm) 0.471 % Reduction in Area: 0% % Reduction in Volume: 0% Epithelialization: None Tunneling: No Undermining: No Wound Description Classification: Grade 2 Exudate Amount: Medium Exudate Type: Sanguinous Exudate Color: red Foul Odor After Cleansing: No Slough/Fibrino Yes Wound Bed Granulation Amount: Medium (34-66%) Exposed Structure Granulation Quality: Red Fascia Exposed: No Necrotic Amount: Medium (34-66%) Fat Layer (Subcutaneous Tissue) Exposed: Yes Necrotic Quality: Eschar, Adherent Slough Tendon  Exposed: No Muscle Exposed: No Joint Exposed: No Bone Exposed: No Zavala Notes Wound #1 (Foot) Wound Laterality: Dorsal, Right Cleanser Soap and Water Discharge Instruction: Gently cleanse wound with antibacterial soap, rinse and pat dry prior to dressing wounds Peri-Wound Care ELERY, CADENHEAD ([id number]) Topical Primary Dressing Silvercel 4 1/4x 4 1/4 (in/in) Discharge Instruction: Apply Silvercel 4 1/4x 4 1/4 (in/in) as instructed Secondary Dressing ABD Pad 5x9 (in/in) Discharge Instruction: Cover with ABD pad Secured With 51M Medipore H Soft Cloth Surgical Tape, 2x2 (in/yd) Conforming Stretch Gauze Bandage 4x75 (in/in) Discharge Instruction: Apply as directed Compression Wrap Compression Stockings Add-Ons Electronic Signature(s) Signed: 03/17/2021 5:08:01 PM By: Richard Coria RN Entered By: Richard Zavala on 03/12/2021 16:36:14 [place], Richard Zavala ([id number]) -------------------------------------------------------------------------------- Wound Assessment Details Patient Name: Richard Zavala Date of Service: 03/12/2021 2:00 PM Medical Record Number: [id number] Patient Account Number: [id number] Date of Birth/Sex: 01/17/1961 (60 y.o. M) Treating RN: Richard Zavala Primary Care Jancarlos Thrun: Richard Zavala Other Clinician: Jeanine Zavala Referring Regena Delucchi: Richard Zavala Treating Janaria Mccammon/Extender: Richard Zavala in Zavala: 0 Wound Status Wound Number: 2 Primary Diabetic Wound/Ulcer of the Lower Extremity Etiology: Wound Location: Left Lower Leg Wound Status: Open Wounding Event: Gradually Appeared Comorbid Congestive Heart Failure, Coronary Artery Disease, Date Acquired: 02/18/2021 History: Hypertension, Type II Diabetes Richard Of Zavala: 0 Clustered Wound: No Photos Wound Measurements Length: (cm) 4 Width: (cm) 2.5 Depth: (cm) 0.1 Area: (cm) 7.854 Volume: (cm) 0.785 % Reduction in Area: % Reduction in Volume: Epithelialization: None Tunneling:  No Undermining: No Wound Description Classification: Grade 2 Exudate Amount: Medium Exudate Type: Serosanguineous Exudate Color: red, brown Foul Odor After Cleansing: No Slough/Fibrino Yes Wound Bed Granulation Amount: Small (1-33%) Exposed Structure Necrotic Amount: Large (67-100%) Fascia Exposed: No Necrotic Quality: Eschar, Adherent Slough Fat Layer (Subcutaneous Tissue) Exposed: Yes Tendon Exposed: No Muscle Exposed: No Joint Exposed: No Bone Exposed: No Zavala Notes Wound #2 (Lower Leg) Wound Laterality: Left Cleanser Soap and Water Discharge Instruction: Gently cleanse wound with antibacterial soap, rinse and pat dry prior to dressing wounds Peri-Wound Care CONARD, ALVIRA ([id number]) Topical Primary Dressing Silvercel 4 1/4x 4  1/4 (in/in) Discharge Instruction: Apply Silvercel 4 1/4x 4 1/4 (in/in) as instructed Secondary Dressing ABD Pad 5x9 (in/in) Discharge Instruction: Cover with ABD pad Secured With [place] Surgical Tape, 2x2 (in/yd) Conforming Stretch Gauze Bandage 4x75 (in/in) Discharge Instruction: Apply as directed Compression Wrap Compression Stockings Add-Ons Electronic Signature(s) Signed: 03/17/2021 5:08:01 PM By: Richard Coria RN Entered By: Richard Zavala on 03/12/2021 14:47:16 [place], Richard Zavala ([id number]) -------------------------------------------------------------------------------- [place] Details Patient Name: Richard Zavala Date of Service: 03/12/2021 2:00 PM Medical Record Number: [id number] Patient Account Number: [id number] Date of Birth/Sex: 03-02-1961 (60 y.o. M) Treating RN: Richard Zavala Primary Care Lucindia Lemley: Richard Zavala Other Clinician: Jeanine Zavala Referring Karsen Fellows: Richard Zavala Treating Tierre Gerard/Extender: Richard Zavala in Zavala: 0 Vital Signs Time Taken: 14:24 Temperature (F): 97.8 Height (in): 68 Pulse (bpm): 71 Source: Stated Respiratory Rate (breaths/min): 18 Weight  (lbs): 158 Reference Range: 80 - 120 mg / dl Source: Stated Body Mass Index (BMI): 24 Electronic Signature(s) Signed: 03/17/2021 5:08:01 PM By: Richard Coria RN Entered By: Richard Zavala on 03/12/2021 14:25:29

## 2021-03-17 NOTE — ED Notes (Signed)
Pt refuses crutches

## 2021-03-17 NOTE — ED Provider Notes (Signed)
[facility] Emergency Department Provider Note  ____________________________________________   Event Date/Time   First MD Initiated Contact with Patient 03/16/21 2258     (approximate)  I have reviewed the triage vital signs and the nursing notes.   HISTORY  Chief Complaint Leg Injury    HPI Richard Zavala is a 60 y.o. male with medical history as listed below who presents for evaluation of a fall with acute onset pain in his left lower leg.  Patient says he got tripped up by his bed and somehow he twisted and fell and had his leg trapped underneath the bed.  He has mild pain in his lower shin with a little bit of swelling, worse when he tries to move.  No pain in his foot, knee, or thigh.  He has a chronic wound on his right foot and he said that he sees somebody about it and they recently peeled off the scab.  It feels the same as usual.  He did not strike his head, did not lose consciousness, and reports no  pain in his head or his neck or his back.  Onset of the fall was acute, nothing in particular made it better or worse.        Past Medical History:  Diagnosis Date  . Anemia   . Blind   . BPH (benign prostatic hyperplasia)   . CAD (coronary artery disease)   . CHF (congestive heart failure) ([place])   . Chronic kidney disease (CKD), stage IV (severe) (HCC)    DIALYSIS  . Diabetes mellitus without complication ([place])   . ESRD (end stage renal disease) ([place])    Monday-Wednesday-Friday dialysis  . Hyperlipemia   . Hypertension   . Neuropathy   . Osteoporosis   . Pulmonary HTN ([place])   . Stroke [facility])    2013  . TIA (transient ischemic attack)   . TRD (traction retinal detachment)   . Wilms' tumor [facility])     Patient Active Problem List   Diagnosis Date Noted  . Sepsis ([place]) 02/12/2021  . Acute metabolic encephalopathy 85/01/7740  . Acute respiratory failure with hypoxia ([place]) 02/12/2021  . Anemia in ESRD (end-stage renal disease) ([place])  02/12/2021  . HLD (hyperlipidemia) 02/12/2021  . Stroke ([place]) 02/12/2021  . Hypothermia 02/12/2021  . CAP (community acquired pneumonia) 02/12/2021  . ESRD on hemodialysis ([place])   . Acute cystitis 02/06/2021  . Acute upper GI bleed 02/06/2021  . Acute blood loss anemia 02/06/2021  . COVID-19 virus infection 02/06/2021  . Gait instability 10/12/2019  . Pulmonary nodule 10/12/2019  . CAD (coronary artery disease), native coronary artery 03/08/2018  . Hyperlipidemia 03/08/2018  . Essential hypertension 03/08/2018  . Wilms' tumor with favorable history 03/01/2018  . Closed fracture of right distal radius 08/22/2016  . TIA (transient ischemic attack) 08/21/2016  . Encounter for pre-transplant evaluation for kidney transplant 05/17/2016  . Anemia due to other cause   . Hyperkalemia   . Hypoglycemia   . Pain   . End stage renal disease ([place])   . Weakness   . Other chest pain   . Elevated troponin   . Hyperkalemia, diminished renal excretion 08/14/2015  . Hypertensive urgency 03/26/2015  . Anemia in CKD (chronic kidney disease) 12/04/2014  . Chronic kidney disease, stage IV (severe) ([place]) 12/03/2014  . Acute ischemic stroke ([place]) 08/25/2014  . Acute lacunar stroke ([place]) 06/25/2014  . Orthostatic hypotension 06/25/2014  . Diabetic vitreous hemorrhage associated with type 2  diabetes mellitus ([place]) 09/28/2013  . PDR (proliferative diabetic retinopathy) ([place]) 09/28/2013  . History of Wilms' tumor 08/15/2013  . Iron deficiency anemia 07/21/2013  . Mesenteric artery stenosis ([place]) 03/27/2013  . Heart failure, chronic systolic ([place]) 16/09/9603  . Ischemic cardiomyopathy 03/13/2013  . Pulmonary HTN ([place]) 03/13/2013  . Peripheral edema 02/28/2013  . Diabetic macular edema of both eyes ([place]) 01/16/2013  . Corneal epithelial defect 10/06/2012  . TRD (traction retinal detachment) 09/22/2012  . Vitreous hemorrhage of both eyes ([place]) 07/21/2012  . Closed right hip fracture ([place]) 02/19/2012   . Diabetic sensorimotor polyneuropathy ([place]) 01/27/2012  . Vitamin D deficiency disease 11/30/2011  . Type II diabetes mellitus with renal manifestations ([place]) 07/03/2011  . Type 2 diabetes mellitus with kidney complication, with long-term current use of insulin ([place]) 07/03/2011  . Osteoporosis with fracture 05/28/2011    Past Surgical History:  Procedure Laterality Date  . A/V FISTULAGRAM Left 01/28/2017   Procedure: A/V Fistulagram;  Surgeon: Algernon Huxley, MD;  Location: [place] [facility];  Service: Cardiovascular;  Laterality: Left;  . A/V FISTULAGRAM Left 04/28/2018   Procedure: A/V FISTULAGRAM;  Surgeon: Algernon Huxley, MD;  Location: [place] [facility];  Service: Cardiovascular;  Laterality: Left;  . A/V FISTULAGRAM N/A 03/21/2020   Procedure: A/V FISTULAGRAM;  Surgeon: Algernon Huxley, MD;  Location: [place] [facility];  Service: Cardiovascular;  Laterality: N/A;  . AV FISTULA PLACEMENT    . AV FISTULA PLACEMENT Right 11/23/2019   Procedure: ARTERIOVENOUS (AV) FISTULA CREATION (RADIOCEPHALIC );  Surgeon: Algernon Huxley, MD;  Location: [facility] OR[facility];  Service: Vascular;  Laterality: Right;  . CARDIAC CATHETERIZATION    . CATARACT EXTRACTION W/PHACO Right 05/14/2016   Procedure: CATARACT EXTRACTION PHACO AND INTRAOCULAR LENS PLACEMENT (IOC);  Surgeon: Eulogio Bear, MD;  Location: [facility] OR[facility];  Service: Ophthalmology;  Laterality: Right;  [place] 1.46 AP% 11.5 CDE 12.33 FLUID PACK LOT # [id number]  . EYE SURGERY    . FRACTURE SURGERY     RIGHT LEG WITH ROD  . HIP SURGERY    . NEPHRECTOMY RADICAL    . PERIPHERAL VASCULAR CATHETERIZATION N/A 08/28/2015   Procedure: A/V Shuntogram/Fistulagram;  Surgeon: Algernon Huxley, MD;  Location: [place] [facility];  Service: Cardiovascular;  Laterality: N/A;  . PERIPHERAL VASCULAR CATHETERIZATION Left 08/28/2015   Procedure: A/V Shunt Intervention;  Surgeon: Algernon Huxley, MD;  Location: [place] [facility];  Service: Cardiovascular;  Laterality: Left;  . UPPER  EXTREMITY ANGIOGRAPHY Right 08/01/2020   Procedure: UPPER EXTREMITY ANGIOGRAPHY;  Surgeon: Algernon Huxley, MD;  Location: [place] [facility];  Service: Cardiovascular;  Laterality: Right;    Prior to Admission medications   Medication Sig Start Date End Date Taking? Authorizing Provider  HYDROcodone-acetaminophen (NORCO/VICODIN) 5-325 MG tablet Take 2 tablets by mouth every 6 (six) hours as needed for moderate pain or severe pain. 03/17/21  Yes Hinda Kehr, MD  amLODipine (NORVASC) 10 MG tablet Hold until followup with outpatient doctor due to intermittent low blood pressure. 02/16/21   Enzo Bi, MD  atorvastatin (LIPITOR) 80 MG tablet Take 1 tablet (80 mg total) by mouth at bedtime. 02/08/21   Enzo Bi, MD  carvedilol (COREG) 12.5 MG tablet Take 1 tablet (12.5 mg total) by mouth 2 (two) times daily. 02/16/21   Enzo Bi, MD  epoetin alfa (EPOGEN) 10000 UNIT/ML injection Inject 0.8 mLs (8,000 Units total) into the vein Every Tuesday,Thursday,and Saturday with dialysis. 02/16/21   Enzo Bi, MD  furosemide (LASIX) 40 MG tablet Hold until followup with outpatient doctor due to intermittent low blood pressure. 02/16/21   Enzo Bi, MD  guaiFENesin-dextromethorphan (ROBITUSSIN DM) 100-10 MG/5ML syrup Take 10 mLs by mouth every 6 (six) hours as needed for cough. 02/16/21   Enzo Bi, MD  hydrALAZINE (APRESOLINE) 100 MG tablet Hold until followup with outpatient doctor due to intermittent low blood pressure. 02/16/21   Enzo Bi, MD  insulin glargine (LANTUS) 100 UNIT/ML injection Inject 9-10 Units into the skin daily.     [provider]  isosorbide mononitrate (IMDUR) 30 MG 24 hr tablet Hold until followup with outpatient doctor due to intermittent low blood pressure. 02/16/21   Enzo Bi, MD  lidocaine (LIDODERM) 5 % Place 1 patch onto the skin daily. 11/04/20   [provider]  pantoprazole (PROTONIX) 40 MG tablet Take 40 mg by mouth 2 (two) times daily before a meal. 01/14/21   [provider]    Allergies Metformin and Penicillins  Family History  Problem Relation Age of Onset  . CAD Father   . COPD Father   . CAD Paternal Grandfather   . Diabetes Maternal Aunt   . Diabetes Maternal Uncle     Social History Social History   Tobacco Use  . Smoking status: Former Smoker    Packs/day: 1.00    Types: Cigarettes    Quit date: 06/26/2012    Years since quitting: 8.7  . Smokeless tobacco: Never Used  Vaping Use  . Vaping Use: Never used  Substance Use Topics  . Alcohol use: No  . Drug use: No    Review of Systems Constitutional: No fever/chills Eyes: No visual changes. ENT: No sore throat. Cardiovascular: Denies chest pain. Respiratory: Denies shortness of breath. Gastrointestinal: No abdominal pain.   Musculoskeletal: Pain and swelling in left lower leg. Neurological: Negative for headaches, focal weakness or numbness.   ____________________________________________   PHYSICAL EXAM:  VITAL SIGNS: ED Triage Vitals  Enc Vitals Group     BP 03/16/21 2227 (!) 144/85     Pulse Rate 03/16/21 2225 64     Resp 03/16/21 2225 18     Temp 03/16/21 2225 98 F (36.7 C)     Temp Source 03/16/21 2225 Oral     SpO2 03/16/21 2225 94 %     Weight 03/16/21 2219 71.8 kg (158 lb 4.6 oz)     Height 03/16/21 2219 1.727 m (5\' 8" )     Head Circumference --      Peak Flow --      Pain Score 03/16/21 2218 1     Pain Loc --      Pain Edu? --      Excl. in [place]? --     Constitutional: Alert and oriented.  Eyes: Right eye is normal.  Left eye is chronically blind and opaque. Head: Atraumatic. Cardiovascular: Normal rate, regular rhythm. Good peripheral circulation. Respiratory: Normal respiratory effort.  No retractions. Gastrointestinal: Soft and nondistended. Musculoskeletal: Mild edema and tenderness to palpation of the left anterior lower leg consistent with radiographic evidence of fracture of the tibia. Neurologic:  Normal speech and language. No  gross focal neurologic deficits are appreciated.  Skin:  Skin is warm and dry.  He has multiple old wounds that have scabbed over on his left anterior leg.  He also has what appears to be a chronic wound on the top of his right foot that appears to have been recently debrided or deroofed.  He has 2 small abrasions which could be acute on the top of his right foot as well near the big toe.   ____________________________________________    RADIOLOGY I, Hinda Kehr, personally viewed and evaluated these images (plain radiographs) as part of my medical decision making, as well as reviewing the written report by the radiologist.  ED MD interpretation: Displaced spiral fracture of the distal tibia  Official radiology report(s): DG Tibia/Fibula Left  Result Date: 03/16/2021 CLINICAL DATA:  Left lower leg injury. Slipped getting into bed today. EXAM: LEFT TIBIA AND FIBULA - 2 VIEW COMPARISON:  None. FINDINGS: Mildly displaced spiral fracture of the distal tibial metadiaphysis. Lateral displacement of 1-2 mm of the distal fracture fragment. No intra-articular component is visualized. Proximal fibular shaft fracture is remote. No acute fibular fracture. Knee and ankle alignment are maintained. The bones are diffusely under mineralized. There is soft tissue edema at the fracture site anteriorly, and to a lesser extent diffusely throughout the lower leg. IMPRESSION: 1. Mildly displaced spiral fracture of the distal tibial metadiaphysis. No intra-articular component visualized by radiograph. 2. Proximal fibular shaft fracture is healed/remote. Electronically Signed   By: Keith Rake M.D.   On: 03/16/2021 23:29    ____________________________________________   PROCEDURES   Procedure(s) performed (including Critical Care):  Procedures   ____________________________________________   INITIAL IMPRESSION / MDM / [place] / ED COURSE  As part of my medical decision making, I reviewed  the following data within the [facility] notes reviewed and incorporated, Old chart reviewed, Radiograph reviewed , Discussed with orthopedics (Dr. Mack Guise) and reviewed Notes from prior ED visits   Differential diagnosis includes, but is not limited to, fracture, dislocation, contusion, associated injury, electrolyte or metabolic abnormality.  I personally reviewed the patient's imaging and agree with the radiologist's interpretation that the patient has a mildly displaced spiral fracture of the distal tibia.  He has no physical exam findings that would suggest an associated injury with his knee, distal femur, etc.  No involvement of his ankle or foot.  He has chronic wounds of his right foot but does not appear to be anything acute.  He says that he goes to a doctor (wound care) regarding the right foot and it does not seem to affect the current presentation.  I will discussed the case by phone with orthopedics but anticipate discharge and outpatient follow-up.  His son spoke with his nurse, Clarene Critchley, and said that he would be able to assist his father with his outpatient management including getting him to and from dialysis.  The patient says he is willing to try the crutches and he says that he knows he should not be bearing weight on the affected leg.     Clinical Course as of 03/17/21 0121  Mon Mar 17, 2021  0117 Discussed case by phone with Dr. Mack Guise.  He agreed with the plan for outpatient management and follow-up, short leg splint and crutches use should be fine. [CF]    Clinical Course User Index [CF] Hinda Kehr, MD     ____________________________________________  FINAL CLINICAL IMPRESSION(S) / ED DIAGNOSES  Final diagnoses:  Closed fracture of distal end of left tibia, unspecified fracture morphology, initial encounter     MEDICATIONS GIVEN DURING THIS VISIT:  Medications  oxyCODONE-acetaminophen (PERCOCET/ROXICET) 5-325 MG per tablet 2  tablet (2 tablets Oral Given 03/17/21 0022)     ED Discharge Orders         Ordered  HYDROcodone-acetaminophen (NORCO/VICODIN) 5-325 MG tablet  Every 6 hours PRN        03/17/21 0120          *Please note:  TACARI REPASS was evaluated in Emergency Department on 03/17/2021 for the symptoms described in the history of present illness. He was evaluated in the context of the global COVID-19 pandemic, which necessitated consideration that the patient might be at risk for infection with the SARS-CoV-2 virus that causes COVID-19. Institutional protocols and algorithms that pertain to the evaluation of patients at risk for COVID-19 are in a state of rapid change based on information released by regulatory bodies including the CDC and federal and state organizations. These policies and algorithms were followed during the patient's care in the ED.  Some ED evaluations and interventions may be delayed as a result of limited staffing during and after the pandemic.*  Note:  This document was prepared using Dragon voice recognition software and may include unintentional dictation errors.   Hinda Kehr, MD 03/17/21 [phone]

## 2021-03-17 NOTE — ED Notes (Signed)
Pt refused to sign for discharge instructions

## 2021-03-17 NOTE — Progress Notes (Signed)
RAYFORD, WILLIAMSEN ([id number]) Visit Report for 03/12/2021 Chief Complaint Document Details Patient Name: Richard Zavala, Richard Zavala. Date of Service: 03/12/2021 2:00 PM Medical Record Number: [id number] Patient Account Number: [id number] Date of Birth/Sex: 1961/01/08 (60 y.o. M) Treating RN: Cornell Barman Primary Care Provider: Lethea Killings Other Clinician: Jeanine Luz Referring Provider: Grayland Ormond Treating Provider/Extender: Tito Dine in Treatment: 0 Information Obtained from: Patient Chief Complaint 03/12/2021; patient is here for wounds on his left anterior lower leg and right dorsal foot Electronic Signature(s) Signed: 03/12/2021 5:15:48 PM By: Linton Ham MD Entered By: Linton Ham on 03/12/2021 15:52:00 Brearley, Richard Zavala ([id number]) -------------------------------------------------------------------------------- Debridement Details Patient Name: Richard Zavala Date of Service: 03/12/2021 2:00 PM Medical Record Number: [id number] Patient Account Number: [id number] Date of Birth/Sex: May 02, 1961 (60 y.o. M) Treating RN: Cornell Barman Primary Care Provider: Lethea Killings Other Clinician: Jeanine Luz Referring Provider: Grayland Ormond Treating Provider/Extender: Tito Dine in Treatment: 0 Debridement Performed for Wound #1 Right,Dorsal Foot Assessment: Performed By: Physician Ricard Dillon, MD Debridement Type: Debridement Severity of Tissue Pre Debridement: Fat layer exposed Level of Consciousness (Pre- Awake and Alert procedure): Pre-procedure Verification/Time Out Yes - 15:20 Taken: Total Area Debrided (L x W): 6 (cm) x 1 (cm) = 6 (cm) Tissue and other material Viable, Non-Viable, Eschar, Slough, Subcutaneous, Slough debrided: Level: Skin/Subcutaneous Tissue Debridement Description: Excisional Instrument: Curette Bleeding: Moderate Hemostasis Achieved: Pressure Response to Treatment: Procedure was tolerated well Level of  Consciousness (Post- Awake and Alert procedure): Post Debridement Measurements of Total Wound Length: (cm) 6 Width: (cm) 1 Depth: (cm) 0.8 Volume: (cm) 3.77 Character of Wound/Ulcer Post Debridement: Stable Severity of Tissue Post Debridement: Fat layer exposed Post Procedure Diagnosis Same as Pre-procedure Electronic Signature(s) Signed: 03/12/2021 5:15:48 PM By: Linton Ham MD Signed: 03/12/2021 6:23:16 PM By: Gretta Cool, BSN, RN, CWS, Kim RN, BSN Entered By: Linton Ham on 03/12/2021 15:51:29 Amaker, Richard Zavala ([id number]) -------------------------------------------------------------------------------- Debridement Details Patient Name: Richard Zavala Date of Service: 03/12/2021 2:00 PM Medical Record Number: [id number] Patient Account Number: [id number] Date of Birth/Sex: 08/23/1961 (60 y.o. M) Treating RN: Cornell Barman Primary Care Provider: Lethea Killings Other Clinician: Jeanine Luz Referring Provider: Grayland Ormond Treating Provider/Extender: Tito Dine in Treatment: 0 Debridement Performed for Wound #2 Left Lower Leg Assessment: Performed By: Physician Ricard Dillon, MD Debridement Type: Debridement Severity of Tissue Pre Debridement: Fat layer exposed Level of Consciousness (Pre- Awake and Alert procedure): Pre-procedure Verification/Time Out Yes - 15:20 Taken: Total Area Debrided (L x W): 4 (cm) x 2.5 (cm) = 10 (cm) Tissue and other material Viable, Non-Viable, Eschar, Slough, Subcutaneous, Slough debrided: Level: Skin/Subcutaneous Tissue Debridement Description: Excisional Instrument: Curette Bleeding: Moderate Hemostasis Achieved: Pressure Response to Treatment: Procedure was tolerated well Level of Consciousness (Post- Awake and Alert procedure): Post Debridement Measurements of Total Wound Length: (cm) 4 Width: (cm) 2.5 Depth: (cm) 0.2 Volume: (cm) 1.571 Character of Wound/Ulcer Post Debridement: Stable Severity of  Tissue Post Debridement: Fat layer exposed Post Procedure Diagnosis Same as Pre-procedure Electronic Signature(s) Signed: 03/12/2021 5:15:48 PM By: Linton Ham MD Signed: 03/12/2021 6:23:16 PM By: Gretta Cool, BSN, RN, CWS, Kim RN, BSN Entered By: Linton Ham on 03/12/2021 15:51:39 Richard Zavala, Richard Zavala ([id number]) -------------------------------------------------------------------------------- HPI Details Patient Name: Richard Zavala Date of Service: 03/12/2021 2:00 PM Medical Record Number: [id number] Patient Account Number: [id number] Date of Birth/Sex: 03/22/1961 (60 y.o. M) Treating RN: Cornell Barman Primary Care Provider: Lethea Killings Other Clinician: Jeanine Luz Referring Provider: Grayland Ormond Treating Provider/Extender: Linton Ham  G Weeks in Treatment: 0 History of Present Illness HPI Description: ADMISSION 03/12/2021 This is a 60 year old man who is a type II diabetic on insulin also has diabetic peripheral neuropathy. Also has stage V chronic renal failure on dialysis. He comes in with the areas on his right dorsal foot distally on the lateral part close to his fourth and fifth toes also area on the left anterior lower leg. Very little information. The patient states he is uncertain how this happened digging and having the one in the left anterior leg there for about 3 or 4 weeks in the foot perhaps some longer although no obvious trauma although his son says that he often falls. He does not describe pain in his legs. Not clear that they are addressing this with anything although our intake nurse thought he had silver alginate glued onto the wounds with dried blood. He says he had an x-ray of the right foot [facility] 3 weeks ago or so because of bruising presumably secondary to falls. Past medical history includes type 2 diabetes on insulin, end-stage renal disease on dialysis, hypertension, hyperlipidemia, iron deficiency anemia, history of urinary retention this  month, coronary artery disease, hypertension, history of CVA/TIA disease. He has shunts in the left lower arm and they are using a shunt on the right arm for dialysis. His ABIs were noncompressible bilaterally in our Electronic Signature(s) Signed: 03/12/2021 5:15:48 PM By: Linton Ham MD Entered By: Linton Ham on 03/12/2021 15:54:45 Paullin, Richard Zavala ([id number]) -------------------------------------------------------------------------------- Physical Exam Details Patient Name: Richard Zavala, Richard Zavala Date of Service: 03/12/2021 2:00 PM Medical Record Number: [id number] Patient Account Number: [id number] Date of Birth/Sex: 09-25-1961 (60 y.o. M) Treating RN: Cornell Barman Primary Care Provider: Lethea Killings Other Clinician: Jeanine Luz Referring Provider: Grayland Ormond Treating Provider/Extender: Tito Dine in Treatment: 0 Constitutional Could not get a blood pressure as he has shunts in both arms. Pulse regular and within target range for patient.Marland Kitchen Respirations regular, non-labored and within target range.. Temperature is normal and within the target range for the patient.Marland Kitchen appears in no distress. Respiratory Respiratory effort is easy and symmetric bilaterally. Rate is normal at rest and on room air.. Cardiovascular I had trouble feeling femoral or popliteal pulses bilateral. Pedal pulses absent bilaterally.. No major edema. Musculoskeletal Is bruising quite extensively in his right forefoot across his toes into wounds ankle area anteriorly. Psychiatric Flat affect.. Notes Wound exam; marked difficulty feeling peripheral pulses anywhere in his lower extremities. He has a superficial wound on the left anterior mid tibia area. Very necrotic surface separating I used a #5 curette to remove this. He bleeds quite freely requiring hemostasis with a pressure dressing oAlso a difficult area on the right dorsal foot just proximal to the fourth and fifth toes the same  necrotic separating debris. When I remove this there was superficial areas but also a deep probing area proximally with significant depth and undermining. No evidence of infection Electronic Signature(s) Signed: 03/12/2021 5:15:48 PM By: Linton Ham MD Entered By: Linton Ham on 03/12/2021 16:11:04 Richard Zavala ([id number]) -------------------------------------------------------------------------------- Physician Orders Details Patient Name: Richard Zavala Date of Service: 03/12/2021 2:00 PM Medical Record Number: [id number] Patient Account Number: [id number] Date of Birth/Sex: 1961/05/24 (60 y.o. M) Treating RN: Cornell Barman Primary Care Provider: Lethea Killings Other Clinician: Jeanine Luz Referring Provider: Grayland Ormond Treating Provider/Extender: Tito Dine in Treatment: 0 Verbal / Phone Orders: No Diagnosis Coding Follow-up Appointments o Return Appointment in 1 week. Bathing/ Engineer, mining  o May shower; gently cleanse wound with antibacterial soap, rinse and pat dry prior to dressing wounds Edema Control - Lymphedema / Segmental Compressive Device / Other o Elevate, Exercise Daily and Avoid Standing for Long Periods of Time. o Elevate legs to the level of the heart and pump ankles as often as possible o Elevate leg(s) parallel to the floor when sitting. Additional Orders / Instructions o Follow Nutritious Diet and Increase Protein Intake Wound Treatment Wound #1 - Foot Wound Laterality: Dorsal, Right Cleanser: Soap and Water 3 x Per Week/30 Days Discharge Instructions: Gently cleanse wound with antibacterial soap, rinse and pat dry prior to dressing wounds Primary Dressing: Silvercel 4 1/4x 4 1/4 (in/in) (DME) (Generic) 3 x Per Week/30 Days Discharge Instructions: Apply Silvercel 4 1/4x 4 1/4 (in/in) as instructed Secondary Dressing: ABD Pad 5x9 (in/in) (DME) (Generic) 3 x Per Week/30 Days Discharge Instructions: Cover with ABD  pad Secured With: 58M Medipore H Soft Cloth Surgical Tape, 2x2 (in/yd) (DME) (Generic) 3 x Per Week/30 Days Secured With: Conforming Stretch Gauze Bandage 4x75 (in/in) (DME) (Generic) 3 x Per Week/30 Days Discharge Instructions: Apply as directed Wound #2 - Lower Leg Wound Laterality: Left Cleanser: Soap and Water 3 x Per Week/30 Days Discharge Instructions: Gently cleanse wound with antibacterial soap, rinse and pat dry prior to dressing wounds Primary Dressing: Silvercel 4 1/4x 4 1/4 (in/in) (DME) (Generic) 3 x Per Week/30 Days Discharge Instructions: Apply Silvercel 4 1/4x 4 1/4 (in/in) as instructed Secondary Dressing: ABD Pad 5x9 (in/in) (DME) (Generic) 3 x Per Week/30 Days Discharge Instructions: Cover with ABD pad Secured With: 58M Medipore H Soft Cloth Surgical Tape, 2x2 (in/yd) (DME) (Generic) 3 x Per Week/30 Days Secured With: Conforming Stretch Gauze Bandage 4x75 (in/in) (DME) (Generic) 3 x Per Week/30 Days Discharge Instructions: Apply as directed Services and Therapies o Ankle Brachial Index (ABI) - bilateral Electronic Signature(s) Signed: 03/12/2021 5:15:48 PM By: Linton Ham MD Signed: 03/12/2021 6:23:16 PM By: Gretta Cool, BSN, RN, CWS, Kim RN, BSN [id number], Richard Zavala ([id number]) Entered By: Gretta Cool, BSN, RN, CWS, Kim on 03/12/2021 17:00:44 Richard Zavala ([id number]) -------------------------------------------------------------------------------- Problem List Details Patient Name: Richard Zavala, Richard Zavala. Date of Service: 03/12/2021 2:00 PM Medical Record Number: [id number] Patient Account Number: [id number] Date of Birth/Sex: 1961/10/12 (60 y.o. M) Treating RN: Cornell Barman Primary Care Provider: Lethea Killings Other Clinician: Jeanine Luz Referring Provider: Grayland Ormond Treating Provider/Extender: Tito Dine in Treatment: 0 Active Problems ICD-10 Encounter Code Description Active Date MDM Diagnosis E11.621 Type 2 diabetes mellitus with foot ulcer 03/12/2021  No Yes L97.518 Non-pressure chronic ulcer of other part of right foot with other specified 03/12/2021 No Yes severity L97.828 Non-pressure chronic ulcer of other part of left lower leg with other 03/12/2021 No Yes specified severity E11.42 Type 2 diabetes mellitus with diabetic polyneuropathy 03/12/2021 No Yes E11.51 Type 2 diabetes mellitus with diabetic peripheral angiopathy without 03/12/2021 No Yes gangrene Inactive Problems Resolved Problems Electronic Signature(s) Signed: 03/12/2021 5:13:37 PM By: Gretta Cool, BSN, RN, CWS, Kim RN, BSN Signed: 03/12/2021 5:15:48 PM By: Linton Ham MD Entered By: Gretta Cool, BSN, RN, CWS, Kim on 03/12/2021 17:13:37 Kok, Richard Zavala ([id number]) -------------------------------------------------------------------------------- Progress Note Details Patient Name: Richard Zavala, Richard Zavala. Date of Service: 03/12/2021 2:00 PM Medical Record Number: [id number] Patient Account Number: [id number] Date of Birth/Sex: [birth year] (60 y.o. M) Treating RN: Cornell Barman Primary Care Provider: Lethea Killings Other Clinician: Jeanine Luz Referring Provider: Grayland Ormond Treating Provider/Extender: Tito Dine in Treatment: 0 Subjective Chief Complaint Information obtained  from Patient 03/12/2021; patient is here for wounds on his left anterior lower leg and right dorsal foot History of Present Illness (HPI) ADMISSION 03/12/2021 This is a 60 year old man who is a type II diabetic on insulin also has diabetic peripheral neuropathy. Also has stage V chronic renal failure on dialysis. He comes in with the areas on his right dorsal foot distally on the lateral part close to his fourth and fifth toes also area on the left anterior lower leg. Very little information. The patient states he is uncertain how this happened digging and having the one in the left anterior leg there for about 3 or 4 weeks in the foot perhaps some longer although no obvious trauma although his son  says that he often falls. He does not describe pain in his legs. Not clear that they are addressing this with anything although our intake nurse thought he had silver alginate glued onto the wounds with dried blood. He says he had an x-ray of the right foot [facility] 3 weeks ago or so because of bruising presumably secondary to falls. Past medical history includes type 2 diabetes on insulin, end-stage renal disease on dialysis, hypertension, hyperlipidemia, iron deficiency anemia, history of urinary retention this month, coronary artery disease, hypertension, history of CVA/TIA disease. He has shunts in the left lower arm and they are using a shunt on the right arm for dialysis. His ABIs were noncompressible bilaterally in our Patient History Information obtained from Patient. Allergies metformin, penicillin Social History Former smoker, Marital Status - Single, Alcohol Use - Never, Drug Use - No History, Caffeine Use - Daily. Medical History Eyes Denies history of Cataracts, Glaucoma, Optic Neuritis Ear/Nose/Mouth/Throat Denies history of Chronic sinus problems/congestion, Middle ear problems Hematologic/Lymphatic Denies history of Anemia, Hemophilia, Human Immunodeficiency Virus, Lymphedema, Sickle Cell Disease Respiratory Denies history of Aspiration, Asthma, Chronic Obstructive Pulmonary Disease (COPD), Pneumothorax, Sleep Apnea, Tuberculosis Cardiovascular Patient has history of Congestive Heart Failure, Coronary Artery Disease, Hypertension Denies history of Angina, Arrhythmia, Deep Vein Thrombosis, Hypotension, Myocardial Infarction, Peripheral Arterial Disease, Peripheral Venous Disease, Phlebitis, Vasculitis Gastrointestinal Denies history of Cirrhosis , Colitis, Crohn s, Hepatitis A, Hepatitis B, Hepatitis C Endocrine Patient has history of Type II Diabetes Denies history of Type I Diabetes Genitourinary Denies history of End Stage Renal Disease Immunological Denies  history of Lupus Erythematosus, Raynaud s, Scleroderma Integumentary (Skin) Denies history of History of Burn, History of pressure wounds Neurologic Denies history of Dementia, Neuropathy, Quadriplegia, Paraplegia, Seizure Disorder Oncologic Denies history of Received Chemotherapy, Received Radiation Psychiatric Denies history of Anorexia/bulimia, Confinement Anxiety Patient is treated with Insulin. Blood sugar is not tested. Richard Zavala, Richard Zavala ([id number]) Review of Systems (ROS) Constitutional Symptoms (General Health) Denies complaints or symptoms of Fatigue, Fever, Chills, Marked Weight Change. Eyes Denies complaints or symptoms of Dry Eyes, Vision Changes, Glasses / Contacts. Ear/Nose/Mouth/Throat Denies complaints or symptoms of Difficult clearing ears, Sinusitis. Hematologic/Lymphatic Denies complaints or symptoms of Bleeding / Clotting Disorders, Human Immunodeficiency Virus. Respiratory Denies complaints or symptoms of Chronic or frequent coughs, Shortness of Breath. Cardiovascular Denies complaints or symptoms of Chest pain, LE edema. Gastrointestinal Denies complaints or symptoms of Frequent diarrhea, Nausea, Vomiting. Endocrine Denies complaints or symptoms of Hepatitis, Thyroid disease, Polydypsia (Excessive Thirst). Genitourinary Complains or has symptoms of Kidney failure/ Dialysis. Immunological Denies complaints or symptoms of Hives, Itching. Integumentary (Skin) Complains or has symptoms of Wounds, Swelling. Denies complaints or symptoms of Bleeding or bruising tendency, Breakdown. Neurologic Denies complaints or symptoms of Numbness/parasthesias, Focal/Weakness. Psychiatric  Denies complaints or symptoms of Anxiety, Claustrophobia. Objective Constitutional Could not get a blood pressure as he has shunts in both arms. Pulse regular and within target range for patient.Marland Kitchen Respirations regular, non-labored and within target range.. Temperature is normal and within  the target range for the patient.Marland Kitchen appears in no distress. Vitals Time Taken: 2:24 PM, Height: 68 in, Source: Stated, Weight: 158 lbs, Source: Stated, BMI: 24, Temperature: 97.8 F, Pulse: 71 bpm, Respiratory Rate: 18 breaths/min. Respiratory Respiratory effort is easy and symmetric bilaterally. Rate is normal at rest and on room air.. Cardiovascular I had trouble feeling femoral or popliteal pulses bilateral. Pedal pulses absent bilaterally.. No major edema. Musculoskeletal Is bruising quite extensively in his right forefoot across his toes into wounds ankle area anteriorly. Psychiatric Flat affect.. General Notes: Wound exam; marked difficulty feeling peripheral pulses anywhere in his lower extremities. He has a superficial wound on the left anterior mid tibia area. Very necrotic surface separating I used a #5 curette to remove this. He bleeds quite freely requiring hemostasis with a pressure dressing Also a difficult area on the right dorsal foot just proximal to the fourth and fifth toes the same necrotic separating debris. When I remove this there was superficial areas but also a deep probing area proximally with significant depth and undermining. No evidence of infection Integumentary (Hair, Skin) Wound #1 status is Open. Original cause of wound was Gradually Appeared. The date acquired was: 02/18/2021. The wound is located on the Right,Dorsal Foot. The wound measures 6cm length x 1cm width x 0.1cm depth; 4.712cm^2 area and 0.471cm^3 volume. There is Fat Layer (Subcutaneous Tissue) exposed. There is no tunneling or undermining noted. There is a medium amount of sanguinous drainage noted. There is medium (34-66%) red granulation within the wound bed. There is a medium (34-66%) amount of necrotic tissue within the wound bed including Eschar and Adherent Slough. Wound #2 status is Open. Original cause of wound was Gradually Appeared. The date acquired was: 02/18/2021. The wound is located on  the Left Lower Leg. The wound measures 4cm length x 2.5cm width x 0.1cm depth; 7.854cm^2 area and 0.785cm^3 volume. There is Fat Layer (Subcutaneous Tissue) exposed. There is no tunneling or undermining noted. There is a medium amount of serosanguineous drainage noted. There is small (1-33%) granulation within the wound bed. There is a large (67-100%) amount of necrotic tissue within the wound bed including TRYSON, LUMLEY ([id number]) Eschar and Adherent Slough. Assessment Active Problems ICD-10 Type 2 diabetes mellitus with foot ulcer Non-pressure chronic ulcer of other part of right foot with other specified severity Non-pressure chronic ulcer of other part of left lower leg with other specified severity Type 2 diabetes mellitus with diabetic polyneuropathy Type 2 diabetes mellitus with diabetic peripheral angiopathy without gangrene Procedures Wound #1 Pre-procedure diagnosis of Wound #1 is a Diabetic Wound/Ulcer of the Lower Extremity located on the Right,Dorsal Foot .Severity of Tissue Pre Debridement is: Fat layer exposed. There was a Excisional Skin/Subcutaneous Tissue Debridement with a total area of 6 sq cm performed by Ricard Dillon, MD. With the following instrument(s): Curette to remove Viable and Non-Viable tissue/material. Material removed includes Eschar, Subcutaneous Tissue, and Slough. No specimens were taken. A time out was conducted at 15:20, prior to the start of the procedure. A Moderate amount of bleeding was controlled with Pressure. The procedure was tolerated well. Post Debridement Measurements: 6cm length x 1cm width x 0.8cm depth; 3.77cm^3 volume. Character of Wound/Ulcer Post Debridement is stable. Severity of Tissue  Post Debridement is: Fat layer exposed. Post procedure Diagnosis Wound #1: Same as Pre-Procedure Wound #2 Pre-procedure diagnosis of Wound #2 is a Diabetic Wound/Ulcer of the Lower Extremity located on the Left Lower Leg .Severity of Tissue  Pre Debridement is: Fat layer exposed. There was a Excisional Skin/Subcutaneous Tissue Debridement with a total area of 10 sq cm performed by Ricard Dillon, MD. With the following instrument(s): Curette to remove Viable and Non-Viable tissue/material. Material removed includes Eschar, Subcutaneous Tissue, and Slough. No specimens were taken. A time out was conducted at 15:20, prior to the start of the procedure. A Moderate amount of bleeding was controlled with Pressure. The procedure was tolerated well. Post Debridement Measurements: 4cm length x 2.5cm width x 0.2cm depth; 1.571cm^3 volume. Character of Wound/Ulcer Post Debridement is stable. Severity of Tissue Post Debridement is: Fat layer exposed. Post procedure Diagnosis Wound #2: Same as Pre-Procedure Plan Follow-up Appointments: Return Appointment in 1 week. Bathing/ Shower/ Hygiene: May shower; gently cleanse wound with antibacterial soap, rinse and pat dry prior to dressing wounds Edema Control - Lymphedema / Segmental Compressive Device / Other: Elevate, Exercise Daily and Avoid Standing for Long Periods of Time. Elevate legs to the level of the heart and pump ankles as often as possible Elevate leg(s) parallel to the floor when sitting. Additional Orders / Instructions: Follow Nutritious Diet and Increase Protein Intake Services and Therapies ordered were: Ankle Brachial Index (ABI) - bilateral WOUND #1: - Foot Wound Laterality: Dorsal, Right Cleanser: Soap and Water Discharge Instructions: Gently cleanse wound with antibacterial soap, rinse and pat dry prior to dressing wounds Primary Dressing: Silvercel 4 1/4x 4 1/4 (in/in) Discharge Instructions: Apply Silvercel 4 1/4x 4 1/4 (in/in) as instructed Secondary Dressing: ABD Pad 5x9 (in/in) Discharge Instructions: Cover with ABD pad Secured With: 51M Medipore H Soft Cloth Surgical Tape, 2x2 (in/yd) Secured With: Conforming Stretch Gauze Bandage 4x75 (in/in) Discharge  Instructions: Apply as directed WOUND #2: - Lower Leg Wound Laterality: Left Cleanser: Soap and Water Richard Zavala, Richard Zavala ([id number]) Discharge Instructions: Gently cleanse wound with antibacterial soap, rinse and pat dry prior to dressing wounds Primary Dressing: Silvercel 4 1/4x 4 1/4 (in/in) Discharge Instructions: Apply Silvercel 4 1/4x 4 1/4 (in/in) as instructed Secondary Dressing: ABD Pad 5x9 (in/in) Discharge Instructions: Cover with ABD pad Secured With: 51M Medipore H Soft Cloth Surgical Tape, 2x2 (in/yd) Secured With: Conforming Stretch Gauze Bandage 4x75 (in/in) Discharge Instructions: Apply as directed 1. Good use silver alginate in both wound areas we had to show his son how to change the dressing 2. Arterial studies. A lot of difficulty feeling peripheral pulses anywhere in his lower extremities but not a lot of pain. He also probably has diabetic peripheral neuropathy 3. Not really clear how he is forming these wounds although one would have to wonder about unrecognized trauma although there is very little history here. 4. He has dialysis shunts in both arms we could not check a blood pressure. His son says that at dialysis they try to do a blood pressure in his legs I cannot believe that that would be that helpful I spent 35 minutes to review this patient's past medical history, face-to-face evaluation and preparation of this record Electronic Signature(s) Signed: 03/12/2021 5:15:48 PM By: Linton Ham MD Entered By: Linton Ham on 03/12/2021 16:13:10 Leap, Richard Zavala ([id number]) -------------------------------------------------------------------------------- ROS/PFSH Details Patient Name: Richard Zavala Date of Service: 03/12/2021 2:00 PM Medical Record Number: [id number] Patient Account Number: [id number] Date of Birth/Sex: 1961-07-01 (60 y.o. M)  Treating RN: Carlene Coria Primary Care Provider: Lethea Killings Other Clinician: Jeanine Luz Referring Provider:  Grayland Ormond Treating Provider/Extender: Tito Dine in Treatment: 0 Information Obtained From Patient Constitutional Symptoms (General Health) Complaints and Symptoms: Negative for: Fatigue; Fever; Chills; Marked Weight Change Eyes Complaints and Symptoms: Negative for: Dry Eyes; Vision Changes; Glasses / Contacts Medical History: Negative for: Cataracts; Glaucoma; Optic Neuritis Ear/Nose/Mouth/Throat Complaints and Symptoms: Negative for: Difficult clearing ears; Sinusitis Medical History: Negative for: Chronic sinus problems/congestion; Middle ear problems Hematologic/Lymphatic Complaints and Symptoms: Negative for: Bleeding / Clotting Disorders; Human Immunodeficiency Virus Medical History: Negative for: Anemia; Hemophilia; Human Immunodeficiency Virus; Lymphedema; Sickle Cell Disease Respiratory Complaints and Symptoms: Negative for: Chronic or frequent coughs; Shortness of Breath Medical History: Negative for: Aspiration; Asthma; Chronic Obstructive Pulmonary Disease (COPD); Pneumothorax; Sleep Apnea; Tuberculosis Cardiovascular Complaints and Symptoms: Negative for: Chest pain; LE edema Medical History: Positive for: Congestive Heart Failure; Coronary Artery Disease; Hypertension Negative for: Angina; Arrhythmia; Deep Vein Thrombosis; Hypotension; Myocardial Infarction; Peripheral Arterial Disease; Peripheral Venous Disease; Phlebitis; Vasculitis Gastrointestinal Complaints and Symptoms: Negative for: Frequent diarrhea; Nausea; Vomiting Medical History: Negative for: Cirrhosis ; Colitis; Crohnos; Hepatitis A; Hepatitis B; Hepatitis C Endocrine Richard Zavala, Richard Zavala. ([id number]) Complaints and Symptoms: Negative for: Hepatitis; Thyroid disease; Polydypsia (Excessive Thirst) Medical History: Positive for: Type II Diabetes Negative for: Type I Diabetes Time with diabetes: 31 Treated with: Insulin Blood sugar tested every day:  No Genitourinary Complaints and Symptoms: Positive for: Kidney failure/ Dialysis Medical History: Negative for: End Stage Renal Disease Immunological Complaints and Symptoms: Negative for: Hives; Itching Medical History: Negative for: Lupus Erythematosus; Raynaudos; Scleroderma Integumentary (Skin) Complaints and Symptoms: Positive for: Wounds; Swelling Negative for: Bleeding or bruising tendency; Breakdown Medical History: Negative for: History of Burn; History of pressure wounds Neurologic Complaints and Symptoms: Negative for: Numbness/parasthesias; Focal/Weakness Medical History: Negative for: Dementia; Neuropathy; Quadriplegia; Paraplegia; Seizure Disorder Psychiatric Complaints and Symptoms: Negative for: Anxiety; Claustrophobia Medical History: Negative for: Anorexia/bulimia; Confinement Anxiety Oncologic Medical History: Negative for: Received Chemotherapy; Received Radiation Immunizations Pneumococcal Vaccine: Received Pneumococcal Vaccination: No Implantable Devices None Family and Social History Former smoker; Marital Status - Single; Alcohol Use: Never; Drug Use: No History; Caffeine Use: Daily; Financial Concerns: No; Food, Clothing or Shelter Needs: No; Support System Lacking: No; Transportation Concerns: No Richard Zavala, Richard Zavala ([id number]) Electronic Signature(s) Signed: 03/12/2021 5:15:48 PM By: Linton Ham MD Signed: 03/17/2021 5:08:01 PM By: Carlene Coria RN Entered By: Carlene Coria on 03/12/2021 14:30:46 Dipiero, Richard Zavala ([id number]) -------------------------------------------------------------------------------- SuperBill Details Patient Name: Richard Zavala Date of Service: 03/12/2021 Medical Record Number: [id number] Patient Account Number: [id number] Date of Birth/Sex: February 03, 1961 (60 y.o. M) Treating RN: Cornell Barman Primary Care Provider: Lethea Killings Other Clinician: Jeanine Luz Referring Provider: Grayland Ormond Treating  Provider/Extender: Tito Dine in Treatment: 0 Diagnosis Coding ICD-10 Codes Code Description [id number] Type 2 diabetes mellitus with foot ulcer L97.518 Non-pressure chronic ulcer of other part of right foot with other specified severity L97.828 Non-pressure chronic ulcer of other part of left lower leg with other specified severity E11.42 Type 2 diabetes mellitus with diabetic polyneuropathy E11.51 Type 2 diabetes mellitus with diabetic peripheral angiopathy without gangrene Facility Procedures CPT4 Code: 78588502 Description: 77412 - DEB SUBQ TISSUE 20 SQ CM/< Modifier: Quantity: 1 CPT4 Code: Description: ICD-10 Diagnosis Description L97.518 Non-pressure chronic ulcer of other part of right foot with other specifie L97.828 Non-pressure chronic ulcer of other part of left lower leg with other spec Modifier: d severity ified severity Quantity: Physician  Procedures CPT4 Code: 8309407 Description: WC PHYS LEVEL 3 o NEW PT Modifier: 25 Quantity: 1 CPT4 Code: Description: ICD-10 Diagnosis Description E11.621 Type 2 diabetes mellitus with foot ulcer L97.518 Non-pressure chronic ulcer of other part of right foot with other specifie L97.828 Non-pressure chronic ulcer of other part of left lower leg with other  spec E11.51 Type 2 diabetes mellitus with diabetic peripheral angiopathy without gangr Modifier: d severity ified severity ene Quantity: CPT4 Code: 6808811 Description: 03159 - WC PHYS SUBQ TISS 20 SQ CM Modifier: Quantity: 1 CPT4 Code: Description: ICD-10 Diagnosis Description L97.518 Non-pressure chronic ulcer of other part of right foot with other specifie L97.828 Non-pressure chronic ulcer of other part of left lower leg with other spec Modifier: d severity ified severity Quantity: Electronic Signature(s) Signed: 03/12/2021 5:13:03 PM By: Gretta Cool, BSN, RN, CWS, Kim RN, BSN Signed: 03/12/2021 5:15:48 PM By: Linton Ham MD Entered By: Gretta Cool, BSN, RN, CWS, Kim on  03/12/2021 17:13:02

## 2021-03-17 NOTE — Discharge Instructions (Signed)
You have what is known is a spiral fracture of the big bone in your lower left leg (the tibia).  Please do not bear weight with your left leg and use the crutches when you need to get up and around.  Use cold compresses over the splint and keep your foot elevated when possible.  Please continue to go to dialysis and your other appointments but it is important that you call first thing in the morning on Monday to the orthopedics clinic to schedule a follow-up appointment for later in the week with Dr. Mack Guise or one of his orthopedic colleagues.  Please use pain medication as needed.  If you develop new or worsening symptoms including worsening pain, swelling or other symptoms that concern you, please return to the emergency department.

## 2021-03-17 NOTE — Progress Notes (Signed)
Richard Zavala ([id number]) Visit Report for 03/12/2021 Abuse/Suicide Risk Screen Details Patient Name: Richard Zavala, Richard Zavala. Date of Service: 03/12/2021 2:00 PM Medical Record Number: [id number] Patient Account Number: [id number] Date of Birth/Sex: 1961/01/19 (60 y.o. M) Treating RN: Richard Zavala Primary Care Richard Zavala: Richard Zavala Other Clinician: Jeanine Zavala Referring Richard Zavala: Richard Zavala Treating Richard Zavala: Richard Zavala in Treatment: 0 Abuse/Suicide Risk Screen Items Answer ABUSE RISK SCREEN: Has anyone close to you tried to hurt or harm you recentlyo No Do you feel uncomfortable with anyone in your familyo No Has anyone forced you do things that you didnot want to doo No Electronic Signature(s) Signed: 03/17/2021 5:08:01 PM By: Richard Coria RN Entered By: Richard Zavala on 03/12/2021 14:30:59 Draeger, Richard Zavala ([id number]) -------------------------------------------------------------------------------- Activities of Daily Living Details Patient Name: Richard Zavala Date of Service: 03/12/2021 2:00 PM Medical Record Number: [id number] Patient Account Number: [id number] Date of Birth/Sex: 03/06/1961 (60 y.o. M) Treating RN: Richard Zavala Primary Care Richard Zavala: Richard Zavala Other Clinician: Jeanine Zavala Referring Richard Zavala: Richard Zavala Treating Richard Zavala: Richard Zavala in Treatment: 0 Activities of Daily Living Items Answer Activities of Daily Living (Please select one for each item) Drive Automobile Not Able Take Medications Completely Able Use Telephone Completely Able Care for Appearance Need Assistance Use Toilet Completely Able Bath / Shower Completely Able Dress Self Completely Able Feed Self Completely Able Walk Need Assistance Get In / Out Bed Completely Able Housework Not Able Prepare Meals Not Able Handle Money Not Able Shop for Self Not Able Electronic Signature(s) Signed: 03/17/2021 5:08:01 PM By: Richard Coria RN Entered By: Richard Zavala on 03/12/2021 14:31:44 Richard Zavala ([id number]) -------------------------------------------------------------------------------- Education Screening Details Patient Name: Richard Zavala Date of Service: 03/12/2021 2:00 PM Medical Record Number: [id number] Patient Account Number: [id number] Date of Birth/Sex: 1961/03/05 (60 y.o. M) Treating RN: Richard Zavala Primary Care Richard Zavala: Richard Zavala Other Clinician: Jeanine Zavala Referring Richard Zavala: Richard Zavala Treating Richard Zavala: Richard Zavala in Treatment: 0 Primary Learner Assessed: Patient Learning Preferences/Education Level/Primary Language Learning Preference: Explanation Highest Education Level: High School Preferred Language: English Cognitive Barrier Language Barrier: No Translator Needed: No Memory Deficit: No Emotional Barrier: No Cultural/Religious Beliefs Affecting Medical Care: No Physical Barrier Impaired Vision: No Impaired Hearing: No Decreased Hand dexterity: No Knowledge/Comprehension Knowledge Level: Medium Comprehension Level: Medium Ability to understand written instructions: Medium Ability to understand verbal instructions: Medium Motivation Anxiety Level: Anxious Cooperation: Cooperative Education Importance: Acknowledges Need Interest in Health Problems: Asks Questions Perception: Coherent Willingness to Engage in Self-Management High Activities: Readiness to Engage in Self-Management High Activities: Electronic Signature(s) Signed: 03/17/2021 5:08:01 PM By: Richard Coria RN Entered By: Richard Zavala on 03/12/2021 14:32:51 Richard Zavala, Richard Zavala ([id number]) -------------------------------------------------------------------------------- Fall Risk Assessment Details Patient Name: Richard Zavala Date of Service: 03/12/2021 2:00 PM Medical Record Number: [id number] Patient Account Number: [id number] Date of Birth/Sex: Jan 28, 1961 (60 y.o.  M) Treating RN: Richard Zavala Primary Care Richard Zavala: Richard Zavala Other Clinician: Jeanine Zavala Referring Richard Zavala: Richard Zavala Treating Richard Zavala: Richard Zavala in Treatment: 0 Fall Risk Assessment Items Have you had 2 or more falls in the last 12 monthso 0 No Have you had any fall that resulted in injury in the last 12 monthso 0 No FALLS RISK SCREEN History of falling - immediate or within 3 months 0 No Secondary diagnosis (Do you have 2 or more medical diagnoseso) 0 No Ambulatory aid None/bed rest/wheelchair/nurse 0 No Crutches/cane/walker 0 No Furniture 0 No Intravenous therapy Access/Saline/Heparin Lock 0 No  Gait/Transferring Normal/ bed rest/ wheelchair 0 No Weak (short steps with or without shuffle, stooped but able to lift head while walking, may 0 No seek support from furniture) Impaired (short steps with shuffle, may have difficulty arising from chair, head down, impaired 0 No balance) Mental Status Oriented to own ability 0 No Electronic Signature(s) Signed: 03/17/2021 5:08:01 PM By: Richard Coria RN Entered By: Richard Zavala on 03/12/2021 14:33:16 Richard Zavala, Richard Zavala ([id number]) -------------------------------------------------------------------------------- Foot Assessment Details Patient Name: Richard Zavala Date of Service: 03/12/2021 2:00 PM Medical Record Number: [id number] Patient Account Number: [id number] Date of Birth/Sex: April 08, 1961 (60 y.o. M) Treating RN: Richard Zavala Primary Care Richard Zavala: Richard Zavala Other Clinician: Jeanine Zavala Referring Richard Zavala: Richard Zavala Treating Richard Zavala: Richard Zavala in Treatment: 0 Foot Assessment Items Site Locations + = Sensation present, - = Sensation absent, C = Callus, U = Ulcer R = Redness, W = Warmth, M = Maceration, PU = Pre-ulcerative lesion F = Fissure, S = Swelling, D = Dryness Assessment Right: Left: Other Deformity: No No Prior Foot Ulcer: No No Prior  Amputation: No No Charcot Joint: No No Ambulatory Status: Ambulatory With Help Assistance Device: Wheelchair Gait: Buyer, retail Signature(s) Signed: 03/17/2021 5:08:01 PM By: Richard Coria RN Entered By: Richard Zavala on 03/12/2021 14:41:43 Kreis, Richard Zavala ([id number]) -------------------------------------------------------------------------------- Nutrition Risk Screening Details Patient Name: Richard Zavala Date of Service: 03/12/2021 2:00 PM Medical Record Number: [id number] Patient Account Number: [id number] Date of Birth/Sex: 06-15-1961 (60 y.o. M) Treating RN: Richard Zavala Primary Care Nicle Connole: Richard Zavala Other Clinician: Jeanine Zavala Referring Kayti Poss: Richard Zavala Treating Zayquan Bogard/Extender: Richard Zavala in Treatment: 0 Height (in): 68 Weight (lbs): 158 Body Mass Index (BMI): 24 Nutrition Risk Screening Items Score Screening NUTRITION RISK SCREEN: I have an illness or condition that made me change the kind and/or amount of food I eat 0 No I eat fewer than two meals per Richard 0 No I eat few fruits and vegetables, or milk products 0 No I have three or more drinks of beer, liquor or wine almost every Richard 0 No I have tooth or mouth problems that make it hard for me to eat 0 No I don't always have enough money to buy the food I need 0 No I eat alone most of the time 0 No I take three or more different prescribed or over-the-counter drugs a Richard 1 Yes Without wanting to, I have lost or gained 10 pounds in the last six months 0 No I am not always physically able to shop, cook and/or feed myself 2 Yes Nutrition Protocols Good Risk Protocol Moderate Risk Protocol 0 Provide education on nutrition High Risk Proctocol Risk Level: Moderate Risk Score: 3 Electronic Signature(s) Signed: 03/17/2021 5:08:01 PM By: Richard Coria RN Entered By: Richard Zavala on 03/12/2021 14:36:47

## 2021-03-18 ENCOUNTER — Ambulatory Visit: Payer: Medicare Other | Admitting: Physical Therapy

## 2021-03-19 ENCOUNTER — Encounter: Payer: Medicare Other | Admitting: Internal Medicine

## 2021-03-19 ENCOUNTER — Other Ambulatory Visit: Payer: Self-pay

## 2021-03-20 ENCOUNTER — Inpatient Hospital Stay
Admission: EM | Admit: 2021-03-20 | Discharge: 2021-03-25 | DRG: 492 | Disposition: A | Discharge status: Skilled Nursing Facility | Payer: Medicare Other | Source: Ambulatory Visit | Attending: Orthopedic Surgery | Admitting: Orthopedic Surgery

## 2021-03-20 ENCOUNTER — Other Ambulatory Visit: Payer: Self-pay

## 2021-03-20 ENCOUNTER — Inpatient Hospital Stay: Payer: Medicare Other

## 2021-03-20 ENCOUNTER — Encounter: Payer: Medicare Other | Admitting: Physical Therapy

## 2021-03-20 DIAGNOSIS — Z8673 Personal history of transient ischemic attack (TIA), and cerebral infarction without residual deficits: Secondary | ICD-10-CM

## 2021-03-20 DIAGNOSIS — S82202G Unspecified fracture of shaft of left tibia, subsequent encounter for closed fracture with delayed healing: Secondary | ICD-10-CM

## 2021-03-20 DIAGNOSIS — S82402G Unspecified fracture of shaft of left fibula, subsequent encounter for closed fracture with delayed healing: Secondary | ICD-10-CM

## 2021-03-20 DIAGNOSIS — M810 Age-related osteoporosis without current pathological fracture: Secondary | ICD-10-CM | POA: Diagnosis present

## 2021-03-20 DIAGNOSIS — I5022 Chronic systolic (congestive) heart failure: Secondary | ICD-10-CM | POA: Diagnosis present

## 2021-03-20 DIAGNOSIS — N2581 Secondary hyperparathyroidism of renal origin: Secondary | ICD-10-CM | POA: Diagnosis present

## 2021-03-20 DIAGNOSIS — S82402A Unspecified fracture of shaft of left fibula, initial encounter for closed fracture: Secondary | ICD-10-CM

## 2021-03-20 DIAGNOSIS — N400 Enlarged prostate without lower urinary tract symptoms: Secondary | ICD-10-CM | POA: Diagnosis present

## 2021-03-20 DIAGNOSIS — D631 Anemia in chronic kidney disease: Secondary | ICD-10-CM | POA: Diagnosis present

## 2021-03-20 DIAGNOSIS — Z79899 Other long term (current) drug therapy: Secondary | ICD-10-CM | POA: Diagnosis not present

## 2021-03-20 DIAGNOSIS — Z905 Acquired absence of kidney: Secondary | ICD-10-CM | POA: Diagnosis not present

## 2021-03-20 DIAGNOSIS — Z992 Dependence on renal dialysis: Secondary | ICD-10-CM | POA: Diagnosis not present

## 2021-03-20 DIAGNOSIS — Z8781 Personal history of (healed) traumatic fracture: Secondary | ICD-10-CM

## 2021-03-20 DIAGNOSIS — N186 End stage renal disease: Secondary | ICD-10-CM | POA: Diagnosis present

## 2021-03-20 DIAGNOSIS — I2510 Atherosclerotic heart disease of native coronary artery without angina pectoris: Secondary | ICD-10-CM | POA: Diagnosis present

## 2021-03-20 DIAGNOSIS — S82242A Displaced spiral fracture of shaft of left tibia, initial encounter for closed fracture: Principal | ICD-10-CM | POA: Diagnosis present

## 2021-03-20 DIAGNOSIS — I10 Essential (primary) hypertension: Secondary | ICD-10-CM | POA: Diagnosis not present

## 2021-03-20 DIAGNOSIS — I132 Hypertensive heart and chronic kidney disease with heart failure and with stage 5 chronic kidney disease, or end stage renal disease: Secondary | ICD-10-CM | POA: Diagnosis present

## 2021-03-20 DIAGNOSIS — Z8249 Family history of ischemic heart disease and other diseases of the circulatory system: Secondary | ICD-10-CM

## 2021-03-20 DIAGNOSIS — Z85528 Personal history of other malignant neoplasm of kidney: Secondary | ICD-10-CM

## 2021-03-20 DIAGNOSIS — Y92009 Unspecified place in unspecified non-institutional (private) residence as the place of occurrence of the external cause: Secondary | ICD-10-CM | POA: Diagnosis not present

## 2021-03-20 DIAGNOSIS — Z794 Long term (current) use of insulin: Secondary | ICD-10-CM | POA: Diagnosis not present

## 2021-03-20 DIAGNOSIS — E1122 Type 2 diabetes mellitus with diabetic chronic kidney disease: Secondary | ICD-10-CM | POA: Diagnosis present

## 2021-03-20 DIAGNOSIS — S82832A Other fracture of upper and lower end of left fibula, initial encounter for closed fracture: Secondary | ICD-10-CM | POA: Diagnosis present

## 2021-03-20 DIAGNOSIS — S82209A Unspecified fracture of shaft of unspecified tibia, initial encounter for closed fracture: Secondary | ICD-10-CM

## 2021-03-20 DIAGNOSIS — S82302A Unspecified fracture of lower end of left tibia, initial encounter for closed fracture: Secondary | ICD-10-CM

## 2021-03-20 DIAGNOSIS — Z880 Allergy status to penicillin: Secondary | ICD-10-CM | POA: Diagnosis not present

## 2021-03-20 DIAGNOSIS — S82202A Unspecified fracture of shaft of left tibia, initial encounter for closed fracture: Secondary | ICD-10-CM

## 2021-03-20 DIAGNOSIS — R079 Chest pain, unspecified: Secondary | ICD-10-CM

## 2021-03-20 DIAGNOSIS — E785 Hyperlipidemia, unspecified: Secondary | ICD-10-CM | POA: Diagnosis present

## 2021-03-20 DIAGNOSIS — W19XXXA Unspecified fall, initial encounter: Secondary | ICD-10-CM | POA: Diagnosis present

## 2021-03-20 DIAGNOSIS — E11649 Type 2 diabetes mellitus with hypoglycemia without coma: Secondary | ICD-10-CM | POA: Diagnosis present

## 2021-03-20 DIAGNOSIS — Z825 Family history of asthma and other chronic lower respiratory diseases: Secondary | ICD-10-CM

## 2021-03-20 DIAGNOSIS — E119 Type 2 diabetes mellitus without complications: Secondary | ICD-10-CM | POA: Diagnosis not present

## 2021-03-20 DIAGNOSIS — R778 Other specified abnormalities of plasma proteins: Secondary | ICD-10-CM

## 2021-03-20 DIAGNOSIS — H543 Unqualified visual loss, both eyes: Secondary | ICD-10-CM | POA: Diagnosis present

## 2021-03-20 DIAGNOSIS — Z87891 Personal history of nicotine dependence: Secondary | ICD-10-CM

## 2021-03-20 DIAGNOSIS — I272 Pulmonary hypertension, unspecified: Secondary | ICD-10-CM | POA: Diagnosis present

## 2021-03-20 DIAGNOSIS — Z9889 Other specified postprocedural states: Secondary | ICD-10-CM

## 2021-03-20 DIAGNOSIS — Z20822 Contact with and (suspected) exposure to covid-19: Secondary | ICD-10-CM | POA: Diagnosis present

## 2021-03-20 DIAGNOSIS — Z833 Family history of diabetes mellitus: Secondary | ICD-10-CM

## 2021-03-20 DIAGNOSIS — R109 Unspecified abdominal pain: Secondary | ICD-10-CM

## 2021-03-20 LAB — TYPE AND SCREEN
ABO/RH(D): O POS
Antibody Screen: NEGATIVE

## 2021-03-20 LAB — PROTIME-INR
INR: 1.4 — ABNORMAL HIGH (ref 0.8–1.2)
Prothrombin Time: 16.4 seconds — ABNORMAL HIGH (ref 11.4–15.2)

## 2021-03-20 LAB — CBC WITH DIFFERENTIAL/PLATELET
Abs Immature Granulocytes: 0.02 10*3/uL (ref 0.00–0.07)
Basophils Absolute: 0 10*3/uL (ref 0.0–0.1)
Basophils Relative: 0 %
Eosinophils Absolute: 0.1 10*3/uL (ref 0.0–0.5)
Eosinophils Relative: 2 %
HCT: 26.6 % — ABNORMAL LOW (ref 39.0–52.0)
Hemoglobin: 8.4 g/dL — ABNORMAL LOW (ref 13.0–17.0)
Immature Granulocytes: 0 %
Lymphocytes Relative: 12 %
Lymphs Abs: 0.8 10*3/uL (ref 0.7–4.0)
MCH: 31.3 pg (ref 26.0–34.0)
MCHC: 31.6 g/dL (ref 30.0–36.0)
MCV: 99.3 fL (ref 80.0–100.0)
Monocytes Absolute: 0.9 10*3/uL (ref 0.1–1.0)
Monocytes Relative: 15 %
Neutro Abs: 4.3 10*3/uL (ref 1.7–7.7)
Neutrophils Relative %: 71 %
Platelets: 146 10*3/uL — ABNORMAL LOW (ref 150–400)
RBC: 2.68 MIL/uL — ABNORMAL LOW (ref 4.22–5.81)
RDW: 19 % — ABNORMAL HIGH (ref 11.5–15.5)
WBC: 6.2 10*3/uL (ref 4.0–10.5)
nRBC: 0 % (ref 0.0–0.2)

## 2021-03-20 LAB — COMPREHENSIVE METABOLIC PANEL
ALT: 19 U/L (ref 0–44)
AST: 19 U/L (ref 15–41)
Albumin: 2.6 g/dL — ABNORMAL LOW (ref 3.5–5.0)
Alkaline Phosphatase: 115 U/L (ref 38–126)
Anion gap: 13 (ref 5–15)
BUN: 47 mg/dL — ABNORMAL HIGH (ref 6–20)
CO2: 22 mmol/L (ref 22–32)
Calcium: 8 mg/dL — ABNORMAL LOW (ref 8.9–10.3)
Chloride: 102 mmol/L (ref 98–111)
Creatinine, Ser: 5.71 mg/dL — ABNORMAL HIGH (ref 0.61–1.24)
GFR, Estimated: 11 mL/min — ABNORMAL LOW (ref >60)
Glucose, Bld: 222 mg/dL — ABNORMAL HIGH (ref 70–99)
Potassium: 3.8 mmol/L (ref 3.5–5.1)
Sodium: 137 mmol/L (ref 135–145)
Total Bilirubin: 1.1 mg/dL (ref 0.3–1.2)
Total Protein: 6 g/dL — ABNORMAL LOW (ref 6.5–8.1)

## 2021-03-20 LAB — D-DIMER, QUANTITATIVE: D-Dimer, Quant: 2.23 ug/mL-FEU — ABNORMAL HIGH (ref 0.00–0.50)

## 2021-03-20 LAB — APTT: aPTT: 43 seconds — ABNORMAL HIGH (ref 24–36)

## 2021-03-20 LAB — TROPONIN I (HIGH SENSITIVITY)
Troponin I (High Sensitivity): 222 ng/L (ref <18)
Troponin I (High Sensitivity): 186 ng/L (ref <18)

## 2021-03-20 LAB — RESP PANEL BY RT-PCR (FLU A&B, COVID) ARPGX2
Influenza A by PCR: NEGATIVE
Influenza B by PCR: NEGATIVE
SARS Coronavirus 2 by RT PCR: NEGATIVE

## 2021-03-20 LAB — BRAIN NATRIURETIC PEPTIDE: B Natriuretic Peptide: >4500 pg/mL — ABNORMAL HIGH (ref 0.0–100.0)

## 2021-03-20 MED ORDER — HYDRALAZINE HCL 20 MG/ML IJ SOLN: 10.0000 mg | Freq: Four times a day (QID) | INTRAMUSCULAR | Status: DC | PRN

## 2021-03-20 MED ORDER — SODIUM CHLORIDE 0.9 % IV SOLN: INTRAVENOUS | Status: DC

## 2021-03-20 MED ORDER — DOCUSATE SODIUM 100 MG PO CAPS
100.0000 mg | ORAL_CAPSULE | Freq: Two times a day (BID) | ORAL | Status: DC
  Administered 2021-03-20 – 2021-03-25 (×9): 100 mg via ORAL
  Filled 2021-03-20 (×11): qty 1

## 2021-03-20 MED ORDER — METHOCARBAMOL 500 MG PO TABS
500.0000 mg | ORAL_TABLET | Freq: Four times a day (QID) | ORAL | Status: DC | PRN
  Filled 2021-03-20: qty 1

## 2021-03-20 MED ORDER — METHOCARBAMOL 1000 MG/10ML IJ SOLN
500.0000 mg | Freq: Four times a day (QID) | INTRAVENOUS | Status: DC | PRN
  Filled 2021-03-20: qty 5

## 2021-03-20 MED ORDER — HYDROMORPHONE HCL 1 MG/ML IJ SOLN: 0.5000 mg | INTRAMUSCULAR | Status: DC | PRN

## 2021-03-20 MED ORDER — PANTOPRAZOLE SODIUM 40 MG IV SOLR
40.0000 mg | Freq: Two times a day (BID) | INTRAVENOUS | Status: DC
  Administered 2021-03-20 – 2021-03-25 (×10): 40 mg via INTRAVENOUS
  Filled 2021-03-20 (×10): qty 40

## 2021-03-20 MED ORDER — MAGNESIUM CITRATE PO SOLN
1.0000 | Freq: Once | ORAL | Status: DC | PRN
  Filled 2021-03-20: qty 296

## 2021-03-20 MED ORDER — SENNOSIDES-DOCUSATE SODIUM 8.6-50 MG PO TABS
1.0000 | ORAL_TABLET | Freq: Every evening | ORAL | Status: DC | PRN
Start: 2021-03-20 — End: 2021-03-21

## 2021-03-20 MED ORDER — OXYCODONE HCL 5 MG PO TABS
5.0000 mg | ORAL_TABLET | ORAL | Status: DC | PRN
Start: 2021-03-20 — End: 2021-03-21

## 2021-03-20 MED ORDER — CLINDAMYCIN PHOSPHATE 600 MG/50ML IV SOLN
600.0000 mg | Freq: Once | INTRAVENOUS | Status: AC
  Administered 2021-03-20: 600 mg via INTRAVENOUS
  Filled 2021-03-20: qty 50

## 2021-03-20 MED ORDER — BISACODYL 5 MG PO TBEC
5.0000 mg | DELAYED_RELEASE_TABLET | Freq: Every day | ORAL | Status: DC | PRN
Start: 2021-03-20 — End: 2021-03-25
  Administered 2021-03-24: 5 mg via ORAL
  Filled 2021-03-20: qty 1

## 2021-03-20 NOTE — Progress Notes (Signed)
Richard Zavala, Richard Zavala ([id number]) Visit Report for 03/19/2021 Debridement Details Patient Name: Richard Zavala, Richard Zavala. Date of Service: 03/19/2021 2:00 PM Medical Record Number: [id number] Patient Account Number: [id number] Date of Birth/Sex: 09-16-1961 (60 y.o. M) Treating RN: Cornell Barman Primary Care Provider: Lethea Killings Other Clinician: Referring Provider: Lethea Killings Treating Provider/Extender: Tito Dine in Treatment: 1 Debridement Performed for Wound #1 Right,Dorsal Foot Assessment: Performed By: Physician Ricard Dillon, MD Debridement Type: Debridement Severity of Tissue Pre Debridement: Fat layer exposed Level of Consciousness (Pre- Awake and Alert procedure): Pre-procedure Verification/Time Out Yes - 14:45 Taken: Total Area Debrided (L x W): 3.8 (cm) x 2 (cm) = 7.6 (cm) Tissue and other material Slough, Subcutaneous, Slough debrided: Level: Skin/Subcutaneous Tissue Debridement Description: Excisional Instrument: Curette Bleeding: Minimum Hemostasis Achieved: Pressure Response to Treatment: Procedure was tolerated well Level of Consciousness (Post- Awake and Alert procedure): Post Debridement Measurements of Total Wound Length: (cm) 3.8 Width: (cm) 2 Depth: (cm) 0.8 Volume: (cm) 4.775 Character of Wound/Ulcer Post Debridement: Stable Severity of Tissue Post Debridement: Fat layer exposed Post Procedure Diagnosis Same as Pre-procedure Electronic Signature(s) Signed: 03/20/2021 8:16:57 AM By: Linton Ham MD Signed: 03/20/2021 9:44:32 AM By: Gretta Cool, BSN, RN, CWS, Kim RN, BSN Entered By: Linton Ham on 03/19/2021 14:59:40 Mangas, Richard Zavala ([id number]) -------------------------------------------------------------------------------- HPI Details Patient Name: Richard Zavala Date of Service: 03/19/2021 2:00 PM Medical Record Number: [id number] Patient Account Number: [id number] Date of Birth/Sex: [birth year] (60 y.o. M) Treating RN: Cornell Barman Primary Care Provider: Lethea Killings Other Clinician: Referring Provider: Lethea Killings Treating Provider/Extender: Tito Dine in Treatment: 1 History of Present Illness HPI Description: ADMISSION 03/12/2021 This is a 60 year old man who is a type II diabetic on insulin also has diabetic peripheral neuropathy. Also has stage V chronic renal failure on dialysis. He comes in with the areas on his right dorsal foot distally on the lateral part close to his fourth and fifth toes also area on the left anterior lower leg. Very little information. The patient states he is uncertain how this happened digging and having the one in the left anterior leg there for about 3 or 4 weeks in the foot perhaps some longer although no obvious trauma although his son says that he often falls. He does not describe pain in his legs. Not clear that they are addressing this with anything although our intake nurse thought he had silver alginate glued onto the wounds with dried blood. He says he had an x-ray of the right foot [facility] 3 weeks ago or so because of bruising presumably secondary to falls. Past medical history includes type 2 diabetes on insulin, end-stage renal disease on dialysis, hypertension, hyperlipidemia, iron deficiency anemia, history of urinary retention this month, coronary artery disease, hypertension, history of CVA/TIA disease. He has shunts in the left lower arm and they are using a shunt on the right arm for dialysis. His ABIs were noncompressible bilaterally in our 3/30; patient I admitted to the clinic last week. He has a dangerous wound on the dorsal lateral part of his left foot close to the base of his fourth and fifth toes. The more proximal part of this is undermining. The exact cause of this is unclear however he is a type II diabetic on dialysis. I had difficulty feeling distal pulses we ordered arterial studies but they still have not been arranged. He also  had a small area on the left anterior mid tibia however this week he had a fall  over the weekend and ended up with a spiral fracture of the distal tibia this had a brace placed on it by the ER we did not remove this to look at the wound he sees orthopedics tomorrow. He is obviously nonweightbearing Electronic Signature(s) Signed: 03/20/2021 8:16:57 AM By: Linton Ham MD Entered By: Linton Ham on 03/19/2021 15:00:59 Richard Zavala ([id number]) -------------------------------------------------------------------------------- Physical Exam Details Patient Name: Richard Zavala, Richard Zavala Date of Service: 03/19/2021 2:00 PM Medical Record Number: [id number] Patient Account Number: [id number] Date of Birth/Sex: 12/05/1961 (60 y.o. M) Treating RN: Cornell Barman Primary Care Provider: Lethea Killings Other Clinician: Referring Provider: Lethea Killings Treating Provider/Extender: Tito Dine in Treatment: 1 Constitutional Pulse regular and within target range for patient.Marland Kitchen Respirations regular, non-labored and within target range.. Temperature is normal and within the target range for the patient.Marland Kitchen appears in no distress. Notes Wound exam; he probably has very faint dorsalis pedis and posterior tibial pulses. Popliteal pulses palpable I did not feel his femoral pulse through clothing. His foot is not cold oThe wound is on the dorsal foot linear superficial area against the base of the fourth fifth web space distally there is a deeper area that probes laterally by 1.2 cm. Not obviously infected oI used a #5 curette to remove adherent necrotic debris from the surface of the wound on the right dorsal foot also the tunneling area. Minimal bleeding. No overt Electronic Signature(s) Signed: 03/20/2021 8:16:57 AM By: Linton Ham MD Entered By: Linton Ham on 03/19/2021 15:04:33 Richard Zavala, Richard Zavala  ([id number]) -------------------------------------------------------------------------------- Physician Orders Details Patient Name: Richard Zavala Date of Service: 03/19/2021 2:00 PM Medical Record Number: [id number] Patient Account Number: [id number] Date of Birth/Sex: October 13, 1961 (60 y.o. M) Treating RN: Cornell Barman Primary Care Provider: Lethea Killings Other Clinician: Referring Provider: Lethea Killings Treating Provider/Extender: Tito Dine in Treatment: 1 Verbal / Phone Orders: No Diagnosis Coding Follow-up Appointments o Return Appointment in 1 week. Bathing/ Shower/ Hygiene o May shower; gently cleanse wound with antibacterial soap, rinse and pat dry prior to dressing wounds Edema Control - Lymphedema / Segmental Compressive Device / Other o Elevate, Exercise Daily and Avoid Standing for Long Periods of Time. o Elevate legs to the level of the heart and pump ankles as often as possible o Elevate leg(s) parallel to the floor when sitting. Additional Orders / Instructions o Follow Nutritious Diet and Increase Protein Intake Wound Treatment Wound #1 - Foot Wound Laterality: Dorsal, Right Cleanser: Soap and Water 3 x Per Week/30 Days Discharge Instructions: Gently cleanse wound with antibacterial soap, rinse and pat dry prior to dressing wounds Primary Dressing: Silvercel 4 1/4x 4 1/4 (in/in) (Generic) 3 x Per Week/30 Days Discharge Instructions: Apply Silvercel 4 1/4x 4 1/4 (in/in) as instructed Secondary Dressing: ABD Pad 5x9 (in/in) (Generic) 3 x Per Week/30 Days Discharge Instructions: Cover with ABD pad Secured With: 66M Medipore H Soft Cloth Surgical Tape, 2x2 (in/yd) (Generic) 3 x Per Week/30 Days Secured With: Conforming Stretch Gauze Bandage 4x75 (in/in) (Generic) 3 x Per Week/30 Days Discharge Instructions: Apply as directed Notes Left leg has splinting cast applied, was not able to assess the left leg. Electronic Signature(s) Signed: 03/20/2021  8:16:57 AM By: Linton Ham MD Signed: 03/20/2021 9:44:32 AM By: Gretta Cool, BSN, RN, CWS, Kim RN, BSN Entered By: Gretta Cool, BSN, RN, CWS, Kim on 03/19/2021 14:57:14 Richard Zavala, Richard Zavala ([id number]) -------------------------------------------------------------------------------- Problem List Details Patient Name: Richard Zavala, Richard Zavala Date of Service: 03/19/2021 2:00 PM Medical Record Number: [id number] Patient Account Number:  [id number] Date of Birth/Sex: 30-Jul-1961 (60 y.o. M) Treating RN: Cornell Barman Primary Care Provider: Lethea Killings Other Clinician: Referring Provider: Lethea Killings Treating Provider/Extender: Tito Dine in Treatment: 1 Active Problems ICD-10 Encounter Code Description Active Date MDM Diagnosis E11.621 Type 2 diabetes mellitus with foot ulcer 03/12/2021 No Yes L97.518 Non-pressure chronic ulcer of other part of right foot with other specified 03/12/2021 No Yes severity L97.828 Non-pressure chronic ulcer of other part of left lower leg with other 03/12/2021 No Yes specified severity E11.42 Type 2 diabetes mellitus with diabetic polyneuropathy 03/12/2021 No Yes E11.51 Type 2 diabetes mellitus with diabetic peripheral angiopathy without 03/12/2021 No Yes gangrene Inactive Problems Resolved Problems Electronic Signature(s) Signed: 03/20/2021 8:16:57 AM By: Linton Ham MD Entered By: Linton Ham on 03/19/2021 14:59:14 Richard Zavala, Richard Zavala ([id number]) -------------------------------------------------------------------------------- Progress Note Details Patient Name: Richard Zavala Date of Service: 03/19/2021 2:00 PM Medical Record Number: [id number] Patient Account Number: [id number] Date of Birth/Sex: 13-Apr-1961 (60 y.o. M) Treating RN: Cornell Barman Primary Care Provider: Lethea Killings Other Clinician: Referring Provider: Lethea Killings Treating Provider/Extender: Tito Dine in Treatment: 1 Subjective History of Present Illness  (HPI) ADMISSION 03/12/2021 This is a 60 year old man who is a type II diabetic on insulin also has diabetic peripheral neuropathy. Also has stage V chronic renal failure on dialysis. He comes in with the areas on his right dorsal foot distally on the lateral part close to his fourth and fifth toes also area on the left anterior lower leg. Very little information. The patient states he is uncertain how this happened digging and having the one in the left anterior leg there for about 3 or 4 weeks in the foot perhaps some longer although no obvious trauma although his son says that he often falls. He does not describe pain in his legs. Not clear that they are addressing this with anything although our intake nurse thought he had silver alginate glued onto the wounds with dried blood. He says he had an x-ray of the right foot [facility] 3 weeks ago or so because of bruising presumably secondary to falls. Past medical history includes type 2 diabetes on insulin, end-stage renal disease on dialysis, hypertension, hyperlipidemia, iron deficiency anemia, history of urinary retention this month, coronary artery disease, hypertension, history of CVA/TIA disease. He has shunts in the left lower arm and they are using a shunt on the right arm for dialysis. His ABIs were noncompressible bilaterally in our 3/30; patient I admitted to the clinic last week. He has a dangerous wound on the dorsal lateral part of his left foot close to the base of his fourth and fifth toes. The more proximal part of this is undermining. The exact cause of this is unclear however he is a type II diabetic on dialysis. I had difficulty feeling distal pulses we ordered arterial studies but they still have not been arranged. He also had a small area on the left anterior mid tibia however this week he had a fall over the weekend and ended up with a spiral fracture of the distal tibia this had a brace placed on it by the ER we did not  remove this to look at the wound he sees orthopedics tomorrow. He is obviously nonweightbearing Objective Constitutional Pulse regular and within target range for patient.Marland Kitchen Respirations regular, non-labored and within target range.. Temperature is normal and within the target range for the patient.Marland Kitchen appears in no distress. Vitals Time Taken: 2:15 PM, Height: 68 in, Weight:  158 lbs, BMI: 24, Temperature: 97.9 F, Pulse: 63 bpm, Respiratory Rate: 20 breaths/min. General Notes: Wound exam; he probably has very faint dorsalis pedis and posterior tibial pulses. Popliteal pulses palpable I did not feel his femoral pulse through clothing. His foot is not cold The wound is on the dorsal foot linear superficial area against the base of the fourth fifth web space distally there is a deeper area that probes laterally by 1.2 cm. Not obviously infected Integumentary (Hair, Skin) Wound #1 status is Open. Original cause of wound was Gradually Appeared. The date acquired was: 02/18/2021. The wound has been in treatment 1 weeks. The wound is located on the Right,Dorsal Foot. The wound measures 3.8cm length x 2cm width x 0.8cm depth; 5.969cm^2 area and 4.775cm^3 volume. There is Fat Layer (Subcutaneous Tissue) exposed. There is no tunneling noted, however, there is undermining starting at 12:00 and ending at 12:00 with a maximum distance of 2.3cm. There is a medium amount of sanguinous drainage noted. There is small (1-33%) red granulation within the wound bed. There is a large (67-100%) amount of necrotic tissue within the wound bed including Eschar and Adherent Slough. Wound #2 status is Open. Original cause of wound was Gradually Appeared. The date acquired was: 02/18/2021. The wound has been in treatment 1 weeks. The wound is located on the Left Lower Leg. There is Fat Layer (Subcutaneous Tissue) exposed. There is a medium amount of serosanguineous drainage noted. There is small (1-33%) granulation within the  wound bed. There is a large (67-100%) amount of necrotic tissue within the wound bed including Eschar and Adherent Slough. General Notes: Patient came in today with a cast on his left leg. Wound cannot be assessed at this time. Richard Zavala, Richard Zavala ([id number]) Assessment Active Problems ICD-10 Type 2 diabetes mellitus with foot ulcer Non-pressure chronic ulcer of other part of right foot with other specified severity Non-pressure chronic ulcer of other part of left lower leg with other specified severity Type 2 diabetes mellitus with diabetic polyneuropathy Type 2 diabetes mellitus with diabetic peripheral angiopathy without gangrene Procedures Wound #1 Pre-procedure diagnosis of Wound #1 is a Diabetic Wound/Ulcer of the Lower Extremity located on the Right,Dorsal Foot .Severity of Tissue Pre Debridement is: Fat layer exposed. There was a Excisional Skin/Subcutaneous Tissue Debridement with a total area of 7.6 sq cm performed by Ricard Dillon, MD. With the following instrument(s): Curette Material removed includes Subcutaneous Tissue and Slough and. A time out was conducted at 14:45, prior to the start of the procedure. A Minimum amount of bleeding was controlled with Pressure. The procedure was tolerated well. Post Debridement Measurements: 3.8cm length x 2cm width x 0.8cm depth; 4.775cm^3 volume. Character of Wound/Ulcer Post Debridement is stable. Severity of Tissue Post Debridement is: Fat layer exposed. Post procedure Diagnosis Wound #1: Same as Pre-Procedure Plan Follow-up Appointments: Return Appointment in 1 week. Bathing/ Shower/ Hygiene: May shower; gently cleanse wound with antibacterial soap, rinse and pat dry prior to dressing wounds Edema Control - Lymphedema / Segmental Compressive Device / Other: Elevate, Exercise Daily and Avoid Standing for Long Periods of Time. Elevate legs to the level of the heart and pump ankles as often as possible Elevate leg(s) parallel to the  floor when sitting. Additional Orders / Instructions: Follow Nutritious Diet and Increase Protein Intake General Notes: Left leg has splinting cast applied, was not able to assess the left leg. WOUND #1: - Foot Wound Laterality: Dorsal, Right Cleanser: Soap and Water 3 x Per Week/30 Days  Discharge Instructions: Gently cleanse wound with antibacterial soap, rinse and pat dry prior to dressing wounds Primary Dressing: Silvercel 4 1/4x 4 1/4 (in/in) (Generic) 3 x Per Week/30 Days Discharge Instructions: Apply Silvercel 4 1/4x 4 1/4 (in/in) as instructed Secondary Dressing: ABD Pad 5x9 (in/in) (Generic) 3 x Per Week/30 Days Discharge Instructions: Cover with ABD pad Secured With: 75M Medipore H Soft Cloth Surgical Tape, 2x2 (in/yd) (Generic) 3 x Per Week/30 Days Secured With: Conforming Stretch Gauze Bandage 4x75 (in/in) (Generic) 3 x Per Week/30 Days Discharge Instructions: Apply as directed 1. I continued with silver alginate for now at least until we get the arterial studies 2. Asked him to get the arterial studies done by next week or least an appointment 3. He is a type II diabetic with all the complications. Might consider a advanced treatment option 4. Spiral fracture of the left distal tibia did not allow [place] to look at the wound there today. I think it is likely they are going to cast this Electronic Signature(s) Signed: 03/20/2021 8:16:57 AM By: Linton Ham MD Entered By: Linton Ham on 03/19/2021 15:03:46 Richard Zavala, Richard Zavala ([id number]) Richard Zavala, Richard Zavala ([id number]) -------------------------------------------------------------------------------- SuperBill Details Patient Name: Richard Zavala Date of Service: 03/19/2021 Medical Record Number: [id number] Patient Account Number: [id number] Date of Birth/Sex: 01/22/1961 (60 y.o. M) Treating RN: Cornell Barman Primary Care Provider: Lethea Killings Other Clinician: Referring Provider: Lethea Killings Treating Provider/Extender: Tito Dine in Treatment: 1 Diagnosis Coding ICD-10 Codes Code Description [phone] Type 2 diabetes mellitus with foot ulcer L97.518 Non-pressure chronic ulcer of other part of right foot with other specified severity L97.828 Non-pressure chronic ulcer of other part of left lower leg with other specified severity E11.42 Type 2 diabetes mellitus with diabetic polyneuropathy E11.51 Type 2 diabetes mellitus with diabetic peripheral angiopathy without gangrene Facility Procedures CPT4 Code: [id number] Description: 32023 - DEB SUBQ TISSUE 20 SQ CM/< Modifier: Quantity: 1 CPT4 Code: Description: ICD-10 Diagnosis Description L97.518 Non-pressure chronic ulcer of other part of right foot with other specifi Modifier: ed severity Quantity: Physician Procedures CPT4 Code: [id number] Description: 11042 - WC PHYS SUBQ TISS 20 SQ CM Modifier: Quantity: 1 CPT4 Code: Description: ICD-10 Diagnosis Description L97.518 Non-pressure chronic ulcer of other part of right foot with other specifi Modifier: ed severity Quantity: Electronic Signature(s) Signed: 03/20/2021 8:16:57 AM By: Linton Ham MD Entered By: Linton Ham on 03/19/2021 15:04:02

## 2021-03-20 NOTE — ED Notes (Signed)
After speaking to pt about where he normal gets blood work, pt states "usually left arm or hand" after reviewing with MD, MD states blood and IV to be drawn from L arm.

## 2021-03-20 NOTE — ED Notes (Signed)
Critical result. Troponin 222

## 2021-03-20 NOTE — ED Provider Notes (Addendum)
[facility] Emergency Department Provider Note   ____________________________________________   Event Date/Time   First MD Initiated Contact with Patient 03/20/21 1758     (approximate)  I have reviewed the triage vital signs and the nursing notes.   HISTORY  Chief Complaint Leg Injury    HPI Richard NARDOZZI is a 60 y.o. male with past medical history of hypertension, hyperlipidemia, CAD, diabetes, and ESRD on HD (MWF) who presents to the ED complaining of leg injury.  Patient reports that he initially tripped on his bed 4 days ago, causing his left leg to twist underneath him.  He had acute onset of pain in this leg, was seen in the ED and diagnosed with spiral fracture of his left distal tibia.  He was splinted at that time and after case was discussed with orthopedics, plan was for discharge home with outpatient follow-up.  Since then, he states that he has been "trying his best" to not bear weight on his left leg and instead use crutches.  He admits to occasionally bearing weight on his left leg and followed [facility] at [facility] earlier today.  Repeat x-rays then showed increased displacement from previous and was advised to come to the ED for further evaluation.  He received a full run of dialysis yesterday and has not missed any recent treatments.        Past Medical History:  Diagnosis Date  . Anemia   . Blind   . BPH (benign prostatic hyperplasia)   . CAD (coronary artery disease)   . CHF (congestive heart failure) ([place])   . Chronic kidney disease (CKD), stage IV (severe) (HCC)    DIALYSIS  . Diabetes mellitus without complication ([place])   . ESRD (end stage renal disease) ([place])    Monday-Wednesday-Friday dialysis  . Hyperlipemia   . Hypertension   . Neuropathy   . Osteoporosis   . Pulmonary HTN ([place])   . Stroke [facility])    2013  . TIA (transient ischemic attack)   . TRD (traction retinal detachment)   . Wilms' tumor [facility])     Patient Active  Problem List   Diagnosis Date Noted  . Tibia/fibula fracture, left, closed, with delayed healing, subsequent encounter 03/20/2021  . Sepsis ([place]) 02/12/2021  . Acute metabolic encephalopathy 37/90/2409  . Acute respiratory failure with hypoxia ([place]) 02/12/2021  . Anemia in ESRD (end-stage renal disease) ([place]) 02/12/2021  . HLD (hyperlipidemia) 02/12/2021  . Stroke ([place]) 02/12/2021  . Hypothermia 02/12/2021  . CAP (community acquired pneumonia) 02/12/2021  . ESRD on hemodialysis ([place])   . Acute cystitis 02/06/2021  . Acute upper GI bleed 02/06/2021  . Acute blood loss anemia 02/06/2021  . COVID-19 virus infection 02/06/2021  . Gait instability 10/12/2019  . Pulmonary nodule 10/12/2019  . CAD (coronary artery disease), native coronary artery 03/08/2018  . Hyperlipidemia 03/08/2018  . Essential hypertension 03/08/2018  . Wilms' tumor with favorable history 03/01/2018  . Closed fracture of right distal radius 08/22/2016  . TIA (transient ischemic attack) 08/21/2016  . Encounter for pre-transplant evaluation for kidney transplant 05/17/2016  . Anemia due to other cause   . Hyperkalemia   . Hypoglycemia   . Pain   . End stage renal disease ([place])   . Weakness   . Other chest pain   . Elevated troponin   . Hyperkalemia, diminished renal excretion 08/14/2015  . Hypertensive urgency 03/26/2015  . Anemia in CKD (chronic kidney disease) 12/04/2014  . Chronic kidney  disease, stage IV (severe) ([place]) 12/03/2014  . Acute ischemic stroke ([place]) 08/25/2014  . Acute lacunar stroke ([place]) 06/25/2014  . Orthostatic hypotension 06/25/2014  . Diabetic vitreous hemorrhage associated with type 2 diabetes mellitus ([place]) 09/28/2013  . PDR (proliferative diabetic retinopathy) ([place]) 09/28/2013  . History of Wilms' tumor 08/15/2013  . Iron deficiency anemia 07/21/2013  . Mesenteric artery stenosis ([place]) 03/27/2013  . Heart failure, chronic systolic ([place]) 01/75/1025  . Ischemic cardiomyopathy 03/13/2013   . Pulmonary HTN ([place]) 03/13/2013  . Peripheral edema 02/28/2013  . Diabetic macular edema of both eyes ([place]) 01/16/2013  . Corneal epithelial defect 10/06/2012  . TRD (traction retinal detachment) 09/22/2012  . Vitreous hemorrhage of both eyes ([place]) 07/21/2012  . Closed right hip fracture ([place]) 02/19/2012  . Diabetic sensorimotor polyneuropathy ([place]) 01/27/2012  . Vitamin D deficiency disease 11/30/2011  . Type II diabetes mellitus with renal manifestations ([place]) 07/03/2011  . Type 2 diabetes mellitus with kidney complication, with long-term current use of insulin ([place]) 07/03/2011  . Osteoporosis with fracture 05/28/2011    Past Surgical History:  Procedure Laterality Date  . A/V FISTULAGRAM Left 01/28/2017   Procedure: A/V Fistulagram;  Surgeon: Algernon Huxley, MD;  Location: [place] CV LAB;  Service: Cardiovascular;  Laterality: Left;  . A/V FISTULAGRAM Left 04/28/2018   Procedure: A/V FISTULAGRAM;  Surgeon: Algernon Huxley, MD;  Location: [place] CV LAB;  Service: Cardiovascular;  Laterality: Left;  . A/V FISTULAGRAM N/A 03/21/2020   Procedure: A/V FISTULAGRAM;  Surgeon: Algernon Huxley, MD;  Location: [place] CV LAB;  Service: Cardiovascular;  Laterality: N/A;  . AV FISTULA PLACEMENT    . AV FISTULA PLACEMENT Right 11/23/2019   Procedure: ARTERIOVENOUS (AV) FISTULA CREATION (RADIOCEPHALIC );  Surgeon: Algernon Huxley, MD;  Location: [facility] ORS;  Service: Vascular;  Laterality: Right;  . CARDIAC CATHETERIZATION    . CATARACT EXTRACTION W/PHACO Right 05/14/2016   Procedure: CATARACT EXTRACTION PHACO AND INTRAOCULAR LENS PLACEMENT (IOC);  Surgeon: Eulogio Bear, MD;  Location: [facility] ORS;  Service: Ophthalmology;  Laterality: Right;  [place] 1.46 AP% 11.5 CDE 12.33 FLUID PACK LOT # [id number] H  . EYE SURGERY    . FRACTURE SURGERY     RIGHT LEG WITH ROD  . HIP SURGERY    . NEPHRECTOMY RADICAL    . PERIPHERAL VASCULAR CATHETERIZATION N/A 08/28/2015   Procedure: A/V Shuntogram/Fistulagram;   Surgeon: Algernon Huxley, MD;  Location: [place] CV LAB;  Service: Cardiovascular;  Laterality: N/A;  . PERIPHERAL VASCULAR CATHETERIZATION Left 08/28/2015   Procedure: A/V Shunt Intervention;  Surgeon: Algernon Huxley, MD;  Location: [place] CV LAB;  Service: Cardiovascular;  Laterality: Left;  . UPPER EXTREMITY ANGIOGRAPHY Right 08/01/2020   Procedure: UPPER EXTREMITY ANGIOGRAPHY;  Surgeon: Algernon Huxley, MD;  Location: [place] CV LAB;  Service: Cardiovascular;  Laterality: Right;    Prior to Admission medications   Medication Sig Start Date End Date Taking? Authorizing Provider  amLODipine (NORVASC) 10 MG tablet Hold until followup with outpatient doctor due to intermittent low blood pressure. 02/16/21   Enzo Bi, MD  atorvastatin (LIPITOR) 80 MG tablet Take 1 tablet (80 mg total) by mouth at bedtime. 02/08/21   Enzo Bi, MD  carvedilol (COREG) 12.5 MG tablet Take 1 tablet (12.5 mg total) by mouth 2 (two) times daily. 02/16/21   Enzo Bi, MD  epoetin alfa (EPOGEN) 10000 UNIT/ML injection Inject 0.8 mLs (8,000 Units total) into the vein Every Tuesday,Thursday,and Saturday with dialysis. 02/16/21  Enzo Bi, MD  furosemide (LASIX) 40 MG tablet Hold until followup with outpatient doctor due to intermittent low blood pressure. 02/16/21   Enzo Bi, MD  guaiFENesin-dextromethorphan (ROBITUSSIN DM) 100-10 MG/5ML syrup Take 10 mLs by mouth every 6 (six) hours as needed for cough. 02/16/21   Enzo Bi, MD  hydrALAZINE (APRESOLINE) 100 MG tablet Hold until followup with outpatient doctor due to intermittent low blood pressure. 02/16/21   Enzo Bi, MD  HYDROcodone-acetaminophen (NORCO/VICODIN) 5-325 MG tablet Take 2 tablets by mouth every 6 (six) hours as needed for moderate pain or severe pain. 03/17/21   Hinda Kehr, MD  insulin glargine (LANTUS) 100 UNIT/ML injection Inject 9-10 Units into the skin daily.     [provider]  isosorbide mononitrate (IMDUR) 30 MG 24 hr tablet Hold until  followup with outpatient doctor due to intermittent low blood pressure. 02/16/21   Enzo Bi, MD  lidocaine (LIDODERM) 5 % Place 1 patch onto the skin daily. 11/04/20   [provider]  pantoprazole (PROTONIX) 40 MG tablet Take 40 mg by mouth 2 (two) times daily before a meal. 01/14/21   [provider]    Allergies Metformin and Penicillins  Family History  Problem Relation Age of Onset  . CAD Father   . COPD Father   . CAD Paternal Grandfather   . Diabetes Maternal Aunt   . Diabetes Maternal Uncle     Social History Social History   Tobacco Use  . Smoking status: Former Smoker    Packs/day: 1.00    Types: Cigarettes    Quit date: 06/26/2012    Years since quitting: 8.7  . Smokeless tobacco: Never Used  Vaping Use  . Vaping Use: Never used  Substance Use Topics  . Alcohol use: No  . Drug use: No    Review of Systems  Constitutional: No fever/chills Eyes: No visual changes. ENT: No sore throat. Cardiovascular: Denies chest pain. Respiratory: Denies shortness of breath. Gastrointestinal: No abdominal pain.  No nausea, no vomiting.  No diarrhea.  No constipation. Genitourinary: Negative for dysuria. Musculoskeletal: Negative for back pain. Skin: Negative for rash. Neurological: Negative for headaches, focal weakness or numbness.  ____________________________________________   PHYSICAL EXAM:  VITAL SIGNS: ED Triage Vitals  Enc Vitals Group     BP 03/20/21 1740 (!) 154/64     Pulse Rate 03/20/21 1736 62     Resp 03/20/21 1736 18     Temp 03/20/21 1736 97.8 F (36.6 C)     Temp Source 03/20/21 1736 Oral     SpO2 03/20/21 1736 94 %     Weight --      Height 03/20/21 1738 5\' 8"  (1.727 m)     Head Circumference --      Peak Flow --      Pain Score 03/20/21 1738 2     Pain Loc --      Pain Edu? --      Excl. in [place]? --     Constitutional: Alert and oriented. Eyes: Conjunctivae are normal. Head: Atraumatic. Nose: No  congestion/rhinnorhea. Mouth/Throat: Mucous membranes are moist. Neck: Normal ROM Cardiovascular: Normal rate, regular rhythm. Grossly normal heart sounds.  AV fistulas to bilateral forearms. Respiratory: Normal respiratory effort.  No retractions. Lungs CTAB. Gastrointestinal: Soft and nontender. No distention. Genitourinary: deferred Musculoskeletal: Splint in place to left lower leg, range of motion intact to left toes with cap refill less than 2 seconds. Neurologic:  Normal speech and language. No gross focal  neurologic deficits are appreciated. Skin:  Skin is warm, dry and intact. No rash noted. Psychiatric: Mood and affect are normal. Speech and behavior are normal.  ____________________________________________   LABS (all labs ordered are listed, but only abnormal results are displayed)  Labs Reviewed  URINE CULTURE  RESP PANEL BY RT-PCR (FLU A&B, COVID) ARPGX2  PROTIME-INR  CBC WITH DIFFERENTIAL/PLATELET  COMPREHENSIVE METABOLIC PANEL  APTT  CBC  BASIC METABOLIC PANEL  TYPE AND SCREEN    PROCEDURES  Procedure(s) performed (including Critical Care):  Procedures  ED ECG REPORT I, Blake Divine, the attending physician, personally viewed and interpreted this ECG.   Date: 03/20/2021  EKG Time: 19:33  Rate: 59  Rhythm: normal sinus rhythm  Axis: Normal  Intervals:first-degree A-V block   ST&T Change: None  ____________________________________________   INITIAL IMPRESSION / ASSESSMENT AND PLAN / ED COURSE       60 year old male with past medical history of hypertension, hyperlipidemia, diabetes, CAD, and ESRD on HD (MWF) who presents to the ED complaining of worsening displacement seen on follow-up x-ray after patient suffered distal left tibia fracture 4 days ago.  Case discussed with Dr. Mack Guise of orthopedics prior to patient's arrival, who will plan on admitting the patient for operative intervention tomorrow.  Patient denies significant pain at this time  but does admit to occasionally bearing weight on his left leg.  He remains neurovascularly intact to his left lower extremity.  Patient with few ideal options for IV access as he has bilateral AV fistulas.  They typically use his right forearm fistula for dialysis access and he states IV access is usually placed in his left upper extremity.  Orders for lab work placed by orthopedics, who will admit the patient to their service.  They do request that hospitalist service be consulted for medical clearance.  Case discussed with hospitalist service, patient now complaining of chest pain however EKG reviewed by me and shows no evidence of arrhythmia or ischemia.  Labs thus far are unremarkable, show anemia similar to previous, troponin and D-dimer added on by hospitalist.      ____________________________________________   FINAL CLINICAL IMPRESSION(S) / ED DIAGNOSES  Final diagnoses:  Closed fracture of distal end of left tibia, unspecified fracture morphology, initial encounter  ESRD (end stage renal disease) [facility])     ED Discharge Orders    None       Note:  This document was prepared using Dragon voice recognition software and may include unintentional dictation errors.   Blake Divine, MD 03/20/21 [place]    Blake Divine, MD 03/20/21 [phone]

## 2021-03-20 NOTE — ED Triage Notes (Addendum)
Pt to ER from [facility]. Pt was discharged Sunday with L leg spiral fracture but told to come to the emergency for admission due to nature of break.  [place] aware of patient.

## 2021-03-20 NOTE — ED Notes (Signed)
Pt presents to ED with c/o of having a L leg spiral FX and states he is back today to be admitted for a surgery. Pt presents with 2 fistulas in both arms. Pt denies numbness or tingling in L leg, pt presents in a splint at this time. Pt is A&Ox4 at this time. NAD noted.

## 2021-03-20 NOTE — H&P (Signed)
PREOPERATIVE H&P  Chief Complaint: Left tibia fracture  HPI: Richard Zavala is a 60 y.o. male with diabetes, end-stage renal disease requiring dialysis, coronary artery disease and congestive heart failure.  Patient has a history of a stroke in 2013.  He was sent to the ER from [facility] this afternoon.  Patient was initially seen at the [facility] emergency department on 03/16/2021 after a fall at home.  Patient was diagnosed with a nondisplaced tibial fracture and was splinted, told to be nonweightbearing on the left lower extremity and follow-up at [facility] this week.  Upon follow-up to the office, repeat x-rays demonstrated a displaced tibial fracture.  He was sent to the emergency room for admission and surgical treatment of his fracture.  Patient is seen in the ER, room 1 with Dr. Charna Archer from the emergency department.  Past Medical History:  Diagnosis Date  . Anemia   . Blind   . BPH (benign prostatic hyperplasia)   . CAD (coronary artery disease)   . CHF (congestive heart failure) ([place])   . Chronic kidney disease (CKD), stage IV (severe) (HCC)    DIALYSIS  . Diabetes mellitus without complication ([place])   . ESRD (end stage renal disease) ([place])    Monday-Wednesday-Friday dialysis  . Hyperlipemia   . Hypertension   . Neuropathy   . Osteoporosis   . Pulmonary HTN ([place])   . Stroke [facility])    2013  . TIA (transient ischemic attack)   . TRD (traction retinal detachment)   . Wilms' tumor [facility])    Past Surgical History:  Procedure Laterality Date  . A/V FISTULAGRAM Left 01/28/2017   Procedure: A/V Fistulagram;  Surgeon: Algernon Huxley, MD;  Location: [place] CV LAB;  Service: Cardiovascular;  Laterality: Left;  . A/V FISTULAGRAM Left 04/28/2018   Procedure: A/V FISTULAGRAM;  Surgeon: Algernon Huxley, MD;  Location: [place] CV LAB;  Service: Cardiovascular;  Laterality: Left;  . A/V FISTULAGRAM N/A 03/21/2020   Procedure: A/V FISTULAGRAM;  Surgeon: Algernon Huxley, MD;   Location: [place] CV LAB;  Service: Cardiovascular;  Laterality: N/A;  . AV FISTULA PLACEMENT    . AV FISTULA PLACEMENT Right 11/23/2019   Procedure: ARTERIOVENOUS (AV) FISTULA CREATION (RADIOCEPHALIC );  Surgeon: Algernon Huxley, MD;  Location: [facility] ORS;  Service: Vascular;  Laterality: Right;  . CARDIAC CATHETERIZATION    . CATARACT EXTRACTION W/PHACO Right 05/14/2016   Procedure: CATARACT EXTRACTION PHACO AND INTRAOCULAR LENS PLACEMENT (IOC);  Surgeon: Eulogio Bear, MD;  Location: [facility] ORS;  Service: Ophthalmology;  Laterality: Right;  [place] 1.46 AP% 11.5 CDE 12.33 FLUID PACK LOT # [id number] H  . EYE SURGERY    . FRACTURE SURGERY     RIGHT LEG WITH ROD  . HIP SURGERY    . NEPHRECTOMY RADICAL    . PERIPHERAL VASCULAR CATHETERIZATION N/A 08/28/2015   Procedure: A/V Shuntogram/Fistulagram;  Surgeon: Algernon Huxley, MD;  Location: [place] CV LAB;  Service: Cardiovascular;  Laterality: N/A;  . PERIPHERAL VASCULAR CATHETERIZATION Left 08/28/2015   Procedure: A/V Shunt Intervention;  Surgeon: Algernon Huxley, MD;  Location: [place] CV LAB;  Service: Cardiovascular;  Laterality: Left;  . UPPER EXTREMITY ANGIOGRAPHY Right 08/01/2020   Procedure: UPPER EXTREMITY ANGIOGRAPHY;  Surgeon: Algernon Huxley, MD;  Location: [place] CV LAB;  Service: Cardiovascular;  Laterality: Right;   Social History   Socioeconomic History  . Marital status: Divorced    Spouse name: Not on file  . Number of  children: Not on file  . Years of education: Not on file  . Highest education level: Not on file  Occupational History  . Not on file  Tobacco Use  . Smoking status: Former Smoker    Packs/day: 1.00    Types: Cigarettes    Quit date: 06/26/2012    Years since quitting: 8.7  . Smokeless tobacco: Never Used  Vaping Use  . Vaping Use: Never used  Substance and Sexual Activity  . Alcohol use: No  . Drug use: No  . Sexual activity: Never  Other Topics Concern  . Not on file  Social History Narrative    Lives at home with his son. Ambulates well at baseline.   Social Determinants of Health   Financial Resource Strain: Not on file  Food Insecurity: Not on file  Transportation Needs: Not on file  Physical Activity: Not on file  Stress: Not on file  Social Connections: Not on file   Family History  Problem Relation Age of Onset  . CAD Father   . COPD Father   . CAD Paternal Grandfather   . Diabetes Maternal Aunt   . Diabetes Maternal Uncle    Allergies  Allergen Reactions  . Metformin Other (See Comments)    Severe diarrhea   . Penicillins Hives and Other (See Comments)    Has patient had a PCN reaction causing immediate rash, facial/tongue/throat swelling, SOB or lightheadedness with hypotension:Yes Has patient had a PCN reaction causing severe rash involving mucus membranes or skin necrosis:Yes Has patient had a PCN reaction that required hospitalization:inpatient at the time of reaction. Has patient had a PCN reaction occurring within the last 10 years:No If all of the above answers are "NO", then may proceed with Cephalosporin use.    Prior to Admission medications   Medication Sig Start Date End Date Taking? Authorizing Provider  amLODipine (NORVASC) 10 MG tablet Hold until followup with outpatient doctor due to intermittent low blood pressure. 02/16/21   Enzo Bi, MD  atorvastatin (LIPITOR) 80 MG tablet Take 1 tablet (80 mg total) by mouth at bedtime. 02/08/21   Enzo Bi, MD  carvedilol (COREG) 12.5 MG tablet Take 1 tablet (12.5 mg total) by mouth 2 (two) times daily. 02/16/21   Enzo Bi, MD  epoetin alfa (EPOGEN) 10000 UNIT/ML injection Inject 0.8 mLs (8,000 Units total) into the vein Every Tuesday,Thursday,and Saturday with dialysis. 02/16/21   Enzo Bi, MD  furosemide (LASIX) 40 MG tablet Hold until followup with outpatient doctor due to intermittent low blood pressure. 02/16/21   Enzo Bi, MD  guaiFENesin-dextromethorphan (ROBITUSSIN DM) 100-10 MG/5ML syrup Take 10  mLs by mouth every 6 (six) hours as needed for cough. 02/16/21   Enzo Bi, MD  hydrALAZINE (APRESOLINE) 100 MG tablet Hold until followup with outpatient doctor due to intermittent low blood pressure. 02/16/21   Enzo Bi, MD  HYDROcodone-acetaminophen (NORCO/VICODIN) 5-325 MG tablet Take 2 tablets by mouth every 6 (six) hours as needed for moderate pain or severe pain. 03/17/21   Hinda Kehr, MD  insulin glargine (LANTUS) 100 UNIT/ML injection Inject 9-10 Units into the skin daily.     [provider]  isosorbide mononitrate (IMDUR) 30 MG 24 hr tablet Hold until followup with outpatient doctor due to intermittent low blood pressure. 02/16/21   Enzo Bi, MD  lidocaine (LIDODERM) 5 % Place 1 patch onto the skin daily. 11/04/20   [provider]  pantoprazole (PROTONIX) 40 MG tablet Take 40 mg by mouth 2 (two)  times daily before a meal. 01/14/21   [provider]     Positive ROS: All other systems have been reviewed and were otherwise negative with the exception of those mentioned in the HPI and as above.  Physical Exam: General: Alert, no acute distress Cardiovascular: Regular rate and rhythm, no murmurs rubs or gallops.  No pedal edema Respiratory: Clear to auscultation bilaterally, no wheezes rales or rhonchi. No cyanosis, no use of accessory musculature GI: No organomegaly, abdomen is soft and non-tender nondistended with positive bowel sounds. Skin: Skin intact, no lesions within the operative field. Neurologic: Sensation intact distally Psychiatric: Patient is competent for consent with normal mood and affect Lymphatic: No cervical lymphadenopathy  MUSCULOSKELETAL: Left lower extremity: Patient's dressings were opened to examine the skin.  Patient has 3 small superficial abrasions over the anterior leg.  Patient has ecchymosis throughout the left leg.  He can flex and extend his toes.  He has palpable pedal pulses.  His compartments are soft and compressible.  His  foot is well-perfused.  He has intact sensation light touch.  Assessment: Left tibia-fibula fracture, displaced  Plan: Patient is being admitted to the orthopedic service.  He will continue elevation of the left lower extremity.  Patient's right main nonweightbearing on the left lower extremity.  Patient will have a hospitalist consult for preop clearance.  Labs have been ordered.  Patient will be n.p.o. after midnight.  Patient should not receive anticoagulation medication tonight in preparation for surgery tomorrow.  Patient is on the OR schedule for 1 PM tomorrow pending medical clearance.  I spoke with the patient's son by phone to explain to him the plan and reviewed the details of the operation as well as the postoperative course.  I answered all his questions.    Thornton [place], MD   03/20/2021 6:35 PM

## 2021-03-20 NOTE — ED Notes (Signed)
Pt given [place] sandwich, OR MD states pt can eat and drink because his procedure wont be done until tomorrow.

## 2021-03-20 NOTE — Consult Note (Signed)
[facility] Medical Consultation  Richard Zavala XBD:[id number] DOB: 19-Jan-1961 DOA: 03/20/2021 PCP: Midge Minium, PA   Requesting physician: Dr.Krasinski  Date of consultation: 03/20/2021  Reason for consultation: Medical management and medical clearance.  Impression/Recommendations Active Problems:   Tibia/fibula fracture, left, closed, with delayed healing, subsequent encounter  1. Tibia/Fibula fracture: Delayed healing and OR tomorrow per orthopedic plan with spinal anesthesia plan. Pt is moderate anesthesia risk with h/o smoking and suspect underlying COPD. Pt also has heart diease history and c/o left precordial chest pain and is 1/10 today NR, tender to touch and is reproducible.   2. ESRD on HD: Nephrology consult for HD requested with Dr.Kolluru.  3. Chest pain: Left side chest pain we will cycle troponin and obtain, last echo in 2019 with EF of 60-65%. Hold antiplatelet and cont coreg.BNP of 4500 and troponin of 222 we will request cardiology consult , his troponin from last week has rose to 222 from 21 and 24. EKG: S.Brady 59, Prolonged pR 216 and LVH, q waves on v1.  4.Htn: On pervious admission pt's BP meds held except for coreg. We will continue coreg.   5.CAD: Continue atorvastatin, chart review shows patient is not on any antiplatelet, resume after med rec if patient is on antiplatelets or consider starting low-dose aspirin if no contraindications as into GI bleeding, or ICH.  6. CHF: Pt's lasix held, we will monitor for weights and I/O's.  Elevated BNP will probably need HD tomorrow.   7. Anemia of c/h kidney: We will monitor CBC and continue epoetin therapy. Type and screen. IV PPI.  8.Allergy to PCN and metformin.  9.CVA: Pt on lipitor no antiplatelet.  We will followup again tomorrow. Please contact me if I can be of assistance in the meanwhile. Thank you for this consultation.  Chief Complaint: Left tibia Fracture/ Medical  management and clearance.  HPI:  Pt is 60 y/o male seen in ED as consult for left tibia #, that has gotten worse. Pt states he fell on left side from what It sounds like he fell off his bed.  Today pt is alert,awake and reports chest pain since yesterday and no other complaints except for pain.say she followed up with his cardiology February last year.  He has referral to go locally but has not seen md yet. He has not smoked in about 9 years. He does not drink alcohol.Son has been helping him lifting him and moving him at home for ADL's and otherwise because of pain.  In ed pt is A/O x 3 and EKG is negative for any st changes or TWI. Cardiology consulted for elevated troponin.  Review of Systems:  Review of Systems  Cardiovascular: Positive for chest pain.  Musculoskeletal: Positive for joint pain.  All other systems reviewed and are negative.   Past Medical History:  Diagnosis Date  . Anemia   . Blind   . BPH (benign prostatic hyperplasia)   . CAD (coronary artery disease)   . CHF (congestive heart failure) ([place])   . Chronic kidney disease (CKD), stage IV (severe) (HCC)    DIALYSIS  . Diabetes mellitus without complication ([place])   . ESRD (end stage renal disease) ([place])    Monday-Wednesday-Friday dialysis  . Hyperlipemia   . Hypertension   . Neuropathy   . Osteoporosis   . Pulmonary HTN ([place])   . Stroke [facility])    2013  . TIA (transient ischemic attack)   . TRD (traction retinal detachment)   .  Wilms' tumor [facility])    Past Surgical History:  Procedure Laterality Date  . A/V FISTULAGRAM Left 01/28/2017   Procedure: A/V Fistulagram;  Surgeon: Algernon Huxley, MD;  Location: [place] [facility];  Service: Cardiovascular;  Laterality: Left;  . A/V FISTULAGRAM Left 04/28/2018   Procedure: A/V FISTULAGRAM;  Surgeon: Algernon Huxley, MD;  Location: [place] [facility];  Service: Cardiovascular;  Laterality: Left;  . A/V FISTULAGRAM N/A 03/21/2020   Procedure: A/V FISTULAGRAM;  Surgeon:  Algernon Huxley, MD;  Location: [place] [facility];  Service: Cardiovascular;  Laterality: N/A;  . AV FISTULA PLACEMENT    . AV FISTULA PLACEMENT Right 11/23/2019   Procedure: ARTERIOVENOUS (AV) FISTULA CREATION (RADIOCEPHALIC );  Surgeon: Algernon Huxley, MD;  Location: [facility] ORS;  Service: Vascular;  Laterality: Right;  . CARDIAC CATHETERIZATION    . CATARACT EXTRACTION W/PHACO Right 05/14/2016   Procedure: CATARACT EXTRACTION PHACO AND INTRAOCULAR LENS PLACEMENT (IOC);  Surgeon: Eulogio Bear, MD;  Location: [facility] ORS;  Service: Ophthalmology;  Laterality: Right;  [place] 1.46 AP% 11.5 CDE 12.33 FLUID PACK LOT # [id number]  . EYE SURGERY    . FRACTURE SURGERY     RIGHT LEG WITH ROD  . HIP SURGERY    . NEPHRECTOMY RADICAL    . PERIPHERAL VASCULAR CATHETERIZATION N/A 08/28/2015   Procedure: A/V Shuntogram/Fistulagram;  Surgeon: Algernon Huxley, MD;  Location: [place] [facility];  Service: Cardiovascular;  Laterality: N/A;  . PERIPHERAL VASCULAR CATHETERIZATION Left 08/28/2015   Procedure: A/V Shunt Intervention;  Surgeon: Algernon Huxley, MD;  Location: [place] [facility];  Service: Cardiovascular;  Laterality: Left;  . UPPER EXTREMITY ANGIOGRAPHY Right 08/01/2020   Procedure: UPPER EXTREMITY ANGIOGRAPHY;  Surgeon: Algernon Huxley, MD;  Location: [place] [facility];  Service: Cardiovascular;  Laterality: Right;   Social History:  reports that he quit smoking about 8 years ago. His smoking use included cigarettes. He smoked 1.00 pack per day. He has never used smokeless tobacco. He reports that he does not drink alcohol and does not use drugs.  Allergies  Allergen Reactions  . Metformin Other (See Comments)    Severe diarrhea   . Penicillins Hives and Other (See Comments)    Has patient had a PCN reaction causing immediate rash, facial/tongue/throat swelling, SOB or lightheadedness with hypotension:Yes Has patient had a PCN reaction causing severe rash involving mucus membranes or skin necrosis:Yes Has  patient had a PCN reaction that required hospitalization:inpatient at the time of reaction. Has patient had a PCN reaction occurring within the last 10 years:No If all of the above answers are "NO", then may proceed with Cephalosporin use.    Family History  Problem Relation Age of Onset  . CAD Father   . COPD Father   . CAD Paternal Grandfather   . Diabetes Maternal Aunt   . Diabetes Maternal Uncle     Prior to Admission medications   Medication Sig Start Date End Date Taking? Authorizing Provider  amLODipine (NORVASC) 10 MG tablet Hold until followup with outpatient doctor due to intermittent low blood pressure. 02/16/21   Enzo Bi, MD  atorvastatin (LIPITOR) 80 MG tablet Take 1 tablet (80 mg total) by mouth at bedtime. 02/08/21   Enzo Bi, MD  carvedilol (COREG) 12.5 MG tablet Take 1 tablet (12.5 mg total) by mouth 2 (two) times daily. 02/16/21   Enzo Bi, MD  epoetin alfa (EPOGEN) 10000 UNIT/ML injection Inject 0.8 mLs (8,000 Units total) into the  vein Every Tuesday,Thursday,and Saturday with dialysis. 02/16/21   Enzo Bi, MD  furosemide (LASIX) 40 MG tablet Hold until followup with outpatient doctor due to intermittent low blood pressure. 02/16/21   Enzo Bi, MD  guaiFENesin-dextromethorphan (ROBITUSSIN DM) 100-10 MG/5ML syrup Take 10 mLs by mouth every 6 (six) hours as needed for cough. 02/16/21   Enzo Bi, MD  hydrALAZINE (APRESOLINE) 100 MG tablet Hold until followup with outpatient doctor due to intermittent low blood pressure. 02/16/21   Enzo Bi, MD  HYDROcodone-acetaminophen (NORCO/VICODIN) 5-325 MG tablet Take 2 tablets by mouth every 6 (six) hours as needed for moderate pain or severe pain. 03/17/21   Hinda Kehr, MD  insulin glargine (LANTUS) 100 UNIT/ML injection Inject 9-10 Units into the skin daily.     [provider]  isosorbide mononitrate (IMDUR) 30 MG 24 hr tablet Hold until followup with outpatient doctor due to intermittent low blood pressure. 02/16/21    Enzo Bi, MD  lidocaine (LIDODERM) 5 % Place 1 patch onto the skin daily. 11/04/20   [provider]  pantoprazole (PROTONIX) 40 MG tablet Take 40 mg by mouth 2 (two) times daily before a meal. 01/14/21   [provider]   Physical Exam: Blood pressure (!) 154/64, pulse 62, temperature 97.8 F (36.6 C), temperature source Oral, resp. rate 18, height 5\' 8"  (1.727 m), SpO2 95 %. Vitals:   03/20/21 1849 03/20/21 1854  BP:    Pulse:    Resp: 18   Temp:    SpO2:  95%    General:  [place]/AT  Eyes: EOMI, left eye blind from retinal detachment  ENT: Hearing and inspection normal.  Cardiovascular: RRR, no murmur, left precordial reproducible chest pain.  Respiratory: BL wheezing and rhonchi posteriorly.  Abdomen: Soft NT/ND.  Musculoskeletal: left leg wrapped in ace wrap.  Psychiatric: normal attention span and behavior,  but little tangential.  Neurologic: CN WNL, no focal weakness. Pulses intact.  Labs on Admission:  CMP shows elevated glucose and creatinine of 5.71 and potassium is normal. CBC shows anemic with hb of 8.4.  Radiological Exams on Admission:  EKG: Independently reviewed. SR with Q waves in lead v1.  Time spent:  45 minutes.  [facility] Pager [phone].  If 7PM-7AM, please contact night-coverage [email or web address] Password [facility] 03/20/2021, 9:07 PM

## 2021-03-21 ENCOUNTER — Encounter
Admission: EM | Disposition: A | Discharge status: Skilled Nursing Facility | Payer: Self-pay | Source: Ambulatory Visit | Attending: Orthopedic Surgery

## 2021-03-21 ENCOUNTER — Inpatient Hospital Stay: Payer: Medicare Other

## 2021-03-21 ENCOUNTER — Inpatient Hospital Stay: Payer: Medicare Other | Admitting: Certified Registered Nurse Anesthetist

## 2021-03-21 ENCOUNTER — Inpatient Hospital Stay: Admit: 2021-03-21 | Payer: Medicare Other | Admitting: Orthopedic Surgery

## 2021-03-21 ENCOUNTER — Inpatient Hospital Stay
Admit: 2021-03-21 | Discharge: 2021-03-21 | Disposition: A | Discharge status: Home / Self Care | Payer: Medicare Other | Attending: Internal Medicine | Admitting: Internal Medicine

## 2021-03-21 DIAGNOSIS — S82402G Unspecified fracture of shaft of left fibula, subsequent encounter for closed fracture with delayed healing: Secondary | ICD-10-CM | POA: Diagnosis not present

## 2021-03-21 DIAGNOSIS — S82202G Unspecified fracture of shaft of left tibia, subsequent encounter for closed fracture with delayed healing: Secondary | ICD-10-CM | POA: Diagnosis not present

## 2021-03-21 HISTORY — PX: TIBIA IM NAIL INSERTION: SHX2516

## 2021-03-21 LAB — BASIC METABOLIC PANEL
Anion gap: 11 (ref 5–15)
BUN: 52 mg/dL — ABNORMAL HIGH (ref 6–20)
CO2: 22 mmol/L (ref 22–32)
Calcium: 7.9 mg/dL — ABNORMAL LOW (ref 8.9–10.3)
Chloride: 103 mmol/L (ref 98–111)
Creatinine, Ser: 6.13 mg/dL — ABNORMAL HIGH (ref 0.61–1.24)
GFR, Estimated: 10 mL/min — ABNORMAL LOW (ref >60)
Glucose, Bld: 269 mg/dL — ABNORMAL HIGH (ref 70–99)
Potassium: 4 mmol/L (ref 3.5–5.1)
Sodium: 136 mmol/L (ref 135–145)

## 2021-03-21 LAB — ECHOCARDIOGRAM COMPLETE
AR max vel: 3.81 cm2
AV Area VTI: 4.23 cm2
AV Area mean vel: 3.65 cm2
AV Mean grad: 5 mmHg
AV Peak grad: 8.6 mmHg
Ao pk vel: 1.47 m/s
Area-P 1/2: 4.15 cm2
Height: 68 in
MV M vel: 5.91 m/s
MV Peak grad: 139.7 mmHg
MV VTI: 3.27 cm2
Radius: 0.5 cm
S' Lateral: 4 cm

## 2021-03-21 LAB — CBC
HCT: 27.8 % — ABNORMAL LOW (ref 39.0–52.0)
Hemoglobin: 8.9 g/dL — ABNORMAL LOW (ref 13.0–17.0)
MCH: 31.9 pg (ref 26.0–34.0)
MCHC: 32 g/dL (ref 30.0–36.0)
MCV: 99.6 fL (ref 80.0–100.0)
Platelets: 151 10*3/uL (ref 150–400)
RBC: 2.79 MIL/uL — ABNORMAL LOW (ref 4.22–5.81)
RDW: 18.9 % — ABNORMAL HIGH (ref 11.5–15.5)
WBC: 5 10*3/uL (ref 4.0–10.5)
nRBC: 0 % (ref 0.0–0.2)

## 2021-03-21 LAB — GLUCOSE, CAPILLARY
Glucose-Capillary: 194 mg/dL — ABNORMAL HIGH (ref 70–99)
Glucose-Capillary: 49 mg/dL — ABNORMAL LOW (ref 70–99)
Glucose-Capillary: 65 mg/dL — ABNORMAL LOW (ref 70–99)
Glucose-Capillary: 77 mg/dL (ref 70–99)
Glucose-Capillary: 109 mg/dL — ABNORMAL HIGH (ref 70–99)
Glucose-Capillary: 144 mg/dL — ABNORMAL HIGH (ref 70–99)
Glucose-Capillary: 221 mg/dL — ABNORMAL HIGH (ref 70–99)

## 2021-03-21 LAB — HEMOGLOBIN A1C
Hgb A1c MFr Bld: 7.8 % — ABNORMAL HIGH (ref 4.8–5.6)
Mean Plasma Glucose: 177.16 mg/dL

## 2021-03-21 LAB — TROPONIN I (HIGH SENSITIVITY): Troponin I (High Sensitivity): 186 ng/L (ref <18)

## 2021-03-21 SURGERY — INSERTION, INTRAMEDULLARY ROD, TIBIA
Anesthesia: General | Laterality: Left

## 2021-03-21 MED ORDER — DEXMEDETOMIDINE (PRECEDEX) IN NS 20 MCG/5ML (4 MCG/ML) IV SYRINGE
PREFILLED_SYRINGE | INTRAVENOUS | Status: DC | PRN
  Administered 2021-03-21: 8 ug via INTRAVENOUS

## 2021-03-21 MED ORDER — CLINDAMYCIN PHOSPHATE 600 MG/50ML IV SOLN
600.0000 mg | Freq: Four times a day (QID) | INTRAVENOUS | Status: AC
  Administered 2021-03-21: 600 mg via INTRAVENOUS
  Filled 2021-03-21 (×2): qty 50

## 2021-03-21 MED ORDER — CARVEDILOL 6.25 MG PO TABS
6.2500 mg | ORAL_TABLET | Freq: Two times a day (BID) | ORAL | Status: DC
  Administered 2021-03-21 – 2021-03-22 (×2): 6.25 mg via ORAL
  Filled 2021-03-21: qty 2
  Filled 2021-03-21 (×3): qty 1
  Filled 2021-03-21: qty 2

## 2021-03-21 MED ORDER — DEXAMETHASONE SODIUM PHOSPHATE 10 MG/ML IJ SOLN
INTRAMUSCULAR | Status: AC
  Filled 2021-03-21: qty 1

## 2021-03-21 MED ORDER — ATORVASTATIN CALCIUM 80 MG PO TABS
80.0000 mg | ORAL_TABLET | Freq: Every day | ORAL | Status: DC
  Administered 2021-03-21 – 2021-03-24 (×4): 80 mg via ORAL
  Filled 2021-03-21: qty 1
  Filled 2021-03-21: qty 4
  Filled 2021-03-21: qty 1
  Filled 2021-03-21 (×3): qty 4
  Filled 2021-03-21 (×3): qty 1

## 2021-03-21 MED ORDER — METHOCARBAMOL 1000 MG/10ML IJ SOLN
500.0000 mg | Freq: Four times a day (QID) | INTRAVENOUS | Status: DC | PRN
  Filled 2021-03-21: qty 5

## 2021-03-21 MED ORDER — LIDOCAINE HCL (CARDIAC) PF 100 MG/5ML IV SOSY
PREFILLED_SYRINGE | INTRAVENOUS | Status: DC | PRN
  Administered 2021-03-21: 40 mg via INTRAVENOUS

## 2021-03-21 MED ORDER — METHOCARBAMOL 500 MG PO TABS: 500.0000 mg | ORAL_TABLET | Freq: Four times a day (QID) | ORAL | Status: DC | PRN

## 2021-03-21 MED ORDER — ALTEPLASE 2 MG IJ SOLR
2.0000 mg | Freq: Once | INTRAMUSCULAR | Status: DC | PRN
  Filled 2021-03-21: qty 2

## 2021-03-21 MED ORDER — ONDANSETRON HCL 4 MG PO TABS
4.0000 mg | ORAL_TABLET | Freq: Four times a day (QID) | ORAL | Status: DC | PRN
  Administered 2021-03-25: 4 mg via ORAL
  Filled 2021-03-21: qty 1

## 2021-03-21 MED ORDER — SODIUM CHLORIDE 0.9 % IV SOLN
100.0000 mL | INTRAVENOUS | Status: DC | PRN
Start: 2021-03-21 — End: 2021-03-22

## 2021-03-21 MED ORDER — SODIUM CHLORIDE 0.9 % IV SOLN: 100.0000 mL | INTRAVENOUS | Status: DC | PRN

## 2021-03-21 MED ORDER — ACETAMINOPHEN 10 MG/ML IV SOLN
INTRAVENOUS | Status: DC | PRN
  Administered 2021-03-21: 1000 mg via INTRAVENOUS

## 2021-03-21 MED ORDER — DEXTROSE 50 % IV SOLN
25.0000 mL | Freq: Once | INTRAVENOUS | Status: AC
  Administered 2021-03-21: 25 mL via INTRAVENOUS

## 2021-03-21 MED ORDER — ACETAMINOPHEN 325 MG PO TABS
325.0000 mg | ORAL_TABLET | Freq: Four times a day (QID) | ORAL | Status: DC | PRN
  Administered 2021-03-22 – 2021-03-23 (×2): 650 mg via ORAL
  Filled 2021-03-21 (×2): qty 2

## 2021-03-21 MED ORDER — ROCURONIUM BROMIDE 10 MG/ML (PF) SYRINGE
PREFILLED_SYRINGE | INTRAVENOUS | Status: AC
  Filled 2021-03-21: qty 10

## 2021-03-21 MED ORDER — SENNA 8.6 MG PO TABS
1.0000 | ORAL_TABLET | Freq: Two times a day (BID) | ORAL | Status: DC
  Administered 2021-03-21 – 2021-03-25 (×8): 8.6 mg via ORAL
  Filled 2021-03-21 (×9): qty 1

## 2021-03-21 MED ORDER — ONDANSETRON HCL 4 MG/2ML IJ SOLN
INTRAMUSCULAR | Status: DC | PRN
  Administered 2021-03-21: 4 mg via INTRAVENOUS

## 2021-03-21 MED ORDER — DEXAMETHASONE SODIUM PHOSPHATE 10 MG/ML IJ SOLN
INTRAMUSCULAR | Status: DC | PRN
  Administered 2021-03-21: 10 mg via INTRAVENOUS

## 2021-03-21 MED ORDER — ONDANSETRON HCL 4 MG/2ML IJ SOLN: 4.0000 mg | Freq: Four times a day (QID) | INTRAMUSCULAR | Status: DC | PRN

## 2021-03-21 MED ORDER — MIDAZOLAM HCL 2 MG/2ML IJ SOLN
INTRAMUSCULAR | Status: DC | PRN
  Administered 2021-03-21: 2 mg via INTRAVENOUS

## 2021-03-21 MED ORDER — EPHEDRINE SULFATE 50 MG/ML IJ SOLN
INTRAMUSCULAR | Status: DC | PRN
  Administered 2021-03-21: 10 mg via INTRAVENOUS
  Administered 2021-03-21: 20 mg via INTRAVENOUS

## 2021-03-21 MED ORDER — MIDAZOLAM HCL 2 MG/2ML IJ SOLN
INTRAMUSCULAR | Status: AC
  Filled 2021-03-21: qty 2

## 2021-03-21 MED ORDER — DEXTROSE 50 % IV SOLN
INTRAVENOUS | Status: AC
  Administered 2021-03-21: 25 mL via INTRAVENOUS
  Filled 2021-03-21: qty 50

## 2021-03-21 MED ORDER — ONDANSETRON HCL 4 MG/2ML IJ SOLN
INTRAMUSCULAR | Status: AC
  Filled 2021-03-21: qty 2

## 2021-03-21 MED ORDER — NEOMYCIN-POLYMYXIN B GU 40-200000 IR SOLN
Status: DC | PRN
  Administered 2021-03-21: 4 mL

## 2021-03-21 MED ORDER — OXYCODONE HCL 5 MG PO TABS: 10.0000 mg | ORAL_TABLET | ORAL | Status: DC | PRN

## 2021-03-21 MED ORDER — CHLORHEXIDINE GLUCONATE CLOTH 2 % EX PADS
6.0000 | MEDICATED_PAD | Freq: Every day | CUTANEOUS | Status: DC
  Administered 2021-03-21: 6 via TOPICAL

## 2021-03-21 MED ORDER — LIDOCAINE HCL (PF) 2 % IJ SOLN
INTRAMUSCULAR | Status: AC
  Filled 2021-03-21: qty 5

## 2021-03-21 MED ORDER — ALBUTEROL SULFATE HFA 108 (90 BASE) MCG/ACT IN AERS
2.0000 | INHALATION_SPRAY | Freq: Four times a day (QID) | RESPIRATORY_TRACT | Status: DC | PRN
  Filled 2021-03-21: qty 6.7

## 2021-03-21 MED ORDER — HYDROMORPHONE HCL 1 MG/ML IJ SOLN: 0.5000 mg | INTRAMUSCULAR | Status: DC | PRN

## 2021-03-21 MED ORDER — ACETAMINOPHEN 10 MG/ML IV SOLN
INTRAVENOUS | Status: AC
  Filled 2021-03-21: qty 100

## 2021-03-21 MED ORDER — LIDOCAINE HCL (PF) 1 % IJ SOLN
5.0000 mL | INTRAMUSCULAR | Status: DC | PRN
  Filled 2021-03-21: qty 5

## 2021-03-21 MED ORDER — HEPARIN SODIUM (PORCINE) 1000 UNIT/ML DIALYSIS
1000.0000 [IU] | INTRAMUSCULAR | Status: DC | PRN
  Filled 2021-03-21: qty 1

## 2021-03-21 MED ORDER — HEPARIN SODIUM (PORCINE) 5000 UNIT/ML IJ SOLN
5000.0000 [IU] | Freq: Three times a day (TID) | INTRAMUSCULAR | Status: DC
  Administered 2021-03-22 – 2021-03-25 (×11): 5000 [IU] via SUBCUTANEOUS
  Filled 2021-03-21 (×11): qty 1

## 2021-03-21 MED ORDER — ACETAMINOPHEN 500 MG PO TABS
1000.0000 mg | ORAL_TABLET | Freq: Four times a day (QID) | ORAL | Status: AC
  Administered 2021-03-21 – 2021-03-22 (×3): 1000 mg via ORAL
  Filled 2021-03-21 (×3): qty 2

## 2021-03-21 MED ORDER — ENOXAPARIN SODIUM 30 MG/0.3ML ~~LOC~~ SOLN: 30.0000 mg | SUBCUTANEOUS | Status: DC

## 2021-03-21 MED ORDER — DEXTROSE IN LACTATED RINGERS 5 % IV SOLN: INTRAVENOUS | Status: DC | PRN

## 2021-03-21 MED ORDER — CARVEDILOL 12.5 MG PO TABS: 12.5000 mg | ORAL_TABLET | Freq: Two times a day (BID) | ORAL | Status: DC

## 2021-03-21 MED ORDER — FENTANYL CITRATE (PF) 100 MCG/2ML IJ SOLN
INTRAMUSCULAR | Status: AC
  Filled 2021-03-21: qty 2

## 2021-03-21 MED ORDER — BISACODYL 10 MG RE SUPP: 10.0000 mg | Freq: Every day | RECTAL | Status: DC | PRN

## 2021-03-21 MED ORDER — FENTANYL CITRATE (PF) 100 MCG/2ML IJ SOLN: 25.0000 ug | INTRAMUSCULAR | Status: DC | PRN

## 2021-03-21 MED ORDER — EPOETIN ALFA 10000 UNIT/ML IJ SOLN
10000.0000 [IU] | INTRAMUSCULAR | Status: DC
  Administered 2021-03-24: 10000 [IU] via INTRAVENOUS
  Filled 2021-03-21: qty 1

## 2021-03-21 MED ORDER — NEOMYCIN-POLYMYXIN B GU 40-200000 IR SOLN
Status: AC
  Filled 2021-03-21: qty 20

## 2021-03-21 MED ORDER — LIDOCAINE-PRILOCAINE 2.5-2.5 % EX CREA
1.0000 "application " | TOPICAL_CREAM | CUTANEOUS | Status: DC | PRN
  Filled 2021-03-21: qty 5

## 2021-03-21 MED ORDER — PROPOFOL 10 MG/ML IV BOLUS
INTRAVENOUS | Status: AC
  Filled 2021-03-21: qty 20

## 2021-03-21 MED ORDER — LABETALOL HCL 5 MG/ML IV SOLN
INTRAVENOUS | Status: DC | PRN
  Administered 2021-03-21: 5 mg via INTRAVENOUS

## 2021-03-21 MED ORDER — ONDANSETRON HCL 4 MG/2ML IJ SOLN: 4.0000 mg | Freq: Once | INTRAMUSCULAR | Status: DC | PRN

## 2021-03-21 MED ORDER — ALUM & MAG HYDROXIDE-SIMETH 200-200-20 MG/5ML PO SUSP: 30.0000 mL | ORAL | Status: DC | PRN

## 2021-03-21 MED ORDER — AMLODIPINE BESYLATE 10 MG PO TABS
10.0000 mg | ORAL_TABLET | Freq: Every day | ORAL | Status: DC
  Administered 2021-03-21 – 2021-03-22 (×2): 10 mg via ORAL
  Filled 2021-03-21 (×2): qty 1

## 2021-03-21 MED ORDER — CLINDAMYCIN PHOSPHATE 600 MG/50ML IV SOLN
INTRAVENOUS | Status: DC | PRN
  Administered 2021-03-21: 600 mg via INTRAVENOUS

## 2021-03-21 MED ORDER — PENTAFLUOROPROP-TETRAFLUOROETH EX AERO
1.0000 | INHALATION_SPRAY | CUTANEOUS | Status: DC | PRN
Start: 2021-03-21 — End: 2021-03-22
  Filled 2021-03-21: qty 30

## 2021-03-21 MED ORDER — INSULIN ASPART 100 UNIT/ML ~~LOC~~ SOLN
0.0000 [IU] | SUBCUTANEOUS | Status: DC
  Administered 2021-03-21: 3 [IU] via SUBCUTANEOUS
  Administered 2021-03-21: 5 [IU] via SUBCUTANEOUS
  Administered 2021-03-22: 11 [IU] via SUBCUTANEOUS
  Administered 2021-03-22: 8 [IU] via SUBCUTANEOUS
  Administered 2021-03-22 (×2): 5 [IU] via SUBCUTANEOUS
  Administered 2021-03-22: 8 [IU] via SUBCUTANEOUS
  Filled 2021-03-21 (×9): qty 1

## 2021-03-21 MED ORDER — MENTHOL 3 MG MT LOZG
1.0000 | LOZENGE | OROMUCOSAL | Status: DC | PRN
  Filled 2021-03-21: qty 9

## 2021-03-21 MED ORDER — OXYCODONE HCL 5 MG PO TABS: 5.0000 mg | ORAL_TABLET | ORAL | Status: DC | PRN

## 2021-03-21 MED ORDER — CLINDAMYCIN PHOSPHATE 600 MG/50ML IV SOLN
INTRAVENOUS | Status: AC
  Administered 2021-03-21: 600 mg via INTRAVENOUS
  Filled 2021-03-21: qty 50

## 2021-03-21 MED ORDER — TRAMADOL HCL 50 MG PO TABS
50.0000 mg | ORAL_TABLET | Freq: Four times a day (QID) | ORAL | Status: DC
  Administered 2021-03-21 – 2021-03-25 (×13): 50 mg via ORAL
  Filled 2021-03-21 (×13): qty 1

## 2021-03-21 MED ORDER — SUCCINYLCHOLINE CHLORIDE 20 MG/ML IJ SOLN
INTRAMUSCULAR | Status: DC | PRN
  Administered 2021-03-21: 120 mg via INTRAVENOUS

## 2021-03-21 MED ORDER — POLYETHYLENE GLYCOL 3350 17 G PO PACK
17.0000 g | PACK | Freq: Every day | ORAL | Status: DC | PRN
  Administered 2021-03-24 – 2021-03-25 (×2): 17 g via ORAL
  Filled 2021-03-21 (×2): qty 1

## 2021-03-21 MED ORDER — PROPOFOL 10 MG/ML IV BOLUS
INTRAVENOUS | Status: DC | PRN
  Administered 2021-03-21: 80 mg via INTRAVENOUS

## 2021-03-21 MED ORDER — PHENOL 1.4 % MT LIQD
1.0000 | OROMUCOSAL | Status: DC | PRN
  Filled 2021-03-21: qty 177

## 2021-03-21 MED ORDER — FENTANYL CITRATE (PF) 100 MCG/2ML IJ SOLN
INTRAMUSCULAR | Status: DC | PRN
  Administered 2021-03-21 (×2): 25 ug via INTRAVENOUS

## 2021-03-21 MED ORDER — DEXTROSE 50 % IV SOLN: 25.0000 mL | Freq: Once | INTRAVENOUS | Status: AC

## 2021-03-21 SURGICAL SUPPLY — 64 items
BIT DRILL 3.8X6 NS (BIT) ×2 IMPLANT
BIT DRILL 4.4 NS (BIT) ×2 IMPLANT
BIT DRILL CAL 3.2 LONG (BIT) ×2 IMPLANT
BNDG ELASTIC 4X5.8 VLCR NS LF (GAUZE/BANDAGES/DRESSINGS) ×2 IMPLANT
BNDG ELASTIC 4X5.8 VLCR STR LF (GAUZE/BANDAGES/DRESSINGS) ×2 IMPLANT
BNDG ELASTIC 6X5.8 VLCR NS LF (GAUZE/BANDAGES/DRESSINGS) ×2 IMPLANT
BNDG ELASTIC 6X5.8 VLCR STR LF (GAUZE/BANDAGES/DRESSINGS) ×2 IMPLANT
CANISTER SUCT 1200ML W/VALVE (MISCELLANEOUS) ×2 IMPLANT
CAST PADDING 6X4YD ST 30248 (SOFTGOODS) ×1
COVER WAND RF STERILE (DRAPES) ×2 IMPLANT
CUFF TOURN SGL QUICK 24 (TOURNIQUET CUFF)
CUFF TOURN SGL QUICK 30 (TOURNIQUET CUFF)
CUFF TRNQT CYL 24X4X16.5-23 (TOURNIQUET CUFF) IMPLANT
CUFF TRNQT CYL 30X4X21-28X (TOURNIQUET CUFF) IMPLANT
DRAPE 3/4 80X56 (DRAPES) ×4 IMPLANT
DRAPE C-ARM XRAY 36X54 (DRAPES) ×2 IMPLANT
DRAPE C-ARMOR (DRAPES) ×2 IMPLANT
DURAPREP 26ML APPLICATOR (WOUND CARE) ×4 IMPLANT
ELECT REM PT RETURN 9FT ADLT (ELECTROSURGICAL) ×2
ELECTRODE REM PT RTRN 9FT ADLT (ELECTROSURGICAL) ×1 IMPLANT
GAUZE SPONGE 4X4 12PLY STRL (GAUZE/BANDAGES/DRESSINGS) ×2 IMPLANT
GAUZE XEROFORM 1X8 LF (GAUZE/BANDAGES/DRESSINGS) ×2 IMPLANT
GLOVE SURG 9.0 ORTHO LTXF (GLOVE) ×4 IMPLANT
GLOVE SURG UNDER POLY LF SZ9 (GLOVE) ×2 IMPLANT
GOWN STRL REUS TWL 2XL XL LVL4 (GOWN DISPOSABLE) ×2 IMPLANT
GOWN STRL REUS W/ TWL LRG LVL3 (GOWN DISPOSABLE) ×1 IMPLANT
GOWN STRL REUS W/TWL LRG LVL3 (GOWN DISPOSABLE) ×1
GUIDEPIN 3.2X17.5 THRD DISP (PIN) ×2 IMPLANT
GUIDEWIRE BALL NOSE 80CM (WIRE) ×2 IMPLANT
KIT TURNOVER KIT A (KITS) ×2 IMPLANT
MANIFOLD NEPTUNE II (INSTRUMENTS) ×2 IMPLANT
MAT ABSORB  FLUID 56X50 GRAY (MISCELLANEOUS)
MAT ABSORB FLUID 56X50 GRAY (MISCELLANEOUS) IMPLANT
NAIL TIBIAL 10MMX33CM (Nail) ×2 IMPLANT
NEEDLE FILTER BLUNT 18X 1/2SAF (NEEDLE) ×1
NEEDLE FILTER BLUNT 18X1 1/2 (NEEDLE) ×1 IMPLANT
NS IRRIG 1000ML POUR BTL (IV SOLUTION) ×2 IMPLANT
PACK TOTAL KNEE (MISCELLANEOUS) ×2 IMPLANT
PAD ABD DERMACEA PRESS 5X9 (GAUZE/BANDAGES/DRESSINGS) ×2 IMPLANT
PADDING CAST 6X4YD NS (MISCELLANEOUS) ×2
PADDING CAST BLEND 4X4 NS (MISCELLANEOUS) ×4 IMPLANT
PADDING CAST COTTON 6X4 NS (MISCELLANEOUS) ×2 IMPLANT
PADDING CAST COTTON 6X4 ST (SOFTGOODS) ×1 IMPLANT
REAMER ROD DEEP FLUTE 2.5X950 (INSTRUMENTS) ×2 IMPLANT
SCREW ACECAP 38MM (Screw) ×2 IMPLANT
SCREW ACECAP 40MM (Screw) ×2 IMPLANT
SCREW ACECAP 44MM (Screw) ×2 IMPLANT
SCREW ACECAP 48MM (Screw) ×2 IMPLANT
SCREW LCKING CORT 5.5X30 NSTRL (Screw) ×2 IMPLANT
SCREW PROXIMAL DEPUY (Screw) ×1 IMPLANT
SCREW PRXML FT 40X5.5XNS LF (Screw) ×1 IMPLANT
SPLINT CAST 1 STEP 4X30 (MISCELLANEOUS) ×4 IMPLANT
SPLINT CAST 1 STEP 5X30 WHT (MISCELLANEOUS) ×4 IMPLANT
STAPLER SKIN PROX 35W (STAPLE) ×2 IMPLANT
STRIP CLOSURE SKIN 1/2X4 (GAUZE/BANDAGES/DRESSINGS) ×2 IMPLANT
SUT ETHILON 4-0 (SUTURE) ×1
SUT ETHILON 4-0 FS2 18XMFL BLK (SUTURE) ×1
SUT MNCRL AB 3-0 PS2 27 (SUTURE) ×2 IMPLANT
SUT VIC AB 0 CT2 27 (SUTURE) ×2 IMPLANT
SUT VIC AB 2-0 CT2 27 (SUTURE) ×2 IMPLANT
SUTURE ETHLN 4-0 FS2 18XMF BLK (SUTURE) ×1 IMPLANT
SYR 10ML LL (SYRINGE) ×2 IMPLANT
TAPE STRIPS DRAPE STRL (GAUZE/BANDAGES/DRESSINGS) ×2 IMPLANT
TOWEL OR 17X26 4PK STRL BLUE (TOWEL DISPOSABLE) ×6 IMPLANT

## 2021-03-21 NOTE — Anesthesia Preprocedure Evaluation (Signed)
Anesthesia Evaluation  Patient identified by MRN, date of birth, ID band Patient awake    Reviewed: Allergy & Precautions, H&P , NPO status , Patient's Chart, lab work & pertinent test results, reviewed documented beta blocker date and time   Airway Mallampati: III  TM Distance: >3 FB Neck ROM: full    Dental  (+) Teeth Intact   Pulmonary pneumonia, resolved, former smoker,    Pulmonary exam normal        Cardiovascular Exercise Tolerance: Poor hypertension, On Medications + CAD, + Peripheral Vascular Disease and +CHF  Normal cardiovascular exam Rhythm:regular Rate:Normal     Neuro/Psych TIA Neuromuscular disease CVA negative psych ROS   GI/Hepatic negative GI ROS, Neg liver ROS,   Endo/Other  negative endocrine ROSdiabetes, Poorly Controlled, Type 1, Insulin Dependent  Renal/GU Renal disease  negative genitourinary   Musculoskeletal   Abdominal   Peds  Hematology  (+) Blood dyscrasia, anemia ,   Anesthesia Other Findings Past Medical History: No date: Anemia No date: Blind No date: BPH (benign prostatic hyperplasia) No date: CAD (coronary artery disease) No date: CHF (congestive heart failure) (HCC) No date: Chronic kidney disease (CKD), stage IV (severe) (HCC)     Comment:  DIALYSIS No date: Diabetes mellitus without complication (HCC) No date: ESRD (end stage renal disease) (HCC)     Comment:  Monday-Wednesday-Friday dialysis No date: Hyperlipemia No date: Hypertension No date: Neuropathy No date: Osteoporosis No date: Pulmonary HTN ([place]) No date: Stroke [facility])     Comment:  2013 No date: TIA (transient ischemic attack) No date: TRD (traction retinal detachment) No date: Wilms' tumor [facility]) Past Surgical History: 01/28/2017: A/V FISTULAGRAM; Left     Comment:  Procedure: A/V Fistulagram;  Surgeon: Algernon Huxley, MD;                Location: [place] [facility];  Service: Cardiovascular;               Laterality: Left; 04/28/2018: A/V FISTULAGRAM; Left     Comment:  Procedure: A/V FISTULAGRAM;  Surgeon: Algernon Huxley, MD;               Location: [place] [facility];  Service: Cardiovascular;              Laterality: Left; 03/21/2020: A/V FISTULAGRAM; N/A     Comment:  Procedure: A/V FISTULAGRAM;  Surgeon: Algernon Huxley, MD;               Location: [place] [facility];  Service: Cardiovascular;              Laterality: N/A; No date: AV FISTULA PLACEMENT 11/23/2019: AV FISTULA PLACEMENT; Right     Comment:  Procedure: ARTERIOVENOUS (AV) FISTULA CREATION               (RADIOCEPHALIC );  Surgeon: Algernon Huxley, MD;  Location:               [facility] ORS;  Service: Vascular;  Laterality: Right; No date: CARDIAC CATHETERIZATION 05/14/2016: CATARACT EXTRACTION W/PHACO; Right     Comment:  Procedure: CATARACT EXTRACTION PHACO AND INTRAOCULAR               LENS PLACEMENT ([place]);  Surgeon: Eulogio Bear, MD;                Location: [facility] ORS;  Service: Ophthalmology;  Laterality:              Right;  [place] 1.46 AP% 11.5 CDE 12.33 FLUID PACK LOT #               [id number] H No date: EYE SURGERY No date: FRACTURE SURGERY     Comment:  RIGHT LEG WITH ROD No date: HIP SURGERY No date: NEPHRECTOMY RADICAL 08/28/2015: PERIPHERAL VASCULAR CATHETERIZATION; N/A     Comment:  Procedure: A/V Shuntogram/Fistulagram;  Surgeon: Algernon Huxley, MD;  Location: [place] [facility];  Service:               Cardiovascular;  Laterality: N/A; 08/28/2015: PERIPHERAL VASCULAR CATHETERIZATION; Left     Comment:  Procedure: A/V Shunt Intervention;  Surgeon: Algernon Huxley, MD;  Location: [place] [facility];  Service:               Cardiovascular;  Laterality: Left; 08/01/2020: UPPER EXTREMITY ANGIOGRAPHY; Right     Comment:  Procedure: UPPER EXTREMITY ANGIOGRAPHY;  Surgeon: Algernon Huxley, MD;  Location: [place] [facility];  Service:               Cardiovascular;  Laterality: Right; BMI     Body Mass Index: 24.07 kg/m     Reproductive/Obstetrics negative OB ROS                             Anesthesia Physical Anesthesia Plan  ASA: III and emergent  Anesthesia Plan: General ETT   Post-op Pain Management:    Induction:   PONV Risk Score and Plan:   Airway Management Planned:   Additional Equipment:   Intra-op Plan:   Post-operative Plan:   Informed Consent: I have reviewed the patients History and Physical, chart, labs and discussed the procedure including the risks, benefits and alternatives for the proposed anesthesia with the patient or authorized representative who has indicated his/her understanding and acceptance.     Dental Advisory Given  Plan Discussed with: CRNA  Anesthesia Plan Comments: (Pt. Refuses Regional anesthesia.  Preop sugar treated with D50. ja)        Anesthesia Quick Evaluation

## 2021-03-21 NOTE — Progress Notes (Signed)
Subjective:  Patient seen in the preoperative area.  He has been cleared by cardiology for surgery.  Objective:   VITALS:   Vitals:   03/21/21 0454 03/21/21 0752 03/21/21 1131 03/21/21 1230  BP: (!) 171/64 (!) 158/53 (!) 167/64 (!) 165/66  Pulse: 63 64 62 64  Resp: 20 19 19 12   Temp: 98.1 F (36.7 C) 98.3 F (36.8 C) 98.4 F (36.9 C) 98.3 F (36.8 C)  TempSrc:    Temporal  SpO2: 91% 94% 93% 92%  Height:        PHYSICAL EXAM: Left lower extremity: Splint in place Neurovascular intact Sensation intact distally Compartment soft  LABS  Results for orders placed or performed during the hospital encounter of 03/20/21 (from the past 24 hour(s))  Resp Panel by RT-PCR (Flu A&B, Covid) Nasopharyngeal Swab     Status: None   Collection Time: 03/20/21  6:17 PM   Specimen: Nasopharyngeal Swab; Nasopharyngeal(NP) swabs in vial transport medium  Result Value Ref Range   SARS Coronavirus 2 by RT PCR NEGATIVE NEGATIVE   Influenza A by PCR NEGATIVE NEGATIVE   Influenza B by PCR NEGATIVE NEGATIVE  Protime-INR     Status: Abnormal   Collection Time: 03/20/21  6:18 PM  Result Value Ref Range   Prothrombin Time 16.4 (H) 11.4 - 15.2 seconds   INR 1.4 (H) 0.8 - 1.2  CBC WITH DIFFERENTIAL     Status: Abnormal   Collection Time: 03/20/21  6:18 PM  Result Value Ref Range   WBC 6.2 4.0 - 10.5 K/uL   RBC 2.68 (L) 4.22 - 5.81 MIL/uL   Hemoglobin 8.4 (L) 13.0 - 17.0 g/dL   HCT 26.6 (L) 39.0 - 52.0 %   MCV 99.3 80.0 - 100.0 fL   MCH 31.3 26.0 - 34.0 pg   MCHC 31.6 30.0 - 36.0 g/dL   RDW 19.0 (H) 11.5 - 15.5 %   Platelets 146 (L) 150 - 400 K/uL   nRBC 0.0 0.0 - 0.2 %   Neutrophils Relative % 71 %   Neutro Abs 4.3 1.7 - 7.7 K/uL   Lymphocytes Relative 12 %   Lymphs Abs 0.8 0.7 - 4.0 K/uL   Monocytes Relative 15 %   Monocytes Absolute 0.9 0.1 - 1.0 K/uL   Eosinophils Relative 2 %   Eosinophils Absolute 0.1 0.0 - 0.5 K/uL   Basophils Relative 0 %   Basophils Absolute 0.0 0.0 - 0.1  K/uL   Immature Granulocytes 0 %   Abs Immature Granulocytes 0.02 0.00 - 0.07 K/uL  Comprehensive metabolic panel     Status: Abnormal   Collection Time: 03/20/21  6:18 PM  Result Value Ref Range   Sodium 137 135 - 145 mmol/L   Potassium 3.8 3.5 - 5.1 mmol/L   Chloride 102 98 - 111 mmol/L   CO2 22 22 - 32 mmol/L   Glucose, Bld 222 (H) 70 - 99 mg/dL   BUN 47 (H) 6 - 20 mg/dL   Creatinine, Ser 5.71 (H) 0.61 - 1.24 mg/dL   Calcium 8.0 (L) 8.9 - 10.3 mg/dL   Total Protein 6.0 (L) 6.5 - 8.1 g/dL   Albumin 2.6 (L) 3.5 - 5.0 g/dL   AST 19 15 - 41 U/L   ALT 19 0 - 44 U/L   Alkaline Phosphatase 115 38 - 126 U/L   Total Bilirubin 1.1 0.3 - 1.2 mg/dL   GFR, Estimated 11 (L) >60 mL/min   Anion gap 13 5 - 15  APTT     Status: Abnormal   Collection Time: 03/20/21  6:18 PM  Result Value Ref Range   aPTT 43 (H) 24 - 36 seconds  Type and screen If not already done in ED     Status: None   Collection Time: 03/20/21  6:18 PM  Result Value Ref Range   ABO/RH(D) O POS    Antibody Screen NEG    Sample Expiration      03/23/2021,2359 Performed at [facility], [address] [place], [place] [address]   D-dimer, quantitative     Status: Abnormal   Collection Time: 03/20/21  6:27 PM  Result Value Ref Range   D-Dimer, Quant 2.23 (H) 0.00 - 0.50 ug/mL-FEU  Troponin I (High Sensitivity)     Status: Abnormal   Collection Time: 03/20/21  7:16 PM  Result Value Ref Range   Troponin I (High Sensitivity) 222 (HH) <18 ng/L  Brain natriuretic peptide     Status: Abnormal   Collection Time: 03/20/21  7:16 PM  Result Value Ref Range   B Natriuretic Peptide >4,500.0 (H) 0.0 - 100.0 pg/mL  Troponin I (High Sensitivity)     Status: Abnormal   Collection Time: 03/20/21  9:38 PM  Result Value Ref Range   Troponin I (High Sensitivity) 186 (HH) <18 ng/L  Troponin I (High Sensitivity)     Status: Abnormal   Collection Time: 03/20/21 11:07 PM  Result Value Ref Range   Troponin I (High Sensitivity)  186 (HH) <18 ng/L  CBC     Status: Abnormal   Collection Time: 03/21/21  4:32 AM  Result Value Ref Range   WBC 5.0 4.0 - 10.5 K/uL   RBC 2.79 (L) 4.22 - 5.81 MIL/uL   Hemoglobin 8.9 (L) 13.0 - 17.0 g/dL   HCT 27.8 (L) 39.0 - 52.0 %   MCV 99.6 80.0 - 100.0 fL   MCH 31.9 26.0 - 34.0 pg   MCHC 32.0 30.0 - 36.0 g/dL   RDW 18.9 (H) 11.5 - 15.5 %   Platelets 151 150 - 400 K/uL   nRBC 0.0 0.0 - 0.2 %  Basic metabolic panel     Status: Abnormal   Collection Time: 03/21/21  4:32 AM  Result Value Ref Range   Sodium 136 135 - 145 mmol/L   Potassium 4.0 3.5 - 5.1 mmol/L   Chloride 103 98 - 111 mmol/L   CO2 22 22 - 32 mmol/L   Glucose, Bld 269 (H) 70 - 99 mg/dL   BUN 52 (H) 6 - 20 mg/dL   Creatinine, Ser 6.13 (H) 0.61 - 1.24 mg/dL   Calcium 7.9 (L) 8.9 - 10.3 mg/dL   GFR, Estimated 10 (L) >60 mL/min   Anion gap 11 5 - 15  Glucose, capillary     Status: Abnormal   Collection Time: 03/21/21  8:54 AM  Result Value Ref Range   Glucose-Capillary 194 (H) 70 - 99 mg/dL  Glucose, capillary     Status: Abnormal   Collection Time: 03/21/21 12:33 PM  Result Value Ref Range   Glucose-Capillary 49 (L) 70 - 99 mg/dL  Glucose, capillary     Status: Abnormal   Collection Time: 03/21/21  1:08 PM  Result Value Ref Range   Glucose-Capillary 65 (L) 70 - 99 mg/dL    DG Chest 1 View  Result Date: 03/20/2021 CLINICAL DATA:  Chest pain leg fracture EXAM: CHEST  1 VIEW COMPARISON:  02/12/2021 FINDINGS: Cardiomegaly with vascular congestion and mild  interstitial edema. No consolidation or pneumothorax IMPRESSION: Cardiomegaly with vascular congestion and mild interstitial edema. Electronically Signed   By: Donavan Foil M.D.   On: 03/20/2021 19:43   DG Tibia/Fibula Left  Result Date: 03/20/2021 CLINICAL DATA:  Fall with fracture on Sunday night.  Re-injured. EXAM: LEFT TIBIA AND FIBULA - 2 VIEW COMPARISON:  Radiograph 03/16/2021 FINDINGS: Displaced spiral fracture of the distal tibial shaft has increased  displacement from prior exam. No evidence of intra-articular extension, no ankle joint effusion. There is both a healed remote is well as acute fracture of the fibular neck. Acute component was not seen on prior. There is posterior splint material in place. Bones diffusely under mineralized. IMPRESSION: 1. Displaced spiral fracture of the distal tibial shaft, increased displacement from prior exam. 2. New acute fracture of the proximal fibular neck. This was not seen on prior exam, there is a chronic healed proximal fibular fracture. Electronically Signed   By: Melanie  Sanford M.D.   On: 03/20/2021 19:26   ECHOCARDIOGRAM COMPLETE  Result Date: 03/21/2021    ECHOCARDIOGRAM REPORT   Patient Name:   Richard Zavala Date of Exam: 03/21/2021 Medical Rec #:  [id number]       Height:       68.0 in Accession #:    [id number]      Weight:       158.3 lb Date of Birth:  09/06/1961       BSA:          1.850 m Patient Age:    60 years        BP:           158/53 mmHg Patient Gender: M               HR:           62  bpm. Exam Location:  [facility] Procedure: 2D Echo, Color Doppler, Cardiac Doppler and Strain Analysis Indications:     G18.29 CHF-Acute Systolic  History:         Patient has prior history of Echocardiogram examinations. CHF,                  CAD, TIA and CKD; Risk Factors:Hypertension and Dyslipidemia.  Sonographer:     Charmayne Sheer RDCS (AE) Referring Phys:  [place] Diagnosing Phys: Isaias Cowman MD IMPRESSIONS  1. Left ventricular ejection fraction, by estimation, is 55 to 60%. The left ventricle has normal function. The left ventricle has no regional wall motion abnormalities. Left ventricular diastolic parameters were normal.  2. Right ventricular systolic function is normal. The right ventricular size is normal.  3. The mitral valve is normal in structure. Mild to moderate mitral valve regurgitation. No evidence of mitral stenosis.  4. Tricuspid valve regurgitation is mild to moderate. Mild  tricuspid stenosis.  5. The aortic valve is normal in structure. Aortic valve regurgitation is not visualized. Mild to moderate aortic valve sclerosis/calcification is present, without any evidence of aortic stenosis.  6. The inferior vena cava is normal in size with greater than 50% respiratory variability, suggesting right atrial pressure of 3 mmHg. FINDINGS  Left Ventricle: Left ventricular ejection fraction, by estimation, is 55 to 60%. The left ventricle has normal function. The left ventricle has no regional wall motion abnormalities. The left ventricular internal cavity size was normal in size. There is  no left ventricular hypertrophy. Left ventricular diastolic parameters were normal. Right Ventricle: The right ventricular size is normal. No increase  in right ventricular wall thickness. Right ventricular systolic function is normal. Left Atrium: Left atrial size was normal in size. Right Atrium: Right atrial size was normal in size. Pericardium: There is no evidence of pericardial effusion. Mitral Valve: The mitral valve is normal in structure. Mild to moderate mitral valve regurgitation. No evidence of mitral valve stenosis. MV peak gradient, 3.8 mmHg. The mean mitral valve gradient is 2.0 mmHg. Tricuspid Valve: The tricuspid valve is normal in structure. Tricuspid valve regurgitation is mild to moderate. Mild tricuspid stenosis. Aortic Valve: The aortic valve is normal in structure. Aortic valve regurgitation is not visualized. Mild to moderate aortic valve sclerosis/calcification is present, without any evidence of aortic stenosis. Aortic valve mean gradient measures 5.0 mmHg. Aortic valve peak gradient measures 8.6 mmHg. Aortic valve area, by VTI measures 4.23 cm. Pulmonic Valve: The pulmonic valve was normal in structure. Pulmonic valve regurgitation is not visualized. No evidence of pulmonic stenosis. Aorta: The aortic root is normal in size and structure. Venous: The inferior vena cava is normal in  size with greater than 50% respiratory variability, suggesting right atrial pressure of 3 mmHg. IAS/Shunts: No atrial level shunt detected by color flow Doppler.  LEFT VENTRICLE PLAX 2D LVIDd:         5.60 cm  Diastology LVIDs:         4.00 cm  LV e' medial:    3.81 cm/s LV PW:         1.60 cm  LV E/e' medial:  26.8 LV IVS:        1.30 cm  LV e' lateral:   6.64 cm/s LVOT diam:     2.50 cm  LV E/e' lateral: 15.4 LV SV:         120 LV SV Index:   65 LVOT Area:     4.91 cm  RIGHT VENTRICLE RV Basal diam:  3.80 cm LEFT ATRIUM             Index       RIGHT ATRIUM           Index LA diam:        4.70 cm 2.54 cm/m  RA Area:     24.00 cm LA Vol (A2C):   68.3 ml 36.91 ml/m RA Volume:   76.20 ml  41.18 ml/m LA Vol (A4C):   64.5 ml 34.86 ml/m LA Biplane Vol: 68.1 ml 36.80 ml/m  AORTIC VALVE                   PULMONIC VALVE AV Area (Vmax):    3.81 cm    PV Vmax:       1.00 m/s AV Area (Vmean):   3.65 cm    PV Vmean:      62.100 cm/s AV Area (VTI):     4.23 cm    PV VTI:        0.178 m AV Vmax:           147.00 cm/s PV Peak grad:  4.0 mmHg AV Vmean:          98.600 cm/s PV Mean grad:  2.0 mmHg AV VTI:            0.283 m AV Peak Grad:      8.6 mmHg AV Mean Grad:      5.0 mmHg LVOT Vmax:         114.00 cm/s LVOT Vmean:        73.400 cm/s LVOT  VTI:          0.244 m LVOT/AV VTI ratio: 0.86  AORTA Ao Root diam: 3.60 cm MITRAL VALVE                 TRICUSPID VALVE MV Area (PHT): 4.15 cm      TR Peak grad:   34.3 mmHg MV Area VTI:   3.27 cm      TR Vmax:        293.00 cm/s MV Peak grad:  3.8 mmHg MV Mean grad:  2.0 mmHg      SHUNTS MV Vmax:       0.97 m/s      Systemic VTI:  0.24 m MV Vmean:      69.3 cm/s     Systemic Diam: 2.50 cm MV Decel Time: 183 msec MR Peak grad:    139.7 mmHg MR Mean grad:    81.0 mmHg MR Vmax:         591.00 cm/s MR Vmean:        420.0 cm/s MR PISA:         1.57 cm MR PISA Eff ROA: 8 mm MR PISA Radius:  0.50 cm MV E velocity: 102.00 cm/s MV A velocity: 108.00 cm/s MV E/A ratio:  0.94 Isaias Cowman MD Electronically signed by Isaias Cowman MD Signature Date/Time: 03/21/2021/11:50:59 AM    Final     Assessment/Plan: Day of Surgery   Active Problems:   Tibia/fibula fracture, left, closed, with delayed healing, subsequent encounter   Patient will undergo intramedullary fixation for his left tibial shaft fracture today.  Patient has been n.p.o.   Thornton Park , MD 03/21/2021, 1:37 PM

## 2021-03-21 NOTE — Progress Notes (Signed)
Inpatient Diabetes Program Recommendations  AACE/ADA: New Consensus Statement on Inpatient Glycemic Control (2015)  Target Ranges:  Prepandial:   less than 140 mg/dL      Peak postprandial:   less than 180 mg/dL (1-2 hours)      Critically ill patients:  140 - 180 mg/dL   Results for CAELEB, BATALLA (MRN [id number]) as of 03/21/2021 13:31  Ref. Range 03/21/2021 08:54 03/21/2021 12:33 03/21/2021 13:08  Glucose-Capillary Latest Ref Range: 70 - 99 mg/dL 194 (H)  3 units NOVOLOG  49 (L) 65 (L)     Home DM Meds: Lantus 9-10 units Daily  Current Orders: Novolog Moderate Correction Scale/ SSI (0-15 units) Q4 hours     MD- Note patient received 3 units Novolog SSI this AM--Dropped to 49 by 12:30pm  Given pt has ESRD, may consider reducing the Novolog SSI to the 0-9 units (Sensitive) scale Q4 hours    --Will follow patient during hospitalization--  Wyn Quaker RN, MSN, CDE Diabetes Coordinator Inpatient Glycemic Control Team Team Pager: [phone] (8a-5p)

## 2021-03-21 NOTE — Progress Notes (Signed)
Initial Nutrition Assessment  DOCUMENTATION CODES:   Not applicable  INTERVENTION:   Once diet advanced will order Nepro supplements    NUTRITION DIAGNOSIS:   Increased nutrient needs related to post-op healing as evidenced by estimated needs.  GOAL:   Patient will meet greater than or equal to 90% of their needs  MONITOR:   PO intake,Supplement acceptance  REASON FOR ASSESSMENT:   Consult Assessment of nutrition requirement/status,Hip fracture protocol  ASSESSMENT:   Pt with PMH of DM, ESRD on HD MWF, CAD, CHF, blindness, CVA admitted after fall with L tibia-fibula fx.    Plan for OR 4/1 EDW: 72 kg  Spoke with pt who reports that son lives with him. He tends to miss breakfast due to HD, lunch is out with son after HD, then dinner at home. Pt eats protein bars at HD, states he has broken a few teeth on them, no trouble chewing. Reports good appetite and no recent weight changes. Since COVID pt does not go out much and gets less activity than before. He sometimes uses a walker to get around but will sometimes just hold onto walls in his house.  Pt willing to try nepro.   Medications reviewed and include: colace, SSI,  Labs reviewed: BUN: 52, Cr: 6.13 CBG: 194    NUTRITION - FOCUSED PHYSICAL EXAM:  Flowsheet Row Most Recent Value  Orbital Region No depletion  Upper Arm Region Moderate depletion  Thoracic and Lumbar Region No depletion  Buccal Region No depletion  Temple Region No depletion  Clavicle Bone Region Mild depletion  Clavicle and Acromion Bone Region Mild depletion  Scapular Bone Region No depletion  Dorsal Hand Moderate depletion  Patellar Region Severe depletion  Anterior Thigh Region Severe depletion  Posterior Calf Region Unable to assess  Edema (RD Assessment) None  Hair Reviewed  Eyes Reviewed  Mouth Unable to assess  Skin Reviewed  Nails Reviewed       Diet Order:   Diet Order            Diet NPO time specified  Diet effective  midnight                 EDUCATION NEEDS:   No education needs have been identified at this time  Skin:  Skin Assessment: Reviewed RN Assessment  Last BM:  3/31  Height:   Ht Readings from Last 1 Encounters:  03/20/21 5\' 8"  (1.727 m)    Weight:   Wt Readings from Last 1 Encounters:  03/16/21 71.8 kg    Ideal Body Weight:  70 kg  BMI:  Body mass index is 24.07 kg/m.  Estimated Nutritional Needs:   Kcal:  1900-2100  Protein:  90-105 grams  Fluid:  1.2 L  Eufemia Prindle P., RD, LDN, CNSC See AMiON for contact information

## 2021-03-21 NOTE — Progress Notes (Signed)
*  PRELIMINARY RESULTS* Echocardiogram 2D Echocardiogram has been performed.  Richard Zavala 03/21/2021, 11:03 AM

## 2021-03-21 NOTE — Anesthesia Procedure Notes (Signed)
Procedure Name: Intubation Date/Time: 03/21/2021 2:03 PM Performed by: Johnna Acosta, CRNA Pre-anesthesia Checklist: Patient identified, Patient being monitored, Timeout performed, Emergency Drugs available and Suction available Patient Re-evaluated:Patient Re-evaluated prior to induction Oxygen Delivery Method: Circle system utilized Preoxygenation: Pre-oxygenation with 100% oxygen Induction Type: IV induction Ventilation: Mask ventilation without difficulty Laryngoscope Size: Mac and 4 Grade View: Grade I Tube type: Oral Tube size: 7.0 mm Number of attempts: 1 Airway Equipment and Method: Stylet Placement Confirmation: ETT inserted through vocal cords under direct vision,  positive ETCO2 and breath sounds checked- equal and bilateral Secured at: 22 cm Tube secured with: Tape Dental Injury: Teeth and Oropharynx as per pre-operative assessment

## 2021-03-21 NOTE — Progress Notes (Deleted)
PHARMACIST - PHYSICIAN COMMUNICATION  CONCERNING:  Enoxaparin (Lovenox) for DVT Prophylaxis    RECOMMENDATION: Patient was prescribed enoxaprin 40mg  q24 hours for VTE prophylaxis.   There were no vitals filed for this visit.  Body mass index is 24.07 kg/m.  Estimated Creatinine Clearance: 12.7 mL/min (A) (by C-G formula based on SCr of 6.13 mg/dL (H)).   Patient is candidate for enoxaparin 30mg  every 24 hours based on CrCl <64ml/min or Weight <45kg  DESCRIPTION: Pharmacy has adjusted enoxaparin dose per [facility] policy.  Patient is now receiving enoxaparin 30 mg every 24 hours    Sherilyn Banker, PharmD Pharmacy Resident  03/21/2021 5:42 PM

## 2021-03-21 NOTE — Op Note (Signed)
DATE OF SURGERY:  03/21/2021  TIME: 4:32 PM  PATIENT NAME:  Richard Zavala  AGE: 60 y.o.  PRE-OPERATIVE DIAGNOSIS: Displaced left closed spiral tibial shaft fracture  POST-OPERATIVE DIAGNOSIS:  SAME  PROCEDURE:  INTRAMEDULLARY FIXATION OF LEFT SPIRAL TIBIAL SHAFT FRACTURE  SURGEON:  Thornton Park  OPERATIVE IMPLANTS: Biomet Versanail 10 x 33 cm nail with two proximal interlocking screws and two distal interlocking screw  PREOPERATIVE INDICATIONS:  [name] is a 60 y.o. year old who fell and suffered a tibia fracture on 03/16/21.  Initially his fracture was nondisplaced and he was treated nonoperatively with splinting.  Patient returned to the office yesterday with a displaced fracture.   He was sent to the [facility] and admitted to orthopaedics for management of the displaced right tibial shaft fracture.    The risks, benefits and alternatives were discussed with the patient and their family.  The risks include but are not limited to infection, bleeding, nerve or blood vessel injury, malunion, nonunion, hardware prominence, hardware failure, change in leg lengths or lower extremity rotation need for further surgery. Medical risks include but are not limited to DVT and pulmonary embolism, myocardial infarction, stroke, pneumonia, respiratory failure and death. The patient and their family understood these risks and wished to proceed with surgery.  OPERATIVE PROCEDURE:  The patient was brought to the operating room and placed in the supine position on the radiolucent OR table. General anesthesia was administered. The patient was prepped and draped in a sterile fashion.  A closed reduction was performed using a tibial distractor under C-arm guidance.  The fracture reduction was confirmed on both AP and lateral views. A time out was performed to verify the patient's name, date of birth, medical record number, correct site of surgery correct procedure to be performed. It was also  used to verify the patient received antibiotics and that appropriate instruments, implants and radiographic studies were available in the room. Once all in attendance were in agreement, the case began.   A mid line incision was made extending from the tibial tubercle to the inferior pole of the patella. A threaded guidepin was then inserted into the proximal tibia with a drill. A starting awl was then placed over this and drill pin and an entry hole was made in the proximal tibia. The threaded guidepin was removed and replaced with a ball tip guidewire which was advanced down the tibial shaft and across the fracture site into the distal tibia. Position of this ball tip guidewire was confirmed on AP and lateral C-arm images. Sequential reamers were then used over the ball-tipped guidewire until sufficient cortical chatter was encountered with an 11.5 mm reamer. A 10 mm nail was selected. The depth of the guidewire was measured to determine the appropriate length of the nail measured to 33 cm. The nail was then assembled onto the inserting jig and advanced into position within the tibial shaft. Its final position was confirmed on AP and lateral C-arm images both proximally and distally.   Once the nail was completely seated, the drill guide for the lag screw was placed through the guide arm for the Versanail. A drill bit was then placed through this drill guide and advanced bicortically from a lateral to medial direction.   The length of the cortical diameter was measured and the corresponding screw was advanced into position by hand. Its final position was confirmed on AP and lateral C-arm images.  A second oblique screw was placed using  the above-noted technique from an anterior medial to posterior lateral position.  Once both proximal interlocking screws were placed the attention was turned to the distal interlocking screws.  Two distal interlocking screws were then placed using a perfect circle technique.  Once the position of the first distal interlocking screw hole was identified with C-arm, a small stab incision was made to allow the drill to approximate the medial cortex of the tibia. The drill for the interlocking screw was then advanced bicortically. The diameter of the tibia was measured through this drill hole using a depth gauge. An interlocking screw with the corresponding length was then inserted by hand.  The technique was repeated for the second distal interlocking screw.  Once both interlocking screws were in position, final C-arm images of the intramedullary construct were taken in both the AP and lateral planes including the proximal and distal ends of the tibia as well as the fracture site. The insertion arm for the tibial nail was then removed.  The wounds were irrigated copiously and closed with 0 Vicryl for closure of the deep fascia and 2-0 Vicryl for his subcutaneous closure. The skin over the anterior proximal midline knee incision was approximated with staples.  The stab incision for placement of the distal and proximal interlocking screws was closed with 4-0 nylon.  A dry sterile dressing was applied. An AO splint was also applied to the operative leg.  I was scrubbed and present the entire case and all sharp, sponge and instrument counts were correct at the conclusion of the case. Patient was transferred to hospital bed and brought to PACU in stable condition. I spoke with the patient's son by phone from the PACU to let him know the case had gone without complication and the patient was stable in the recovery room.    Timoteo Gaul, MD

## 2021-03-21 NOTE — Transfer of Care (Signed)
Immediate Anesthesia Transfer of Care Note  Patient: Richard Zavala  Procedure(s) Performed: INTRAMEDULLARY (IM) NAIL TIBIAL (Left )  Patient Location: PACU  Anesthesia Type:General  Level of Consciousness: drowsy  Airway & Oxygen Therapy: Patient Spontanous Breathing and Patient connected to face mask oxygen  Post-op Assessment: Report given to RN and Post -op Vital signs reviewed and stable  Post vital signs: Reviewed and stable  Last Vitals:  Vitals Value Taken Time  BP 118/56 03/21/21 1622  Temp 36.5 C 03/21/21 1622  Pulse 55 03/21/21 1629  Resp 19 03/21/21 1629  SpO2 96 % 03/21/21 1629  Vitals shown include unvalidated device data.  Last Pain:  Vitals:   03/21/21 1230  TempSrc: Temporal  PainSc: 0-No pain         Complications: No complications documented.

## 2021-03-21 NOTE — Consult Note (Signed)
[facility] Card[facility]  CARDIOLOGY CONSULT NOTE  Patient ID: Richard Zavala MRN: [id number] DOB/AGE: 1961/11/25 60 y.o.  Admit date: 03/20/2021 Referring Physician Posey Pronto Primary Physician [facility] Primary Cardiologist [facility] Reason for Consultation preoperative cardiovascular assessment  HPI: 60 year old gentleman referred for preoperative cardiovascular assessment.  Patient is awaiting surgery for left tibia/fibula fracture.  The patient has a history of end-stage renal disease on chronic hemodialysis.  The patient denies exertional chest pain or shortness of breath.  He denies palpitations or heart racing.  He denies prior history of myocardial infarction.  ECG revealed sinus rhythm at 59 bpm with left ventricular pressure hypertrophy.  High-sensitivity troponin was elevated to 222, 186, 186.  2D echocardiogram performed earlier today revealed normal left ventricular function, with LVEF 55 to 60%.  Review of systems complete and found to be negative unless listed above     Past Medical History:  Diagnosis Date  . Anemia   . Blind   . BPH (benign prostatic hyperplasia)   . CAD (coronary artery disease)   . CHF (congestive heart failure) ([place])   . Chronic kidney disease (CKD), stage IV (severe) (HCC)    DIALYSIS  . Diabetes mellitus without complication ([place])   . ESRD (end stage renal disease) ([place])    Monday-Wednesday-Friday dialysis  . Hyperlipemia   . Hypertension   . Neuropathy   . Osteoporosis   . Pulmonary HTN ([place])   . Stroke [facility])    2013  . TIA (transient ischemic attack)   . TRD (traction retinal detachment)   . Wilms' tumor [facility])     Past Surgical History:  Procedure Laterality Date  . A/V FISTULAGRAM Left 01/28/2017   Procedure: A/V Fistulagram;  Surgeon: Algernon Huxley, MD;  Location: [place] CV [facility];  Service: Cardiovascular;  Laterality: Left;  . A/V FISTULAGRAM Left 04/28/2018   Procedure: A/V FISTULAGRAM;  Surgeon: Algernon Huxley, MD;  Location: [place] CV [facility];   Service: Cardiovascular;  Laterality: Left;  . A/V FISTULAGRAM N/A 03/21/2020   Procedure: A/V FISTULAGRAM;  Surgeon: Algernon Huxley, MD;  Location: [place] CV [facility];  Service: Cardiovascular;  Laterality: N/A;  . AV FISTULA PLACEMENT    . AV FISTULA PLACEMENT Right 11/23/2019   Procedure: ARTERIOVENOUS (AV) FISTULA CREATION (RADIOCEPHALIC );  Surgeon: Algernon Huxley, MD;  Location: [facility] ORS;  Service: Vascular;  Laterality: Right;  . CARDIAC CATHETERIZATION    . CATARACT EXTRACTION W/PHACO Right 05/14/2016   Procedure: CATARACT EXTRACTION PHACO AND INTRAOCULAR LENS PLACEMENT (IOC);  Surgeon: Eulogio Bear, MD;  Location: [facility] ORS;  Service: Ophthalmology;  Laterality: Right;  [place] 1.46 AP% 11.5 CDE 12.33 FLUID PACK LOT # [id number]  . EYE SURGERY    . FRACTURE SURGERY     RIGHT LEG WITH ROD  . HIP SURGERY    . NEPHRECTOMY RADICAL    . PERIPHERAL VASCULAR CATHETERIZATION N/A 08/28/2015   Procedure: A/V Shuntogram/Fistulagram;  Surgeon: Algernon Huxley, MD;  Location: [place] CV LAB;  Service: Cardiovascular;  Laterality: N/A;  . PERIPHERAL VASCULAR CATHETERIZATION Left 08/28/2015   Procedure: A/V Shunt Intervention;  Surgeon: Algernon Huxley, MD;  Location: [place] [facility];  Service: Cardiovascular;  Laterality: Left;  . UPPER EXTREMITY ANGIOGRAPHY Right 08/01/2020   Procedure: UPPER EXTREMITY ANGIOGRAPHY;  Surgeon: Algernon Huxley, MD;  Location: [place] [facility];  Service: Cardiovascular;  Laterality: Right;    Medications Prior to Admission  Medication Sig Dispense Refill Last Dose  . albuterol (VENTOLIN HFA)  108 (90 Base) MCG/ACT inhaler Inhale 2 puffs into the lungs every 6 (six) hours as needed.   03/19/2021 at Unknown time  . amLODipine (NORVASC) 10 MG tablet Hold until followup with outpatient doctor due to intermittent low blood pressure. 30 tablet 0 03/20/2021 at 1200  . atorvastatin (LIPITOR) 80 MG tablet Take 1 tablet (80 mg total) by mouth at bedtime.   03/19/2021 at PM  .  carvedilol (COREG) 12.5 MG tablet Take 1 tablet (12.5 mg total) by mouth 2 (two) times daily.   03/20/2021 at Unknown time  . epoetin alfa (EPOGEN) 10000 UNIT/ML injection Inject 0.8 mLs (8,000 Units total) into the vein Every Tuesday,Thursday,and Saturday with dialysis. 1 mL  Past Week at Unknown time  . furosemide (LASIX) 40 MG tablet Hold until followup with outpatient doctor due to intermittent low blood pressure. 30 tablet  Past Week at Unknown time  . insulin glargine (LANTUS) 100 UNIT/ML injection Inject 9-10 Units into the skin daily.    Past Week at Unknown time  . lidocaine (LIDODERM) 5 % Place 1 patch onto the skin daily.   03/19/2021 at Unknown time  . pantoprazole (PROTONIX) 40 MG tablet Take 40 mg by mouth 2 (two) times daily before a meal.   03/19/2021 at Unknown time  . HYDROcodone-acetaminophen (NORCO/VICODIN) 5-325 MG tablet Take 2 tablets by mouth every 6 (six) hours as needed for moderate pain or severe pain. (Patient not taking: Reported on 03/20/2021) 16 tablet 0 Not Taking at Unknown time  . isosorbide mononitrate (IMDUR) 30 MG 24 hr tablet Hold until followup with outpatient doctor due to intermittent low blood pressure. (Patient not taking: Reported on 03/20/2021) 30 tablet 0 Not Taking at Unknown time   Social History   Socioeconomic History  . Marital status: Divorced    Spouse name: Not on file  . Number of children: Not on file  . Years of education: Not on file  . Highest education level: Not on file  Occupational History  . Not on file  Tobacco Use  . Smoking status: Former Smoker    Packs/day: 1.00    Types: Cigarettes    Quit date: 06/26/2012    Years since quitting: 8.7  . Smokeless tobacco: Never Used  Vaping Use  . Vaping Use: Never used  Substance and Sexual Activity  . Alcohol use: No  . Drug use: No  . Sexual activity: Never  Other Topics Concern  . Not on file  Social History Narrative   Lives at home with his son. Ambulates well at baseline.    Social Determinants of Health   Financial Resource Strain: Not on file  Food Insecurity: Not on file  Transportation Needs: Not on file  Physical Activity: Not on file  Stress: Not on file  Social Connections: Not on file  Intimate Partner Violence: Not on file    Family History  Problem Relation Age of Onset  . CAD Father   . COPD Father   . CAD Paternal Grandfather   . Diabetes Maternal Aunt   . Diabetes Maternal Uncle       Review of systems complete and found to be negative unless listed above      PHYSICAL EXAM  General: Well developed, well nourished, in no acute distress HEENT:  Normocephalic and atramatic Neck:  No JVD.  Lungs: Clear bilaterally to auscultation and percussion. Heart: HRRR . Normal S1 and S2 without gallops or murmurs.  Abdomen: Bowel sounds are positive, abdomen soft and  non-tender  Msk:  Back normal, normal gait. Normal strength and tone for age. Extremities: No clubbing, cyanosis or edema.   Neuro: Alert and oriented X 3. Psych:  Good affect, responds appropriately  Labs:   Lab Results  Component Value Date   WBC 5.0 03/21/2021   HGB 8.9 (L) 03/21/2021   HCT 27.8 (L) 03/21/2021   MCV 99.6 03/21/2021   PLT 151 03/21/2021    Recent Labs  Lab 03/20/21 1818 03/21/21 0432  NA 137 136  K 3.8 4.0  CL 102 103  CO2 22 22  BUN 47* 52*  CREATININE 5.71* 6.13*  CALCIUM 8.0* 7.9*  PROT 6.0*  --   BILITOT 1.1  --   ALKPHOS 115  --   ALT 19  --   AST 19  --   GLUCOSE 222* 269*   Lab Results  Component Value Date   TROPONINI 0.03 (HH) 08/21/2016    Lab Results  Component Value Date   CHOL 115 08/21/2016   Lab Results  Component Value Date   HDL 41 08/21/2016   Lab Results  Component Value Date   LDLCALC 54 08/21/2016   Lab Results  Component Value Date   TRIG 102 08/21/2016   Lab Results  Component Value Date   CHOLHDL 2.8 08/21/2016   No results found for: LDLDIRECT    Radiology: DG Chest 1 View  Result  Date: 03/20/2021 CLINICAL DATA:  Chest pain leg fracture EXAM: CHEST  1 VIEW COMPARISON:  02/12/2021 FINDINGS: Cardiomegaly with vascular congestion and mild interstitial edema. No consolidation or pneumothorax IMPRESSION: Cardiomegaly with vascular congestion and mild interstitial edema. Electronically Signed   By: Donavan Foil M.D.   On: 03/20/2021 19:43   DG Tibia/Fibula Left  Result Date: 03/20/2021 CLINICAL DATA:  Fall with fracture on Sunday night.  Re-injured. EXAM: LEFT TIBIA AND FIBULA - 2 VIEW COMPARISON:  Radiograph 03/16/2021 FINDINGS: Displaced spiral fracture of the distal tibial shaft has increased displacement from prior exam. No evidence of intra-articular extension, no ankle joint effusion. There is both a healed remote is well as acute fracture of the fibular neck. Acute component was not seen on prior. There is posterior splint material in place. Bones diffusely under mineralized. IMPRESSION: 1. Displaced spiral fracture of the distal tibial shaft, increased displacement from prior exam. 2. New acute fracture of the proximal fibular neck. This was not seen on prior exam, there is a chronic healed proximal fibular fracture. Electronically Signed   By: Keith Rake M.D.   On: 03/20/2021 19:26   DG Tibia/Fibula Left  Result Date: 03/16/2021 CLINICAL DATA:  Left lower leg injury. Slipped getting into bed today. EXAM: LEFT TIBIA AND FIBULA - 2 VIEW COMPARISON:  None. FINDINGS: Mildly displaced spiral fracture of the distal tibial metadiaphysis. Lateral displacement of 1-2 mm of the distal fracture fragment. No intra-articular component is visualized. Proximal fibular shaft fracture is remote. No acute fibular fracture. Knee and ankle alignment are maintained. The bones are diffusely under mineralized. There is soft tissue edema at the fracture site anteriorly, and to a lesser extent diffusely throughout the lower leg. IMPRESSION: 1. Mildly displaced spiral fracture of the distal tibial  metadiaphysis. No intra-articular component visualized by radiograph. 2. Proximal fibular shaft fracture is healed/remote. Electronically Signed   By: Keith Rake M.D.   On: 03/16/2021 23:29   US RENAL  Result Date: 03/04/2021 CLINICAL DATA:  Distended urinary bladder EXAM: RENAL / URINARY TRACT ULTRASOUND COMPLETE COMPARISON:  CT 02/06/2021  FINDINGS: Right Kidney: Renal measurements: 10.4 x 4.8 x 5.3 cm = volume: 137 mL. Cortex is echogenic. Mild diffuse cortical thinning. No mass or hydronephrosis. Shadowing calcifications within the central right kidney either due to stones or calcified vessels as seen on CT. Left Kidney: Surgically absent Bladder: Decompressed. Other: Calcifications in the spleen which may be vascular. Small left pleural effusion IMPRESSION: 1. Status post left nephrectomy. 2. Echogenic right kidney with mild cortical thinning consistent with medical renal disease. No hydronephrosis. Intrarenal shadowing calcifications, favored to represent intravascular calcifications over stones given findings on CT from February. 3. Small left pleural effusion Electronically Signed   By: Donavan Foil M.D.   On: 03/04/2021 17:56    EKG: Sinus rhythm at 59 bpm with LVH  ASSESSMENT AND PLAN:   1.  Preoperative cardiovascular assessment.  The patient currently denies chest pain.  He has no prior history of myocardial infarction.  2D echocardiogram reveals normal left ventricular function.  ECG revealed sinus rhythm without acute ischemic ST-T wave changes.  High-sensitivity troponin borderline elevated and flat without delta.  The patient should be at septa bowl risk for surgery for left tibia/fibular fracture. 2.  Mildly elevated high-sensitivity troponin (222, 186, 186 ), without delta, in the absence of chest pain, normal left ventricular function by 2D echocardiogram, likely due to myocardial injury ( I5A ) in the absence of ischemia in patient with ESRD on chronic hemodialysis 3.  Left  tibia/fibula fracture  Recommendations  1.  Agree with current therapy 2.  Proceed with surgery as planned 3.  Defer further cardiac diagnostics 4.  Continue carvedilol pre-, peri and postoperatively  Signed: Isaias Cowman MD,PhD, [facility] 03/21/2021, 11:38 AM

## 2021-03-21 NOTE — Progress Notes (Signed)
[place], [place] 03/21/21  Subjective:   LOS: 1  Richard Zavala is a 60 y.o. male and established patient of [facility]. He has a past medical history of ESRD on hemodialysis, anemia, diabetes, CAD, CHF, pulmonary hypertension and osteoporosis. He presents to the ED with Tibia/fibula fracture and will undergo surgery later today.  Patient is seen today laying in bed Alert and oriented Currently NPO for procedure Denies shortness of breath or chest pains Complains of leg pain during activity  Last HD was 03/19/21  Objective:  Vital signs in last 24 hours:  Temp:  [97.8 F (36.6 C)-98.6 F (37 C)] 98.4 F (36.9 C) (04/01 1131) Pulse Rate:  [62-64] 62 (04/01 1131) Resp:  [18-20] 19 (04/01 1131) BP: (148-171)/(53-75) 167/64 (04/01 1131) SpO2:  [91 %-95 %] 93 % (04/01 1131)  Weight change:  There were no vitals filed for this visit.  Intake/Output:    Intake/Output Summary (Last 24 hours) at 03/21/2021 1213 Last data filed at 03/21/2021 4174 Gross per 24 hour  Intake --  Output 0 ml  Net 0 ml     Physical Exam: General: NAD, laying in bed  HEENT Normocephalic, moist oral mucosa  Pulm/lungs Clear sounds, regular breathing pattern  CVS/Heart S1S2 present, regular  Abdomen:  Soft, non tender, nondistended  Extremities: Min edema, left LE ace wrapped  Neurologic: Alert, oriented, able to answer questions  Skin: No rashes or masses  Access: Rt lower AVF       Basic Metabolic Panel:  Recent Labs  Lab 03/20/21 1818 03/21/21 0432  NA 137 136  K 3.8 4.0  CL 102 103  CO2 22 22  GLUCOSE 222* 269*  BUN 47* 52*  CREATININE 5.71* 6.13*  CALCIUM 8.0* 7.9*     CBC: Recent Labs  Lab 03/20/21 1818 03/21/21 0432  WBC 6.2 5.0  NEUTROABS 4.3  --   HGB 8.4* 8.9*  HCT 26.6* 27.8*  MCV 99.3 99.6  PLT 146* 151      Lab Results  Component Value Date   HEPBSAG NON REACTIVE 02/07/2021   HEPBSAB Non Reactive 08/14/2015       Microbiology:  Recent Results (from the past 240 hour(s))  Resp Panel by RT-PCR (Flu A&B, Covid) Nasopharyngeal Swab     Status: None   Collection Time: 03/20/21  6:17 PM   Specimen: Nasopharyngeal Swab; Nasopharyngeal(NP) swabs in vial transport medium  Result Value Ref Range Status   SARS Coronavirus 2 by RT PCR NEGATIVE NEGATIVE Final    Comment: (NOTE) SARS-CoV-2 target nucleic acids are NOT DETECTED.  The SARS-CoV-2 RNA is generally detectable in upper respiratory specimens during the acute phase of infection. The lowest concentration of SARS-CoV-2 viral copies this assay can detect is 138 copies/mL. A negative result does not preclude SARS-Cov-2 infection and should not be used as the sole basis for treatment or other patient management decisions. A negative result may occur with  improper specimen collection/handling, submission of specimen other than nasopharyngeal swab, presence of viral mutation(s) within the areas targeted by this assay, and inadequate number of viral copies(<138 copies/mL). A negative result must be combined with clinical observations, patient history, and epidemiological information. The expected result is Negative.  Fact Sheet for Patients:  EntrepreneurPulse.com.au  Fact Sheet for Healthcare Providers:  IncredibleEmployment.be  This test is no t yet approved or cleared by the [place] FDA and  has been authorized for detection and/or diagnosis of SARS-CoV-2 by FDA under an Emergency Use Authorization (  EUA). This EUA will remain  in effect (meaning this test can be used) for the duration of the COVID-19 declaration under Section 564(b)(1) of the Act, 21 U.S.C.section 360bbb-3(b)(1), unless the authorization is terminated  or revoked sooner.       Influenza A by PCR NEGATIVE NEGATIVE Final   Influenza B by PCR NEGATIVE NEGATIVE Final    Comment: (NOTE) The Xpert Xpress SARS-CoV-2/FLU/RSV plus  assay is intended as an aid in the diagnosis of influenza from Nasopharyngeal swab specimens and should not be used as a sole basis for treatment. Nasal washings and aspirates are unacceptable for Xpert Xpress SARS-CoV-2/FLU/RSV testing.  Fact Sheet for Patients: EntrepreneurPulse.com.au  Fact Sheet for Healthcare Providers: IncredibleEmployment.be  This test is not yet approved or cleared by the [place] FDA and has been authorized for detection and/or diagnosis of SARS-CoV-2 by FDA under an Emergency Use Authorization (EUA). This EUA will remain in effect (meaning this test can be used) for the duration of the COVID-19 declaration under Section 564(b)(1) of the Act, 21 U.S.C. section 360bbb-3(b)(1), unless the authorization is terminated or revoked.  Performed at [facility], [address] [place], [place] [address]     Coagulation Studies: Recent Labs    03/20/21 1818  LABPROT 16.4*  INR 1.4*    Urinalysis: No results for input(s): COLORURINE, LABSPEC, PHURINE, GLUCOSEU, HGBUR, BILIRUBINUR, KETONESUR, PROTEINUR, UROBILINOGEN, NITRITE, LEUKOCYTESUR in the last 72 hours.  Invalid input(s): APPERANCEUR    Imaging: DG Chest 1 View  Result Date: 03/20/2021 CLINICAL DATA:  Chest pain leg fracture EXAM: CHEST  1 VIEW COMPARISON:  02/12/2021 FINDINGS: Cardiomegaly with vascular congestion and mild interstitial edema. No consolidation or pneumothorax IMPRESSION: Cardiomegaly with vascular congestion and mild interstitial edema. Electronically Signed   By: Donavan Foil M.D.   On: 03/20/2021 19:43   DG Tibia/Fibula Left  Result Date: 03/20/2021 CLINICAL DATA:  Fall with fracture on Sunday night.  Re-injured. EXAM: LEFT TIBIA AND FIBULA - 2 VIEW COMPARISON:  Radiograph 03/16/2021 FINDINGS: Displaced spiral fracture of the distal tibial shaft has increased displacement from prior exam. No evidence of intra-articular extension,  no ankle joint effusion. There is both a healed remote is well as acute fracture of the fibular neck. Acute component was not seen on prior. There is posterior splint material in place. Bones diffusely under mineralized. IMPRESSION: 1. Displaced spiral fracture of the distal tibial shaft, increased displacement from prior exam. 2. New acute fracture of the proximal fibular neck. This was not seen on prior exam, there is a chronic healed proximal fibular fracture. Electronically Signed   By: Melanie  Sanford M.D.   On: 03/20/2021 19:26   ECHOCARDIOGRAM COMPLETE  Result Date: 03/21/2021    ECHOCARDIOGRAM REPORT   Patient Name:   Richard Zavala Date of Exam: 03/21/2021 Medical Rec #:  [id number]       Height:       68.0 in Accession #:    [id number]      Weight:       158.3 lb Date of Birth:  04/19/1961       BSA:          1.850 m Patient Age:    60 years        BP:           15 8/53 mmHg Patient Gender: M               HR:  62 bpm. Exam Location:  [facility] Procedure: 2D Echo, Color Doppler, Cardiac Doppler and Strain Analysis Indications:     Y30.16 CHF-Acute Systolic  History:         Patient has prior history of Echocardiogram examinations. CHF,                  CAD, TIA and CKD; Risk Factors:Hypertension and Dyslipidemia.  Sonographer:     Charmayne Sheer RDCS (AE) Referring Phys:  [place] Diagnosing Phys: Isaias Cowman MD IMPRESSIONS  1. Left ventricular ejection fraction, by estimation, is 55 to 60%. The left ventricle has normal function. The left ventricle has no regional wall motion abnormalities. Left ventricular diastolic parameters were normal.  2. Right ventricular systolic function is normal. The right ventricular size is normal.  3. The mitral valve is normal in structure. Mild to moderate mitral valve regurgitation. No evidence of mitral stenosis.  4. Tricuspid valve regurgitation is mild to moderate. Mild tricuspid stenosis.  5. The aortic valve is normal in structure. Aortic valve  regurgitation is not visualized. Mild to moderate aortic valve sclerosis/calcification is present, without any evidence of aortic stenosis.  6. The inferior vena cava is normal in size with greater than 50% respiratory variability, suggesting right atrial pressure of 3 mmHg. FINDINGS  Left Ventricle: Left ventricular ejection fraction, by estimation, is 55 to 60%. The left ventricle has normal function. The left ventricle has no regional wall motion abnormalities. The left ventricular internal cavity size was normal in size. There is  no left ventricular hypertrophy. Left ventricular diastolic parameters were normal. Right Ventricle: The right ventricular size is normal. No increase in right ventricular wall thickness. Right ventricular systolic function is normal. Left Atrium: Left atrial size was normal in size. Right Atrium: Right atrial size was normal in size. Pericardium: There is no evidence of pericardial effusion. Mitral Valve: The mitral valve is normal in structure. Mild to moderate mitral valve regurgitation. No evidence of mitral valve stenosis. MV peak gradient, 3.8 mmHg. The mean mitral valve gradient is 2.0 mmHg. Tricuspid Valve: The tricuspid valve is normal in structure. Tricuspid valve regurgitation is mild to moderate. Mild tricuspid stenosis. Aortic Valve: The aortic valve is normal in structure. Aortic valve regurgitation is not visualized. Mild to moderate aortic valve sclerosis/calcification is present, without any evidence of aortic stenosis. Aortic valve mean gradient measures 5.0 mmHg. Aortic valve peak gradient measures 8.6 mmHg. Aortic valve area, by VTI measures 4.23 cm. Pulmonic Valve: The pulmonic valve was normal in structure. Pulmonic valve regurgitation is not visualized. No evidence of pulmonic stenosis. Aorta: The aortic root is normal in size and structure. Venous: The inferior vena cava is normal in size with greater than 50% respiratory variability, suggesting right atrial  pressure of 3 mmHg. IAS/Shunts: No atrial level shunt detected by color flow Doppler.  LEFT VENTRICLE PLAX 2D LVIDd:         5.60 cm  Diastology LVIDs:         4.00 cm  LV e' medial:    3.81 cm/s LV PW:         1.60 cm  LV E/e' medial:  26.8 LV IVS:        1.30 cm  LV e' lateral:   6.64 cm/s LVOT diam:     2.50 cm  LV E/e' lateral: 15.4 LV SV:         120 LV SV Index:   65 LVOT Area:     4.91  cm  RIGHT VENTRICLE RV Basal diam:  3.80 cm LEFT ATRIUM             Index       RIGHT ATRIUM           Index LA diam:        4.70 cm 2.54 cm/m  RA Area:     24.00 cm LA Vol (A2C):   68.3 ml 36.91 ml/m RA Volume:   76.20 ml  41.18 ml/m LA Vol (A4C):   64.5 ml 34.86 ml/m LA Biplane Vol: 68.1 ml 36.80 ml/m  AORTIC VALVE                   PULMONIC VALVE AV Area (Vmax):    3.81 cm    PV Vmax:       1.00 m/s AV Area (Vmean):   3.65 cm    PV Vmean:      62.100 cm/s AV Area (VTI):     4.23 cm    PV VTI:        0.178 m AV Vmax:           147.00 cm/s PV Peak grad:  4.0 mmHg AV Vmean:          98.600 cm/s PV Mean grad:  2.0 mmHg AV VTI:            0.283 m AV Peak Grad:      8.6 mmHg AV Mean Grad:      5.0 mmHg LVOT Vmax:         114.00 cm/s LVOT Vmean:        73.400 cm/s LVOT VTI:          0.244 m LVOT/AV VTI ratio: 0.86  AORTA Ao Root diam: 3.60 cm MITRAL VALVE                 TRICUSPID VALVE MV Area (PHT): 4.15 cm      TR Peak grad:   34.3 mmHg MV Area VTI:   3.27 cm      TR Vmax:        293.00 cm/s MV Peak grad:  3.8 mmHg MV Mean grad:  2.0 mmHg      SHUNTS MV Vmax:       0.97 m/s      Systemic VTI:  0.24 m MV Vmean:      69.3 cm/s     Systemic Diam: 2.50 cm MV Decel Time: 183 msec MR Peak grad:    139.7 mmHg MR Mean grad:    81.0 mmHg MR Vmax:         591.00 cm/s MR Vmean:        420.0 cm/s MR PISA:         1.57 cm MR PISA Eff ROA: 8 mm MR PISA Radius:  0.50 cm MV E velocity: 102.00 cm/s MV A velocity: 108.00 cm/s MV E/A ratio:  0.94 Isaias Cowman MD Electronically signed by Isaias Cowman MD Signature  Date/Time: 03/21/2021/11:50:59 AM    Final      Medications:   . sodium chloride 75 mL/hr at 03/20/21 2220  . methocarbamol (ROBAXIN) IV     . amLODipine  10 mg Oral Daily  . atorvastatin  80 mg Oral QHS  . carvedilol  6.25 mg Oral BID  . Chlorhexidine Gluconate Cloth  6 each Topical Q0600  . docusate sodium  100 mg Oral BID  . insulin aspart  0-15 Units Subcutaneous Q4H  .  pantoprazole (PROTONIX) IV  40 mg Intravenous Q12H   albuterol, bisacodyl, hydrALAZINE, HYDROmorphone (DILAUDID) injection, magnesium citrate, methocarbamol **OR** methocarbamol (ROBAXIN) IV, oxyCODONE, senna-docusate  Assessment/ Plan:  60 y.o. male with  was admitted on 03/20/2021 for  Active Problems:   Tibia/fibula fracture, left, closed, with delayed healing, subsequent encounter  ESRD (end stage renal disease) ([place]) [N18.6] Chest pain [R07.9] Tibia/fibula fracture, left, closed, initial encounter [S82.202A, S82.402A] Tibia/fibula fracture, left, closed, with delayed healing, subsequent encounter [S82.202G, S82.402G] Closed fracture of distal end of left tibia, unspecified fracture morphology, initial encounter [S82.302A]  [facility]/MWF/ Rt lower AVF/ 72kg  #. ESRD on HD -scheduled for dialysis today after surgery - UF goal 1L - Next treatment will be Monday  #. Anemia of CKD  Lab Results  Component Value Date   HGB 8.9 (L) 03/21/2021  EPO given outpatient EPO 10000 units with HD  #. Secondary hyperparathyroidism of renal origin N 25.81   No results found for: PTH Lab Results  Component Value Date   PHOS 4.5 02/08/2021  Within acceptable range Monitor calcium and phos level during this admission   #. Diabetes type 2 with CKD Hemoglobin A1C (%)  Date Value  10/28/2012 8.3 (H)   Hgb A1c MFr Bld (%)  Date Value  02/08/2021 5.8 (H)  Monitoring    LOS: [place] 4/1/202212:13 PM  [facility] [place], [place]

## 2021-03-21 NOTE — Progress Notes (Signed)
[facility] Progress Note  Patient: Richard Zavala    ATF:[id number]  DOA: 03/20/2021     Date of Service: the patient was seen and examined on 03/21/2021  Chief Complaint  Patient presents with  . Leg Injury   Brief hospital course: Pt is 60 y/o male seen in ED as consult for left tibia #, that has gotten worse. Pt states he fell on left side from what It sounds like he fell off his bed.  Today pt is alert,awake and reports chest pain since yesterday and no other complaints except for pain.say she followed up with his cardiology February last year.  He has referral to go locally but has not seen md yet. He has not smoked in about 9 years. He does not drink alcohol.Son has been helping him lifting him and moving him at home for ADL's and otherwise because of pain.  In ed pt is A/O x 3 and EKG is negative for any st changes or TWI. Cardiology consulted for elevated troponin.   Assessment and Plan: Active Problems:   Tibia/fibula fracture, left, closed, with delayed healing, subsequent encounter  # Tibia/Fibula fracture: Delayed healing and OR today as per orthopedic plan with spinal anesthesia plan. Pt is moderate anesthesia risk with h/o smoking and suspect underlying COPD. Pt also has heart diease history and c/o left precordial chest pain, currently patient is chest pain-free. Continue as needed medication for pain control Further management as per primary team    # ESRD on HD: Nephrology consult for HD requested with Dr.Kolluru.  # Chest pain: Elevated troponin, trending down Currently patient is chest pain-free last echo in 2019 with EF of 60-65%. Hold antiplatelet, resume aspirin when cleared by orthopedics  cont coreg. BNP of 4500 and troponin of 222  Cardiology consult  EKG: S.Brady 59, Prolonged pR 216 and LVH, q waves on v1. Follow repeat 2D echo carotid   # Htn: On pervious admission pt's BP meds held except for coreg. We will continue coreg and  resume amlodipine as well due to High BP Use IV hydralazine as needed We will continue to monitor BP and titrate medication accordingly  # CAD: Continue atorvastatin, chart review shows patient is not on any antiplatelet,  We will start low-dose aspirin if no contraindications as into GI bleeding, or ICH.  # CHF: Pt's lasix held, we will monitor for weights and I/O's.  Elevated BNP, continue fluid management by hemodialysis   # IDDM T2,  Started Humalog sliding scale Monitor FSBG   # Anemia of c/h kidney: We will monitor CBC and continue epoetin therapy. Type and screen. IV PPI.  # Allergy to PCN and metformin.  # CVA: Pt on lipitor but no antiplatelet.  We will followup again tomorrow. Please contact me if I can be of assistance in the meanwhile. Thank you for this consultation.   Body mass index is 24.07 kg/m.  Nutrition Problem: Increased nutrient needs Etiology: post-op healing Interventions: Interventions: Nepro shake      Diet: Currently n.p.o., will resume dialysis diet DVT Prophylaxis: Subcutaneous Heparin    Advance goals of care discussion: Full code  Family Communication: family was Not present at bedside, at the time of interview.  The pt provided permission to discuss medical plan with the family. Opportunity was given to ask question and all questions were answered satisfactorily.   Disposition:  Pt is from Home, admitted with Left tibial fracture, and double up chest pain with elevated troponin. Discharge to SNF  versus home health PT, when cleared by cardiology and orthopedics.  Subjective: Patient was admitted last night due to left blood pressure, medicine was consulted due to chest pain and elevated troponin.  Currently patient is chest pain-free.  Patient was seen by cardiologist and cleared for surgery of left double fracture.   Currently patient denied any active issues.  Pain is under control. We will continue to follow along during  hospital stay   Physical Exam: General:  alert oriented to time, place, and person.  Appear in mild distress, affect appropriate Eyes: PERRLA ENT: Oral Mucosa Clear, moist  Neck: no JVD,  Cardiovascular: S1 and S2 Present, no Murmur,  Respiratory: good respiratory effort, Bilateral Air entry equal and Decreased, no Crackles, n wheezes Abdomen: Bowel Sound present, Soft and no tenderness,  Skin: no rashes Extremities: no Pedal edema, no calf tenderness Neurologic: without any new focal findings Gait not checked due to patient safety concerns  Vitals:   03/21/21 0454 03/21/21 0752 03/21/21 1131 03/21/21 1230  BP: (!) 171/64 (!) 158/53 (!) 167/64 (!) 165/66  Pulse: 63 64 62 64  Resp: 20 19 19 12   Temp: 98.1 F (36.7 C) 98.3 F (36.8 C) 98.4 F (36.9 C) 98.3 F (36.8 C)  TempSrc:    Temporal  SpO2: 91% 94% 93% 92%  Height:        Intake/Output Summary (Last 24 hours) at 03/21/2021 1442 Last data filed at 03/21/2021 5176 Gross per 24 hour  Intake --  Output 0 ml  Net 0 ml   There were no vitals filed for this visit.  Data Reviewed: I have personally reviewed and interpreted daily labs, tele strips, imagings as discussed above. I reviewed all nursing notes, pharmacy notes, vitals, pertinent old records I have discussed plan of care as described above with RN and patient/family.  CBC: Recent Labs  Lab 03/20/21 1818 03/21/21 0432  WBC 6.2 5.0  NEUTROABS 4.3  --   HGB 8.4* 8.9*  HCT 26.6* 27.8*  MCV 99.3 99.6  PLT 146* 160   Basic Metabolic Panel: Recent Labs  Lab 03/20/21 1818 03/21/21 0432  NA 137 136  K 3.8 4.0  CL 102 103  CO2 22 22  GLUCOSE 222* 269*  BUN 47* 52*  CREATININE 5.71* 6.13*  CALCIUM 8.0* 7.9*    Studies: DG Chest 1 View  Result Date: 03/20/2021 CLINICAL DATA:  Chest pain leg fracture EXAM: CHEST  1 VIEW COMPARISON:  02/12/2021 FINDINGS: Cardiomegaly with vascular congestion and mild interstitial edema. No consolidation or pneumothorax  IMPRESSION: Cardiomegaly with vascular congestion and mild interstitial edema. Electronically Signed   By: Donavan Foil M.D.   On: 03/20/2021 19:43   DG Tibia/Fibula Left  Result Date: 03/20/2021 CLINICAL DATA:  Fall with fracture on Sunday night.  Re-injured. EXAM: LEFT TIBIA AND FIBULA - 2 VIEW COMPARISON:  Radiograph 03/16/2021 FINDINGS: Displaced spiral fracture of the distal tibial shaft has increased displacement from prior exam. No evidence of intra-articular extension, no ankle joint effusion. There is both a healed remote is well as acute fracture of the fibular neck. Acute component was not seen on prior. There is posterior splint material in place. Bones diffusely under mineralized. IMPRESSION: 1. Displaced spiral fracture of the distal tibial shaft, increased displacement from prior exam. 2. New acute fracture of the proximal fibular neck. This was not seen on prior exam, there is a chronic healed proximal fibular fracture. Electronically Signed   By: Keith Rake M.D.   On:  03/20/2021 19:26   ECHOCARDIOGRAM COMPLETE  Result Date: 03/21/2021    ECHOCARDIOGRAM REPORT   Patient Name:   Richard Zavala Date of Exam: 03/21/2021 Medical Rec #:  [id number]       Height:       68.0 in Accession #:    [id number]      Weight:       158.3 lb Date of Birth:  1961-06-14       BSA:          1.850 m Patient Age:    51 years        BP:           158/53 mmHg Patient Gender: M               HR:           62 bpm. Exam Location:  [facility] Procedure: 2D Echo, Color Doppler, Cardiac Doppler and Strain Analysis Indications:     O96.29 CHF-Acute Systolic  History:         Patient has prior history of Echocardiogram examinations. CHF,                  CAD, TIA and CKD; Risk Factors:Hypertension and Dyslipidemia.  Sonographer:     Charmayne Sheer RDCS (AE) Referring Phys:  [place] Diagnosing Phys: Isaias Cowman MD IMPRESSIONS  1. Left ventricular ejection fraction, by estimation, is 55 to 60%. The left  ventricle has normal function. The left ventricle has no regional wall motion abnormalities. Left ventricular diastolic parameters were normal.  2. Right ventricular systolic function is normal. The right ventricular size is normal.  3. The mitral valve is normal in structure. Mild to moderate mitral valve regurgitation. No evidence of mitral stenosis.  4. Tricuspid valve regurgitation is mild to moderate. Mild tricuspid stenosis.  5. The aortic valve is normal in structure. Aortic valve regurgitation is not visualized. Mild to moderate aortic valve sclerosis/calcification is present, without any evidence of aortic stenosis.  6. The inferior vena cava is normal in size with greater than 50% respiratory variability, suggesting right atrial pressure of 3 mmHg. FINDINGS  Left Ventricle: Left ventricular ejection fraction, by estimation, is 55 to 60%. The left ventricle has normal function. The left ventricle has no regional wall motion abnormalities. The left ventricular internal cavity size was normal in size. There is  no left ventricular hypertrophy. Left ventricular diastolic parameters were normal. Right Ventricle: The right ventricular size is normal. No increase in right ventricular wall thickness. Right ventricular systolic function is normal. Left Atrium: Left atrial size was normal in size. Right Atrium: Right atrial size was normal in size. Pericardium: There is no evidence of pericardial effusion. Mitral Valve: The mitral valve is normal in structure. Mild to moderate mitral valve regurgitation. No evidence of mitral valve stenosis. MV peak gradient, 3.8 mmHg. The mean mitral valve gradient is 2.0 mmHg. Tricuspid Valve: The tricuspid valve is normal in structure. Tricuspid valve regurgitation is mild to moderate. Mild tricuspid stenosis. Aortic Valve: The aortic valve is normal in structure. Aortic valve regurgitation is not visualized. Mild to moderate aortic valve sclerosis/calcification is present,  without any evidence of aortic stenosis. Aortic valve mean gradient measures 5.0 mmHg. Aortic valve peak gradient measures 8.6 mmHg. Aortic valve area, by VTI measures 4.23 cm. Pulmonic Valve: The pulmonic valve was normal in structure. Pulmonic valve regurgitation is not visualized. No evidence of pulmonic stenosis. Aorta: The aortic root is normal in size  and structure. Venous: The inferior vena cava is normal in size with greater than 50% respiratory variability, suggesting right atrial pressure of 3 mmHg. IAS/Shunts: No atrial level shunt detected by color flow Doppler.  LEFT VENTRICLE PLAX 2D LVIDd:         5.60 cm  Diastology LVIDs:         4.00 cm  LV e' medial:    3.81 cm/s LV PW:         1.60 cm  LV E/e' medial:  26.8 LV IVS:        1.30 cm  LV e' lateral:   6.64 cm/s LVOT diam:     2.50 cm  LV E/e' lateral: 15.4 LV SV:         120 LV SV Index:   65 LVOT Area:     4.91 cm  RIGHT VENTRICLE RV Basal diam:  3.80 cm LEFT ATRIUM             Index       RIGHT ATRIUM           Index LA diam:        4.70 cm 2.54 cm/m  RA Area:     24.00 cm LA Vol (A2C):   68.3 ml 36.91 ml/m RA Volume:   76.20 ml  41.18 ml/m LA Vol (A4C):   64.5 ml 34.86 ml/m LA Biplane Vol: 68.1 ml 36.80 ml/m  AORTIC VALVE                   PULMONIC VALVE AV Area (Vmax):    3.81 cm    PV Vmax:       1.00 m/s AV Area (Vmean):   3.65 cm    PV Vmean:      62.100 cm/s AV Area (VTI):     4.23 cm    PV VTI:        0.178 m AV Vmax:           147.00 cm/s PV Peak grad:  4.0 mmHg AV Vmean:          98.600 cm/s PV Mean grad:  2.0 mmHg AV VTI:            0.283 m AV Peak Grad:      8.6 mmHg AV Mean Grad:      5.0 mmHg LVOT Vmax:         114.00 cm/s LVOT Vmean:        73.400 cm/s LVOT VTI:          0.244 m LVOT/AV VTI ratio: 0.86  AORTA Ao Root diam: 3.60 cm MITRAL VALVE                 TRICUSPID VALVE MV Area (PHT): 4.15 cm      TR Peak grad:   34.3 mmHg MV Area VTI:   3.27 cm      TR Vmax:        293.00 cm/s MV Peak grad:  3.8 mmHg MV Mean grad:   2.0 mmHg      SHUNTS MV Vmax:       0.97 m/s      Systemic VTI:  0.24 m MV Vmean:      69.3 cm/s     Systemic Diam: 2.50 cm MV Decel Time: 183 msec MR Peak grad:    139.7 mmHg MR Mean grad:    81.0 mmHg MR Vmax:  591.00 cm/s MR Vmean:        420.0 cm/s MR PISA:         1.57 cm MR PISA Eff ROA: 8 mm MR PISA Radius:  0.50 cm MV E velocity: 102.00 cm/s MV A velocity: 108.00 cm/s MV E/A ratio:  0.94 Isaias Cowman MD Electronically signed by Isaias Cowman MD Signature Date/Time: 03/21/2021/11:50:59 AM    Final     Scheduled Meds: . [MAR Hold] amLODipine  10 mg Oral Daily  . [MAR Hold] atorvastatin  80 mg Oral QHS  . [MAR Hold] carvedilol  6.25 mg Oral BID  . [MAR Hold] Chlorhexidine Gluconate Cloth  6 each Topical Q0600  . [MAR Hold] docusate sodium  100 mg Oral BID  . epoetin (EPOGEN/PROCRIT) injection  10,000 Units Intravenous Q M,W,F-HD  . [MAR Hold] insulin aspart  0-15 Units Subcutaneous Q4H  . [MAR Hold] pantoprazole (PROTONIX) IV  40 mg Intravenous Q12H   Continuous Infusions: . sodium chloride 75 mL/hr at 03/21/21 1344  . [MAR Hold] methocarbamol (ROBAXIN) IV     PRN Meds: [MAR Hold] albuterol, [MAR Hold] bisacodyl, [MAR Hold] hydrALAZINE, [MAR Hold]  HYDROmorphone (DILAUDID) injection, [MAR Hold] magnesium citrate, [MAR Hold] methocarbamol **OR** [MAR Hold] methocarbamol (ROBAXIN) IV, neomycin-polymyxin B, [MAR Hold] oxyCODONE, [MAR Hold] senna-docusate  Time spent: 35 minutes  Author: Val Riles. MD [facility] 03/21/2021 2:42 PM  To reach On-call, see care teams to locate the attending and reach out to them via [email or web address]. If 7PM-7AM, please contact night-coverage If you still have difficulty reaching the attending provider, please page the [facility] (Director on Call) for [facility] on amion for assistance.

## 2021-03-22 DIAGNOSIS — S82402G Unspecified fracture of shaft of left fibula, subsequent encounter for closed fracture with delayed healing: Secondary | ICD-10-CM | POA: Diagnosis not present

## 2021-03-22 DIAGNOSIS — S82202G Unspecified fracture of shaft of left tibia, subsequent encounter for closed fracture with delayed healing: Secondary | ICD-10-CM | POA: Diagnosis not present

## 2021-03-22 LAB — BASIC METABOLIC PANEL
Anion gap: 13 (ref 5–15)
BUN: 68 mg/dL — ABNORMAL HIGH (ref 6–20)
CO2: 20 mmol/L — ABNORMAL LOW (ref 22–32)
Calcium: 8 mg/dL — ABNORMAL LOW (ref 8.9–10.3)
Chloride: 103 mmol/L (ref 98–111)
Creatinine, Ser: 7.1 mg/dL — ABNORMAL HIGH (ref 0.61–1.24)
GFR, Estimated: 8 mL/min — ABNORMAL LOW (ref >60)
Glucose, Bld: 327 mg/dL — ABNORMAL HIGH (ref 70–99)
Potassium: 5.2 mmol/L — ABNORMAL HIGH (ref 3.5–5.1)
Sodium: 136 mmol/L (ref 135–145)

## 2021-03-22 LAB — GLUCOSE, CAPILLARY
Glucose-Capillary: 165 mg/dL — ABNORMAL HIGH (ref 70–99)
Glucose-Capillary: 206 mg/dL — ABNORMAL HIGH (ref 70–99)
Glucose-Capillary: 248 mg/dL — ABNORMAL HIGH (ref 70–99)
Glucose-Capillary: 271 mg/dL — ABNORMAL HIGH (ref 70–99)
Glucose-Capillary: 280 mg/dL — ABNORMAL HIGH (ref 70–99)
Glucose-Capillary: 345 mg/dL — ABNORMAL HIGH (ref 70–99)

## 2021-03-22 LAB — CBC
HCT: 28.1 % — ABNORMAL LOW (ref 39.0–52.0)
Hemoglobin: 9 g/dL — ABNORMAL LOW (ref 13.0–17.0)
MCH: 31.4 pg (ref 26.0–34.0)
MCHC: 32 g/dL (ref 30.0–36.0)
MCV: 97.9 fL (ref 80.0–100.0)
Platelets: 185 10*3/uL (ref 150–400)
RBC: 2.87 MIL/uL — ABNORMAL LOW (ref 4.22–5.81)
RDW: 18.7 % — ABNORMAL HIGH (ref 11.5–15.5)
WBC: 5.6 10*3/uL (ref 4.0–10.5)
nRBC: 0 % (ref 0.0–0.2)

## 2021-03-22 LAB — HEPATITIS B SURFACE ANTIGEN: Hepatitis B Surface Ag: NONREACTIVE

## 2021-03-22 LAB — MAGNESIUM: Magnesium: 2.1 mg/dL (ref 1.7–2.4)

## 2021-03-22 LAB — PHOSPHORUS: Phosphorus: 6.7 mg/dL — ABNORMAL HIGH (ref 2.5–4.6)

## 2021-03-22 MED ORDER — INSULIN GLARGINE 100 UNIT/ML ~~LOC~~ SOLN
10.0000 [IU] | Freq: Every day | SUBCUTANEOUS | Status: DC
  Administered 2021-03-22 – 2021-03-23 (×2): 10 [IU] via SUBCUTANEOUS
  Filled 2021-03-22 (×3): qty 0.1

## 2021-03-22 MED ORDER — AMLODIPINE BESYLATE 5 MG PO TABS
5.0000 mg | ORAL_TABLET | Freq: Every day | ORAL | Status: DC
  Administered 2021-03-23: 5 mg via ORAL
  Filled 2021-03-22 (×2): qty 1

## 2021-03-22 MED ORDER — CARVEDILOL 3.125 MG PO TABS
3.1250 mg | ORAL_TABLET | Freq: Two times a day (BID) | ORAL | Status: DC
  Administered 2021-03-22 – 2021-03-25 (×6): 3.125 mg via ORAL
  Filled 2021-03-22 (×7): qty 1

## 2021-03-22 NOTE — Progress Notes (Signed)
[place], [place] 03/22/21  Subjective:   LOS: 2  Richard Zavala is a 60 y.o. male and established patient of [facility]. He has a past medical history of ESRD on hemodialysis, anemia, diabetes, CAD, CHF, pulmonary hypertension and osteoporosis. He presents to the ED with Tibia/fibula fracture and will undergo surgery later today.  Patient is seen today laying in bed Alert and oriented Says surgery went well yesterday and pain is controlled Tolerated meals without nausea  Patient seen later during dialysis   HEMODIALYSIS FLOWSHEET:  Blood Flow Rate (mL/min): 400 mL/min Arterial Pressure (mmHg): -150 mmHg Venous Pressure (mmHg): 160 mmHg Transmembrane Pressure (mmHg): 60 mmHg Ultrafiltration Rate (mL/min): 530 mL/min Dialysate Flow Rate (mL/min): 500 ml/min Conductivity: Machine : 15.1 Conductivity: Machine : 15.1 Dialysis Fluid Bolus: Normal Saline Bolus Amount (mL): 100 mL (given to prevent clotting, added to UF goal)     Objective:  Vital signs in last 24 hours:  Temp:  [97.2 F (36.2 C)-98.4 F (36.9 C)] 97.6 F (36.4 C) (04/02 0737) Pulse Rate:  [53-64] 59 (04/02 0737) Resp:  [12-19] 18 (04/02 0737) BP: (114-167)/(46-66) 156/61 (04/02 0737) SpO2:  [89 %-100 %] 100 % (04/02 0737)  Weight change:  There were no vitals filed for this visit.  Intake/Output:    Intake/Output Summary (Last 24 hours) at 03/22/2021 1012 Last data filed at 03/21/2021 1840 Gross per 24 hour  Intake 768.75 ml  Output 25 ml  Net 743.75 ml     Physical Exam: General: NAD, laying in bed  HEENT Normocephalic, moist oral mucosa  Pulm/lungs Clear sounds, regular breathing pattern  CVS/Heart S1S2 present, regular  Abdomen:  Soft, non tender, nondistended  Extremities: Min edema, left LE surgical dressing  Neurologic: Alert, oriented, able to answer questions  Skin: No rashes or masses  Access: Rt lower arm AVF       Basic Metabolic Panel:  Recent Labs   Lab 03/20/21 1818 03/21/21 0432 03/22/21 0411  NA 137 136 136  K 3.8 4.0 5.2*  CL 102 103 103  CO2 22 22 20*  GLUCOSE 222* 269* 327*  BUN 47* 52* 68*  CREATININE 5.71* 6.13* 7.10*  CALCIUM 8.0* 7.9* 8.0*  MG  --   --  2.1  PHOS  --   --  6.7*     CBC: Recent Labs  Lab 03/20/21 1818 03/21/21 0432 03/22/21 0411  WBC 6.2 5.0 5.6  NEUTROABS 4.3  --   --   HGB 8.4* 8.9* 9.0*  HCT 26.6* 27.8* 28.1*  MCV 99.3 99.6 97.9  PLT 146* 151 185      Lab Results  Component Value Date   HEPBSAG NON REACTIVE 02/07/2021   HEPBSAB Non Reactive 08/14/2015      Microbiology:  Recent Results (from the past 240 hour(s))  Resp Panel by RT-PCR (Flu A&B, Covid) Nasopharyngeal Swab     Status: None   Collection Time: 03/20/21  6:17 PM   Specimen: Nasopharyngeal Swab; Nasopharyngeal(NP) swabs in vial transport medium  Result Value Ref Range Status   SARS Coronavirus 2 by RT PCR NEGATIVE NEGATIVE Final    Comment: (NOTE) SARS-CoV-2 target nucleic acids are NOT DETECTED.  The SARS-CoV-2 RNA is generally detectable in upper respiratory specimens during the acute phase of infection. The lowest concentration of SARS-CoV-2 viral copies this assay can detect is 138 copies/mL. A negative result does not preclude SARS-Cov-2 infection and should not be used as the sole basis for treatment or other patient management  decisions. A negative result may occur with  improper specimen collection/handling, submission of specimen other than nasopharyngeal swab, presence of viral mutation(s) within the areas targeted by this assay, and inadequate number of viral copies(<138 copies/mL). A negative result must be combined with clinical observations, patient history, and epidemiological information. The expected result is Negative.  Fact Sheet for Patients:  EntrepreneurPulse.com.au  Fact Sheet for Healthcare Providers:  IncredibleEmployment.be  This test is no  t yet approved or cleared by the [place] FDA and  has been authorized for detection and/or diagnosis of SARS-CoV-2 by FDA under an Emergency Use Authorization (EUA). This EUA will remain  in effect (meaning this test can be used) for the duration of the COVID-19 declaration under Section 564(b)(1) of the Act, 21 U.S.C.section 360bbb-3(b)(1), unless the authorization is terminated  or revoked sooner.       Influenza A by PCR NEGATIVE NEGATIVE Final   Influenza B by PCR NEGATIVE NEGATIVE Final    Comment: (NOTE) The Xpert Xpress SARS-CoV-2/FLU/RSV plus assay is intended as an aid in the diagnosis of influenza from Nasopharyngeal swab specimens and should not be used as a sole basis for treatment. Nasal washings and aspirates are unacceptable for Xpert Xpress SARS-CoV-2/FLU/RSV testing.  Fact Sheet for Patients: EntrepreneurPulse.com.au  Fact Sheet for Healthcare Providers: IncredibleEmployment.be  This test is not yet approved or cleared by the [place] FDA and has been authorized for detection and/or diagnosis of SARS-CoV-2 by FDA under an Emergency Use Authorization (EUA). This EUA will remain in effect (meaning this test can be used) for the duration of the COVID-19 declaration under Section 564(b)(1) of the Act, 21 U.S.C. section 360bbb-3(b)(1), unless the authorization is terminated or revoked.  Performed at [facility], [address] [place], [place] [address]     Coagulation Studies: Recent Labs    03/20/21 1818  LABPROT 16.4*  INR 1.4*    Urinalysis: No results for input(s): COLORURINE, LABSPEC, PHURINE, GLUCOSEU, HGBUR, BILIRUBINUR, KETONESUR, PROTEINUR, UROBILINOGEN, NITRITE, LEUKOCYTESUR in the last 72 hours.  Invalid input(s): APPERANCEUR    Imaging: DG Chest 1 View  Result Date: 03/20/2021 CLINICAL DATA:  Chest pain leg fracture EXAM: CHEST  1 VIEW COMPARISON:  02/12/2021 FINDINGS:  Cardiomegaly with vascular congestion and mild interstitial edema. No consolidation or pneumothorax IMPRESSION: Cardiomegaly with vascular congestion and mild interstitial edema. Electronically Signed   By: Donavan Foil M.D.   On: 03/20/2021 19:43   DG Tibia/Fibula Left  Result Date: 03/21/2021 CLINICAL DATA:  Surgery.  Tibial fracture EXAM: DG C-ARM 1-60 MIN; LEFT TIBIA AND FIBULA - 2 VIEW FLUOROSCOPY TIME:  Fluoroscopy Time:  1 minutes 59 seconds. Number of Acquired Spot Images: 6 COMPARISON:  March 20, 2021. FINDINGS: Six C-arm fluoroscopic images were obtained intraoperatively and submitted for post operative interpretation. These images demonstrate intramedullary rod and screw fixation of a spiral fracture of the distal tibial diaphysis. Improved, near anatomic alignment. Fibular neck fracture was better characterized on prior radiographs. Please see the performing provider's procedural report for further detail. IMPRESSION: Intraoperative fluoroscopy, as detailed above. Electronically Signed   By: Margaretha Sheffield MD   On: 03/21/2021 17:23   DG Tibia/Fibula Left  Result Date: 03/20/2021 CLINICAL DATA:  Fall with fracture on Sunday night.  Re-injured. EXAM: LEFT TIBIA AND FIBULA - 2 VIEW COMPARISON:  Radiograph 03/16/2021 FINDINGS: Displaced spiral fracture of the distal tibial shaft has increased displacement from prior exam. No evidence of intra-articular extension, no ankle joint effusion. There is both a  healed remote is well as acute fracture of the fibular neck. Acute component was not seen on prior. There is posterior splint material in place. Bones diffusely under mineralized. IMPRESSION: 1. Displaced spiral fracture of the distal tibial shaft, increased displacement from prior exam. 2. New acute fracture of the proximal fibular neck. This was not seen on prior exam, there is a chronic healed proximal fibular fracture. Electronically Signed   By: Keith Rake M.D.   On: 03/20/2021 19:26    DG Tibia/Fibula Left Port  Result Date: 03/21/2021 CLINICAL DATA:  Post ORIF. EXAM: PORTABLE LEFT TIBIA AND FIBULA - 2 VIEW COMPARISON:  Intraoperative radiographs earlier the same date and 03/20/2021. FINDINGS: Left tibial intramedullary nail is secured by 2 proximal and 2 distal interlocking screws. The hardware appears well positioned. There is improved alignment of the spiral fracture of the mid to distal tibial diaphysis. Nondisplaced fracture of the proximal fibula appears unchanged. There is underlying posttraumatic deformity of the proximal fibular diaphysis. No complications are identified. Diffuse vascular calcifications are noted. There are skin staples anterior to the patellar tendon. IMPRESSION: Improved alignment of the tibial fracture post ORIF. No demonstrated complication. Electronically Signed   By: Richardean Sale M.D.   On: 03/21/2021 17:29   DG C-Arm 1-60 Min  Result Date: 03/21/2021 CLINICAL DATA:  Surgery.  Tibial fracture EXAM: DG C-ARM 1-60 MIN; LEFT TIBIA AND FIBULA - 2 VIEW FLUOROSCOPY TIME:  Fluoroscopy Time:  1 minutes 59 seconds. Number of Acquired Spot Images: 6 COMPARISON:  March 20, 2021. FINDINGS: Six C-arm fluoroscopic images were obtained intraoperatively and submitted for post operative interpretation. These images demonstrate intramedullary rod and screw fixation of a spiral fracture of the distal tibial diaphysis. Improved, near anatomic alignment. Fibular neck fracture was better characterized on prior radiographs. Please see the performing provider's procedural report for further detail. IMPRESSION: Intraoperative fluoroscopy, as detailed above. Electronically Signed   By: Margaretha Sheffield MD   On: 03/21/2021 17:23   ECHOCARDIOGRAM COMPLETE  Result Date: 03/21/2021    ECHOCARDIOGRAM REPORT   Patient Name:   Richard Zavala Date of Exam: 03/21/2021 Medical Rec #:  [id number]       Height:       68.0 in Accession #:    [id number]      Weight:       158.3 lb Date of  Birth:  Feb 07, 1961       BSA:          1.850 m Patient Age:    5 years        BP:           158/53 mmHg Patient Gender: M               HR:           62 bpm. Exam Location:  [facility] Procedure: 2D Echo, Color Doppler, Cardiac Doppler and Strain Analysis Indications:     X52.84 CHF-Acute Systolic  History:         Patient has prior history of Echocardiogram examinations. CHF,                  CAD, TIA and CKD; Risk Factors:Hypertension and Dyslipidemia.  Sonographer:     Charmayne Sheer RDCS (AE) Referring Phys:  [place] Diagnosing Phys: Isaias Cowman MD IMPRESSIONS  1. Left ventricular ejection fraction, by estimation, is 55 to 60%. The left ventricle has normal function. The left ventricle has no regional wall motion abnormalities.  Left ventricular diastolic parameters were normal.  2. Right ventricular systolic function is normal. The right ventricular size is normal.  3. The mitral valve is normal in structure. Mild to moderate mitral valve regurgitation. No evidence of mitral stenosis.  4. Tricuspid valve regurgitation is mild to moderate. Mild tricuspid stenosis.  5. The aortic valve is normal in structure. Aortic valve regurgitation is not visualized. Mild to moderate aortic valve sclerosis/calcification is present, without any evidence of aortic stenosis.  6. The inferior vena cava is normal in size with greater than 50% respiratory variability, suggesting right atrial pressure of 3 mmHg. FINDINGS  Left Ventricle: Left ventricular ejection fraction, by estimation, is 55 to 60%. The left ventricle has normal function. The left ventricle has no regional wall motion abnormalities. The left ventricular internal cavity size was normal in size. There is  no left ventricular hypertrophy. Left ventricular diastolic parameters were normal. Right Ventricle: The right ventricular size is normal. No increase in right ventricular wall thickness. Right ventricular systolic function is normal. Left Atrium: Left  atrial size was normal in size. Right Atrium: Right atrial size was normal in size. Pericardium: There is no evidence of pericardial effusion. Mitral Valve: The mitral valve is normal in structure. Mild to moderate mitral valve regurgitation. No evidence of mitral valve stenosis. MV peak gradient, 3.8 mmHg. The mean mitral valve gradient is 2.0 mmHg. Tricuspid Valve: The tricuspid valve is normal in structure. Tricuspid valve regurgitation is mild to moderate. Mild tricuspid stenosis. Aortic Valve: The aortic valve is normal in structure. Aortic valve regurgitation is not visualized. Mild to moderate aortic valve sclerosis/calcification is present, without any evidence of aortic stenosis. Aortic valve mean gradient measures 5.0 mmHg. Aortic valve peak gradient measures 8.6 mmHg. Aortic valve area, by VTI measures 4.23 cm. Pulmonic Valve: The pulmonic valve was normal in structure. Pulmonic valve regurgitation is not visualized. No evidence of pulmonic stenosis. Aorta: The aortic root is normal in size and structure. Venous: The inferior vena cava is normal in size with greater than 50% respiratory variability, suggesting right atrial pressure of 3 mmHg. IAS/Shunts: No atrial level shunt detected by color flow Doppler.  LEFT VENTRICLE PLAX 2D LVIDd:         5.60 cm  Diastology LVIDs:         4.00 cm  LV e' medial:    3.81 cm/s LV PW:         1.60 cm  LV E/e' medial:  26.8 LV IVS:        1.30 cm  LV e' lateral:   6.64 cm/s LVOT diam:     2.50 cm  LV E/e' lateral: 15.4 LV SV:         120 LV SV Index:   65 LVOT Area:     4.91 cm  RIGHT VENTRICLE RV Basal diam:  3.80 cm LEFT ATRIUM             Index       RIGHT ATRIUM           Index LA diam:        4.70 cm 2.54 cm/m  RA Area:     24.00 cm LA Vol (A2C):   68.3 ml 36.91 ml/m RA Volume:   76.20 ml  41.18 ml/m LA Vol (A4C):   64.5 ml 34.86 ml/m LA Biplane Vol: 68.1 ml 36.80 ml/m  AORTIC VALVE  PULMONIC VALVE AV Area (Vmax):    3.81 cm    PV Vmax:        1.00 m/s AV Area (Vmean):   3.65 cm    PV Vmean:      62.100 cm/s AV Area (VTI):     4.23 cm    PV VTI:        0.178 m AV Vmax:           147.00 cm/s PV Peak grad:  4.0 mmHg AV Vmean:          98.600 cm/s PV Mean grad:  2.0 mmHg AV VTI:            0.283 m AV Peak Grad:      8.6 mmHg AV Mean Grad:      5.0 mmHg LVOT Vmax:         114.00 cm/s LVOT Vmean:        73.400 cm/s LVOT VTI:          0.244 m LVOT/AV VTI ratio: 0.86  AORTA Ao Root diam: 3.60 cm MITRAL VALVE                 TRICUSPID VALVE MV Area (PHT): 4.15 cm      TR Peak grad:   34.3 mmHg MV Area VTI:   3.27 cm      TR Vmax:        293.00 cm/s MV Peak grad:  3.8 mmHg MV Mean grad:  2.0 mmHg      SHUNTS MV Vmax:       0.97 m/s      Systemic VTI:  0.24 m MV Vmean:      69.3 cm/s     Systemic Diam: 2.50 cm MV Decel Time: 183 msec MR Peak grad:    139.7 mmHg MR Mean grad:    81.0 mmHg MR Vmax:         591.00 cm/s MR Vmean:        420.0 cm/s MR PISA:         1.57 cm MR PISA Eff ROA: 8 mm MR PISA Radius:  0.50 cm MV E velocity: 102.00 cm/s MV A velocity: 108.00 cm/s MV E/A ratio:  0.94 Isaias Cowman MD Electronically signed by Isaias Cowman MD Signature Date/Time: 03/21/2021/11:50:59 AM    Final      Medications:   . sodium chloride 75 mL/hr at 03/21/21 2316  . sodium chloride    . sodium chloride    . methocarbamol (ROBAXIN) IV     . acetaminophen  1,000 mg Oral Q6H  . amLODipine  10 mg Oral Daily  . atorvastatin  80 mg Oral QHS  . carvedilol  6.25 mg Oral BID  . docusate sodium  100 mg Oral BID  . epoetin (EPOGEN/PROCRIT) injection  10,000 Units Intravenous Q M,W,F-HD  . heparin injection (subcutaneous)  5,000 Units Subcutaneous Q8H  . insulin aspart  0-15 Units Subcutaneous Q4H  . insulin glargine  10 Units Subcutaneous QHS  . pantoprazole (PROTONIX) IV  40 mg Intravenous Q12H  . senna  1 tablet Oral BID  . traMADol  50 mg Oral Q6H   sodium chloride, sodium chloride, acetaminophen, albuterol, alteplase, alum & mag  hydroxide-simeth, bisacodyl, bisacodyl, heparin, hydrALAZINE, HYDROmorphone (DILAUDID) injection, lidocaine (PF), lidocaine-prilocaine, magnesium citrate, menthol-cetylpyridinium **OR** phenol, methocarbamol **OR** methocarbamol (ROBAXIN) IV, ondansetron **OR** ondansetron (ZOFRAN) IV, oxyCODONE, oxyCODONE, pentafluoroprop-tetrafluoroeth, polyethylene glycol  Assessment/ Plan:  60 y.o. male with  was admitted on  03/20/2021 for  Active Problems:   Tibia/fibula fracture, left, closed, with delayed healing, subsequent encounter  ESRD (end stage renal disease) ([facility]) [N18.6] Chest pain [R07.9] Tibia/fibula fracture, left, closed, initial encounter [S82.202A, S82.402A] Tibia/fibula fracture, left, closed, with delayed healing, subsequent encounter [S82.202G, S82.402G] Closed fracture of distal end of left tibia, unspecified fracture morphology, initial encounter [S82.302A]  [facility]/MWF/ Rt lower AVF/ 72kg  #. ESRD on HD - Dialysis scheduled today - UF goal 2L - Next treatment will be Monday  #. Anemia of CKD  Lab Results  Component Value Date   HGB 9.0 (L) 03/22/2021  EPO given outpatient EPO 10000 units with HD  #. Secondary hyperparathyroidism of renal origin N 25.81   No results found for: PTH Lab Results  Component Value Date   PHOS 6.7 (H) 03/22/2021  Elevated levels, will consider binders Monitor calcium and phos level during this admission   #. Diabetes type 2 with CKD Hemoglobin A1C (%)  Date Value  10/28/2012 8.3 (H)   Hgb A1c MFr Bld (%)  Date Value  03/21/2021 7.8 (H)  Monitoring   # Left spiral tibial shaft fracture - surgery performed on 03/21/21 - surgical dressing in place   LOS: [place] 4/2/202210:12 [place], [place]

## 2021-03-22 NOTE — Progress Notes (Signed)
[facility] Progress Note  Patient: Richard Zavala    CWU:[id number]  DOA: 03/20/2021     Date of Service: the patient was seen and examined on 03/22/2021  Chief Complaint  Patient presents with  . Leg Injury   Brief hospital course: Pt is 60 y/o male seen in ED as consult for left tibia #, that has gotten worse. Pt states he fell on left side from what It sounds like he fell off his bed.  Today pt is alert,awake and reports chest pain since yesterday and no other complaints except for pain.say she followed up with his cardiology February last year.  He has referral to go locally but has not seen md yet. He has not smoked in about 9 years. He does not drink alcohol.Son has been helping him lifting him and moving him at home for ADL's and otherwise because of pain.  In ed pt is A/O x 3 and EKG is negative for any st changes or TWI. Cardiology consulted for elevated troponin.   Assessment and Plan: Active Problems:   Tibia/fibula fracture, left, closed, with delayed healing, subsequent encounter  # Tibia/Fibula fracture: Orthopedic surgery following,  S/p intramedullary fixation of left spiral tibial shaft fracture done on 03/21/2021 Pt was at moderate anesthesia risk with h/o smoking and suspect underlying COPD. Pt also has heart diease history and c/o left precordial chest pain, currently patient is chest pain-free.  Seen by cardiologist and cleared for surgery. Continue as needed medication for pain control Further management as per primary team    # ESRD on HD: Nephrology consult for HD requested with Dr.Kolluru.  # Chest pain: Elevated troponin, trending down Currently patient is chest pain-free last echo in 2019 with EF of 60-65%. Hold antiplatelet, resume aspirin when cleared by orthopedics  cont coreg. BNP of 4500 and troponin of 222  Cardiology consult  EKG: S.Brady 59, Prolonged pR 216 and LVH, q waves on v1. TTE shows LVEF 55 to 60%, no LV wall motion  abnormality. Patient was seen by cardiologist and cleared for surgery.,  Recommended no ischemic work-up, as no significant EKG changes and troponins remained flat and ESRD status.   # Htn: On pervious admission pt's BP meds held except for coreg. 4/1 blood pressure was high so patient was given Coreg and amlodipine  4/2 blood pressure dropped yesterday evening, soft today  Decreased dose of Coreg and amlodipine, skip dose today and use tomorrow if blood pressure remains high, parameters entered. Use IV hydralazine as needed We will continue to monitor BP and titrate medication accordingly  # CAD: Continue atorvastatin, chart review shows patient is not on any antiplatelet,  We will start low-dose aspirin if no contraindications as into GI bleeding, or ICH.  # CHF: Pt's lasix held, we will monitor for weights and I/O's.  Elevated BNP, continue fluid management by hemodialysis   # IDDM T2,  4/2 started Lantus 10 units nightly Started Humalog sliding scale Monitor FSBG Continue diabetic diet  # Anemia of c/h kidney: We will monitor CBC and continue epoetin therapy. Type and screen. IV PPI.  # Allergy to PCN and metformin.  # CVA: Pt on lipitor but no antiplatelet.  We will followup again tomorrow. Please contact me if I can be of assistance in the meanwhile. Thank you for this consultation.   Body mass index is 24.07 kg/m.  Nutrition Problem: Increased nutrient needs Etiology: post-op healing Interventions: Interventions: Nepro shake      Diet: Currently n.p.o.,  will resume dialysis diet DVT Prophylaxis: Subcutaneous Heparin    Advance goals of care discussion: Full code  Family Communication: family was Not present at bedside, at the time of interview.  The pt provided permission to discuss medical plan with the family. Opportunity was given to ask question and all questions were answered satisfactorily.   Disposition:  Pt is from Home, admitted with Left  tibial fracture, and double up chest pain with elevated troponin. Discharge to SNF versus home health PT, when cleared by cardiology and orthopedics.  Subjective: Patient was admitted last night due to left blood pressure, medicine was consulted due to chest pain and elevated troponin.  Currently patient is chest pain-free.  Patient was seen by cardiologist and cleared for surgery of left double fracture.   Currently patient denied any active issues.  Pain is under control. We will continue to follow along during hospital stay   Physical Exam: General:  alert oriented to time, place, and person.  Appear in mild distress, affect appropriate Eyes: Left eye legally blind, decreased vision in the right eye ENT: Oral Mucosa Clear, moist  Neck: no JVD,  Cardiovascular: S1 and S2 Present, no Murmur,  Respiratory: good respiratory effort, Bilateral Air entry equal and Decreased, no Crackles, n wheezes Abdomen: Bowel Sound present, Soft and no tenderness,  Skin: no rashes Extremities: no Pedal edema, no calf tenderness,s/p left tibial fixation, dressing CDI Neurologic: without any new focal findings Gait not checked due to patient safety concerns  Vitals:   03/22/21 0737 03/22/21 1100 03/22/21 1115 03/22/21 1130  BP: (!) 156/61 (!) 104/53 (!) 98/58 113/60  Pulse: (!) 59 60  (!) 58  Resp: 18 18    Temp: 97.6 F (36.4 C)     TempSrc:      SpO2: 100% 100%  100%  Height:        Intake/Output Summary (Last 24 hours) at 03/22/2021 1203 Last data filed at 03/21/2021 1840 Gross per 24 hour  Intake 768.75 ml  Output 25 ml  Net 743.75 ml   There were no vitals filed for this visit.  Data Reviewed: I have personally reviewed and interpreted daily labs, tele strips, imagings as discussed above. I reviewed all nursing notes, pharmacy notes, vitals, pertinent old records I have discussed plan of care as described above with RN and patient/family.  CBC: Recent Labs  Lab 03/20/21 1818  03/21/21 0432 03/22/21 0411  WBC 6.2 5.0 5.6  NEUTROABS 4.3  --   --   HGB 8.4* 8.9* 9.0*  HCT 26.6* 27.8* 28.1*  MCV 99.3 99.6 97.9  PLT 146* 151 366   Basic Metabolic Panel: Recent Labs  Lab 03/20/21 1818 03/21/21 0432 03/22/21 0411  NA 137 136 136  K 3.8 4.0 5.2*  CL 102 103 103  CO2 22 22 20*  GLUCOSE 222* 269* 327*  BUN 47* 52* 68*  CREATININE 5.71* 6.13* 7.10*  CALCIUM 8.0* 7.9* 8.0*  MG  --   --  2.1  PHOS  --   --  6.7*    Studies: DG Tibia/Fibula Left  Result Date: 03/21/2021 CLINICAL DATA:  Surgery.  Tibial fracture EXAM: DG C-ARM 1-60 MIN; LEFT TIBIA AND FIBULA - 2 VIEW FLUOROSCOPY TIME:  Fluoroscopy Time:  1 minutes 59 seconds. Number of Acquired Spot Images: 6 COMPARISON:  March 20, 2021. FINDINGS: Six C-arm fluoroscopic images were obtained intraoperatively and submitted for post operative interpretation. These images demonstrate intramedullary rod and screw fixation of a spiral fracture of  the distal tibial diaphysis. Improved, near anatomic alignment. Fibular neck fracture was better characterized on prior radiographs. Please see the performing provider's procedural report for further detail. IMPRESSION: Intraoperative fluoroscopy, as detailed above. Electronically Signed   By: Margaretha Sheffield MD   On: 03/21/2021 17:23   DG Tibia/Fibula Left Port  Result Date: 03/21/2021 CLINICAL DATA:  Post ORIF. EXAM: PORTABLE LEFT TIBIA AND FIBULA - 2 VIEW COMPARISON:  Intraoperative radiographs earlier the same date and 03/20/2021. FINDINGS: Left tibial intramedullary nail is secured by 2 proximal and 2 distal interlocking screws. The hardware appears well positioned. There is improved alignment of the spiral fracture of the mid to distal tibial diaphysis. Nondisplaced fracture of the proximal fibula appears unchanged. There is underlying posttraumatic deformity of the proximal fibular diaphysis. No complications are identified. Diffuse vascular calcifications are noted. There  are skin staples anterior to the patellar tendon. IMPRESSION: Improved alignment of the tibial fracture post ORIF. No demonstrated complication. Electronically Signed   By: Richardean Sale M.D.   On: 03/21/2021 17:29   DG C-Arm 1-60 Min  Result Date: 03/21/2021 CLINICAL DATA:  Surgery.  Tibial fracture EXAM: DG C-ARM 1-60 MIN; LEFT TIBIA AND FIBULA - 2 VIEW FLUOROSCOPY TIME:  Fluoroscopy Time:  1 minutes 59 seconds. Number of Acquired Spot Images: 6 COMPARISON:  March 20, 2021. FINDINGS: Six C-arm fluoroscopic images were obtained intraoperatively and submitted for post operative interpretation. These images demonstrate intramedullary rod and screw fixation of a spiral fracture of the distal tibial diaphysis. Improved, near anatomic alignment. Fibular neck fracture was better characterized on prior radiographs. Please see the performing provider's procedural report for further detail. IMPRESSION: Intraoperative fluoroscopy, as detailed above. Electronically Signed   By: Margaretha Sheffield MD   On: 03/21/2021 17:23    Scheduled Meds: . acetaminophen  1,000 mg Oral Q6H  . amLODipine  10 mg Oral Daily  . atorvastatin  80 mg Oral QHS  . carvedilol  6.25 mg Oral BID  . docusate sodium  100 mg Oral BID  . epoetin (EPOGEN/PROCRIT) injection  10,000 Units Intravenous Q M,W,F-HD  . heparin injection (subcutaneous)  5,000 Units Subcutaneous Q8H  . insulin aspart  0-15 Units Subcutaneous Q4H  . insulin glargine  10 Units Subcutaneous QHS  . pantoprazole (PROTONIX) IV  40 mg Intravenous Q12H  . senna  1 tablet Oral BID  . traMADol  50 mg Oral Q6H   Continuous Infusions: . sodium chloride 75 mL/hr at 03/21/21 2316  . sodium chloride    . sodium chloride    . methocarbamol (ROBAXIN) IV     PRN Meds: sodium chloride, sodium chloride, acetaminophen, albuterol, alteplase, alum & mag hydroxide-simeth, bisacodyl, bisacodyl, heparin, hydrALAZINE, HYDROmorphone (DILAUDID) injection, lidocaine (PF),  lidocaine-prilocaine, magnesium citrate, menthol-cetylpyridinium **OR** phenol, methocarbamol **OR** methocarbamol (ROBAXIN) IV, ondansetron **OR** ondansetron (ZOFRAN) IV, oxyCODONE, oxyCODONE, pentafluoroprop-tetrafluoroeth, polyethylene glycol  Time spent: 35 minutes  Author: Val Riles. MD [facility] 03/22/2021 12:03 PM  To reach On-call, see care teams to locate the attending and reach out to them via [email or web address]. If 7PM-7AM, please contact night-coverage If you still have difficulty reaching the attending provider, please page the [facility] (Director on Call) for [facility] on amion for assistance.

## 2021-03-22 NOTE — Progress Notes (Signed)
PT Cancellation Note  Patient Details Name: Richard Zavala MRN: [id number] DOB: 09-Mar-1961   Cancelled Treatment:    Reason Eval/Treat Not Completed: Patient at procedure or test/unavailable; Pt at HD and unavailable for participation with PT services this afternoon.  Will attempt to see pt at a future date/time as medically appropriate.   Linus Salmons PT, DPT 03/22/21, 2:45 PM

## 2021-03-22 NOTE — Progress Notes (Signed)
OT Cancellation Note  Patient Details Name: Richard Zavala MRN: [id number] DOB: Jun 18, 1961   Cancelled Treatment:      OT order received, patient in dialysis this date, will continue attempts for OT evaluation when patient is available.   Richard Zavala T Tomasita Morrow, OTR/L, CLT  Richard Zavala 03/22/2021, 12:36 PM

## 2021-03-22 NOTE — Progress Notes (Signed)
PT Cancellation Note  Patient Details Name: Richard Zavala MRN: [id number] DOB: 1961-09-06   Cancelled Treatment:    Reason Eval/Treat Not Completed: Patient at procedure or test/unavailable; Per nsg pt at HD.  Will attempt to see pt at a future date/time as medically appropriate.     Linus Salmons PT, DPT 03/22/21, 11:08 AM

## 2021-03-22 NOTE — Progress Notes (Signed)
[facility]  SUBJECTIVE: Patient laying in bed, doing well postop, denies chest pain or shortness of breath   Vitals:   03/21/21 2047 03/22/21 0014 03/22/21 0348 03/22/21 0737  BP: (!) 139/56 (!) 150/58 (!) 148/66 (!) 156/61  Pulse: 61 61 (!) 59 (!) 59  Resp: 18 19 19 18   Temp: 97.6 F (36.4 C) 98.2 F (36.8 C) (!) 97.5 F (36.4 C) 97.6 F (36.4 C)  TempSrc:      SpO2: 94% 96% 98% 100%  Height:         Intake/Output Summary (Last 24 hours) at 03/22/2021 4403 Last data filed at 03/21/2021 1840 Gross per 24 hour  Intake 768.75 ml  Output 25 ml  Net 743.75 ml      PHYSICAL EXAM  General: Well developed, well nourished, in no acute distress HEENT:  Normocephalic and atramatic Neck:  No JVD.  Lungs: Clear bilaterally to auscultation and percussion. Heart: HRRR . Normal S1 and S2 without gallops or murmurs.  Abdomen: Bowel sounds are positive, abdomen soft and non-tender  Msk:  Back normal, normal gait. Normal strength and tone for age. Extremities: No clubbing, cyanosis or edema.   Neuro: Alert and oriented X 3. Psych:  Good affect, responds appropriately   LABS: Basic Metabolic Panel: Recent Labs    03/21/21 0432 03/22/21 0411  NA 136 136  K 4.0 5.2*  CL 103 103  CO2 22 20*  GLUCOSE 269* 327*  BUN 52* 68*  CREATININE 6.13* 7.10*  CALCIUM 7.9* 8.0*  MG  --  2.1  PHOS  --  6.7*   Liver Function Tests: Recent Labs    03/20/21 1818  AST 19  ALT 19  ALKPHOS 115  BILITOT 1.1  PROT 6.0*  ALBUMIN 2.6*   No results for input(s): LIPASE, AMYLASE in the last 72 hours. CBC: Recent Labs    03/20/21 1818 03/21/21 0432 03/22/21 0411  WBC 6.2 5.0 5.6  NEUTROABS 4.3  --   --   HGB 8.4* 8.9* 9.0*  HCT 26.6* 27.8* 28.1*  MCV 99.3 99.6 97.9  PLT 146* 151 185   Cardiac Enzymes: No results for input(s): CKTOTAL, CKMB, CKMBINDEX, TROPONINI in the last 72 hours. BNP: Invalid input(s): POCBNP D-Dimer: Recent Labs    03/20/21 1827  DDIMER 2.23*    Hemoglobin A1C: Recent Labs    03/21/21 0432  HGBA1C 7.8*   Fasting Lipid Panel: No results for input(s): CHOL, HDL, LDLCALC, TRIG, CHOLHDL, LDLDIRECT in the last 72 hours. Thyroid Function Tests: No results for input(s): TSH, T4TOTAL, T3FREE, THYROIDAB in the last 72 hours.  Invalid input(s): FREET3 Anemia Panel: No results for input(s): VITAMINB12, FOLATE, FERRITIN, TIBC, IRON, RETICCTPCT in the last 72 hours.  DG Chest 1 View  Result Date: 03/20/2021 CLINICAL DATA:  Chest pain leg fracture EXAM: CHEST  1 VIEW COMPARISON:  02/12/2021 FINDINGS: Cardiomegaly with vascular congestion and mild interstitial edema. No consolidation or pneumothorax IMPRESSION: Cardiomegaly with vascular congestion and mild interstitial edema. Electronically Signed   By: Donavan Foil M.D.   On: 03/20/2021 19:43   DG Tibia/Fibula Left  Result Date: 03/21/2021 CLINICAL DATA:  Surgery.  Tibial fracture EXAM: DG C-ARM 1-60 MIN; LEFT TIBIA AND FIBULA - 2 VIEW FLUOROSCOPY TIME:  Fluoroscopy Time:  1 minutes 59 seconds. Number of Acquired Spot Images: 6 COMPARISON:  March 20, 2021. FINDINGS: Six C-arm fluoroscopic images were obtained intraoperatively and submitted for post operative interpretation. These images demonstrate intramedullary rod and screw fixation of a spiral  fracture of the distal tibial diaphysis. Improved, near anatomic alignment. Fibular neck fracture was better characterized on prior radiographs. Please see the performing provider's procedural report for further detail. IMPRESSION: Intraoperative fluoroscopy, as detailed above. Electronically Signed   By: Margaretha Sheffield MD   On: 03/21/2021 17:23   DG Tibia/Fibula Left  Result Date: 03/20/2021 CLINICAL DATA:  Fall with fracture on "Sunday night.  Re-injured. EXAM: LEFT TIBIA AND FIBULA - 2 VIEW COMPARISON:  Radiograph 03/16/2021 FINDINGS: Displaced spiral fracture of the distal tibial shaft has increased displacement from prior exam. No evidence  of intra-articular extension, no ankle joint effusion. There is both a healed remote is well as acute fracture of the fibular neck. Acute component was not seen on prior. There is posterior splint material in place. Bones diffusely under mineralized. IMPRESSION: 1. Displaced spiral fracture of the distal tibial shaft, increased displacement from prior exam. 2. New acute fracture of the proximal fibular neck. This was not seen on prior exam, there is a chronic healed proximal fibular fracture. Electronically Signed   By: Melanie  Sanford M.D.   On: 03/20/2021 19:26   DG Tibia/Fibula Left Port  Result Date: 03/21/2021 CLINICAL DATA:  Post ORIF. EXAM: PORTABLE LEFT TIBIA AND FIBULA - 2 VIEW COMPARISON:  Intraoperative radiographs earlier the same date and 03/20/2021. FINDINGS: Left tibial intramedullary nail is secured by 2 proximal and 2 distal interlocking screws. The hardware appears well positioned. There is improved alignment of the spiral fracture of the mid to distal tibial diaphysis. Nondisplaced fracture of the proximal fibula appears unchanged. There is underlying posttraumatic deformity of the proximal fibular diaphysis. No complications are identified. Diffuse vascular calcifications are noted. There are skin staples anterior to the patellar tendon. IMPRESSION: Improved alignment of the tibial fracture post ORIF. No demonstrated complication. Electronically Signed   By: William  Veazey M.D.   On: 03/21/2021 17:29   DG C-Arm 1-60 Min  Result Date: 03/21/2021 CLINICAL DATA:  Surgery.  Tibial fracture EXAM: DG C-ARM 1-60 MIN; LEFT TIBIA AND FIBULA - 2 VIEW FLUOROSCOPY TIME:  Fluoroscopy Time:  1 minutes 59 seconds. Number of Acquired Spot Images: 6 COMPARISON:  March 20, 2021. FINDINGS: Six C-arm fluoroscopic images were obtained intraoperatively and submitted for post operative interpretation. These images demonstrate intramedullary rod and screw fixation of a spiral fracture of the distal tibial  diaphysis. Improved, near anatomic alignment. Fibular neck fracture was better characterized on prior radiographs. Please see the performing provider's procedural report for further detail. IMPRESSION: Intraoperative fluoroscopy, as detailed above. Electronically Signed   By: Frederick S Jones MD   On: 03/21/2021 17:23   ECHOCARDIOGRAM COMPLETE  Result Date: 03/21/2021    ECHOCARDIOGRAM REPORT   Patient Name:   Richard Zavala Date of Exam: 03/21/2021 Medical Rec #:  [id number]       Height:       68.0 in Accession #:    [id number]      Weight:       158.3 lb Date of Birth:  02/24/1961       BSA:          1.850 m Patient Age:    60 years        BP:           158/53 mmHg Patient Gender: M               HR:           62"  bpm. Exam Location:  [facility] Procedure: 2D  Echo, Color Doppler, Cardiac Doppler and Strain Analysis Indications:     K53.97 CHF-Acute Systolic  History:         Patient has prior history of Echocardiogram examinations. CHF,                  CAD, TIA and CKD; Risk Factors:Hypertension and Dyslipidemia.  Sonographer:     Charmayne Sheer RDCS (AE) Referring Phys:  [place] Diagnosing Phys: Isaias Cowman MD IMPRESSIONS  1. Left ventricular ejection fraction, by estimation, is 55 to 60%. The left ventricle has normal function. The left ventricle has no regional wall motion abnormalities. Left ventricular diastolic parameters were normal.  2. Right ventricular systolic function is normal. The right ventricular size is normal.  3. The mitral valve is normal in structure. Mild to moderate mitral valve regurgitation. No evidence of mitral stenosis.  4. Tricuspid valve regurgitation is mild to moderate. Mild tricuspid stenosis.  5. The aortic valve is normal in structure. Aortic valve regurgitation is not visualized. Mild to moderate aortic valve sclerosis/calcification is present, without any evidence of aortic stenosis.  6. The inferior vena cava is normal in size with greater than 50% respiratory  variability, suggesting right atrial pressure of 3 mmHg. FINDINGS  Left Ventricle: Left ventricular ejection fraction, by estimation, is 55 to 60%. The left ventricle has normal function. The left ventricle has no regional wall motion abnormalities. The left ventricular internal cavity size was normal in size. There is  no left ventricular hypertrophy. Left ventricular diastolic parameters were normal. Right Ventricle: The right ventricular size is normal. No increase in right ventricular wall thickness. Right ventricular systolic function is normal. Left Atrium: Left atrial size was normal in size. Right Atrium: Right atrial size was normal in size. Pericardium: There is no evidence of pericardial effusion. Mitral Valve: The mitral valve is normal in structure. Mild to moderate mitral valve regurgitation. No evidence of mitral valve stenosis. MV peak gradient, 3.8 mmHg. The mean mitral valve gradient is 2.0 mmHg. Tricuspid Valve: The tricuspid valve is normal in structure. Tricuspid valve regurgitation is mild to moderate. Mild tricuspid stenosis. Aortic Valve: The aortic valve is normal in structure. Aortic valve regurgitation is not visualized. Mild to moderate aortic valve sclerosis/calcification is present, without any evidence of aortic stenosis. Aortic valve mean gradient measures 5.0 mmHg. Aortic valve peak gradient measures 8.6 mmHg. Aortic valve area, by VTI measures 4.23 cm. Pulmonic Valve: The pulmonic valve was normal in structure. Pulmonic valve regurgitation is not visualized. No evidence of pulmonic stenosis. Aorta: The aortic root is normal in size and structure. Venous: The inferior vena cava is normal in size with greater than 50% respiratory variability, suggesting right atrial pressure of 3 mmHg. IAS/Shunts: No atrial level shunt detected by color flow Doppler.  LEFT VENTRICLE PLAX 2D LVIDd:         5.60 cm  Diastology LVIDs:         4.00 cm  LV e' medial:    3.81 cm/s LV PW:         1.60 cm   LV E/e' medial:  26.8 LV IVS:        1.30 cm  LV e' lateral:   6.64 cm/s LVOT diam:     2.50 cm  LV E/e' lateral: 15.4 LV SV:         120 LV SV Index:   65 LVOT Area:     4.91 cm  RIGHT VENTRICLE RV Basal diam:  3.80 cm LEFT ATRIUM             Index       RIGHT ATRIUM           Index LA diam:        4.70 cm 2.54 cm/m  RA Area:     24.00 cm LA Vol (A2C):   68.3 ml 36.91 ml/m RA Volume:   76.20 ml  41.18 ml/m LA Vol (A4C):   64.5 ml 34.86 ml/m LA Biplane Vol: 68.1 ml 36.80 ml/m  AORTIC VALVE                   PULMONIC VALVE AV Area (Vmax):    3.81 cm    PV Vmax:       1.00 m/s AV Area (Vmean):   3.65 cm    PV Vmean:      62.100 cm/s AV Area (VTI):     4.23 cm    PV VTI:        0.178 m AV Vmax:           147.00 cm/s PV Peak grad:  4.0 mmHg AV Vmean:          98.600 cm/s PV Mean grad:  2.0 mmHg AV VTI:            0.283 m AV Peak Grad:      8.6 mmHg AV Mean Grad:      5.0 mmHg LVOT Vmax:         114.00 cm/s LVOT Vmean:        73.400 cm/s LVOT VTI:          0.244 m LVOT/AV VTI ratio: 0.86  AORTA Ao Root diam: 3.60 cm MITRAL VALVE                 TRICUSPID VALVE MV Area (PHT): 4.15 cm      TR Peak grad:   34.3 mmHg MV Area VTI:   3.27 cm      TR Vmax:        293.00 cm/s MV Peak grad:  3.8 mmHg MV Mean grad:  2.0 mmHg      SHUNTS MV Vmax:       0.97 m/s      Systemic VTI:  0.24 m MV Vmean:      69.3 cm/s     Systemic Diam: 2.50 cm MV Decel Time: 183 msec MR Peak grad:    139.7 mmHg MR Mean grad:    81.0 mmHg MR Vmax:         591.00 cm/s MR Vmean:        420.0 cm/s MR PISA:         1.57 cm MR PISA Eff ROA: 8 mm MR PISA Radius:  0.50 cm MV E velocity: 102.00 cm/s MV A velocity: 108.00 cm/s MV E/A ratio:  0.94 Isaias Cowman MD Electronically signed by Isaias Cowman MD Signature Date/Time: 03/21/2021/11:50:59 AM    Final      Echo LVEF 55 to 60%  TELEMETRY:  ASSESSMENT AND PLAN:  Active Problems:   Tibia/fibula fracture, left, closed, with delayed healing, subsequent encounter    1.  Tibial  fracture, status post intramedullary fixation,  without postoperative complication, doing well, denies chest pain or shortness of breath 2.  Mildly elevated high-sensitivity troponin (222, 186, 186 ), without delta, in the absence of chest pain, with normal left ventricular function by 2D echocardiogram, likely due to myocardial injury (  I5A ) in the absence of ischemia in patient with ESRD on chronic hemodialysis 3.  Essential hypertension, blood pressure adequately controlled on current BP medications  Recommendations  1.  Agree with current therapy 2.  Continue carvedilol and amlodipine for essential hypertension 3.  Continue atorvastatin for hyperlipidemia management 4.  Defer further cardiac diagnostics  Sign off for now, please call if any questions    Isaias Cowman, MD, PhD, [facility] 03/22/2021 9:14 AM

## 2021-03-23 DIAGNOSIS — S82402G Unspecified fracture of shaft of left fibula, subsequent encounter for closed fracture with delayed healing: Secondary | ICD-10-CM | POA: Diagnosis not present

## 2021-03-23 DIAGNOSIS — S82202G Unspecified fracture of shaft of left tibia, subsequent encounter for closed fracture with delayed healing: Secondary | ICD-10-CM | POA: Diagnosis not present

## 2021-03-23 LAB — CBC
HCT: 27.1 % — ABNORMAL LOW (ref 39.0–52.0)
Hemoglobin: 8.6 g/dL — ABNORMAL LOW (ref 13.0–17.0)
MCH: 31.3 pg (ref 26.0–34.0)
MCHC: 31.7 g/dL (ref 30.0–36.0)
MCV: 98.5 fL (ref 80.0–100.0)
Platelets: 211 10*3/uL (ref 150–400)
RBC: 2.75 MIL/uL — ABNORMAL LOW (ref 4.22–5.81)
RDW: 19.2 % — ABNORMAL HIGH (ref 11.5–15.5)
WBC: 6.9 10*3/uL (ref 4.0–10.5)
nRBC: 0 % (ref 0.0–0.2)

## 2021-03-23 LAB — BASIC METABOLIC PANEL
Anion gap: 12 (ref 5–15)
BUN: 47 mg/dL — ABNORMAL HIGH (ref 6–20)
CO2: 24 mmol/L (ref 22–32)
Calcium: 7.8 mg/dL — ABNORMAL LOW (ref 8.9–10.3)
Chloride: 100 mmol/L (ref 98–111)
Creatinine, Ser: 5.19 mg/dL — ABNORMAL HIGH (ref 0.61–1.24)
GFR, Estimated: 12 mL/min — ABNORMAL LOW (ref >60)
Glucose, Bld: 128 mg/dL — ABNORMAL HIGH (ref 70–99)
Potassium: 4.5 mmol/L (ref 3.5–5.1)
Sodium: 136 mmol/L (ref 135–145)

## 2021-03-23 LAB — GLUCOSE, CAPILLARY
Glucose-Capillary: 106 mg/dL — ABNORMAL HIGH (ref 70–99)
Glucose-Capillary: 119 mg/dL — ABNORMAL HIGH (ref 70–99)
Glucose-Capillary: 126 mg/dL — ABNORMAL HIGH (ref 70–99)
Glucose-Capillary: 143 mg/dL — ABNORMAL HIGH (ref 70–99)
Glucose-Capillary: 163 mg/dL — ABNORMAL HIGH (ref 70–99)
Glucose-Capillary: 164 mg/dL — ABNORMAL HIGH (ref 70–99)
Glucose-Capillary: 99 mg/dL (ref 70–99)

## 2021-03-23 LAB — MAGNESIUM: Magnesium: 1.9 mg/dL (ref 1.7–2.4)

## 2021-03-23 LAB — PHOSPHORUS: Phosphorus: 5.8 mg/dL — ABNORMAL HIGH (ref 2.5–4.6)

## 2021-03-23 LAB — HEPATITIS B SURFACE ANTIBODY, QUANTITATIVE: Hep B S AB Quant (Post): 23.9 m[IU]/mL (ref >9.9)

## 2021-03-23 NOTE — Evaluation (Signed)
Physical Therapy Evaluation Patient Details Name: Richard Zavala MRN: [id number] DOB: 1961/09/25 Today's Date: 03/23/2021   History of Present Illness  Richard Zavala is a 60yoM who comes to [facility] 3/28 after fall and leg injury, imaging revealing of nondisplaced tibial fracture, DC to home, then [facility] after FU imaging revealing of displaced tibial fracture. Pt went to OR for IM fixation of Left tibial fracture. PMH: chronic Rt foot wound, IDDM2, ESRD on HD, visual impairment, BPH, CAD, CHF, HLD, osteoporosis, remote CVA. Pt is NWB LLE post surgery.  Clinical Impression  Pt admitted with above diagnosis. Pt currently with functional limitations due to the deficits listed below (see "PT Problem List"). Upon entry, pt in bed, awake and agreeable to participate. The pt is alert, pleasant, interactive, and able to provide only minimal info regarding prior level of function. Pt denies any pain this date. Pt made aware of weight bearing status to which he is surprised and frustrated to be on 'lock down'. Pt requires modA to rise to standing with RW due to general weakness made worse by LLE NWB. Pt requires MaxA for pivot transfer. Despite his heavy assist needs, he still asks about walking to BR which seems like fair demonstration of limited insight into his current difficulty. Pt is unable to perform transfers without assist at this time, which would mean being unable to access a toilet if he went home with a WC (son works during the day). A STR stay would offer the best opportunity to improve strength and independence in transfers to prepare for safe return to home. Pt will need a ramp at home at some point to have appropriate access for OP HD services MWF. Patient's performance this date reveals decreased ability, independence, and tolerance in performing all basic mobility required for performance of activities of daily living. Pt requires additional DME, close physical assistance, and  cues for safe participate in mobility. Pt will benefit from skilled PT intervention to increase independence and safety with basic mobility in preparation for discharge to the venue listed below.       Follow Up Recommendations SNF;Supervision for mobility/OOB    Equipment Recommendations  Wheelchair (measurements PT);Wheelchair cushion (measurements PT)    Recommendations for Other Services       Precautions / Restrictions Precautions Precautions: Fall Restrictions Weight Bearing Restrictions: Yes LLE Weight Bearing: Non weight bearing      Mobility  Bed Mobility               General bed mobility comments: at EOB upon arrival    Transfers Overall transfer level: Needs assistance Equipment used: Rolling walker (2 wheeled);1 person hand held assist Transfers: Stand Pivot Transfers;Squat Pivot Transfers   Stand pivot transfers: Mod assist Squat pivot transfers: Mod assist;Max assist     General transfer comment: heavy modA eaith way, struggles with pivot portion even moreso, requires heavy assist for pivot to chair.  Ambulation/Gait Ambulation/Gait assistance:  (unable due to weakness)              Stairs            Wheelchair Mobility    Modified Rankin (Stroke Patients Only)       Balance Overall balance assessment: Needs assistance             Standing balance comment: struggles with maintaining standing and NWB LLE  Pertinent Vitals/Pain Pain Assessment: No/denies pain    Home Living                        Prior Function                 Hand Dominance        Extremity/Trunk Assessment   Upper Extremity Assessment Upper Extremity Assessment: Generalized weakness    Lower Extremity Assessment Lower Extremity Assessment: Generalized weakness       Communication      Cognition Arousal/Alertness: Awake/alert Behavior During Therapy: WFL for tasks  assessed/performed Overall Cognitive Status: No family/caregiver present to determine baseline cognitive functioning                                 General Comments: generally poor insight regarding his impairments, limited safety awareness.      General Comments      Exercises     Assessment/Plan    PT Assessment Patient needs continued PT services  PT Problem List Decreased strength;Decreased range of motion;Decreased activity tolerance;Decreased balance;Decreased mobility;Decreased cognition;Decreased knowledge of use of DME;Decreased safety awareness;Decreased knowledge of precautions;Decreased coordination       PT Treatment Interventions DME instruction;Balance training;Gait training;Stair training;Functional mobility training;Therapeutic activities;Therapeutic exercise;Wheelchair mobility training;Patient/family education    PT Goals (Current goals can be found in the Care Plan section)  Acute Rehab PT Goals Patient Stated Goal: wants to be able to AMB to BR ad lib PT Goal Formulation: With patient Time For Goal Achievement: 04/06/21 Potential to Achieve Goals: Poor    Frequency 7X/week   Barriers to discharge Inaccessible home environment;Decreased caregiver support home alone during the day    Co-evaluation               AM-PAC PT "6 Clicks" Mobility  Outcome Measure Help needed turning from your back to your side while in a flat bed without using bedrails?: A Lot Help needed moving from lying on your back to sitting on the side of a flat bed without using bedrails?: A Lot Help needed moving to and from a bed to a chair (including a wheelchair)?: Total Help needed standing up from a chair using your arms (e.g., wheelchair or bedside chair)?: A Lot Help needed to walk in hospital room?: Total Help needed climbing 3-5 steps with a railing? : Total 6 Click Score: 9    End of Session Equipment Utilized During Treatment: Gait belt Activity  Tolerance: Patient tolerated treatment well;No increased pain Patient left: in chair;with call bell/phone within reach;with chair alarm set;with family/visitor present Nurse Communication: Mobility status PT Visit Diagnosis: Unsteadiness on feet (R26.81);Difficulty in walking, not elsewhere classified (R26.2);Other abnormalities of gait and mobility (R26.89);Muscle weakness (generalized) (M62.81)    Time: 1638-4536 PT Time Calculation (min) (ACUTE ONLY): 28 min   Charges:   PT Evaluation $PT Eval Moderate Complexity: 1 Mod PT Treatments $Gait Training: 8-22 mins        1:47 PM, 03/23/21 Etta Grandchild, PT, DPT Physical Therapist - [facility]  [phone] ([place])    Whitley C 03/23/2021, 1:36 PM

## 2021-03-23 NOTE — TOC Initial Note (Addendum)
Transition of Care [facility]) - Initial/Assessment Note    Patient Details  Name: Richard Zavala MRN: [id number] Date of Birth: [birth year]  [facility]) CM/SW Contact:    Magnus Ivan, LCSW Phone Number: 03/23/2021, 12:48 PM  Clinical Narrative:           Patient is high risk for readmission. CSW spoke with patient for assessment. Patient lives with his son. Children provide transportation. PCP is [facility]. Pharmacy is [facility]. Patient has a RW (from 2012, said he would like a new one if recommended), shower chair, and home o2. Patient said his o2 was through [facility] and he got it in November 2021, but has never used it. Patient goes to Dialysis Monday, Wednesday, Friday. PT eval pending.  1:55- PT recommends SNF. Spoke to patient who says he wants to think about it, may want to go home with Home Health. He is agreeable to CSW starting SNF work up. Patient has not had his COVID vaccines. Will need to start auth when close to being medically ready, CSW asked MD for estimate.  2:30- Per MD patient is medically ready for SNF when bed is available. No bed offers yet. CSW called [facility], spoke with [place]. Started SNF auth and faxed clinicals. Navi will need to be updated when SNF is chosen if patient agrees to go to SNF. Auth reference # [id number].  Expected Discharge Plan: Home/Self Care Barriers to Discharge: Continued Medical Work up   Patient Goals and CMS Choice Patient states their goals for this hospitalization and ongoing recovery are:: to return home CMS Medicare.gov Compare Post Acute Care list provided to:: Patient Choice offered to / list presented to : Patient  Expected Discharge Plan and Services Expected Discharge Plan: Home/Self Care       Living arrangements for the past 2 months: Single Family Home                                      Prior Living Arrangements/Services Living arrangements for the past 2 months: Single  Family Home Lives with:: Adult Children Patient language and need for interpreter reviewed:: Yes Do you feel safe going back to the place where you live?: Yes      Need for Family Participation in Patient Care: Yes (Comment) Care giver support system in place?: Yes (comment) Current home services: DME Criminal Activity/Legal Involvement Pertinent to Current Situation/Hospitalization: No - Comment as needed  Activities of Daily Living Home Assistive Devices/Equipment: None ADL Screening (condition at time of admission) Patient's cognitive ability adequate to safely complete daily activities?: Yes Is the patient deaf or have difficulty hearing?: No Does the patient have difficulty seeing, even when wearing glasses/contacts?: No Does the patient have difficulty concentrating, remembering, or making decisions?: No Patient able to express need for assistance with ADLs?: Yes Does the patient have difficulty dressing or bathing?: Yes Independently performs ADLs?: No Does the patient have difficulty walking or climbing stairs?: Yes Weakness of Legs: Both Weakness of Arms/Hands: None  Permission Sought/Granted Permission sought to share information with : Facility Art therapist granted to share information with : Yes, Verbal Permission Granted     Permission granted to share info w AGENCY: HH, DME, as needed        Emotional Assessment       Orientation: : Oriented to Self,Oriented to Place,Oriented to  Time,Oriented to Situation  Alcohol / Substance Use: Not Applicable Psych Involvement: No (comment)  Admission diagnosis:  ESRD (end stage renal disease) ([place]) [N18.6] Chest pain [R07.9] Tibia/fibula fracture, left, closed, initial encounter [S82.202A, S82.402A] Tibia/fibula fracture, left, closed, with delayed healing, subsequent encounter [S82.202G, S82.402G] Closed fracture of distal end of left tibia, unspecified fracture morphology, initial encounter  [S82.302A] Patient Active Problem List   Diagnosis Date Noted  . Tibia/fibula fracture, left, closed, with delayed healing, subsequent encounter 03/20/2021  . Sepsis ([place]) 02/12/2021  . Acute metabolic encephalopathy 70/96/2836  . Acute respiratory failure with hypoxia ([place]) 02/12/2021  . Anemia in ESRD (end-stage renal disease) ([place]) 02/12/2021  . HLD (hyperlipidemia) 02/12/2021  . Stroke ([place]) 02/12/2021  . Hypothermia 02/12/2021  . CAP (community acquired pneumonia) 02/12/2021  . ESRD on hemodialysis ([place])   . Acute cystitis 02/06/2021  . Acute upper GI bleed 02/06/2021  . Acute blood loss anemia 02/06/2021  . COVID-19 virus infection 02/06/2021  . Gait instability 10/12/2019  . Pulmonary nodule 10/12/2019  . CAD (coronary artery disease), native coronary artery 03/08/2018  . Hyperlipidemia 03/08/2018  . Essential hypertension 03/08/2018  . Wilms' tumor with favorable history 03/01/2018  . Closed fracture of right distal radius 08/22/2016  . TIA (transient ischemic attack) 08/21/2016  . Encounter for pre-transplant evaluation for kidney transplant 05/17/2016  . Anemia due to other cause   . Hyperkalemia   . Hypoglycemia   . Pain   . End stage renal disease ([place])   . Weakness   . Other chest pain   . Elevated troponin   . Hyperkalemia, diminished renal excretion 08/14/2015  . Hypertensive urgency 03/26/2015  . Anemia in CKD (chronic kidney disease) 12/04/2014  . Chronic kidney disease, stage IV (severe) ([place]) 12/03/2014  . Acute ischemic stroke ([place]) 08/25/2014  . Acute lacunar stroke ([place]) 06/25/2014  . Orthostatic hypotension 06/25/2014  . Diabetic vitreous hemorrhage associated with type 2 diabetes mellitus ([place]) 09/28/2013  . PDR (proliferative diabetic retinopathy) ([place]) 09/28/2013  . History of Wilms' tumor 08/15/2013  . Iron deficiency anemia 07/21/2013  . Mesenteric artery stenosis ([place]) 03/27/2013  . Heart failure, chronic systolic ([place]) 62/94/7654  .  Ischemic cardiomyopathy 03/13/2013  . Pulmonary HTN ([place]) 03/13/2013  . Peripheral edema 02/28/2013  . Diabetic macular edema of both eyes ([place]) 01/16/2013  . Corneal epithelial defect 10/06/2012  . TRD (traction retinal detachment) 09/22/2012  . Vitreous hemorrhage of both eyes ([place]) 07/21/2012  . Closed right hip fracture ([place]) 02/19/2012  . Diabetic sensorimotor polyneuropathy ([place]) 01/27/2012  . Vitamin D deficiency disease 11/30/2011  . Type II diabetes mellitus with renal manifestations ([place]) 07/03/2011  . Type 2 diabetes mellitus with kidney complication, with long-term current use of insulin ([place]) 07/03/2011  . Osteoporosis with fracture 05/28/2011   PCP:  Midge Minium, PA Pharmacy:   [facility] [address] ([address]), [place] [address] [place]) [place] [address] Phone: [phone] Fax: [phone]     Social Determinants of Health (SDOH) Interventions    Readmission Risk Interventions Readmission Risk Prevention Plan 02/14/2021 02/09/2021 02/08/2021  Transportation Screening Complete - Complete  PCP or Specialist Appt within 3-5 Days - - Complete  HRI or Home Care Consult - - Patient refused  Social Work Consult for [place] Planning/Counseling - Patient refused Patient refused  Palliative Care Screening - Patient Refused Patient Refused  Palliative Care Screening Not Complete Comments - (No Data) -  Medication Review (RN Care Manager) Complete Referral to Pharmacy  Referral to Pharmacy  PCP or Specialist appointment within 3-5 days of discharge Complete - -  [place] or [place] Patient refused - -  SW Recovery Care/Counseling Consult Patient refused - -  Palliative Care Screening Not Applicable - -  [place] Not Applicable - -  Some recent data might be hidden

## 2021-03-23 NOTE — Plan of Care (Signed)
°  Problem: Coping: °Goal: Level of anxiety will decrease °Outcome: Progressing °  °

## 2021-03-23 NOTE — Progress Notes (Signed)
[place], [place] 03/23/21  Subjective:   LOS: 3  Richard Zavala is a 60 y.o. male and established patient of [facility]. He has a past medical history of ESRD on hemodialysis, anemia, diabetes, CAD, CHF, pulmonary hypertension and osteoporosis. He presents to the ED with Tibia/fibula fracture and will undergo surgery later today.  Patient is seen sitting in the chair Alert and oriented Tolerating meals without nausea Denies shortness of breath   Objective:  Vital signs in last 24 hours:  Temp:  [97.5 F (36.4 C)-98.3 F (36.8 C)] 98.1 F (36.7 C) (04/03 0739) Pulse Rate:  [55-62] 55 (04/03 0739) Resp:  [15-18] 15 (04/03 0739) BP: (83-137)/(50-83) 105/70 (04/03 0739) SpO2:  [92 %-100 %] 94 % (04/03 0739)  Weight change:  There were no vitals filed for this visit.  Intake/Output:    Intake/Output Summary (Last 24 hours) at 03/23/2021 0948 Last data filed at 03/22/2021 2353 Gross per 24 hour  Intake --  Output 2000 ml  Net -2000 ml     Physical Exam: General: NAD, laying in bed  HEENT Normocephalic, moist oral mucosa  Pulm/lungs Clear sounds, regular breathing pattern  CVS/Heart S1S2 present, regular  Abdomen:  Soft, non tender, nondistended  Extremities: Min edema, left LE surgical dressing  Neurologic: Alert, oriented, able to answer questions  Skin: No rashes or masses  Access: Rt lower arm AVF       Basic Metabolic Panel:  Recent Labs  Lab 03/20/21 1818 03/21/21 0432 03/22/21 0411 03/23/21 0434  NA 137 136 136 136  K 3.8 4.0 5.2* 4.5  CL 102 103 103 100  CO2 22 22 20* 24  GLUCOSE 222* 269* 327* 128*  BUN 47* 52* 68* 47*  CREATININE 5.71* 6.13* 7.10* 5.19*  CALCIUM 8.0* 7.9* 8.0* 7.8*  MG  --   --  2.1 1.9  PHOS  --   --  6.7* 5.8*     CBC: Recent Labs  Lab 03/20/21 1818 03/21/21 0432 03/22/21 0411 03/23/21 0434  WBC 6.2 5.0 5.6 6.9  NEUTROABS 4.3  --   --   --   HGB 8.4* 8.9* 9.0* 8.6*  HCT 26.6* 27.8* 28.1*  27.1*  MCV 99.3 99.6 97.9 98.5  PLT 146* 151 185 211      Lab Results  Component Value Date   HEPBSAG NON REACTIVE 03/21/2021   HEPBSAB Non Reactive 08/14/2015      Microbiology:  Recent Results (from the past 240 hour(s))  Resp Panel by RT-PCR (Flu A&B, Covid) Nasopharyngeal Swab     Status: None   Collection Time: 03/20/21  6:17 PM   Specimen: Nasopharyngeal Swab; Nasopharyngeal(NP) swabs in vial transport medium  Result Value Ref Range Status   SARS Coronavirus 2 by RT PCR NEGATIVE NEGATIVE Final    Comment: (NOTE) SARS-CoV-2 target nucleic acids are NOT DETECTED.  The SARS-CoV-2 RNA is generally detectable in upper respiratory specimens during the acute phase of infection. The lowest concentration of SARS-CoV-2 viral copies this assay can detect is 138 copies/mL. A negative result does not preclude SARS-Cov-2 infection and should not be used as the sole basis for treatment or other patient management decisions. A negative result may occur with  improper specimen collection/handling, submission of specimen other than nasopharyngeal swab, presence of viral mutation(s) within the areas targeted by this assay, and inadequate number of viral copies(<138 copies/mL). A negative result must be combined with clinical observations, patient history, and epidemiological information. The expected result is Negative.  Fact Sheet for Patients:  EntrepreneurPulse.com.au  Fact Sheet for Healthcare Providers:  IncredibleEmployment.be  This test is no t yet approved or cleared by the [place] FDA and  has been authorized for detection and/or diagnosis of SARS-CoV-2 by FDA under an Emergency Use Authorization (EUA). This EUA will remain  in effect (meaning this test can be used) for the duration of the COVID-19 declaration under Section 564(b)(1) of the Act, 21 U.S.C.section 360bbb-3(b)(1), unless the authorization is terminated  or revoked  sooner.       Influenza A by PCR NEGATIVE NEGATIVE Final   Influenza B by PCR NEGATIVE NEGATIVE Final    Comment: (NOTE) The Xpert Xpress SARS-CoV-2/FLU/RSV plus assay is intended as an aid in the diagnosis of influenza from Nasopharyngeal swab specimens and should not be used as a sole basis for treatment. Nasal washings and aspirates are unacceptable for Xpert Xpress SARS-CoV-2/FLU/RSV testing.  Fact Sheet for Patients: EntrepreneurPulse.com.au  Fact Sheet for Healthcare Providers: IncredibleEmployment.be  This test is not yet approved or cleared by the [place] FDA and has been authorized for detection and/or diagnosis of SARS-CoV-2 by FDA under an Emergency Use Authorization (EUA). This EUA will remain in effect (meaning this test can be used) for the duration of the COVID-19 declaration under Section 564(b)(1) of the Act, 21 U.S.C. section 360bbb-3(b)(1), unless the authorization is terminated or revoked.  Performed at [facility], [address] [place], [place] [address]     Coagulation Studies: Recent Labs    03/20/21 1818  LABPROT 16.4*  INR 1.4*    Urinalysis: No results for input(s): COLORURINE, LABSPEC, PHURINE, GLUCOSEU, HGBUR, BILIRUBINUR, KETONESUR, PROTEINUR, UROBILINOGEN, NITRITE, LEUKOCYTESUR in the last 72 hours.  Invalid input(s): APPERANCEUR    Imaging: DG Tibia/Fibula Left  Result Date: 03/21/2021 CLINICAL DATA:  Surgery.  Tibial fracture EXAM: DG C-ARM 1-60 MIN; LEFT TIBIA AND FIBULA - 2 VIEW FLUOROSCOPY TIME:  Fluoroscopy Time:  1 minutes 59 seconds. Number of Acquired Spot Images: 6 COMPARISON:  March 20, 2021. FINDINGS: Six C-arm fluoroscopic images were obtained intraoperatively and submitted for post operative interpretation. These images demonstrate intramedullary rod and screw fixation of a spiral fracture of the distal tibial diaphysis. Improved, near anatomic alignment. Fibular neck  fracture was better characterized on prior radiographs. Please see the performing provider's procedural report for further detail. IMPRESSION: Intraoperative fluoroscopy, as detailed above. Electronically Signed   By: Margaretha Sheffield MD   On: 03/21/2021 17:23   DG Tibia/Fibula Left Port  Result Date: 03/21/2021 CLINICAL DATA:  Post ORIF. EXAM: PORTABLE LEFT TIBIA AND FIBULA - 2 VIEW COMPARISON:  Intraoperative radiographs earlier the same date and 03/20/2021. FINDINGS: Left tibial intramedullary nail is secured by 2 proximal and 2 distal interlocking screws. The hardware appears well positioned. There is improved alignment of the spiral fracture of the mid to distal tibial diaphysis. Nondisplaced fracture of the proximal fibula appears unchanged. There is underlying posttraumatic deformity of the proximal fibular diaphysis. No complications are identified. Diffuse vascular calcifications are noted. There are skin staples anterior to the patellar tendon. IMPRESSION: Improved alignment of the tibial fracture post ORIF. No demonstrated complication. Electronically Signed   By: Richardean Sale M.D.   On: 03/21/2021 17:29   DG C-Arm 1-60 Min  Result Date: 03/21/2021 CLINICAL DATA:  Surgery.  Tibial fracture EXAM: DG C-ARM 1-60 MIN; LEFT TIBIA AND FIBULA - 2 VIEW FLUOROSCOPY TIME:  Fluoroscopy Time:  1 minutes 59 seconds. Number of Acquired Spot Images: 6 COMPARISON:  March 20, 2021. FINDINGS: Six C-arm fluoroscopic images were obtained intraoperatively and submitted for post operative interpretation. These images demonstrate intramedullary rod and screw fixation of a spiral fracture of the distal tibial diaphysis. Improved, near anatomic alignment. Fibular neck fracture was better characterized on prior radiographs. Please see the performing provider's procedural report for further detail. IMPRESSION: Intraoperative fluoroscopy, as detailed above. Electronically Signed   By: Margaretha Sheffield MD   On: 03/21/2021  17:23   ECHOCARDIOGRAM COMPLETE  Result Date: 03/21/2021    ECHOCARDIOGRAM REPORT   Patient Name:   NOBORU BIDINGER Date of Exam: 03/21/2021 Medical Rec #:  [id number]       Height:       68.0 in Accession #:    [id number]      Weight:       158.3 lb Date of Birth:  [birth year]       BSA:          1.850 m Patient Age:    45 years        BP:           158/53 mmHg Patient Gender: M               HR:           62 bpm. Exam Location:  [facility] Procedure: 2D Echo, Color Doppler, Cardiac Doppler and Strain Analysis Indications:     I14.43 CHF-Acute Systolic  History:         Patient has prior history of Echocardiogram examinations. CHF,                  CAD, TIA and CKD; Risk Factors:Hypertension and Dyslipidemia.  Sonographer:     Charmayne Sheer RDCS (AE) Referring Phys:  [place] Diagnosing Phys: Isaias Cowman MD IMPRESSIONS  1. Left ventricular ejection fraction, by estimation, is 55 to 60%. The left ventricle has normal function. The left ventricle has no regional wall motion abnormalities. Left ventricular diastolic parameters were normal.  2. Right ventricular systolic function is normal. The right ventricular size is normal.  3. The mitral valve is normal in structure. Mild to moderate mitral valve regurgitation. No evidence of mitral stenosis.  4. Tricuspid valve regurgitation is mild to moderate. Mild tricuspid stenosis.  5. The aortic valve is normal in structure. Aortic valve regurgitation is not visualized. Mild to moderate aortic valve sclerosis/calcification is present, without any evidence of aortic stenosis.  6. The inferior vena cava is normal in size with greater than 50% respiratory variability, suggesting right atrial pressure of 3 mmHg. FINDINGS  Left Ventricle: Left ventricular ejection fraction, by estimation, is 55 to 60%. The left ventricle has normal function. The left ventricle has no regional wall motion abnormalities. The left ventricular internal cavity size was normal in size. There  is  no left ventricular hypertrophy. Left ventricular diastolic parameters were normal. Right Ventricle: The right ventricular size is normal. No increase in right ventricular wall thickness. Right ventricular systolic function is normal. Left Atrium: Left atrial size was normal in size. Right Atrium: Right atrial size was normal in size. Pericardium: There is no evidence of pericardial effusion. Mitral Valve: The mitral valve is normal in structure. Mild to moderate mitral valve regurgitation. No evidence of mitral valve stenosis. MV peak gradient, 3.8 mmHg. The mean mitral valve gradient is 2.0 mmHg. Tricuspid Valve: The tricuspid valve is normal in structure. Tricuspid valve regurgitation is mild to moderate. Mild tricuspid stenosis. Aortic Valve: The aortic  valve is normal in structure. Aortic valve regurgitation is not visualized. Mild to moderate aortic valve sclerosis/calcification is present, without any evidence of aortic stenosis. Aortic valve mean gradient measures 5.0 mmHg. Aortic valve peak gradient measures 8.6 mmHg. Aortic valve area, by VTI measures 4.23 cm. Pulmonic Valve: The pulmonic valve was normal in structure. Pulmonic valve regurgitation is not visualized. No evidence of pulmonic stenosis. Aorta: The aortic root is normal in size and structure. Venous: The inferior vena cava is normal in size with greater than 50% respiratory variability, suggesting right atrial pressure of 3 mmHg. IAS/Shunts: No atrial level shunt detected by color flow Doppler.  LEFT VENTRICLE PLAX 2D LVIDd:         5.60 cm  Diastology LVIDs:         4.00 cm  LV e' medial:    3.81 cm/s LV PW:         1.60 cm  LV E/e' medial:  26.8 LV IVS:        1.30 cm  LV e' lateral:   6.64 cm/s LVOT diam:     2.50 cm  LV E/e' lateral: 15.4 LV SV:         120 LV SV Index:   65 LVOT Area:     4.91 cm  RIGHT VENTRICLE RV Basal diam:  3.80 cm LEFT ATRIUM             Index       RIGHT ATRIUM           Index LA diam:        4.70 cm 2.54  cm/m  RA Area:     24.00 cm LA Vol (A2C):   68.3 ml 36.91 ml/m RA Volume:   76.20 ml  41.18 ml/m LA Vol (A4C):   64.5 ml 34.86 ml/m LA Biplane Vol: 68.1 ml 36.80 ml/m  AORTIC VALVE                   PULMONIC VALVE AV Area (Vmax):    3.81 cm    PV Vmax:       1.00 m/s AV Area (Vmean):   3.65 cm    PV Vmean:      62.100 cm/s AV Area (VTI):     4.23 cm    PV VTI:        0.178 m AV Vmax:           147.00 cm/s PV Peak grad:  4.0 mmHg AV Vmean:          98.600 cm/s PV Mean grad:  2.0 mmHg AV VTI:            0.283 m AV Peak Grad:      8.6 mmHg AV Mean Grad:      5.0 mmHg LVOT Vmax:         114.00 cm/s LVOT Vmean:        73.400 cm/s LVOT VTI:          0.244 m LVOT/AV VTI ratio: 0.86  AORTA Ao Root diam: 3.60 cm MITRAL VALVE                 TRICUSPID VALVE MV Area (PHT): 4.15 cm      TR Peak grad:   34.3 mmHg MV Area VTI:   3.27 cm      TR Vmax:        293.00 cm/s MV Peak grad:  3.8 mmHg MV Mean grad:  2.0  mmHg      SHUNTS MV Vmax:       0.97 m/s      Systemic VTI:  0.24 m MV Vmean:      69.3 cm/s     Systemic Diam: 2.50 cm MV Decel Time: 183 msec MR Peak grad:    139.7 mmHg MR Mean grad:    81.0 mmHg MR Vmax:         591.00 cm/s MR Vmean:        420.0 cm/s MR PISA:         1.57 cm MR PISA Eff ROA: 8 mm MR PISA Radius:  0.50 cm MV E velocity: 102.00 cm/s MV A velocity: 108.00 cm/s MV E/A ratio:  0.94 Isaias Cowman MD Electronically signed by Isaias Cowman MD Signature Date/Time: 03/21/2021/11:50:59 AM    Final      Medications:   . sodium chloride 75 mL/hr at 03/21/21 2316  . methocarbamol (ROBAXIN) IV     . amLODipine  5 mg Oral Daily  . atorvastatin  80 mg Oral QHS  . carvedilol  3.125 mg Oral BID  . docusate sodium  100 mg Oral BID  . epoetin (EPOGEN/PROCRIT) injection  10,000 Units Intravenous Q M,W,F-HD  . heparin injection (subcutaneous)  5,000 Units Subcutaneous Q8H  . insulin aspart  0-15 Units Subcutaneous Q4H  . insulin glargine  10 Units Subcutaneous QHS  . pantoprazole  (PROTONIX) IV  40 mg Intravenous Q12H  . senna  1 tablet Oral BID  . traMADol  50 mg Oral Q6H   acetaminophen, albuterol, alum & mag hydroxide-simeth, bisacodyl, bisacodyl, hydrALAZINE, HYDROmorphone (DILAUDID) injection, magnesium citrate, menthol-cetylpyridinium **OR** phenol, methocarbamol **OR** methocarbamol (ROBAXIN) IV, ondansetron **OR** ondansetron (ZOFRAN) IV, oxyCODONE, oxyCODONE, polyethylene glycol  Assessment/ Plan:  60 y.o. male with  was admitted on 03/20/2021 for  Active Problems:   Tibia/fibula fracture, left, closed, with delayed healing, subsequent encounter  ESRD (end stage renal disease) ([place]) [N18.6] Chest pain [R07.9] Tibia/fibula fracture, left, closed, initial encounter [S82.202A, S82.402A] Tibia/fibula fracture, left, closed, with delayed healing, subsequent encounter [S82.202G, S82.402G] Closed fracture of distal end of left tibia, unspecified fracture morphology, initial encounter [S82.302A]  [facility] [place]/MWF/ Rt lower AVF/ 72kg  #. ESRD on HD - Dialysis scheduled today - UF goal 2L - Next treatment will be Monday - PT requesting afternoon treatment to allow therapy in the mornings  #. Anemia of CKD  Lab Results  Component Value Date   HGB 8.6 (L) 03/23/2021  EPO given outpatient EPO 10000 units with HD  #. Secondary hyperparathyroidism of renal origin N 25.81   No results found for: PTH Lab Results  Component Value Date   PHOS 5.8 (H) 03/23/2021  Elevated levels, will consider binders Monitor calcium and phos level during this admission   #. Diabetes type 2 with CKD Hemoglobin A1C (%)  Date Value  10/28/2012 8.3 (H)   Hgb A1c MFr Bld (%)  Date Value  03/21/2021 7.8 (H)  Monitoring   # Left spiral tibial shaft fracture - surgery performed on 03/21/21 - surgical dressing in place   LOS: [place] 4/3/20229:48 [place], [place]

## 2021-03-23 NOTE — NC FL2 (Addendum)
[place] LEVEL OF CARE SCREENING TOOL     IDENTIFICATION  Patient Name: Richard Zavala Birthdate: 1961-05-20 Sex: male Admission Date (Current Location): 03/20/2021  [place] and [place] Number:  Engineering geologist and Address:  [facility], [address] [place], [place] [address]      Provider Number: [id number]  Attending Physician Name and Address:  Thornton Park, MD  Relative Name and Phone Number:  [place], [place])   [phone] [facility] Phone)    Current Level of Care: Hospital Recommended Level of Care: [place] Prior Approval Number:    Date Approved/Denied:   PASRR Number: [id number]  Discharge Plan:      Current Diagnoses: Patient Active Problem List   Diagnosis Date Noted  . Tibia/fibula fracture, left, closed, with delayed healing, subsequent encounter 03/20/2021  . Sepsis ([place]) 02/12/2021  . Acute metabolic encephalopathy 85/46/2703  . Acute respiratory failure with hypoxia ([place]) 02/12/2021  . Anemia in ESRD (end-stage renal disease) ([place]) 02/12/2021  . HLD (hyperlipidemia) 02/12/2021  . Stroke ([place]) 02/12/2021  . Hypothermia 02/12/2021  . CAP (community acquired pneumonia) 02/12/2021  . ESRD on hemodialysis ([place])   . Acute cystitis 02/06/2021  . Acute upper GI bleed 02/06/2021  . Acute blood loss anemia 02/06/2021  . COVID-19 virus infection 02/06/2021  . Gait instability 10/12/2019  . Pulmonary nodule 10/12/2019  . CAD (coronary artery disease), native coronary artery 03/08/2018  . Hyperlipidemia 03/08/2018  . Essential hypertension 03/08/2018  . Wilms' tumor with favorable history 03/01/2018  . Closed fracture of right distal radius 08/22/2016  . TIA (transient ischemic attack) 08/21/2016  . Encounter for pre-transplant evaluation for kidney transplant 05/17/2016  . Anemia due to other cause   . Hyperkalemia   . Hypoglycemia   . Pain   . End stage renal disease ([place])   .  Weakness   . Other chest pain   . Elevated troponin   . Hyperkalemia, diminished renal excretion 08/14/2015  . Hypertensive urgency 03/26/2015  . Anemia in CKD (chronic kidney disease) 12/04/2014  . Chronic kidney disease, stage IV (severe) ([place]) 12/03/2014  . Acute ischemic stroke ([place]) 08/25/2014  . Acute lacunar stroke ([place]) 06/25/2014  . Orthostatic hypotension 06/25/2014  . Diabetic vitreous hemorrhage associated with type 2 diabetes mellitus ([place]) 09/28/2013  . PDR (proliferative diabetic retinopathy) ([place]) 09/28/2013  . History of Wilms' tumor 08/15/2013  . Iron deficiency anemia 07/21/2013  . Mesenteric artery stenosis ([place]) 03/27/2013  . Heart failure, chronic systolic ([place]) 50/08/3817  . Ischemic cardiomyopathy 03/13/2013  . Pulmonary HTN ([place]) 03/13/2013  . Peripheral edema 02/28/2013  . Diabetic macular edema of both eyes ([place]) 01/16/2013  . Corneal epithelial defect 10/06/2012  . TRD (traction retinal detachment) 09/22/2012  . Vitreous hemorrhage of both eyes ([place]) 07/21/2012  . Closed right hip fracture ([place]) 02/19/2012  . Diabetic sensorimotor polyneuropathy ([place]) 01/27/2012  . Vitamin D deficiency disease 11/30/2011  . Type II diabetes mellitus with renal manifestations ([place]) 07/03/2011  . Type 2 diabetes mellitus with kidney complication, with long-term current use of insulin ([place]) 07/03/2011  . Osteoporosis with fracture 05/28/2011    Orientation RESPIRATION BLADDER Height & Weight     Self,Time,Situation,Place  Normal Continent Weight:   Height:  5\' 8"  (172.7 cm)  BEHAVIORAL SYMPTOMS/MOOD NEUROLOGICAL BOWEL NUTRITION STATUS      Continent Diet (regular diet, thin liquids)  AMBULATORY STATUS COMMUNICATION OF NEEDS Skin   Extensive Assist Verbally Surgical wounds (L leg surgical incision)  Personal Care Assistance Level of Assistance  Bathing,Feeding,Dressing Bathing Assistance: Maximum assistance Feeding assistance:  Independent Dressing Assistance: Maximum assistance     Functional Limitations Info             SPECIAL CARE FACTORS FREQUENCY  PT (By licensed PT),OT (By licensed OT)     PT Frequency: 5 x/wee OT Frequency: 5 x/week            Contractures      Additional Factors Info  Code Status,Allergies Code Status Info: full code Allergies Info: metformin, penicillins           Current Medications (03/23/2021):  This is the current hospital active medication list Current Facility-Administered Medications  Medication Dose Route Frequency Provider Last Rate Last Admin  . 0.9 %  sodium chloride infusion   Intravenous Continuous Thornton Park, MD 75 mL/hr at 03/21/21 2316 New Bag at 03/21/21 2316  . acetaminophen (TYLENOL) tablet 325-650 mg  325-650 mg Oral Q6H PRN Thornton Park, MD   650 mg at 03/23/21 0957  . albuterol (VENTOLIN HFA) 108 (90 Base) MCG/ACT inhaler 2 puff  2 puff Inhalation Q6H PRN Thornton Park, MD      . alum & mag hydroxide-simeth (MAALOX/MYLANTA) 200-200-20 MG/5ML suspension 30 mL  30 mL Oral Q4H PRN Thornton Park, MD      . atorvastatin (LIPITOR) tablet 80 mg  80 mg Oral QHS Thornton Park, MD   80 mg at 03/22/21 2105  . bisacodyl (DULCOLAX) EC tablet 5 mg  5 mg Oral Daily PRN Thornton Park, MD      . bisacodyl (DULCOLAX) suppository 10 mg  10 mg Rectal Daily PRN Thornton Park, MD      . carvedilol (COREG) tablet 3.125 mg  3.125 mg Oral BID Val Riles, MD   3.125 mg at 03/23/21 0957  . docusate sodium (COLACE) capsule 100 mg  100 mg Oral BID Thornton Park, MD   100 mg at 03/23/21 1242  . epoetin alfa (EPOGEN) injection 10,000 Units  10,000 Units Intravenous Q M,W,F-HD Thornton Park, MD      . heparin injection 5,000 Units  5,000 Units Subcutaneous Q8H Thornton Park, MD   5,000 Units at 03/23/21 [phone]  . hydrALAZINE (APRESOLINE) injection 10 mg  10 mg Intravenous Q6H PRN Thornton Park, MD      . HYDROmorphone (DILAUDID) injection  0.5-1 mg  0.5-1 mg Intravenous Q4H PRN Thornton Park, MD      . insulin aspart (novoLOG) injection 0-15 Units  0-15 Units Subcutaneous Q4H Thornton Park, MD   8 Units at 03/22/21 2106  . insulin glargine (LANTUS) injection 10 Units  10 Units Subcutaneous QHS Val Riles, MD   10 Units at 03/22/21 2111  . magnesium citrate solution 1 Bottle  1 Bottle Oral Once PRN Thornton Park, MD      . menthol-cetylpyridinium (CEPACOL) lozenge 3 mg  1 lozenge Oral PRN Thornton Park, MD       Or  . phenol (CHLORASEPTIC) mouth spray 1 spray  1 spray Mouth/Throat PRN Thornton Park, MD      . methocarbamol (ROBAXIN) tablet 500 mg  500 mg Oral Q6H PRN Thornton Park, MD       Or  . methocarbamol (ROBAXIN) 500 mg in dextrose 5 % 50 mL IVPB  500 mg Intravenous Q6H PRN Thornton Park, MD      . ondansetron [facility]) tablet 4 mg  4 mg Oral Q6H PRN Thornton Park, MD       Or  .  ondansetron (ZOFRAN) injection 4 mg  4 mg Intravenous Q6H PRN Thornton Park, MD      . oxyCODONE (Oxy IR/ROXICODONE) immediate release tablet 10-15 mg  10-15 mg Oral Q4H PRN Thornton Park, MD      . oxyCODONE (Oxy IR/ROXICODONE) immediate release tablet 5-10 mg  5-10 mg Oral Q4H PRN Thornton Park, MD      . pantoprazole (PROTONIX) injection 40 mg  40 mg Intravenous Q12H Thornton Park, MD   40 mg at 03/23/21 0957  . polyethylene glycol (MIRALAX / GLYCOLAX) packet 17 g  17 g Oral Daily PRN Thornton Park, MD      . senna (SENOKOT) tablet 8.6 mg  1 tablet Oral BID Thornton Park, MD   8.6 mg at 03/23/21 1242  . traMADol (ULTRAM) tablet 50 mg  50 mg Oral Q6H Thornton Park, MD   50 mg at 03/23/21 1242     Discharge Medications: Please see discharge summary for a list of discharge medications.  Relevant Imaging Results:  Relevant Lab Results:   Additional Information SS #: [id number]  [place], LCSW

## 2021-03-23 NOTE — Progress Notes (Signed)
[facility] Progress Note  Patient: Richard Zavala    RAQ:[id number]  DOA: 03/20/2021     Date of Service: the patient was seen and examined on 03/23/2021  Chief Complaint  Patient presents with  . Leg Injury   Brief hospital course: Pt is 60 y/o male seen in ED as consult for left tibia #, that has gotten worse. Pt states he fell on left side from what It sounds like he fell off his bed.  Today pt is alert,awake and reports chest pain since yesterday and no other complaints except for pain.say she followed up with his cardiology February last year.  He has referral to go locally but has not seen md yet. He has not smoked in about 9 years. He does not drink alcohol.Son has been helping him lifting him and moving him at home for ADL's and otherwise because of pain.  In ed pt is A/O x 3 and EKG is negative for any st changes or TWI. Cardiology consulted for elevated troponin.   Assessment and Plan: Active Problems:   Tibia/fibula fracture, left, closed, with delayed healing, subsequent encounter  # Tibia/Fibula fracture: Orthopedic surgery following,  S/p intramedullary fixation of left spiral tibial shaft fracture done on 03/21/2021 Pt was at moderate anesthesia risk with h/o smoking and suspect underlying COPD. Pt also has heart diease history and c/o left precordial chest pain, currently patient is chest pain-free.  Seen by cardiologist and cleared for surgery. Continue as needed medication for pain control Further management as per primary team    # ESRD on HD: Nephrology consult for HD requested with Dr.Kolluru.  # Chest pain: Elevated troponin, trending down Currently patient is chest pain-free last echo in 2019 with EF of 60-65%. Hold antiplatelet, resume aspirin when cleared by orthopedics  cont coreg. BNP of 4500 and troponin of 222  Cardiology consult  EKG: S.Brady 59, Prolonged pR 216 and LVH, q waves on v1. TTE shows LVEF 55 to 60%, no LV wall motion  abnormality. Patient was seen by cardiologist and cleared for surgery.,  Recommended no ischemic work-up, as no significant EKG changes and troponins remained flat and ESRD status.   # Htn: On pervious admission pt's BP meds held except for coreg. 4/1 blood pressure was high so patient was given Coreg and amlodipine  4/2 blood pressure dropped yesterday evening, soft today  Decreased dose of Coreg and amlodipine, skip dose today and use tomorrow if blood pressure remains high, parameters entered. 4/3 BP soft, discontinued amlodipine Use IV hydralazine as needed We will continue to monitor BP and titrate medication accordingly  # CAD: Continue atorvastatin, chart review shows patient is not on any antiplatelet,  We will start low-dose aspirin if no contraindications as into GI bleeding, or ICH.  # CHF: Pt's lasix held, we will monitor for weights and I/O's.  Elevated BNP, continue fluid management by hemodialysis   # IDDM T2,  4/2 started Lantus 10 units nightly Started Humalog sliding scale Monitor FSBG Continue diabetic diet  # Anemia of c/h kidney: We will monitor CBC and continue epoetin therapy. Type and screen. IV PPI.  # Allergy to PCN and metformin.  # CVA: Pt on lipitor but no antiplatelet.  We will followup again tomorrow. Please contact me if I can be of assistance in the meanwhile. Thank you for this consultation.   Body mass index is 24.07 kg/m.  Nutrition Problem: Increased nutrient needs Etiology: post-op healing Interventions: Interventions: Nepro shake  Diet: Carb modified and renal diet DVT Prophylaxis: Subcutaneous Heparin    Advance goals of care discussion: Full code  Family Communication: family was Not present at bedside, at the time of interview.  The pt provided permission to discuss medical plan with the family. Opportunity was given to ask question and all questions were answered satisfactorily.   Disposition:  Pt is from  Home, admitted with Left tibial fracture, and double up chest pain with elevated troponin. Discharge to SNF versus home health PT, when cleared by cardiology and orthopedics.  Subjective: No overnight issues, patient was sitting at the bedside chair with the help of PT.  Patient is not aware whether he needs to go to SNF.  Pain is under control.  Denies any other active issues.   Physical Exam: General:  alert oriented to time, place, and person.  Appear in no distress, affect appropriate Eyes: Left eye legally blind, decreased vision in the right eye ENT: Oral Mucosa Clear, moist  Neck: no JVD,  Cardiovascular: S1 and S2 Present, no Murmur,  Respiratory: good respiratory effort, Bilateral Air entry equal and Decreased, no Crackles, n wheezes Abdomen: Bowel Sound present, Soft and no tenderness,  Skin: no rashes Extremities: no Pedal edema, no calf tenderness,s/p left tibial fixation, dressing CDI Neurologic: without any new focal findings Gait not checked due to patient safety concerns  Vitals:   03/22/21 2353 03/23/21 0441 03/23/21 0739 03/23/21 1156  BP: (!) 125/59 (!) 137/57 105/70 (!) 103/48  Pulse: (!) 57 (!) 55 (!) 55 (!) 56  Resp: 16 17 15 18   Temp: 98 F (36.7 C) (!) 97.5 F (36.4 C) 98.1 F (36.7 C) 97.9 F (36.6 C)  TempSrc: Oral Oral    SpO2: 94% 92% 94% 92%  Height:        Intake/Output Summary (Last 24 hours) at 03/23/2021 1230 Last data filed at 03/22/2021 2353 Gross per 24 hour  Intake --  Output 2000 ml  Net -2000 ml   There were no vitals filed for this visit.  Data Reviewed: I have personally reviewed and interpreted daily labs, tele strips, imagings as discussed above. I reviewed all nursing notes, pharmacy notes, vitals, pertinent old records I have discussed plan of care as described above with RN and patient/family.  CBC: Recent Labs  Lab 03/20/21 1818 03/21/21 0432 03/22/21 0411 03/23/21 0434  WBC 6.2 5.0 5.6 6.9  NEUTROABS 4.3  --   --   --    HGB 8.4* 8.9* 9.0* 8.6*  HCT 26.6* 27.8* 28.1* 27.1*  MCV 99.3 99.6 97.9 98.5  PLT 146* 151 185 096   Basic Metabolic Panel: Recent Labs  Lab 03/20/21 1818 03/21/21 0432 03/22/21 0411 03/23/21 0434  NA 137 136 136 136  K 3.8 4.0 5.2* 4.5  CL 102 103 103 100  CO2 22 22 20* 24  GLUCOSE 222* 269* 327* 128*  BUN 47* 52* 68* 47*  CREATININE 5.71* 6.13* 7.10* 5.19*  CALCIUM 8.0* 7.9* 8.0* 7.8*  MG  --   --  2.1 1.9  PHOS  --   --  6.7* 5.8*    Studies: No results found.  Scheduled Meds: . amLODipine  5 mg Oral Daily  . atorvastatin  80 mg Oral QHS  . carvedilol  3.125 mg Oral BID  . docusate sodium  100 mg Oral BID  . epoetin (EPOGEN/PROCRIT) injection  10,000 Units Intravenous Q M,W,F-HD  . heparin injection (subcutaneous)  5,000 Units Subcutaneous Q8H  . insulin aspart  0-15 Units Subcutaneous Q4H  . insulin glargine  10 Units Subcutaneous QHS  . pantoprazole (PROTONIX) IV  40 mg Intravenous Q12H  . senna  1 tablet Oral BID  . traMADol  50 mg Oral Q6H   Continuous Infusions: . sodium chloride 75 mL/hr at 03/21/21 2316  . methocarbamol (ROBAXIN) IV     PRN Meds: acetaminophen, albuterol, alum & mag hydroxide-simeth, bisacodyl, bisacodyl, hydrALAZINE, HYDROmorphone (DILAUDID) injection, magnesium citrate, menthol-cetylpyridinium **OR** phenol, methocarbamol **OR** methocarbamol (ROBAXIN) IV, ondansetron **OR** ondansetron (ZOFRAN) IV, oxyCODONE, oxyCODONE, polyethylene glycol  Time spent: 35 minutes  Author: Val Riles. MD [facility] 03/23/2021 12:29 PM  To reach On-call, see care teams to locate the attending and reach out to them via [email or web address]. If 7PM-7AM, please contact night-coverage If you still have difficulty reaching the attending provider, please page the [facility] (Director on Call) for [facility] on amion for assistance.

## 2021-03-23 NOTE — Anesthesia Postprocedure Evaluation (Signed)
Anesthesia Post Note  Patient: Richard Zavala  Procedure(s) Performed: INTRAMEDULLARY (IM) NAIL TIBIAL (Left )  Patient location during evaluation: PACU Anesthesia Type: General Level of consciousness: awake and alert and oriented Pain management: pain level controlled Vital Signs Assessment: post-procedure vital signs reviewed and stable Respiratory status: spontaneous breathing Cardiovascular status: blood pressure returned to baseline Anesthetic complications: no   No complications documented.   Last Vitals:  Vitals:   03/23/21 0441 03/23/21 0739  BP: (!) 137/57 105/70  Pulse: (!) 55 (!) 55  Resp: 17 15  Temp: (!) 36.4 C 36.7 C  SpO2: 92% 94%    Last Pain:  Vitals:   03/23/21 0900  TempSrc:   PainSc: 2                  Elnor Renovato

## 2021-03-23 NOTE — Evaluation (Signed)
Occupational Therapy Evaluation Patient Details Name: Richard Zavala MRN: [id number] DOB: 1961-02-25 Today's Date: 03/23/2021    History of Present Illness Richard Zavala is a 89yoM who comes [facility] after fall and leg injury, imaging revealing of nondisplaced tibial fracture, DC to home, then returned 3/[facility] from [facility] after FU imaging revealing of displaced tibial fracture. Pt went to OR for IM fixation of Left tibial fracture. PMH: chronic Rt foot wound, IDDM2, ESRD on HD, visual impairment, BPH, CAD, CHF, HLD, osteoporosis, remote CVA. Pt is NWB LLE post surgery.   Clinical Impression   Pt seen for OT evaluation this date in setting of acute hospitalization d/t fall with L tib fx, now s/p IM fixation. Pt presents this date with c/o fatigue d/t blood draws overnight. He states that at baseline, he is able to perform his basic self care, has his son (that lives with him, Richard Zavala) assist with IADLs including running errands and pt states that he was amb with no AD, but occasionally used RW or cane (esp surrounding HD appts as he would be more fatigued). On OT assessment, pt with decreased fxl activity tolerance and decreased L LE ROM as well as NWB precaution limiting his ability to safely and efficiently perform ADLS and fxl mobility. Pt requires MIN/MOD A with ADL transfers with RW and cues to maintain precautions and requires MAX A with LB ADLs. OT provides below-listed education including introducing pt to potential need for use of AE for LB ADLs. Pt receptive, but will require f/u. At this time, recommending pt get f/u OT in STR setting. Will continue to follow acutely in hospital setting. Pt left in chair with alarm and all needs met/in reach. RN notified of session contents.     Follow Up Recommendations  SNF    Equipment Recommendations  3 in 1 bedside commode;Tub/shower seat;Other (comment) (needs new 2WW (has one, but it was issued in 2012 and he reports rust/difficult to adjust))     Recommendations for Other Services       Precautions / Restrictions Precautions Precautions: Fall Restrictions Weight Bearing Restrictions: Yes LLE Weight Bearing: Non weight bearing      Mobility Bed Mobility Overal bed mobility: Modified Independent             General bed mobility comments: at EOB upon arrival    Transfers Overall transfer level: Needs assistance Equipment used: Rolling walker (2 wheeled) Transfers: Stand Pivot Transfers;Sit to/from Stand Sit to Stand: Min assist;Mod assist Stand pivot transfers: Mod assist Squat pivot transfers: Mod assist;Max assist     General transfer comment: increased time, heavy reliance on RW, cues for safe use of RW    Balance Overall balance assessment: Needs assistance   Sitting balance-Leahy Scale: Good       Standing balance-Leahy Scale: Poor Standing balance comment: UE support on RW and cues to sustain WB restriction                           ADL either performed or assessed with clinical judgement   ADL                       General ADL Comments: requires SETUP to MIN A for seated UB ADLs, MAX A for seated LB ADLs, MIN/MOD A for ADL transfers with RW.     Vision Patient Visual Report: No change from baseline Additional Comments: blind in L eye at  baseline. lens replacement sx to R eye, but near vision limited.     Perception     Praxis      Pertinent Vitals/Pain Pain Assessment: No/denies pain     Hand Dominance Right   Extremity/Trunk Assessment Upper Extremity Assessment Upper Extremity Assessment: Generalized weakness (ROM shlds and elbows WFL, R wrist and 4th/5th digit with h/o inj so R grip slightly weaker at ~3+/5, L grip 4-/5)   Lower Extremity Assessment Lower Extremity Assessment: Generalized weakness;LLE deficits/detail LLE: Unable to fully assess due to immobilization       Communication Communication Communication: No difficulties   Cognition  Arousal/Alertness: Awake/alert Behavior During Therapy: WFL for tasks assessed/performed Overall Cognitive Status: No family/caregiver present to determine baseline cognitive functioning               General Comments: pt is appropriate conversationally and follows commands, bot some limited insight into deficits/safety.   General Comments       Exercises Other Exercises Other Exercises: OT Facilitates ed re: role of OT, importance of OOB activity, WB precautions to L LE, safety considerations, and safe use of RW including hand placement. Pt with moderate reception, will require f/u.   Shoulder Instructions      Home Living Family/patient expects to be discharged to:: Private residence Living Arrangements: Children (son-Richard Zavala) Available Help at Discharge: Family;Available PRN/intermittently Type of Home: House Home Access: Stairs to enter [employer] of Steps: 1   Home Layout: One level     Bathroom Shower/Tub: Teacher, early years/pre: Standard     Home Equipment: Environmental consultant - 2 wheels;Cane - single point;Shower seat          Prior Functioning/Environment Level of Independence: Needs assistance  Gait / Transfers Assistance Needed: RW/cane occasionally for fxl mobility, but reports he primarily uses no AD. ADL's / Homemaking Assistance Needed: INDEP/MOD I with basic ADLs like bathing with shower seat. Son helps with IADLs such as cooking/cleaning and driving as pt with low vision at baseline            OT Problem List: Decreased strength;Decreased range of motion;Decreased activity tolerance;Impaired balance (sitting and/or standing);Decreased safety awareness;Decreased knowledge of use of DME or AE      OT Treatment/Interventions: Self-care/ADL training;DME and/or AE instruction;Therapeutic activities;Balance training;Therapeutic exercise;Patient/family education    OT Goals(Current goals can be found in the care plan section) Acute Rehab OT  Goals Patient Stated Goal: to be able to get some rest and feel better OT Goal Formulation: With patient Time For Goal Achievement: 04/06/21 Potential to Achieve Goals: Good ADL Goals Pt Will Perform Lower Body Dressing: with min assist;sitting/lateral leans (with AE PRN to thread underwear/elastice waist pants) Pt Will Transfer to Toilet: with supervision;with min guard assist;stand pivot transfer;bedside commode Pt/caregiver will Perform Home Exercise Program: Increased strength;Both right and left upper extremity;With Supervision (to improve ability to maintain precautions, using UEs to weightbear)  OT Frequency: Min 1X/week   Barriers to D/C:            Co-evaluation              AM-PAC OT "6 Clicks" Daily Activity     Outcome Measure Help from another person eating meals?: None Help from another person taking care of personal grooming?: A Little Help from another person toileting, which includes using toliet, bedpan, or urinal?: A Lot Help from another person bathing (including washing, rinsing, drying)?: A Lot Help from another person to put on and taking off  regular upper body clothing?: A Little Help from another person to put on and taking off regular lower body clothing?: A Lot 6 Click Score: 16   End of Session Equipment Utilized During Treatment: Gait belt;Rolling walker Nurse Communication: Mobility status  Activity Tolerance: Patient tolerated treatment well Patient left: in chair;with call bell/phone within reach;with chair alarm set  OT Visit Diagnosis: Unsteadiness on feet (R26.81);Muscle weakness (generalized) (M62.81)                Time: 6962-9528 OT Time Calculation (min): 47 min Charges:  OT General Charges $OT Visit: 1 Visit OT Evaluation $OT Eval Moderate Complexity: 1 Mod OT Treatments $Self Care/Home Management : 23-37 mins $Therapeutic Activity: 8-22 mins  Gerrianne Scale, MS, OTR/L ascom [phone] 03/23/21, 4:53 PM

## 2021-03-24 ENCOUNTER — Encounter: Payer: Self-pay | Admitting: Orthopedic Surgery

## 2021-03-24 DIAGNOSIS — S82402G Unspecified fracture of shaft of left fibula, subsequent encounter for closed fracture with delayed healing: Secondary | ICD-10-CM | POA: Diagnosis not present

## 2021-03-24 DIAGNOSIS — S82202G Unspecified fracture of shaft of left tibia, subsequent encounter for closed fracture with delayed healing: Secondary | ICD-10-CM | POA: Diagnosis not present

## 2021-03-24 LAB — CBC
HCT: 28.1 % — ABNORMAL LOW (ref 39.0–52.0)
Hemoglobin: 8.8 g/dL — ABNORMAL LOW (ref 13.0–17.0)
MCH: 30.8 pg (ref 26.0–34.0)
MCHC: 31.3 g/dL (ref 30.0–36.0)
MCV: 98.3 fL (ref 80.0–100.0)
Platelets: 200 10*3/uL (ref 150–400)
RBC: 2.86 MIL/uL — ABNORMAL LOW (ref 4.22–5.81)
RDW: 18.9 % — ABNORMAL HIGH (ref 11.5–15.5)
WBC: 5.9 10*3/uL (ref 4.0–10.5)
nRBC: 0 % (ref 0.0–0.2)

## 2021-03-24 LAB — GLUCOSE, CAPILLARY
Glucose-Capillary: 74 mg/dL (ref 70–99)
Glucose-Capillary: 22 mg/dL — CL (ref 70–99)
Glucose-Capillary: 76 mg/dL (ref 70–99)
Glucose-Capillary: 101 mg/dL — ABNORMAL HIGH (ref 70–99)
Glucose-Capillary: 72 mg/dL (ref 70–99)
Glucose-Capillary: 91 mg/dL (ref 70–99)
Glucose-Capillary: 87 mg/dL (ref 70–99)
Glucose-Capillary: 124 mg/dL — ABNORMAL HIGH (ref 70–99)
Glucose-Capillary: 174 mg/dL — ABNORMAL HIGH (ref 70–99)

## 2021-03-24 LAB — BASIC METABOLIC PANEL
Anion gap: 11 (ref 5–15)
BUN: 64 mg/dL — ABNORMAL HIGH (ref 6–20)
CO2: 24 mmol/L (ref 22–32)
Calcium: 8 mg/dL — ABNORMAL LOW (ref 8.9–10.3)
Chloride: 102 mmol/L (ref 98–111)
Creatinine, Ser: 6.13 mg/dL — ABNORMAL HIGH (ref 0.61–1.24)
GFR, Estimated: 10 mL/min — ABNORMAL LOW (ref >60)
Glucose, Bld: 76 mg/dL (ref 70–99)
Potassium: 5 mmol/L (ref 3.5–5.1)
Sodium: 137 mmol/L (ref 135–145)

## 2021-03-24 LAB — PHOSPHORUS: Phosphorus: 6 mg/dL — ABNORMAL HIGH (ref 2.5–4.6)

## 2021-03-24 LAB — MAGNESIUM: Magnesium: 2 mg/dL (ref 1.7–2.4)

## 2021-03-24 MED ORDER — INSULIN ASPART 100 UNIT/ML ~~LOC~~ SOLN
0.0000 [IU] | Freq: Three times a day (TID) | SUBCUTANEOUS | Status: DC
  Administered 2021-03-25 (×2): 1 [IU] via SUBCUTANEOUS
  Filled 2021-03-24 (×2): qty 1

## 2021-03-24 MED ORDER — DEXTROSE 50 % IV SOLN: 12.5000 g | Freq: Once | INTRAVENOUS | Status: DC

## 2021-03-24 MED ORDER — DEXTROSE 50 % IV SOLN
INTRAVENOUS | Status: AC
  Administered 2021-03-24: 50 mL
  Filled 2021-03-24: qty 50

## 2021-03-24 MED ORDER — NEPRO/CARBSTEADY PO LIQD
237.0000 mL | Freq: Two times a day (BID) | ORAL | Status: DC
  Administered 2021-03-24 – 2021-03-25 (×4): 237 mL via ORAL

## 2021-03-24 MED ORDER — RENA-VITE PO TABS
1.0000 | ORAL_TABLET | Freq: Every day | ORAL | Status: DC
  Administered 2021-03-24: 1 via ORAL
  Filled 2021-03-24 (×2): qty 1

## 2021-03-24 NOTE — Consult Note (Signed)
[facility] Progress Note  Patient: Richard Zavala    YNW:[id number]  DOA: 03/20/2021     Date of Service: the patient was seen and examined on 03/24/2021  Chief Complaint  Patient presents with  . Leg Injury   Brief hospital course: Pt is 60 y/o male seen in ED as consult for left tibia #, that has gotten worse. Pt states he fell on left side from what It sounds like he fell off his bed.  Today pt is alert,awake and reports chest pain since yesterday and no other complaints except for pain.say she followed up with his cardiology February last year.  He has referral to go locally but has not seen md yet. He has not smoked in about 9 years. He does not drink alcohol.Son has been helping him lifting him and moving him at home for ADL's and otherwise because of pain.  In ed pt is A/O x 3 and EKG is negative for any st changes or TWI. Cardiology consulted for elevated troponin.   Assessment and Plan: Active Problems:   Tibia/fibula fracture, left, closed, with delayed healing, subsequent encounter  # Tibia/Fibula fracture: # History possible GI bleed Orthopedic surgery following,  S/p intramedullary fixation of left spiral tibial shaft fracture done on 03/21/2021. Of note was hospitalized 01/2021 for possible GI bleed. Stool guaiac positive, h/h stable. GI declined further w/u as noted "extensive GI w/u performed @ [facility] in 2021." Pt says home aspirin was stopped at that time - for DVT ppx, if risk of thrombosis is thought to be high, would be reasonable to re-start aspirin or continue unfractionated heparin at 5000 units bid, and would monitor closely for signs bleeding. If lower risk for thrombosis (given this was distal lower extremity orthopedic surgery this may apply to this patient), reasonable to defer anticoagulation particularly in light of patient's increased bleeding risk  # ESRD on HD: - continuing MWF dialysis  # Chest pain: Elevated troponin, trending down Patient was  seen by cardiologist and cleared for surgery.,  Recommended no ischemic work-up, as no significant EKG changes and troponins remained flat and ESRD status.  # Htn: - continue carvedilol  # CAD: - continue BB, atorvastatin - off aspirin given recent GIB (see above) - will need close outpatient cardiology f/u  # CHF: Appears compensated - fluid mgmt with dialysis  # IDDM T2,  4/2 started Lantus 10 units nightly, had glucose of 22 this morning in setting of skipped breakfast, resolved with d50 and snack. a1c 7.8 this admission - hold lantus - continue SSI very sensitive  # Anemia of c/h kidney: Stable - monitor  # Allergy to PCN and metformin.  # CVA: - continue statin - no anti-platelet for now (see above)   Body mass index is 24.07 kg/m.  Nutrition Problem: Increased nutrient needs Etiology: post-op healing Interventions: Interventions: Nepro shake      Diet: Carb modified and renal diet DVT Prophylaxis: Subcutaneous Heparin    Advance goals of care discussion: Full code    Disposition:  SNF pending placement  Subjective:  Hypoglyemic this morning resolved at time of exam. Tolerating diet. Tolerated dialysis. Denies melena or hematochezia. Pain controlled   Physical Exam: General:  alert oriented to time, place, and person.  Appear in no distress, affect appropriate Eyes: Left eye legally blind, decreased vision in the right eye ENT: Oral Mucosa Clear, moist  Neck: no JVD,  Cardiovascular: S1 and S2 Present, no Murmur,  Respiratory: good respiratory effort, Bilateral  Air entry equal and Decreased, no Crackles, n wheezes Abdomen: Bowel Sound present, Soft and no tenderness,  Skin: no rashes Extremities: no Pedal edema, no calf tenderness,s/p left tibial fixation, dressing CDI Neurologic: without any new focal findings Gait not checked due to patient safety concerns  Vitals:   03/24/21 1415 03/24/21 1430 03/24/21 1445 03/24/21 1500  BP: 132/67  121/62 124/60 121/66  Pulse: 60 60 61   Resp: 10 10 11 16   Temp:      TempSrc:      SpO2:      Weight:      Height:        Intake/Output Summary (Last 24 hours) at 03/24/2021 1537 Last data filed at 03/24/2021 1500 Gross per 24 hour  Intake 19.23 ml  Output 2000 ml  Net -1980.77 ml   Filed Weights   03/24/21 1225  Weight: 71 kg    Data Reviewed: I have personally reviewed and interpreted daily labs, tele strips, imagings as discussed above. I reviewed all nursing notes, pharmacy notes, vitals, pertinent old records I have discussed plan of care as described above with RN and patient/family.  CBC: Recent Labs  Lab 03/20/21 1818 03/21/21 0432 03/22/21 0411 03/23/21 0434 03/24/21 0500  WBC 6.2 5.0 5.6 6.9 5.9  NEUTROABS 4.3  --   --   --   --   HGB 8.4* 8.9* 9.0* 8.6* 8.8*  HCT 26.6* 27.8* 28.1* 27.1* 28.1*  MCV 99.3 99.6 97.9 98.5 98.3  PLT 146* 151 185 211 161   Basic Metabolic Panel: Recent Labs  Lab 03/20/21 1818 03/21/21 0432 03/22/21 0411 03/23/21 0434 03/24/21 0500  NA 137 136 136 136 137  K 3.8 4.0 5.2* 4.5 5.0  CL 102 103 103 100 102  CO2 22 22 20* 24 24  GLUCOSE 222* 269* 327* 128* 76  BUN 47* 52* 68* 47* 64*  CREATININE 5.71* 6.13* 7.10* 5.19* 6.13*  CALCIUM 8.0* 7.9* 8.0* 7.8* 8.0*  MG  --   --  2.1 1.9 2.0  PHOS  --   --  6.7* 5.8* 6.0*    Studies: No results found.  Scheduled Meds: . atorvastatin  80 mg Oral QHS  . carvedilol  3.125 mg Oral BID  . dextrose  12.5 g Intravenous Once  . docusate sodium  100 mg Oral BID  . epoetin (EPOGEN/PROCRIT) injection  10,000 Units Intravenous Q M,W,F-HD  . feeding supplement (NEPRO CARB STEADY)  237 mL Oral BID BM  . heparin injection (subcutaneous)  5,000 Units Subcutaneous Q8H  . multivitamin  1 tablet Oral QHS  . pantoprazole (PROTONIX) IV  40 mg Intravenous Q12H  . senna  1 tablet Oral BID  . traMADol  50 mg Oral Q6H   Continuous Infusions: . sodium chloride Stopped (03/21/21 2317)  .  methocarbamol (ROBAXIN) IV     PRN Meds: acetaminophen, albuterol, alum & mag hydroxide-simeth, bisacodyl, bisacodyl, hydrALAZINE, HYDROmorphone (DILAUDID) injection, magnesium citrate, menthol-cetylpyridinium **OR** phenol, methocarbamol **OR** methocarbamol (ROBAXIN) IV, ondansetron **OR** ondansetron (ZOFRAN) IV, oxyCODONE, oxyCODONE, polyethylene glycol  Time spent: 35 minutes  Author: Laurey Arrow, MD [facility] 03/24/2021 3:37 PM  To reach On-call, see care teams to locate the attending and reach out to them via [email or web address]. If 7PM-7AM, please contact night-coverage If you still have difficulty reaching the attending provider, please page the [facility] (Director on Call) for [facility] on amion for assistance.

## 2021-03-24 NOTE — Progress Notes (Signed)
Patient refusing R foot pump. All equipment at bedside if reconsiders.

## 2021-03-24 NOTE — Progress Notes (Addendum)
[place], [place] 03/24/21  Subjective:   LOS: 4  Richard Zavala is a 60 y.o. male and established patient of [facility]. He has a past medical history of ESRD on hemodialysis, anemia, diabetes, CAD, CHF, pulmonary hypertension and osteoporosis. He presents to the ED with Tibia/fibula fracture and will undergo surgery later today.  Patient is seen resting in bed Hypoglycemic event this morning, remains drowsy    Objective:  Vital signs in last 24 hours:  Temp:  [97.7 F (36.5 C)-98.1 F (36.7 C)] 98.1 F (36.7 C) (04/03 2036) Pulse Rate:  [56-62] 62 (04/04 0446) Resp:  [18] 18 (04/04 0446) BP: (103-128)/(48-110) 104/73 (04/04 0446) SpO2:  [92 %-97 %] 94 % (04/04 0446)  Weight change:  There were no vitals filed for this visit.  Intake/Output:    Intake/Output Summary (Last 24 hours) at 03/24/2021 0954 Last data filed at 03/23/2021 2229 Gross per 24 hour  Intake 1.23 ml  Output --  Net 1.23 ml     Physical Exam: General: NAD, laying in bed  HEENT Normocephalic, moist oral mucosa  Pulm/lungs Clear sounds, regular breathing pattern  CVS/Heart S1S2 present, regular  Abdomen:  Soft, non tender, nondistended  Extremities: Min edema, left LE surgical dressing  Neurologic: drowsy, able to answer questions  Skin: No rashes or masses  Access: Rt lower arm AVF       Basic Metabolic Panel:  Recent Labs  Lab 03/20/21 1818 03/21/21 0432 03/22/21 0411 03/23/21 0434 03/24/21 0500  NA 137 136 136 136 137  K 3.8 4.0 5.2* 4.5 5.0  CL 102 103 103 100 102  CO2 22 22 20* 24 24  GLUCOSE 222* 269* 327* 128* 76  BUN 47* 52* 68* 47* 64*  CREATININE 5.71* 6.13* 7.10* 5.19* 6.13*  CALCIUM 8.0* 7.9* 8.0* 7.8* 8.0*  MG  --   --  2.1 1.9 2.0  PHOS  --   --  6.7* 5.8* 6.0*     CBC: Recent Labs  Lab 03/20/21 1818 03/21/21 0432 03/22/21 0411 03/23/21 0434 03/24/21 0500  WBC 6.2 5.0 5.6 6.9 5.9  NEUTROABS 4.3  --   --   --   --   HGB 8.4* 8.9*  9.0* 8.6* 8.8*  HCT 26.6* 27.8* 28.1* 27.1* 28.1*  MCV 99.3 99.6 97.9 98.5 98.3  PLT 146* 151 185 211 200      Lab Results  Component Value Date   HEPBSAG NON REACTIVE 03/21/2021   HEPBSAB Non Reactive 08/14/2015      Microbiology:  Recent Results (from the past 240 hour(s))  Resp Panel by RT-PCR (Flu A&B, Covid) Nasopharyngeal Swab     Status: None   Collection Time: 03/20/21  6:17 PM   Specimen: Nasopharyngeal Swab; Nasopharyngeal(NP) swabs in vial transport medium  Result Value Ref Range Status   SARS Coronavirus 2 by RT PCR NEGATIVE NEGATIVE Final    Comment: (NOTE) SARS-CoV-2 target nucleic acids are NOT DETECTED.  The SARS-CoV-2 RNA is generally detectable in upper respiratory specimens during the acute phase of infection. The lowest concentration of SARS-CoV-2 viral copies this assay can detect is 138 copies/mL. A negative result does not preclude SARS-Cov-2 infection and should not be used as the sole basis for treatment or other patient management decisions. A negative result may occur with  improper specimen collection/handling, submission of specimen other than nasopharyngeal swab, presence of viral mutation(s) within the areas targeted by this assay, and inadequate number of viral copies(<138 copies/mL). A negative result  must be combined with clinical observations, patient history, and epidemiological information. The expected result is Negative.  Fact Sheet for Patients:  EntrepreneurPulse.com.au  Fact Sheet for Healthcare Providers:  IncredibleEmployment.be  This test is no t yet approved or cleared by the [place] FDA and  has been authorized for detection and/or diagnosis of SARS-CoV-2 by FDA under an Emergency Use Authorization (EUA). This EUA will remain  in effect (meaning this test can be used) for the duration of the COVID-19 declaration under Section 564(b)(1) of the Act, 21 U.S.C.section 360bbb-3(b)(1),  unless the authorization is terminated  or revoked sooner.       Influenza A by PCR NEGATIVE NEGATIVE Final   Influenza B by PCR NEGATIVE NEGATIVE Final    Comment: (NOTE) The Xpert Xpress SARS-CoV-2/FLU/RSV plus assay is intended as an aid in the diagnosis of influenza from Nasopharyngeal swab specimens and should not be used as a sole basis for treatment. Nasal washings and aspirates are unacceptable for Xpert Xpress SARS-CoV-2/FLU/RSV testing.  Fact Sheet for Patients: EntrepreneurPulse.com.au  Fact Sheet for Healthcare Providers: IncredibleEmployment.be  This test is not yet approved or cleared by the [place] FDA and has been authorized for detection and/or diagnosis of SARS-CoV-2 by FDA under an Emergency Use Authorization (EUA). This EUA will remain in effect (meaning this test can be used) for the duration of the COVID-19 declaration under Section 564(b)(1) of the Act, 21 U.S.C. section 360bbb-3(b)(1), unless the authorization is terminated or revoked.  Performed at [facility], [address] [place], [place] [address]     Coagulation Studies: No results for input(s): LABPROT, INR in the last 72 hours.  Urinalysis: No results for input(s): COLORURINE, LABSPEC, PHURINE, GLUCOSEU, HGBUR, BILIRUBINUR, KETONESUR, PROTEINUR, UROBILINOGEN, NITRITE, LEUKOCYTESUR in the last 72 hours.  Invalid input(s): APPERANCEUR    Imaging: No results found.   Medications:   . sodium chloride Stopped (03/21/21 2317)  . methocarbamol (ROBAXIN) IV     . atorvastatin  80 mg Oral QHS  . carvedilol  3.125 mg Oral BID  . dextrose  12.5 g Intravenous Once  . docusate sodium  100 mg Oral BID  . epoetin (EPOGEN/PROCRIT) injection  10,000 Units Intravenous Q M,W,F-HD  . feeding supplement (NEPRO CARB STEADY)  237 mL Oral BID BM  . heparin injection (subcutaneous)  5,000 Units Subcutaneous Q8H  . multivitamin  1 tablet Oral QHS  .  pantoprazole (PROTONIX) IV  40 mg Intravenous Q12H  . senna  1 tablet Oral BID  . traMADol  50 mg Oral Q6H   acetaminophen, albuterol, alum & mag hydroxide-simeth, bisacodyl, bisacodyl, hydrALAZINE, HYDROmorphone (DILAUDID) injection, magnesium citrate, menthol-cetylpyridinium **OR** phenol, methocarbamol **OR** methocarbamol (ROBAXIN) IV, ondansetron **OR** ondansetron (ZOFRAN) IV, oxyCODONE, oxyCODONE, polyethylene glycol  Assessment/ Plan:  60 y.o. male with  was admitted on 03/20/2021 for  Active Problems:   Tibia/fibula fracture, left, closed, with delayed healing, subsequent encounter  ESRD (end stage renal disease) ([facility]) [N18.6] Chest pain [R07.9] Tibia/fibula fracture, left, closed, initial encounter [S82.202A, S82.402A] Tibia/fibula fracture, left, closed, with delayed healing, subsequent encounter [S82.202G, S82.402G] Closed fracture of distal end of left tibia, unspecified fracture morphology, initial encounter [S82.302A]  [facility]/MWF/ Rt lower AVF/ 72kg  #. ESRD on HD - Dialysis scheduled today - UF goal 2L - Next treatment will be Wednesday - Will monitor glucose hourly during dialysis - PT requesting afternoon treatment to allow therapy in the mornings  #. Anemia of CKD  Lab Results  Component Value Date  HGB 8.8 (L) 03/24/2021  EPO given outpatient EPO 10000 units with HD  #. Secondary hyperparathyroidism of renal origin N 25.81   No results found for: PTH Lab Results  Component Value Date   PHOS 6.0 (H) 03/24/2021  Elevated levels, will consider binders Monitor calcium and phos level during this admission   #. Diabetes type 2 with CKD Hemoglobin A1C (%)  Date Value  10/28/2012 8.3 (H)   Hgb A1c MFr Bld (%)  Date Value  03/21/2021 7.8 (H)  Hypoglycemia to 22 this morning, Rapid Response called for AMS Recovered to 76 and now 101 at 0953 Will monitor  # Left spiral tibial shaft fracture - surgery performed on 03/21/21 - surgical dressing  in place   LOS: [place] 4/4/20229:54 [place], [place]

## 2021-03-24 NOTE — Progress Notes (Signed)
Subjective:  POD #3 s/p intramedullary fixation for left tibial shaft fracture.   Patient reports left leg pain as mild to moderate.  Patient seen in the dialysis center.  Objective:   VITALS:   Vitals:   03/24/21 1225 03/24/21 1230 03/24/21 1245 03/24/21 1300  BP:  (!) 120/100 122/82 118/90  Pulse:  (!) 58 (!) 59 (!) 58  Resp:  14 12 10   Temp:      TempSrc:      SpO2:      Weight: 71 kg     Height: 5\' 8"  (1.727 m)       PHYSICAL EXAM: Left lower extremity: Dressing and splint are clean dry and intact.  Patient's toes are well-perfused.  There is no evidence of skin breakdown over the proximal or distal ends of the splint.  His compartments are soft and compressible.  He has diminished sensation to light touch in his toes which is at his baseline.  Patient states he has trouble feeling his toes normally likely due to diabetic neuropathy.  LABS  Results for orders placed or performed during the hospital encounter of 03/20/21 (from the past 24 hour(s))  Glucose, capillary     Status: Abnormal   Collection Time: 03/23/21  3:49 PM  Result Value Ref Range   Glucose-Capillary 106 (H) 70 - 99 mg/dL  Glucose, capillary     Status: Abnormal   Collection Time: 03/23/21  8:45 PM  Result Value Ref Range   Glucose-Capillary 164 (H) 70 - 99 mg/dL  Glucose, capillary     Status: Abnormal   Collection Time: 03/23/21 11:39 PM  Result Value Ref Range   Glucose-Capillary 119 (H) 70 - 99 mg/dL  Glucose, capillary     Status: None   Collection Time: 03/24/21  4:49 AM  Result Value Ref Range   Glucose-Capillary 74 70 - 99 mg/dL  Basic metabolic panel     Status: Abnormal   Collection Time: 03/24/21  5:00 AM  Result Value Ref Range   Sodium 137 135 - 145 mmol/L   Potassium 5.0 3.5 - 5.1 mmol/L   Chloride 102 98 - 111 mmol/L   CO2 24 22 - 32 mmol/L   Glucose, Bld 76 70 - 99 mg/dL   BUN 64 (H) 6 - 20 mg/dL   Creatinine, Ser 6.13 (H) 0.61 - 1.24 mg/dL   Calcium 8.0 (L) 8.9 - 10.3 mg/dL    GFR, Estimated 10 (L) >60 mL/min   Anion gap 11 5 - 15  CBC     Status: Abnormal   Collection Time: 03/24/21  5:00 AM  Result Value Ref Range   WBC 5.9 4.0 - 10.5 K/uL   RBC 2.86 (L) 4.22 - 5.81 MIL/uL   Hemoglobin 8.8 (L) 13.0 - 17.0 g/dL   HCT 28.1 (L) 39.0 - 52.0 %   MCV 98.3 80.0 - 100.0 fL   MCH 30.8 26.0 - 34.0 pg   MCHC 31.3 30.0 - 36.0 g/dL   RDW 18.9 (H) 11.5 - 15.5 %   Platelets 200 150 - 400 K/uL   nRBC 0.0 0.0 - 0.2 %  Magnesium     Status: None   Collection Time: 03/24/21  5:00 AM  Result Value Ref Range   Magnesium 2.0 1.7 - 2.4 mg/dL  Phosphorus     Status: Abnormal   Collection Time: 03/24/21  5:00 AM  Result Value Ref Range   Phosphorus 6.0 (H) 2.5 - 4.6 mg/dL  Glucose, capillary  Status: Abnormal   Collection Time: 03/24/21  8:32 AM  Result Value Ref Range   Glucose-Capillary 22 (LL) 70 - 99 mg/dL   Comment 1 Notify RN   Glucose, capillary     Status: None   Collection Time: 03/24/21  8:51 AM  Result Value Ref Range   Glucose-Capillary 76 70 - 99 mg/dL  Glucose, capillary     Status: Abnormal   Collection Time: 03/24/21  9:53 AM  Result Value Ref Range   Glucose-Capillary 101 (H) 70 - 99 mg/dL  Glucose, capillary     Status: None   Collection Time: 03/24/21 12:00 PM  Result Value Ref Range   Glucose-Capillary 72 70 - 99 mg/dL  Glucose, capillary     Status: None   Collection Time: 03/24/21 12:53 PM  Result Value Ref Range   Glucose-Capillary 91 70 - 99 mg/dL    No results found.  Assessment/Plan: 3 Days Post-Op   Active Problems:   Tibia/fibula fracture, left, closed, with delayed healing, subsequent encounter  Patient stable postop.  Continue dialysis Monday Wednesday Friday.  Continue physical therapy.  Patient may be toe-touch weightbearing on the left lower extremity.  Will need a skilled nursing facility upon discharge.  Continue heparin for DVT prophylaxis.  Would appreciate medicine/nephrology input on  anticoagulation after discharge  that is not nephrotoxic.    Thornton Park , MD 03/24/2021, 1:09 PM

## 2021-03-24 NOTE — Progress Notes (Signed)
Inpatient Diabetes Program Recommendations  AACE/ADA: New Consensus Statement on Inpatient Glycemic Control   Target Ranges:  Prepandial:   less than 140 mg/dL      Peak postprandial:   less than 180 mg/dL (1-2 hours)      Critically ill patients:  140 - 180 mg/dL   Results for Richard Zavala, Richard Zavala" (MRN [id number]) as of 03/24/2021 10:14  Ref. Range 03/23/2021 07:42 03/23/2021 11:56 03/23/2021 15:49 03/23/2021 20:45 03/23/2021 23:39 03/24/2021 04:49 03/24/2021 08:32 03/24/2021 08:51 03/24/2021 09:53  Glucose-Capillary Latest Ref Range: 70 - 99 mg/dL 99 143 (H) 106 (H) 164 (H)   Lantus 10 units @21 :11 119 (H) 74 22 (LL) 76 101 (H)   Review of Glycemic Control  Diabetes history: DM Outpatient Diabetes medications: Lantus 9-10 units daily Current orders for Inpatient glycemic control: None  Inpatient Diabetes Program Recommendations:    Insulin: Noted Lantus and Novolog insulin were discontinued due to hypoglycemia. Would recommend reordering Novolog correction but with sensitive scale 0-9 units TID with meals and Novolog 0-5 unis QHS.   Thanks, Barnie Alderman, RN, MSN, CDE Diabetes Coordinator Inpatient Diabetes Program [phone] (Team Pager from 8am to 5pm)

## 2021-03-24 NOTE — Consult Note (Signed)
[facility] Nurse Consult Note: Reason for Consult:Right dorsal foot with several wounds, the largest and most serious being at the base of the 4th and 5th digits with is full thickness and with yellow wound bed. Patient is followed in the community by the outpatient [facility] (Dr. Laurene Footman) and was last seen in that center on Thursday, 03/20/21. Wound type: Neuropathic Pressure Injury POA: N/A Measurement: circular 1cm x 1cm x 0.3cm wound on dorsal aspect of right foot with yellow, moist wound bed.  Several other partial and full thickness wounds on anterior foot of lesser size and depth. Those ulcers are dry. Wound bed: As noted above Drainage (amount, consistency, odor) As noted above Periwound: feet with poor hygiene and evidence of LOPS. Dressing procedure/placement/frequency: I have instructed patient on the importance of daily foot hygiene and the use of protective shoe wear with his loss of protective sensation/neuropathy. Care while in house will be a daily soap and water cleanse, rinse and pat dry followed by the placement of an antimicrobial nonadherent dressing (xeroform) topped with an ABD pad and secured with a few turns of kerlix roll gauze/paper tape.  Patient should return to the care and follow up of the outpatient [facility] upon discharge.  [facility] nursing team will not follow, but will remain available to this patient, the nursing and medical teams.  Please re-consult if needed. Thanks, Maudie Flakes, MSN, RN, [facility], Arther Abbott  Pager# [phone]

## 2021-03-24 NOTE — Progress Notes (Signed)
Physical Therapy Treatment Patient Details Name: Richard Zavala MRN: [id number] DOB: [birth year] Today's Date: 03/24/2021    History of Present Illness Richard Zavala is a 26yoM who comes to [facility] 3/28 after fall and leg injury, imaging revealing of nondisplaced tibial fracture, DC to home, then [facility] after FU imaging revealing of displaced tibial fracture. Pt went to OR for IM fixation of Left tibial fracture. PMH: chronic Rt foot wound, IDDM2, ESRD on HD, visual impairment, BPH, CAD, CHF, HLD, osteoporosis, remote CVA. Pt is NWB LLE post surgery.    PT Comments    Deferred session earlier due to ngs request due to low BS,  Returned later in am and pt getting to commode with tech.  Assisted with +2 min/mod a stand pivot transfer to [facility].  Overall does well with maintaining NWB today with transfer.  Session limited today as pt very agitated yelling at staff during activity.  Attempted to calm but unable and further interaction increasing agitation.  Pt left on commode with call bell in reach per his request and returned to assist RN with transfer back to bed.   Follow Up Recommendations  SNF;Supervision for mobility/OOB     Equipment Recommendations  Wheelchair (measurements PT);Wheelchair cushion (measurements PT)    Recommendations for Other Services       Precautions / Restrictions Precautions Precautions: Fall Restrictions RLE Weight Bearing: Weight bearing as tolerated LLE Weight Bearing: Non weight bearing    Mobility  Bed Mobility Overal bed mobility: Modified Independent                  Transfers Overall transfer level: Needs assistance Equipment used: None;2 person hand held assist Transfers: Stand Pivot Transfers;Sit to/from Stand Sit to Stand: Min assist;+2 physical assistance Stand pivot transfers: Min assist;+2 physical assistance;Mod assist          Ambulation/Gait                 Stairs              Wheelchair Mobility    Modified Rankin (Stroke Patients Only)       Balance Overall balance assessment: Needs assistance Sitting-balance support: Feet supported Sitting balance-Leahy Scale: Good     Standing balance support: Bilateral upper extremity supported Standing balance-Leahy Scale: Poor                              Cognition Arousal/Alertness: Awake/alert Behavior During Therapy: Agitated Overall Cognitive Status: No family/caregiver present to determine baseline cognitive functioning                                        Exercises Other Exercises Other Exercises: transfer to/from commode    General Comments        Pertinent Vitals/Pain Pain Assessment: Faces Faces Pain Scale: Hurts a little bit Pain Location: no specific c/o - too agitated to verbalize Pain Intervention(s): Limited activity within patient's tolerance;Repositioned    Home Living Family/patient expects to be discharged to:: Private residence Living Arrangements: Children (son-robert)                  Prior Function            PT Goals (current goals can now be found in the care plan section) Progress towards PT goals: Progressing toward goals  Frequency    7X/week      PT Plan Current plan remains appropriate    Co-evaluation              AM-PAC PT "6 Clicks" Mobility   Outcome Measure  Help needed turning from your back to your side while in a flat bed without using bedrails?: A Little Help needed moving from lying on your back to sitting on the side of a flat bed without using bedrails?: A Little Help needed moving to and from a bed to a chair (including a wheelchair)?: A Lot Help needed standing up from a chair using your arms (e.g., wheelchair or bedside chair)?: A Lot Help needed to walk in hospital room?: Total Help needed climbing 3-5 steps with a railing? : Total 6 Click Score: 12    End of Session Equipment  Utilized During Treatment: Gait belt Activity Tolerance: Patient tolerated treatment well;No increased pain Patient left: in bed;with bed alarm set;with call bell/phone within reach;with nursing/sitter in room Nurse Communication: Mobility status PT Visit Diagnosis: Unsteadiness on feet (R26.81);Difficulty in walking, not elsewhere classified (R26.2);Other abnormalities of gait and mobility (R26.89);Muscle weakness (generalized) (M62.81)     Time: 1040-1055 PT Time Calculation (min) (ACUTE ONLY): 15 min  Charges:  $Therapeutic Activity: 8-22 mins                    Richard Zavala, PTA 03/24/21, 11:21 AM

## 2021-03-24 NOTE — Progress Notes (Signed)
Patient A&O x4 and able to make needs known. Transport has attempted x2 to take him to his dialysis session and he has refused stating he was not ready. Patient was assisted to [facility] and requested all staff to leave the room. Curtain was pulled to give privacy which was unacceptable to patient even after explaining fall precautions.  This nurse entered room and patient became very upset, yelling stating he needed to urinate and others would leave him alone. Explained I would wait outside door until finished. Patient called me into room, still upset and yelling, stating he couldn't urinate and for [place] to just straight cath him. Bladder scan completed showing 151mL urine. Was able to speak with patient and calm him down, only from yelling, and he agreed to going to dialysis. Stayed with patient until transport arrived at 11:23 and transported him to dialysis.

## 2021-03-24 NOTE — Care Management Important Message (Signed)
Important Message  Patient Details  Name: Richard Zavala MRN: [id number] Date of Birth: [birth year]   Medicare Important Message Given:  Yes     Juliann Pulse A Kumiko Fishman 03/24/2021, 11:16 AM

## 2021-03-24 NOTE — Progress Notes (Addendum)
Patient has attempted to have a BM multiple times today without success. Currently straining a lot and states his stomach feels bloated. Patient is passing gas and bowels are active. MD aware and PRNs given.

## 2021-03-24 NOTE — Progress Notes (Signed)
Most recent blood glucose in HD was 87. See results for POC. Pt refused to during any more of the Nepro or Apple juice. No signs of distress noted at this time, pt is alert and oriented

## 2021-03-24 NOTE — Progress Notes (Addendum)
Hypoglycemic Event  CBG:22  Treatment: 12.5g D50  8 ounces orange juice  Symptoms diaphoretic, AMS  Follow-up CBG: Time 0850 CBG Result:76  Possible Reasons for Event: did not eat dinner Comments/MD Dr Debbe Bales Tapp Chistopher Mangino

## 2021-03-25 ENCOUNTER — Encounter: Payer: Medicare Other | Admitting: Physical Therapy

## 2021-03-25 ENCOUNTER — Inpatient Hospital Stay: Payer: Medicare Other

## 2021-03-25 LAB — RESP PANEL BY RT-PCR (FLU A&B, COVID) ARPGX2
Influenza A by PCR: NEGATIVE
Influenza B by PCR: NEGATIVE
SARS Coronavirus 2 by RT PCR: NEGATIVE

## 2021-03-25 LAB — BASIC METABOLIC PANEL
Anion gap: 12 (ref 5–15)
BUN: 42 mg/dL — ABNORMAL HIGH (ref 6–20)
CO2: 27 mmol/L (ref 22–32)
Calcium: 8.2 mg/dL — ABNORMAL LOW (ref 8.9–10.3)
Chloride: 98 mmol/L (ref 98–111)
Creatinine, Ser: 5.07 mg/dL — ABNORMAL HIGH (ref 0.61–1.24)
GFR, Estimated: 12 mL/min — ABNORMAL LOW (ref >60)
Glucose, Bld: 174 mg/dL — ABNORMAL HIGH (ref 70–99)
Potassium: 4.7 mmol/L (ref 3.5–5.1)
Sodium: 137 mmol/L (ref 135–145)

## 2021-03-25 LAB — CBC
HCT: 29.5 % — ABNORMAL LOW (ref 39.0–52.0)
Hemoglobin: 9.5 g/dL — ABNORMAL LOW (ref 13.0–17.0)
MCH: 31.4 pg (ref 26.0–34.0)
MCHC: 32.2 g/dL (ref 30.0–36.0)
MCV: 97.4 fL (ref 80.0–100.0)
Platelets: 199 10*3/uL (ref 150–400)
RBC: 3.03 MIL/uL — ABNORMAL LOW (ref 4.22–5.81)
RDW: 18.9 % — ABNORMAL HIGH (ref 11.5–15.5)
WBC: 5.6 10*3/uL (ref 4.0–10.5)
nRBC: 0 % (ref 0.0–0.2)

## 2021-03-25 LAB — HEPATITIS B DNA, ULTRAQUANTITATIVE, PCR
HBV DNA SERPL PCR-ACNC: NOT DETECTED IU/mL
HBV DNA SERPL PCR-LOG IU: UNDETERMINED log10 IU/mL

## 2021-03-25 LAB — GLUCOSE, CAPILLARY
Glucose-Capillary: 171 mg/dL — ABNORMAL HIGH (ref 70–99)
Glucose-Capillary: 165 mg/dL — ABNORMAL HIGH (ref 70–99)
Glucose-Capillary: 137 mg/dL — ABNORMAL HIGH (ref 70–99)
Glucose-Capillary: 127 mg/dL — ABNORMAL HIGH (ref 70–99)

## 2021-03-25 MED ORDER — CALCIUM ACETATE (PHOS BINDER) 667 MG PO CAPS
1334.0000 mg | ORAL_CAPSULE | Freq: Three times a day (TID) | ORAL | Status: DC
  Administered 2021-03-25: 1334 mg via ORAL
  Filled 2021-03-25: qty 2

## 2021-03-25 MED ORDER — OXYCODONE HCL 5 MG PO TABS: 5.0000 mg | ORAL_TABLET | ORAL | 0 refills | Status: DC | PRN

## 2021-03-25 MED ORDER — ASPIRIN EC 81 MG PO TBEC: 81.0000 mg | DELAYED_RELEASE_TABLET | Freq: Two times a day (BID) | ORAL | 2 refills | Status: DC

## 2021-03-25 MED ORDER — PANTOPRAZOLE SODIUM 40 MG PO TBEC
40.0000 mg | DELAYED_RELEASE_TABLET | Freq: Every day | ORAL | Status: DC
  Administered 2021-03-25: 40 mg via ORAL
  Filled 2021-03-25: qty 1

## 2021-03-25 NOTE — TOC Progression Note (Signed)
Transition of Care [facility]) - Progression Note    Patient Details  Name: Richard Zavala MRN: [id number] Date of Birth: Oct 15, 1961  Transition of [facility]) CM/SW Contact  Su Hilt, RN Phone Number: 03/25/2021, 4:07 PM  Clinical Narrative:   [facility], Nurse aware, sent DC papers to facility    Expected Discharge Plan: Home/Self Care Barriers to Discharge: Continued Medical Work up  Expected Discharge Plan and Services Expected Discharge Plan: Home/Self Care       Living arrangements for the past 2 months: Single Family Home Expected Discharge Date: 03/25/21                                     Social Determinants of Health (SDOH) Interventions    Readmission Risk Interventions Readmission Risk Prevention Plan 02/14/2021 02/09/2021 02/08/2021  Transportation Screening Complete - Complete  PCP or Specialist Appt within 3-5 Days - - Complete  HRI or Home Care Consult - - Patient refused  Social Work Consult for [place] Planning/Counseling - Patient refused Patient refused  Palliative Care Screening - Patient Refused Patient Refused  Palliative Care Screening Not Complete Comments - (No Data) -  Medication Review Press photographer) Complete Referral to Pharmacy Referral to Pharmacy  PCP or Specialist appointment within 3-5 days of discharge Complete - -  [place] or [place] Patient refused - -  SW Recovery Care/Counseling Consult Patient refused - -  Palliative Care Screening Not Applicable - -  [place] Not Applicable - -  Some recent data might be hidden

## 2021-03-25 NOTE — Discharge Summary (Signed)
Physician Discharge Summary  Patient ID: Richard Zavala MRN: [id number] DOB/AGE: Apr 26, 1961 59 y.o.  Admit date: 03/20/2021 Discharge date: 03/25/2021  Admission Diagnoses:  Left Tibial Fracture <principal problem not specified>  Discharge Diagnoses:  Left Tibial Fracture Active Problems:   Tibia/fibula fracture, left, closed, with delayed healing, subsequent encounter End stage renal disease requiring hemodialysis Diabetes  Past Medical History:  Diagnosis Date  . Anemia   . Blind   . BPH (benign prostatic hyperplasia)   . CAD (coronary artery disease)   . CHF (congestive heart failure) ([place])   . Chronic kidney disease (CKD), stage IV (severe) (HCC)    DIALYSIS  . Diabetes mellitus without complication ([place])   . ESRD (end stage renal disease) ([place])    Monday-Wednesday-Friday dialysis  . Hyperlipemia   . Hypertension   . Neuropathy   . Osteoporosis   . Pulmonary HTN ([place])   . Stroke [facility])    2013  . TIA (transient ischemic attack)   . TRD (traction retinal detachment)   . Wilms' tumor [facility])     Surgeries: Procedure(s): INTRAMEDULLARY (IM) NAIL FOR LEFT TIBIA SHAFT FRACTURE on 03/21/2021   Consultants (if any): Treatment Team:  Gwynne Edinger, MD  Discharged Condition: Improved  Hospital Course: Richard Zavala is an 59 y.o. male who was admitted 03/20/2021 with a diagnosis of  Left Tibial Fracture <principal problem not specified> and went to the operating room on 03/21/2021 and underwent an uncomplicated intramedullary fixation for a left tibial shaft fracture.    He was given perioperative antibiotics:  Anti-infectives (From admission, onward)   Start     Dose/Rate Route Frequency Ordered Stop   03/21/21 1730  clindamycin (CLEOCIN) IVPB 600 mg        600 mg 100 mL/hr over 30 Minutes Intravenous Every 6 hours 03/21/21 1728 03/22/21 0838   03/21/21 1331  clindamycin (CLEOCIN) 600 MG/50ML IVPB       Note to Pharmacy: Lyman Bishop   : cabinet override       03/21/21 1331 03/21/21 1910   03/20/21 1900  clindamycin (CLEOCIN) IVPB 600 mg        600 mg 100 mL/hr over 30 Minutes Intravenous  Once 03/20/21 1859 03/20/21 2128    .  He was given sequential compression devices, early ambulation, and heparin for DVT prophylaxis.  Patient was admitted for postoperative pain control, physical therapy and hemodialysis.  Patient progressed from an orthopedic standpoint.  He was followed by the hospitalist during his hospitalization.  Patient received hemodialysis while an inpatient.  He benefited maximally from the hospital stay and there were no complications.    Recent vital signs:  Vitals:   03/25/21 0852 03/25/21 1343  BP: (!) 127/107 (!) 100/57  Pulse: 66 64  Resp: 16 16  Temp: 98 F (36.7 C) 98.7 F (37.1 C)  SpO2: 93% 98%    Recent laboratory studies:  Lab Results  Component Value Date   HGB 9.5 (L) 03/25/2021   HGB 8.8 (L) 03/24/2021   HGB 8.6 (L) 03/23/2021   Lab Results  Component Value Date   WBC 5.6 03/25/2021   PLT 199 03/25/2021   Lab Results  Component Value Date   INR 1.4 (H) 03/20/2021   Lab Results  Component Value Date   NA 137 03/25/2021   K 4.7 03/25/2021   CL 98 03/25/2021   CO2 27 03/25/2021   BUN 42 (H) 03/25/2021   CREATININE 5.07 (H) 03/25/2021   GLUCOSE 174 (  H) 03/25/2021    Discharge Medications:   Allergies as of 03/25/2021      Reactions   Metformin Other (See Comments)   Severe diarrhea    Penicillins Hives, Other (See Comments)   Has patient had a PCN reaction causing immediate rash, facial/tongue/throat swelling, SOB or lightheadedness with hypotension:Yes Has patient had a PCN reaction causing severe rash involving mucus membranes or skin necrosis:Yes Has patient had a PCN reaction that required hospitalization:inpatient at the time of reaction. Has patient had a PCN reaction occurring within the last 10 years:No If all of the above answers are "NO", then may proceed with Cephalosporin use.       Medication List    STOP taking these medications   amLODipine 10 MG tablet Commonly known as: NORVASC   HYDROcodone-acetaminophen 5-325 MG tablet Commonly known as: NORCO/VICODIN   isosorbide mononitrate 30 MG 24 hr tablet Commonly known as: IMDUR     TAKE these medications   albuterol 108 (90 Base) MCG/ACT inhaler Commonly known as: VENTOLIN HFA Inhale 2 puffs into the lungs every 6 (six) hours as needed.   aspirin EC 81 MG tablet Take 1 tablet (81 mg total) by mouth 2 (two) times daily. Swallow whole.   atorvastatin 80 MG tablet Commonly known as: Lipitor Take 1 tablet (80 mg total) by mouth at bedtime.   carvedilol 12.5 MG tablet Commonly known as: COREG Take 1 tablet (12.5 mg total) by mouth 2 (two) times daily.   epoetin alfa 10000 UNIT/ML injection Commonly known as: EPOGEN Inject 0.8 mLs (8,000 Units total) into the vein Every Tuesday,Thursday,and Saturday with dialysis.   furosemide 40 MG tablet Commonly known as: LASIX Hold until followup with outpatient doctor due to intermittent low blood pressure.   insulin glargine 100 UNIT/ML injection Commonly known as: LANTUS Inject 9-10 Units into the skin daily.   lidocaine 5 % Commonly known as: LIDODERM Place 1 patch onto the skin daily.   oxyCODONE 5 MG immediate release tablet Commonly known as: Oxy IR/ROXICODONE Take 1 tablet (5 mg total) by mouth every 4 (four) hours as needed for moderate pain (pain score 4-6).   pantoprazole 40 MG tablet Commonly known as: PROTONIX Take 40 mg by mouth 2 (two) times daily before a meal.       Diagnostic Studies: DG Chest 1 View  Result Date: 03/20/2021 CLINICAL DATA:  Chest pain leg fracture EXAM: CHEST  1 VIEW COMPARISON:  02/12/2021 FINDINGS: Cardiomegaly with vascular congestion and mild interstitial edema. No consolidation or pneumothorax IMPRESSION: Cardiomegaly with vascular congestion and mild interstitial edema. Electronically Signed   By: Donavan Foil M.D.   On: 03/20/2021 19:43   DG Tibia/Fibula Left  Result Date: 03/21/2021 CLINICAL DATA:  Surgery.  Tibial fracture EXAM: DG C-ARM 1-60 MIN; LEFT TIBIA AND FIBULA - 2 VIEW FLUOROSCOPY TIME:  Fluoroscopy Time:  1 minutes 59 seconds. Number of Acquired Spot Images: 6 COMPARISON:  March 20, 2021. FINDINGS: Six C-arm fluoroscopic images were obtained intraoperatively and submitted for post operative interpretation. These images demonstrate intramedullary rod and screw fixation of a spiral fracture of the distal tibial diaphysis. Improved, near anatomic alignment. Fibular neck fracture was better characterized on prior radiographs. Please see the performing provider's procedural report for further detail. IMPRESSION: Intraoperative fluoroscopy, as detailed above. Electronically Signed   By: Margaretha Sheffield MD   On: 03/21/2021 17:23   DG Tibia/Fibula Left  Result Date: 03/20/2021 CLINICAL DATA:  Fall with fracture on Sunday night.  Re-injured.  EXAM: LEFT TIBIA AND FIBULA - 2 VIEW COMPARISON:  Radiograph 03/16/2021 FINDINGS: Displaced spiral fracture of the distal tibial shaft has increased displacement from prior exam. No evidence of intra-articular extension, no ankle joint effusion. There is both a healed remote is well as acute fracture of the fibular neck. Acute component was not seen on prior. There is posterior splint material in place. Bones diffusely under mineralized. IMPRESSION: 1. Displaced spiral fracture of the distal tibial shaft, increased displacement from prior exam. 2. New acute fracture of the proximal fibular neck. This was not seen on prior exam, there is a chronic healed proximal fibular fracture. Electronically Signed   By: Keith Rake M.D.   On: 03/20/2021 19:26   DG Tibia/Fibula Left  Result Date: 03/16/2021 CLINICAL DATA:  Left lower leg injury. Slipped getting into bed today. EXAM: LEFT TIBIA AND FIBULA - 2 VIEW COMPARISON:  None. FINDINGS: Mildly displaced spiral  fracture of the distal tibial metadiaphysis. Lateral displacement of 1-2 mm of the distal fracture fragment. No intra-articular component is visualized. Proximal fibular shaft fracture is remote. No acute fibular fracture. Knee and ankle alignment are maintained. The bones are diffusely under mineralized. There is soft tissue edema at the fracture site anteriorly, and to a lesser extent diffusely throughout the lower leg. IMPRESSION: 1. Mildly displaced spiral fracture of the distal tibial metadiaphysis. No intra-articular component visualized by radiograph. 2. Proximal fibular shaft fracture is healed/remote. Electronically Signed   By: Keith Rake M.D.   On: 03/16/2021 23:29   US RENAL  Result Date: 03/04/2021 CLINICAL DATA:  Distended urinary bladder EXAM: RENAL / URINARY TRACT ULTRASOUND COMPLETE COMPARISON:  CT 02/06/2021 FINDINGS: Right Kidney: Renal measurements: 10.4 x 4.8 x 5.3 cm = volume: 137 mL. Cortex is echogenic. Mild diffuse cortical thinning. No mass or hydronephrosis. Shadowing calcifications within the central right kidney either due to stones or calcified vessels as seen on CT. Left Kidney: Surgically absent Bladder: Decompressed. Other: Calcifications in the spleen which may be vascular. Small left pleural effusion IMPRESSION: 1. Status post left nephrectomy. 2. Echogenic right kidney with mild cortical thinning consistent with medical renal disease. No hydronephrosis. Intrarenal shadowing calcifications, favored to represent intravascular calcifications over stones given findings on CT from February. 3. Small left pleural effusion Electronically Signed   By: Donavan Foil M.D.   On: 03/04/2021 17:56   DG Tibia/Fibula Left Port  Result Date: 03/21/2021 CLINICAL DATA:  Post ORIF. EXAM: PORTABLE LEFT TIBIA AND FIBULA - 2 VIEW COMPARISON:  Intraoperative radiographs earlier the same date and 03/20/2021. FINDINGS: Left tibial intramedullary nail is secured by 2 proximal and 2 distal  interlocking screws. The hardware appears well positioned. There is improved alignment of the spiral fracture of the mid to distal tibial diaphysis. Nondisplaced fracture of the proximal fibula appears unchanged. There is underlying posttraumatic deformity of the proximal fibular diaphysis. No complications are identified. Diffuse vascular calcifications are noted. There are skin staples anterior to the patellar tendon. IMPRESSION: Improved alignment of the tibial fracture post ORIF. No demonstrated complication. Electronically Signed   By: Richardean Sale M.D.   On: 03/21/2021 17:29   DG C-Arm 1-60 Min  Result Date: 03/21/2021 CLINICAL DATA:  Surgery.  Tibial fracture EXAM: DG C-ARM 1-60 MIN; LEFT TIBIA AND FIBULA - 2 VIEW FLUOROSCOPY TIME:  Fluoroscopy Time:  1 minutes 59 seconds. Number of Acquired Spot Images: 6 COMPARISON:  March 20, 2021. FINDINGS: Six C-arm fluoroscopic images were obtained intraoperatively and submitted for post operative interpretation.  These images demonstrate intramedullary rod and screw fixation of a spiral fracture of the distal tibial diaphysis. Improved, near anatomic alignment. Fibular neck fracture was better characterized on prior radiographs. Please see the performing provider's procedural report for further detail. IMPRESSION: Intraoperative fluoroscopy, as detailed above. Electronically Signed   By: Margaretha Sheffield MD   On: 03/21/2021 17:23   ECHOCARDIOGRAM COMPLETE  Result Date: 03/21/2021    ECHOCARDIOGRAM REPORT   Patient Name:   Richard Zavala Date of Exam: 03/21/2021 Medical Rec #:  [id number]       Height:       68.0 in Accession #:    [id number]      Weight:       158.3 lb Date of Birth:  09-28-1961       BSA:          1.850 m Patient Age:    61 years        BP:           158/53 mmHg Patient Gender: M               HR:           62 bpm. Exam Location:  [facility] Procedure: 2D Echo, Color Doppler, Cardiac Doppler and Strain Analysis Indications:     I50.21 CHF-Acute  Systolic  History:         Patient has prior history of Echocardiogram examinations. CHF,                  CAD, TIA and CKD; Risk Factors:Hypertension and Dyslipidemia.  Sonographer:     Charmayne Sheer RDCS (AE) Referring Phys:  [place] Diagnosing Phys: Isaias Cowman MD IMPRESSIONS  1. Left ventricular ejection fraction, by estimation, is 55 to 60%. The left ventricle has normal function. The left ventricle has no regional wall motion abnormalities. Left ventricular diastolic parameters were normal.  2. Right ventricular systolic function is normal. The right ventricular size is normal.  3. The mitral valve is normal in structure. Mild to moderate mitral valve regurgitation. No evidence of mitral stenosis.  4. Tricuspid valve regurgitation is mild to moderate. Mild tricuspid stenosis.  5. The aortic valve is normal in structure. Aortic valve regurgitation is not visualized. Mild to moderate aortic valve sclerosis/calcification is present, without any evidence of aortic stenosis.  6. The inferior vena cava is normal in size with greater than 50% respiratory variability, suggesting right atrial pressure of 3 mmHg. FINDINGS  Left Ventricle: Left ventricular ejection fraction, by estimation, is 55 to 60%. The left ventricle has normal function. The left ventricle has no regional wall motion abnormalities. The left ventricular internal cavity size was normal in size. There is  no left ventricular hypertrophy. Left ventricular diastolic parameters were normal. Right Ventricle: The right ventricular size is normal. No increase in right ventricular wall thickness. Right ventricular systolic function is normal. Left Atrium: Left atrial size was normal in size. Right Atrium: Right atrial size was normal in size. Pericardium: There is no evidence of pericardial effusion. Mitral Valve: The mitral valve is normal in structure. Mild to moderate mitral valve regurgitation. No evidence of mitral valve stenosis. MV  peak gradient, 3.8 mmHg. The mean mitral valve gradient is 2.0 mmHg. Tricuspid Valve: The tricuspid valve is normal in structure. Tricuspid valve regurgitation is mild to moderate. Mild tricuspid stenosis. Aortic Valve: The aortic valve is normal in structure. Aortic valve regurgitation is not visualized. Mild to moderate aortic valve sclerosis/calcification  is present, without any evidence of aortic stenosis. Aortic valve mean gradient measures 5.0 mmHg. Aortic valve peak gradient measures 8.6 mmHg. Aortic valve area, by VTI measures 4.23 cm. Pulmonic Valve: The pulmonic valve was normal in structure. Pulmonic valve regurgitation is not visualized. No evidence of pulmonic stenosis. Aorta: The aortic root is normal in size and structure. Venous: The inferior vena cava is normal in size with greater than 50% respiratory variability, suggesting right atrial pressure of 3 mmHg. IAS/Shunts: No atrial level shunt detected by color flow Doppler.  LEFT VENTRICLE PLAX 2D LVIDd:         5.60 cm  Diastology LVIDs:         4.00 cm  LV e' medial:    3.81 cm/s LV PW:         1.60 cm  LV E/e' medial:  26.8 LV IVS:        1.30 cm  LV e' lateral:   6.64 cm/s LVOT diam:     2.50 cm  LV E/e' lateral: 15.4 LV SV:         120 LV SV Index:   65 LVOT Area:     4.91 cm  RIGHT VENTRICLE RV Basal diam:  3.80 cm LEFT ATRIUM             Index       RIGHT ATRIUM           Index LA diam:        4.70 cm 2.54 cm/m  RA Area:     24.00 cm LA Vol (A2C):   68.3 ml 36.91 ml/m RA Volume:   76.20 ml  41.18 ml/m LA Vol (A4C):   64.5 ml 34.86 ml/m LA Biplane Vol: 68.1 ml 36.80 ml/m  AORTIC VALVE                   PULMONIC VALVE AV Area (Vmax):    3.81 cm    PV Vmax:       1.00 m/s AV Area (Vmean):   3.65 cm    PV Vmean:      62.100 cm/s AV Area (VTI):     4.23 cm    PV VTI:        0.178 m AV Vmax:           147.00 cm/s PV Peak grad:  4.0 mmHg AV Vmean:          98.600 cm/s PV Mean grad:  2.0 mmHg AV VTI:            0.283 m AV Peak Grad:       8.6 mmHg AV Mean Grad:      5.0 mmHg LVOT Vmax:         114.00 cm/s LVOT Vmean:        73.400 cm/s LVOT VTI:          0.244 m LVOT/AV VTI ratio: 0.86  AORTA Ao Root diam: 3.60 cm MITRAL VALVE                 TRICUSPID VALVE MV Area (PHT): 4.15 cm      TR Peak grad:   34.3 mmHg MV Area VTI:   3.27 cm      TR Vmax:        293.00 cm/s MV Peak grad:  3.8 mmHg MV Mean grad:  2.0 mmHg      SHUNTS MV Vmax:       0.97 m/s  Systemic VTI:  0.24 m MV Vmean:      69.3 cm/s     Systemic Diam: 2.50 cm MV Decel Time: 183 msec MR Peak grad:    139.7 mmHg MR Mean grad:    81.0 mmHg MR Vmax:         591.00 cm/s MR Vmean:        420.0 cm/s MR PISA:         1.57 cm MR PISA Eff ROA: 8 mm MR PISA Radius:  0.50 cm MV E velocity: 102.00 cm/s MV A velocity: 108.00 cm/s MV E/A ratio:  0.94 Isaias Cowman MD Electronically signed by Isaias Cowman MD Signature Date/Time: 03/21/2021/11:50:59 AM    Final     Disposition: Discharge disposition: 01-Home or Self Care       Discharge Instructions    Call MD / Call 911   Complete by: As directed    If you experience chest pain or shortness of breath, CALL 911 and be transported to the hospital emergency room.  If you develope a fever above 101 F, pus (white drainage) or increased drainage or redness at the wound, or calf pain, call your surgeon's office.   Constipation Prevention   Complete by: As directed    Drink plenty of fluids.  Prune juice may be helpful.  You may use a stool softener, such as Colace (over the counter) 100 mg twice a day.  Use MiraLax (over the counter) for constipation as needed.   Diet general   Complete by: As directed    Discharge instructions   Complete by: As directed    The patient will continue to remain non-weightbearing on the left lower extremity with use of a walker.   The dressing and splint should remain on until follow up in the office.  The patient will take aspirin 81 mg twice  a day for blood clot prevention and continue  to work on knee range of motion exercises at home as instructed by physical therapy until follow-up in the office.   Driving restrictions   Complete by: As directed    No driving for 4-00 weeks   Increase activity slowly as tolerated   Complete by: As directed    Lifting restrictions   Complete by: As directed    No lifting for 12-16 weeks         Signed: Thornton Park ,MD 03/25/2021, 2:56 PM

## 2021-03-25 NOTE — Progress Notes (Signed)
PT Cancellation Note  Patient Details Name: Richard Zavala MRN: [id number] DOB: Dec 17, 1961   Cancelled Treatment:     PT attempt. Pt nicely refused. "Can we do it later? I just got back into bed."   Willette Pa 03/25/2021, 1:02 PM

## 2021-03-25 NOTE — Progress Notes (Signed)
Subjective:  POD #4 s/p intramedullary fixation of left tibial shaft fracture.   Patient reports left leg pain as mild to moderate.  Patient sleeping in bed.    Objective:   VITALS:   Vitals:   03/24/21 2340 03/25/21 0430 03/25/21 0852 03/25/21 1343  BP: 120/64 125/70 (!) 127/107 (!) 100/57  Pulse: 72 67 66 64  Resp: 17 16 16 16   Temp: 98.3 F (36.8 C) 97.9 F (36.6 C) 98 F (36.7 C) 98.7 F (37.1 C)  TempSrc:    Oral  SpO2: 96% 92% 93% 98%  Weight:      Height:        PHYSICAL EXAM: Left lower extremity: Dressing and splint are clean dry and intact. Patient can flex and extend his toes. Patient's left toes are well-perfused.  He has baseline sensation to light touch in the toes of his left foot   LABS  Results for orders placed or performed during the hospital encounter of 03/20/21 (from the past 24 hour(s))  Glucose, capillary     Status: Abnormal   Collection Time: 03/24/21  3:57 PM  Result Value Ref Range   Glucose-Capillary 124 (H) 70 - 99 mg/dL  Glucose, capillary     Status: Abnormal   Collection Time: 03/24/21  8:45 PM  Result Value Ref Range   Glucose-Capillary 174 (H) 70 - 99 mg/dL   Comment 1 Notify RN   Basic metabolic panel     Status: Abnormal   Collection Time: 03/25/21  5:37 AM  Result Value Ref Range   Sodium 137 135 - 145 mmol/L   Potassium 4.7 3.5 - 5.1 mmol/L   Chloride 98 98 - 111 mmol/L   CO2 27 22 - 32 mmol/L   Glucose, Bld 174 (H) 70 - 99 mg/dL   BUN 42 (H) 6 - 20 mg/dL   Creatinine, Ser 5.07 (H) 0.61 - 1.24 mg/dL   Calcium 8.2 (L) 8.9 - 10.3 mg/dL   GFR, Estimated 12 (L) >60 mL/min   Anion gap 12 5 - 15  CBC     Status: Abnormal   Collection Time: 03/25/21  5:37 AM  Result Value Ref Range   WBC 5.6 4.0 - 10.5 K/uL   RBC 3.03 (L) 4.22 - 5.81 MIL/uL   Hemoglobin 9.5 (L) 13.0 - 17.0 g/dL   HCT 29.5 (L) 39.0 - 52.0 %   MCV 97.4 80.0 - 100.0 fL   MCH 31.4 26.0 - 34.0 pg   MCHC 32.2 30.0 - 36.0 g/dL   RDW 18.9 (H) 11.5 - 15.5 %    Platelets 199 150 - 400 K/uL   nRBC 0.0 0.0 - 0.2 %  Glucose, capillary     Status: Abnormal   Collection Time: 03/25/21  7:38 AM  Result Value Ref Range   Glucose-Capillary 171 (H) 70 - 99 mg/dL  Resp Panel by RT-PCR (Flu A&B, Covid) Nasopharyngeal Swab     Status: None   Collection Time: 03/25/21 12:14 PM   Specimen: Nasopharyngeal Swab; Nasopharyngeal(NP) swabs in vial transport medium  Result Value Ref Range   SARS Coronavirus 2 by RT PCR NEGATIVE NEGATIVE   Influenza A by PCR NEGATIVE NEGATIVE   Influenza B by PCR NEGATIVE NEGATIVE  Glucose, capillary     Status: Abnormal   Collection Time: 03/25/21 12:14 PM  Result Value Ref Range   Glucose-Capillary 165 (H) 70 - 99 mg/dL    No results found.  Assessment/Plan: 4 Days Post-Op   Active Problems:  Tibia/fibula fracture, left, closed, with delayed healing, subsequent encounter  Patient stable postop from an orthopedic standpoint.  Patient is ready for discharge to a skilled nursing facility.  Appreciate hospitalist consult.  Patient will be discharged on low-dose aspirin for DVT prophylaxis.      Thornton Park , MD 03/25/2021, 2:46 PM

## 2021-03-25 NOTE — TOC Progression Note (Signed)
Transition of Care [facility]) - Progression Note    Patient Details  Name: Richard Zavala MRN: [id number] Date of Birth: 08-03-1961  Transition of Care [facility]) CM/SW [place], RN Phone Number: 03/25/2021, 9:52 AM  Clinical Narrative:   Met with the patient and reviewed the bed offer for [facility], he is agreeable to go, Notified the physicain    Expected Discharge Plan: Home/Self Care Barriers to Discharge: Continued Medical Work up  Expected Discharge Plan and Services Expected Discharge Plan: Home/Self Care       Living arrangements for the past 2 months: Single Family Home                                       Social Determinants of Health (SDOH) Interventions    Readmission Risk Interventions Readmission Risk Prevention Plan 02/14/2021 02/09/2021 02/08/2021  Transportation Screening Complete - Complete  PCP or Specialist Appt within 3-5 Days - - Complete  HRI or Home Care Consult - - Patient refused  Social Work Consult for [place] Planning/Counseling - Patient refused Patient refused  Palliative Care Screening - Patient Refused Patient Refused  Palliative Care Screening Not Complete Comments - (No Data) -  Medication Review Press photographer) Complete Referral to Pharmacy Referral to Pharmacy  PCP or Specialist appointment within 3-5 days of discharge Complete - -  [place] or Home Care Consult Patient refused - -  SW Recovery Care/Counseling Consult Patient refused - -  Palliative Care Screening Not Applicable - -  [place] Not Applicable - -  Some recent data might be hidden

## 2021-03-25 NOTE — Progress Notes (Addendum)
IV access in L AC removed d/t occlusion. Requested IV Team for new access. Patient has 2 fistulas in left lower forearm and 1 fistula at right lower forearm, IV nurse requested MD order stating ok to stick above fistulas. MD notified and Protonix will be switched to PO, therefore IV access not needed.

## 2021-03-25 NOTE — Progress Notes (Addendum)
Patient is A&O x4 and able to make needs known. AVS reviewed with patient who verbalized understanding via teach back re medications, follow up appointments, limitations and restrictions. Patient will transfer via EMS to [facility] this afternoon and in agreement with discharge plan. Have attempted to call report to facility x4 with no answer. Will continue to try until transport arrives.  Addendum: 03/25/2021 1624 received call from [place], RN from [facility] for report. Information relayed and explained unsure of ETA which she stated was ok.

## 2021-03-25 NOTE — Progress Notes (Signed)
[place], [place] 03/25/21  Subjective:   LOS: 5  Richard Zavala is a 60 y.o. male and established patient of [facility]. He has a past medical history of ESRD on hemodialysis, anemia, diabetes, CAD, CHF, pulmonary hypertension and osteoporosis. He presents to the ED with Tibia/fibula fracture and will undergo surgery later today.  Patient is seen sitting in chair Says he feels good this morning No pain from left leg Denies nausea, decreased appetite Denies shortness of breath    Objective:  Vital signs in last 24 hours:  Temp:  [97.9 F (36.6 C)-98.8 F (37.1 C)] 98 F (36.7 C) (04/05 0852) Pulse Rate:  [57-72] 66 (04/05 0852) Resp:  [10-20] 16 (04/05 0852) BP: (116-132)/(60-107) 127/107 (04/05 0852) SpO2:  [92 %-98 %] 93 % (04/05 0852) Weight:  [71 kg] 71 kg (04/04 1225)  Weight change:  Filed Weights   03/24/21 1225  Weight: 71 kg    Intake/Output:    Intake/Output Summary (Last 24 hours) at 03/25/2021 0948 Last data filed at 03/24/2021 1500 Gross per 24 hour  Intake --  Output 2000 ml  Net -2000 ml     Physical Exam: General: NAD, laying in bed  HEENT Normocephalic, moist oral mucosa  Pulm/lungs Clear sounds, regular breathing pattern  CVS/Heart S1S2 present, regular  Abdomen:  Soft, non tender, nondistended  Extremities: Min edema, left LE surgical dressing  Neurologic: drowsy, able to answer questions  Skin: No rashes or masses  Access: Rt lower arm AVF       Basic Metabolic Panel:  Recent Labs  Lab 03/21/21 0432 03/22/21 0411 03/23/21 0434 03/24/21 0500 03/25/21 0537  NA 136 136 136 137 137  K 4.0 5.2* 4.5 5.0 4.7  CL 103 103 100 102 98  CO2 22 20* 24 24 27   GLUCOSE 269* 327* 128* 76 174*  BUN 52* 68* 47* 64* 42*  CREATININE 6.13* 7.10* 5.19* 6.13* 5.07*  CALCIUM 7.9* 8.0* 7.8* 8.0* 8.2*  MG  --  2.1 1.9 2.0  --   PHOS  --  6.7* 5.8* 6.0*  --      CBC: Recent Labs  Lab 03/20/21 1818 03/21/21 0432  03/22/21 0411 03/23/21 0434 03/24/21 0500 03/25/21 0537  WBC 6.2 5.0 5.6 6.9 5.9 5.6  NEUTROABS 4.3  --   --   --   --   --   HGB 8.4* 8.9* 9.0* 8.6* 8.8* 9.5*  HCT 26.6* 27.8* 28.1* 27.1* 28.1* 29.5*  MCV 99.3 99.6 97.9 98.5 98.3 97.4  PLT 146* 151 185 211 200 199      Lab Results  Component Value Date   HEPBSAG NON REACTIVE 03/21/2021   HEPBSAB Non Reactive 08/14/2015      Microbiology:  Recent Results (from the past 240 hour(s))  Resp Panel by RT-PCR (Flu A&B, Covid) Nasopharyngeal Swab     Status: None   Collection Time: 03/20/21  6:17 PM   Specimen: Nasopharyngeal Swab; Nasopharyngeal(NP) swabs in vial transport medium  Result Value Ref Range Status   SARS Coronavirus 2 by RT PCR NEGATIVE NEGATIVE Final    Comment: (NOTE) SARS-CoV-2 target nucleic acids are NOT DETECTED.  The SARS-CoV-2 RNA is generally detectable in upper respiratory specimens during the acute phase of infection. The lowest concentration of SARS-CoV-2 viral copies this assay can detect is 138 copies/mL. A negative result does not preclude SARS-Cov-2 infection and should not be used as the sole basis for treatment or other patient management decisions. A negative result  may occur with  improper specimen collection/handling, submission of specimen other than nasopharyngeal swab, presence of viral mutation(s) within the areas targeted by this assay, and inadequate number of viral copies(<138 copies/mL). A negative result must be combined with clinical observations, patient history, and epidemiological information. The expected result is Negative.  Fact Sheet for Patients:  EntrepreneurPulse.com.au  Fact Sheet for Healthcare Providers:  IncredibleEmployment.be  This test is no t yet approved or cleared by the [place] FDA and  has been authorized for detection and/or diagnosis of SARS-CoV-2 by FDA under an Emergency Use Authorization (EUA). This EUA will  remain  in effect (meaning this test can be used) for the duration of the COVID-19 declaration under Section 564(b)(1) of the Act, 21 U.S.C.section 360bbb-3(b)(1), unless the authorization is terminated  or revoked sooner.       Influenza A by PCR NEGATIVE NEGATIVE Final   Influenza B by PCR NEGATIVE NEGATIVE Final    Comment: (NOTE) The Xpert Xpress SARS-CoV-2/FLU/RSV plus assay is intended as an aid in the diagnosis of influenza from Nasopharyngeal swab specimens and should not be used as a sole basis for treatment. Nasal washings and aspirates are unacceptable for Xpert Xpress SARS-CoV-2/FLU/RSV testing.  Fact Sheet for Patients: EntrepreneurPulse.com.au  Fact Sheet for Healthcare Providers: IncredibleEmployment.be  This test is not yet approved or cleared by the [place] FDA and has been authorized for detection and/or diagnosis of SARS-CoV-2 by FDA under an Emergency Use Authorization (EUA). This EUA will remain in effect (meaning this test can be used) for the duration of the COVID-19 declaration under Section 564(b)(1) of the Act, 21 U.S.C. section 360bbb-3(b)(1), unless the authorization is terminated or revoked.  Performed at [facility], [address] [place], [place] [address]     Coagulation Studies: No results for input(s): LABPROT, INR in the last 72 hours.  Urinalysis: No results for input(s): COLORURINE, LABSPEC, PHURINE, GLUCOSEU, HGBUR, BILIRUBINUR, KETONESUR, PROTEINUR, UROBILINOGEN, NITRITE, LEUKOCYTESUR in the last 72 hours.  Invalid input(s): APPERANCEUR    Imaging: No results found.   Medications:   . methocarbamol (ROBAXIN) IV     . atorvastatin  80 mg Oral QHS  . carvedilol  3.125 mg Oral BID  . dextrose  12.5 g Intravenous Once  . docusate sodium  100 mg Oral BID  . epoetin (EPOGEN/PROCRIT) injection  10,000 Units Intravenous Q M,W,F-HD  . feeding supplement (NEPRO CARB STEADY)   237 mL Oral BID BM  . heparin injection (subcutaneous)  5,000 Units Subcutaneous Q8H  . insulin aspart  0-6 Units Subcutaneous TID WC  . multivitamin  1 tablet Oral QHS  . pantoprazole (PROTONIX) IV  40 mg Intravenous Q12H  . senna  1 tablet Oral BID  . traMADol  50 mg Oral Q6H   acetaminophen, albuterol, alum & mag hydroxide-simeth, bisacodyl, bisacodyl, hydrALAZINE, HYDROmorphone (DILAUDID) injection, magnesium citrate, menthol-cetylpyridinium **OR** phenol, methocarbamol **OR** methocarbamol (ROBAXIN) IV, ondansetron **OR** ondansetron (ZOFRAN) IV, oxyCODONE, oxyCODONE, polyethylene glycol  Assessment/ Plan:  60 y.o. male with  was admitted on 03/20/2021 for  Active Problems:   Tibia/fibula fracture, left, closed, with delayed healing, subsequent encounter  ESRD (end stage renal disease) ([place]) [N18.6] Chest pain [R07.9] Tibia/fibula fracture, left, closed, initial encounter [S82.202A, S82.402A] Tibia/fibula fracture, left, closed, with delayed healing, subsequent encounter [S82.202G, S82.402G] Closed fracture of distal end of left tibia, unspecified fracture morphology, initial encounter [S82.302A]  [facility]/MWF/ Rt lower AVF/ 72kg  #. ESRD on HD - Received dialysis yesterday - UF goal  2L acheived - Next treatment will be Wednesday - Monitored glucose hourly during treatment - PT requesting afternoon treatment to allow therapy in the mornings, will attempt to accommodate  #. Anemia of CKD  Lab Results  Component Value Date   HGB 9.5 (L) 03/25/2021  EPO given outpatient EPO 10000 units with HD  #. Secondary hyperparathyroidism of renal origin N 25.81   No results found for: PTH Lab Results  Component Value Date   PHOS 6.0 (H) 03/24/2021  Begin Calcium Acetate with meals Monitor calcium and phos level during this admission   #. Diabetes type 2 with CKD Hemoglobin A1C (%)  Date Value  10/28/2012 8.3 (H)   Hgb A1c MFr Bld (%)  Date Value  03/21/2021 7.8  (H)   Will monitor  # Left spiral tibial shaft fracture - surgery performed on 03/21/21 - surgical dressing in place - pain managed   LOS: [place] 4/5/20229:[address] [place], [place]

## 2021-03-25 NOTE — Progress Notes (Addendum)
Physical Therapy Treatment Patient Details Name: Richard Zavala MRN: [id number] DOB: 03/14/1961 Today's Date: 03/25/2021    History of Present Illness Richard Zavala is a 82yoM who comes [facility]/28 after fall and leg injury, imaging revealing of nondisplaced tibial fracture, DC to home, then [facility] from [facility] after FU imaging revealing of displaced tibial fracture. Pt went to OR for IM fixation of Left tibial fracture. PMH: chronic Rt foot wound, IDDM2, ESRD on HD, visual impairment, BPH, CAD, CHF, HLD, osteoporosis, remote CVA. Pt is NWB LLE post surgery.    PT Comments    Pt was long sitting in bed upon arriving. He reports feeling a little nauseous but is agreeable to session. Was able to exit R side of bed, stand to RW and pivot to recliner. Needs constant vcs for proper wt bearing and improved technique. Session discontinued due to pt having vomiting spell. Emesis bag issued and RN staff notified. Acute PT will continue to follow per current POC. Will benefit from SNF at DC to address deficits while assisting pt to PLOF.      Follow Up Recommendations  SNF;Supervision for mobility/OOB     Equipment Recommendations  Other (comment) (defer to next level of care)       Precautions / Restrictions Precautions Precautions: Fall Restrictions Weight Bearing Restrictions: Yes RLE Weight Bearing: Weight bearing as tolerated LLE Weight Bearing: Non weight bearing    Mobility  Bed Mobility Overal bed mobility: Modified Independent             General bed mobility comments: no physical assistance required to achieve EOB short sit    Transfers Overall transfer level: Needs assistance Equipment used: Rolling walker (2 wheeled) Transfers: Sit to/from Stand Sit to Stand: Min assist;Mod assist (of one) Stand pivot transfers: Mod assist       General transfer comment: Pt was able to stand pivot to recliner from EOB short sit with mod assist + vcs. He struggles with  maintaining proper wt bearing but did attempt to limit wt as much as possible but still struggles.  Ambulation/Gait             General Gait Details: unsafe to advance due to wt bearing restrictions and pt's inabilty to adhere     Balance Overall balance assessment: Needs assistance Sitting-balance support: Feet supported Sitting balance-Leahy Scale: Good     Standing balance support: Bilateral upper extremity supported Standing balance-Leahy Scale: Poor     Cognition Arousal/Alertness: Awake/alert Behavior During Therapy: WFL for tasks assessed/performed Overall Cognitive Status: No family/caregiver present to determine baseline cognitive functioning      General Comments: pt is appropriate conversationally and follows commands, but some limited insight into deficits/safety.             Pertinent Vitals/Pain Pain Assessment: No/denies pain Faces Pain Scale: No hurt           PT Goals (current goals can now be found in the care plan section) Acute Rehab PT Goals Patient Stated Goal: none stated Progress towards PT goals: Progressing toward goals    Frequency    7X/week      PT Plan Current plan remains appropriate       AM-PAC PT "6 Clicks" Mobility   Outcome Measure  Help needed turning from your back to your side while in a flat bed without using bedrails?: A Little Help needed moving from lying on your back to sitting on the side of a flat bed  without using bedrails?: A Little Help needed moving to and from a bed to a chair (including a wheelchair)?: A Lot Help needed standing up from a chair using your arms (e.g., wheelchair or bedside chair)?: A Lot Help needed to walk in hospital room?: Total Help needed climbing 3-5 steps with a railing? : Total 6 Click Score: 12    End of Session Equipment Utilized During Treatment: Gait belt Activity Tolerance: No increased pain;Other (comment) (limited by nausea and vomiting) Patient left: in chair;with  call bell/phone within reach;with chair alarm set;with nursing/sitter in room Nurse Communication: Mobility status PT Visit Diagnosis: Unsteadiness on feet (R26.81);Difficulty in walking, not elsewhere classified (R26.2);Other abnormalities of gait and mobility (R26.89);Muscle weakness (generalized) (M62.81)     Time: 1445-1500 PT Time Calculation (min) (ACUTE ONLY): 15 min  Charges:  $Therapeutic Activity: 8-22 mins                     Julaine Fusi PTA 03/25/21, 3:24 PM

## 2021-03-26 ENCOUNTER — Ambulatory Visit: Payer: Medicare Other | Admitting: Internal Medicine

## 2021-03-27 ENCOUNTER — Encounter: Payer: Medicare Other | Admitting: Physical Therapy

## 2021-03-28 NOTE — Progress Notes (Addendum)
Richard Zavala ([id number]) Visit Report for 03/19/2021 Arrival Information Details Patient Name: Richard Zavala, Richard Zavala. Date of Service: 03/19/2021 2:00 PM Medical Record Number: [id number] Patient Account Number: [id number] Date of Birth/Sex: 1961-03-18 (60 y.o. M) Treating RN: Cornell Barman Primary Care  Brucato: Lethea Killings Other Clinician: Referring Kacey Vicuna: Lethea Killings Treating Zephaniah Enyeart/Extender: Tito Dine in Treatment: 1 Visit Information History Since Last Visit Added or deleted any medications: No Patient Arrived: Wheel Chair Had a fall or experienced change in Yes Arrival Time: 14:15 activities of daily living that may affect Accompanied By: son risk of falls: Transfer Assistance: None Hospitalized since last visit: No Patient Identification Verified: Yes Pain Present Now: No Secondary Verification Process Completed: Yes Patient Requires Transmission-Based Precautions: No Patient Has Alerts: No Electronic Signature(s) Signed: 03/19/2021 4:51:14 PM By: Jeanine Luz Entered By: Jeanine Luz on 03/19/2021 14:15:47 Schear, Lorelee Market ([id number]) -------------------------------------------------------------------------------- Encounter Discharge Information Details Patient Name: Richard Zavala Date of Service: 03/19/2021 2:00 PM Medical Record Number: [id number] Patient Account Number: [id number] Date of Birth/Sex: 1961-06-29 (60 y.o. M) Treating RN: Dolan Amen Primary Care Allison Deshotels: Lethea Killings Other Clinician: Referring Giani Winther: Lethea Killings Treating Lun Muro/Extender: Tito Dine in Treatment: 1 Encounter Discharge Information Items Post Procedure Vitals Discharge Condition: Stable Unable to obtain vitals 97.9, 63, 20, unable to obtain BP d/t shunts Reason: in both arms Ambulatory Status: Wheelchair Discharge Destination: Home Transportation: Private Auto Accompanied By: son Schedule Follow-up Appointment: Yes Clinical Summary  of Care: Electronic Signature(s) Signed: 03/19/2021 5:03:56 PM By: Georges Mouse, Minus Breeding RN Entered By: Georges Mouse, Minus Breeding on 03/19/2021 17:03:56 [place], Lorelee Market ([id number]) -------------------------------------------------------------------------------- Lower Extremity Assessment Details Patient Name: Richard Zavala Date of Service: 03/19/2021 2:00 PM Medical Record Number: [id number] Patient Account Number: [id number] Date of Birth/Sex: [birth year] (60 y.o. M) Treating RN: Cornell Barman Primary Care Courtny Bennison: Lethea Killings Other Clinician: Referring Yoon Barca: Lethea Killings Treating Lycan Davee/Extender: Tito Dine in Treatment: 1 Vascular Assessment Pulses: Dorsalis Pedis Palpable: [Right:No] Electronic Signature(s) Signed: 03/20/2021 9:44:32 AM By: Gretta Cool, BSN, RN, CWS, Kim RN, BSN Signed: 03/28/2021 8:11:40 AM By: Carlene Coria RN Entered By: Carlene Coria on 03/19/2021 14:39:45 Geisen, Lorelee Market ([id number]) -------------------------------------------------------------------------------- Multi Wound Chart Details Patient Name: Richard Zavala Date of Service: 03/19/2021 2:00 PM Medical Record Number: [id number] Patient Account Number: [id number] Date of Birth/Sex: May 22, 1961 (60 y.o. M) Treating RN: Cornell Barman Primary Care Giavonna Pflum: Lethea Killings Other Clinician: Referring Lorraine Terriquez: Lethea Killings Treating Leigh Blas/Extender: Tito Dine in Treatment: 1 Vital Signs Height(in): 68 Pulse(bpm): 63 Weight(lbs): 158 Blood Pressure(mmHg): Body Mass Index(BMI): 24 Temperature(F): 97.9 Respiratory Rate(breaths/min): 20 Photos: [2:No Photos] [N/A:N/A] Wound Location: Right, Dorsal Foot Left Lower Leg N/A Wounding Event: Gradually Appeared Gradually Appeared N/A Primary Etiology: Diabetic Wound/Ulcer of the Lower Diabetic Wound/Ulcer of the Lower N/A Extremity Extremity Comorbid History: Congestive Heart Failure, Coronary Congestive Heart Failure, Coronary  N/A Artery Disease, Hypertension, Type Artery Disease, Hypertension, Type II Diabetes II Diabetes Date Acquired: 02/18/2021 02/18/2021 N/A Weeks of Treatment: 1 1 N/A Wound Status: Open Open N/A Measurements L x W x D (cm) 3.8x2x0.8 N/A N/A Area (cm) : 5.969 N/A N/A Volume (cm) : 4.775 N/A N/A % Reduction in Area: -26.70% N/A N/A % Reduction in Volume: -913.80% N/A N/A Starting Position 1 (o'clock): 12 Ending Position 1 (o'clock): 12 Maximum Distance 1 (cm): 2.3 Undermining: Yes N/A N/A Classification: Grade 2 Grade 2 N/A Exudate Amount: Medium Medium N/A Exudate Type: Sanguinous Serosanguineous N/A Exudate Color: red red, brown N/A Granulation Amount:  Small (1-33%) Small (1-33%) N/A Granulation Quality: Red N/A N/A Necrotic Amount: Large (67-100%) Large (67-100%) N/A Necrotic Tissue: Eschar, Adherent [place]field N/A Exposed Structures: Fat Layer (Subcutaneous Tissue): Fat Layer (Subcutaneous Tissue): N/A Yes Yes Fascia: No Fascia: No Tendon: No Tendon: No Muscle: No Muscle: No Joint: No Joint: No Bone: No Bone: No Epithelialization: None None N/A Debridement: Debridement - Excisional N/A N/A Pre-procedure Verification/Time 14:45 N/A N/A Out Taken: Tissue Debrided: Subcutaneous, Slough N/A N/A Level: Skin/Subcutaneous Tissue N/A N/A Debridement Area (sq cm): 7.6 N/A N/A Instrument: Curette N/A N/A NYEEM, STOKE ([id number]) Bleeding: Minimum N/A N/A Hemostasis Achieved: Pressure N/A N/A Debridement Treatment Procedure was tolerated well N/A N/A Response: Post Debridement 3.8x2x0.8 N/A N/A Measurements L x W x D (cm) Post Debridement Volume: 4.775 N/A N/A (cm) Assessment Notes: N/A Patient came in today with a cast N/A on his left leg. Wound cannot be assessed at this time. Procedures Performed: Debridement N/A N/A Treatment Notes Electronic Signature(s) Signed: 03/20/2021 8:16:57 AM By: Linton Ham MD Entered By: Linton Ham on  03/19/2021 14:59:24 Richard Zavala ([id number]) -------------------------------------------------------------------------------- Multi-Disciplinary Care Plan Details Patient Name: Zavala, [name] Date of Service: 03/19/2021 2:00 PM Medical Record Number: [id number] Patient Account Number: [id number] Date of Birth/Sex: March 24, 1961 (60 y.o. M) Treating RN: Cornell Barman Primary Care Oshae Simmering: Lethea Killings Other Clinician: Referring Amalea Ottey: Lethea Killings Treating Hillard Goodwine/Extender: Tito Dine in Treatment: 1 Active Inactive Electronic Signature(s) Signed: 05/13/2021 5:03:34 PM By: Gretta Cool, BSN, RN, CWS, Kim RN, BSN Previous Signature: 03/20/2021 9:44:32 AM Version By: Gretta Cool, BSN, RN, CWS, Kim RN, BSN Entered By: Gretta Cool, BSN, RN, CWS, Kim on 05/13/2021 17:03:33 Pixley, Lorelee Market ([id number]) -------------------------------------------------------------------------------- Pain Assessment Details Patient Name: RESHAUN, BRISENO Date of Service: 03/19/2021 2:00 PM Medical Record Number: [id number] Patient Account Number: [id number] Date of Birth/Sex: 07/10/1961 (60 y.o. M) Treating RN: Cornell Barman Primary Care Alexsis Branscom: Lethea Killings Other Clinician: Referring Cleda Imel: Lethea Killings Treating Ottis Sarnowski/Extender: Tito Dine in Treatment: 1 Active Problems Location of Pain Severity and Description of Pain Patient Has Paino No Site Locations Pain Management and Medication Current Pain Management: Electronic Signature(s) Signed: 03/20/2021 9:44:32 AM By: Gretta Cool, BSN, RN, CWS, Kim RN, BSN Signed: 03/28/2021 8:11:40 AM By: Carlene Coria RN Entered By: Carlene Coria on 03/19/2021 14:39:36 Rybicki, Lorelee Market ([id number]) -------------------------------------------------------------------------------- Patient/Caregiver Education Details Patient Name: Richard Zavala Date of Service: 03/19/2021 2:00 PM Medical Record Number: [id number] Patient Account Number: [id number] Date of  Birth/Gender: 1961/05/12 (60 y.o. M) Treating RN: Cornell Barman Primary Care Physician: Lethea Killings Other Clinician: Referring Physician: Lethea Killings Treating Physician/Extender: Tito Dine in Treatment: 1 Education Assessment Education Provided To: Patient Education Topics Provided Wound/Skin Impairment: Handouts: Caring for Your Ulcer Methods: Demonstration, Explain/Verbal Responses: State content correctly Electronic Signature(s) Signed: 03/20/2021 9:44:32 AM By: Gretta Cool, BSN, RN, CWS, Kim RN, BSN Entered By: Gretta Cool, BSN, RN, CWS, Kim on 03/19/2021 14:51:41 Richard Zavala ([id number]) -------------------------------------------------------------------------------- Wound Assessment Details Patient Name: Richard Zavala Date of Service: 03/19/2021 2:00 PM Medical Record Number: [id number] Patient Account Number: [id number] Date of Birth/Sex: 1961/08/24 (60 y.o. M) Treating RN: Cornell Barman Primary Care Nykole Matos: Lethea Killings Other Clinician: Referring Mikkel Charrette: Lethea Killings Treating Kinsie Belford/Extender: Tito Dine in Treatment: 1 Wound Status Wound Number: 1 Primary Diabetic Wound/Ulcer of the Lower Extremity Etiology: Wound Location: Right, Dorsal Foot Wound Status: Open Wounding Event: Gradually Appeared Comorbid Congestive Heart Failure, Coronary Artery Disease, Date Acquired: 02/18/2021 History: Hypertension, Type II Diabetes Weeks  Of Treatment: 1 Clustered Wound: No Photos Wound Measurements Length: (cm) 3.8 Width: (cm) 2 Depth: (cm) 0.8 Area: (cm) 5.969 Volume: (cm) 4.775 % Reduction in Area: -26.7% % Reduction in Volume: -913.8% Epithelialization: None Tunneling: No Undermining: Yes Starting Position (o'clock): 12 Ending Position (o'clock): 12 Maximum Distance: (cm) 2.3 Wound Description Classification: Grade 2 Exudate Amount: Medium Exudate Type: Sanguinous Exudate Color: red Foul Odor After Cleansing: No Slough/Fibrino  Yes Wound Bed Granulation Amount: Small (1-33%) Exposed Structure Granulation Quality: Red Fascia Exposed: No Necrotic Amount: Large (67-100%) Fat Layer (Subcutaneous Tissue) Exposed: Yes Necrotic Quality: Eschar, Adherent Slough Tendon Exposed: No Muscle Exposed: No Joint Exposed: No Bone Exposed: No Electronic Signature(s) Signed: 03/19/2021 4:51:14 PM By: Jeanine Luz Signed: 03/20/2021 9:44:32 AM By: Gretta Cool, BSN, RN, CWS, Kim RN, BSN Entered By: Jeanine Luz on 03/19/2021 14:38:15 Juliana, Lorelee Market ([id number]) -------------------------------------------------------------------------------- Wound Assessment Details Patient Name: Richard Zavala Date of Service: 03/19/2021 2:00 PM Medical Record Number: [id number] Patient Account Number: [id number] Date of Birth/Sex: 09/09/1961 (60 y.o. M) Treating RN: Cornell Barman Primary Care Jadin Kagel: Lethea Killings Other Clinician: Referring Chandra Asher: Lethea Killings Treating Bryton Romagnoli/Extender: Tito Dine in Treatment: 1 Wound Status Wound Number: 2 Primary Diabetic Wound/Ulcer of the Lower Extremity Etiology: Wound Location: Left Lower Leg Wound Status: Open Wounding Event: Gradually Appeared Comorbid Congestive Heart Failure, Coronary Artery Disease, Date Acquired: 02/18/2021 History: Hypertension, Type II Diabetes Weeks Of Treatment: 1 Clustered Wound: No Wound Measurements % Reduction in Area: % Reduction in Volume: Epithelialization: None Wound Description Classification: Grade 2 Exudate Amount: Medium Exudate Type: Serosanguineous Exudate Color: red, brown Foul Odor After Cleansing: No Slough/Fibrino Yes Wound Bed Granulation Amount: Small (1-33%) Exposed Structure Necrotic Amount: Large (67-100%) Fascia Exposed: No Necrotic Quality: Eschar, Adherent Slough Fat Layer (Subcutaneous Tissue) Exposed: Yes Tendon Exposed: No Muscle Exposed: No Joint Exposed: No Bone Exposed: No Assessment Notes Patient came  in today with a cast on his left leg. Wound cannot be assessed at this time. Electronic Signature(s) Signed: 03/20/2021 9:44:32 AM By: Gretta Cool, BSN, RN, CWS, Kim RN, BSN Entered By: Gretta Cool, BSN, RN, CWS, Kim on 03/19/2021 14:48:32 Kerkhoven, Lorelee Market ([id number]) -------------------------------------------------------------------------------- [place] Details Patient Name: ELMO, RIO Date of Service: 03/19/2021 2:00 PM Medical Record Number: [id number] Patient Account Number: [id number] Date of Birth/Sex: [birth year] (60 y.o. M) Treating RN: Cornell Barman Primary Care Kana Reimann: Lethea Killings Other Clinician: Referring Mlissa Tamayo: Lethea Killings Treating Giavanni Zeitlin/Extender: Tito Dine in Treatment: 1 Vital Signs Time Taken: 14:15 Temperature (F): 97.9 Height (in): 68 Pulse (bpm): 63 Weight (lbs): 158 Respiratory Rate (breaths/min): 20 Body Mass Index (BMI): 24 Reference Range: 80 - 120 mg / dl Electronic Signature(s) Signed: 03/28/2021 8:11:40 AM By: Carlene Coria RN Entered By: Carlene Coria on 03/19/2021 14:39:32

## 2021-04-01 ENCOUNTER — Encounter: Payer: Medicare Other | Admitting: Physical Therapy

## 2021-04-02 ENCOUNTER — Emergency Department: Payer: Medicare Other

## 2021-04-02 ENCOUNTER — Inpatient Hospital Stay
Admission: EM | Admit: 2021-04-02 | Discharge: 2021-04-10 | DRG: 629 | Disposition: A | Discharge status: Skilled Nursing Facility | Payer: Medicare Other | Attending: Internal Medicine | Admitting: Internal Medicine

## 2021-04-02 ENCOUNTER — Other Ambulatory Visit: Payer: Self-pay

## 2021-04-02 ENCOUNTER — Inpatient Hospital Stay: Payer: Medicare Other

## 2021-04-02 ENCOUNTER — Ambulatory Visit: Payer: Medicare Other | Admitting: Internal Medicine

## 2021-04-02 DIAGNOSIS — E1165 Type 2 diabetes mellitus with hyperglycemia: Secondary | ICD-10-CM | POA: Diagnosis present

## 2021-04-02 DIAGNOSIS — N2581 Secondary hyperparathyroidism of renal origin: Secondary | ICD-10-CM | POA: Diagnosis present

## 2021-04-02 DIAGNOSIS — Z992 Dependence on renal dialysis: Secondary | ICD-10-CM | POA: Diagnosis not present

## 2021-04-02 DIAGNOSIS — Z85528 Personal history of other malignant neoplasm of kidney: Secondary | ICD-10-CM

## 2021-04-02 DIAGNOSIS — Z20822 Contact with and (suspected) exposure to covid-19: Secondary | ICD-10-CM | POA: Diagnosis present

## 2021-04-02 DIAGNOSIS — K219 Gastro-esophageal reflux disease without esophagitis: Secondary | ICD-10-CM | POA: Diagnosis present

## 2021-04-02 DIAGNOSIS — L03115 Cellulitis of right lower limb: Secondary | ICD-10-CM | POA: Diagnosis present

## 2021-04-02 DIAGNOSIS — I2510 Atherosclerotic heart disease of native coronary artery without angina pectoris: Secondary | ICD-10-CM | POA: Diagnosis present

## 2021-04-02 DIAGNOSIS — I745 Embolism and thrombosis of iliac artery: Secondary | ICD-10-CM | POA: Diagnosis present

## 2021-04-02 DIAGNOSIS — E1122 Type 2 diabetes mellitus with diabetic chronic kidney disease: Secondary | ICD-10-CM | POA: Diagnosis present

## 2021-04-02 DIAGNOSIS — S82892D Other fracture of left lower leg, subsequent encounter for closed fracture with routine healing: Secondary | ICD-10-CM | POA: Diagnosis not present

## 2021-04-02 DIAGNOSIS — E11628 Type 2 diabetes mellitus with other skin complications: Secondary | ICD-10-CM | POA: Diagnosis not present

## 2021-04-02 DIAGNOSIS — I509 Heart failure, unspecified: Secondary | ICD-10-CM | POA: Diagnosis present

## 2021-04-02 DIAGNOSIS — Z79899 Other long term (current) drug therapy: Secondary | ICD-10-CM

## 2021-04-02 DIAGNOSIS — E785 Hyperlipidemia, unspecified: Secondary | ICD-10-CM | POA: Diagnosis present

## 2021-04-02 DIAGNOSIS — E1142 Type 2 diabetes mellitus with diabetic polyneuropathy: Secondary | ICD-10-CM | POA: Diagnosis present

## 2021-04-02 DIAGNOSIS — L03119 Cellulitis of unspecified part of limb: Secondary | ICD-10-CM

## 2021-04-02 DIAGNOSIS — D631 Anemia in chronic kidney disease: Secondary | ICD-10-CM | POA: Diagnosis present

## 2021-04-02 DIAGNOSIS — N400 Enlarged prostate without lower urinary tract symptoms: Secondary | ICD-10-CM | POA: Diagnosis present

## 2021-04-02 DIAGNOSIS — Z8249 Family history of ischemic heart disease and other diseases of the circulatory system: Secondary | ICD-10-CM

## 2021-04-02 DIAGNOSIS — Z794 Long term (current) use of insulin: Secondary | ICD-10-CM

## 2021-04-02 DIAGNOSIS — Z888 Allergy status to other drugs, medicaments and biological substances status: Secondary | ICD-10-CM | POA: Diagnosis not present

## 2021-04-02 DIAGNOSIS — I132 Hypertensive heart and chronic kidney disease with heart failure and with stage 5 chronic kidney disease, or end stage renal disease: Secondary | ICD-10-CM | POA: Diagnosis present

## 2021-04-02 DIAGNOSIS — Z880 Allergy status to penicillin: Secondary | ICD-10-CM | POA: Diagnosis not present

## 2021-04-02 DIAGNOSIS — W19XXXA Unspecified fall, initial encounter: Secondary | ICD-10-CM | POA: Diagnosis present

## 2021-04-02 DIAGNOSIS — I70235 Atherosclerosis of native arteries of right leg with ulceration of other part of foot: Secondary | ICD-10-CM | POA: Diagnosis not present

## 2021-04-02 DIAGNOSIS — H547 Unspecified visual loss: Secondary | ICD-10-CM | POA: Diagnosis present

## 2021-04-02 DIAGNOSIS — E0865 Diabetes mellitus due to underlying condition with hyperglycemia: Secondary | ICD-10-CM | POA: Diagnosis not present

## 2021-04-02 DIAGNOSIS — Z8673 Personal history of transient ischemic attack (TIA), and cerebral infarction without residual deficits: Secondary | ICD-10-CM

## 2021-04-02 DIAGNOSIS — E11621 Type 2 diabetes mellitus with foot ulcer: Principal | ICD-10-CM | POA: Diagnosis present

## 2021-04-02 DIAGNOSIS — I739 Peripheral vascular disease, unspecified: Secondary | ICD-10-CM | POA: Diagnosis not present

## 2021-04-02 DIAGNOSIS — Z7982 Long term (current) use of aspirin: Secondary | ICD-10-CM

## 2021-04-02 DIAGNOSIS — I70202 Unspecified atherosclerosis of native arteries of extremities, left leg: Secondary | ICD-10-CM | POA: Diagnosis not present

## 2021-04-02 DIAGNOSIS — Z833 Family history of diabetes mellitus: Secondary | ICD-10-CM

## 2021-04-02 DIAGNOSIS — N186 End stage renal disease: Secondary | ICD-10-CM | POA: Diagnosis present

## 2021-04-02 DIAGNOSIS — Z87891 Personal history of nicotine dependence: Secondary | ICD-10-CM

## 2021-04-02 DIAGNOSIS — M62830 Muscle spasm of back: Secondary | ICD-10-CM | POA: Diagnosis not present

## 2021-04-02 DIAGNOSIS — E875 Hyperkalemia: Secondary | ICD-10-CM | POA: Diagnosis present

## 2021-04-02 DIAGNOSIS — M810 Age-related osteoporosis without current pathological fracture: Secondary | ICD-10-CM | POA: Diagnosis present

## 2021-04-02 DIAGNOSIS — L97512 Non-pressure chronic ulcer of other part of right foot with fat layer exposed: Secondary | ICD-10-CM | POA: Diagnosis present

## 2021-04-02 DIAGNOSIS — Z9111 Patient's noncompliance with dietary regimen: Secondary | ICD-10-CM | POA: Diagnosis not present

## 2021-04-02 DIAGNOSIS — E1151 Type 2 diabetes mellitus with diabetic peripheral angiopathy without gangrene: Secondary | ICD-10-CM | POA: Diagnosis present

## 2021-04-02 DIAGNOSIS — I10 Essential (primary) hypertension: Secondary | ICD-10-CM | POA: Diagnosis not present

## 2021-04-02 DIAGNOSIS — I272 Pulmonary hypertension, unspecified: Secondary | ICD-10-CM | POA: Diagnosis present

## 2021-04-02 LAB — COMPREHENSIVE METABOLIC PANEL
ALT: 16 U/L (ref 0–44)
AST: 19 U/L (ref 15–41)
Albumin: 2.6 g/dL — ABNORMAL LOW (ref 3.5–5.0)
Alkaline Phosphatase: 119 U/L (ref 38–126)
Anion gap: 13 (ref 5–15)
BUN: 39 mg/dL — ABNORMAL HIGH (ref 6–20)
CO2: 23 mmol/L (ref 22–32)
Calcium: 8.3 mg/dL — ABNORMAL LOW (ref 8.9–10.3)
Chloride: 99 mmol/L (ref 98–111)
Creatinine, Ser: 4.82 mg/dL — ABNORMAL HIGH (ref 0.61–1.24)
GFR, Estimated: 13 mL/min — ABNORMAL LOW (ref >60)
Glucose, Bld: 216 mg/dL — ABNORMAL HIGH (ref 70–99)
Potassium: 4.4 mmol/L (ref 3.5–5.1)
Sodium: 135 mmol/L (ref 135–145)
Total Bilirubin: 0.8 mg/dL (ref 0.3–1.2)
Total Protein: 6.3 g/dL — ABNORMAL LOW (ref 6.5–8.1)

## 2021-04-02 LAB — CBC WITH DIFFERENTIAL/PLATELET
Abs Immature Granulocytes: 0.02 10*3/uL (ref 0.00–0.07)
Basophils Absolute: 0.1 10*3/uL (ref 0.0–0.1)
Basophils Relative: 1 %
Eosinophils Absolute: 0.1 10*3/uL (ref 0.0–0.5)
Eosinophils Relative: 2 %
HCT: 29.5 % — ABNORMAL LOW (ref 39.0–52.0)
Hemoglobin: 9.4 g/dL — ABNORMAL LOW (ref 13.0–17.0)
Immature Granulocytes: 0 %
Lymphocytes Relative: 12 %
Lymphs Abs: 0.8 10*3/uL (ref 0.7–4.0)
MCH: 31.2 pg (ref 26.0–34.0)
MCHC: 31.9 g/dL (ref 30.0–36.0)
MCV: 98 fL (ref 80.0–100.0)
Monocytes Absolute: 0.7 10*3/uL (ref 0.1–1.0)
Monocytes Relative: 11 %
Neutro Abs: 4.9 10*3/uL (ref 1.7–7.7)
Neutrophils Relative %: 74 %
Platelets: 234 10*3/uL (ref 150–400)
RBC: 3.01 MIL/uL — ABNORMAL LOW (ref 4.22–5.81)
RDW: 17.3 % — ABNORMAL HIGH (ref 11.5–15.5)
WBC: 6.6 10*3/uL (ref 4.0–10.5)
nRBC: 0 % (ref 0.0–0.2)

## 2021-04-02 LAB — LACTIC ACID, PLASMA: Lactic Acid, Venous: 1.1 mmol/L (ref 0.5–1.9)

## 2021-04-02 LAB — CBG MONITORING, ED: Glucose-Capillary: 186 mg/dL — ABNORMAL HIGH (ref 70–99)

## 2021-04-02 MED ORDER — SODIUM CHLORIDE 0.9% FLUSH
3.0000 mL | Freq: Two times a day (BID) | INTRAVENOUS | Status: DC
  Administered 2021-04-02 – 2021-04-09 (×6): 3 mL via INTRAVENOUS

## 2021-04-02 MED ORDER — ACETAMINOPHEN 650 MG RE SUPP: 650.0000 mg | Freq: Four times a day (QID) | RECTAL | Status: DC | PRN

## 2021-04-02 MED ORDER — ALBUTEROL SULFATE HFA 108 (90 BASE) MCG/ACT IN AERS
2.0000 | INHALATION_SPRAY | Freq: Four times a day (QID) | RESPIRATORY_TRACT | Status: DC | PRN
  Filled 2021-04-02: qty 6.7

## 2021-04-02 MED ORDER — INSULIN ASPART 100 UNIT/ML ~~LOC~~ SOLN
0.0000 [IU] | Freq: Three times a day (TID) | SUBCUTANEOUS | Status: DC
  Administered 2021-04-02 – 2021-04-03 (×2): 3 [IU] via SUBCUTANEOUS
  Administered 2021-04-03: 5 [IU] via SUBCUTANEOUS
  Administered 2021-04-04: 8 [IU] via SUBCUTANEOUS
  Administered 2021-04-05 – 2021-04-07 (×4): 3 [IU] via SUBCUTANEOUS
  Administered 2021-04-08: 5 [IU] via SUBCUTANEOUS
  Filled 2021-04-02 (×10): qty 1

## 2021-04-02 MED ORDER — SODIUM CHLORIDE 0.9 % IV SOLN
1.0000 g | INTRAVENOUS | Status: DC
  Administered 2021-04-03 – 2021-04-04 (×2): 1 g via INTRAVENOUS
  Filled 2021-04-02 (×6): qty 1

## 2021-04-02 MED ORDER — CARVEDILOL 12.5 MG PO TABS
12.5000 mg | ORAL_TABLET | Freq: Two times a day (BID) | ORAL | Status: DC
  Administered 2021-04-02 – 2021-04-03 (×2): 12.5 mg via ORAL
  Filled 2021-04-02: qty 1
  Filled 2021-04-02: qty 2

## 2021-04-02 MED ORDER — SODIUM CHLORIDE 0.9 % IV SOLN: 2.0000 g | Freq: Once | INTRAVENOUS | Status: DC

## 2021-04-02 MED ORDER — OXYCODONE HCL 5 MG PO TABS
5.0000 mg | ORAL_TABLET | ORAL | Status: DC | PRN
  Administered 2021-04-06 – 2021-04-08 (×4): 5 mg via ORAL
  Filled 2021-04-02 (×5): qty 1

## 2021-04-02 MED ORDER — ACETAMINOPHEN 325 MG PO TABS
650.0000 mg | ORAL_TABLET | Freq: Four times a day (QID) | ORAL | Status: DC | PRN
  Administered 2021-04-03 – 2021-04-06 (×5): 650 mg via ORAL
  Filled 2021-04-02 (×5): qty 2

## 2021-04-02 MED ORDER — HEPARIN SODIUM (PORCINE) 5000 UNIT/ML IJ SOLN
5000.0000 [IU] | Freq: Two times a day (BID) | INTRAMUSCULAR | Status: DC
  Administered 2021-04-03 – 2021-04-10 (×14): 5000 [IU] via SUBCUTANEOUS
  Filled 2021-04-02 (×15): qty 1

## 2021-04-02 MED ORDER — INSULIN GLARGINE 100 UNIT/ML ~~LOC~~ SOLN
10.0000 [IU] | Freq: Every day | SUBCUTANEOUS | Status: DC
  Filled 2021-04-02 (×4): qty 0.1

## 2021-04-02 MED ORDER — ONDANSETRON HCL 4 MG PO TABS
4.0000 mg | ORAL_TABLET | Freq: Four times a day (QID) | ORAL | Status: DC | PRN
  Administered 2021-04-06 – 2021-04-08 (×2): 4 mg via ORAL
  Filled 2021-04-02 (×3): qty 1

## 2021-04-02 MED ORDER — TRAZODONE HCL 50 MG PO TABS
25.0000 mg | ORAL_TABLET | Freq: Every evening | ORAL | Status: DC | PRN
  Administered 2021-04-05 – 2021-04-09 (×3): 25 mg via ORAL
  Filled 2021-04-02 (×3): qty 1

## 2021-04-02 MED ORDER — SODIUM CHLORIDE 0.9 % IV SOLN: 250.0000 mL | INTRAVENOUS | Status: DC | PRN

## 2021-04-02 MED ORDER — MAGNESIUM HYDROXIDE 400 MG/5ML PO SUSP
30.0000 mL | Freq: Every day | ORAL | Status: DC | PRN
  Administered 2021-04-09: 30 mL via ORAL
  Filled 2021-04-02: qty 30

## 2021-04-02 MED ORDER — MORPHINE SULFATE (PF) 2 MG/ML IV SOLN: 2.0000 mg | INTRAVENOUS | Status: DC | PRN

## 2021-04-02 MED ORDER — SODIUM CHLORIDE 0.9% FLUSH: 3.0000 mL | INTRAVENOUS | Status: DC | PRN

## 2021-04-02 MED ORDER — ENOXAPARIN SODIUM 30 MG/0.3ML ~~LOC~~ SOLN
30.0000 mg | SUBCUTANEOUS | Status: DC
  Filled 2021-04-02: qty 0.3

## 2021-04-02 MED ORDER — VANCOMYCIN HCL 1500 MG/300ML IV SOLN
1500.0000 mg | Freq: Once | INTRAVENOUS | Status: AC
  Administered 2021-04-03: 1500 mg via INTRAVENOUS
  Filled 2021-04-02 (×3): qty 300

## 2021-04-02 MED ORDER — ONDANSETRON HCL 4 MG/2ML IJ SOLN: 4.0000 mg | Freq: Four times a day (QID) | INTRAMUSCULAR | Status: DC | PRN

## 2021-04-02 MED ORDER — HEPARIN SODIUM (PORCINE) 5000 UNIT/ML IJ SOLN: 5000.0000 [IU] | Freq: Three times a day (TID) | INTRAMUSCULAR | Status: DC

## 2021-04-02 MED ORDER — ATORVASTATIN CALCIUM 20 MG PO TABS
80.0000 mg | ORAL_TABLET | Freq: Every day | ORAL | Status: DC
  Administered 2021-04-02 – 2021-04-09 (×8): 80 mg via ORAL
  Filled 2021-04-02 (×9): qty 4

## 2021-04-02 MED ORDER — SODIUM CHLORIDE 0.9 % IV SOLN
2.0000 g | Freq: Once | INTRAVENOUS | Status: DC
  Filled 2021-04-02: qty 2

## 2021-04-02 MED ORDER — PANTOPRAZOLE SODIUM 40 MG PO TBEC
40.0000 mg | DELAYED_RELEASE_TABLET | Freq: Two times a day (BID) | ORAL | Status: DC
  Administered 2021-04-03 – 2021-04-10 (×13): 40 mg via ORAL
  Filled 2021-04-02 (×13): qty 1

## 2021-04-02 MED ORDER — VANCOMYCIN HCL IN DEXTROSE 1-5 GM/200ML-% IV SOLN: 1000.0000 mg | Freq: Once | INTRAVENOUS | Status: DC

## 2021-04-02 MED ORDER — LIDOCAINE 5 % EX PTCH
1.0000 | MEDICATED_PATCH | Freq: Every day | CUTANEOUS | Status: DC
  Administered 2021-04-03 – 2021-04-10 (×7): 1 via TRANSDERMAL
  Filled 2021-04-02 (×9): qty 1

## 2021-04-02 MED ORDER — VANCOMYCIN HCL IN DEXTROSE 1-5 GM/200ML-% IV SOLN
1000.0000 mg | Freq: Once | INTRAVENOUS | Status: DC
  Filled 2021-04-02: qty 200

## 2021-04-02 NOTE — H&P (Signed)
[name]   PATIENT NAME: Richard Zavala    MR#:  [id number]  DATE OF BIRTH:  1961-09-20  DATE OF ADMISSION:  04/02/2021  PRIMARY CARE PHYSICIAN: Midge Minium, PA   Patient is coming from: Home  REQUESTING/REFERRING PHYSICIAN: Blake Divine, MD  CHIEF COMPLAINT:   Chief Complaint  Patient presents with  . Leg Swelling    HISTORY OF PRESENT ILLNESS:  Richard Zavala is a 60 y.o. Caucasian male with medical history significant for multiple medical problems that are mentioned below including CHF, coronary artery disease, end-stage renal disease on hemodialysis, hypertension, dyslipidemia, peripheral neuropathy, CVA and BPH, who presented to the ER with acute onset of worsening right foot swelling with erythema and mild extension to the right leg for last 4 days.  The patient had ORIF of the left leg during recent admission here and was discharged on 03/25/2021.  He has a right foot ulcer and scattered healed ulcers over his foot and leg.  He denied any fever or chills.  He has no problems currently from his left leg.  Earlier today he finished his session of hemodialysis that he gets on Monday, Wednesday and Fridays.  No chest pain or palpitations.  No reported cough or wheezing or dyspnea.  ED Course: Upon presentation to the ER, temperature was 100 with a blood pressure 137/99 with otherwise normal vital signs.  Labs revealed blood glucose of 216 and a BUN of 39 with creatinine 4.82 with anion gap of 13.  Albumin was 2.6.  Lactic acid was 1.1.  CBC showed anemia close to his baseline.  Blood culture was drawn.  Imaging: Two-view right foot x-ray showed forefoot soft tissue swelling and dorsal ulceration with no acute or destructive bony lesions.  It did show multifocal osteoarthritis and osteopenia.Venous duplex of the right lower extremity showed no evidence for DVT.  The patient was given IV cefepime and vancomycin.  He will be admitted to a medical bed for further  evaluation and management. PAST MEDICAL HISTORY:   Past Medical History:  Diagnosis Date  . Anemia   . Blind   . BPH (benign prostatic hyperplasia)   . CAD (coronary artery disease)   . CHF (congestive heart failure) ([place])   . Chronic kidney disease (CKD), stage IV (severe) (H[facility])    DIALYSIS  . Diabetes mellitus without complication ([place])   . ESRD (end stage renal disease) ([place])    Monday-Wednesday-Friday dialysis  . Hyperlipemia   . Hypertension   . Neuropathy   . Osteoporosis   . Pulmonary HTN ([place])   . Stroke [facility])    2013  . TIA (transient ischemic attack)   . TRD (traction retinal detachment)   . Wilms' tumor [facility])     PAST SURGICAL HISTORY:   Past Surgical History:  Procedure Laterality Date  . A/V FISTULAGRAM Left 01/28/2017   Procedure: A/V Fistulagram;  Surgeon: Algernon Huxley, MD;  Location: [place] [facility];  Service: Cardiovascular;  Laterality: Left;  . A/V FISTULAGRAM Left 04/28/2018   Procedure: A/V FISTULAGRAM;  Surgeon: Algernon Huxley, MD;  Location: [place] [facility];  Service: Cardiovascular;  Laterality: Left;  . A/V FISTULAGRAM N/A 03/21/2020   Procedure: A/V FISTULAGRAM;  Surgeon: Algernon Huxley, MD;  Location: [place] [facility];  Service: Cardiovascular;  Laterality: N/A;  . AV FISTULA PLACEMENT    . AV FISTULA PLACEMENT Right 11/23/2019   Procedure: ARTERIOVENOUS (AV) FISTULA CREATION (RADIOCEPHALIC );  Surgeon:  Algernon Huxley, MD;  Location: [facility];  Service: Vascular;  Laterality: Right;  . CARDIAC CATHETERIZATION    . CATARACT EXTRACTION W/PHACO Right 05/14/2016   Procedure: CATARACT EXTRACTION PHACO AND INTRAOCULAR LENS PLACEMENT (IOC);  Surgeon: Eulogio Bear, MD;  Location: [facility];  Service: Ophthalmology;  Laterality: Right;  [place] 1.46 AP% 11.5 CDE 12.33 FLUID PACK LOT # [id number]  . EYE SURGERY    . FRACTURE SURGERY     RIGHT LEG WITH ROD  . HIP SURGERY    . NEPHRECTOMY RADICAL    . PERIPHERAL VASCULAR CATHETERIZATION N/A 08/28/2015    Procedure: A/V Shuntogram/Fistulagram;  Surgeon: Algernon Huxley, MD;  Location: [place] [facility];  Service: Cardiovascular;  Laterality: N/A;  . PERIPHERAL VASCULAR CATHETERIZATION Left 08/28/2015   Procedure: A/V Shunt Intervention;  Surgeon: Algernon Huxley, MD;  Location: [place] [facility];  Service: Cardiovascular;  Laterality: Left;  . TIBIA IM NAIL INSERTION Left 03/21/2021   Procedure: INTRAMEDULLARY (IM) NAIL TIBIAL;  Surgeon: Thornton Park, MD;  Location: [facility];  Service: Orthopedics;  Laterality: Left;  . UPPER EXTREMITY ANGIOGRAPHY Right 08/01/2020   Procedure: UPPER EXTREMITY ANGIOGRAPHY;  Surgeon: Algernon Huxley, MD;  Location: [place] [facility];  Service: Cardiovascular;  Laterality: Right;    SOCIAL HISTORY:   Social History   Tobacco Use  . Smoking status: Former Smoker    Packs/day: 1.00    Types: Cigarettes    Quit date: 06/26/2012    Years since quitting: 8.7  . Smokeless tobacco: Never Used  Substance Use Topics  . Alcohol use: No    FAMILY HISTORY:   Family History  Problem Relation Age of Onset  . CAD Father   . COPD Father   . CAD Paternal Grandfather   . Diabetes Maternal Aunt   . Diabetes Maternal Uncle     DRUG ALLERGIES:   Allergies  Allergen Reactions  . Metformin Other (See Comments)    Severe diarrhea   . Penicillins Hives and Other (See Comments)    Has patient had a PCN reaction causing immediate rash, facial/tongue/throat swelling, SOB or lightheadedness with hypotension:Yes Has patient had a PCN reaction causing severe rash involving mucus membranes or skin necrosis:Yes Has patient had a PCN reaction that required hospitalization:inpatient at the time of reaction. Has patient had a PCN reaction occurring within the last 10 years:No If all of the above answers are "NO", then may proceed with Cephalosporin use.     REVIEW OF SYSTEMS:   ROS As per history of present illness. All pertinent systems were reviewed above. Constitutional,  HEENT, cardiovascular, respiratory, GI, GU, musculoskeletal, neuro, psychiatric, endocrine, integumentary and hematologic systems were reviewed and are otherwise negative/unremarkable except for positive findings mentioned above in the HPI.   MEDICATIONS AT HOME:   Prior to Admission medications   Medication Sig Start Date End Date Taking? Authorizing Provider  albuterol (VENTOLIN HFA) 108 (90 Base) MCG/ACT inhaler Inhale 2 puffs into the lungs every 6 (six) hours as needed. 02/21/21   [provider]  aspirin EC 81 MG tablet Take 1 tablet (81 mg total) by mouth 2 (two) times daily. Swallow whole. 03/25/21 03/25/22  Thornton Park, MD  atorvastatin (LIPITOR) 80 MG tablet Take 1 tablet (80 mg total) by mouth at bedtime. 02/08/21   Enzo Bi, MD  carvedilol (COREG) 12.5 MG tablet Take 1 tablet (12.5 mg total) by mouth 2 (two) times daily. 02/16/21   Enzo Bi, MD  epoetin alfa (  EPOGEN) 10000 UNIT/ML injection Inject 0.8 mLs (8,000 Units total) into the vein Every Tuesday,Thursday,and Saturday with dialysis. 02/16/21   Enzo Bi, MD  furosemide (LASIX) 40 MG tablet Hold until followup with outpatient doctor due to intermittent low blood pressure. 02/16/21   Enzo Bi, MD  insulin glargine (LANTUS) 100 UNIT/ML injection Inject 9-10 Units into the skin daily.     [provider]  lidocaine (LIDODERM) 5 % Place 1 patch onto the skin daily. 11/04/20   [provider]  oxyCODONE (OXY IR/ROXICODONE) 5 MG immediate release tablet Take 1 tablet (5 mg total) by mouth every 4 (four) hours as needed for moderate pain (pain score 4-6). 03/25/21   Thornton Park, MD  pantoprazole (PROTONIX) 40 MG tablet Take 40 mg by mouth 2 (two) times daily before a meal. 01/14/21   [provider]      VITAL SIGNS:  Blood pressure (!) 137/99, pulse 75, temperature 98.7 F (37.1 C), temperature source Oral, resp. rate 17, height 5\' 8"  (1.727 m), weight 71 kg, SpO2 95 %.  PHYSICAL EXAMINATION:   Physical Exam  GENERAL:  60 y.o.-year-old Caucasian male patient lying in the bed with no acute distress.  EYES: Pupils equal, round, reactive to light and accommodation. No scleral icterus. Extraocular muscles intact.  HEENT: Head atraumatic, normocephalic. Oropharynx and nasopharynx clear.  NECK:  Supple, no jugular venous distention. No thyroid enlargement, no tenderness.  LUNGS: Normal breath sounds bilaterally, no wheezing, rales,rhonchi or crepitation. No use of accessory muscles of respiration.  CARDIOVASCULAR: Regular rate and rhythm, S1, S2 normal. No murmurs, rubs, or gallops.  ABDOMEN: Soft, nondistended, nontender. Bowel sounds present. No organomegaly or mass.  EXTREMITIES: Wrapped left leg status post ORIF of tib-fib, no cyanosis, or clubbing.  NEUROLOGIC: Cranial nerves II through XII are intact. Muscle strength 5/5 in all extremities. Sensation intact. Gait not checked.  PSYCHIATRIC: The patient is alert and oriented x 3.  Normal affect and good eye contact. SKIN: Right foot and leg swelling with dorsal foot erythema and ulceration and several scabbed areas of skin tears and ulcerations over the foot and the leg with associated distal foot tenderness and warmth    LABORATORY PANEL:   CBC Recent Labs  Lab 04/02/21 1801  WBC 6.6  HGB 9.4*  HCT 29.5*  PLT 234   ------------------------------------------------------------------------------------------------------------------  Chemistries  Recent Labs  Lab 04/02/21 1801  NA 135  K 4.4  CL 99  CO2 23  GLUCOSE 216*  BUN 39*  CREATININE 4.82*  CALCIUM 8.3*  AST 19  ALT 16  ALKPHOS 119  BILITOT 0.8   ------------------------------------------------------------------------------------------------------------------  Cardiac Enzymes No results for input(s): TROPONINI in the last 168  hours. ------------------------------------------------------------------------------------------------------------------  RADIOLOGY:  DG Foot 2 Views Right  Result Date: 04/02/2021 CLINICAL DATA:  Right foot swelling greatest at first digit EXAM: RIGHT FOOT - 2 VIEW COMPARISON:  None. FINDINGS: Frontal and lateral views of the right foot are obtained. The bones are diffusely osteopenic. Prior healed second through fifth metatarsal fractures. No acute bony abnormalities. There is diffuse interphalangeal joint space narrowing, as well as prominent joint space narrowing and osteophyte formation of the first metatarsophalangeal joint, most consistent with osteoarthritis. Large inferior calcaneal spur. Ulceration dorsal aspect of the forefoot. Diffuse soft tissue swelling of the forefoot greatest at the first digit. IMPRESSION: 1. Forefoot soft tissue swelling and dorsal ulceration. 2. No acute or destructive bony lesions. 3. Multifocal osteoarthritis. 4. Osteopenia. Electronically Signed   By: Diana Eves.D.  On: 04/02/2021 19:04      IMPRESSION AND PLAN:  Active Problems:   Cellulitis in diabetic foot ([place])  1.  Diabetic right foot moderate nonpurulent cellulitis secondary to infected ulcer in the setting of immunosuppression with diabetes mellitus. -The patient will be admitted to a medical bed. -We will continue IV antibiotic therapy with IV cefepime and vancomycin. -Pain management will be provided. -Podiatry consult will be obtained.  2.  Type II diabetes mellitus with mild hyperglycemia. -The patient will be placed on supplement coverage with NovoLog I will continue his basal coverage.  3.  End-stage renal disease on hemodialysis. -Nephrology consultation will be obtained for follow-up. -Dr. Holley Raring was notified about the patient.  4.  Dyslipidemia. -We will continue statin therapy.  5.  Essential hypertension. -We will continue his Coreg and Norvasc.  6.  GERD. -We will  continue PPI therapy.  7.  Coronary artery disease. -We will continue beta-blocker therapy, Imdur and aspirin as well as statin therapy.  DVT prophylaxis: Subcutaneous heparin. Code Status: full code. Family Communication:  The plan of care was discussed in details with the patient (and family). I answered all questions. The patient agreed to proceed with the above mentioned plan. Further management will depend upon hospital course. Disposition Plan: Back to previous home environment Consults called: Nephrology and podiatry consults. All the records are reviewed and case discussed with ED provider.  Status is: Inpatient  Remains inpatient appropriate because:Ongoing active pain requiring inpatient pain management, Ongoing diagnostic testing needed not appropriate for outpatient work up, Unsafe d/c plan, IV treatments appropriate due to intensity of illness or inability to take PO and Inpatient level of care appropriate due to severity of illness   Dispo: The patient is from: Home              Anticipated d/c is to: Home              Patient currently is not medically stable to d/c.   Difficult to place patient No   TOTAL TIME TAKING CARE OF THIS PATIENT: 55 minutes.    Christel Mormon M.D on 04/02/2021 at 8:33 PM  [facility]   From 7 PM-7 AM, contact night-coverage [email or web address]  CC: Primary care physician; Midge Minium, PA

## 2021-04-02 NOTE — ED Notes (Signed)
ED Provider at bedside. 

## 2021-04-02 NOTE — ED Notes (Signed)
Phlebotomist at bedside.

## 2021-04-02 NOTE — ED Notes (Signed)
This RN unsuccessful at blood specimen collection. Lab called to request a phlebotomist.

## 2021-04-02 NOTE — Consult Note (Signed)
Pharmacy Antibiotic Note  Richard Zavala is a 60 y.o. male admitted on 04/02/2021 with cellulitis.  Pharmacy has been consulted for vancomycin and cefepime dosing.  Plan: Vancomycin 1500 mg IV x 1 loading dose Will follow up with HD schedule and order vancomycin 750 mg IV post-HD sessions Check vancomycin level around 3rd HD session  Cefepime 1 g IV q24h  May adjust to 2 g IV qHD once HD schedule known   Height: 5\' 8"  (172.7 cm) Weight: 71 kg (156 lb 8.4 oz) IBW/kg (Calculated) : 68.4  Temp (24hrs), Avg:99.4 F (37.4 C), Min:98.7 F (37.1 C), Max:100 F (37.8 C)  Recent Labs  Lab 04/02/21 1801  WBC 6.6  CREATININE 4.82*  LATICACIDVEN 1.1    Estimated Creatinine Clearance: 16.2 mL/min (A) (by C-G formula based on SCr of 4.82 mg/dL (H)).    Allergies  Allergen Reactions  . Metformin Other (See Comments)    Severe diarrhea   . Penicillins Hives and Other (See Comments)    Has patient had a PCN reaction causing immediate rash, facial/tongue/throat swelling, SOB or lightheadedness with hypotension:Yes Has patient had a PCN reaction causing severe rash involving mucus membranes or skin necrosis:Yes Has patient had a PCN reaction that required hospitalization:inpatient at the time of reaction. Has patient had a PCN reaction occurring within the last 10 years:No If all of the above answers are "NO", then may proceed with Cephalosporin use.     Antimicrobials this admission: Cefepime 4/13 >>  Vancomycin 4/13 >>   Dose adjustments this admission:  Microbiology results: 4/13 BCx: sent   Thank you for allowing pharmacy to be a part of this patient's care.  Darnelle Bos, PharmD 04/02/2021 8:49 PM

## 2021-04-02 NOTE — ED Provider Notes (Signed)
[facility] Emergency Department Provider Note   ____________________________________________   Event Date/Time   First MD Initiated Contact with Patient 04/02/21 1751     (approximate)  I have reviewed the triage vital signs and the nursing notes.   HISTORY  Chief Complaint Leg Swelling    HPI KENYADA HY is a 60 y.o. male with past medical history of hypertension, hyperlipidemia, diabetes, stroke, CAD, and ESRD on HD (MWF) who presents to the ED complaining of leg swelling.  Patient reports that he has been dealing with increasing swelling in his right lower extremity for the past 4 days after having surgery to fix a fracture in his left leg.  He denies any associated pain in his leg but he does have a wound to the top of his right foot.  He denies any fevers and has not noticed any redness spreading from the wound on his foot.  He is unsure exactly how long the wound has been present, states he is not currently on antibiotics.  He states that his left leg seems to be healing well and he denies significant pain or swelling in that leg.  He had a full run of dialysis earlier today and denies any chest pain or shortness of breath.        Past Medical History:  Diagnosis Date  . Anemia   . Blind   . BPH (benign prostatic hyperplasia)   . CAD (coronary artery disease)   . CHF (congestive heart failure) ([place])   . Chronic kidney disease (CKD), stage IV (severe) (HCC)    DIALYSIS  . Diabetes mellitus without complication ([place])   . ESRD (end stage renal disease) ([place])    Monday-Wednesday-Friday dialysis  . Hyperlipemia   . Hypertension   . Neuropathy   . Osteoporosis   . Pulmonary HTN ([place])   . Stroke [facility]    2013  . TIA (transient ischemic attack)   . TRD (traction retinal detachment)   . Wilms' tumor [facility])     Patient Active Problem List   Diagnosis Date Noted  . Cellulitis in diabetic foot ([place]) 04/02/2021  . Tibia/fibula fracture, left,  closed, with delayed healing, subsequent encounter 03/20/2021  . Sepsis ([place]) 02/12/2021  . Acute metabolic encephalopathy 62/69/4854  . Acute respiratory failure with hypoxia ([place]) 02/12/2021  . Anemia in ESRD (end-stage renal disease) ([place]) 02/12/2021  . HLD (hyperlipidemia) 02/12/2021  . Stroke ([place]) 02/12/2021  . Hypothermia 02/12/2021  . CAP (community acquired pneumonia) 02/12/2021  . ESRD on hemodialysis ([place])   . Acute cystitis 02/06/2021  . Acute upper GI bleed 02/06/2021  . Acute blood loss anemia 02/06/2021  . COVID-19 virus infection 02/06/2021  . Gait instability 10/12/2019  . Pulmonary nodule 10/12/2019  . CAD (coronary artery disease), native coronary artery 03/08/2018  . Hyperlipidemia 03/08/2018  . Essential hypertension 03/08/2018  . Wilms' tumor with favorable history 03/01/2018  . Closed fracture of right distal radius 08/22/2016  . TIA (transient ischemic attack) 08/21/2016  . Encounter for pre-transplant evaluation for kidney transplant 05/17/2016  . Anemia due to other cause   . Hyperkalemia   . Hypoglycemia   . Pain   . End stage renal disease ([place])   . Weakness   . Other chest pain   . Elevated troponin   . Hyperkalemia, diminished renal excretion 08/14/2015  . Hypertensive urgency 03/26/2015  . Anemia in CKD (chronic kidney disease) 12/04/2014  . Chronic kidney disease, stage IV (severe) ([place]) 12/03/2014  .  Acute ischemic stroke ([place]) 08/25/2014  . Acute lacunar stroke ([place]) 06/25/2014  . Orthostatic hypotension 06/25/2014  . Diabetic vitreous hemorrhage associated with type 2 diabetes mellitus ([place]) 09/28/2013  . PDR (proliferative diabetic retinopathy) ([place]) 09/28/2013  . History of Wilms' tumor 08/15/2013  . Iron deficiency anemia 07/21/2013  . Mesenteric artery stenosis ([place]) 03/27/2013  . Heart failure, chronic systolic ([place]) 61/44/3154  . Ischemic cardiomyopathy 03/13/2013  . Pulmonary HTN ([place]) 03/13/2013  . Peripheral edema 02/28/2013   . Diabetic macular edema of both eyes ([place]) 01/16/2013  . Corneal epithelial defect 10/06/2012  . TRD (traction retinal detachment) 09/22/2012  . Vitreous hemorrhage of both eyes ([place]) 07/21/2012  . Closed right hip fracture ([place]) 02/19/2012  . Diabetic sensorimotor polyneuropathy ([place]) 01/27/2012  . Vitamin D deficiency disease 11/30/2011  . Type II diabetes mellitus with renal manifestations ([place]) 07/03/2011  . Type 2 diabetes mellitus with kidney complication, with long-term current use of insulin ([place]) 07/03/2011  . Osteoporosis with fracture 05/28/2011    Past Surgical History:  Procedure Laterality Date  . A/V FISTULAGRAM Left 01/28/2017   Procedure: A/V Fistulagram;  Surgeon: Algernon Huxley, MD;  Location: [place] [facility];  Service: Cardiovascular;  Laterality: Left;  . A/V FISTULAGRAM Left 04/28/2018   Procedure: A/V FISTULAGRAM;  Surgeon: Algernon Huxley, MD;  Location: [place] [facility];  Service: Cardiovascular;  Laterality: Left;  . A/V FISTULAGRAM N/A 03/21/2020   Procedure: A/V FISTULAGRAM;  Surgeon: Algernon Huxley, MD;  Location: [place] [facility];  Service: Cardiovascular;  Laterality: N/A;  . AV FISTULA PLACEMENT    . AV FISTULA PLACEMENT Right 11/23/2019   Procedure: ARTERIOVENOUS (AV) FISTULA CREATION (RADIOCEPHALIC );  Surgeon: Algernon Huxley, MD;  Location: [facility] ORS;  Service: Vascular;  Laterality: Right;  . CARDIAC CATHETERIZATION    . CATARACT EXTRACTION W/PHACO Right 05/14/2016   Procedure: CATARACT EXTRACTION PHACO AND INTRAOCULAR LENS PLACEMENT (IOC);  Surgeon: Eulogio Bear, MD;  Location: [facility] OR[facility];  Service: Ophthalmology;  Laterality: Right;  [place] 1.46 AP% 11.5 CDE 12.33 FLUID PACK LOT # [id number] H  . EYE SURGERY    . FRACTURE SURGERY     RIGHT LEG WITH ROD  . HIP SURGERY    . NEPHRECTOMY RADICAL    . PERIPHERAL VASCULAR CATHETERIZATION N/A 08/28/2015   Procedure: A/V Shuntogram/Fistulagram;  Surgeon: Algernon Huxley, MD;  Location: [place] [facility];   Service: Cardiovascular;  Laterality: N/A;  . PERIPHERAL VASCULAR CATHETERIZATION Left 08/28/2015   Procedure: A/V Shunt Intervention;  Surgeon: Algernon Huxley, MD;  Location: [place] [facility];  Service: Cardiovascular;  Laterality: Left;  . TIBIA IM NAIL INSERTION Left 03/21/2021   Procedure: INTRAMEDULLARY (IM) NAIL TIBIAL;  Surgeon: Thornton Park, MD;  Location: [facility] ORS;  Service: Orthopedics;  Laterality: Left;  . UPPER EXTREMITY ANGIOGRAPHY Right 08/01/2020   Procedure: UPPER EXTREMITY ANGIOGRAPHY;  Surgeon: Algernon Huxley, MD;  Location: [place] [facility];  Service: Cardiovascular;  Laterality: Right;    Prior to Admission medications   Medication Sig Start Date End Date Taking? Authorizing Provider  albuterol (VENTOLIN HFA) 108 (90 Base) MCG/ACT inhaler Inhale 2 puffs into the lungs every 6 (six) hours as needed. 02/21/21   [provider]  aspirin EC 81 MG tablet Take 1 tablet (81 mg total) by mouth 2 (two) times daily. Swallow whole. 03/25/21 03/25/22  Thornton Park, MD  atorvastatin (LIPITOR) 80 MG tablet Take 1 tablet (80 mg total) by mouth at bedtime. 02/08/21  Enzo Bi, MD  carvedilol (COREG) 12.5 MG tablet Take 1 tablet (12.5 mg total) by mouth 2 (two) times daily. 02/16/21   Enzo Bi, MD  epoetin alfa (EPOGEN) 10000 UNIT/ML injection Inject 0.8 mLs (8,000 Units total) into the vein Every Tuesday,Thursday,and Saturday with dialysis. 02/16/21   Enzo Bi, MD  furosemide (LASIX) 40 MG tablet Hold until followup with outpatient doctor due to intermittent low blood pressure. 02/16/21   Enzo Bi, MD  insulin glargine (LANTUS) 100 UNIT/ML injection Inject 9-10 Units into the skin daily.     [provider]  lidocaine (LIDODERM) 5 % Place 1 patch onto the skin daily. 11/04/20   [provider]  oxyCODONE (OXY IR/ROXICODONE) 5 MG immediate release tablet Take 1 tablet (5 mg total) by mouth every 4 (four) hours as needed for moderate pain (pain score 4-6). 03/25/21    Thornton Park, MD  pantoprazole (PROTONIX) 40 MG tablet Take 40 mg by mouth 2 (two) times daily before a meal. 01/14/21   [provider]    Allergies Metformin and Penicillins  Family History  Problem Relation Age of Onset  . CAD Father   . COPD Father   . CAD Paternal Grandfather   . Diabetes Maternal Aunt   . Diabetes Maternal Uncle     Social History Social History   Tobacco Use  . Smoking status: Former Smoker    Packs/day: 1.00    Types: Cigarettes    Quit date: 06/26/2012    Years since quitting: 8.7  . Smokeless tobacco: Never Used  Vaping Use  . Vaping Use: Never used  Substance Use Topics  . Alcohol use: No  . Drug use: No    Review of Systems  Constitutional: No fever/chills Eyes: No visual changes. ENT: No sore throat. Cardiovascular: Denies chest pain. Respiratory: Denies shortness of breath. Gastrointestinal: No abdominal pain.  No nausea, no vomiting.  No diarrhea.  No constipation. Genitourinary: Negative for dysuria. Musculoskeletal: Negative for back pain.  Positive for right leg swelling. Skin: Negative for rash.  Positive for foot wound. Neurological: Negative for headaches, focal weakness or numbness.  ____________________________________________   PHYSICAL EXAM:  VITAL SIGNS: ED Triage Vitals  Enc Vitals Group     BP      Pulse      Resp      Temp      Temp src      SpO2      Weight      Height      Head Circumference      Peak Flow      Pain Score      Pain Loc      Pain Edu?      Excl. in [place]?     Constitutional: Alert and oriented. Eyes: Conjunctivae are normal. Head: Atraumatic. Nose: No congestion/rhinnorhea. Mouth/Throat: Mucous membranes are moist. Neck: Normal ROM Cardiovascular: Normal rate, regular rhythm. Grossly normal heart sounds.  AV fistulas to bilateral forearms.  2+ DP pulse on right. Respiratory: Normal respiratory effort.  No retractions. Lungs CTAB. Gastrointestinal: Soft and nontender. No  distention. Genitourinary: deferred Musculoskeletal: Cast in place to left lower extremity, range of motion intact to left toes with cap refill less than 2 seconds.  1+ pitting edema to right lower extremity. Neurologic:  Normal speech and language. No gross focal neurologic deficits are appreciated. Skin:  Skin is warm and dry.  Approximately 1 cm in diameter wound to the dorsum of right foot with central  purulence and mild surrounding erythema. Psychiatric: Mood and affect are normal. Speech and behavior are normal.  ____________________________________________   LABS (all labs ordered are listed, but only abnormal results are displayed)  Labs Reviewed  CBC WITH DIFFERENTIAL/PLATELET - Abnormal; Notable for the following components:      Result Value   RBC 3.01 (*)    Hemoglobin 9.4 (*)    HCT 29.5 (*)    RDW 17.3 (*)    All other components within normal limits  COMPREHENSIVE METABOLIC PANEL - Abnormal; Notable for the following components:   Glucose, Bld 216 (*)    BUN 39 (*)    Creatinine, Ser 4.82 (*)    Calcium 8.3 (*)    Total Protein 6.3 (*)    Albumin 2.6 (*)    GFR, Estimated 13 (*)    All other components within normal limits  CULTURE, BLOOD (ROUTINE X 2)  CULTURE, BLOOD (ROUTINE X 2)  SARS CORONAVIRUS 2 (TAT 6-24 HRS)  LACTIC ACID, PLASMA     PROCEDURES  Procedure(s) performed (including Critical Care):  Procedures   ____________________________________________   INITIAL IMPRESSION / ASSESSMENT AND PLAN / ED COURSE       60 year old male with past medical history of hypertension, hyperlipidemia, diabetes, CAD, CHF, stroke, and ESRD on HD (MWF) who presents to the ED complaining of increasing swelling in his right lower extremity following operative repair of left lower extremity fracture.  He is neurovascularly intact to his bilateral lower extremities, does have some mild edema to the right lower extremity.  Given his recent orthopedic surgery, we will  further assess for DVT with ultrasound.  He also has a wound to the dorsum of his right foot, given his history of diabetes we will check x-ray to check for evidence of osteomyelitis.  Patient does have borderline fever with temperature of 100, we will check blood cultures and lactate but he is overall well-appearing and low suspicion for sepsis at this time.  Foot x-ray reviewed by me and shows no obvious osteomyelitis but did show soft tissue swelling.  Labs are reassuring but given patient's history of diabetes and ESRD, we will treat with broad-spectrum antibiotics.  Case discussed with hospitalist for admission.      ____________________________________________   FINAL CLINICAL IMPRESSION(S) / ED DIAGNOSES  Final diagnoses:  Cellulitis in diabetic foot ([place])  ESRD (end stage renal disease) [facility])     ED Discharge Orders    None       Note:  This document was prepared using Dragon voice recognition software and may include unintentional dictation errors.   Blake Divine, MD 04/02/21 2022

## 2021-04-02 NOTE — ED Notes (Signed)
Patient transported to Ultrasound 

## 2021-04-02 NOTE — ED Triage Notes (Addendum)
Pt comes EMS from [facility]. Facility made pt sign out AMA to be seen here for leg swelling after tibial fracture. Right foot also swelling around big toe. Pt does have wound on top of right foot that is currently covered but has some redness around the edges. EMS reports stomach feels tight. Had dialysis today. Decreased urine for 4 days. AOx4. Cast in place from fracture. 160/70 95% on RA.

## 2021-04-02 NOTE — ED Notes (Signed)
Pt given turkey sandwich tray 

## 2021-04-03 ENCOUNTER — Inpatient Hospital Stay: Payer: Medicare Other | Admitting: Physical Therapy

## 2021-04-03 DIAGNOSIS — N186 End stage renal disease: Secondary | ICD-10-CM | POA: Diagnosis not present

## 2021-04-03 DIAGNOSIS — L03119 Cellulitis of unspecified part of limb: Secondary | ICD-10-CM | POA: Diagnosis not present

## 2021-04-03 DIAGNOSIS — E11628 Type 2 diabetes mellitus with other skin complications: Secondary | ICD-10-CM

## 2021-04-03 DIAGNOSIS — K219 Gastro-esophageal reflux disease without esophagitis: Secondary | ICD-10-CM

## 2021-04-03 LAB — BASIC METABOLIC PANEL
Anion gap: 12 (ref 5–15)
BUN: 49 mg/dL — ABNORMAL HIGH (ref 6–20)
CO2: 25 mmol/L (ref 22–32)
Calcium: 8.4 mg/dL — ABNORMAL LOW (ref 8.9–10.3)
Chloride: 101 mmol/L (ref 98–111)
Creatinine, Ser: 5.3 mg/dL — ABNORMAL HIGH (ref 0.61–1.24)
GFR, Estimated: 12 mL/min — ABNORMAL LOW (ref >60)
Glucose, Bld: 55 mg/dL — ABNORMAL LOW (ref 70–99)
Potassium: 4.2 mmol/L (ref 3.5–5.1)
Sodium: 138 mmol/L (ref 135–145)

## 2021-04-03 LAB — PROTIME-INR
INR: 1.4 — ABNORMAL HIGH (ref 0.8–1.2)
Prothrombin Time: 17 seconds — ABNORMAL HIGH (ref 11.4–15.2)

## 2021-04-03 LAB — CBC
HCT: 29.6 % — ABNORMAL LOW (ref 39.0–52.0)
Hemoglobin: 9.4 g/dL — ABNORMAL LOW (ref 13.0–17.0)
MCH: 31.3 pg (ref 26.0–34.0)
MCHC: 31.8 g/dL (ref 30.0–36.0)
MCV: 98.7 fL (ref 80.0–100.0)
Platelets: 211 10*3/uL (ref 150–400)
RBC: 3 MIL/uL — ABNORMAL LOW (ref 4.22–5.81)
RDW: 17.6 % — ABNORMAL HIGH (ref 11.5–15.5)
WBC: 5.9 10*3/uL (ref 4.0–10.5)
nRBC: 0 % (ref 0.0–0.2)

## 2021-04-03 LAB — GLUCOSE, CAPILLARY
Glucose-Capillary: 75 mg/dL (ref 70–99)
Glucose-Capillary: 186 mg/dL — ABNORMAL HIGH (ref 70–99)
Glucose-Capillary: 214 mg/dL — ABNORMAL HIGH (ref 70–99)
Glucose-Capillary: 57 mg/dL — ABNORMAL LOW (ref 70–99)
Glucose-Capillary: 84 mg/dL (ref 70–99)

## 2021-04-03 LAB — SARS CORONAVIRUS 2 (TAT 6-24 HRS): SARS Coronavirus 2: NEGATIVE

## 2021-04-03 LAB — APTT: aPTT: 40 seconds — ABNORMAL HIGH (ref 24–36)

## 2021-04-03 MED ORDER — SODIUM CHLORIDE 0.45 % IV BOLUS: 250.0000 mL | Freq: Once | INTRAVENOUS | Status: DC

## 2021-04-03 MED ORDER — MIDODRINE HCL 5 MG PO TABS: 5.0000 mg | ORAL_TABLET | Freq: Three times a day (TID) | ORAL | Status: DC

## 2021-04-03 MED ORDER — VANCOMYCIN HCL 750 MG/150ML IV SOLN
750.0000 mg | INTRAVENOUS | Status: DC
  Filled 2021-04-03: qty 150

## 2021-04-03 MED ORDER — VANCOMYCIN HCL 750 MG IV SOLR
750.0000 mg | INTRAVENOUS | Status: DC
  Administered 2021-04-04: 750 mg via INTRAVENOUS
  Filled 2021-04-03: qty 750

## 2021-04-03 MED ORDER — MORPHINE SULFATE (PF) 2 MG/ML IV SOLN: 1.0000 mg | INTRAVENOUS | Status: DC | PRN

## 2021-04-03 NOTE — Progress Notes (Signed)
Inpatient Diabetes Program Recommendations  AACE/ADA: New Consensus Statement on Inpatient Glycemic Control (2015)  Target Ranges:  Prepandial:   less than 140 mg/dL      Peak postprandial:   less than 180 mg/dL (1-2 hours)      Critically ill patients:  140 - 180 mg/dL   Lab Results  Component Value Date   GLUCAP 186 (H) 04/03/2021   HGBA1C 7.8 (H) 03/21/2021    Review of Glycemic Control Results for Richard Zavala, Richard Zavala" (MRN [id number]) as of 04/03/2021 14:34  Ref. Range 03/25/2021 15:04 03/25/2021 16:45 04/02/2021 23:03 04/03/2021 07:58 04/03/2021 12:32  Glucose-Capillary Latest Ref Range: 70 - 99 mg/dL 137 (H) 127 (H) 186 (H) 75 186 (H)   Diabetes history: DM 2 Outpatient Diabetes medications:  Lantus 9-10 units daily Current orders for Inpatient glycemic control:  Novolog moderate tid with meals and HS Lantus 10 units daily Inpatient Diabetes Program Recommendations:   Note that patient received Novolog 3 units last PM at HS per moderate Novolog correction.  Please consider d/c of Novolog coverage at bedtime?   Thanks,  Adah Perl, RN, BC-ADM Inpatient Diabetes Coordinator Pager [phone] (8a-5p)

## 2021-04-03 NOTE — Progress Notes (Signed)
PROGRESS NOTE    Richard Zavala  CHY:[id number] DOB: 10-02-1961 DOA: 04/02/2021 PCP: Midge Minium, PA    Brief Narrative:  Richard Zavala is a 60 y.o. Caucasian male with medical history significant for multiple medical problems that are mentioned below including CHF, coronary artery disease, end-stage renal disease on hemodialysis, hypertension, dyslipidemia, peripheral neuropathy, CVA and BPH, who presented to the ER with acute onset of worsening right foot swelling with erythema and mild extension to the right leg for last 4 days.  The patient had ORIF of the left leg during recent admission here and was discharged on 03/25/2021.  He has a right foot ulcer and scattered healed ulcers over his foot and leg.  He denied any fever or chills.  He has no problems currently from his left leg.  Earlier today he finished his session of hemodialysis that he gets on Monday, Wednesday and Fridays.  No chest pain or palpitations.  No reported cough or wheezing or dyspnea.  ED Course: Upon presentation to the ER, temperature was 100 with a blood pressure 137/99 with otherwise normal vital signs.  Labs revealed blood glucose of 216 and a BUN of 39 with creatinine 4.82 with anion gap of 13.  Albumin was 2.6.  Lactic acid was 1.1.  CBC showed anemia close to his baseline.  Blood culture was drawn.  4/14- no overnight issues. Saw pt this am. In the after noon nsg stated pts bp 81/68, repeat with manual 153/71.  Was planning to give bolus and midodrine, but d/c'd since repeat with manual was stable.  Consultants:   ID, vascular surgery                                    Procedures:  Antimicrobials:      Subjective: Mild pain of foot. No other issues  Objective: Vitals:   04/03/21 0058 04/03/21 0130 04/03/21 0500 04/03/21 0823  BP: 133/67  140/74 121/69  Pulse: 68  66 64  Resp: 18   18  Temp: 98.4 F (36.9 C)  99.1 F (37.3 C) 98.3 F (36.8 C)  TempSrc: Oral  Oral Oral  SpO2: 96%    93%  Weight:  73.9 kg    Height:  5\' 8"  (1.727 m)      Intake/Output Summary (Last 24 hours) at 04/03/2021 0830 Last data filed at 04/03/2021 0614 Gross per 24 hour  Intake 50 ml  Output --  Net 50 ml   Filed Weights   04/02/21 1755 04/03/21 0130  Weight: 71 kg 73.9 kg    Examination:  General exam: Appears calm and comfortable  Respiratory system: Clear to auscultation. Respiratory effort normal. Cardiovascular system: S1 & S2 heard, RRR. No JVD, murmurs, rubs, gallops or clicks.  Gastrointestinal system: Abdomen is nondistended, soft and nontend. Normal bowel sounds heard. Central nervous system: Alert and oriented. Grossly intact. Extremities: LE edema , Lt LE wrapped , did not open. RLE cool to touch , decrease pedal pulses. Multiple ulcers, one larger in mid feet. Mild erythema  Of anterior lateral aspect of rt foot Psychiatry:  Mood & affect appropriate.     Data Reviewed: I have personally reviewed following labs and imaging studies  CBC: Recent Labs  Lab 04/02/21 1801 04/03/21 0543  WBC 6.6 5.9  NEUTROABS 4.9  --   HGB 9.4* 9.4*  HCT 29.5* 29.6*  MCV 98.0 98.7  PLT 234  347   Basic Metabolic Panel: Recent Labs  Lab 04/02/21 1801 04/03/21 0543  NA 135 138  K 4.4 4.2  CL 99 101  CO2 23 25  GLUCOSE 216* 55*  BUN 39* 49*  CREATININE 4.82* 5.30*  CALCIUM 8.3* 8.4*   GFR: Estimated Creatinine Clearance: 14.7 mL/min (A) (by C-G formula based on SCr of 5.3 mg/dL (H)). Liver Function Tests: Recent Labs  Lab 04/02/21 1801  AST 19  ALT 16  ALKPHOS 119  BILITOT 0.8  PROT 6.3*  ALBUMIN 2.6*   No results for input(s): LIPASE, AMYLASE in the last 168 hours. No results for input(s): AMMONIA in the last 168 hours. Coagulation Profile: Recent Labs  Lab 04/03/21 0543  INR 1.4*   Cardiac Enzymes: No results for input(s): CKTOTAL, CKMB, CKMBINDEX, TROPONINI in the last 168 hours. BNP (last 3 results) No results for input(s): PROBNP in the last 8760  hours. HbA1C: No results for input(s): HGBA1C in the last 72 hours. CBG: Recent Labs  Lab 04/02/21 2303 04/03/21 0758  GLUCAP 186* 75   Lipid Profile: No results for input(s): CHOL, HDL, LDLCALC, TRIG, CHOLHDL, LDLDIRECT in the last 72 hours. Thyroid Function Tests: No results for input(s): TSH, T4TOTAL, FREET4, T3FREE, THYROIDAB in the last 72 hours. Anemia Panel: No results for input(s): VITAMINB12, FOLATE, FERRITIN, TIBC, IRON, RETICCTPCT in the last 72 hours. Sepsis Labs: Recent Labs  Lab 04/02/21 1801  LATICACIDVEN 1.1    Recent Results (from the past 240 hour(s))  Resp Panel by RT-PCR (Flu A&B, Covid) Nasopharyngeal Swab     Status: None   Collection Time: 03/25/21 12:14 PM   Specimen: Nasopharyngeal Swab; Nasopharyngeal(NP) swabs in vial transport medium  Result Value Ref Range Status   SARS Coronavirus 2 by RT PCR NEGATIVE NEGATIVE Final    Comment: (NOTE) SARS-CoV-2 target nucleic acids are NOT DETECTED.  The SARS-CoV-2 RNA is generally detectable in upper respiratory specimens during the acute phase of infection. The lowest concentration of SARS-CoV-2 viral copies this assay can detect is 138 copies/mL. A negative result does not preclude SARS-Cov-2 infection and should not be used as the sole basis for treatment or other patient management decisions. A negative result may occur with  improper specimen collection/handling, submission of specimen other than nasopharyngeal swab, presence of viral mutation(s) within the areas targeted by this assay, and inadequate number of viral copies(<138 copies/mL). A negative result must be combined with clinical observations, patient history, and epidemiological information. The expected result is Negative.  Fact Sheet for Patients:  EntrepreneurPulse.com.au  Fact Sheet for Healthcare Providers:  IncredibleEmployment.be  This test is no t yet approved or cleared by the [place]  FDA and  has been authorized for detection and/or diagnosis of SARS-CoV-2 by FDA under an Emergency Use Authorization (EUA). This EUA will remain  in effect (meaning this test can be used) for the duration of the COVID-19 declaration under Section 564(b)(1) of the Act, 21 U.S.C.section 360bbb-3(b)(1), unless the authorization is terminated  or revoked sooner.       Influenza A by PCR NEGATIVE NEGATIVE Final   Influenza B by PCR NEGATIVE NEGATIVE Final    Comment: (NOTE) The Xpert Xpress SARS-CoV-2/FLU/RSV plus assay is intended as an aid in the diagnosis of influenza from Nasopharyngeal swab specimens and should not be used as a sole basis for treatment. Nasal washings and aspirates are unacceptable for Xpert Xpress SARS-CoV-2/FLU/RSV testing.  Fact Sheet for Patients: EntrepreneurPulse.com.au  Fact Sheet for Healthcare Providers: IncredibleEmployment.be  This  test is not yet approved or cleared by the [place] and has been authorized for detection and/or diagnosis of SARS-CoV-2 by FDA under an Emergency Use Authorization (EUA). This EUA will remain in effect (meaning this test can be used) for the duration of the COVID-19 declaration under Section 564(b)(1) of the Act, 21 U.S.C. section 360bbb-3(b)(1), unless the authorization is terminated or revoked.  Performed at [facility], [address] [place], [place] [address]   Culture, blood (routine x 2)     Status: None (Preliminary result)   Collection Time: 04/02/21  6:01 PM   Specimen: Left Antecubital; Blood  Result Value Ref Range Status   Specimen Description LEFT ANTECUBITAL  Final   Special Requests   Final    BOTTLES DRAWN AEROBIC AND ANAEROBIC Blood Culture results may not be optimal due to an excessive volume of blood received in culture bottles   Culture   Final    NO GROWTH < 12 HOURS Performed at [facility], [address] [place],  [place] [address]    Report Status PENDING  Incomplete  Culture, blood (routine x 2)     Status: None (Preliminary result)   Collection Time: 04/02/21  8:15 PM   Specimen: BLOOD  Result Value Ref Range Status   Specimen Description BLOOD BLOOD LEFT HAND  Final   Special Requests   Final    BOTTLES DRAWN AEROBIC AND ANAEROBIC Blood Culture adequate volume   Culture   Final    NO GROWTH < 12 HOURS Performed at [facility], [address] [place], [place] [address]    Report Status PENDING  Incomplete  SARS CORONAVIRUS 2 (TAT 6-24 HRS) Nasopharyngeal Nasopharyngeal Swab     Status: None   Collection Time: 04/02/21  8:20 PM   Specimen: Nasopharyngeal Swab  Result Value Ref Range Status   SARS Coronavirus 2 NEGATIVE NEGATIVE Final    Comment: (NOTE) SARS-CoV-2 target nucleic acids are NOT DETECTED.  The SARS-CoV-2 RNA is generally detectable in upper and lower respiratory specimens during the acute phase of infection. Negative results do not preclude SARS-CoV-2 infection, do not rule out co-infections with other pathogens, and should not be used as the sole basis for treatment or other patient management decisions. Negative results must be combined with clinical observations, patient history, and epidemiological information. The expected result is Negative.  Fact Sheet for Patients: SugarRoll.be  Fact Sheet for Healthcare Providers: https://www.woods-mathews.com/  This test is not yet approved or cleared by the [place] FDA and  has been authorized for detection and/or diagnosis of SARS-CoV-2 by FDA under an Emergency Use Authorization (EUA). This EUA will remain  in effect (meaning this test can be used) for the duration of the COVID-19 declaration under Se ction 564(b)(1) of the Act, 21 U.S.C. section 360bbb-3(b)(1), unless the authorization is terminated or revoked sooner.  Performed at [facility], [place] [address] [place], [place] [address]          Radiology Studies: US Venous Img Lower Unilateral Right  Result Date: 04/02/2021 CLINICAL DATA:  Right lower extremity edema for 1 week EXAM: RIGHT LOWER EXTREMITY VENOUS DOPPLER ULTRASOUND TECHNIQUE: Gray-scale sonography with compression, as well as color and duplex ultrasound, were performed to evaluate the deep venous system(s) from the level of the common femoral vein through the popliteal and proximal calf veins. COMPARISON:  None. FINDINGS: VENOUS Normal compressibility of the common femoral, superficial femoral, and popliteal veins, as well as  the visualized calf veins. Visualized portions of profunda femoral vein and great saphenous vein unremarkable. No filling defects to suggest DVT on grayscale or color Doppler imaging. Doppler waveforms show normal direction of venous flow, normal respiratory plasticity and response to augmentation. Limited views of the contralateral common femoral vein are unremarkable. OTHER None. Limitations: none IMPRESSION: 1. No evidence of deep venous thrombosis within the right lower extremity. Electronically Signed   By: Randa Ngo M.D.   On: 04/02/2021 21:05   DG Foot 2 Views Right  Result Date: 04/02/2021 CLINICAL DATA:  Right foot swelling greatest at first digit EXAM: RIGHT FOOT - 2 VIEW COMPARISON:  None. FINDINGS: Frontal and lateral views of the right foot are obtained. The bones are diffusely osteopenic. Prior healed second through fifth metatarsal fractures. No acute bony abnormalities. There is diffuse interphalangeal joint space narrowing, as well as prominent joint space narrowing and osteophyte formation of the first metatarsophalangeal joint, most consistent with osteoarthritis. Large inferior calcaneal spur. Ulceration dorsal aspect of the forefoot. Diffuse soft tissue swelling of the forefoot greatest at the first digit. IMPRESSION: 1. Forefoot soft tissue swelling and dorsal ulceration. 2. No acute or  destructive bony lesions. 3. Multifocal osteoarthritis. 4. Osteopenia. Electronically Signed   By: Randa Ngo M.D.   On: 04/02/2021 19:04        Scheduled Meds: . atorvastatin  80 mg Oral QHS  . carvedilol  12.5 mg Oral BID  . heparin injection (subcutaneous)  5,000 Units Subcutaneous Q12H  . insulin aspart  0-15 Units Subcutaneous TID PC & HS  . insulin glargine  10 Units Subcutaneous Daily  . lidocaine  1 patch Transdermal Daily  . pantoprazole  40 mg Oral BID AC  . sodium chloride flush  3 mL Intravenous Q12H   Continuous Infusions: . sodium chloride    . ceFEPime (MAXIPIME) IV 1 g (04/03/21 8101)  . vancomycin      Assessment & Plan:   Active Problems:   Cellulitis in diabetic foot ([place])   1.  Diabetic right foot moderate nonpurulent cellulitis secondary to infected ulcer in the setting of immunosuppression with diabetes mellitus. ID consulted Continue on ivabx with cefepime and vancomycin Since difficult to assess pt's peripheral pulses /pedal , foot cooler to touch, will consult vascular surgery May consider podiatry consult if warranted, for now will hold until f/u with vasculars input   2.  Type II diabetes mellitus with mild hyperglycemia. BG level stable riss Hypoglycemic protocal  3.  End-stage renal disease on hemodialysis. On HD, nephrology consulted  4.  Dyslipidemia. Continue statin  5.  Essential hypertension. Hold coreg for now, continue norvasc  6.  GERD. -We will continue PPI therapy.  7.  Coronary artery disease. -We will continue beta-blocker therapy, Imdur and aspirin as well as statin therapy.    DVT prophylaxis: heparin Code Status:full Family Communication: none at bedside  Status is: Inpatient  The patient remains inpatient due to severity of illness and requiring IV treatment.  Dispo: The patient is from:home              Anticipated d/c is to: TBD/home              Patient currently not medically stable    Difficult to place patient:no            LOS: 1 day   Time spent: 35 min with >50% on coc    Nolberto Hanlon, MD [facility] Pager [phone]  If  7PM-7AM, please contact night-coverage 04/03/2021, 8:30 AM

## 2021-04-03 NOTE — Progress Notes (Addendum)
Secure chat to Dr. Kurtis Bushman of BP 81/68  See repeat vitals on vital sign flow sheet.

## 2021-04-03 NOTE — Plan of Care (Signed)

## 2021-04-03 NOTE — Progress Notes (Signed)
   04/03/21 1545  Assess: MEWS Score  Temp 98.2 F (36.8 C)  BP (!) 81/68  Pulse Rate (!) 108  Resp 16  SpO2 98 %  O2 Device Room Air  Assess: MEWS Score  MEWS Temp 0  MEWS Systolic 1  MEWS Pulse 1  MEWS RR 0  MEWS LOC 0  MEWS Score 2  MEWS Score Color Yellow  Assess: if the MEWS score is Yellow or Red  Were vital signs taken at a resting state? Yes  Focused Assessment No change from prior assessment  Early Detection of Sepsis Score *See Row Information* Low  MEWS guidelines implemented *See Row Information* Yes  Treat  Pain Scale 0-10  Pain Score 0  Take Vital Signs  Increase Vital Sign Frequency  Yellow: Q 2hr X 2 then Q 4hr X 2, if remains yellow, continue Q 4hrs  Escalate  MEWS: Escalate Yellow: discuss with charge nurse/RN and consider discussing with provider and RRT  Notify: Charge Nurse/RN  Name of Charge Nurse/RN Notified Anabella,RN  Date Charge Nurse/RN Notified 04/03/21  Time Charge Nurse/RN Notified 1545  Notify: Provider  Provider Name/Title Dr. Kurtis Bushman  Date Provider Notified 04/03/21  Time Provider Notified 4353  Notification Type Page  Notification Reason Other (Comment)  Provider response See new orders  Date of Provider Response 04/03/21  Document  Patient Outcome Stabilized after interventions

## 2021-04-03 NOTE — Consult Note (Signed)
[facility] Consult Note  MRN : [id number]  Richard Zavala is a 60 y.o. ([birth year]) male who presents with chief complaint of  Chief Complaint  Patient presents with  . Leg Swelling  .  History of Present Illness:   I am asked to evaluate the patient by Dr. Kurtis Bushman.  Patient is a 60 year old gentleman with end-stage renal disease on hemodialysis who presented to [facility] with multiple ulcers and signs and symptoms consistent with infection of the right foot.  He has been evaluated by both the hospitalist service as well as Dr. Ola Spurr for infectious disease and both have raised concern regarding atherosclerotic occlusive disease.  I am asked to evaluate the patient for painful lower extremities and diminished pulses associated with ulceration of the foot.  The patient notes the ulcer has been present for multiple weeks and has not been improving.  It is very painful and has had some drainage.  No specific history of trauma noted by the patient.  The patient denies fever or chills.  the patient does have diabetes which has been difficult to control.  The patient denies rest pain or dangling of an extremity off the side of the bed during the night for relief. No prior interventions or surgeries.  No history of back problems or DJD of the lumbar sacral spine.   The patient denies amaurosis fugax or recent TIA symptoms. There are no recent neurological changes noted. The patient denies history of DVT, PE or superficial thrombophlebitis. The patient denies recent episodes of angina or shortness of breath.   Current Facility-Administered Medications  Medication Dose Route Frequency Provider Last Rate Last Admin  . 0.9 %  sodium chloride infusion  250 mL Intravenous PRN Mansy, Jan A, MD      . acetaminophen (TYLENOL) tablet 650 mg  650 mg Oral Q6H PRN Mansy, Jan A, MD   650 mg at 04/03/21 1059   Or  . acetaminophen (TYLENOL)  suppository 650 mg  650 mg Rectal Q6H PRN Mansy, Jan A, MD      . albuterol (VENTOLIN HFA) 108 (90 Base) MCG/ACT inhaler 2 puff  2 puff Inhalation Q6H PRN Mansy, Jan A, MD      . atorvastatin (LIPITOR) tablet 80 mg  80 mg Oral QHS Mansy, Jan A, MD   80 mg at 04/03/21 2026  . ceFEPIme (MAXIPIME) 1 g in sodium chloride 0.9 % 100 mL IVPB  1 g Intravenous Q24H Darnelle Bos, RPH   Stopped at 04/03/21 1434  . heparin injection 5,000 Units  5,000 Units Subcutaneous Q12H Mansy, Arvella Merles, MD   5,000 Units at 04/03/21 2026  . insulin aspart (novoLOG) injection 0-15 Units  0-15 Units Subcutaneous TID PC & HS Mansy, Arvella Merles, MD   5 Units at 04/03/21 1743  . insulin glargine (LANTUS) injection 10 Units  10 Units Subcutaneous Daily Mansy, Jan A, MD      . lidocaine (LIDODERM) 5 % 1 patch  1 patch Transdermal Daily Mansy, Jan A, MD   1 patch at 04/03/21 1059  . magnesium hydroxide (MILK OF MAGNESIA) suspension 30 mL  30 mL Oral Daily PRN Mansy, Jan A, MD      . morphine 2 MG/ML injection 1 mg  1 mg Intravenous Q4H PRN Nolberto Hanlon, MD      . ondansetron (ZOFRAN) tablet 4 mg  4 mg Oral Q6H PRN Mansy, Arvella Merles, MD       Or  .  ondansetron (ZOFRAN) injection 4 mg  4 mg Intravenous Q6H PRN Mansy, Jan A, MD      . oxyCODONE (Oxy IR/ROXICODONE) immediate release tablet 5 mg  5 mg Oral Q4H PRN Mansy, Jan A, MD      . pantoprazole (PROTONIX) EC tablet 40 mg  40 mg Oral BID AC Mansy, Jan A, MD   40 mg at 04/03/21 1743  . sodium chloride flush (NS) 0.9 % injection 3 mL  3 mL Intravenous Q12H Mansy, Jan A, MD   3 mL at 04/03/21 2026  . sodium chloride flush (NS) 0.9 % injection 3 mL  3 mL Intravenous PRN Mansy, Jan A, MD      . traZODone (DESYREL) tablet 25 mg  25 mg Oral QHS PRN Mansy, Jan A, MD      . Derrill Memo ON 04/04/2021] vancomycin (VANCOCIN) 750 mg in sodium chloride 0.9 % 250 mL IVPB  750 mg Intravenous Q M,W,F-HD Dorothe Pea, [facility]        Past Medical History:  Diagnosis Date  . Anemia   . Blind   . BPH (benign  prostatic hyperplasia)   . CAD (coronary artery disease)   . CHF (congestive heart failure) ([place])   . Chronic kidney disease (CKD), stage IV (severe) ([facility])    DIALYSIS  . Diabetes mellitus without complication ([place])   . ESRD (end stage renal disease) ([place])    Monday-Wednesday-Friday dialysis  . Hyperlipemia   . Hypertension   . Neuropathy   . Osteoporosis   . Pulmonary HTN ([place])   . Stroke [facility])    2013  . TIA (transient ischemic attack)   . TRD (traction retinal detachment)   . Wilms' tumor [facility])     Past Surgical History:  Procedure Laterality Date  . A/V FISTULAGRAM Left 01/28/2017   Procedure: A/V Fistulagram;  Surgeon: Algernon Huxley, MD;  Location: [place] [facility];  Service: Cardiovascular;  Laterality: Left;  . A/V FISTULAGRAM Left 04/28/2018   Procedure: A/V FISTULAGRAM;  Surgeon: Algernon Huxley, MD;  Location: [place] [facility];  Service: Cardiovascular;  Laterality: Left;  . A/V FISTULAGRAM N/A 03/21/2020   Procedure: A/V FISTULAGRAM;  Surgeon: Algernon Huxley, MD;  Location: [place] [facility];  Service: Cardiovascular;  Laterality: N/A;  . AV FISTULA PLACEMENT    . AV FISTULA PLACEMENT Right 11/23/2019   Procedure: ARTERIOVENOUS (AV) FISTULA CREATION (RADIOCEPHALIC );  Surgeon: Algernon Huxley, MD;  Location: [facility];  Service: Vascular;  Laterality: Right;  . CARDIAC CATHETERIZATION    . CATARACT EXTRACTION W/PHACO Right 05/14/2016   Procedure: CATARACT EXTRACTION PHACO AND INTRAOCULAR LENS PLACEMENT (IOC);  Surgeon: Eulogio Bear, MD;  Location: [facility];  Service: Ophthalmology;  Laterality: Right;  [place] 1.46 AP% 11.5 CDE 12.33 FLUID PACK LOT # [id number]  . EYE SURGERY    . FRACTURE SURGERY     RIGHT LEG WITH ROD  . HIP SURGERY    . NEPHRECTOMY RADICAL    . PERIPHERAL VASCULAR CATHETERIZATION N/A 08/28/2015   Procedure: A/V Shuntogram/Fistulagram;  Surgeon: Algernon Huxley, MD;  Location: [place] [facility];  Service: Cardiovascular;  Laterality: N/A;  . PERIPHERAL  VASCULAR CATHETERIZATION Left 08/28/2015   Procedure: A/V Shunt Intervention;  Surgeon: Algernon Huxley, MD;  Location: [place] [facility];  Service: Cardiovascular;  Laterality: Left;  . TIBIA IM NAIL INSERTION Left 03/21/2021   Procedure: INTRAMEDULLARY (IM) NAIL TIBIAL;  Surgeon: Thornton Park, MD;  Location: [facility];  Service: Orthopedics;  Laterality: Left;  . UPPER EXTREMITY ANGIOGRAPHY Right 08/01/2020   Procedure: UPPER EXTREMITY ANGIOGRAPHY;  Surgeon: Algernon Huxley, MD;  Location: [place] [facility];  Service: Cardiovascular;  Laterality: Right;    Social History Social History   Tobacco Use  . Smoking status: Former Smoker    Packs/day: 1.00    Types: Cigarettes    Quit date: 06/26/2012    Years since quitting: 8.7  . Smokeless tobacco: Never Used  Vaping Use  . Vaping Use: Never used  Substance Use Topics  . Alcohol use: No  . Drug use: No    Family History Family History  Problem Relation Age of Onset  . CAD Father   . COPD Father   . CAD Paternal Grandfather   . Diabetes Maternal Aunt   . Diabetes Maternal Uncle   No family history of bleeding/clotting disorders, porphyria or autoimmune disease   Allergies  Allergen Reactions  . Metformin Other (See Comments)    Severe diarrhea   . Penicillins Hives and Other (See Comments)    Has patient had a PCN reaction causing immediate rash, facial/tongue/throat swelling, SOB or lightheadedness with hypotension:Yes Has patient had a PCN reaction causing severe rash involving mucus membranes or skin necrosis:Yes Has patient had a PCN reaction that required hospitalization:inpatient at the time of reaction. Has patient had a PCN reaction occurring within the last 10 years:No If all of the above answers are "NO", then may proceed with Cephalosporin use.      REVIEW OF SYSTEMS (Negative unless checked)  Constitutional: [] Weight loss  [] Fever  [] Chills Cardiac: [] Chest pain   [] Chest pressure   [] Palpitations    [] Shortness of breath when laying flat   [] Shortness of breath at rest   [] Shortness of breath with exertion. Vascular:  [x] Pain in legs with walking   [] Pain in legs at rest   [] Pain in legs when laying flat   [] Claudication   [] Pain in feet when walking  [] Pain in feet at rest  [] Pain in feet when laying flat   [] History of DVT   [] Phlebitis   [] Swelling in legs   [] Varicose veins   [x] Non-healing ulcers Pulmonary:   [] Uses home oxygen   [] Productive cough   [] Hemoptysis   [] Wheeze  [] COPD   [] Asthma Neurologic:  [] Dizziness  [] Blackouts   [] Seizures   [] History of stroke   [] History of TIA  [] Aphasia   [] Temporary blindness   [] Dysphagia   [] Weakness or numbness in arms   [x] Weakness or numbness in legs Musculoskeletal:  [] Arthritis   [] Joint swelling   [x] Joint pain   [] Low back pain Hematologic:  [] Easy bruising  [] Easy bleeding   [] Hypercoagulable state   [] Anemic  [] Hepatitis Gastrointestinal:  [] Blood in stool   [] Vomiting blood  [] Gastroesophageal reflux/heartburn   [] Difficulty swallowing. Genitourinary:  [x] Chronic kidney disease   [] Difficult urination  [] Frequent urination  [] Burning with urination   [] Blood in urine Skin:  [] Rashes   [] Ulcers   [] Wounds Psychological:  [] History of anxiety   []  History of major depression.    Physical Examination  Vitals:   04/03/21 1706 04/03/21 1855 04/03/21 1934 04/03/21 2036  BP: (!) 153/71 (!) 145/100 (!) 152/97 124/73  Pulse: 61 62 64 62  Resp: 18 18 18 16   Temp: 98.3 F (36.8 C)  97.8 F (36.6 C) 97.8 F (36.6 C)  TempSrc:   Oral Oral  SpO2:  99% 98% 100%  Weight:      Height:  Body mass index is 24.77 kg/m.  Head: [place]/AT, No temporalis wasting. Prominent temp pulse not noted. Ear/Nose/Throat: Nares w/o erythema or drainage, oropharynx w/o obsrtuction, Mallampati score: 3.  Dentition poor.  Eyes: PERRLA, Sclera nonicteric.  Neck: Supple, no nuchal rigidity.  No bruit or JVD.  Pulmonary:  Breath sounds equal bilaterally, no  use of accessory muscles.  Cardiac: RRR, normal S1, S2, no Murmurs, rubs or gallops. Vascular: Multiple ulcers right foot dorsal ulcer appears infected.  Pedal pulses on the right are nonpalpable.  Popliteal pulses nonpalpable. Gastrointestinal: soft, non-tender, non-distended.  Musculoskeletal: Moves all extremities.  No deformity or atrophy. No edema. Neurologic: CN 2-12 intact. Symmetrical.  Speech is fluent.  Psychiatric: Judgment intact, Mood & affect appropriate for pt's clinical situation. Dermatologic: No rashes or ulcers noted.  No cellulitis or open wounds.      CBC Lab Results  Component Value Date   WBC 5.9 04/03/2021   HGB 9.4 (L) 04/03/2021   HCT 29.6 (L) 04/03/2021   MCV 98.7 04/03/2021   PLT 211 04/03/2021    BMET    Component Value Date/Time   NA 138 04/03/2021 0543   NA 141 10/28/2012 0543   K 4.2 04/03/2021 0543   K 5.2 (H) 10/28/2012 0543   CL 101 04/03/2021 0543   CL 111 (H) 10/28/2012 0543   CO2 25 04/03/2021 0543   CO2 22 10/28/2012 0543   GLUCOSE 55 (L) 04/03/2021 0543   GLUCOSE 151 (H) 10/28/2012 0543   BUN 49 (H) 04/03/2021 0543   BUN 27 (H) 10/28/2012 0543   CREATININE 5.30 (H) 04/03/2021 0543   CREATININE 1.52 (H) 10/28/2012 0543   CALCIUM 8.4 (L) 04/03/2021 0543   CALCIUM 8.0 (L) 10/28/2012 0543   GFRNONAA 12 (L) 04/03/2021 0543   GFRNONAA 53 (L) 10/28/2012 0543   GFRAA 10 (L) 03/07/2020 1931   GFRAA >60 10/28/2012 0543   Estimated Creatinine Clearance: 14.7 mL/min (A) (by C-G formula based on SCr of 5.3 mg/dL (H)).  COAG Lab Results  Component Value Date   INR 1.4 (H) 04/03/2021   INR 1.4 (H) 03/20/2021   INR 1.3 (H) 02/07/2021    Assessment/Plan 1.  Atherosclerotic occlusive disease bilateral lower extremities with multiple ulcerations of the right foot: I will obtain noninvasive studies to assess his circulation.  However, given the appearance of his foot and the physical exam I believe it is highly likely he will require  angiography with intervention for improvement in his circulation to allow for wound healing.  I did discuss this with the patient.  ABIs will be evaluated initially and we will base further decisions on the results.  2.  Multiple right foot ulcers: Plan per wound care and infectious disease.  3.  End-stage renal disease: Patient's fistula is working well we will continue hemodialysis as arranged.  4.  Coronary artery disease: Continue cardiac and antihypertensive medications as already ordered and reviewed, no changes at this time.  Continue statin as ordered and reviewed, no changes at this time  Nitrates PRN for chest pain    Hortencia Pilar, MD  04/03/2021 9:48 PM

## 2021-04-03 NOTE — Consult Note (Signed)
Infectious Disease     Reason for Consult:Leg wounds and cellulitis    Referring Physician: Dr Kurtis Bushman Date of Admission:  04/02/2021   Active Problems:   Cellulitis in diabetic foot ([place])   HPI: Richard Zavala is a 60 y.o. male with medical history significant for multiple medical problems that are mentioned below including CHF, coronary artery disease, end-stage renal disease on hemodialysis, hypertension, dyslipidemia, peripheral neuropathy, CVA and BPH, who presented to the ER with acute onset of worsening right foot swelling with erythema and mild extension to the right leg for last 4 days.  The patient had ORIF of the left leg during recent admission here and was discharged on 03/25/2021. On admit temp 100.0, wbc 6.6. [place] done. Xray shows soft tissue swelling but no osteomyelitis. Started cefepime and vanco.  He tells me he has very poor vision and PN so not sure when the wounds developed or how long the redness have been there.  Had been referred per his report to Dr Lucky Cowboy but then he broke his R leg.    Past Medical History:  Diagnosis Date  . Anemia   . Blind   . BPH (benign prostatic hyperplasia)   . CAD (coronary artery disease)   . CHF (congestive heart failure) ([place])   . Chronic kidney disease (CKD), stage IV (severe) (H[facility])    DIALYSIS  . Diabetes mellitus without complication ([place])   . ESRD (end stage renal disease) ([place])    Monday-Wednesday-Friday dialysis  . Hyperlipemia   . Hypertension   . Neuropathy   . Osteoporosis   . Pulmonary HTN ([place])   . Stroke [facility])    2013  . TIA (transient ischemic attack)   . TRD (traction retinal detachment)   . Wilms' tumor [facility])    Past Surgical History:  Procedure Laterality Date  . A/V FISTULAGRAM Left 01/28/2017   Procedure: A/V Fistulagram;  Surgeon: Algernon Huxley, MD;  Location: [place] [facility];  Service: Cardiovascular;  Laterality: Left;  . A/V FISTULAGRAM Left 04/28/2018   Procedure: A/V FISTULAGRAM;  Surgeon: Algernon Huxley,  MD;  Location: [place] [facility];  Service: Cardiovascular;  Laterality: Left;  . A/V FISTULAGRAM N/A 03/21/2020   Procedure: A/V FISTULAGRAM;  Surgeon: Algernon Huxley, MD;  Location: [place] [facility];  Service: Cardiovascular;  Laterality: N/A;  . AV FISTULA PLACEMENT    . AV FISTULA PLACEMENT Right 11/23/2019   Procedure: ARTERIOVENOUS (AV) FISTULA CREATION (RADIOCEPHALIC );  Surgeon: Algernon Huxley, MD;  Location: [facility] ORS;  Service: Vascular;  Laterality: Right;  . CARDIAC CATHETERIZATION    . CATARACT EXTRACTION W/PHACO Right 05/14/2016   Procedure: CATARACT EXTRACTION PHACO AND INTRAOCULAR LENS PLACEMENT (IOC);  Surgeon: Eulogio Bear, MD;  Location: [facility] ORS;  Service: Ophthalmology;  Laterality: Right;  [place] 1.46 AP% 11.5 CDE 12.33 FLUID PACK LOT # [id number]  . EYE SURGERY    . FRACTURE SURGERY     RIGHT LEG WITH ROD  . HIP SURGERY    . NEPHRECTOMY RADICAL    . PERIPHERAL VASCULAR CATHETERIZATION N/A 08/28/2015   Procedure: A/V Shuntogram/Fistulagram;  Surgeon: Algernon Huxley, MD;  Location: [place] [facility];  Service: Cardiovascular;  Laterality: N/A;  . PERIPHERAL VASCULAR CATHETERIZATION Left 08/28/2015   Procedure: A/V Shunt Intervention;  Surgeon: Algernon Huxley, MD;  Location: [place] [facility];  Service: Cardiovascular;  Laterality: Left;  . TIBIA IM NAIL INSERTION Left 03/21/2021   Procedure: INTRAMEDULLARY (IM) NAIL TIBIAL;  Surgeon: Thornton Park, MD;  Location: [facility] ORS;  Service: Orthopedics;  Laterality: Left;  . UPPER EXTREMITY ANGIOGRAPHY Right 08/01/2020   Procedure: UPPER EXTREMITY ANGIOGRAPHY;  Surgeon: Algernon Huxley, MD;  Location: [place] [facility];  Service: Cardiovascular;  Laterality: Right;   Social History   Tobacco Use  . Smoking status: Former Smoker    Packs/day: 1.00    Types: Cigarettes    Quit date: 06/26/2012    Years since quitting: 8.7  . Smokeless tobacco: Never Used  Vaping Use  . Vaping Use: Never used  Substance Use Topics  . Alcohol  use: No  . Drug use: No   Family History  Problem Relation Age of Onset  . CAD Father   . COPD Father   . CAD Paternal Grandfather   . Diabetes Maternal Aunt   . Diabetes Maternal Uncle     Allergies:  Allergies  Allergen Reactions  . Metformin Other (See Comments)    Severe diarrhea   . Penicillins Hives and Other (See Comments)    Has patient had a PCN reaction causing immediate rash, facial/tongue/throat swelling, SOB or lightheadedness with hypotension:Yes Has patient had a PCN reaction causing severe rash involving mucus membranes or skin necrosis:Yes Has patient had a PCN reaction that required hospitalization:inpatient at the time of reaction. Has patient had a PCN reaction occurring within the last 10 years:No If all of the above answers are "NO", then may proceed with Cephalosporin use.     Current antibiotics: Antibiotics Given (last 72 hours)    Date/Time Action Medication Dose Rate   04/03/21 0614 New Bag/Given   ceFEPIme (MAXIPIME) 1 g in sodium chloride 0.9 % 100 mL IVPB 1 g 200 mL/hr   04/03/21 0922 New Bag/Given   vancomycin (VANCOREADY) IVPB 1500 mg/300 mL 1,500 mg 150 mL/hr      MEDICATIONS: . atorvastatin  80 mg Oral QHS  . carvedilol  12.5 mg Oral BID  . heparin injection (subcutaneous)  5,000 Units Subcutaneous Q12H  . insulin aspart  0-15 Units Subcutaneous TID PC & HS  . insulin glargine  10 Units Subcutaneous Daily  . lidocaine  1 patch Transdermal Daily  . pantoprazole  40 mg Oral BID AC  . sodium chloride flush  3 mL Intravenous Q12H    Review of Systems - 11 systems reviewed and negative per HPI   OBJECTIVE: Temp:  [97.5 F (36.4 C)-100 F (37.8 C)] 97.5 F (36.4 C) (04/14 1229) Pulse Rate:  [64-75] 65 (04/14 1229) Resp:  [16-18] 16 (04/14 1229) BP: (116-140)/(49-99) 121/69 (04/14 0823) SpO2:  [93 %-100 %] 100 % (04/14 1229) Weight:  [71 kg-73.9 kg] 73.9 kg (04/14 0130) Physical Exam  Constitutional: He is oriented to person,  place, and time. He appears well-developed and well-nourished. No distress.  HENT: anicteric, L eye is cloudy Mouth/Throat: Oropharynx is clear and moist. No oropharyngeal exudate.  Cardiovascular: Normal rate, regular rhythm and normal heart sounds. Pulmonary/Chest: Effort normal and breath sounds normal. No respiratory distress. He has no wheezes.  Abdominal: Soft. Bowel sounds are normal. He exhibits no distension. There is no tenderness.  Lymphadenopathy: He has no cervical adenopathy.  Neurological: He is alert and oriented to person, place, and time.  Diminished sensation RLE Skin: see photo. Multiple wounds. Surrounding erythema. Psychiatric: He has a normal mood and affect. His behavior is normal.  Diminished pulses RLE, poor cap refill    LABS: Results for orders placed or performed during the hospital encounter of  04/02/21 (from the past 48 hour(s))  CBC with Differential     Status: Abnormal   Collection Time: 04/02/21  6:01 PM  Result Value Ref Range   WBC 6.6 4.0 - 10.5 K/uL   RBC 3.01 (L) 4.22 - 5.81 MIL/uL   Hemoglobin 9.4 (L) 13.0 - 17.0 g/dL   HCT 29.5 (L) 39.0 - 52.0 %   MCV 98.0 80.0 - 100.0 fL   MCH 31.2 26.0 - 34.0 pg   MCHC 31.9 30.0 - 36.0 g/dL   RDW 17.3 (H) 11.5 - 15.5 %   Platelets 234 150 - 400 K/uL   nRBC 0.0 0.0 - 0.2 %   Neutrophils Relative % 74 %   Neutro Abs 4.9 1.7 - 7.7 K/uL   Lymphocytes Relative 12 %   Lymphs Abs 0.8 0.7 - 4.0 K/uL   Monocytes Relative 11 %   Monocytes Absolute 0.7 0.1 - 1.0 K/uL   Eosinophils Relative 2 %   Eosinophils Absolute 0.1 0.0 - 0.5 K/uL   Basophils Relative 1 %   Basophils Absolute 0.1 0.0 - 0.1 K/uL   Immature Granulocytes 0 %   Abs Immature Granulocytes 0.02 0.00 - 0.07 K/uL    Comment: Performed at [facility], [address] [place], [place] [address]  Comprehensive metabolic panel     Status: Abnormal   Collection Time: 04/02/21  6:01 PM  Result Value Ref Range   Sodium 135 135 - 145 mmol/L    Potassium 4.4 3.5 - 5.1 mmol/L   Chloride 99 98 - 111 mmol/L   CO2 23 22 - 32 mmol/L   Glucose, Bld 216 (H) 70 - 99 mg/dL    Comment: Glucose reference range applies only to samples taken after fasting for at least 8 hours.   BUN 39 (H) 6 - 20 mg/dL   Creatinine, Ser 4.82 (H) 0.61 - 1.24 mg/dL   Calcium 8.3 (L) 8.9 - 10.3 mg/dL   Total Protein 6.3 (L) 6.5 - 8.1 g/dL   Albumin 2.6 (L) 3.5 - 5.0 g/dL   AST 19 15 - 41 U/L   ALT 16 0 - 44 U/L   Alkaline Phosphatase 119 38 - 126 U/L   Total Bilirubin 0.8 0.3 - 1.2 mg/dL   GFR, Estimated 13 (L) >60 mL/min    Comment: (NOTE) Calculated using the CKD-EPI Creatinine Equation (2021)    Anion gap 13 5 - 15    Comment: Performed at [facility], [address], [place], [place] [address]  Culture, blood (routine x 2)     Status: None (Preliminary result)   Collection Time: 04/02/21  6:01 PM   Specimen: Left Antecubital; Blood  Result Value Ref Range   Specimen Description LEFT ANTECUBITAL    Special Requests      BOTTLES DRAWN AEROBIC AND ANAEROBIC Blood Culture results may not be optimal due to an excessive volume of blood received in culture bottles   Culture      NO GROWTH < 12 HOURS Performed at [facility], [address] [place], [place] [address]    Report Status PENDING   Lactic acid, plasma     Status: None   Collection Time: 04/02/21  6:01 PM  Result Value Ref Range   Lactic Acid, Venous 1.1 0.5 - 1.9 mmol/L    Comment: Performed at [facility], [address] [place], [place] [address]  Culture, blood (routine x 2)     Status: None (Preliminary result)  Collection Time: 04/02/21  8:15 PM   Specimen: BLOOD  Result Value Ref Range   Specimen Description BLOOD BLOOD LEFT HAND    Special Requests      BOTTLES DRAWN AEROBIC AND ANAEROBIC Blood Culture adequate volume   Culture      NO GROWTH < 12 HOURS Performed at [facility], [address] [place], [place] [address]     Report Status PENDING   SARS CORONAVIRUS 2 (TAT 6-24 HRS) Nasopharyngeal Nasopharyngeal Swab     Status: None   Collection Time: 04/02/21  8:20 PM   Specimen: Nasopharyngeal Swab  Result Value Ref Range   SARS Coronavirus 2 NEGATIVE NEGATIVE    Comment: (NOTE) SARS-CoV-2 target nucleic acids are NOT DETECTED.  The SARS-CoV-2 RNA is generally detectable in upper and lower respiratory specimens during the acute phase of infection. Negative results do not preclude SARS-CoV-2 infection, do not rule out co-infections with other pathogens, and should not be used as the sole basis for treatment or other patient management decisions. Negative results must be combined with clinical observations, patient history, and epidemiological information. The expected result is Negative.  Fact Sheet for Patients: SugarRoll.be  Fact Sheet for Healthcare Providers: https://www.woods-mathews.com/  This test is not yet approved or cleared by the [place] FDA and  has been authorized for detection and/or diagnosis of SARS-CoV-2 by FDA under an Emergency Use Authorization (EUA). This EUA will remain  in effect (meaning this test can be used) for the duration of the COVID-19 declaration under Se ction 564(b)(1) of the Act, 21 U.S.C. section 360bbb-3(b)(1), unless the authorization is terminated or revoked sooner.  Performed at [facility], [place] [address] [place], [place] [address]   CBG monitoring, ED     Status: Abnormal   Collection Time: 04/02/21 11:03 PM  Result Value Ref Range   Glucose-Capillary 186 (H) 70 - 99 mg/dL    Comment: Glucose reference range applies only to samples taken after fasting for at least 8 hours.   Comment 1 Notify RN    Comment 2 Document in Chart   Basic metabolic panel     Status: Abnormal   Collection Time: 04/03/21  5:43 AM  Result Value Ref Range   Sodium 138 135 - 145 mmol/L   Potassium 4.2 3.5 - 5.1 mmol/L    Chloride 101 98 - 111 mmol/L   CO2 25 22 - 32 mmol/L   Glucose, Bld 55 (L) 70 - 99 mg/dL    Comment: Glucose reference range applies only to samples taken after fasting for at least 8 hours.   BUN 49 (H) 6 - 20 mg/dL   Creatinine, Ser 5.30 (H) 0.61 - 1.24 mg/dL   Calcium 8.4 (L) 8.9 - 10.3 mg/dL   GFR, Estimated 12 (L) >60 mL/min    Comment: (NOTE) Calculated using the CKD-EPI Creatinine Equation (2021)    Anion gap 12 5 - 15    Comment: Performed at [facility], [address] [place], [place] [address]  CBC     Status: Abnormal   Collection Time: 04/03/21  5:43 AM  Result Value Ref Range   WBC 5.9 4.0 - 10.5 K/uL   RBC 3.00 (L) 4.22 - 5.81 MIL/uL   Hemoglobin 9.4 (L) 13.0 - 17.0 g/dL   HCT 29.6 (L) 39.0 - 52.0 %   MCV 98.7 80.0 - 100.0 fL   MCH 31.3 26.0 - 34.0 pg   MCHC 31.8 30.0 - 36.0 g/dL  RDW 17.6 (H) 11.5 - 15.5 %   Platelets 211 150 - 400 K/uL   nRBC 0.0 0.0 - 0.2 %    Comment: Performed at [facility], [address] [place], [place] [address]  Protime-INR     Status: Abnormal   Collection Time: 04/03/21  5:43 AM  Result Value Ref Range   Prothrombin Time 17.0 (H) 11.4 - 15.2 seconds   INR 1.4 (H) 0.8 - 1.2    Comment: (NOTE) INR goal varies based on device and disease states. Performed at [facility], [address] [place], [place] [address]   APTT     Status: Abnormal   Collection Time: 04/03/21  5:43 AM  Result Value Ref Range   aPTT 40 (H) 24 - 36 seconds    Comment:        IF BASELINE aPTT IS ELEVATED, SUGGEST PATIENT RISK ASSESSMENT BE USED TO DETERMINE APPROPRIATE ANTICOAGULANT THERAPY. Performed at [facility], [address] [place], [place] [address]   Glucose, capillary     Status: None   Collection Time: 04/03/21  7:58 AM  Result Value Ref Range   Glucose-Capillary 75 70 - 99 mg/dL    Comment: Glucose reference range applies only to samples taken after fasting for at least 8 hours.  Glucose,  capillary     Status: Abnormal   Collection Time: 04/03/21 12:32 PM  Result Value Ref Range   Glucose-Capillary 186 (H) 70 - 99 mg/dL    Comment: Glucose reference range applies only to samples taken after fasting for at least 8 hours.   No components found for: ESR, C REACTIVE PROTEIN MICRO: Recent Results (from the past 720 hour(s))  Resp Panel by RT-PCR (Flu A&B, Covid) Nasopharyngeal Swab     Status: None   Collection Time: 03/20/21  6:17 PM   Specimen: Nasopharyngeal Swab; Nasopharyngeal(NP) swabs in vial transport medium  Result Value Ref Range Status   SARS Coronavirus 2 by RT PCR NEGATIVE NEGATIVE Final    Comment: (NOTE) SARS-CoV-2 target nucleic acids are NOT DETECTED.  The SARS-CoV-2 RNA is generally detectable in upper respiratory specimens during the acute phase of infection. The lowest concentration of SARS-CoV-2 viral copies this assay can detect is 138 copies/mL. A negative result does not preclude SARS-Cov-2 infection and should not be used as the sole basis for treatment or other patient management decisions. A negative result may occur with  improper specimen collection/handling, submission of specimen other than nasopharyngeal swab, presence of viral mutation(s) within the areas targeted by this assay, and inadequate number of viral copies(<138 copies/mL). A negative result must be combined with clinical observations, patient history, and epidemiological information. The expected result is Negative.  Fact Sheet for Patients:  EntrepreneurPulse.com.au  Fact Sheet for Healthcare Providers:  IncredibleEmployment.be  This test is no t yet approved or cleared by the [place] FDA and  has been authorized for detection and/or diagnosis of SARS-CoV-2 by FDA under an Emergency Use Authorization (EUA). This EUA will remain  in effect (meaning this test can be used) for the duration of the COVID-19 declaration under Section  564(b)(1) of the Act, 21 U.S.C.section 360bbb-3(b)(1), unless the authorization is terminated  or revoked sooner.       Influenza A by PCR NEGATIVE NEGATIVE Final   Influenza B by PCR NEGATIVE NEGATIVE Final    Comment: (NOTE) The Xpert Xpress SARS-CoV-2/FLU/RSV plus assay is intended as an aid in the diagnosis of influenza from Nasopharyngeal swab specimens  and should not be used as a sole basis for treatment. Nasal washings and aspirates are unacceptable for Xpert Xpress SARS-CoV-2/FLU/RSV testing.  Fact Sheet for Patients: EntrepreneurPulse.com.au  Fact Sheet for Healthcare Providers: IncredibleEmployment.be  This test is not yet approved or cleared by the [place] FDA and has been authorized for detection and/or diagnosis of SARS-CoV-2 by FDA under an Emergency Use Authorization (EUA). This EUA will remain in effect (meaning this test can be used) for the duration of the COVID-19 declaration under Section 564(b)(1) of the Act, 21 U.S.C. section 360bbb-3(b)(1), unless the authorization is terminated or revoked.  Performed at [facility], [address] [place], [place] [address]   Resp Panel by RT-PCR (Flu A&B, Covid) Nasopharyngeal Swab     Status: None   Collection Time: 03/25/21 12:14 PM   Specimen: Nasopharyngeal Swab; Nasopharyngeal(NP) swabs in vial transport medium  Result Value Ref Range Status   SARS Coronavirus 2 by RT PCR NEGATIVE NEGATIVE Final    Comment: (NOTE) SARS-CoV-2 target nucleic acids are NOT DETECTED.  The SARS-CoV-2 RNA is generally detectable in upper respiratory specimens during the acute phase of infection. The lowest concentration of SARS-CoV-2 viral copies this assay can detect is 138 copies/mL. A negative result does not preclude SARS-Cov-2 infection and should not be used as the sole basis for treatment or other patient management decisions. A negative result may occur with  improper  specimen collection/handling, submission of specimen other than nasopharyngeal swab, presence of viral mutation(s) within the areas targeted by this assay, and inadequate number of viral copies(<138 copies/mL). A negative result must be combined with clinical observations, patient history, and epidemiological information. The expected result is Negative.  Fact Sheet for Patients:  EntrepreneurPulse.com.au  Fact Sheet for Healthcare Providers:  IncredibleEmployment.be  This test is no t yet approved or cleared by the [place] FDA and  has been authorized for detection and/or diagnosis of SARS-CoV-2 by FDA under an Emergency Use Authorization (EUA). This EUA will remain  in effect (meaning this test can be used) for the duration of the COVID-19 declaration under Section 564(b)(1) of the Act, 21 U.S.C.section 360bbb-3(b)(1), unless the authorization is terminated  or revoked sooner.       Influenza A by PCR NEGATIVE NEGATIVE Final   Influenza B by PCR NEGATIVE NEGATIVE Final    Comment: (NOTE) The Xpert Xpress SARS-CoV-2/FLU/RSV plus assay is intended as an aid in the diagnosis of influenza from Nasopharyngeal swab specimens and should not be used as a sole basis for treatment. Nasal washings and aspirates are unacceptable for Xpert Xpress SARS-CoV-2/FLU/RSV testing.  Fact Sheet for Patients: EntrepreneurPulse.com.au  Fact Sheet for Healthcare Providers: IncredibleEmployment.be  This test is not yet approved or cleared by the [place] FDA and has been authorized for detection and/or diagnosis of SARS-CoV-2 by FDA under an Emergency Use Authorization (EUA). This EUA will remain in effect (meaning this test can be used) for the duration of the COVID-19 declaration under Section 564(b)(1) of the Act, 21 U.S.C. section 360bbb-3(b)(1), unless the authorization is terminated or revoked.  Performed at  [facility], [address] [place], [place] [address]   Culture, blood (routine x 2)     Status: None (Preliminary result)   Collection Time: 04/02/21  6:01 PM   Specimen: Left Antecubital; Blood  Result Value Ref Range Status   Specimen Description LEFT ANTECUBITAL  Final   Special Requests   Final    BOTTLES DRAWN AEROBIC AND ANAEROBIC Blood  Culture results may not be optimal due to an excessive volume of blood received in culture bottles   Culture   Final    NO GROWTH < 12 HOURS Performed at [facility], [address] [place], [place] [address]    Report Status PENDING  Incomplete  Culture, blood (routine x 2)     Status: None (Preliminary result)   Collection Time: 04/02/21  8:15 PM   Specimen: BLOOD  Result Value Ref Range Status   Specimen Description BLOOD BLOOD LEFT HAND  Final   Special Requests   Final    BOTTLES DRAWN AEROBIC AND ANAEROBIC Blood Culture adequate volume   Culture   Final    NO GROWTH < 12 HOURS Performed at [facility], [address], [place], [place] [address]    Report Status PENDING  Incomplete  SARS CORONAVIRUS 2 (TAT 6-24 HRS) Nasopharyngeal Nasopharyngeal Swab     Status: None   Collection Time: 04/02/21  8:20 PM   Specimen: Nasopharyngeal Swab  Result Value Ref Range Status   SARS Coronavirus 2 NEGATIVE NEGATIVE Final    Comment: (NOTE) SARS-CoV-2 target nucleic acids are NOT DETECTED.  The SARS-CoV-2 RNA is generally detectable in upper and lower respiratory specimens during the acute phase of infection. Negative results do not preclude SARS-CoV-2 infection, do not rule out co-infections with other pathogens, and should not be used as the sole basis for treatment or other patient management decisions. Negative results must be combined with clinical observations, patient history, and epidemiological information. The expected result is Negative.  Fact Sheet for  Patients: SugarRoll.be  Fact Sheet for Healthcare Providers: https://www.woods-mathews.com/  This test is not yet approved or cleared by the [place] FDA and  has been authorized for detection and/or diagnosis of SARS-CoV-2 by FDA under an Emergency Use Authorization (EUA). This EUA will remain  in effect (meaning this test can be used) for the duration of the COVID-19 declaration under Se ction 564(b)(1) of the Act, 21 U.S.C. section 360bbb-3(b)(1), unless the authorization is terminated or revoked sooner.  Performed at [facility], [place] [address] [place], [place] [address]     IMAGING: DG Chest 1 View  Result Date: 03/20/2021 CLINICAL DATA:  Chest pain leg fracture EXAM: CHEST  1 VIEW COMPARISON:  02/12/2021 FINDINGS: Cardiomegaly with vascular congestion and mild interstitial edema. No consolidation or pneumothorax IMPRESSION: Cardiomegaly with vascular congestion and mild interstitial edema. Electronically Signed   By: Donavan Foil M.D.   On: 03/20/2021 19:43   DG Tibia/Fibula Left  Result Date: 03/21/2021 CLINICAL DATA:  Surgery.  Tibial fracture EXAM: DG C-ARM 1-60 MIN; LEFT TIBIA AND FIBULA - 2 VIEW FLUOROSCOPY TIME:  Fluoroscopy Time:  1 minutes 59 seconds. Number of Acquired Spot Images: 6 COMPARISON:  March 20, 2021. FINDINGS: Six C-arm fluoroscopic images were obtained intraoperatively and submitted for post operative interpretation. These images demonstrate intramedullary rod and screw fixation of a spiral fracture of the distal tibial diaphysis. Improved, near anatomic alignment. Fibular neck fracture was better characterized on prior radiographs. Please see the performing provider's procedural report for further detail. IMPRESSION: Intraoperative fluoroscopy, as detailed above. Electronically Signed   By: Margaretha Sheffield MD   On: 03/21/2021 17:23   DG Tibia/Fibula Left  Result Date: 03/20/2021 CLINICAL DATA:  Fall with  fracture on Sunday night.  Re-injured. EXAM: LEFT TIBIA AND FIBULA - 2 VIEW COMPARISON:  Radiograph 03/16/2021 FINDINGS: Displaced spiral fracture of the distal tibial shaft has increased  displacement from prior exam. No evidence of intra-articular extension, no ankle joint effusion. There is both a healed remote is well as acute fracture of the fibular neck. Acute component was not seen on prior. There is posterior splint material in place. Bones diffusely under mineralized. IMPRESSION: 1. Displaced spiral fracture of the distal tibial shaft, increased displacement from prior exam. 2. New acute fracture of the proximal fibular neck. This was not seen on prior exam, there is a chronic healed proximal fibular fracture. Electronically Signed   By: Keith Rake M.D.   On: 03/20/2021 19:26   DG Tibia/Fibula Left  Result Date: 03/16/2021 CLINICAL DATA:  Left lower leg injury. Slipped getting into bed today. EXAM: LEFT TIBIA AND FIBULA - 2 VIEW COMPARISON:  None. FINDINGS: Mildly displaced spiral fracture of the distal tibial metadiaphysis. Lateral displacement of 1-2 mm of the distal fracture fragment. No intra-articular component is visualized. Proximal fibular shaft fracture is remote. No acute fibular fracture. Knee and ankle alignment are maintained. The bones are diffusely under mineralized. There is soft tissue edema at the fracture site anteriorly, and to a lesser extent diffusely throughout the lower leg. IMPRESSION: 1. Mildly displaced spiral fracture of the distal tibial metadiaphysis. No intra-articular component visualized by radiograph. 2. Proximal fibular shaft fracture is healed/remote. Electronically Signed   By: Keith Rake M.D.   On: 03/16/2021 23:29   DG Abd 1 View  Result Date: 03/25/2021 CLINICAL DATA:  Abdominal pain EXAM: ABDOMEN - 1 VIEW COMPARISON:  October 27, 2012 FINDINGS: There is moderate stool in the colon. There is no bowel dilatation or air-fluid level to suggest bowel  obstruction. No free air. There is extensive aortic and iliac artery vascular calcification. There is also calcification in multiple smaller arterial vessels. There is postoperative change in the proximal right femur. Bones are osteoporotic. IMPRESSION: Widespread arterial vascular calcification. No bowel obstruction or free air evident on supine examination. Bones osteoporotic. Aortic Atherosclerosis (ICD10-I70.0). Electronically Signed   By: Lowella Grip III M.D.   On: 03/25/2021 16:00   US RENAL  Result Date: 03/04/2021 CLINICAL DATA:  Distended urinary bladder EXAM: RENAL / URINARY TRACT ULTRASOUND COMPLETE COMPARISON:  CT 02/06/2021 FINDINGS: Right Kidney: Renal measurements: 10.4 x 4.8 x 5.3 cm = volume: 137 mL. Cortex is echogenic. Mild diffuse cortical thinning. No mass or hydronephrosis. Shadowing calcifications within the central right kidney either due to stones or calcified vessels as seen on CT. Left Kidney: Surgically absent Bladder: Decompressed. Other: Calcifications in the spleen which may be vascular. Small left pleural effusion IMPRESSION: 1. Status post left nephrectomy. 2. Echogenic right kidney with mild cortical thinning consistent with medical renal disease. No hydronephrosis. Intrarenal shadowing calcifications, favored to represent intravascular calcifications over stones given findings on CT from February. 3. Small left pleural effusion Electronically Signed   By: Donavan Foil M.D.   On: 03/04/2021 17:56   US Venous Img Lower Unilateral Right  Result Date: 04/02/2021 CLINICAL DATA:  Right lower extremity edema for 1 week EXAM: RIGHT LOWER EXTREMITY VENOUS DOPPLER ULTRASOUND TECHNIQUE: Gray-scale sonography with compression, as well as color and duplex ultrasound, were performed to evaluate the deep venous system(s) from the level of the common femoral vein through the popliteal and proximal calf veins. COMPARISON:  None. FINDINGS: VENOUS Normal compressibility of the common  femoral, superficial femoral, and popliteal veins, as well as the visualized calf veins. Visualized portions of profunda femoral vein and great saphenous vein unremarkable. No filling defects to suggest DVT on  grayscale or color Doppler imaging. Doppler waveforms show normal direction of venous flow, normal respiratory plasticity and response to augmentation. Limited views of the contralateral common femoral vein are unremarkable. OTHER None. Limitations: none IMPRESSION: 1. No evidence of deep venous thrombosis within the right lower extremity. Electronically Signed   By: Randa Ngo M.D.   On: 04/02/2021 21:05   DG Tibia/Fibula Left Port  Result Date: 03/21/2021 CLINICAL DATA:  Post ORIF. EXAM: PORTABLE LEFT TIBIA AND FIBULA - 2 VIEW COMPARISON:  Intraoperative radiographs earlier the same date and 03/20/2021. FINDINGS: Left tibial intramedullary nail is secured by 2 proximal and 2 distal interlocking screws. The hardware appears well positioned. There is improved alignment of the spiral fracture of the mid to distal tibial diaphysis. Nondisplaced fracture of the proximal fibula appears unchanged. There is underlying posttraumatic deformity of the proximal fibular diaphysis. No complications are identified. Diffuse vascular calcifications are noted. There are skin staples anterior to the patellar tendon. IMPRESSION: Improved alignment of the tibial fracture post ORIF. No demonstrated complication. Electronically Signed   By: Richardean Sale M.D.   On: 03/21/2021 17:29   DG Foot 2 Views Right  Result Date: 04/02/2021 CLINICAL DATA:  Right foot swelling greatest at first digit EXAM: RIGHT FOOT - 2 VIEW COMPARISON:  None. FINDINGS: Frontal and lateral views of the right foot are obtained. The bones are diffusely osteopenic. Prior healed second through fifth metatarsal fractures. No acute bony abnormalities. There is diffuse interphalangeal joint space narrowing, as well as prominent joint space narrowing  and osteophyte formation of the first metatarsophalangeal joint, most consistent with osteoarthritis. Large inferior calcaneal spur. Ulceration dorsal aspect of the forefoot. Diffuse soft tissue swelling of the forefoot greatest at the first digit. IMPRESSION: 1. Forefoot soft tissue swelling and dorsal ulceration. 2. No acute or destructive bony lesions. 3. Multifocal osteoarthritis. 4. Osteopenia. Electronically Signed   By: Randa Ngo M.D.   On: 04/02/2021 19:04   DG C-Arm 1-60 Min  Result Date: 03/21/2021 CLINICAL DATA:  Surgery.  Tibial fracture EXAM: DG C-ARM 1-60 MIN; LEFT TIBIA AND FIBULA - 2 VIEW FLUOROSCOPY TIME:  Fluoroscopy Time:  1 minutes 59 seconds. Number of Acquired Spot Images: 6 COMPARISON:  March 20, 2021. FINDINGS: Six C-arm fluoroscopic images were obtained intraoperatively and submitted for post operative interpretation. These images demonstrate intramedullary rod and screw fixation of a spiral fracture of the distal tibial diaphysis. Improved, near anatomic alignment. Fibular neck fracture was better characterized on prior radiographs. Please see the performing provider's procedural report for further detail. IMPRESSION: Intraoperative fluoroscopy, as detailed above. Electronically Signed   By: Margaretha Sheffield MD   On: 03/21/2021 17:23   ECHOCARDIOGRAM COMPLETE  Result Date: 03/21/2021    ECHOCARDIOGRAM REPORT   Patient Name:   ANIELLO CHRISTOPOULOS Date of Exam: 03/21/2021 Medical Rec #:  [id number]       Height:       68.0 in Accession #:    [id number]      Weight:       158.3 lb Date of Birth:  [birth year]       BSA:          1.850 m Patient Age:    20 years        BP:           158/53 mmHg Patient Gender: M               HR:  62 bpm. Exam Location:  [facility] Procedure: 2D Echo, Color Doppler, Cardiac Doppler and Strain Analysis Indications:     I09.73 CHF-Acute Systolic  History:         Patient has prior history of Echocardiogram examinations. CHF,                  CAD, TIA and  CKD; Risk Factors:Hypertension and Dyslipidemia.  Sonographer:     Charmayne Sheer RDCS (AE) Referring Phys:  [place] Diagnosing Phys: Isaias Cowman MD IMPRESSIONS  1. Left ventricular ejection fraction, by estimation, is 55 to 60%. The left ventricle has normal function. The left ventricle has no regional wall motion abnormalities. Left ventricular diastolic parameters were normal.  2. Right ventricular systolic function is normal. The right ventricular size is normal.  3. The mitral valve is normal in structure. Mild to moderate mitral valve regurgitation. No evidence of mitral stenosis.  4. Tricuspid valve regurgitation is mild to moderate. Mild tricuspid stenosis.  5. The aortic valve is normal in structure. Aortic valve regurgitation is not visualized. Mild to moderate aortic valve sclerosis/calcification is present, without any evidence of aortic stenosis.  6. The inferior vena cava is normal in size with greater than 50% respiratory variability, suggesting right atrial pressure of 3 mmHg. FINDINGS  Left Ventricle: Left ventricular ejection fraction, by estimation, is 55 to 60%. The left ventricle has normal function. The left ventricle has no regional wall motion abnormalities. The left ventricular internal cavity size was normal in size. There is  no left ventricular hypertrophy. Left ventricular diastolic parameters were normal. Right Ventricle: The right ventricular size is normal. No increase in right ventricular wall thickness. Right ventricular systolic function is normal. Left Atrium: Left atrial size was normal in size. Right Atrium: Right atrial size was normal in size. Pericardium: There is no evidence of pericardial effusion. Mitral Valve: The mitral valve is normal in structure. Mild to moderate mitral valve regurgitation. No evidence of mitral valve stenosis. MV peak gradient, 3.8 mmHg. The mean mitral valve gradient is 2.0 mmHg. Tricuspid Valve: The tricuspid valve is normal in  structure. Tricuspid valve regurgitation is mild to moderate. Mild tricuspid stenosis. Aortic Valve: The aortic valve is normal in structure. Aortic valve regurgitation is not visualized. Mild to moderate aortic valve sclerosis/calcification is present, without any evidence of aortic stenosis. Aortic valve mean gradient measures 5.0 mmHg. Aortic valve peak gradient measures 8.6 mmHg. Aortic valve area, by VTI measures 4.23 cm. Pulmonic Valve: The pulmonic valve was normal in structure. Pulmonic valve regurgitation is not visualized. No evidence of pulmonic stenosis. Aorta: The aortic root is normal in size and structure. Venous: The inferior vena cava is normal in size with greater than 50% respiratory variability, suggesting right atrial pressure of 3 mmHg. IAS/Shunts: No atrial level shunt detected by color flow Doppler.  LEFT VENTRICLE PLAX 2D LVIDd:         5.60 cm  Diastology LVIDs:         4.00 cm  LV e' medial:    3.81 cm/s LV PW:         1.60 cm  LV E/e' medial:  26.8 LV IVS:        1.30 cm  LV e' lateral:   6.64 cm/s LVOT diam:     2.50 cm  LV E/e' lateral: 15.4 LV SV:         120 LV SV Index:   65 LVOT Area:     4.91  cm  RIGHT VENTRICLE RV Basal diam:  3.80 cm LEFT ATRIUM             Index       RIGHT ATRIUM           Index LA diam:        4.70 cm 2.54 cm/m  RA Area:     24.00 cm LA Vol (A2C):   68.3 ml 36.91 ml/m RA Volume:   76.20 ml  41.18 ml/m LA Vol (A4C):   64.5 ml 34.86 ml/m LA Biplane Vol: 68.1 ml 36.80 ml/m  AORTIC VALVE                   PULMONIC VALVE AV Area (Vmax):    3.81 cm    PV Vmax:       1.00 m/s AV Area (Vmean):   3.65 cm    PV Vmean:      62.100 cm/s AV Area (VTI):     4.23 cm    PV VTI:        0.178 m AV Vmax:           147.00 cm/s PV Peak grad:  4.0 mmHg AV Vmean:          98.600 cm/s PV Mean grad:  2.0 mmHg AV VTI:            0.283 m AV Peak Grad:      8.6 mmHg AV Mean Grad:      5.0 mmHg LVOT Vmax:         114.00 cm/s LVOT Vmean:        73.400 cm/s LVOT VTI:           0.244 m LVOT/AV VTI ratio: 0.86  AORTA Ao Root diam: 3.60 cm MITRAL VALVE                 TRICUSPID VALVE MV Area (PHT): 4.15 cm      TR Peak grad:   34.3 mmHg MV Area VTI:   3.27 cm      TR Vmax:        293.00 cm/s MV Peak grad:  3.8 mmHg MV Mean grad:  2.0 mmHg      SHUNTS MV Vmax:       0.97 m/s      Systemic VTI:  0.24 m MV Vmean:      69.3 cm/s     Systemic Diam: 2.50 cm MV Decel Time: 183 msec MR Peak grad:    139.7 mmHg MR Mean grad:    81.0 mmHg MR Vmax:         591.00 cm/s MR Vmean:        420.0 cm/s MR PISA:         1.57 cm MR PISA Eff ROA: 8 mm MR PISA Radius:  0.50 cm MV E velocity: 102.00 cm/s MV A velocity: 108.00 cm/s MV E/A ratio:  0.94 Isaias Cowman MD Electronically signed by Isaias Cowman MD Signature Date/Time: 03/21/2021/11:50:59 AM    Final     Assessment:   Richard Zavala is a 60 y.o. male with ESRD, DM complicated by PN and multiple other medical issues (CHF, coronary artery disease, hypertension, dyslipidemia, peripheral neuropathy, CVA and BPH) now with nonhealing ulcers of RLE. On exam he has diminished DP and TP pulses, cool foot and poor cap refill. Suspect PAD with overlying cellulitis  Recommendations I have done culture on the largest wound to rule out resistant  organisms Cont vacno cefepime.  Podiatry to see. Would consult vascular as well  Thank you very much for allowing me to participate in the care of this patient. Please call with questions.   Cheral Marker. Ola Spurr, MD

## 2021-04-03 NOTE — H&P (View-Only) (Signed)
[facility] Consult Note  MRN : [id number]  Richard Zavala is a 60 y.o. (08-Nov-1961) male who presents with chief complaint of  Chief Complaint  Patient presents with  . Leg Swelling  .  History of Present Illness:   I am asked to evaluate the patient by Dr. Kurtis Bushman.  Patient is a 60 year old gentleman with end-stage renal disease on hemodialysis who presented to [facility] with multiple ulcers and signs and symptoms consistent with infection of the right foot.  He has been evaluated by both the hospitalist service as well as Dr. Ola Spurr for infectious disease and both have raised concern regarding atherosclerotic occlusive disease.  I am asked to evaluate the patient for painful lower extremities and diminished pulses associated with ulceration of the foot.  The patient notes the ulcer has been present for multiple weeks and has not been improving.  It is very painful and has had some drainage.  No specific history of trauma noted by the patient.  The patient denies fever or chills.  the patient does have diabetes which has been difficult to control.  The patient denies rest pain or dangling of an extremity off the side of the bed during the night for relief. No prior interventions or surgeries.  No history of back problems or DJD of the lumbar sacral spine.   The patient denies amaurosis fugax or recent TIA symptoms. There are no recent neurological changes noted. The patient denies history of DVT, PE or superficial thrombophlebitis. The patient denies recent episodes of angina or shortness of breath.   Current Facility-Administered Medications  Medication Dose Route Frequency Provider Last Rate Last Admin  . 0.9 %  sodium chloride infusion  250 mL Intravenous PRN Mansy, Jan A, MD      . acetaminophen (TYLENOL) tablet 650 mg  650 mg Oral Q6H PRN Mansy, Jan A, MD   650 mg at 04/03/21 1059   Or  . acetaminophen (TYLENOL)  suppository 650 mg  650 mg Rectal Q6H PRN Mansy, Jan A, MD      . albuterol (VENTOLIN HFA) 108 (90 Base) MCG/ACT inhaler 2 puff  2 puff Inhalation Q6H PRN Mansy, Jan A, MD      . atorvastatin (LIPITOR) tablet 80 mg  80 mg Oral QHS Mansy, Jan A, MD   80 mg at 04/03/21 2026  . ceFEPIme (MAXIPIME) 1 g in sodium chloride 0.9 % 100 mL IVPB  1 g Intravenous Q24H Darnelle Bos, RPH   Stopped at 04/03/21 1434  . heparin injection 5,000 Units  5,000 Units Subcutaneous Q12H Mansy, Arvella Merles, MD   5,000 Units at 04/03/21 2026  . insulin aspart (novoLOG) injection 0-15 Units  0-15 Units Subcutaneous TID PC & HS Mansy, Arvella Merles, MD   5 Units at 04/03/21 1743  . insulin glargine (LANTUS) injection 10 Units  10 Units Subcutaneous Daily Mansy, Jan A, MD      . lidocaine (LIDODERM) 5 % 1 patch  1 patch Transdermal Daily Mansy, Jan A, MD   1 patch at 04/03/21 1059  . magnesium hydroxide (MILK OF MAGNESIA) suspension 30 mL  30 mL Oral Daily PRN Mansy, Jan A, MD      . morphine 2 MG/ML injection 1 mg  1 mg Intravenous Q4H PRN Nolberto Hanlon, MD      . ondansetron (ZOFRAN) tablet 4 mg  4 mg Oral Q6H PRN Mansy, Arvella Merles, MD       Or  .  ondansetron (ZOFRAN) injection 4 mg  4 mg Intravenous Q6H PRN Mansy, Jan A, MD      . oxyCODONE (Oxy IR/ROXICODONE) immediate release tablet 5 mg  5 mg Oral Q4H PRN Mansy, Jan A, MD      . pantoprazole (PROTONIX) EC tablet 40 mg  40 mg Oral BID AC Mansy, Jan A, MD   40 mg at 04/03/21 1743  . sodium chloride flush (NS) 0.9 % injection 3 mL  3 mL Intravenous Q12H Mansy, Jan A, MD   3 mL at 04/03/21 2026  . sodium chloride flush (NS) 0.9 % injection 3 mL  3 mL Intravenous PRN Mansy, Jan A, MD      . traZODone (DESYREL) tablet 25 mg  25 mg Oral QHS PRN Mansy, Jan A, MD      . Derrill Memo ON 04/04/2021] vancomycin (VANCOCIN) 750 mg in sodium chloride 0.9 % 250 mL IVPB  750 mg Intravenous Q M,W,F-HD Dorothe Pea, [facility]        Past Medical History:  Diagnosis Date  . Anemia   . Blind   . BPH (benign  prostatic hyperplasia)   . CAD (coronary artery disease)   . CHF (congestive heart failure) ([place])   . Chronic kidney disease (CKD), stage IV (severe) ([facility])    DIALYSIS  . Diabetes mellitus without complication ([place])   . ESRD (end stage renal disease) ([place])    Monday-Wednesday-Friday dialysis  . Hyperlipemia   . Hypertension   . Neuropathy   . Osteoporosis   . Pulmonary HTN ([place])   . Stroke [facility])    2013  . TIA (transient ischemic attack)   . TRD (traction retinal detachment)   . Wilms' tumor [facility])     Past Surgical History:  Procedure Laterality Date  . A/V FISTULAGRAM Left 01/28/2017   Procedure: A/V Fistulagram;  Surgeon: Algernon Huxley, MD;  Location: [place] [facility];  Service: Cardiovascular;  Laterality: Left;  . A/V FISTULAGRAM Left 04/28/2018   Procedure: A/V FISTULAGRAM;  Surgeon: Algernon Huxley, MD;  Location: [place] [facility];  Service: Cardiovascular;  Laterality: Left;  . A/V FISTULAGRAM N/A 03/21/2020   Procedure: A/V FISTULAGRAM;  Surgeon: Algernon Huxley, MD;  Location: [place] [facility];  Service: Cardiovascular;  Laterality: N/A;  . AV FISTULA PLACEMENT    . AV FISTULA PLACEMENT Right 11/23/2019   Procedure: ARTERIOVENOUS (AV) FISTULA CREATION (RADIOCEPHALIC );  Surgeon: Algernon Huxley, MD;  Location: [facility] ORS;  Service: Vascular;  Laterality: Right;  . CARDIAC CATHETERIZATION    . CATARACT EXTRACTION W/PHACO Right 05/14/2016   Procedure: CATARACT EXTRACTION PHACO AND INTRAOCULAR LENS PLACEMENT (IOC);  Surgeon: Eulogio Bear, MD;  Location: [facility] ORS;  Service: Ophthalmology;  Laterality: Right;  [place] 1.46 AP% 11.5 CDE 12.33 FLUID PACK LOT # [id number] H  . EYE SURGERY    . FRACTURE SURGERY     RIGHT LEG WITH ROD  . HIP SURGERY    . NEPHRECTOMY RADICAL    . PERIPHERAL VASCULAR CATHETERIZATION N/A 08/28/2015   Procedure: A/V Shuntogram/Fistulagram;  Surgeon: Algernon Huxley, MD;  Location: [place] [facility];  Service: Cardiovascular;  Laterality: N/A;  . PERIPHERAL  VASCULAR CATHETERIZATION Left 08/28/2015   Procedure: A/V Shunt Intervention;  Surgeon: Algernon Huxley, MD;  Location: [place] [facility];  Service: Cardiovascular;  Laterality: Left;  . TIBIA IM NAIL INSERTION Left 03/21/2021   Procedure: INTRAMEDULLARY (IM) NAIL TIBIAL;  Surgeon: Thornton Park, MD;  Location: [facility] OR[facility];  Service: Orthopedics;  Laterality: Left;  . UPPER EXTREMITY ANGIOGRAPHY Right 08/01/2020   Procedure: UPPER EXTREMITY ANGIOGRAPHY;  Surgeon: Algernon Huxley, MD;  Location: [place] [facility];  Service: Cardiovascular;  Laterality: Right;    Social History Social History   Tobacco Use  . Smoking status: Former Smoker    Packs/day: 1.00    Types: Cigarettes    Quit date: 06/26/2012    Years since quitting: 8.7  . Smokeless tobacco: Never Used  Vaping Use  . Vaping Use: Never used  Substance Use Topics  . Alcohol use: No  . Drug use: No    Family History Family History  Problem Relation Age of Onset  . CAD Father   . COPD Father   . CAD Paternal Grandfather   . Diabetes Maternal Aunt   . Diabetes Maternal Uncle   No family history of bleeding/clotting disorders, porphyria or autoimmune disease   Allergies  Allergen Reactions  . Metformin Other (See Comments)    Severe diarrhea   . Penicillins Hives and Other (See Comments)    Has patient had a PCN reaction causing immediate rash, facial/tongue/throat swelling, SOB or lightheadedness with hypotension:Yes Has patient had a PCN reaction causing severe rash involving mucus membranes or skin necrosis:Yes Has patient had a PCN reaction that required hospitalization:inpatient at the time of reaction. Has patient had a PCN reaction occurring within the last 10 years:No If all of the above answers are "NO", then may proceed with Cephalosporin use.      REVIEW OF SYSTEMS (Negative unless checked)  Constitutional: [] Weight loss  [] Fever  [] Chills Cardiac: [] Chest pain   [] Chest pressure   [] Palpitations    [] Shortness of breath when laying flat   [] Shortness of breath at rest   [] Shortness of breath with exertion. Vascular:  [x] Pain in legs with walking   [] Pain in legs at rest   [] Pain in legs when laying flat   [] Claudication   [] Pain in feet when walking  [] Pain in feet at rest  [] Pain in feet when laying flat   [] History of DVT   [] Phlebitis   [] Swelling in legs   [] Varicose veins   [x] Non-healing ulcers Pulmonary:   [] Uses home oxygen   [] Productive cough   [] Hemoptysis   [] Wheeze  [] COPD   [] Asthma Neurologic:  [] Dizziness  [] Blackouts   [] Seizures   [] History of stroke   [] History of TIA  [] Aphasia   [] Temporary blindness   [] Dysphagia   [] Weakness or numbness in arms   [x] Weakness or numbness in legs Musculoskeletal:  [] Arthritis   [] Joint swelling   [x] Joint pain   [] Low back pain Hematologic:  [] Easy bruising  [] Easy bleeding   [] Hypercoagulable state   [] Anemic  [] Hepatitis Gastrointestinal:  [] Blood in stool   [] Vomiting blood  [] Gastroesophageal reflux/heartburn   [] Difficulty swallowing. Genitourinary:  [x] Chronic kidney disease   [] Difficult urination  [] Frequent urination  [] Burning with urination   [] Blood in urine Skin:  [] Rashes   [] Ulcers   [] Wounds Psychological:  [] History of anxiety   []  History of major depression.    Physical Examination  Vitals:   04/03/21 1706 04/03/21 1855 04/03/21 1934 04/03/21 2036  BP: (!) 153/71 (!) 145/100 (!) 152/97 124/73  Pulse: 61 62 64 62  Resp: 18 18 18 16   Temp: 98.3 F (36.8 C)  97.8 F (36.6 C) 97.8 F (36.6 C)  TempSrc:   Oral Oral  SpO2:  99% 98% 100%  Weight:      Height:  Body mass index is 24.77 kg/m.  Head: [place]/AT, No temporalis wasting. Prominent temp pulse not noted. Ear/Nose/Throat: Nares w/o erythema or drainage, oropharynx w/o obsrtuction, Mallampati score: 3.  Dentition poor.  Eyes: PERRLA, Sclera nonicteric.  Neck: Supple, no nuchal rigidity.  No bruit or JVD.  Pulmonary:  Breath sounds equal bilaterally, no  use of accessory muscles.  Cardiac: RRR, normal S1, S2, no Murmurs, rubs or gallops. Vascular: Multiple ulcers right foot dorsal ulcer appears infected.  Pedal pulses on the right are nonpalpable.  Popliteal pulses nonpalpable. Gastrointestinal: soft, non-tender, non-distended.  Musculoskeletal: Moves all extremities.  No deformity or atrophy. No edema. Neurologic: CN 2-12 intact. Symmetrical.  Speech is fluent.  Psychiatric: Judgment intact, Mood & affect appropriate for pt's clinical situation. Dermatologic: No rashes or ulcers noted.  No cellulitis or open wounds.      CBC Lab Results  Component Value Date   WBC 5.9 04/03/2021   HGB 9.4 (L) 04/03/2021   HCT 29.6 (L) 04/03/2021   MCV 98.7 04/03/2021   PLT 211 04/03/2021    BMET    Component Value Date/Time   NA 138 04/03/2021 0543   NA 141 10/28/2012 0543   K 4.2 04/03/2021 0543   K 5.2 (H) 10/28/2012 0543   CL 101 04/03/2021 0543   CL 111 (H) 10/28/2012 0543   CO2 25 04/03/2021 0543   CO2 22 10/28/2012 0543   GLUCOSE 55 (L) 04/03/2021 0543   GLUCOSE 151 (H) 10/28/2012 0543   BUN 49 (H) 04/03/2021 0543   BUN 27 (H) 10/28/2012 0543   CREATININE 5.30 (H) 04/03/2021 0543   CREATININE 1.52 (H) 10/28/2012 0543   CALCIUM 8.4 (L) 04/03/2021 0543   CALCIUM 8.0 (L) 10/28/2012 0543   GFRNONAA 12 (L) 04/03/2021 0543   GFRNONAA 53 (L) 10/28/2012 0543   GFRAA 10 (L) 03/07/2020 1931   GFRAA >60 10/28/2012 0543   Estimated Creatinine Clearance: 14.7 mL/min (A) (by C-G formula based on SCr of 5.3 mg/dL (H)).  COAG Lab Results  Component Value Date   INR 1.4 (H) 04/03/2021   INR 1.4 (H) 03/20/2021   INR 1.3 (H) 02/07/2021    Assessment/Plan 1.  Atherosclerotic occlusive disease bilateral lower extremities with multiple ulcerations of the right foot: I will obtain noninvasive studies to assess his circulation.  However, given the appearance of his foot and the physical exam I believe it is highly likely he will require  angiography with intervention for improvement in his circulation to allow for wound healing.  I did discuss this with the patient.  ABIs will be evaluated initially and we will base further decisions on the results.  2.  Multiple right foot ulcers: Plan per wound care and infectious disease.  3.  End-stage renal disease: Patient's fistula is working well we will continue hemodialysis as arranged.  4.  Coronary artery disease: Continue cardiac and antihypertensive medications as already ordered and reviewed, no changes at this time.  Continue statin as ordered and reviewed, no changes at this time  Nitrates PRN for chest pain    Hortencia Pilar, MD  04/03/2021 9:48 PM

## 2021-04-04 DIAGNOSIS — I10 Essential (primary) hypertension: Secondary | ICD-10-CM

## 2021-04-04 LAB — GLUCOSE, CAPILLARY
Glucose-Capillary: 269 mg/dL — ABNORMAL HIGH (ref 70–99)
Glucose-Capillary: 91 mg/dL (ref 70–99)
Glucose-Capillary: 115 mg/dL — ABNORMAL HIGH (ref 70–99)
Glucose-Capillary: 223 mg/dL — ABNORMAL HIGH (ref 70–99)

## 2021-04-04 MED ORDER — CHLORHEXIDINE GLUCONATE CLOTH 2 % EX PADS
6.0000 | MEDICATED_PAD | Freq: Every day | CUTANEOUS | Status: DC
  Administered 2021-04-06 – 2021-04-09 (×3): 6 via TOPICAL

## 2021-04-04 MED ORDER — PENTAFLUOROPROP-TETRAFLUOROETH EX AERO
1.0000 "application " | INHALATION_SPRAY | CUTANEOUS | Status: DC | PRN
  Filled 2021-04-04: qty 30

## 2021-04-04 MED ORDER — ALTEPLASE 2 MG IJ SOLR: 2.0000 mg | Freq: Once | INTRAMUSCULAR | Status: DC | PRN

## 2021-04-04 MED ORDER — SODIUM CHLORIDE 0.9 % IV SOLN: 100.0000 mL | INTRAVENOUS | Status: DC | PRN

## 2021-04-04 MED ORDER — LIDOCAINE HCL (PF) 1 % IJ SOLN
5.0000 mL | INTRAMUSCULAR | Status: DC | PRN
  Filled 2021-04-04: qty 5

## 2021-04-04 MED ORDER — HEPARIN SODIUM (PORCINE) 1000 UNIT/ML DIALYSIS: 1000.0000 [IU] | INTRAMUSCULAR | Status: DC | PRN

## 2021-04-04 MED ORDER — CARVEDILOL 12.5 MG PO TABS
12.5000 mg | ORAL_TABLET | Freq: Two times a day (BID) | ORAL | Status: DC
  Administered 2021-04-05 – 2021-04-10 (×7): 12.5 mg via ORAL
  Filled 2021-04-04 (×8): qty 1

## 2021-04-04 MED ORDER — LIDOCAINE-PRILOCAINE 2.5-2.5 % EX CREA
1.0000 "application " | TOPICAL_CREAM | CUTANEOUS | Status: DC | PRN
  Filled 2021-04-04: qty 5

## 2021-04-04 NOTE — Progress Notes (Signed)
PROGRESS NOTE    UNIQUE SEARFOSS  JOA:[id number] DOB: 04-23-1961 DOA: 04/02/2021 PCP: Midge Minium, PA    Brief Narrative:  Richard Zavala is a 60 y.o. Caucasian male with medical history significant for multiple medical problems that are mentioned below including CHF, coronary artery disease, end-stage renal disease on hemodialysis, hypertension, dyslipidemia, peripheral neuropathy, CVA and BPH, who presented to the ER with acute onset of worsening right foot swelling with erythema and mild extension to the right leg for last 4 days.  The patient had ORIF of the left leg during recent admission here and was discharged on 03/25/2021.  He has a right foot ulcer and scattered healed ulcers over his foot and leg.  He denied any fever or chills.  He has no problems currently from his left leg.  Earlier today he finished his session of hemodialysis that he gets on Monday, Wednesday and Fridays.  No chest pain or palpitations.  No reported cough or wheezing or dyspnea.  ED Course: Upon presentation to the ER, temperature was 100 with a blood pressure 137/99 with otherwise normal vital signs.  Labs revealed blood glucose of 216 and a BUN of 39 with creatinine 4.82 with anion gap of 13.  Albumin was 2.6.  Lactic acid was 1.1.  CBC showed anemia close to his baseline.  Blood culture was drawn.  4/14- no overnight issues. Saw pt this am. In the after noon nsg stated pts bp 81/68, repeat with manual 153/71.  Was planning to give bolus and midodrine, but d/c'd since repeat with manual was stable.  4/15-found with two chocolate bars son brought to him. Refused insulin. Discussed good glycemic control, but appears not to care what I am trying to discuss about it  Consultants:   ID, vascular surgery                                    Procedures:  Antimicrobials:      Subjective: No new complaints. Denies sob, cp , abd pain  Objective: Vitals:   04/03/21 1934 04/03/21 2036 04/04/21 0515  04/04/21 0805  BP: (!) 152/97 124/73 116/65 (!) 161/101  Pulse: 64 62 63 65  Resp: 18 16 18 16   Temp: 97.8 F (36.6 C) 97.8 F (36.6 C) 97.6 F (36.4 C) 97.9 F (36.6 C)  TempSrc: Oral Oral  Oral  SpO2: 98% 100% 98% 100%  Weight:      Height:        Intake/Output Summary (Last 24 hours) at 04/04/2021 0848 Last data filed at 04/04/2021 0510 Gross per 24 hour  Intake 705.57 ml  Output --  Net 705.57 ml   Filed Weights   04/02/21 1755 04/03/21 0130  Weight: 71 kg 73.9 kg    Examination: Aggitated, annoyed cta no w/r/r Regular S1-S2 no gallops Soft benign positive bowel sounds Lower extremity with mild edema, left lower extremity wrapped Awake and alert, grossly intact Mood and affect appropriate in current setting   Data Reviewed: I have personally reviewed following labs and imaging studies  CBC: Recent Labs  Lab 04/02/21 1801 04/03/21 0543  WBC 6.6 5.9  NEUTROABS 4.9  --   HGB 9.4* 9.4*  HCT 29.5* 29.6*  MCV 98.0 98.7  PLT 234 601   Basic Metabolic Panel: Recent Labs  Lab 04/02/21 1801 04/03/21 0543  NA 135 138  K 4.4 4.2  CL 99 101  CO2  23 25  GLUCOSE 216* 55*  BUN 39* 49*  CREATININE 4.82* 5.30*  CALCIUM 8.3* 8.4*   GFR: Estimated Creatinine Clearance: 14.7 mL/min (A) (by C-G formula based on SCr of 5.3 mg/dL (H)). Liver Function Tests: Recent Labs  Lab 04/02/21 1801  AST 19  ALT 16  ALKPHOS 119  BILITOT 0.8  PROT 6.3*  ALBUMIN 2.6*   No results for input(s): LIPASE, AMYLASE in the last 168 hours. No results for input(s): AMMONIA in the last 168 hours. Coagulation Profile: Recent Labs  Lab 04/03/21 0543  INR 1.4*   Cardiac Enzymes: No results for input(s): CKTOTAL, CKMB, CKMBINDEX, TROPONINI in the last 168 hours. BNP (last 3 results) No results for input(s): PROBNP in the last 8760 hours. HbA1C: No results for input(s): HGBA1C in the last 72 hours. CBG: Recent Labs  Lab 04/03/21 1232 04/03/21 1602 04/03/21 2051  04/03/21 2137 04/04/21 0740  GLUCAP 186* 214* 57* 84 269*   Lipid Profile: No results for input(s): CHOL, HDL, LDLCALC, TRIG, CHOLHDL, LDLDIRECT in the last 72 hours. Thyroid Function Tests: No results for input(s): TSH, T4TOTAL, FREET4, T3FREE, THYROIDAB in the last 72 hours. Anemia Panel: No results for input(s): VITAMINB12, FOLATE, FERRITIN, TIBC, IRON, RETICCTPCT in the last 72 hours. Sepsis Labs: Recent Labs  Lab 04/02/21 1801  LATICACIDVEN 1.1    Recent Results (from the past 240 hour(s))  Resp Panel by RT-PCR (Flu A&B, Covid) Nasopharyngeal Swab     Status: None   Collection Time: 03/25/21 12:14 PM   Specimen: Nasopharyngeal Swab; Nasopharyngeal(NP) swabs in vial transport medium  Result Value Ref Range Status   SARS Coronavirus 2 by RT PCR NEGATIVE NEGATIVE Final    Comment: (NOTE) SARS-CoV-2 target nucleic acids are NOT DETECTED.  The SARS-CoV-2 RNA is generally detectable in upper respiratory specimens during the acute phase of infection. The lowest concentration of SARS-CoV-2 viral copies this assay can detect is 138 copies/mL. A negative result does not preclude SARS-Cov-2 infection and should not be used as the sole basis for treatment or other patient management decisions. A negative result may occur with  improper specimen collection/handling, submission of specimen other than nasopharyngeal swab, presence of viral mutation(s) within the areas targeted by this assay, and inadequate number of viral copies(<138 copies/mL). A negative result must be combined with clinical observations, patient history, and epidemiological information. The expected result is Negative.  Fact Sheet for Patients:  EntrepreneurPulse.com.au  Fact Sheet for Healthcare Providers:  IncredibleEmployment.be  This test is no t yet approved or cleared by the [place] FDA and  has been authorized for detection and/or diagnosis of SARS-CoV-2 by FDA  under an Emergency Use Authorization (EUA). This EUA will remain  in effect (meaning this test can be used) for the duration of the COVID-19 declaration under Section 564(b)(1) of the Act, 21 U.S.C.section 360bbb-3(b)(1), unless the authorization is terminated  or revoked sooner.       Influenza A by PCR NEGATIVE NEGATIVE Final   Influenza B by PCR NEGATIVE NEGATIVE Final    Comment: (NOTE) The Xpert Xpress SARS-CoV-2/FLU/RSV plus assay is intended as an aid in the diagnosis of influenza from Nasopharyngeal swab specimens and should not be used as a sole basis for treatment. Nasal washings and aspirates are unacceptable for Xpert Xpress SARS-CoV-2/FLU/RSV testing.  Fact Sheet for Patients: EntrepreneurPulse.com.au  Fact Sheet for Healthcare Providers: IncredibleEmployment.be  This test is not yet approved or cleared by the [place] FDA and has been authorized for detection and/or  diagnosis of SARS-CoV-2 by FDA under an Emergency Use Authorization (EUA). This EUA will remain in effect (meaning this test can be used) for the duration of the COVID-19 declaration under Section 564(b)(1) of the Act, 21 U.S.C. section 360bbb-3(b)(1), unless the authorization is terminated or revoked.  Performed at [facility], [address] [place], [place] [address]   Culture, blood (routine x 2)     Status: None (Preliminary result)   Collection Time: 04/02/21  6:01 PM   Specimen: Left Antecubital; Blood  Result Value Ref Range Status   Specimen Description LEFT ANTECUBITAL  Final   Special Requests   Final    BOTTLES DRAWN AEROBIC AND ANAEROBIC Blood Culture results may not be optimal due to an excessive volume of blood received in culture bottles   Culture   Final    NO GROWTH 2 DAYS Performed at [facility], [address] [place], [place] [address]    Report Status PENDING  Incomplete  Culture, blood (routine x 2)      Status: None (Preliminary result)   Collection Time: 04/02/21  8:15 PM   Specimen: BLOOD  Result Value Ref Range Status   Specimen Description BLOOD BLOOD LEFT HAND  Final   Special Requests   Final    BOTTLES DRAWN AEROBIC AND ANAEROBIC Blood Culture adequate volume   Culture   Final    NO GROWTH 2 DAYS Performed at [facility], [address], [place], [place] [address]    Report Status PENDING  Incomplete  SARS CORONAVIRUS 2 (TAT 6-24 HRS) Nasopharyngeal Nasopharyngeal Swab     Status: None   Collection Time: 04/02/21  8:20 PM   Specimen: Nasopharyngeal Swab  Result Value Ref Range Status   SARS Coronavirus 2 NEGATIVE NEGATIVE Final    Comment: (NOTE) SARS-CoV-2 target nucleic acids are NOT DETECTED.  The SARS-CoV-2 RNA is generally detectable in upper and lower respiratory specimens during the acute phase of infection. Negative results do not preclude SARS-CoV-2 infection, do not rule out co-infections with other pathogens, and should not be used as the sole basis for treatment or other patient management decisions. Negative results must be combined with clinical observations, patient history, and epidemiological information. The expected result is Negative.  Fact Sheet for Patients: SugarRoll.be  Fact Sheet for Healthcare Providers: https://www.woods-mathews.com/  This test is not yet approved or cleared by the [place] FDA and  has been authorized for detection and/or diagnosis of SARS-CoV-2 by FDA under an Emergency Use Authorization (EUA). This EUA will remain  in effect (meaning this test can be used) for the duration of the COVID-19 declaration under Se ction 564(b)(1) of the Act, 21 U.S.C. section 360bbb-3(b)(1), unless the authorization is terminated or revoked sooner.  Performed at [facility], [place] [address] [place], [place] [address]          Radiology Studies: US Venous Img Lower  Unilateral Right  Result Date: 04/02/2021 CLINICAL DATA:  Right lower extremity edema for 1 week EXAM: RIGHT LOWER EXTREMITY VENOUS DOPPLER ULTRASOUND TECHNIQUE: Gray-scale sonography with compression, as well as color and duplex ultrasound, were performed to evaluate the deep venous system(s) from the level of the common femoral vein through the popliteal and proximal calf veins. COMPARISON:  None. FINDINGS: VENOUS Normal compressibility of the common femoral, superficial femoral, and popliteal veins, as well as the visualized calf veins. Visualized portions of profunda femoral vein and great saphenous vein unremarkable. No filling defects to suggest DVT  on grayscale or color Doppler imaging. Doppler waveforms show normal direction of venous flow, normal respiratory plasticity and response to augmentation. Limited views of the contralateral common femoral vein are unremarkable. OTHER None. Limitations: none IMPRESSION: 1. No evidence of deep venous thrombosis within the right lower extremity. Electronically Signed   By: Randa Ngo M.D.   On: 04/02/2021 21:05   DG Foot 2 Views Right  Result Date: 04/02/2021 CLINICAL DATA:  Right foot swelling greatest at first digit EXAM: RIGHT FOOT - 2 VIEW COMPARISON:  None. FINDINGS: Frontal and lateral views of the right foot are obtained. The bones are diffusely osteopenic. Prior healed second through fifth metatarsal fractures. No acute bony abnormalities. There is diffuse interphalangeal joint space narrowing, as well as prominent joint space narrowing and osteophyte formation of the first metatarsophalangeal joint, most consistent with osteoarthritis. Large inferior calcaneal spur. Ulceration dorsal aspect of the forefoot. Diffuse soft tissue swelling of the forefoot greatest at the first digit. IMPRESSION: 1. Forefoot soft tissue swelling and dorsal ulceration. 2. No acute or destructive bony lesions. 3. Multifocal osteoarthritis. 4. Osteopenia. Electronically  Signed   By: Randa Ngo M.D.   On: 04/02/2021 19:04        Scheduled Meds: . atorvastatin  80 mg Oral QHS  . carvedilol  12.5 mg Oral BID WC  . Chlorhexidine Gluconate Cloth  6 each Topical Q0600  . heparin injection (subcutaneous)  5,000 Units Subcutaneous Q12H  . insulin aspart  0-15 Units Subcutaneous TID PC & HS  . insulin glargine  10 Units Subcutaneous Daily  . lidocaine  1 patch Transdermal Daily  . pantoprazole  40 mg Oral BID AC  . sodium chloride flush  3 mL Intravenous Q12H   Continuous Infusions: . sodium chloride    . sodium chloride    . sodium chloride    . ceFEPime (MAXIPIME) IV 1 g (04/04/21 0510)  . vancomycin      Assessment & Plan:   Active Problems:   Cellulitis in diabetic foot ([place])   1.  Diabetic right foot moderate nonpurulent cellulitis secondary to infected ulcer in the setting of immunosuppression with diabetes mellitus. IDs input was appreciated. Cultures done on largest wound to rule out resistant organism Continue Vanco and cefepime Podiatry to see Vascular consulted Continue on ivabx with cefepime and vancomycin Since difficult to assess pt's peripheral pulses /pedal , foot cooler to touch, will consult vascular surgery, input appreciated-we will obtain ABIs initially He may require angiography with intervention for improvement in his circulation to allow for wound healing    2.  Type II diabetes mellitus with mild hyperglycemia. Patient refusing insulin Ate 2 bar of chocolate today Discussed glycemic control due to his wound infection for better wound healing We will DC his insulin and keep him on R-ISS pain  3.  End-stage renal disease on hemodialysis. On dialysis Dialysis today Nephrology following Next treatment will be Monday    4.  Dyslipidemia. Continue statins  5.  Essential hypertension. Resume Coreg and Norvasc   6.  GERD. -We will continue PPI therapy.  7.  Coronary artery disease. -We will  continue beta-blocker therapy, Imdur and aspirin as well as statin therapy.    DVT prophylaxis: heparin Code Status:full Family Communication: none at bedside  Status is: Inpatient  The patient remains inpatient due to severity of illness and requiring IV treatment.  Dispo: The patient is from:home              Anticipated  d/c is to: TBD/home              Patient currently not medically stable   Difficult to place patient:no            LOS: 2 days   Time spent: 35 min with >50% on coc    Nolberto Hanlon, MD [facility] Pager [phone]  If 7PM-7AM, please contact night-coverage 04/04/2021, 8:48 AM

## 2021-04-04 NOTE — Progress Notes (Signed)
[place] INFECTIOUS DISEASE PROGRESS NOTE Date of Admission:  04/02/2021     ID: Richard Zavala is a 60 y.o. male with ESRD, LE cellulitis Active Problems:   Cellulitis in diabetic foot ([place])   Subjective: No fevers.  Was seen by vascular surgery.  They feel it is highly likely he will require angiography.  ABIs have been ordered. Wound culture has rare gram-positive rods.  Blood cultures remain negative.  ROS  Eleven systems are reviewed and negative except per hpi  Medications:  Antibiotics Given (last 72 hours)     Date/Time Action Medication Dose Rate   04/03/21 0614 New Bag/Given   ceFEPIme (MAXIPIME) 1 g in sodium chloride 0.9 % 100 mL IVPB 1 g 200 mL/hr   04/03/21 0922 New Bag/Given   vancomycin (VANCOREADY) IVPB 1500 mg/300 mL 1,500 mg 150 mL/hr   04/04/21 0510 New Bag/Given   ceFEPIme (MAXIPIME) 1 g in sodium chloride 0.9 % 100 mL IVPB 1 g 200 mL/hr      . atorvastatin  80 mg Oral QHS  . carvedilol  12.5 mg Oral BID WC  . Chlorhexidine Gluconate Cloth  6 each Topical Q0600  . heparin injection (subcutaneous)  5,000 Units Subcutaneous Q12H  . insulin aspart  0-15 Units Subcutaneous TID PC & HS  . insulin glargine  10 Units Subcutaneous Daily  . lidocaine  1 patch Transdermal Daily  . pantoprazole  40 mg Oral BID AC  . sodium chloride flush  3 mL Intravenous Q12H    Objective: Vital signs in last 24 hours: Temp:  [97.6 F (36.4 C)-98.3 F (36.8 C)] 97.7 F (36.5 C) (04/15 1117) Pulse Rate:  [61-108] 64 (04/15 1117) Resp:  [16-20] 20 (04/15 1117) BP: (81-161)/(65-101) 136/94 (04/15 1117) SpO2:  [98 %-100 %] 100 % (04/15 1117) Constitutional: He is oriented to person, place, and time. He appears well-developed and well-nourished. No distress.  HENT:anicteric, L eye is cloudy Mouth/Throat: Oropharynx is clear and moist. No oropharyngeal exudate.  Cardiovascular: Normal rate, regular rhythm and normal heart sounds. Pulmonary/Chest: Effort normal and  breath sounds normal. No respiratory distress. He has no wheezes.  Abdominal: Soft. Bowel sounds are normal. He exhibits no distension. There is no tenderness.  Lymphadenopathy: He has no cervical adenopathy.  Neurological: He is alert and oriented to person, place, and time. Diminished sensation RLE Skin:see photo. Multiple wounds. Surrounding erythema. Psychiatric: He has a normal mood and affect. His behavior is normal.  Diminished pulses RLE, poor cap refill  Lab Results Recent Labs    04/02/21 1801 04/03/21 0543  WBC 6.6 5.9  HGB 9.4* 9.4*  HCT 29.5* 29.6*  NA 135 138  K 4.4 4.2  CL 99 101  CO2 23 25  BUN 39* 49*  CREATININE 4.82* 5.30*    Microbiology: @micro @ Studies/Results: US Venous Img Lower Unilateral Right  Result Date: 04/02/2021 CLINICAL DATA:  Right lower extremity edema for 1 week EXAM: RIGHT LOWER EXTREMITY VENOUS DOPPLER ULTRASOUND TECHNIQUE: Gray-scale sonography with compression, as well as color and duplex ultrasound, were performed to evaluate the deep venous system(s) from the level of the common femoral vein through the popliteal and proximal calf veins. COMPARISON:  None. FINDINGS: VENOUS Normal compressibility of the common femoral, superficial femoral, and popliteal veins, as well as the visualized calf veins. Visualized portions of profunda femoral vein and great saphenous vein unremarkable. No filling defects to suggest DVT on grayscale or color Doppler imaging. Doppler waveforms show normal direction of venous flow, normal  respiratory plasticity and response to augmentation. Limited views of the contralateral common femoral vein are unremarkable. OTHER None. Limitations: none IMPRESSION: 1. No evidence of deep venous thrombosis within the right lower extremity. Electronically Signed   By: Randa Ngo M.D.   On: 04/02/2021 21:05   DG Foot 2 Views Right  Result Date: 04/02/2021 CLINICAL DATA:  Right foot swelling greatest at first digit EXAM: RIGHT  FOOT - 2 VIEW COMPARISON:  None. FINDINGS: Frontal and lateral views of the right foot are obtained. The bones are diffusely osteopenic. Prior healed second through fifth metatarsal fractures. No acute bony abnormalities. There is diffuse interphalangeal joint space narrowing, as well as prominent joint space narrowing and osteophyte formation of the first metatarsophalangeal joint, most consistent with osteoarthritis. Large inferior calcaneal spur. Ulceration dorsal aspect of the forefoot. Diffuse soft tissue swelling of the forefoot greatest at the first digit. IMPRESSION: 1. Forefoot soft tissue swelling and dorsal ulceration. 2. No acute or destructive bony lesions. 3. Multifocal osteoarthritis. 4. Osteopenia. Electronically Signed   By: Randa Ngo M.D.   On: 04/02/2021 19:04    Assessment/Plan: Richard Zavala is a 60 y.o. male with ESRD, DM complicated by PN and multiple other medical issues (CHF, coronary artery disease, hypertension, dyslipidemia, peripheral neuropathy, CVA and BPH) now with nonhealing ulcers of RLE. On exam he has diminished DP and TP pulses, cool foot and poor cap refill. Suspect PAD with overlying cellulitis   Recommendations Cx pending Cont vanco cefepime.  Vascular work up proceeding  Thank you very much for the consult. Will follow with you.  Leonel Ramsay   04/04/2021, 1:06 PM

## 2021-04-04 NOTE — Care Management Important Message (Signed)
Important Message  Patient Details  Name: Richard Zavala MRN: [id number] Date of Birth: 04-Dec-1961   Medicare Important Message Given:  N/A - LOS <3 / Initial given by admissions  Initial Medicare IM reviewed with patient by S. Geanie Cooley, Patient Access Associate on 04/04/2021 at 9:05am.   Dannette Barbara 04/04/2021, 1:24 PM

## 2021-04-04 NOTE — Progress Notes (Signed)
[place], [place] 04/04/21  Subjective:   LOS: 2  Richard Zavala is a 60 y.o. male and established patient of [facility]. He has a past medical history of ESRD on hemodialysis, anemia, diabetes, CAD, CHF, pulmonary hypertension and osteoporosis. He presents to the ED with leg swelling found to be cellulitis.   Patient is seen sitting on side of bed Denies pain and discomfort Tolerating meals Denies shortness of breath    Objective:  Vital signs in last 24 hours:  Temp:  [97.6 F (36.4 C)-98.3 F (36.8 C)] 97.7 F (36.5 C) (04/15 1359) Pulse Rate:  [61-108] 63 (04/15 1400) Resp:  [16-20] 16 (04/15 1400) BP: (81-161)/(65-101) 108/77 (04/15 1400) SpO2:  [98 %-100 %] 100 % (04/15 1400)  Weight change:  Filed Weights   04/02/21 1755 04/03/21 0130  Weight: 71 kg 73.9 kg    Intake/Output:    Intake/Output Summary (Last 24 hours) at 04/04/2021 1425 Last data filed at 04/04/2021 0900 Gross per 24 hour  Intake 705.57 ml  Output --  Net 705.57 ml     Physical Exam: General: NAD, sitting at bedside  HEENT Normocephalic, moist oral mucosa  Pulm/lungs Clear sounds, regular breathing pattern  CVS/Heart S1S2 present, regular  Abdomen:  Soft, non tender, nondistended  Extremities: 1+ edema, left LE surgical dressing  Neurologic: drowsy, able to answer questions  Skin: No rashes or masses, RLE/foot redness  Access: Rt lower arm AVF       Basic Metabolic Panel:  Recent Labs  Lab 04/02/21 1801 04/03/21 0543  NA 135 138  K 4.4 4.2  CL 99 101  CO2 23 25  GLUCOSE 216* 55*  BUN 39* 49*  CREATININE 4.82* 5.30*  CALCIUM 8.3* 8.4*     CBC: Recent Labs  Lab 04/02/21 1801 04/03/21 0543  WBC 6.6 5.9  NEUTROABS 4.9  --   HGB 9.4* 9.4*  HCT 29.5* 29.6*  MCV 98.0 98.7  PLT 234 211      Lab Results  Component Value Date   HEPBSAG NON REACTIVE 03/21/2021   HEPBSAB Non Reactive 08/14/2015      Microbiology:  Recent Results (from the  past 240 hour(s))  Culture, blood (routine x 2)     Status: None (Preliminary result)   Collection Time: 04/02/21  6:01 PM   Specimen: Left Antecubital; Blood  Result Value Ref Range Status   Specimen Description LEFT ANTECUBITAL  Final   Special Requests   Final    BOTTLES DRAWN AEROBIC AND ANAEROBIC Blood Culture results may not be optimal due to an excessive volume of blood received in culture bottles   Culture   Final    NO GROWTH 2 DAYS Performed at [facility], [address] [place], [place] [address]    Report Status PENDING  Incomplete  Culture, blood (routine x 2)     Status: None (Preliminary result)   Collection Time: 04/02/21  8:15 PM   Specimen: BLOOD  Result Value Ref Range Status   Specimen Description BLOOD BLOOD LEFT HAND  Final   Special Requests   Final    BOTTLES DRAWN AEROBIC AND ANAEROBIC Blood Culture adequate volume   Culture   Final    NO GROWTH 2 DAYS Performed at [facility], [address] [place], [place] [address]    Report Status PENDING  Incomplete  SARS CORONAVIRUS 2 (TAT 6-24 HRS) Nasopharyngeal Nasopharyngeal Swab     Status: None   Collection Time: 04/02/21  8:20 PM   Specimen: Nasopharyngeal Swab  Result Value Ref Range Status   SARS Coronavirus 2 NEGATIVE NEGATIVE Final    Comment: (NOTE) SARS-CoV-2 target nucleic acids are NOT DETECTED.  The SARS-CoV-2 RNA is generally detectable in upper and lower respiratory specimens during the acute phase of infection. Negative results do not preclude SARS-CoV-2 infection, do not rule out co-infections with other pathogens, and should not be used as the sole basis for treatment or other patient management decisions. Negative results must be combined with clinical observations, patient history, and epidemiological information. The expected result is Negative.  Fact Sheet for Patients: SugarRoll.be  Fact Sheet for Healthcare  Providers: https://www.woods-mathews.com/  This test is not yet approved or cleared by the [place] FDA and  has been authorized for detection and/or diagnosis of SARS-CoV-2 by FDA under an Emergency Use Authorization (EUA). This EUA will remain  in effect (meaning this test can be used) for the duration of the COVID-19 declaration under Se ction 564(b)(1) of the Act, 21 U.S.C. section 360bbb-3(b)(1), unless the authorization is terminated or revoked sooner.  Performed at [facility], [place] [address], [place], [place] [address]   Aerobic Culture w Gram Stain (superficial specimen)     Status: None (Preliminary result)   Collection Time: 04/03/21  2:34 PM   Specimen: Wound  Result Value Ref Range Status   Specimen Description   Final    WOUND Performed at [facility], [address], [place], [place] [address]    Special Requests   Final    LEFT FOOT Performed at [facility], [address] [place], [place] [address]    Gram Stain   Final    RARE WBC PRESENT,BOTH PMN AND MONONUCLEAR RARE GRAM POSITIVE RODS Performed at [facility], [place] [address] [place],  [address]    Culture PENDING  Incomplete   Report Status PENDING  Incomplete    Coagulation Studies: Recent Labs    04/03/21 0543  LABPROT 17.0*  INR 1.4*    Urinalysis: No results for input(s): COLORURINE, LABSPEC, PHURINE, GLUCOSEU, HGBUR, BILIRUBINUR, KETONESUR, PROTEINUR, UROBILINOGEN, NITRITE, LEUKOCYTESUR in the last 72 hours.  Invalid input(s): APPERANCEUR    Imaging: US Venous Img Lower Unilateral Right  Result Date: 04/02/2021 CLINICAL DATA:  Right lower extremity edema for 1 week EXAM: RIGHT LOWER EXTREMITY VENOUS DOPPLER ULTRASOUND TECHNIQUE: Gray-scale sonography with compression, as well as color and duplex ultrasound, were performed to evaluate the deep venous system(s) from the level of the common femoral vein through the popliteal and  proximal calf veins. COMPARISON:  None. FINDINGS: VENOUS Normal compressibility of the common femoral, superficial femoral, and popliteal veins, as well as the visualized calf veins. Visualized portions of profunda femoral vein and great saphenous vein unremarkable. No filling defects to suggest DVT on grayscale or color Doppler imaging. Doppler waveforms show normal direction of venous flow, normal respiratory plasticity and response to augmentation. Limited views of the contralateral common femoral vein are unremarkable. OTHER None. Limitations: none IMPRESSION: 1. No evidence of deep venous thrombosis within the right lower extremity. Electronically Signed   By: Randa Ngo M.D.   On: 04/02/2021 21:05   DG Foot 2 Views Right  Result Date: 04/02/2021 CLINICAL DATA:  Right foot swelling greatest at first digit EXAM: RIGHT FOOT - 2 VIEW COMPARISON:  None. FINDINGS: Frontal and lateral views of the right foot are obtained. The bones are diffusely osteopenic. Prior healed second through fifth metatarsal fractures.  No acute bony abnormalities. There is diffuse interphalangeal joint space narrowing, as well as prominent joint space narrowing and osteophyte formation of the first metatarsophalangeal joint, most consistent with osteoarthritis. Large inferior calcaneal spur. Ulceration dorsal aspect of the forefoot. Diffuse soft tissue swelling of the forefoot greatest at the first digit. IMPRESSION: 1. Forefoot soft tissue swelling and dorsal ulceration. 2. No acute or destructive bony lesions. 3. Multifocal osteoarthritis. 4. Osteopenia. Electronically Signed   By: Randa Ngo M.D.   On: 04/02/2021 19:04     Medications:   . sodium chloride    . sodium chloride    . sodium chloride    . ceFEPime (MAXIPIME) IV 1 g (04/04/21 0510)  . vancomycin     . atorvastatin  80 mg Oral QHS  . carvedilol  12.5 mg Oral BID WC  . Chlorhexidine Gluconate Cloth  6 each Topical Q0600  . heparin injection  (subcutaneous)  5,000 Units Subcutaneous Q12H  . insulin aspart  0-15 Units Subcutaneous TID PC & HS  . insulin glargine  10 Units Subcutaneous Daily  . lidocaine  1 patch Transdermal Daily  . pantoprazole  40 mg Oral BID AC  . sodium chloride flush  3 mL Intravenous Q12H   sodium chloride, sodium chloride, sodium chloride, acetaminophen **OR** acetaminophen, albuterol, alteplase, heparin, lidocaine (PF), lidocaine-prilocaine, magnesium hydroxide, morphine injection, ondansetron **OR** ondansetron (ZOFRAN) IV, oxyCODONE, pentafluoroprop-tetrafluoroeth, sodium chloride flush, traZODone  Assessment/ Plan:  60 y.o. male with  was admitted on 04/02/2021 for  Active Problems:   Cellulitis in diabetic foot ([place])  ESRD (end stage renal disease) ([place]) [N18.6] Cellulitis in diabetic foot ([place]) [E11.628, U72.536]  [facility]/MWF/ Rt lower AVF/ 72kg  #. ESRD on HD - Will maintain outpatient schedule - Scheduled for dialysis today - UF goal 2L, but patient request higher UF. Will challenge - Next treatment will be Monday  #. Anemia of CKD  Lab Results  Component Value Date   HGB 9.4 (L) 04/03/2021  EPO given outpatient Will consider during dialysis  #. Secondary hyperparathyroidism of renal origin N 25.81   No results found for: PTH Lab Results  Component Value Date   PHOS 6.0 (H) 03/24/2021  Begin Calcium Acetate with meals Not at goal Monitor calcium and phos level during this admission   #. Diabetes type 2 with CKD Hemoglobin A1C (%)  Date Value  10/28/2012 8.3 (H)   Hgb A1c MFr Bld (%)  Date Value  03/21/2021 7.8 (H)  Hypoglycemic event to 57 last night.  Primary team managing SSI    LOS: 2 Colon [place] 4/15/20222:25 PM  [facility] [place], [place]

## 2021-04-04 NOTE — Consult Note (Signed)
Pharmacy Antibiotic Note  Richard Zavala is a 60 y.o. male admitted on 04/02/2021 with right foot cellulitis with past medical history of ESRD on hemodialysis. Patient had an ORIF of the left leg during a recent admission and was discharged 03/25/2021. He now endorses swelling and redness of his right foot for the last 4 days. There are also several ulcerations over his right foot and leg. Temperature was 100 F (now afebrile) and WBC was 6.6 at admission. Right lower extremity ultrasound was negative for DVT & X-ray of foot negative for osteomyelitis.  Pharmacy has been consulted for vancomycin and cefepime dosing.  Patient received vancomycin 1500 mg IV x 1 loading dose on 4/14  Patient will receive dialysis today.  Plan: Vancomycin 750 mg IV post-HD sessions (MWF) Check vancomycin level around 3rd HD session  Continue cefepime 1 g IV q24h    Height: 5\' 8"  (172.7 cm) Weight: 73.9 kg (162 lb 14.7 oz) IBW/kg (Calculated) : 68.4  Temp (24hrs), Avg:97.9 F (36.6 C), Min:97.6 F (36.4 C), Max:98.3 F (36.8 C)  Recent Labs  Lab 04/02/21 1801 04/03/21 0543  WBC 6.6 5.9  CREATININE 4.82* 5.30*  LATICACIDVEN 1.1  --     Estimated Creatinine Clearance: 14.7 mL/min (A) (by C-G formula based on SCr of 5.3 mg/dL (H)).    Allergies  Allergen Reactions  . Metformin Other (See Comments)    Severe diarrhea   . Penicillins Hives and Other (See Comments)    Has patient had a PCN reaction causing immediate rash, facial/tongue/throat swelling, SOB or lightheadedness with hypotension:Yes Has patient had a PCN reaction causing severe rash involving mucus membranes or skin necrosis:Yes Has patient had a PCN reaction that required hospitalization:inpatient at the time of reaction. Has patient had a PCN reaction occurring within the last 10 years:No If all of the above answers are "NO", then may proceed with Cephalosporin use.     Antimicrobials this admission: Cefepime 4/13 >>  Vancomycin 4/13  >>   Dose adjustments this admission:  Microbiology results: 4/13 BCx: no growth, 2 days 4/14 Wound culture: gram stain- rare WBC, rare gram positive rods  Thank you for allowing pharmacy to be a part of this patient's care.  Beckey Rutter PharmD Candidate, Class of 2022 [employer]

## 2021-04-04 NOTE — Progress Notes (Signed)
Inpatient Diabetes Program Recommendations  AACE/ADA: New Consensus Statement on Inpatient Glycemic Control (2015)  Target Ranges:  Prepandial:   less than 140 mg/dL      Peak postprandial:   less than 180 mg/dL (1-2 hours)      Critically ill patients:  140 - 180 mg/dL   Lab Results  Component Value Date   GLUCAP 269 (H) 04/04/2021   HGBA1C 7.8 (H) 03/21/2021    Review of Glycemic Control Results for Richard Zavala, BOCHICCHIO" (MRN [id number]) as of 04/04/2021 10:53  Ref. Range 04/03/2021 12:32 04/03/2021 16:02 04/03/2021 20:51 04/03/2021 21:37 04/04/2021 07:40  Glucose-Capillary Latest Ref Range: 70 - 99 mg/dL 186 (H) 214 (H) 57 (L) 84 269 (H)  Diabetes history: DM 2 Outpatient Diabetes medications:  Lantus 9-10 units daily Current orders for Inpatient glycemic control:  Novolog moderate tid with meals and HS Lantus 10 units daily Inpatient Diabetes Program Recommendations:    Please consider reducing Novolog correction to sensitive tid with meals and use HS scale starting at 201 mg/dL.   Thanks,  Adah Perl, RN, BC-ADM Inpatient Diabetes Coordinator Pager [phone] (8a-5p)

## 2021-04-05 DIAGNOSIS — E0865 Diabetes mellitus due to underlying condition with hyperglycemia: Secondary | ICD-10-CM

## 2021-04-05 DIAGNOSIS — I2510 Atherosclerotic heart disease of native coronary artery without angina pectoris: Secondary | ICD-10-CM

## 2021-04-05 DIAGNOSIS — Z794 Long term (current) use of insulin: Secondary | ICD-10-CM

## 2021-04-05 LAB — GLUCOSE, CAPILLARY
Glucose-Capillary: 179 mg/dL — ABNORMAL HIGH (ref 70–99)
Glucose-Capillary: 152 mg/dL — ABNORMAL HIGH (ref 70–99)
Glucose-Capillary: 233 mg/dL — ABNORMAL HIGH (ref 70–99)
Glucose-Capillary: 205 mg/dL — ABNORMAL HIGH (ref 70–99)

## 2021-04-05 MED ORDER — DOCUSATE SODIUM 100 MG PO CAPS
100.0000 mg | ORAL_CAPSULE | Freq: Two times a day (BID) | ORAL | Status: DC | PRN
  Administered 2021-04-05 – 2021-04-10 (×5): 100 mg via ORAL
  Filled 2021-04-05 (×5): qty 1

## 2021-04-05 MED ORDER — FUROSEMIDE 40 MG PO TABS
80.0000 mg | ORAL_TABLET | Freq: Once | ORAL | Status: AC
  Administered 2021-04-05: 80 mg via ORAL
  Filled 2021-04-05: qty 2

## 2021-04-05 MED ORDER — FUROSEMIDE 40 MG PO TABS: 80.0000 mg | ORAL_TABLET | ORAL | Status: DC

## 2021-04-05 MED ORDER — FUROSEMIDE 40 MG PO TABS
80.0000 mg | ORAL_TABLET | ORAL | Status: DC
  Administered 2021-04-06 – 2021-04-10 (×3): 80 mg via ORAL
  Filled 2021-04-05 (×3): qty 2

## 2021-04-05 NOTE — Progress Notes (Signed)
Received IV consult to start PIV. Patient has active fistulas in each arm. Received nursing care order from [facility] ok to stick left hand for PIV.

## 2021-04-05 NOTE — Progress Notes (Signed)
Pt states his IV came out of his hand when he was using the bathroom. Pt has limited access due to fistulas in both arms. Pt has order in for OK to stick L hand via IV team. IV consult reordered at this time.

## 2021-04-05 NOTE — Consult Note (Addendum)
Reason for Consult: Right foot ulcer Referring Physician: Dr. Nolberto Hanlon, MD  Richard Zavala is an 60 y.o. male.  HPI: 81-year-old male with past medical history significant for multiple medical issues including CHF, coronary artery disease, end-stage renal disease on dialysis, hypertension, dyslipidemia, peripheral neuropathy, CVA and BPH who presented to emergency room with acute onset worsening right foot swelling and redness.  He has been under the care of the wound care center, Dr. Dellia Nims, for the wound.  Patient states that he feels the wound is getting better.  Vascular surgery has been consulted as well as infectious disease.  Awaiting updated arterial studies.   Past Medical History:  Diagnosis Date  . Anemia   . Blind   . BPH (benign prostatic hyperplasia)   . CAD (coronary artery disease)   . CHF (congestive heart failure) ([place])   . Chronic kidney disease (CKD), stage IV (severe) (H[facility])    DIALYSIS  . Diabetes mellitus without complication ([place])   . ESRD (end stage renal disease) ([place])    Monday-Wednesday-Friday dialysis  . Hyperlipemia   . Hypertension   . Neuropathy   . Osteoporosis   . Pulmonary HTN ([place])   . Stroke [facility])    2013  . TIA (transient ischemic attack)   . TRD (traction retinal detachment)   . Wilms' tumor [facility])     Past Surgical History:  Procedure Laterality Date  . A/V FISTULAGRAM Left 01/28/2017   Procedure: A/V Fistulagram;  Surgeon: Algernon Huxley, MD;  Location: [facility];  Service: Cardiovascular;  Laterality: Left;  . A/V FISTULAGRAM Left 04/28/2018   Procedure: A/V FISTULAGRAM;  Surgeon: Algernon Huxley, MD;  Location: [facility];  Service: Cardiovascular;  Laterality: Left;  . A/V FISTULAGRAM N/A 03/21/2020   Procedure: A/V FISTULAGRAM;  Surgeon: Algernon Huxley, MD;  Location: [facility];  Service: Cardiovascular;  Laterality: N/A;  . AV FISTULA PLACEMENT    . AV FISTULA PLACEMENT Right 11/23/2019   Procedure:  ARTERIOVENOUS (AV) FISTULA CREATION (RADIOCEPHALIC );  Surgeon: Algernon Huxley, MD;  Location: [facility];  Service: Vascular;  Laterality: Right;  . CARDIAC CATHETERIZATION    . CATARACT EXTRACTION W/PHACO Right 05/14/2016   Procedure: CATARACT EXTRACTION PHACO AND INTRAOCULAR LENS PLACEMENT (IOC);  Surgeon: Eulogio Bear, MD;  Location: [facility];  Service: Ophthalmology;  Laterality: Right;  [place] 1.46 AP% 11.5 CDE 12.33 FLUID PACK LOT # [id number] H  . EYE SURGERY    . FRACTURE SURGERY     RIGHT LEG WITH ROD  . HIP SURGERY    . NEPHRECTOMY RADICAL    . PERIPHERAL VASCULAR CATHETERIZATION N/A 08/28/2015   Procedure: A/V Shuntogram/Fistulagram;  Surgeon: Algernon Huxley, MD;  Location: [facility];  Service: Cardiovascular;  Laterality: N/A;  . PERIPHERAL VASCULAR CATHETERIZATION Left 08/28/2015   Procedure: A/V Shunt Intervention;  Surgeon: Algernon Huxley, MD;  Location: [facility];  Service: Cardiovascular;  Laterality: Left;  . TIBIA IM NAIL INSERTION Left 03/21/2021   Procedure: INTRAMEDULLARY (IM) NAIL TIBIAL;  Surgeon: Thornton Park, MD;  Location: [facility];  Service: Orthopedics;  Laterality: Left;  . UPPER EXTREMITY ANGIOGRAPHY Right 08/01/2020   Procedure: UPPER EXTREMITY ANGIOGRAPHY;  Surgeon: Algernon Huxley, MD;  Location: [facility] [facility];  Service: Cardiovascular;  Laterality: Right;    Family History  Problem Relation Age of Onset  . CAD Father   . COPD Father   . CAD Paternal Grandfather   .  Diabetes Maternal Aunt   . Diabetes Maternal Uncle     Social History:  reports that he quit smoking about 8 years ago. His smoking use included cigarettes. He smoked 1.00 pack per day. He has never used smokeless tobacco. He reports that he does not drink alcohol and does not use drugs.  Allergies:  Allergies  Allergen Reactions  . Metformin Other (See Comments)    Severe diarrhea   . Penicillins Hives and Other (See Comments)    Has patient had a PCN reaction causing  immediate rash, facial/tongue/throat swelling, SOB or lightheadedness with hypotension:Yes Has patient had a PCN reaction causing severe rash involving mucus membranes or skin necrosis:Yes Has patient had a PCN reaction that required hospitalization:inpatient at the time of reaction. Has patient had a PCN reaction occurring within the last 10 years:No If all of the above answers are "NO", then may proceed with Cephalosporin use.     Results for orders placed or performed during the hospital encounter of 04/02/21 (from the past 48 hour(s))  Aerobic Culture w Gram Stain (superficial specimen)     Status: None (Preliminary result)   Collection Time: 04/03/21  2:34 PM   Specimen: Wound  Result Value Ref Range   Specimen Description      WOUND Performed at [facility], [address] [place], [place] [address]    Special Requests      LEFT FOOT Performed at [facility], [address] [place], [place] [address]    Gram Stain      RARE WBC PRESENT,BOTH PMN AND MONONUCLEAR RARE GRAM POSITIVE RODS    Culture      CULTURE REINCUBATED FOR BETTER GROWTH Performed at [facility], [place] [address] [place], [place] [address]    Report Status PENDING   Glucose, capillary     Status: Abnormal   Collection Time: 04/03/21  4:02 PM  Result Value Ref Range   Glucose-Capillary 214 (H) 70 - 99 mg/dL    Comment: Glucose reference range applies only to samples taken after fasting for at least 8 hours.  Glucose, capillary     Status: Abnormal   Collection Time: 04/03/21  8:51 PM  Result Value Ref Range   Glucose-Capillary 57 (L) 70 - 99 mg/dL    Comment: Glucose reference range applies only to samples taken after fasting for at least 8 hours.  Glucose, capillary     Status: None   Collection Time: 04/03/21  9:37 PM  Result Value Ref Range   Glucose-Capillary 84 70 - 99 mg/dL    Comment: Glucose reference range applies only to samples taken after fasting for at least 8  hours.  Glucose, capillary     Status: Abnormal   Collection Time: 04/04/21  7:40 AM  Result Value Ref Range   Glucose-Capillary 269 (H) 70 - 99 mg/dL    Comment: Glucose reference range applies only to samples taken after fasting for at least 8 hours.   Comment 1 Notify RN   Glucose, capillary     Status: None   Collection Time: 04/04/21 12:10 PM  Result Value Ref Range   Glucose-Capillary 91 70 - 99 mg/dL    Comment: Glucose reference range applies only to samples taken after fasting for at least 8 hours.  Glucose, capillary     Status: Abnormal   Collection Time: 04/04/21  6:02 PM  Result Value Ref Range   Glucose-Capillary 115 (H) 70 - 99 mg/dL  Comment: Glucose reference range applies only to samples taken after fasting for at least 8 hours.  Glucose, capillary     Status: Abnormal   Collection Time: 04/04/21 11:00 PM  Result Value Ref Range   Glucose-Capillary 223 (H) 70 - 99 mg/dL    Comment: Glucose reference range applies only to samples taken after fasting for at least 8 hours.  Glucose, capillary     Status: Abnormal   Collection Time: 04/05/21  8:02 AM  Result Value Ref Range   Glucose-Capillary 179 (H) 70 - 99 mg/dL    Comment: Glucose reference range applies only to samples taken after fasting for at least 8 hours.    No results found.  Review of Systems Blood pressure (!) 132/106, pulse 71, temperature 98.2 F (36.8 C), temperature source Oral, resp. rate 18, height 5\' 8"  (1.727 m), weight 73.9 kg, SpO2 100 %. Physical Exam General: AAO x3, NAD  Dermatological: Chronic appearing ulceration in the dorsal aspect of the right foot just proximal to fourth, fifth metatarsal phalangeal joint.  Wound measures approximately 1.2 x 1.2 x 0.5 cm.  There is no probing to bone, undermining or tunneling.  Fibrotic tissue was evident.  Minimal amount of purulence was identified.  There is no areas of fluctuation, crepitation.  There is no pain to the wound.  Compared to the  pictures the cellulitis is much improved.  Superficial scabs present on the medial aspect of the foot just proximal to the first metatarsal phalangeal joint.  Splint intact left lower extremity and so did not evaluate the side.       Vascular: Dorsalis Pedis artery and Posterior Tibial artery pedal pulses are decreased on the right side.  Unable to fully evaluate the left side due to splint intact.   Neruologic: Sensation decreased  Musculoskeletal: No pain  Assessment/Plan: Chronic appearing ulceration right foot with improving cellulitis  X-rays negative on the right foot as far as osteomyelitis.  At this point I recommend continue IV antibiotics and monitor the wound.  Consider OR debridement however await arterial studies and vascular input prior to surgery if needed for debridement. Podiatry will continue to follow.   Trula Slade 04/05/2021, 12:52 PM

## 2021-04-05 NOTE — Progress Notes (Signed)
Per MD OK to leave PIV due to patient removing it so many times and limited access.

## 2021-04-05 NOTE — Progress Notes (Signed)
[facility]  ROUNDING NOTE   Subjective:   Patient reports that he feels like he has fluid in his abdomen and that he typically takes lasix 80mg  on his non dialysis days. He feels that lasix would help his stomach feel better. Reports he is eating well. Denies any shortness of breath. Reports he didn't sleep well last night due to the inability to get comfortable.   Patient requested higher UF goal yesterday. Reports he tolerated it well.    Objective:  Vital signs in last 24 hours:  Temp:  [97.7 F (36.5 C)-98.2 F (36.8 C)] 98.2 F (36.8 C) (04/16 0838) Pulse Rate:  [62-72] 71 (04/16 0838) Resp:  [15-21] 18 (04/16 0838) BP: (86-145)/(60-107) 132/106 (04/16 0838) SpO2:  [94 %-100 %] 100 % (04/16 0838)  Weight change:  Filed Weights   04/02/21 1755 04/03/21 0130  Weight: 71 kg 73.9 kg    Intake/Output: I/O last 3 completed shifts: In: 491.1 [P.O.:480; IV Piggyback:11.1] Out: 3050 [Urine:50; Other:3000]   Intake/Output this shift:  Total I/O In: 240 [P.O.:240] Out: -   Physical Exam: General: NAD,   Head: Normocephalic, atraumatic. Moist oral mucosal membranes  Eyes: Anicteric, PERRL  Neck: Supple, trachea midline  Lungs:  Clear to auscultation  Heart: Regular rate and rhythm  Abdomen:  Soft, nontender, non distended.   Extremities:  trace peripheral edema in right, left leg wrapped in surgical dressing.   Neurologic: Nonfocal, moving all four extremities  Skin: No lesions  Access: Right UE AVF     Basic Metabolic Panel: Recent Labs  Lab 04/02/21 1801 04/03/21 0543  NA 135 138  K 4.4 4.2  CL 99 101  CO2 23 25  GLUCOSE 216* 55*  BUN 39* 49*  CREATININE 4.82* 5.30*  CALCIUM 8.3* 8.4*    Liver Function Tests: Recent Labs  Lab 04/02/21 1801  AST 19  ALT 16  ALKPHOS 119  BILITOT 0.8  PROT 6.3*  ALBUMIN 2.6*   No results for input(s): LIPASE, AMYLASE in the last 168 hours. No results for input(s): AMMONIA in the last 168  hours.  CBC: Recent Labs  Lab 04/02/21 1801 04/03/21 0543  WBC 6.6 5.9  NEUTROABS 4.9  --   HGB 9.4* 9.4*  HCT 29.5* 29.6*  MCV 98.0 98.7  PLT 234 211    Cardiac Enzymes: No results for input(s): CKTOTAL, CKMB, CKMBINDEX, TROPONINI in the last 168 hours.  BNP: Invalid input(s): POCBNP  CBG: Recent Labs  Lab 04/04/21 0740 04/04/21 1210 04/04/21 1802 04/04/21 2300 04/05/21 0802  GLUCAP 269* 91 115* 223* 179*    Microbiology: Results for orders placed or performed during the hospital encounter of 04/02/21  Culture, blood (routine x 2)     Status: None (Preliminary result)   Collection Time: 04/02/21  6:01 PM   Specimen: Left Antecubital; Blood  Result Value Ref Range Status   Specimen Description LEFT ANTECUBITAL  Final   Special Requests   Final    BOTTLES DRAWN AEROBIC AND ANAEROBIC Blood Culture results may not be optimal due to an excessive volume of blood received in culture bottles   Culture   Final    NO GROWTH 3 DAYS Performed at [facility] [address] [place], [place] [address]    Report Status PENDING  Incomplete  Culture, blood (routine x 2)     Status: None (Preliminary result)   Collection Time: 04/02/21  8:15 PM   Specimen: BLOOD  Result Value Ref Range Status  Specimen Description BLOOD BLOOD LEFT HAND  Final   Special Requests   Final    BOTTLES DRAWN AEROBIC AND ANAEROBIC Blood Culture adequate volume   Culture   Final    NO GROWTH 3 DAYS Performed at [facility], [place], [place] [address]    Report Status PENDING  Incomplete  SARS CORONAVIRUS 2 (TAT 6-24 HRS) Nasopharyngeal Nasopharyngeal Swab     Status: None   Collection Time: 04/02/21  8:20 PM   Specimen: Nasopharyngeal Swab  Result Value Ref Range Status   SARS Coronavirus 2 NEGATIVE NEGATIVE Final    Comment: (NOTE) SARS-CoV-2 target nucleic acids are NOT DETECTED.  The SARS-CoV-2 RNA is generally detectable in upper and  lower respiratory specimens during the acute phase of infection. Negative results do not preclude SARS-CoV-2 infection, do not rule out co-infections with other pathogens, and should not be used as the sole basis for treatment or other patient management decisions. Negative results must be combined with clinical observations, patient history, and epidemiological information. The expected result is Negative.  Fact Sheet for Patients: SugarRoll.be  Fact Sheet for Healthcare Providers: https://www.woods-mathews.com/  This test is not yet approved or cleared by the [place] FDA and  has been authorized for detection and/or diagnosis of SARS-CoV-2 by FDA under an Emergency Use Authorization (EUA). This EUA will remain  in effect (meaning this test can be used) for the duration of the COVID-19 declaration under Se ction 564(b)(1) of the Act, 21 U.S.C. section 360bbb-3(b)(1), unless the authorization is terminated or revoked sooner.  Performed at [facility], [place] [address], [place], [place] [address]   Aerobic Culture w Gram Stain (superficial specimen)     Status: None (Preliminary result)   Collection Time: 04/03/21  2:34 PM   Specimen: Wound  Result Value Ref Range Status   Specimen Description   Final    WOUND Performed at [facility], [address] [place], [place] [address]    Special Requests   Final    LEFT FOOT Performed at [facility], [address] [place], [place] [address]    Gram Stain   Final    RARE WBC PRESENT,BOTH PMN AND MONONUCLEAR RARE GRAM POSITIVE RODS    Culture   Final    CULTURE REINCUBATED FOR BETTER GROWTH Performed at [facility], [place] [address] [place], [place] [address]    Report Status PENDING  Incomplete    Coagulation Studies: Recent Labs    04/03/21 0543  LABPROT 17.0*  INR 1.4*    Urinalysis: No results for input(s): COLORURINE, LABSPEC,  PHURINE, GLUCOSEU, HGBUR, BILIRUBINUR, KETONESUR, PROTEINUR, UROBILINOGEN, NITRITE, LEUKOCYTESUR in the last 72 hours.  Invalid input(s): APPERANCEUR    Imaging: No results found.   Medications:   . sodium chloride    . ceFEPime (MAXIPIME) IV Stopped (04/04/21 0546)  . vancomycin Stopped (04/04/21 1804)   . atorvastatin  80 mg Oral QHS  . carvedilol  12.5 mg Oral BID WC  . Chlorhexidine Gluconate Cloth  6 each Topical Q0600  . furosemide  80 mg Oral Q T,Th,S,Su-1800  . heparin injection (subcutaneous)  5,000 Units Subcutaneous Q12H  . insulin aspart  0-15 Units Subcutaneous TID PC & HS  . lidocaine  1 patch Transdermal Daily  . pantoprazole  40 mg Oral BID AC  . sodium chloride flush  3 mL Intravenous Q12H   sodium chloride, acetaminophen **OR** acetaminophen, albuterol, docusate sodium, magnesium hydroxide, morphine  injection, ondansetron **OR** ondansetron (ZOFRAN) IV, oxyCODONE, sodium chloride flush, traZODone  Assessment/ Plan:  [name] is a 60 y.o.  male with past medical history of ESRD on hemodialysis, anemia, diabetes, CAD, CHF, pulmonary hypertension and osteoporosis. He presents to the ED with leg swelling found to be cellulitis.   [facility]/MWF/ Rt lower AVF/ 72kg  1. ESRD on HD - Will maintain outpatient schedule - HD yesterday: Net UF of 3000 mL - Next treatment will be Monday, no indication for treatment today  - will order lasix 80 mg qd on his non dialysis days.   2. Anemia of CKD  - EPO given as outpatient  - hgb 9.4   3. Secondary hyperparathyroidism of renal origin  - may need to start a phosphorus binder.  - phos elevated to 6 on April 4.   4. Diabetes type 2 with CKD  - primary team managing       LOS: [place] 4/16/202212:06 PM

## 2021-04-05 NOTE — Progress Notes (Addendum)
Patient in bed noted with IV bleeding and hanging from left hand. As per report IV team restarted patient's IV prior to shift change. IV was intact for bedside shift report. Called out for nurse, saying that he is bleeding. This nurse went in and patient's IV was hanging from wrist bleeding at site. Bleeding controlled. Gauze dressing applied. When patient was asked what happened, he says he just looked up and saw blood and tried to stop it.  Will leave IV out for now, has ordered IV antibiotics noted to be given at 0600. Will reassess IV restart closer to this time for limited access and patient continues to remove IVs.

## 2021-04-05 NOTE — Progress Notes (Signed)
PROGRESS NOTE    Richard Zavala  KWI:[id number] DOB: 06/24/1961 DOA: 04/02/2021 PCP: Midge Minium, PA    Brief Narrative:  Richard Zavala is a 60 y.o. Caucasian male with medical history significant for multiple medical problems that are mentioned below including CHF, coronary artery disease, end-stage renal disease on hemodialysis, hypertension, dyslipidemia, peripheral neuropathy, CVA and BPH, who presented to the ER with acute onset of worsening right foot swelling with erythema and mild extension to the right leg for last 4 days.  The patient had ORIF of the left leg during recent admission here and was discharged on 03/25/2021.  He has a right foot ulcer and scattered healed ulcers over his foot and leg.  He denied any fever or chills.  He has no problems currently from his left leg.  Earlier today he finished his session of hemodialysis that he gets on Monday, Wednesday and Fridays.  No chest pain or palpitations.  No reported cough or wheezing or dyspnea.  ED Course: Upon presentation to the ER, temperature was 100 with a blood pressure 137/99 with otherwise normal vital signs.  Labs revealed blood glucose of 216 and a BUN of 39 with creatinine 4.82 with anion gap of 13.  Albumin was 2.6.  Lactic acid was 1.1.  CBC showed anemia close to his baseline.  Blood culture was drawn.  4/14- no overnight issues. Saw pt this am. In the after noon nsg stated pts bp 81/68, repeat with manual 153/71.  Was planning to give bolus and midodrine, but d/c'd since repeat with manual was stable.  4/15-found with two chocolate bars son brought to him. Refused insulin. Discussed good glycemic control, but appears not to care what I am trying to discuss about it  4/16-sunkist regular at bedside. Pt c/o why HD did not take a lot of fluid from him, he wanted more fluid taken out but they refused. No other issues  Consultants:   ID, vascular surgery                                     Procedures:  Antimicrobials:      Subjective: No pain, sob, cp  Objective: Vitals:   04/04/21 1730 04/04/21 1745 04/04/21 1812 04/04/21 2036  BP: (!) 86/70  105/81 115/64  Pulse: 65 65 69 72  Resp: 16 16 16 19   Temp:   98.2 F (36.8 C) 97.7 F (36.5 C)  TempSrc:   Oral Oral  SpO2: 100%  94% 98%  Weight:      Height:        Intake/Output Summary (Last 24 hours) at 04/05/2021 0824 Last data filed at 04/04/2021 2042 Gross per 24 hour  Intake 489.19 ml  Output 3050 ml  Net -2560.81 ml   Filed Weights   04/02/21 1755 04/03/21 0130  Weight: 71 kg 73.9 kg    Examination: nad cta no w/r/r Regular s1/s2 no gallop Soft benign +bs Mild edema RLE, erythema of top of feet, LLE wrapped Aaoxox3, grossly intact Mood and affect appropriate in current setting   Data Reviewed: I have personally reviewed following labs and imaging studies  CBC: Recent Labs  Lab 04/02/21 1801 04/03/21 0543  WBC 6.6 5.9  NEUTROABS 4.9  --   HGB 9.4* 9.4*  HCT 29.5* 29.6*  MCV 98.0 98.7  PLT 234 242   Basic Metabolic Panel: Recent Labs  Lab 04/02/21 1801 04/03/21  0543  NA 135 138  K 4.4 4.2  CL 99 101  CO2 23 25  GLUCOSE 216* 55*  BUN 39* 49*  CREATININE 4.82* 5.30*  CALCIUM 8.3* 8.4*   GFR: Estimated Creatinine Clearance: 14.7 mL/min (A) (by C-G formula based on SCr of 5.3 mg/dL (H)). Liver Function Tests: Recent Labs  Lab 04/02/21 1801  AST 19  ALT 16  ALKPHOS 119  BILITOT 0.8  PROT 6.3*  ALBUMIN 2.6*   No results for input(s): LIPASE, AMYLASE in the last 168 hours. No results for input(s): AMMONIA in the last 168 hours. Coagulation Profile: Recent Labs  Lab 04/03/21 0543  INR 1.4*   Cardiac Enzymes: No results for input(s): CKTOTAL, CKMB, CKMBINDEX, TROPONINI in the last 168 hours. BNP (last 3 results) No results for input(s): PROBNP in the last 8760 hours. HbA1C: No results for input(s): HGBA1C in the last 72 hours. CBG: Recent Labs  Lab  04/04/21 0740 04/04/21 1210 04/04/21 1802 04/04/21 2300 04/05/21 0802  GLUCAP 269* 91 115* 223* 179*   Lipid Profile: No results for input(s): CHOL, HDL, LDLCALC, TRIG, CHOLHDL, LDLDIRECT in the last 72 hours. Thyroid Function Tests: No results for input(s): TSH, T4TOTAL, FREET4, T3FREE, THYROIDAB in the last 72 hours. Anemia Panel: No results for input(s): VITAMINB12, FOLATE, FERRITIN, TIBC, IRON, RETICCTPCT in the last 72 hours. Sepsis Labs: Recent Labs  Lab 04/02/21 1801  LATICACIDVEN 1.1    Recent Results (from the past 240 hour(s))  Culture, blood (routine x 2)     Status: None (Preliminary result)   Collection Time: 04/02/21  6:01 PM   Specimen: Left Antecubital; Blood  Result Value Ref Range Status   Specimen Description LEFT ANTECUBITAL  Final   Special Requests   Final    BOTTLES DRAWN AEROBIC AND ANAEROBIC Blood Culture results may not be optimal due to an excessive volume of blood received in culture bottles   Culture   Final    NO GROWTH 3 DAYS Performed at [facility], [address] [place], [place] [address]    Report Status PENDING  Incomplete  Culture, blood (routine x 2)     Status: None (Preliminary result)   Collection Time: 04/02/21  8:15 PM   Specimen: BLOOD  Result Value Ref Range Status   Specimen Description BLOOD BLOOD LEFT HAND  Final   Special Requests   Final    BOTTLES DRAWN AEROBIC AND ANAEROBIC Blood Culture adequate volume   Culture   Final    NO GROWTH 3 DAYS Performed at [facility], [address] [place], [place] [address]    Report Status PENDING  Incomplete  SARS CORONAVIRUS 2 (TAT 6-24 HRS) Nasopharyngeal Nasopharyngeal Swab     Status: None   Collection Time: 04/02/21  8:20 PM   Specimen: Nasopharyngeal Swab  Result Value Ref Range Status   SARS Coronavirus 2 NEGATIVE NEGATIVE Final    Comment: (NOTE) SARS-CoV-2 target nucleic acids are NOT DETECTED.  The SARS-CoV-2 RNA is generally detectable in  upper and lower respiratory specimens during the acute phase of infection. Negative results do not preclude SARS-CoV-2 infection, do not rule out co-infections with other pathogens, and should not be used as the sole basis for treatment or other patient management decisions. Negative results must be combined with clinical observations, patient history, and epidemiological information. The expected result is Negative.  Fact Sheet for Patients: SugarRoll.be  Fact Sheet for Healthcare Providers: https://www.woods-mathews.com/  This test is not yet approved or cleared  by the [place] and  has been authorized for detection and/or diagnosis of SARS-CoV-2 by FDA under an Emergency Use Authorization (EUA). This EUA will remain  in effect (meaning this test can be used) for the duration of the COVID-19 declaration under Se ction 564(b)(1) of the Act, 21 U.S.C. section 360bbb-3(b)(1), unless the authorization is terminated or revoked sooner.  Performed at [facility], [place] [address], [place], [place] [address]   Aerobic Culture w Gram Stain (superficial specimen)     Status: None (Preliminary result)   Collection Time: 04/03/21  2:34 PM   Specimen: Wound  Result Value Ref Range Status   Specimen Description   Final    WOUND Performed at [facility], [address] [place], [place] [address]    Special Requests   Final    LEFT FOOT Performed at [facility], [address] [place], [place] [address]    Gram Stain   Final    RARE WBC PRESENT,BOTH PMN AND MONONUCLEAR RARE GRAM POSITIVE RODS Performed at [facility], [place] [address], [place], [place] [address]    Culture PENDING  Incomplete   Report Status PENDING  Incomplete         Radiology Studies: No results found.      Scheduled Meds: . atorvastatin  80 mg Oral QHS  . carvedilol  12.5 mg Oral BID WC  . Chlorhexidine Gluconate  Cloth  6 each Topical Q0600  . heparin injection (subcutaneous)  5,000 Units Subcutaneous Q12H  . insulin aspart  0-15 Units Subcutaneous TID PC & HS  . lidocaine  1 patch Transdermal Daily  . pantoprazole  40 mg Oral BID AC  . sodium chloride flush  3 mL Intravenous Q12H   Continuous Infusions: . sodium chloride    . ceFEPime (MAXIPIME) IV Stopped (04/04/21 0546)  . vancomycin Stopped (04/04/21 1804)    Assessment & Plan:   Active Problems:   Cellulitis in diabetic foot ([place])   1.  Diabetic right foot moderate nonpurulent cellulitis secondary to infected ulcer in the setting of immunosuppression with diabetes mellitus. IDs input was appreciated. Cultures done on largest wound to rule out resistant organism Continue Vanco and cefepime Continue on ivabx with cefepime and vancomycin Since difficult to assess pt's peripheral pulses /pedal , foot cooler to touch, will consult vascular surgery, input appreciated-we will obtain ABIs initially 4/16-vascular following-he may require angiography with intervention to improve his circulation to allow for wound healing per vascular  Podiatry consulted input was appreciated  X-ray negative on the right foot as far as osteomyelitis. Podiatry recommends IV antibiotics to be continue to monitor the wound.  Consider OR debridement however await arterial studies and vascular's input prior to surgery if needed for debridement.   2.  Type II diabetes mellitus with mild hyperglycemia. Patient refusing insulin 4/16 noncompliant with diet Blood glucose levels stable Discussed glycemic control with the patient for better wound healing Only on R ISS as he was refusing Lantus   3.  End-stage renal disease on hemodialysis. 4/15  Next dialysis Monday  Nephrology following  Lasix 80 mg daily was started on his nondialysis days Epogen as outpatient     4.  Dyslipidemia. Continue statins  5.  Essential hypertension. Resume Coreg and Norvasc    6.  GERD. -We will continue PPI therapy.  7.  Coronary artery disease. -We will continue beta-blocker therapy, Imdur and aspirin as well as statin therapy.  DVT prophylaxis: heparin Code Status:full Family Communication: none at bedside  Status is: Inpatient  The patient remains inpatient due to severity of illness and requiring IV treatment.  Dispo: The patient is from:home              Anticipated d/c is to: TBD/home              Patient currently not medically stable   Difficult to place patient:no            LOS: 3 days   Time spent: 35 min with >50% on coc    Nolberto Hanlon, MD [facility] Pager [phone]  If 7PM-7AM, please contact night-coverage 04/05/2021, 8:24 AM

## 2021-04-06 DIAGNOSIS — L97512 Non-pressure chronic ulcer of other part of right foot with fat layer exposed: Secondary | ICD-10-CM

## 2021-04-06 LAB — GLUCOSE, CAPILLARY
Glucose-Capillary: 110 mg/dL — ABNORMAL HIGH (ref 70–99)
Glucose-Capillary: 169 mg/dL — ABNORMAL HIGH (ref 70–99)
Glucose-Capillary: 199 mg/dL — ABNORMAL HIGH (ref 70–99)
Glucose-Capillary: 111 mg/dL — ABNORMAL HIGH (ref 70–99)

## 2021-04-06 MED ORDER — CYCLOBENZAPRINE HCL 10 MG PO TABS
5.0000 mg | ORAL_TABLET | Freq: Three times a day (TID) | ORAL | Status: DC | PRN
  Administered 2021-04-06 – 2021-04-08 (×5): 5 mg via ORAL
  Filled 2021-04-06 (×5): qty 1

## 2021-04-06 MED ORDER — DOXYCYCLINE HYCLATE 100 MG PO TABS
100.0000 mg | ORAL_TABLET | Freq: Two times a day (BID) | ORAL | Status: DC
  Administered 2021-04-06 – 2021-04-10 (×8): 100 mg via ORAL
  Filled 2021-04-06 (×8): qty 1

## 2021-04-06 MED ORDER — CIPROFLOXACIN HCL 500 MG PO TABS
500.0000 mg | ORAL_TABLET | Freq: Every day | ORAL | Status: DC
  Administered 2021-04-06: 500 mg via ORAL
  Filled 2021-04-06: qty 1

## 2021-04-06 NOTE — Progress Notes (Signed)
PHARMACY NOTE:  ANTIMICROBIAL RENAL DOSAGE ADJUSTMENT  Current antimicrobial regimen includes a mismatch between antimicrobial dosage and estimated renal function.  As per policy approved by the Pharmacy & Therapeutics and Medical Executive Committees, the antimicrobial dosage will be adjusted accordingly.  Current antimicrobial dosage: ciprofloxacin 500 mg q12h  Indication: foot infection   Renal Function:  Estimated Creatinine Clearance: 14.7 mL/min (A) (by C-G formula based on SCr of 5.3 mg/dL (H)). [x]      On intermittent HD, scheduled: MWF    Antimicrobial dosage has been changed to: ciprofloxacin 500 mg q24h  Additional comments:   Thank you for allowing pharmacy to be a part of this patient's care.  Benn Moulder, PharmD Pharmacy Resident  04/06/2021 1:45 PM

## 2021-04-06 NOTE — Plan of Care (Signed)
  Problem: Education: Goal: Knowledge of General Education information will improve Description: Including pain rating scale, medication(s)/side effects and non-pharmacologic comfort measures Outcome: Progressing   Problem: Clinical Measurements: Goal: Ability to maintain clinical measurements within normal limits will improve Outcome: Progressing   Problem: Clinical Measurements: Goal: Will remain free from infection Outcome: Progressing   Problem: Clinical Measurements: Goal: Diagnostic test results will improve Outcome: Progressing   Problem: Activity: Goal: Risk for activity intolerance will decrease Outcome: Progressing

## 2021-04-06 NOTE — Progress Notes (Signed)
PROGRESS NOTE    Richard Zavala  WJX:[id number] DOB: 1961/02/17 DOA: 04/02/2021 PCP: Midge Minium, PA    Brief Narrative:  Richard Zavala is a 60 y.o. Caucasian male with medical history significant for multiple medical problems that are mentioned below including CHF, coronary artery disease, end-stage renal disease on hemodialysis, hypertension, dyslipidemia, peripheral neuropathy, CVA and BPH, who presented to the ER with acute onset of worsening right foot swelling with erythema and mild extension to the right leg for last 4 days.  The patient had ORIF of the left leg during recent admission here and was discharged on 03/25/2021.  He has a right foot ulcer and scattered healed ulcers over his foot and leg.  He denied any fever or chills.  He has no problems currently from his left leg.  Earlier today he finished his session of hemodialysis that he gets on Monday, Wednesday and Fridays.  No chest pain or palpitations.  No reported cough or wheezing or dyspnea.  ED Course: Upon presentation to the ER, temperature was 100 with a blood pressure 137/99 with otherwise normal vital signs.  Labs revealed blood glucose of 216 and a BUN of 39 with creatinine 4.82 with anion gap of 13.  Albumin was 2.6.  Lactic acid was 1.1.  CBC showed anemia close to his baseline.  Blood culture was drawn.  4/14- no overnight issues. Saw pt this am. In the after noon nsg stated pts bp 81/68, repeat with manual 153/71.  Was planning to give bolus and midodrine, but d/c'd since repeat with manual was stable.  4/15-found with two chocolate bars son brought to him. Refused insulin. Discussed good glycemic control, but appears not to care what I am trying to discuss about it  4/16-sunkist regular at bedside. Pt c/o why HD did not take a lot of fluid from him, he wanted more fluid taken out but they refused. No other issues  4/17 c/o back spasm. Lots iv line multiple times.   Consultants:   ID, vascular surgery                                     Procedures:  Antimicrobials:      Subjective: No sob, cp, abd pain,foot pain.   Objective: Vitals:   04/05/21 2032 04/06/21 0045 04/06/21 0457 04/06/21 0810  BP: 123/60 (!) 92/48 (!) 129/54 138/82  Pulse:   64 65  Resp: 18 16 18 16   Temp: (!) 97.3 F (36.3 C)  97.6 F (36.4 C) (!) 97.5 F (36.4 C)  TempSrc: Oral  Oral Oral  SpO2: 97% 100% 98% 96%  Weight:      Height:        Intake/Output Summary (Last 24 hours) at 04/06/2021 0918 Last data filed at 04/06/2021 0653 Gross per 24 hour  Intake 480 ml  Output --  Net 480 ml   Filed Weights   04/02/21 1755 04/03/21 0130  Weight: 71 kg 73.9 kg    Examination: Nad.  cta no w/r/r Regular s1/s2 Soft benign +bs RLE wrapped Lt foot wrapped, +edema, +erythema, mildly less Awake and alert, grossly intact  Data Reviewed: I have personally reviewed following labs and imaging studies  CBC: Recent Labs  Lab 04/02/21 1801 04/03/21 0543  WBC 6.6 5.9  NEUTROABS 4.9  --   HGB 9.4* 9.4*  HCT 29.5* 29.6*  MCV 98.0 98.7  PLT 234 211  Basic Metabolic Panel: Recent Labs  Lab 04/02/21 1801 04/03/21 0543  NA 135 138  K 4.4 4.2  CL 99 101  CO2 23 25  GLUCOSE 216* 55*  BUN 39* 49*  CREATININE 4.82* 5.30*  CALCIUM 8.3* 8.4*   GFR: Estimated Creatinine Clearance: 14.7 mL/min (A) (by C-G formula based on SCr of 5.3 mg/dL (H)). Liver Function Tests: Recent Labs  Lab 04/02/21 1801  AST 19  ALT 16  ALKPHOS 119  BILITOT 0.8  PROT 6.3*  ALBUMIN 2.6*   No results for input(s): LIPASE, AMYLASE in the last 168 hours. No results for input(s): AMMONIA in the last 168 hours. Coagulation Profile: Recent Labs  Lab 04/03/21 0543  INR 1.4*   Cardiac Enzymes: No results for input(s): CKTOTAL, CKMB, CKMBINDEX, TROPONINI in the last 168 hours. BNP (last 3 results) No results for input(s): PROBNP in the last 8760 hours. HbA1C: No results for input(s): HGBA1C in the last 72  hours. CBG: Recent Labs  Lab 04/05/21 0802 04/05/21 1236 04/05/21 1641 04/05/21 2109 04/06/21 0728  GLUCAP 179* 152* 233* 205* 110*   Lipid Profile: No results for input(s): CHOL, HDL, LDLCALC, TRIG, CHOLHDL, LDLDIRECT in the last 72 hours. Thyroid Function Tests: No results for input(s): TSH, T4TOTAL, FREET4, T3FREE, THYROIDAB in the last 72 hours. Anemia Panel: No results for input(s): VITAMINB12, FOLATE, FERRITIN, TIBC, IRON, RETICCTPCT in the last 72 hours. Sepsis Labs: Recent Labs  Lab 04/02/21 1801  LATICACIDVEN 1.1    Recent Results (from the past 240 hour(s))  Culture, blood (routine x 2)     Status: None (Preliminary result)   Collection Time: 04/02/21  6:01 PM   Specimen: Left Antecubital; Blood  Result Value Ref Range Status   Specimen Description LEFT ANTECUBITAL  Final   Special Requests   Final    BOTTLES DRAWN AEROBIC AND ANAEROBIC Blood Culture results may not be optimal due to an excessive volume of blood received in culture bottles   Culture   Final    NO GROWTH 4 DAYS Performed at [facility], [address], [place], [place] [address]    Report Status PENDING  Incomplete  Culture, blood (routine x 2)     Status: None (Preliminary result)   Collection Time: 04/02/21  8:15 PM   Specimen: BLOOD  Result Value Ref Range Status   Specimen Description BLOOD BLOOD LEFT HAND  Final   Special Requests   Final    BOTTLES DRAWN AEROBIC AND ANAEROBIC Blood Culture adequate volume   Culture   Final    NO GROWTH 4 DAYS Performed at [facility], [address], [place], [place] [address]    Report Status PENDING  Incomplete  SARS CORONAVIRUS 2 (TAT 6-24 HRS) Nasopharyngeal Nasopharyngeal Swab     Status: None   Collection Time: 04/02/21  8:20 PM   Specimen: Nasopharyngeal Swab  Result Value Ref Range Status   SARS Coronavirus 2 NEGATIVE NEGATIVE Final    Comment: (NOTE) SARS-CoV-2 target nucleic acids are NOT DETECTED.  The SARS-CoV-2  RNA is generally detectable in upper and lower respiratory specimens during the acute phase of infection. Negative results do not preclude SARS-CoV-2 infection, do not rule out co-infections with other pathogens, and should not be used as the sole basis for treatment or other patient management decisions. Negative results must be combined with clinical observations, patient history, and epidemiological information. The expected result is Negative.  Fact Sheet for Patients: SugarRoll.be  Fact Sheet for Healthcare Providers:  https://www.woods-mathews.com/  This test is not yet approved or cleared by the [place] and  has been authorized for detection and/or diagnosis of SARS-CoV-2 by FDA under an Emergency Use Authorization (EUA). This EUA will remain  in effect (meaning this test can be used) for the duration of the COVID-19 declaration under Se ction 564(b)(1) of the Act, 21 U.S.C. section 360bbb-3(b)(1), unless the authorization is terminated or revoked sooner.  Performed at [facility], [place] [address], [place], [place] [address]   Aerobic Culture w Gram Stain (superficial specimen)     Status: None (Preliminary result)   Collection Time: 04/03/21  2:34 PM   Specimen: Wound  Result Value Ref Range Status   Specimen Description   Final    WOUND Performed at [facility], [address], [place], [place] [address]    Special Requests   Final    LEFT FOOT Performed at [facility], [place] [place], [place] [address]    Gram Stain   Final    RARE WBC PRESENT,BOTH PMN AND MONONUCLEAR RARE GRAM POSITIVE RODS    Culture   Final    CULTURE REINCUBATED FOR BETTER GROWTH Performed at [facility], [place] [address] [place],  [address]    Report Status PENDING  Incomplete         Radiology Studies: No results found.      Scheduled Meds: . atorvastatin  80 mg Oral QHS  .  carvedilol  12.5 mg Oral BID WC  . Chlorhexidine Gluconate Cloth  6 each Topical Q0600  . furosemide  80 mg Oral Q T,Th,S,Su  . heparin injection (subcutaneous)  5,000 Units Subcutaneous Q12H  . insulin aspart  0-15 Units Subcutaneous TID PC & HS  . lidocaine  1 patch Transdermal Daily  . pantoprazole  40 mg Oral BID AC  . sodium chloride flush  3 mL Intravenous Q12H   Continuous Infusions: . sodium chloride    . ceFEPime (MAXIPIME) IV Stopped (04/04/21 0546)  . vancomycin Stopped (04/04/21 1804)    Assessment & Plan:   Active Problems:   Cellulitis in diabetic foot ([place])   1.  Diabetic right foot moderate nonpurulent cellulitis secondary to infected ulcer in the setting of immunosuppression with diabetes mellitus. IDs input was appreciated. Cultures done on largest wound to rule out resistant organism Continue Vanco and cefepime Continue on ivabx with cefepime and vancomycin Since difficult to assess pt's peripheral pulses /pedal , foot cooler to touch, will consult vascular surgery, input appreciated-we will obtain ABIs initially 4/16-vascular following-he may require angiography with intervention to improve his circulation to allow for wound healing per vascular  Podiatry consulted input was appreciated  X-ray negative on the right foot as far as osteomyelitis. Podiatry recommends IV antibiotics to be continue to monitor the wound.  Consider OR debridement however await arterial studies and vascular's input prior to surgery if needed for debridement. 4/17-discussed with ID Dr. Ola Spurr via chat about po abx option as pt has lost iv site multiple times since yesterday. Recommended ciprofloxacin and doxycycline.  2.  Type II diabetes mellitus with mild hyperglycemia. Patient refusing insulin 4/16 noncompliant with diet Discussed glycemic control with the patient for better wound healing Only on R ISS as he was refusing Lantus 4/17- bg stable  Continue RISS    3.   End-stage renal disease on hemodialysis. Next HD Monday Nephrology following Lasix 80mg  started on non HD days Epogen as outpt  4.  Dyslipidemia. Continue statins  5.  Essential hypertension. Resume Coreg and Norvasc   6.  GERD. - continue PPI therapy.  7.  Coronary artery disease. -We will continue beta-blocker therapy, Imdur and aspirin as well as statin therapy.  PT/OT  DVT prophylaxis: heparin Code Status:full Family Communication: none at bedside  Status is: Inpatient  The patient remains inpatient due to severity of illness and requiring IV treatment.  Dispo: The patient is from:home              Anticipated d/c is to: TBD/home              Patient currently not medically stable   Difficult to place patient:no            LOS: 4 days   Time spent: 35 min with >50% on coc    Nolberto Hanlon, MD [facility] Pager [phone]  If 7PM-7AM, please contact night-coverage 04/06/2021, 9:18 AM

## 2021-04-06 NOTE — Progress Notes (Signed)
Secure chat sent to hospitalist [facility], about patient not having an IV for his IV antibiotics this am.   0518- Will defer to AM MD for IV access or po antibiotics. IV antibiotics not given due to no IV access at this time. Will follow up with day shift nurse and MD.

## 2021-04-06 NOTE — Progress Notes (Signed)
PT Cancellation Note  Patient Details Name: Richard Zavala MRN: [id number] DOB: 1961/10/01   Cancelled Treatment:    Reason Eval/Treat Not Completed: Other (comment). Pt sleeping soundly upon PT entering room. Per RN pt has had significant pain today and needs a chance to rest/sleep. PT to re-attempt as able.    Lieutenant Diego PT, DPT 2:25 PM,04/06/21

## 2021-04-06 NOTE — Progress Notes (Signed)
[facility]  ROUNDING NOTE   Subjective:    Reports he is eating well. Denies any shortness of breath. He feels that his fluid has improved. Patient states his IV's " fell out" when the tape got stuck on the bed sheet.    Objective:  Vital signs in last 24 hours:  Temp:  [97.3 F (36.3 C)-97.7 F (36.5 C)] 97.5 F (36.4 C) (04/17 0810) Pulse Rate:  [64-74] 65 (04/17 0810) Resp:  [15-18] 16 (04/17 0810) BP: (92-147)/(48-113) 138/82 (04/17 0810) SpO2:  [96 %-100 %] 96 % (04/17 0810)  Weight change:  Filed Weights   04/02/21 1755 04/03/21 0130  Weight: 71 kg 73.9 kg    Intake/Output: I/O last 3 completed shifts: In: 480 [P.O.:480] Out: 50 [Urine:50]   Intake/Output this shift:  Total I/O In: 240 [P.O.:240] Out: -   Physical Exam: General: NAD, sitting up in bed   Head: Normocephalic, atraumatic. Moist oral mucosal membranes  Eyes: Anicteric, PERRL  Neck: Supple, trachea midline  Lungs:  Clear to auscultation  Heart: Regular rate and rhythm  Abdomen:  Soft, nontender, non distended.   Extremities:  trace peripheral edema in right ankle, right foot currently dressed, left leg wrapped in dressing.   Neurologic: Nonfocal, moving all four extremities  Skin: Ulcerations to the right foot  Access: Right UE AVF     Basic Metabolic Panel: Recent Labs  Lab 04/02/21 1801 04/03/21 0543  NA 135 138  K 4.4 4.2  CL 99 101  CO2 23 25  GLUCOSE 216* 55*  BUN 39* 49*  CREATININE 4.82* 5.30*  CALCIUM 8.3* 8.4*    Liver Function Tests: Recent Labs  Lab 04/02/21 1801  AST 19  ALT 16  ALKPHOS 119  BILITOT 0.8  PROT 6.3*  ALBUMIN 2.6*   No results for input(s): LIPASE, AMYLASE in the last 168 hours. No results for input(s): AMMONIA in the last 168 hours.  CBC: Recent Labs  Lab 04/02/21 1801 04/03/21 0543  WBC 6.6 5.9  NEUTROABS 4.9  --   HGB 9.4* 9.4*  HCT 29.5* 29.6*  MCV 98.0 98.7  PLT 234 211    Cardiac Enzymes: No results for  input(s): CKTOTAL, CKMB, CKMBINDEX, TROPONINI in the last 168 hours.  BNP: Invalid input(s): POCBNP  CBG: Recent Labs  Lab 04/05/21 0802 04/05/21 1236 04/05/21 1641 04/05/21 2109 04/06/21 0728  GLUCAP 179* 152* 233* 205* 110*    Microbiology: Results for orders placed or performed during the hospital encounter of 04/02/21  Culture, blood (routine x 2)     Status: None (Preliminary result)   Collection Time: 04/02/21  6:01 PM   Specimen: Left Antecubital; Blood  Result Value Ref Range Status   Specimen Description LEFT ANTECUBITAL  Final   Special Requests   Final    BOTTLES DRAWN AEROBIC AND ANAEROBIC Blood Culture results may not be optimal due to an excessive volume of blood received in culture bottles   Culture   Final    NO GROWTH 4 DAYS Performed at [facility], [address], [place], [place] [address]    Report Status PENDING  Incomplete  Culture, blood (routine x 2)     Status: None (Preliminary result)   Collection Time: 04/02/21  8:15 PM   Specimen: BLOOD  Result Value Ref Range Status   Specimen Description BLOOD BLOOD LEFT HAND  Final   Special Requests   Final    BOTTLES DRAWN AEROBIC AND ANAEROBIC Blood Culture adequate volume  Culture   Final    NO GROWTH 4 DAYS Performed at [facility], [place], [place] [address]    Report Status PENDING  Incomplete  SARS CORONAVIRUS 2 (TAT 6-24 HRS) Nasopharyngeal Nasopharyngeal Swab     Status: None   Collection Time: 04/02/21  8:20 PM   Specimen: Nasopharyngeal Swab  Result Value Ref Range Status   SARS Coronavirus 2 NEGATIVE NEGATIVE Final    Comment: (NOTE) SARS-CoV-2 target nucleic acids are NOT DETECTED.  The SARS-CoV-2 RNA is generally detectable in upper and lower respiratory specimens during the acute phase of infection. Negative results do not preclude SARS-CoV-2 infection, do not rule out co-infections with other pathogens, and should not be used as the sole  basis for treatment or other patient management decisions. Negative results must be combined with clinical observations, patient history, and epidemiological information. The expected result is Negative.  Fact Sheet for Patients: SugarRoll.be  Fact Sheet for Healthcare Providers: https://www.woods-mathews.com/  This test is not yet approved or cleared by the [place] FDA and  has been authorized for detection and/or diagnosis of SARS-CoV-2 by FDA under an Emergency Use Authorization (EUA). This EUA will remain  in effect (meaning this test can be used) for the duration of the COVID-19 declaration under Se ction 564(b)(1) of the Act, 21 U.S.C. section 360bbb-3(b)(1), unless the authorization is terminated or revoked sooner.  Performed at [facility], [place] [address], [place], [place] [address]   Aerobic Culture w Gram Stain (superficial specimen)     Status: None (Preliminary result)   Collection Time: 04/03/21  2:34 PM   Specimen: Wound  Result Value Ref Range Status   Specimen Description   Final    WOUND Performed at [facility], [address] [place], [place] [address]    Special Requests   Final    LEFT FOOT Performed at [facility], [address] [place], [place] [address]    Gram Stain   Final    RARE WBC PRESENT,BOTH PMN AND MONONUCLEAR RARE GRAM POSITIVE RODS    Culture   Final    CULTURE REINCUBATED FOR BETTER GROWTH Performed at [facility], [place] [address] [place], [place] [address]    Report Status PENDING  Incomplete    Coagulation Studies: No results for input(s): LABPROT, INR in the last 72 hours.  Urinalysis: No results for input(s): COLORURINE, LABSPEC, PHURINE, GLUCOSEU, HGBUR, BILIRUBINUR, KETONESUR, PROTEINUR, UROBILINOGEN, NITRITE, LEUKOCYTESUR in the last 72 hours.  Invalid input(s): APPERANCEUR    Imaging: No results found.   Medications:   . sodium  chloride    . ceFEPime (MAXIPIME) IV Stopped (04/04/21 0546)  . vancomycin Stopped (04/04/21 1804)   . atorvastatin  80 mg Oral QHS  . carvedilol  12.5 mg Oral BID WC  . Chlorhexidine Gluconate Cloth  6 each Topical Q0600  . furosemide  80 mg Oral Q T,Th,S,Su  . heparin injection (subcutaneous)  5,000 Units Subcutaneous Q12H  . insulin aspart  0-15 Units Subcutaneous TID PC & HS  . lidocaine  1 patch Transdermal Daily  . pantoprazole  40 mg Oral BID AC  . sodium chloride flush  3 mL Intravenous Q12H   sodium chloride, acetaminophen **OR** acetaminophen, albuterol, docusate sodium, magnesium hydroxide, morphine injection, ondansetron **OR** ondansetron (ZOFRAN) IV, oxyCODONE, sodium chloride flush, traZODone  Assessment/ Plan:  [name] is a 60 y.o.  male with past medical history of ESRD on hemodialysis,  anemia, diabetes, CAD, CHF, pulmonary hypertension and osteoporosis. He presents to the ED with leg swelling found to be cellulitis.   [facility]/MWF/ Rt lower AVF/ 72kg  1. ESRD on HD - Will maintain outpatient schedule - HD friday: Net UF of 3000 mL - Next treatment will be Monday, no indication for treatment today   2. Anemia of CKD  - EPO given as outpatient  - hgb 9.4   3. Secondary hyperparathyroidism of renal origin  - may need to start a phosphorus binder.  - phos elevated to 6 on April 4.   4. Diabetes type 2 with CKD  - primary team managing   5. Cellulitis of lower extremity  - patient may need to be changed to oral abx, if possible, due to removing iv access.       LOS: [place] 4/17/202211:00 AM

## 2021-04-06 NOTE — NC FL2 (Signed)
[place] LEVEL OF CARE SCREENING TOOL     IDENTIFICATION  Patient Name: Richard Zavala Birthdate: 1961-09-15 Sex: male Admission Date (Current Location): 04/02/2021  [place] and [place] Number:  Engineering geologist and Address:  [facility], [address], [place], [place] [address]      Provider Number: [id number]  Attending Physician Name and Address:  Nolberto Hanlon, MD  Relative Name and Phone Number:  Nachman, Sundt)   [phone] [facility] Phone)    Current Level of Care: Hospital Recommended Level of Care: [place] Prior Approval Number:    Date Approved/Denied:   PASRR Number: [id number]  Discharge Plan:      Current Diagnoses: Patient Active Problem List   Diagnosis Date Noted  . Cellulitis in diabetic foot ([place]) 04/02/2021  . Tibia/fibula fracture, left, closed, with delayed healing, subsequent encounter 03/20/2021  . Sepsis ([place]) 02/12/2021  . Acute metabolic encephalopathy 00/76/2263  . Acute respiratory failure with hypoxia ([place]) 02/12/2021  . Anemia in ESRD (end-stage renal disease) ([place]) 02/12/2021  . HLD (hyperlipidemia) 02/12/2021  . Stroke ([place]) 02/12/2021  . Hypothermia 02/12/2021  . CAP (community acquired pneumonia) 02/12/2021  . ESRD on hemodialysis ([place])   . Acute cystitis 02/06/2021  . Acute upper GI bleed 02/06/2021  . Acute blood loss anemia 02/06/2021  . COVID-19 virus infection 02/06/2021  . Gait instability 10/12/2019  . Pulmonary nodule 10/12/2019  . CAD (coronary artery disease), native coronary artery 03/08/2018  . Hyperlipidemia 03/08/2018  . Essential hypertension 03/08/2018  . Wilms' tumor with favorable history 03/01/2018  . Closed fracture of right distal radius 08/22/2016  . TIA (transient ischemic attack) 08/21/2016  . Encounter for pre-transplant evaluation for kidney transplant 05/17/2016  . Anemia due to other cause   . Hyperkalemia   . Hypoglycemia   . Pain    . End stage renal disease ([place])   . Weakness   . Other chest pain   . Elevated troponin   . Hyperkalemia, diminished renal excretion 08/14/2015  . Hypertensive urgency 03/26/2015  . Anemia in CKD (chronic kidney disease) 12/04/2014  . Chronic kidney disease, stage IV (severe) ([place]) 12/03/2014  . Acute ischemic stroke ([place]) 08/25/2014  . Acute lacunar stroke ([place]) 06/25/2014  . Orthostatic hypotension 06/25/2014  . Diabetic vitreous hemorrhage associated with type 2 diabetes mellitus ([place]) 09/28/2013  . PDR (proliferative diabetic retinopathy) ([place]) 09/28/2013  . History of Wilms' tumor 08/15/2013  . Iron deficiency anemia 07/21/2013  . Mesenteric artery stenosis ([place]) 03/27/2013  . Heart failure, chronic systolic ([place]) 33/54/5625  . Ischemic cardiomyopathy 03/13/2013  . Pulmonary HTN ([place]) 03/13/2013  . Peripheral edema 02/28/2013  . Diabetic macular edema of both eyes ([place]) 01/16/2013  . Corneal epithelial defect 10/06/2012  . TRD (traction retinal detachment) 09/22/2012  . Vitreous hemorrhage of both eyes ([place]) 07/21/2012  . Closed right hip fracture ([place]) 02/19/2012  . Diabetic sensorimotor polyneuropathy ([place]) 01/27/2012  . Vitamin D deficiency disease 11/30/2011  . Type II diabetes mellitus with renal manifestations ([place]) 07/03/2011  . Type 2 diabetes mellitus with kidney complication, with long-term current use of insulin ([place]) 07/03/2011  . Osteoporosis with fracture 05/28/2011    Orientation RESPIRATION BLADDER Height & Weight     Time,Situation,Place,Self  Normal Continent (oliguria) Weight: 162 lb 14.7 oz (73.9 kg) Height:  5\' 8"  (172.7 cm)  BEHAVIORAL SYMPTOMS/MOOD NEUROLOGICAL BOWEL NUTRITION STATUS      Continent Diet (heart healthy/carb modified diet; thin liquids)  AMBULATORY STATUS COMMUNICATION  OF NEEDS Skin   Extensive Assist Verbally Surgical wounds,Skin abrasions (L leg, R foot, arm)                       Personal Care Assistance Level of  Assistance  Bathing,Feeding,Dressing   Feeding assistance: Maximum assistance Dressing Assistance: Maximum assistance     Functional Limitations Info             SPECIAL CARE FACTORS FREQUENCY  PT (By licensed PT),OT (By licensed OT)     PT Frequency: 5 x/week OT Frequency: 5 x/week            Contractures      Additional Factors Info  Code Status,Allergies Code Status Info: full code Allergies Info: metformin, penicillins           Current Medications (04/06/2021):  This is the current hospital active medication list Current Facility-Administered Medications  Medication Dose Route Frequency Provider Last Rate Last Admin  . 0.9 %  sodium chloride infusion  250 mL Intravenous PRN Mansy, Jan A, MD      . acetaminophen (TYLENOL) tablet 650 mg  650 mg Oral Q6H PRN Mansy, Jan A, MD   650 mg at 04/05/21 1753   Or  . acetaminophen (TYLENOL) suppository 650 mg  650 mg Rectal Q6H PRN Mansy, Jan A, MD      . albuterol (VENTOLIN HFA) 108 (90 Base) MCG/ACT inhaler 2 puff  2 puff Inhalation Q6H PRN Mansy, Jan A, MD      . atorvastatin (LIPITOR) tablet 80 mg  80 mg Oral QHS Mansy, Jan A, MD   80 mg at 04/05/21 2151  . carvedilol (COREG) tablet 12.5 mg  12.5 mg Oral BID WC Nolberto Hanlon, MD   12.5 mg at 04/06/21 0858  . ceFEPIme (MAXIPIME) 1 g in sodium chloride 0.9 % 100 mL IVPB  1 g Intravenous Q24H Darnelle Bos, [facility]   Stopped at 04/04/21 0546  . Chlorhexidine Gluconate Cloth 2 % PADS 6 each  6 each Topical Q0600 Colon Flattery, NP   6 each at 04/06/21 0521  . docusate sodium (COLACE) capsule 100 mg  100 mg Oral BID PRN Mansy, Jan A, MD   100 mg at 04/05/21 0109  . furosemide (LASIX) tablet 80 mg  80 mg Oral Q T,Th,S,Su Tedrow, Madison P, PA-C   80 mg at 04/06/21 0858  . heparin injection 5,000 Units  5,000 Units Subcutaneous Q12H Mansy, Arvella Merles, MD   5,000 Units at 04/06/21 0858  . insulin aspart (novoLOG) injection 0-15 Units  0-15 Units Subcutaneous TID PC & HS Mansy, Arvella Merles,  MD   3 Units at 04/05/21 2151  . lidocaine (LIDODERM) 5 % 1 patch  1 patch Transdermal Daily Mansy, Jan A, MD   1 patch at 04/05/21 0840  . magnesium hydroxide (MILK OF MAGNESIA) suspension 30 mL  30 mL Oral Daily PRN Mansy, Jan A, MD      . morphine 2 MG/ML injection 1 mg  1 mg Intravenous Q4H PRN Nolberto Hanlon, MD      . ondansetron (ZOFRAN) tablet 4 mg  4 mg Oral Q6H PRN Mansy, Jan A, MD       Or  . ondansetron [facility]) injection 4 mg  4 mg Intravenous Q6H PRN Mansy, Jan A, MD      . oxyCODONE (Oxy IR/ROXICODONE) immediate release tablet 5 mg  5 mg Oral Q4H PRN Mansy, Arvella Merles, MD      .  pantoprazole (PROTONIX) EC tablet 40 mg  40 mg Oral BID AC Mansy, Jan A, MD   40 mg at 04/06/21 0858  . sodium chloride flush (NS) 0.9 % injection 3 mL  3 mL Intravenous Q12H Mansy, Jan A, MD   3 mL at 04/04/21 0807  . sodium chloride flush (NS) 0.9 % injection 3 mL  3 mL Intravenous PRN Mansy, Jan A, MD      . traZODone (DESYREL) tablet 25 mg  25 mg Oral QHS PRN Mansy, Jan A, MD   25 mg at 04/05/21 2140  . vancomycin (VANCOCIN) 750 mg in sodium chloride 0.9 % 250 mL IVPB  750 mg Intravenous Q M,W,F-HD Dorothe Pea, [place] at 04/04/21 3128     Discharge Medications: Please see discharge summary for a list of discharge medications.  Relevant Imaging Results:  Relevant Lab Results:   Additional Information SS #: [id number]  [place], LCSW

## 2021-04-06 NOTE — Progress Notes (Addendum)
Secure chat to Dr. Kurtis Bushman to see if IV abx can be changed to PO. Patient continues to pull out his IV access.   Response received that MD will address ability to change antibiotics to PO on rounds.

## 2021-04-06 NOTE — Progress Notes (Signed)
Subjective: No acute overnight events, other than his IV came out. Currently no IV access. States the foot is doing well. No bandage is currently on the foot.  Denies any systemic complaints such as fevers, chills, nausea, vomiting.   Objective: AAO x3, NAD Also hearing on the dorsal aspect of the right foot just proximal to the fourth and fifth metatarsal adjustments.  Monitor fibrotic tissue present but there is some granulation tissue present.  Minimal amount of purulence.  There is no probing, undermining or tunneling.  No probing to bone.  Mild surrounding erythema but there is no ascending cellulitis.  There is no fluctuation or crepitation.  No malodor.  Superficial scabs present on the digits.  No other open lesions identified.  Splint intact to the left lower extremity.   No pain with calf compression, swelling, warmth, erythema  Assessment: Right foot ulceration with resolving cellulitis, PAD  Plan: -Independent review the x-rays.  No evidence of osteomyelitis.  Osteopenia is present as well as multiple old fractures.  If there is any concern for osteomyelitis would recommend MRI but at this time the wound appears stable.  -Wound culture from 4/14 shows gram positive rods, blood culture negative. If not able to get further IV access would switch to oral. The culture from 4/14 states it is the left foot but I believe this was from the right foot ulcer. -Awaiting further vascular testing- likely will need angio -Dressing reapplied today. Adaptic followed by DSD applied. May consider switching to santyl.   Celesta Gentile, DPM

## 2021-04-06 NOTE — TOC Initial Note (Addendum)
Transition of Care [facility]) - Initial/Assessment Note    Patient Details  Name: Richard Zavala MRN: [id number] Date of Birth: [birth year]  [facility] CM/SW Contact:    Magnus Ivan, LCSW Phone Number: 04/06/2021, 9:55 AM  Clinical Narrative:                CSW spoke with patient. Patient discharged from [facility] to [place] [facility] on 03/25/21 for short term rehab. Patient reported he and his daughter have been working on getting him transferred to [facility] to complete his rehab and he would like to go to [facility] when discharged. CSW will send patient's information to [facility] and other local SNFs as back up. Reached out to Chaplin at [facility] and asked her to review referral. Asked MD for new PT, OT consults. Patient will return home with his son when he completes rehab. Patient's PCP is [facility] and home pharmacy is [facility]. Patient has a RW, o2, and shower chair at home. Patient has not been vaccinated for COVID. Children provide transportation. Patient goes to [facility]    Expected Discharge Plan: [place] Barriers to Discharge: Continued Medical Work up   Patient Goals and CMS Choice Patient states their goals for this hospitalization and ongoing recovery are:: SNF rehab CMS Medicare.gov Compare Post Acute Care list provided to:: Patient Choice offered to / list presented to : Patient  Expected Discharge Plan and Services Expected Discharge Plan: [place]       Living arrangements for the past 2 months: Single Family Home                                      Prior Living Arrangements/Services Living arrangements for the past 2 months: Single Family Home Lives with:: Adult Children Patient language and need for interpreter reviewed:: Yes Do you feel safe going back to the place where you live?: Yes      Need for Family Participation in Patient Care: Yes  (Comment) Care giver support system in place?: Yes (comment) Current home services: DME Criminal Activity/Legal Involvement Pertinent to Current Situation/Hospitalization: No - Comment as needed  Activities of Daily Living Home Assistive Devices/Equipment: Gilford Rile (specify type) (2 wheel) ADL Screening (condition at time of admission) Patient's cognitive ability adequate to safely complete daily activities?: Yes Is the patient deaf or have difficulty hearing?: No Does the patient have difficulty seeing, even when wearing glasses/contacts?: Yes Does the patient have difficulty concentrating, remembering, or making decisions?: No Patient able to express need for assistance with ADLs?: Yes Does the patient have difficulty dressing or bathing?: Yes Independently performs ADLs?: Yes (appropriate for developmental age) Does the patient have difficulty walking or climbing stairs?: No Weakness of Legs: Left Weakness of Arms/Hands: None  Permission Sought/Granted Permission sought to share information with : Facility Retail banker granted to share information with : Yes, Verbal Permission Granted  Share Information with NAME: children  Permission granted to share info w AGENCY: SNFs        Emotional Assessment       Orientation: : Oriented to Self,Oriented to Place,Oriented to  Time,Oriented to Situation Alcohol / Substance Use: Not Applicable Psych Involvement: No (comment)  Admission diagnosis:  ESRD (end stage renal disease) ([place]) [N18.6] Cellulitis in diabetic foot ([place]) [N39.767, H41.937] Patient Active Problem List  Diagnosis Date Noted  . Cellulitis in diabetic foot ([place]) 04/02/2021  . Tibia/fibula fracture, left, closed, with delayed healing, subsequent encounter 03/20/2021  . Sepsis ([place]) 02/12/2021  . Acute metabolic encephalopathy 40/34/7425  . Acute respiratory failure with hypoxia ([place]) 02/12/2021  . Anemia in ESRD (end-stage renal  disease) ([place]) 02/12/2021  . HLD (hyperlipidemia) 02/12/2021  . Stroke ([place]) 02/12/2021  . Hypothermia 02/12/2021  . CAP (community acquired pneumonia) 02/12/2021  . ESRD on hemodialysis ([place])   . Acute cystitis 02/06/2021  . Acute upper GI bleed 02/06/2021  . Acute blood loss anemia 02/06/2021  . COVID-19 virus infection 02/06/2021  . Gait instability 10/12/2019  . Pulmonary nodule 10/12/2019  . CAD (coronary artery disease), native coronary artery 03/08/2018  . Hyperlipidemia 03/08/2018  . Essential hypertension 03/08/2018  . Wilms' tumor with favorable history 03/01/2018  . Closed fracture of right distal radius 08/22/2016  . TIA (transient ischemic attack) 08/21/2016  . Encounter for pre-transplant evaluation for kidney transplant 05/17/2016  . Anemia due to other cause   . Hyperkalemia   . Hypoglycemia   . Pain   . End stage renal disease ([place])   . Weakness   . Other chest pain   . Elevated troponin   . Hyperkalemia, diminished renal excretion 08/14/2015  . Hypertensive urgency 03/26/2015  . Anemia in CKD (chronic kidney disease) 12/04/2014  . Chronic kidney disease, stage IV (severe) ([place]) 12/03/2014  . Acute ischemic stroke ([place]) 08/25/2014  . Acute lacunar stroke ([place]) 06/25/2014  . Orthostatic hypotension 06/25/2014  . Diabetic vitreous hemorrhage associated with type 2 diabetes mellitus ([place]) 09/28/2013  . PDR (proliferative diabetic retinopathy) ([place]) 09/28/2013  . History of Wilms' tumor 08/15/2013  . Iron deficiency anemia 07/21/2013  . Mesenteric artery stenosis ([place]) 03/27/2013  . Heart failure, chronic systolic ([place]) 95/63/8756  . Ischemic cardiomyopathy 03/13/2013  . Pulmonary HTN ([place]) 03/13/2013  . Peripheral edema 02/28/2013  . Diabetic macular edema of both eyes ([place]) 01/16/2013  . Corneal epithelial defect 10/06/2012  . TRD (traction retinal detachment) 09/22/2012  . Vitreous hemorrhage of both eyes ([place]) 07/21/2012  . Closed right hip fracture  ([place]) 02/19/2012  . Diabetic sensorimotor polyneuropathy ([place]) 01/27/2012  . Vitamin D deficiency disease 11/30/2011  . Type II diabetes mellitus with renal manifestations ([place]) 07/03/2011  . Type 2 diabetes mellitus with kidney complication, with long-term current use of insulin ([place]) 07/03/2011  . Osteoporosis with fracture 05/28/2011   PCP:  Midge Minium, PA Pharmacy:   [facility] [address]), [place] [address] [place]) [place] [address] Phone: [phone] Fax: [phone]     Social Determinants of Health (SDOH) Interventions    Readmission Risk Interventions Readmission Risk Prevention Plan 04/06/2021 02/14/2021 02/09/2021  Transportation Screening Complete Complete -  PCP or Specialist Appt within 3-5 Days - - -  HRI or [place] for [place] Planning/Counseling - - Patient refused  Palliative Care Screening - - Patient Refused  Palliative Care Screening Not Complete Comments - - (No Data)  Medication Review Press photographer) Complete Complete Referral to Pharmacy  PCP or Specialist appointment within 3-5 days of discharge Complete Complete -  [place] or Home Care Consult Complete Patient refused -  SW Recovery Care/Counseling Consult Complete Patient refused -  Palliative Care Screening Complete Not Applicable -  [place] Complete Not Applicable -  Some recent data might be hidden

## 2021-04-07 DIAGNOSIS — E875 Hyperkalemia: Secondary | ICD-10-CM

## 2021-04-07 LAB — CBC
HCT: 31.6 % — ABNORMAL LOW (ref 39.0–52.0)
Hemoglobin: 10.3 g/dL — ABNORMAL LOW (ref 13.0–17.0)
MCH: 31.4 pg (ref 26.0–34.0)
MCHC: 32.6 g/dL (ref 30.0–36.0)
MCV: 96.3 fL (ref 80.0–100.0)
Platelets: 209 10*3/uL (ref 150–400)
RBC: 3.28 MIL/uL — ABNORMAL LOW (ref 4.22–5.81)
RDW: 16.3 % — ABNORMAL HIGH (ref 11.5–15.5)
WBC: 5.5 10*3/uL (ref 4.0–10.5)
nRBC: 0 % (ref 0.0–0.2)

## 2021-04-07 LAB — CULTURE, BLOOD (ROUTINE X 2)
Culture: NO GROWTH
Culture: NO GROWTH
Special Requests: ADEQUATE

## 2021-04-07 LAB — BASIC METABOLIC PANEL
Anion gap: 16 — ABNORMAL HIGH (ref 5–15)
BUN: 82 mg/dL — ABNORMAL HIGH (ref 6–20)
CO2: 21 mmol/L — ABNORMAL LOW (ref 22–32)
Calcium: 8.4 mg/dL — ABNORMAL LOW (ref 8.9–10.3)
Chloride: 93 mmol/L — ABNORMAL LOW (ref 98–111)
Creatinine, Ser: 7.1 mg/dL — ABNORMAL HIGH (ref 0.61–1.24)
GFR, Estimated: 8 mL/min — ABNORMAL LOW (ref >60)
Glucose, Bld: 155 mg/dL — ABNORMAL HIGH (ref 70–99)
Potassium: 6 mmol/L — ABNORMAL HIGH (ref 3.5–5.1)
Sodium: 130 mmol/L — ABNORMAL LOW (ref 135–145)

## 2021-04-07 LAB — GLUCOSE, CAPILLARY
Glucose-Capillary: 164 mg/dL — ABNORMAL HIGH (ref 70–99)
Glucose-Capillary: 157 mg/dL — ABNORMAL HIGH (ref 70–99)
Glucose-Capillary: 194 mg/dL — ABNORMAL HIGH (ref 70–99)

## 2021-04-07 LAB — AEROBIC CULTURE W GRAM STAIN (SUPERFICIAL SPECIMEN)

## 2021-04-07 NOTE — Progress Notes (Signed)
PT Cancellation Note  Patient Details Name: Richard Zavala MRN: [id number] DOB: June 02, 1961   Cancelled Treatment:    Reason Eval/Treat Not Completed: Other (comment);Medical issues which prohibited therapy. Consult received and chart reviewed. Patient noted with critically elevated K+ (6.0), per PT practice guidelines contraindicated for exertional activity at this time.  Will continue efforts next date pending medical stability and appropriateness.   Lieutenant Diego PT, DPT 8:16 AM,04/07/21

## 2021-04-07 NOTE — Progress Notes (Signed)
OT Cancellation Note  Patient Details Name: Richard Zavala MRN: [id number] DOB: [birth year]   Cancelled Treatment:    Reason Eval/Treat Not Completed: Medical issues which prohibited therapy  Thank you for OT consult.  Chart reviewed, pt currently with critically high potassium level (6.0).  As this is a contraindication for therapeutic activity, will follow up at next opportunity as pt is medically appropriate for OT evaluation.  Myrtie Hawk Coraleigh Sheeran, OTR/L 04/07/21, 8:27 AM

## 2021-04-07 NOTE — Progress Notes (Signed)
[place] INFECTIOUS DISEASE PROGRESS NOTE Date of Admission:  04/02/2021     ID: [place] FRIEDEN is a 60 y.o. male with LE wound and cellulitis Active Problems:   Cellulitis in diabetic foot ([place])   Subjective: No fevers, wbc nml. Lost IV access so changed to cipro and doxy. Cx with MSSA, rare, S to doxy and bactrim.  ROS  Eleven systems are reviewed and negative except per hpi  Medications:  Antibiotics Given (last 72 hours)    Date/Time Action Medication Dose Rate   04/04/21 1600 New Bag/Given   vancomycin (VANCOCIN) 750 mg in sodium chloride 0.9 % 250 mL IVPB 750 mg 250 mL/hr   04/06/21 1545 Given  [patient was sleeping from pain medication that had been given]   doxycycline (VIBRA-TABS) tablet 100 mg 100 mg    04/06/21 1727 Given   ciprofloxacin (CIPRO) tablet 500 mg 500 mg    04/06/21 2226 Given   doxycycline (VIBRA-TABS) tablet 100 mg 100 mg      . atorvastatin  80 mg Oral QHS  . carvedilol  12.5 mg Oral BID WC  . Chlorhexidine Gluconate Cloth  6 each Topical Q0600  . ciprofloxacin  500 mg Oral Daily  . doxycycline  100 mg Oral Q12H  . furosemide  80 mg Oral Q T,Th,S,Su  . heparin injection (subcutaneous)  5,000 Units Subcutaneous Q12H  . insulin aspart  0-15 Units Subcutaneous TID PC & HS  . lidocaine  1 patch Transdermal Daily  . pantoprazole  40 mg Oral BID AC  . sodium chloride flush  3 mL Intravenous Q12H    Objective: Vital signs in last 24 hours: Temp:  [97.3 F (36.3 C)-98.7 F (37.1 C)] 97.5 F (36.4 C) (04/18 0945) Pulse Rate:  [58-68] 68 (04/18 1245) Resp:  [13-20] 15 (04/18 1245) BP: (102-152)/(65-117) 135/78 (04/18 1245) SpO2:  [86 %-99 %] 99 % (04/18 1245) Constitutional: He is oriented to person, place, and time. He appears well-developed and well-nourished. No distress.  HENT: anicteric, L eye is cloudy Mouth/Throat: Oropharynx is clear and moist. No oropharyngeal exudate.  Cardiovascular: Normal rate, regular rhythm and normal heart  sounds. Pulmonary/Chest: Effort normal and breath sounds normal. No respiratory distress. He has no wheezes.  Abdominal: Soft. Bowel sounds are normal. He exhibits no distension. There is no tenderness.  Lymphadenopathy: He has no cervical adenopathy.  Neurological: He is alert and oriented to person, place, and time.  Diminished sensation RLE Skin: see photo. Multiple wounds. Surrounding erythema. Psychiatric: He has a normal mood and affect. His behavior is normal.  Diminished pulses RLE, poor cap refill    Lab Results Recent Labs    04/07/21 0525  WBC 5.5  HGB 10.3*  HCT 31.6*  NA 130*  K 6.0*  CL 93*  CO2 21*  BUN 82*  CREATININE 7.10*    Microbiology: Results for orders placed or performed during the hospital encounter of 04/02/21  Culture, blood (routine x 2)     Status: None   Collection Time: 04/02/21  6:01 PM   Specimen: Left Antecubital; Blood  Result Value Ref Range Status   Specimen Description LEFT ANTECUBITAL  Final   Special Requests   Final    BOTTLES DRAWN AEROBIC AND ANAEROBIC Blood Culture results may not be optimal due to an excessive volume of blood received in culture bottles   Culture   Final    NO GROWTH 5 DAYS Performed at [facility], [address] [place], [place]  [address]    Report Status 04/07/2021 FINAL  Final  Culture, blood (routine x 2)     Status: None   Collection Time: 04/02/21  8:15 PM   Specimen: BLOOD  Result Value Ref Range Status   Specimen Description BLOOD BLOOD LEFT HAND  Final   Special Requests   Final    BOTTLES DRAWN AEROBIC AND ANAEROBIC Blood Culture adequate volume   Culture   Final    NO GROWTH 5 DAYS Performed at [facility], [address], [place], [place] [address]    Report Status 04/07/2021 FINAL  Final  SARS CORONAVIRUS 2 (TAT 6-24 HRS) Nasopharyngeal Nasopharyngeal Swab     Status: None   Collection Time: 04/02/21  8:20 PM   Specimen: Nasopharyngeal Swab  Result Value Ref Range  Status   SARS Coronavirus 2 NEGATIVE NEGATIVE Final    Comment: (NOTE) SARS-CoV-2 target nucleic acids are NOT DETECTED.  The SARS-CoV-2 RNA is generally detectable in upper and lower respiratory specimens during the acute phase of infection. Negative results do not preclude SARS-CoV-2 infection, do not rule out co-infections with other pathogens, and should not be used as the sole basis for treatment or other patient management decisions. Negative results must be combined with clinical observations, patient history, and epidemiological information. The expected result is Negative.  Fact Sheet for Patients: SugarRoll.be  Fact Sheet for Healthcare Providers: https://www.woods-mathews.com/  This test is not yet approved or cleared by the [place] FDA and  has been authorized for detection and/or diagnosis of SARS-CoV-2 by FDA under an Emergency Use Authorization (EUA). This EUA will remain  in effect (meaning this test can be used) for the duration of the COVID-19 declaration under Se ction 564(b)(1) of the Act, 21 U.S.C. section 360bbb-3(b)(1), unless the authorization is terminated or revoked sooner.  Performed at [facility], [place] [address] [place], [place] [address]   Aerobic Culture w Gram Stain (superficial specimen)     Status: None   Collection Time: 04/03/21  2:34 PM   Specimen: Wound  Result Value Ref Range Status   Specimen Description   Final    WOUND Performed at [facility], [address] [place], [place] [address]    Special Requests   Final    LEFT FOOT Performed at [facility], [place], [place] [address]    Gram Stain   Final    RARE WBC PRESENT,BOTH PMN AND MONONUCLEAR RARE GRAM POSITIVE RODS    Culture   Final    RARE STAPHYLOCOCCUS AUREUS WITHIN MIXED ORGANISMS Performed at [facility], [place] [address] [place], [place] [address]    Report Status  04/07/2021 FINAL  Final   Organism ID, Bacteria STAPHYLOCOCCUS AUREUS  Final      Susceptibility   Staphylococcus aureus - MIC*    CIPROFLOXACIN <=0.5 SENSITIVE Sensitive     ERYTHROMYCIN >=8 RESISTANT Resistant     GENTAMICIN <=0.5 SENSITIVE Sensitive     OXACILLIN <=0.25 SENSITIVE Sensitive     TETRACYCLINE <=1 SENSITIVE Sensitive     VANCOMYCIN 1 SENSITIVE Sensitive     TRIMETH/SULFA <=10 SENSITIVE Sensitive     CLINDAMYCIN RESISTANT Resistant     RIFAMPIN <=0.5 SENSITIVE Sensitive     Inducible Clindamycin POSITIVE Resistant     * RARE STAPHYLOCOCCUS AUREUS    Studies/Results: No results found.  Assessment/Plan: COBEN GODSHALL is a 60 y.o. male with ESRD, DM complicated by PN and multiple other  medical issues (CHF, coronary artery disease, hypertension, dyslipidemia, peripheral neuropathy, CVA and BPH) now with nonhealing ulcers of RLE. On exam he has diminished DP and TP pulses, cool foot and poor cap refill. Suspect PAD with overlying cellulitis  Recommendations Cont doxy.  PCN allergic. DC cipro. Vascular wu pending Thank you very much for the consult. Will follow with you.  Leonel Ramsay   04/07/2021, 2:06 PM

## 2021-04-07 NOTE — Progress Notes (Signed)
PROGRESS NOTE    Richard Zavala  CWC:[id number] DOB: 01-06-1961 DOA: 04/02/2021 PCP: Midge Minium, PA    Brief Narrative:  Richard Zavala is a 60 y.o. Caucasian male with medical history significant for multiple medical problems that are mentioned below including CHF, coronary artery disease, end-stage renal disease on hemodialysis, hypertension, dyslipidemia, peripheral neuropathy, CVA and BPH, who presented to the ER with acute onset of worsening right foot swelling with erythema and mild extension to the right leg for last 4 days.  The patient had ORIF of the left leg during recent admission here and was discharged on 03/25/2021.  He has a right foot ulcer and scattered healed ulcers over his foot and leg.  He denied any fever or chills.  He has no problems currently from his left leg.  Earlier today he finished his session of hemodialysis that he gets on Monday, Wednesday and Fridays.  No chest pain or palpitations.  No reported cough or wheezing or dyspnea.  ED Course: Upon presentation to the ER, temperature was 100 with a blood pressure 137/99 with otherwise normal vital signs.  Labs revealed blood glucose of 216 and a BUN of 39 with creatinine 4.82 with anion gap of 13.  Albumin was 2.6.  Lactic acid was 1.1.  CBC showed anemia close to his baseline.  Blood culture was drawn.  4/14- no overnight issues. Saw pt this am. In the after noon nsg stated pts bp 81/68, repeat with manual 153/71.  Was planning to give bolus and midodrine, but d/c'd since repeat with manual was stable.  4/15-found with two chocolate bars son brought to him. Refused insulin. Discussed good glycemic control, but appears not to care what I am trying to discuss about it  4/16-sunkist regular at bedside. Pt c/o why HD did not take a lot of fluid from him, he wanted more fluid taken out but they refused. No other issues  4/17 c/o back spasm. Lots iv line multiple times.  4/18 seen in hemodialysis.  Tolerating.   Complaining that he wants more fluid to be taken off.  Consultants:   ID, vascular surgery , nephrology                               Procedures:  Antimicrobials:   Ciprofloxacin and doxycycline   Subjective: No chest pain, shortness of breath, foot pain or abdominal pain  Objective: Vitals:   04/06/21 1652 04/06/21 2029 04/07/21 0600 04/07/21 0802  BP: 135/83 (!) 146/82 137/87 102/65  Pulse: 65 61 (!) 58 61  Resp: 18 20 20 20   Temp: 98.7 F (37.1 C) (!) 97.4 F (36.3 C) 97.6 F (36.4 C) (!) 97.3 F (36.3 C)  TempSrc:  Oral  Oral  SpO2:  99% 96% 95%  Weight:      Height:        Intake/Output Summary (Last 24 hours) at 04/07/2021 0834 Last data filed at 04/06/2021 1346 Gross per 24 hour  Intake 480 ml  Output --  Net 480 ml   Filed Weights   04/02/21 1755 04/03/21 0130  Weight: 71 kg 73.9 kg    Examination: In hemodialysis, NAD CTA no wheeze rales rhonchi's Regular S1-S2 no gallops Soft benign positive bowel sounds Right lower extremity with mild edema, less erythema left lower extremity wrapped Awake alert x3 grossly intact  Data Reviewed: I have personally reviewed following labs and imaging studies  CBC: Recent Labs  Lab 04/02/21 1801 04/03/21 0543 04/07/21 0525  WBC 6.6 5.9 5.5  NEUTROABS 4.9  --   --   HGB 9.4* 9.4* 10.3*  HCT 29.5* 29.6* 31.6*  MCV 98.0 98.7 96.3  PLT 234 211 132   Basic Metabolic Panel: Recent Labs  Lab 04/02/21 1801 04/03/21 0543 04/07/21 0525  NA 135 138 130*  K 4.4 4.2 6.0*  CL 99 101 93*  CO2 23 25 21*  GLUCOSE 216* 55* 155*  BUN 39* 49* 82*  CREATININE 4.82* 5.30* 7.10*  CALCIUM 8.3* 8.4* 8.4*   GFR: Estimated Creatinine Clearance: 11 mL/min (A) (by C-G formula based on SCr of 7.1 mg/dL (H)). Liver Function Tests: Recent Labs  Lab 04/02/21 1801  AST 19  ALT 16  ALKPHOS 119  BILITOT 0.8  PROT 6.3*  ALBUMIN 2.6*   No results for input(s): LIPASE, AMYLASE in the last 168 hours. No results for  input(s): AMMONIA in the last 168 hours. Coagulation Profile: Recent Labs  Lab 04/03/21 0543  INR 1.4*   Cardiac Enzymes: No results for input(s): CKTOTAL, CKMB, CKMBINDEX, TROPONINI in the last 168 hours. BNP (last 3 results) No results for input(s): PROBNP in the last 8760 hours. HbA1C: No results for input(s): HGBA1C in the last 72 hours. CBG: Recent Labs  Lab 04/06/21 0728 04/06/21 1202 04/06/21 1650 04/06/21 2052 04/07/21 0746  GLUCAP 110* 169* 199* 111* 164*   Lipid Profile: No results for input(s): CHOL, HDL, LDLCALC, TRIG, CHOLHDL, LDLDIRECT in the last 72 hours. Thyroid Function Tests: No results for input(s): TSH, T4TOTAL, FREET4, T3FREE, THYROIDAB in the last 72 hours. Anemia Panel: No results for input(s): VITAMINB12, FOLATE, FERRITIN, TIBC, IRON, RETICCTPCT in the last 72 hours. Sepsis Labs: Recent Labs  Lab 04/02/21 1801  LATICACIDVEN 1.1    Recent Results (from the past 240 hour(s))  Culture, blood (routine x 2)     Status: None   Collection Time: 04/02/21  6:01 PM   Specimen: Left Antecubital; Blood  Result Value Ref Range Status   Specimen Description LEFT ANTECUBITAL  Final   Special Requests   Final    BOTTLES DRAWN AEROBIC AND ANAEROBIC Blood Culture results may not be optimal due to an excessive volume of blood received in culture bottles   Culture   Final    NO GROWTH 5 DAYS Performed at [facility], [address] [place], [place] [address]    Report Status 04/07/2021 FINAL  Final  Culture, blood (routine x 2)     Status: None   Collection Time: 04/02/21  8:15 PM   Specimen: BLOOD  Result Value Ref Range Status   Specimen Description BLOOD BLOOD LEFT HAND  Final   Special Requests   Final    BOTTLES DRAWN AEROBIC AND ANAEROBIC Blood Culture adequate volume   Culture   Final    NO GROWTH 5 DAYS Performed at [facility], [address], [place], [place] [address]    Report Status 04/07/2021 FINAL  Final  SARS  CORONAVIRUS 2 (TAT 6-24 HRS) Nasopharyngeal Nasopharyngeal Swab     Status: None   Collection Time: 04/02/21  8:20 PM   Specimen: Nasopharyngeal Swab  Result Value Ref Range Status   SARS Coronavirus 2 NEGATIVE NEGATIVE Final    Comment: (NOTE) SARS-CoV-2 target nucleic acids are NOT DETECTED.  The SARS-CoV-2 RNA is generally detectable in upper and lower respiratory specimens during the acute phase of infection. Negative results do not preclude SARS-CoV-2 infection, do not rule  out co-infections with other pathogens, and should not be used as the sole basis for treatment or other patient management decisions. Negative results must be combined with clinical observations, patient history, and epidemiological information. The expected result is Negative.  Fact Sheet for Patients: SugarRoll.be  Fact Sheet for Healthcare Providers: https://www.woods-mathews.com/  This test is not yet approved or cleared by the [place] FDA and  has been authorized for detection and/or diagnosis of SARS-CoV-2 by FDA under an Emergency Use Authorization (EUA). This EUA will remain  in effect (meaning this test can be used) for the duration of the COVID-19 declaration under Se ction 564(b)(1) of the Act, 21 U.S.C. section 360bbb-3(b)(1), unless the authorization is terminated or revoked sooner.  Performed at [facility], [place] [address], [place], [place] [address]   Aerobic Culture w Gram Stain (superficial specimen)     Status: None (Preliminary result)   Collection Time: 04/03/21  2:34 PM   Specimen: Wound  Result Value Ref Range Status   Specimen Description   Final    WOUND Performed at [facility], [address], [place], [place] [address]    Special Requests   Final    LEFT FOOT Performed at [facility], [address] [place], [place] [address]    Gram Stain   Final    RARE WBC PRESENT,BOTH PMN AND  MONONUCLEAR RARE GRAM POSITIVE RODS    Culture   Final    RARE STAPHYLOCOCCUS AUREUS SUSCEPTIBILITIES TO FOLLOW Performed at [facility], [place] [address], [place], [place] [address]    Report Status PENDING  Incomplete         Radiology Studies: No results found.      Scheduled Meds: . atorvastatin  80 mg Oral QHS  . carvedilol  12.5 mg Oral BID WC  . Chlorhexidine Gluconate Cloth  6 each Topical Q0600  . ciprofloxacin  500 mg Oral Daily  . doxycycline  100 mg Oral Q12H  . furosemide  80 mg Oral Q T,Th,S,Su  . heparin injection (subcutaneous)  5,000 Units Subcutaneous Q12H  . insulin aspart  0-15 Units Subcutaneous TID PC & HS  . lidocaine  1 patch Transdermal Daily  . pantoprazole  40 mg Oral BID AC  . sodium chloride flush  3 mL Intravenous Q12H   Continuous Infusions: . sodium chloride      Assessment & Plan:   Active Problems:   Cellulitis in diabetic foot ([place])   1.  Diabetic right foot moderate nonpurulent cellulitis secondary to infected ulcer in the setting of immunosuppression with diabetes mellitus. IDs input was appreciated. Cultures done on largest wound to rule out resistant organism Continue Vanco and cefepime Continue on ivabx with cefepime and vancomycin Since difficult to assess pt's peripheral pulses /pedal , foot cooler to touch, will consult vascular surgery, input appreciated-we will obtain ABIs initially 4/16-vascular following-he may require angiography with intervention to improve his circulation to allow for wound healing per vascular  Podiatry consulted input was appreciated  X-ray negative on the right foot as far as osteomyelitis. Podiatry recommends IV antibiotics to be continue to monitor the wound.  Consider OR debridement however await arterial studies and vascular's input prior to surgery if needed for debridement. 4/17-discussed with ID Dr. Ola Spurr via chat about po abx option as pt has lost iv site multiple times since  yesterday. Recommended ciprofloxacin and doxycycline. 4/18 continue with cipro and doxyc. Will f/u with ID for any new rec.  2.  Type II diabetes mellitus with mild hyperglycemia. Patient refusing insulin 4/16 noncompliant with diet Discussed glycemic control with the patient for better wound healing Only on R ISS as he was refusing Lantus 4/18-bg stable riss    3.  End-stage renal disease on hemodialysis. Hyperkalemia K 6.0 In HD today Nephrology following On lasix 80mg  on non HD days epogen as outpt      4.  Dyslipidemia. Continue statin  5.  Essential hypertension. Resume Coreg and Norvasc   6.  GERD. - continue PPI therapy.  7.  Coronary artery disease. -We will continue beta-blocker therapy, Imdur and aspirin as well as statin therapy.  PT/OT  DVT prophylaxis: heparin Code Status:full Family Communication: none at bedside  Status is: Inpatient  The patient remains inpatient due to severity of illness and requiring IV treatment.  Dispo: The patient is from:home              Anticipated d/c is to: TBD/home              Patient currently not medically stable   Difficult to place patient:no            LOS: 5 days   Time spent: 35 min with >50% on coc    Nolberto Hanlon, MD [facility] Pager [phone]  If 7PM-7AM, please contact night-coverage 04/07/2021, 8:34 AM

## 2021-04-07 NOTE — Progress Notes (Signed)
[facility]  ROUNDING NOTE   Subjective:    Seen and examined on hemodialysis treatment. Tolerating treatment well. Laying in bed.    Objective:  Vital signs in last 24 hours:  Temp:  [97.3 F (36.3 C)-98.7 F (37.1 C)] 97.5 F (36.4 C) (04/18 0945) Pulse Rate:  [58-68] 68 (04/18 1245) Resp:  [13-20] 15 (04/18 1245) BP: (102-152)/(65-117) 135/78 (04/18 1245) SpO2:  [86 %-99 %] 99 % (04/18 1245)  Weight change:  Filed Weights   04/02/21 1755 04/03/21 0130  Weight: 71 kg 73.9 kg    Intake/Output: I/O last 3 completed shifts: In: 480 [P.O.:480] Out: -    Intake/Output this shift:  Total I/O In: -  Out: 1500 [Other:1500]  Physical Exam: General: NAD, sitting up in bed   Head: Normocephalic, atraumatic. Moist oral mucosal membranes  Eyes: Anicteric, PERRL  Neck: Supple, trachea midline  Lungs:  Clear to auscultation  Heart: Regular rate and rhythm  Abdomen:  Soft, nontender, non distended.   Extremities:  trace peripheral edema in right ankle, right foot currently dressed, left leg wrapped in dressing.   Neurologic: Nonfocal, moving all four extremities  Skin: Ulcerations to the right foot  Access: Right UE AVF     Basic Metabolic Panel: Recent Labs  Lab 04/02/21 1801 04/03/21 0543 04/07/21 0525  NA 135 138 130*  K 4.4 4.2 6.0*  CL 99 101 93*  CO2 23 25 21*  GLUCOSE 216* 55* 155*  BUN 39* 49* 82*  CREATININE 4.82* 5.30* 7.10*  CALCIUM 8.3* 8.4* 8.4*    Liver Function Tests: Recent Labs  Lab 04/02/21 1801  AST 19  ALT 16  ALKPHOS 119  BILITOT 0.8  PROT 6.3*  ALBUMIN 2.6*   No results for input(s): LIPASE, AMYLASE in the last 168 hours. No results for input(s): AMMONIA in the last 168 hours.  CBC: Recent Labs  Lab 04/02/21 1801 04/03/21 0543 04/07/21 0525  WBC 6.6 5.9 5.5  NEUTROABS 4.9  --   --   HGB 9.4* 9.4* 10.3*  HCT 29.5* 29.6* 31.6*  MCV 98.0 98.7 96.3  PLT 234 211 209    Cardiac Enzymes: No results for  input(s): CKTOTAL, CKMB, CKMBINDEX, TROPONINI in the last 168 hours.  BNP: Invalid input(s): POCBNP  CBG: Recent Labs  Lab 04/06/21 0728 04/06/21 1202 04/06/21 1650 04/06/21 2052 04/07/21 0746  GLUCAP 110* 169* 199* 111* 164*    Microbiology: Results for orders placed or performed during the hospital encounter of 04/02/21  Culture, blood (routine x 2)     Status: None   Collection Time: 04/02/21  6:01 PM   Specimen: Left Antecubital; Blood  Result Value Ref Range Status   Specimen Description LEFT ANTECUBITAL  Final   Special Requests   Final    BOTTLES DRAWN AEROBIC AND ANAEROBIC Blood Culture results may not be optimal due to an excessive volume of blood received in culture bottles   Culture   Final    NO GROWTH 5 DAYS Performed at [facility], [address], [place], [place] [address]    Report Status 04/07/2021 FINAL  Final  Culture, blood (routine x 2)     Status: None   Collection Time: 04/02/21  8:15 PM   Specimen: BLOOD  Result Value Ref Range Status   Specimen Description BLOOD BLOOD LEFT HAND  Final   Special Requests   Final    BOTTLES DRAWN AEROBIC AND ANAEROBIC Blood Culture adequate volume   Culture  Final    NO GROWTH 5 DAYS Performed at [facility], [address] [place], [place] [address]    Report Status 04/07/2021 FINAL  Final  SARS CORONAVIRUS 2 (TAT 6-24 HRS) Nasopharyngeal Nasopharyngeal Swab     Status: None   Collection Time: 04/02/21  8:20 PM   Specimen: Nasopharyngeal Swab  Result Value Ref Range Status   SARS Coronavirus 2 NEGATIVE NEGATIVE Final    Comment: (NOTE) SARS-CoV-2 target nucleic acids are NOT DETECTED.  The SARS-CoV-2 RNA is generally detectable in upper and lower respiratory specimens during the acute phase of infection. Negative results do not preclude SARS-CoV-2 infection, do not rule out co-infections with other pathogens, and should not be used as the sole basis for treatment or other patient  management decisions. Negative results must be combined with clinical observations, patient history, and epidemiological information. The expected result is Negative.  Fact Sheet for Patients: SugarRoll.be  Fact Sheet for Healthcare Providers: https://www.woods-mathews.com/  This test is not yet approved or cleared by the [place] FDA and  has been authorized for detection and/or diagnosis of SARS-CoV-2 by FDA under an Emergency Use Authorization (EUA). This EUA will remain  in effect (meaning this test can be used) for the duration of the COVID-19 declaration under Se ction 564(b)(1) of the Act, 21 U.S.C. section 360bbb-3(b)(1), unless the authorization is terminated or revoked sooner.  Performed at [facility], [place] [address], [place], [place] [address]   Aerobic Culture w Gram Stain (superficial specimen)     Status: None   Collection Time: 04/03/21  2:34 PM   Specimen: Wound  Result Value Ref Range Status   Specimen Description   Final    WOUND Performed at [facility], [address], [place], [place] [address]    Special Requests   Final    LEFT FOOT Performed at [facility], [place], [place] [address]    Gram Stain   Final    RARE WBC PRESENT,BOTH PMN AND MONONUCLEAR RARE GRAM POSITIVE RODS    Culture   Final    RARE STAPHYLOCOCCUS AUREUS WITHIN MIXED ORGANISMS Performed at [facility], [place] [address] [place], [place] [address]    Report Status 04/07/2021 FINAL  Final   Organism ID, Bacteria STAPHYLOCOCCUS AUREUS  Final      Susceptibility   Staphylococcus aureus - MIC*    CIPROFLOXACIN <=0.5 SENSITIVE Sensitive     ERYTHROMYCIN >=8 RESISTANT Resistant     GENTAMICIN <=0.5 SENSITIVE Sensitive     OXACILLIN <=0.25 SENSITIVE Sensitive     TETRACYCLINE <=1 SENSITIVE Sensitive     VANCOMYCIN 1 SENSITIVE Sensitive     TRIMETH/SULFA <=10 SENSITIVE Sensitive      CLINDAMYCIN RESISTANT Resistant     RIFAMPIN <=0.5 SENSITIVE Sensitive     Inducible Clindamycin POSITIVE Resistant     * RARE STAPHYLOCOCCUS AUREUS    Coagulation Studies: No results for input(s): LABPROT, INR in the last 72 hours.  Urinalysis: No results for input(s): COLORURINE, LABSPEC, PHURINE, GLUCOSEU, HGBUR, BILIRUBINUR, KETONESUR, PROTEINUR, UROBILINOGEN, NITRITE, LEUKOCYTESUR in the last 72 hours.  Invalid input(s): APPERANCEUR    Imaging: No results found.   Medications:   . sodium chloride     . atorvastatin  80 mg Oral QHS  . carvedilol  12.5 mg Oral BID WC  . Chlorhexidine Gluconate Cloth  6 each Topical Q0600  . ciprofloxacin  500 mg Oral Daily  . doxycycline  100  mg Oral Q12H  . furosemide  80 mg Oral Q T,Th,S,Su  . heparin injection (subcutaneous)  5,000 Units Subcutaneous Q12H  . insulin aspart  0-15 Units Subcutaneous TID PC & HS  . lidocaine  1 patch Transdermal Daily  . pantoprazole  40 mg Oral BID AC  . sodium chloride flush  3 mL Intravenous Q12H   sodium chloride, acetaminophen **OR** acetaminophen, albuterol, cyclobenzaprine, docusate sodium, magnesium hydroxide, morphine injection, ondansetron **OR** ondansetron (ZOFRAN) IV, oxyCODONE, sodium chloride flush, traZODone  Assessment/ Plan:  Richard Zavala is a 60 y.o.  male with past medical history of ESRD on hemodialysis, anemia, diabetes, CAD, CHF, pulmonary hypertension and osteoporosis. He presents to the ED with leg swelling found to be cellulitis.   [facility] MWF  Rt lower AVF 72kg  1. ESRD on HD Seen and examined on hemodialysis treatment. Tolerating treatment well.   2. Anemia of CKD : hemoglobin 10.3 - EPO as outpatient.   3. Secondary hyperparathyroidism of renal origin  Not currently on a binder.   4. Hypertension:  - continue carvedilol and furosemide.    LOS: 5 Darrien Laakso 4/18/20222:01 PM

## 2021-04-08 ENCOUNTER — Inpatient Hospital Stay
Admission: EM | Disposition: A | Discharge status: Skilled Nursing Facility | Payer: Self-pay | Source: Home / Self Care | Attending: Internal Medicine

## 2021-04-08 ENCOUNTER — Encounter: Payer: Medicare Other | Admitting: Physical Therapy

## 2021-04-08 ENCOUNTER — Other Ambulatory Visit (INDEPENDENT_AMBULATORY_CARE_PROVIDER_SITE_OTHER): Payer: Self-pay | Admitting: Vascular Surgery

## 2021-04-08 DIAGNOSIS — I70235 Atherosclerosis of native arteries of right leg with ulceration of other part of foot: Secondary | ICD-10-CM

## 2021-04-08 DIAGNOSIS — I70202 Unspecified atherosclerosis of native arteries of extremities, left leg: Secondary | ICD-10-CM

## 2021-04-08 HISTORY — PX: LOWER EXTREMITY ANGIOGRAPHY: CATH118251

## 2021-04-08 LAB — GLUCOSE, CAPILLARY
Glucose-Capillary: 209 mg/dL — ABNORMAL HIGH (ref 70–99)
Glucose-Capillary: 181 mg/dL — ABNORMAL HIGH (ref 70–99)
Glucose-Capillary: 164 mg/dL — ABNORMAL HIGH (ref 70–99)
Glucose-Capillary: 151 mg/dL — ABNORMAL HIGH (ref 70–99)
Glucose-Capillary: 217 mg/dL — ABNORMAL HIGH (ref 70–99)

## 2021-04-08 LAB — POTASSIUM: Potassium: 5.3 mmol/L — ABNORMAL HIGH (ref 3.5–5.1)

## 2021-04-08 SURGERY — LOWER EXTREMITY ANGIOGRAPHY
Anesthesia: Moderate Sedation | Laterality: Right

## 2021-04-08 MED ORDER — ONDANSETRON HCL 4 MG/2ML IJ SOLN: 4.0000 mg | Freq: Four times a day (QID) | INTRAMUSCULAR | Status: DC | PRN

## 2021-04-08 MED ORDER — CLINDAMYCIN PHOSPHATE 300 MG/50ML IV SOLN: 300.0000 mg | Freq: Once | INTRAVENOUS | Status: AC

## 2021-04-08 MED ORDER — CLOPIDOGREL BISULFATE 300 MG PO TABS
300.0000 mg | ORAL_TABLET | ORAL | Status: DC
  Filled 2021-04-08: qty 1

## 2021-04-08 MED ORDER — METHYLPREDNISOLONE SODIUM SUCC 125 MG IJ SOLR: 125.0000 mg | Freq: Once | INTRAMUSCULAR | Status: DC | PRN

## 2021-04-08 MED ORDER — HYDRALAZINE HCL 20 MG/ML IJ SOLN: 5.0000 mg | INTRAMUSCULAR | Status: DC | PRN

## 2021-04-08 MED ORDER — FENTANYL CITRATE (PF) 100 MCG/2ML IJ SOLN
INTRAMUSCULAR | Status: AC
  Filled 2021-04-08: qty 2

## 2021-04-08 MED ORDER — FAMOTIDINE 20 MG PO TABS: 40.0000 mg | ORAL_TABLET | Freq: Once | ORAL | Status: DC | PRN

## 2021-04-08 MED ORDER — CLOPIDOGREL BISULFATE 75 MG PO TABS
75.0000 mg | ORAL_TABLET | Freq: Every day | ORAL | Status: DC
  Administered 2021-04-09 – 2021-04-10 (×2): 75 mg via ORAL
  Filled 2021-04-08 (×2): qty 1

## 2021-04-08 MED ORDER — OXYCODONE HCL 5 MG PO TABS: 5.0000 mg | ORAL_TABLET | ORAL | Status: DC | PRN

## 2021-04-08 MED ORDER — MIDAZOLAM HCL 5 MG/5ML IJ SOLN
INTRAMUSCULAR | Status: AC
  Filled 2021-04-08: qty 5

## 2021-04-08 MED ORDER — SODIUM CHLORIDE 0.9% FLUSH
3.0000 mL | Freq: Two times a day (BID) | INTRAVENOUS | Status: DC
  Administered 2021-04-08: 3 mL via INTRAVENOUS

## 2021-04-08 MED ORDER — HYDROMORPHONE HCL 1 MG/ML IJ SOLN
1.0000 mg | Freq: Once | INTRAMUSCULAR | Status: DC | PRN
Start: 2021-04-08 — End: 2021-04-10

## 2021-04-08 MED ORDER — CLOPIDOGREL BISULFATE 75 MG PO TABS
300.0000 mg | ORAL_TABLET | ORAL | Status: AC
  Administered 2021-04-08: 300 mg via ORAL
  Filled 2021-04-08: qty 4

## 2021-04-08 MED ORDER — MIDAZOLAM HCL 2 MG/2ML IJ SOLN
INTRAMUSCULAR | Status: DC | PRN
  Administered 2021-04-08: 2 mg via INTRAVENOUS

## 2021-04-08 MED ORDER — ASPIRIN EC 81 MG PO TBEC
81.0000 mg | DELAYED_RELEASE_TABLET | Freq: Every day | ORAL | Status: DC
  Administered 2021-04-09 – 2021-04-10 (×2): 81 mg via ORAL
  Filled 2021-04-08 (×2): qty 1

## 2021-04-08 MED ORDER — ASPIRIN 325 MG PO TABS
325.0000 mg | ORAL_TABLET | ORAL | Status: AC
  Administered 2021-04-08: 325 mg via ORAL
  Filled 2021-04-08: qty 1

## 2021-04-08 MED ORDER — CLINDAMYCIN PHOSPHATE 300 MG/50ML IV SOLN
INTRAVENOUS | Status: AC
  Administered 2021-04-08: 300 mg via INTRAVENOUS
  Filled 2021-04-08: qty 50

## 2021-04-08 MED ORDER — HYDRALAZINE HCL 20 MG/ML IJ SOLN
INTRAMUSCULAR | Status: AC
  Filled 2021-04-08: qty 1

## 2021-04-08 MED ORDER — SODIUM CHLORIDE 0.9 % IV SOLN: 250.0000 mL | INTRAVENOUS | Status: DC | PRN

## 2021-04-08 MED ORDER — HYDRALAZINE HCL 20 MG/ML IJ SOLN
INTRAMUSCULAR | Status: DC | PRN
  Administered 2021-04-08 (×2): 10 mg via INTRAVENOUS

## 2021-04-08 MED ORDER — ACETAMINOPHEN 325 MG PO TABS
650.0000 mg | ORAL_TABLET | ORAL | Status: DC | PRN
  Filled 2021-04-08: qty 2

## 2021-04-08 MED ORDER — MIDAZOLAM HCL 2 MG/ML PO SYRP: 8.0000 mg | ORAL_SOLUTION | Freq: Once | ORAL | Status: DC | PRN

## 2021-04-08 MED ORDER — DIPHENHYDRAMINE HCL 50 MG/ML IJ SOLN: 50.0000 mg | Freq: Once | INTRAMUSCULAR | Status: DC | PRN

## 2021-04-08 MED ORDER — HEPARIN SODIUM (PORCINE) 1000 UNIT/ML IJ SOLN
INTRAMUSCULAR | Status: DC | PRN
Start: 2021-04-08 — End: 2021-04-08
  Administered 2021-04-08: 5000 [IU] via INTRAVENOUS

## 2021-04-08 MED ORDER — HEPARIN SODIUM (PORCINE) 1000 UNIT/ML IJ SOLN
INTRAMUSCULAR | Status: AC
  Filled 2021-04-08: qty 1

## 2021-04-08 MED ORDER — SODIUM CHLORIDE 0.9 % IV SOLN: INTRAVENOUS | Status: DC

## 2021-04-08 MED ORDER — MORPHINE SULFATE (PF) 4 MG/ML IV SOLN: 2.0000 mg | INTRAVENOUS | Status: DC | PRN

## 2021-04-08 MED ORDER — LABETALOL HCL 5 MG/ML IV SOLN
10.0000 mg | INTRAVENOUS | Status: DC | PRN
  Administered 2021-04-10: 10 mg via INTRAVENOUS
  Filled 2021-04-08: qty 4

## 2021-04-08 MED ORDER — FENTANYL CITRATE (PF) 100 MCG/2ML IJ SOLN
INTRAMUSCULAR | Status: DC | PRN
  Administered 2021-04-08 (×2): 50 ug via INTRAVENOUS

## 2021-04-08 MED ORDER — SODIUM CHLORIDE 0.9% FLUSH: 3.0000 mL | INTRAVENOUS | Status: DC | PRN

## 2021-04-08 SURGICAL SUPPLY — 35 items
BALLN LUTONIX 5X150X130 (BALLOONS) ×2
BALLN LUTONIX 6X120X130 (BALLOONS) ×2
BALLN LUTONIX AV 8X40X75 (BALLOONS) ×2
BALLN LUTONIX AV 9X40X75 (BALLOONS) ×2
BALLN LUTONIX DCB 6X60X130 (BALLOONS) ×2
BALLN MUSTANG 7.0X40 75 (BALLOONS) ×2
BALLN ULTRASCORE 4X100X130 (BALLOONS) ×2
BALLN ULTRVRSE 3X40X130C (BALLOONS) ×2
BALLOON LUTONIX 5X150X130 (BALLOONS) ×1 IMPLANT
BALLOON LUTONIX 6X120X130 (BALLOONS) ×1 IMPLANT
BALLOON LUTONIX AV 8X40X75 (BALLOONS) ×1 IMPLANT
BALLOON LUTONIX AV 9X40X75 (BALLOONS) ×1 IMPLANT
BALLOON LUTONIX DCB 6X60X130 (BALLOONS) ×1 IMPLANT
BALLOON MUSTANG 7.0X40 75 (BALLOONS) ×1 IMPLANT
BALLOON ULTRASCORE 4X100X130 (BALLOONS) ×1 IMPLANT
BALLOON ULTRVRSE 3X40X130C (BALLOONS) ×1 IMPLANT
CANNULA 5F STIFF (CANNULA) ×2 IMPLANT
CATH ANGIO 5F PIGTAIL 65CM (CATHETERS) ×2 IMPLANT
CATH KUMPE SOFT-VU 5FR 65 (CATHETERS) ×2 IMPLANT
CATH TEMPO 5F RIM 65CM (CATHETERS) ×2 IMPLANT
COVER PROBE U/S 5X48 (MISCELLANEOUS) ×2 IMPLANT
DEVICE SAFEGUARD 24CM (GAUZE/BANDAGES/DRESSINGS) ×4 IMPLANT
DEVICE STARCLOSE SE CLOSURE (Vascular Products) ×4 IMPLANT
GLIDEWIRE ADV .035X260CM (WIRE) ×2 IMPLANT
INTRODUCER 7FR 23CM (INTRODUCER) ×4 IMPLANT
KIT ENCORE 26 ADVANTAGE (KITS) ×4 IMPLANT
PACK ANGIOGRAPHY (CUSTOM PROCEDURE TRAY) ×2 IMPLANT
SHEATH ANL2 6FRX45 HC (SHEATH) ×2 IMPLANT
SHEATH BRITE TIP 5FRX11 (SHEATH) ×2 IMPLANT
SHEATH SHUTTLE SELECT 6F (SHEATH) ×2 IMPLANT
STENT LIFESTREAM 6X37X80 (Permanent Stent) ×2 IMPLANT
STENT LIFESTREAM 7X37X80 (Permanent Stent) ×2 IMPLANT
TUBING CONTRAST HIGH PRESS 72 (TUBING) ×2 IMPLANT
WIRE GUIDERIGHT .035X150 (WIRE) ×2 IMPLANT
WIRE MAGIC TORQUE 260C (WIRE) ×2 IMPLANT

## 2021-04-08 NOTE — TOC Progression Note (Addendum)
Transition of Care [facility]) - Progression Note    Patient Details  Name: Richard Zavala MRN: [id number] Date of Birth: 03-Jul-1961  Transition [facility]) CM/SW Contact  Beverly Sessions, RN Phone Number: 04/08/2021, 3:11 PM  Clinical Narrative:     Patient off the floor for angiogram Patient will need insurance auth for SNF. Updated clinical will be needed for auth to be obtained.  OT has seen patient and recommends SNF, PT still pending   Accepted bed at [facility]  Expected Discharge Plan: [place] Barriers to Discharge: Continued Medical Work up  Expected Discharge Plan and Services Expected Discharge Plan: [place] arrangements for the past 2 months: Single Family Home                                       Social Determinants of Health (SDOH) Interventions    Readmission Risk Interventions Readmission Risk Prevention Plan 04/06/2021 02/14/2021 02/09/2021  Transportation Screening Complete Complete -  PCP or Specialist Appt within 3-5 Days - - -  [place]RI or [place] Work Consult for Long Neck Planning/Counseling - - Patient refused  Palliative Care Screening - - Patient Refused  Palliative Care Screening Not Complete Comments - - (No Data)  Medication Review Press photographer) Complete Complete Referral to Pharmacy  PCP or Specialist appointment within 3-5 days of discharge Complete Complete -  [place] or Home Care Consult Complete Patient refused -  SW Recovery Care/Counseling Consult Complete Patient refused -  Palliative Care Screening Complete Not Applicable -  [place] Complete Not Applicable -  Some recent data might be hidden

## 2021-04-08 NOTE — Op Note (Signed)
[facility]  Percutaneous Study/Intervention Procedural Note   Date of Surgery: 04/08/2021  Surgeon:Scout Gumbs, Dolores Lory   Pre-operative Diagnosis: Atherosclerotic occlusive disease bilateral lower extremities with multiple ulcerations of the right foot and fracture of the left ankle  Post-operative diagnosis:  Same  Procedure(s) Performed:  1.  Abdominal aortogram with right lower extremity distal runoff  2.  Percutaneous transluminal angioplasty of the distal SFA and popliteal artery to 6 mm with Lutonix drug-eluting balloon  3.  Percutaneous transluminal angioplasty of the anterior tibial artery to 3 mm within Ultraverse angioplasty balloon  4.  Percutaneous transluminal and plasty and stent placement left common iliac artery; "kissing balloon" technique  5.   Percutaneous transluminal angioplasty and stent placement right common iliac artery; "kissing balloon" technique  6.  Ultrasound guided access bilateral common femoral arteries             7.  StarClose closure device bilateral common femoral arteries  Anesthesia: Conscious sedation was administered under my direct supervision by the interventional radiology RN. IV Versed plus fentanyl were utilized. Continuous ECG, pulse oximetry and blood pressure was monitored throughout the entire procedure. Conscious sedation was for a total of 91 minutes.  Sheath: 7 French 23 cm sheaths right and left common femoral arteries retrograde; the distal right revascularization was performed through a 6 [place] Ansell sheath left common femoral retrograde  Contrast: 105 cc  Fluoroscopy Time: 14 minutes  Indications: Patient presented to the hospital with cellulitis and multiple ulcerations of the right lower extremity.  Noninvasive studies as well as physical examination are consistent with significant atherosclerotic occlusive disease.  Angiography with the hope for intervention for limb salvage was recommended and patient  has agreed to proceed.  Risks and benefits were reviewed all questions were answered we are proceeding with angiography and intervention.  Procedure:  Tristram Milian Perryis a 60 y.o. male who was identified and appropriate procedural time out was performed.  The patient was then placed supine on the table and prepped and draped in the usual sterile fashion.  Ultrasound was used to evaluate the left common femoral artery.  It was echolucent and pulsatile indicating it is patent .  An ultrasound image was acquired for the permanent record.  A micropuncture needle was used to access the left common femoral artery under direct ultrasound guidance.  The microwire was then advanced under fluoroscopic guidance without difficulty followed by the micro-sheath  A 0.035 J wire was advanced without resistance and a 5Fr sheath was placed.    The pigtail catheter was then positioned at the level of T12 and an AP image of the aorta was obtained. After review the images the pigtail catheter was repositioned above the aortic bifurcation and bilateral oblique views of the pelvis were obtained.  Rim catheter was then used with the advantage wire and the aortic bifurcation was crossed and the wire and catheter were negotiated first to the distal external where an RAO projection of the right groin was obtained and then the wire catheter negotiated into the SFA and distal runoff was obtained.  Diagnostic interpretation: The abdominal aorta is diffusely diseased but there are no hemodynamically significant stenoses however the extent of the plaque increases significantly at the distal aorta on the right there is a 70% stenosis of the common iliac extending over approximately 3 cm.  The external iliac is widely patent on the right.  The internal iliac is patent but there is a greater than 90% ostial stenosis.  On the left there is a subtotal occlusion of the left common iliac artery extending over a distance of approximately 3 cm.  The  distal common iliac artery is patent.  There is a 70% stenosis at the origin of the internal iliac which is otherwise patent.  The external iliac on the left is widely patent.  The right common femoral profunda femoris as well as the proximal two thirds of the SFA are diffusely diseased but there are no hemodynamically significant stenoses.  Extending over a distance of approximately 125 mm the distal SFA and the entire length of the popliteal are diffusely diseased with multiple greater than 90% stenoses.  There is a greater than 70% stenoses at the origin of the anterior tibial and then approximately 2 cm distal to this there is a 60% stenosis.  Beyond this lesion the anterior tibial is widely patent all the way down to the foot filling the dorsalis pedis and the pedal arch.  The tibioperoneal trunk is diffusely diseased with a subtotal occlusion the peroneal is diffusely diseased with multiple greater than 90% stenosis.  The posterior tibial is occluded throughout its course.   5000 units of heparin was given and allowed to circulate for proximally 4 minutes.  Under fluoroscopic guidance using a Kumpe catheter the advantage wire was negotiated through the popliteal and into the anterior tibial.  Initially a 4 mm x 100 mm ultra score balloon was used to angioplasty the popliteal from distal to proximal 2 separate inflations both to 10 atm were performed.  Next a 5 mm Lutonix drug-eluting balloon was used to angioplasty the distal popliteal inflation was to 10 atm for 1 minute.  Following this a 6 mm Lutonix drug-eluting balloon was used to angioplasty the more proximal popliteal and distal SFA.  Again inflation was to 10 atm for 1 minute.  Follow-up imaging now demonstrated less than 10% residual stenosis and I proceeded to address the anterior tibial lesion.  I performed magnified imaging in both AP as well as an LAO projection.  A 3 mm x 40 mm Ultraverse balloon was selected advanced across the ostial  stenosis and previously mentioned 60% stenosis in the anterior tibial inflation was to 10 atm for 2 minutes.  Follow-up imaging demonstrated less than 10% residual stenosis and at this point I pulled the sheath back into the left external iliac negotiated the advantage wire into the descending aorta and then exchanged the Ansell sheath for a 23 cm 7 French sheath.  The ultrasound was reprepped and delivered back onto the sterile field. The right common femoral was then imaged with the ultrasound it was noted to be echolucent and pulsatile indicating patency. Images recorded for the permanent record. Under real-time visualization a microneedle was inserted into the anterior wall the common femoral artery microwire was then advanced without difficulty under fluoroscopic guidance followed by placement of the micro-sheath.  A Magic torque wire was then negotiated under fluoroscopic guidance into the aorta.  23 cm 7 French sheath was then placed.  Magnified images of the aortic bifurcation were then made using hand injection contrast from the femoral sheaths. After appropriate sizing a 7 mm x 38 mm lifestream stent was selected for the right and a 6 mm x 38 mm lifestream stent was selected for the left. There were then advanced and positioned just above the aortic bifurcation. Insufflation for full expansion of the stents was performed simultaneously. Follow-up imaging was then performed and both stents were noted to be  undersized and therefore an 8 mm x 40 mm Lutonix drug-eluting balloon was advanced up the right side and an 9 mm x 40 mm Lutonix drug-eluting balloon was advanced up the left side simultaneous inflations to 10 atm were performed.  Hand-injection of contrast was then performed in the LAO and RAO projections through both sheaths which demonstrated wide patency of both stents with excellent apposition to the walls of the common iliac arteries.  There is no rapid flow contrast with less than 10%  residual stenosis noted bilaterally in the common iliac arteries.  Oblique views were then obtained of the groins in succession and Star close device is deployed without difficulty. There were no immediate complications   Findings:  The abdominal aorta is diffusely diseased but there are no hemodynamically significant stenoses however the extent of the plaque increases significantly at the distal aorta on the right there is a 70% stenosis of the common iliac extending over approximately 3 cm.  The external iliac is widely patent on the right.  The internal iliac is patent but there is a greater than 90% ostial stenosis.  On the left there is a subtotal occlusion of the left common iliac artery extending over a distance of approximately 3 cm.  The distal common iliac artery is patent.  There is a 70% stenosis at the origin of the internal iliac which is otherwise patent.  The external iliac on the left is widely patent.  The right common femoral profunda femoris as well as the proximal two thirds of the SFA are diffusely diseased but there are no hemodynamically significant stenoses.  Extending over a distance of approximately 125 mm the distal SFA and the entire length of the popliteal are diffusely diseased with multiple greater than 90% stenoses.  There is a greater than 70% stenoses at the origin of the anterior tibial and then approximately 2 cm distal to this there is a 60% stenosis.  Beyond this lesion the anterior tibial is widely patent all the way down to the foot filling the dorsalis pedis and the pedal arch.  The tibioperoneal trunk is diffusely diseased with a subtotal occlusion the peroneal is diffusely diseased with multiple greater than 90% stenosis.  The posterior tibial is occluded throughout its course.   Following angioplasty of the right anterior tibial to 3 mm there is now wide patency with less than 10% residual stenosis.  Following angioplasty of the distal SFA and popliteal with a 6  mm balloon proximally and a 5 mm balloon distally there is now wide patency with less than 10% residual stenosis of the SFA and popliteal on the right   Following placement of the iliac stents there is now wide patency with less than 10% residual stenosis with rapid flow through the aortic bifurcation bilaterally.  Summary:  Successful reconstruction of the distal aorta and bilateral iliac arteries as well as revascularization of the right lower extremity  Disposition: Patient was taken to the recovery room in stable condition having tolerated the procedure well.  Belenda Cruise Nasia Cannan 04/08/2021,4:26 PM

## 2021-04-08 NOTE — Care Management Important Message (Signed)
Important Message  Patient Details  Name: Richard Zavala MRN: [id number] Date of Birth: 10-12-1961   Medicare Important Message Given:  Yes     [name] 04/08/2021, 11:56 AM

## 2021-04-08 NOTE — Progress Notes (Signed)
[facility] PROGRESS NOTE Date of Admission:  04/02/2021     ID: Richard Zavala is a 60 y.o. male with LE wound and cellulitis Active Problems:   Cellulitis in diabetic foot ([place])   Subjective: No fevers, wbc nml. Lost IV access so changed to cipro and doxy. Cx with MSSA, rare, S to doxy and bactrim.  ROS  Eleven systems are reviewed and negative except per hpi  Medications:  Antibiotics Given (last 72 hours)    Date/Time Action Medication Dose Rate   04/06/21 1545 Given  [patient was sleeping from pain medication that had been given]   [MAR Hold] doxycycline (VIBRA-TABS) tablet 100 mg (MAR Hold since Tue 04/08/2021 at 1341.Hold Reason: Transfer to a Procedural area.) 100 mg    04/06/21 1727 Given   ciprofloxacin (CIPRO) tablet 500 mg 500 mg    04/06/21 2226 Given   [MAR Hold] doxycycline (VIBRA-TABS) tablet 100 mg (MAR Hold since Tue 04/08/2021 at 1341.Hold Reason: Transfer to a Procedural area.) 100 mg    04/07/21 2124 Given   [MAR Hold] doxycycline (VIBRA-TABS) tablet 100 mg (MAR Hold since Tue 04/08/2021 at 1341.Hold Reason: Transfer to a Procedural area.) 100 mg    04/08/21 0925 Given   [MAR Hold] doxycycline (VIBRA-TABS) tablet 100 mg (MAR Hold since Tue 04/08/2021 at 1341.Hold Reason: Transfer to a Procedural area.) 100 mg    04/08/21 1440 New Bag/Given   clindamycin (CLEOCIN) IVPB 300 mg 300 mg 100 mL/hr     . fentaNYL      . heparin sodium (porcine)      . midazolam      . [MAR Hold] atorvastatin  80 mg Oral QHS  . [MAR Hold] carvedilol  12.5 mg Oral BID WC  . [MAR Hold] Chlorhexidine Gluconate Cloth  6 each Topical Q0600  . [MAR Hold] doxycycline  100 mg Oral Q12H  . [MAR Hold] furosemide  80 mg Oral Q T,Th,S,Su  . [MAR Hold] heparin injection (subcutaneous)  5,000 Units Subcutaneous Q12H  . [MAR Hold] insulin aspart  0-15 Units Subcutaneous TID PC & HS  . [MAR Hold] lidocaine  1 patch Transdermal Daily  . [MAR Hold] pantoprazole  40 mg Oral BID  AC  . [MAR Hold] sodium chloride flush  3 mL Intravenous Q12H    Objective: Vital signs in last 24 hours: Temp:  [97.8 F (36.6 C)-98.8 F (37.1 C)] 97.8 F (36.6 C) (04/19 1350) Pulse Rate:  [64-77] 64 (04/19 1440) Resp:  [16-20] 17 (04/19 1350) BP: (111-153)/(63-101) 138/84 (04/19 1440) SpO2:  [90 %-97 %] 96 % (04/19 1440) Constitutional: He is oriented to person, place, and time. He appears well-developed and well-nourished. No distress.  HENT: anicteric, L eye is cloudy Mouth/Throat: Oropharynx is clear and moist. No oropharyngeal exudate.  Cardiovascular: Normal rate, regular rhythm and normal heart sounds. Pulmonary/Chest: Effort normal and breath sounds normal. No respiratory distress. He has no wheezes.  Abdominal: Soft. Bowel sounds are normal. He exhibits no distension. There is no tenderness.  Lymphadenopathy: He has no cervical adenopathy.  Neurological: He is alert and oriented to person, place, and time.  Diminished sensation RLE Skin: see photo. Multiple wounds. Surrounding erythema. Psychiatric: He has a normal mood and affect. His behavior is normal.  Diminished pulses RLE, poor cap refill    Lab Results Recent Labs    04/07/21 0525 04/08/21 0501  WBC 5.5  --   HGB 10.3*  --   HCT 31.6*  --   NA  130*  --   K 6.0* 5.3*  CL 93*  --   CO2 21*  --   BUN 82*  --   CREATININE 7.10*  --     Microbiology: Results for orders placed or performed during the hospital encounter of 04/02/21  Culture, blood (routine x 2)     Status: None   Collection Time: 04/02/21  6:01 PM   Specimen: Left Antecubital; Blood  Result Value Ref Range Status   Specimen Description LEFT ANTECUBITAL  Final   Special Requests   Final    BOTTLES DRAWN AEROBIC AND ANAEROBIC Blood Culture results may not be optimal due to an excessive volume of blood received in culture bottles   Culture   Final    NO GROWTH 5 DAYS Performed at [facility], [address] [place],  [place] [address]    Report Status 04/07/2021 FINAL  Final  Culture, blood (routine x 2)     Status: None   Collection Time: 04/02/21  8:15 PM   Specimen: BLOOD  Result Value Ref Range Status   Specimen Description BLOOD BLOOD LEFT HAND  Final   Special Requests   Final    BOTTLES DRAWN AEROBIC AND ANAEROBIC Blood Culture adequate volume   Culture   Final    NO GROWTH 5 DAYS Performed at [facility], [address], [place], [place] [address]    Report Status 04/07/2021 FINAL  Final  SARS CORONAVIRUS 2 (TAT 6-24 HRS) Nasopharyngeal Nasopharyngeal Swab     Status: None   Collection Time: 04/02/21  8:20 PM   Specimen: Nasopharyngeal Swab  Result Value Ref Range Status   SARS Coronavirus 2 NEGATIVE NEGATIVE Final    Comment: (NOTE) SARS-CoV-2 target nucleic acids are NOT DETECTED.  The SARS-CoV-2 RNA is generally detectable in upper and lower respiratory specimens during the acute phase of infection. Negative results do not preclude SARS-CoV-2 infection, do not rule out co-infections with other pathogens, and should not be used as the sole basis for treatment or other patient management decisions. Negative results must be combined with clinical observations, patient history, and epidemiological information. The expected result is Negative.  Fact Sheet for Patients: SugarRoll.be  Fact Sheet for Healthcare Providers: https://www.woods-mathews.com/  This test is not yet approved or cleared by the [place] FDA and  has been authorized for detection and/or diagnosis of SARS-CoV-2 by FDA under an Emergency Use Authorization (EUA). This EUA will remain  in effect (meaning this test can be used) for the duration of the COVID-19 declaration under Se ction 564(b)(1) of the Act, 21 U.S.C. section 360bbb-3(b)(1), unless the authorization is terminated or revoked sooner.  Performed at [facility], [place] [address], [place],  [place] [address]   Aerobic Culture w Gram Stain (superficial specimen)     Status: None   Collection Time: 04/03/21  2:34 PM   Specimen: Wound  Result Value Ref Range Status   Specimen Description   Final    WOUND Performed at [facility], [address], [place], [place] [address]    Special Requests   Final    LEFT FOOT Performed at [facility], [place], [place] [address]    Gram Stain   Final    RARE WBC PRESENT,BOTH PMN AND MONONUCLEAR RARE GRAM POSITIVE RODS    Culture   Final    RARE STAPHYLOCOCCUS AUREUS WITHIN MIXED ORGANISMS Performed at [facility], [place] [address],  [place], [place] [address]    Report Status 04/07/2021 FINAL  Final   Organism ID, Bacteria STAPHYLOCOCCUS AUREUS  Final      Susceptibility   Staphylococcus aureus - MIC*    CIPROFLOXACIN <=0.5 SENSITIVE Sensitive     ERYTHROMYCIN >=8 RESISTANT Resistant     GENTAMICIN <=0.5 SENSITIVE Sensitive     OXACILLIN <=0.25 SENSITIVE Sensitive     TETRACYCLINE <=1 SENSITIVE Sensitive     VANCOMYCIN 1 SENSITIVE Sensitive     TRIMETH/SULFA <=10 SENSITIVE Sensitive     CLINDAMYCIN RESISTANT Resistant     RIFAMPIN <=0.5 SENSITIVE Sensitive     Inducible Clindamycin POSITIVE Resistant     * RARE STAPHYLOCOCCUS AUREUS    Studies/Results: No results found.  Assessment/Plan: JEDI CATALFAMO is a 60 y.o. male with ESRD, DM complicated by PN and multiple other medical issues (CHF, coronary artery disease, hypertension, dyslipidemia, peripheral neuropathy, CVA and BPH) now with nonhealing ulcers of RLE. On exam he has diminished DP and TP pulses, cool foot and poor cap refill. Suspect PAD with overlying cellulitis  Recommendations Cont doxy.  PCN allergic. DC cipro. Vascular wu pending Thank you very much for the consult. Will follow with you.  Leonel Ramsay   04/08/2021, 2:52 PM

## 2021-04-08 NOTE — Progress Notes (Signed)
[facility]  ROUNDING NOTE   Subjective:    Patient seen sitting at the edge of bed Currently NPO for procedure Frustrated and upset about NPO and procedure   Objective:  Vital signs in last 24 hours:  Temp:  [98 F (36.7 C)-98.8 F (37.1 C)] 98.2 F (36.8 C) (04/19 0835) Pulse Rate:  [60-77] 65 (04/19 0835) Resp:  [13-20] 20 (04/19 0835) BP: (111-152)/(63-117) 151/95 (04/19 0835) SpO2:  [86 %-99 %] 96 % (04/19 0835)  Weight change:  Filed Weights   04/02/21 1755 04/03/21 0130  Weight: 71 kg 73.9 kg    Intake/Output: I/O last 3 completed shifts: In: -  Out: 1500 [Other:1500]   Intake/Output this shift:  No intake/output data recorded.  Physical Exam: General: NAD, sitting on edge of bed  Head: Normocephalic, atraumatic. Moist oral mucosal membranes  Eyes: Anicteric  Lungs:  Clear to auscultation  Heart: Regular rate and rhythm  Abdomen:  Soft, nontender, non distended.   Extremities:  trace peripheral edema in right ankle, right foot currently guaze dressed, left leg wrapped in dressing,  Neurologic: Nonfocal, moving all four extremities  Skin: Ulcerations to the right foot  Access: Right UE AVF     Basic Metabolic Panel: Recent Labs  Lab 04/02/21 1801 04/03/21 0543 04/07/21 0525 04/08/21 0501  NA 135 138 130*  --   K 4.4 4.2 6.0* 5.3*  CL 99 101 93*  --   CO2 23 25 21*  --   GLUCOSE 216* 55* 155*  --   BUN 39* 49* 82*  --   CREATININE 4.82* 5.30* 7.10*  --   CALCIUM 8.3* 8.4* 8.4*  --     Liver Function Tests: Recent Labs  Lab 04/02/21 1801  AST 19  ALT 16  ALKPHOS 119  BILITOT 0.8  PROT 6.3*  ALBUMIN 2.6*   No results for input(s): LIPASE, AMYLASE in the last 168 hours. No results for input(s): AMMONIA in the last 168 hours.  CBC: Recent Labs  Lab 04/02/21 1801 04/03/21 0543 04/07/21 0525  WBC 6.6 5.9 5.5  NEUTROABS 4.9  --   --   HGB 9.4* 9.4* 10.3*  HCT 29.5* 29.6* 31.6*  MCV 98.0 98.7 96.3  PLT 234 211 209     Cardiac Enzymes: No results for input(s): CKTOTAL, CKMB, CKMBINDEX, TROPONINI in the last 168 hours.  BNP: Invalid input(s): POCBNP  CBG: Recent Labs  Lab 04/06/21 2052 04/07/21 0746 04/07/21 1639 04/07/21 2117 04/08/21 0750  GLUCAP 111* 164* 157* 194* 209*    Microbiology: Results for orders placed or performed during the hospital encounter of 04/02/21  Culture, blood (routine x 2)     Status: None   Collection Time: 04/02/21  6:01 PM   Specimen: Left Antecubital; Blood  Result Value Ref Range Status   Specimen Description LEFT ANTECUBITAL  Final   Special Requests   Final    BOTTLES DRAWN AEROBIC AND ANAEROBIC Blood Culture results may not be optimal due to an excessive volume of blood received in culture bottles   Culture   Final    NO GROWTH 5 DAYS Performed at [facility], [address] [place], [place] [address]    Report Status 04/07/2021 FINAL  Final  Culture, blood (routine x 2)     Status: None   Collection Time: 04/02/21  8:15 PM   Specimen: BLOOD  Result Value Ref Range Status   Specimen Description BLOOD BLOOD LEFT HAND  Final   Special Requests  Final    BOTTLES DRAWN AEROBIC AND ANAEROBIC Blood Culture adequate volume   Culture   Final    NO GROWTH 5 DAYS Performed at [facility], [address] [place], [place] [address]    Report Status 04/07/2021 FINAL  Final  SARS CORONAVIRUS 2 (TAT 6-24 HRS) Nasopharyngeal Nasopharyngeal Swab     Status: None   Collection Time: 04/02/21  8:20 PM   Specimen: Nasopharyngeal Swab  Result Value Ref Range Status   SARS Coronavirus 2 NEGATIVE NEGATIVE Final    Comment: (NOTE) SARS-CoV-2 target nucleic acids are NOT DETECTED.  The SARS-CoV-2 RNA is generally detectable in upper and lower respiratory specimens during the acute phase of infection. Negative results do not preclude SARS-CoV-2 infection, do not rule out co-infections with other pathogens, and should not be used as the sole  basis for treatment or other patient management decisions. Negative results must be combined with clinical observations, patient history, and epidemiological information. The expected result is Negative.  Fact Sheet for Patients: SugarRoll.be  Fact Sheet for Healthcare Providers: https://www.woods-mathews.com/  This test is not yet approved or cleared by the [place] FDA and  has been authorized for detection and/or diagnosis of SARS-CoV-2 by FDA under an Emergency Use Authorization (EUA). This EUA will remain  in effect (meaning this test can be used) for the duration of the COVID-19 declaration under Se ction 564(b)(1) of the Act, 21 U.S.C. section 360bbb-3(b)(1), unless the authorization is terminated or revoked sooner.  Performed at [facility], [place] [address], [place], [place] [address]   Aerobic Culture w Gram Stain (superficial specimen)     Status: None   Collection Time: 04/03/21  2:34 PM   Specimen: Wound  Result Value Ref Range Status   Specimen Description   Final    WOUND Performed at [facility], [address] [place], [place] [address]    Special Requests   Final    LEFT FOOT Performed at [facility], [place], [place] [address]    Gram Stain   Final    RARE WBC PRESENT,BOTH PMN AND MONONUCLEAR RARE GRAM POSITIVE RODS    Culture   Final    RARE STAPHYLOCOCCUS AUREUS WITHIN MIXED ORGANISMS Performed at [facility], [place] [address] [place], [place] [address]    Report Status 04/07/2021 FINAL  Final   Organism ID, Bacteria STAPHYLOCOCCUS AUREUS  Final      Susceptibility   Staphylococcus aureus - MIC*    CIPROFLOXACIN <=0.5 SENSITIVE Sensitive     ERYTHROMYCIN >=8 RESISTANT Resistant     GENTAMICIN <=0.5 SENSITIVE Sensitive     OXACILLIN <=0.25 SENSITIVE Sensitive     TETRACYCLINE <=1 SENSITIVE Sensitive     VANCOMYCIN 1 SENSITIVE Sensitive      TRIMETH/SULFA <=10 SENSITIVE Sensitive     CLINDAMYCIN RESISTANT Resistant     RIFAMPIN <=0.5 SENSITIVE Sensitive     Inducible Clindamycin POSITIVE Resistant     * RARE STAPHYLOCOCCUS AUREUS    Coagulation Studies: No results for input(s): LABPROT, INR in the last 72 hours.  Urinalysis: No results for input(s): COLORURINE, LABSPEC, PHURINE, GLUCOSEU, HGBUR, BILIRUBINUR, KETONESUR, PROTEINUR, UROBILINOGEN, NITRITE, LEUKOCYTESUR in the last 72 hours.  Invalid input(s): APPERANCEUR    Imaging: No results found.   Medications:   . sodium chloride     . atorvastatin  80 mg Oral QHS  . carvedilol  12.5 mg Oral BID WC  . Chlorhexidine Gluconate Cloth  6 each Topical Q0600  . doxycycline  100 mg Oral Q12H  . furosemide  80 mg Oral Q T,Th,S,Su  . heparin injection (subcutaneous)  5,000 Units Subcutaneous Q12H  . insulin aspart  0-15 Units Subcutaneous TID PC & HS  . lidocaine  1 patch Transdermal Daily  . pantoprazole  40 mg Oral BID AC  . sodium chloride flush  3 mL Intravenous Q12H   sodium chloride, acetaminophen **OR** acetaminophen, albuterol, cyclobenzaprine, docusate sodium, magnesium hydroxide, morphine injection, ondansetron **OR** ondansetron (ZOFRAN) IV, oxyCODONE, sodium chloride flush, traZODone  Assessment/ Plan:  [name] is a 60 y.o.  male with past medical history of ESRD on hemodialysis, anemia, diabetes, CAD, CHF, pulmonary hypertension and osteoporosis. He presents to the ED with leg swelling found to be cellulitis.   [facility] MWF  Rt lower AVF 72kg  1. ESRD on HD - Received dialysis yesterday - UF Goal 1.5L achieved - Will dialyze tomorrow  2. Anemia of CKD : hemoglobin 10.3 - EPO as outpatient.  - Hgb at goal  3. Secondary hyperparathyroidism of renal origin  Not currently on a binder.   4. Hypertension:  - Elevated - continue carvedilol and furosemide.    LOS: 6   4/19/20229:47 AM

## 2021-04-08 NOTE — Progress Notes (Signed)
Attempted IV placement x2 to left hand without success.

## 2021-04-08 NOTE — Progress Notes (Signed)
PROGRESS NOTE    Richard Zavala  JTT:[id number] DOB: 1961-05-17 DOA: 04/02/2021 PCP: Midge Minium, PA    Brief Narrative:  Richard Zavala is a 60 y.o. Caucasian male with medical history significant for multiple medical problems that are mentioned below including CHF, coronary artery disease, end-stage renal disease on hemodialysis, hypertension, dyslipidemia, peripheral neuropathy, CVA and BPH, who presented to the ER with acute onset of worsening right foot swelling with erythema and mild extension to the right leg for last 4 days.  The patient had ORIF of the left leg during recent admission here and was discharged on 03/25/2021.  He has a right foot ulcer and scattered healed ulcers over his foot and leg.  He denied any fever or chills.  He has no problems currently from his left leg.  Earlier today he finished his session of hemodialysis that he gets on Monday, Wednesday and Fridays.  No chest pain or palpitations.  No reported cough or wheezing or dyspnea.  ED Course: Upon presentation to the ER, temperature was 100 with a blood pressure 137/99 with otherwise normal vital signs.  Labs revealed blood glucose of 216 and a BUN of 39 with creatinine 4.82 with anion gap of 13.  Albumin was 2.6.  Lactic acid was 1.1.  CBC showed anemia close to his baseline.  Blood culture was drawn.  4/14- no overnight issues. Saw pt this am. In the after noon nsg stated pts bp 81/68, repeat with manual 153/71.  Was planning to give bolus and midodrine, but d/c'd since repeat with manual was stable.  4/15-found with two chocolate bars son brought to him. Refused insulin. Discussed good glycemic control, but appears not to care what I am trying to discuss about it  4/16-sunkist regular at bedside. Pt c/o why HD did not take a lot of fluid from him, he wanted more fluid taken out but they refused. No other issues  4/17 c/o back spasm. Lots iv line multiple times.  4/18 seen in hemodialysis.  Tolerating.   Complaining that he wants more fluid to be taken off. 4/19-plan for angiography today by vascular  Consultants:   ID, vascular surgery , nephrology                               Procedures:  Antimicrobials:   Ciprofloxacin and doxycycline   Subjective: No complaints of chest pain, shortness of breath, dizziness or abdominal pain or foot pain  Objective: Vitals:   04/07/21 1952 04/07/21 2352 04/08/21 0449 04/08/21 0835  BP: (!) 150/98 (!) 148/90 (!) 111/101 (!) 151/95  Pulse: 70 68 69 65  Resp: 17 20 18 20   Temp: 98.6 F (37 C) 98 F (36.7 C) 98 F (36.7 C) 98.2 F (36.8 C)  TempSrc: Oral Oral Oral   SpO2: 97% 95% 90% 96%  Weight:      Height:        Intake/Output Summary (Last 24 hours) at 04/08/2021 0849 Last data filed at 04/07/2021 1245 Gross per 24 hour  Intake --  Output 1500 ml  Net -1500 ml   Filed Weights   04/02/21 1755 04/03/21 0130  Weight: 71 kg 73.9 kg    Examination: NAD, calm  CTA no wheeze rales rhonchi's Regular S1-S2 no gallops Soft benign positive bowel sounds Left lower extremity wrapped right lower extremity mild edema, decreased erythema cool to touch AAOx3 Mood and affect appropriate in current setting  Data Reviewed: I have personally reviewed following labs and imaging studies  CBC: Recent Labs  Lab 04/02/21 1801 04/03/21 0543 04/07/21 0525  WBC 6.6 5.9 5.5  NEUTROABS 4.9  --   --   HGB 9.4* 9.4* 10.3*  HCT 29.5* 29.6* 31.6*  MCV 98.0 98.7 96.3  PLT 234 211 106   Basic Metabolic Panel: Recent Labs  Lab 04/02/21 1801 04/03/21 0543 04/07/21 0525 04/08/21 0501  NA 135 138 130*  --   K 4.4 4.2 6.0* 5.3*  CL 99 101 93*  --   CO2 23 25 21*  --   GLUCOSE 216* 55* 155*  --   BUN 39* 49* 82*  --   CREATININE 4.82* 5.30* 7.10*  --   CALCIUM 8.3* 8.4* 8.4*  --    GFR: Estimated Creatinine Clearance: 11 mL/min (A) (by C-G formula based on SCr of 7.1 mg/dL (H)). Liver Function Tests: Recent Labs  Lab 04/02/21 1801   AST 19  ALT 16  ALKPHOS 119  BILITOT 0.8  PROT 6.3*  ALBUMIN 2.6*   No results for input(s): LIPASE, AMYLASE in the last 168 hours. No results for input(s): AMMONIA in the last 168 hours. Coagulation Profile: Recent Labs  Lab 04/03/21 0543  INR 1.4*   Cardiac Enzymes: No results for input(s): CKTOTAL, CKMB, CKMBINDEX, TROPONINI in the last 168 hours. BNP (last 3 results) No results for input(s): PROBNP in the last 8760 hours. HbA1C: No results for input(s): HGBA1C in the last 72 hours. CBG: Recent Labs  Lab 04/06/21 2052 04/07/21 0746 04/07/21 1639 04/07/21 2117 04/08/21 0750  GLUCAP 111* 164* 157* 194* 209*   Lipid Profile: No results for input(s): CHOL, HDL, LDLCALC, TRIG, CHOLHDL, LDLDIRECT in the last 72 hours. Thyroid Function Tests: No results for input(s): TSH, T4TOTAL, FREET4, T3FREE, THYROIDAB in the last 72 hours. Anemia Panel: No results for input(s): VITAMINB12, FOLATE, FERRITIN, TIBC, IRON, RETICCTPCT in the last 72 hours. Sepsis Labs: Recent Labs  Lab 04/02/21 1801  LATICACIDVEN 1.1    Recent Results (from the past 240 hour(s))  Culture, blood (routine x 2)     Status: None   Collection Time: 04/02/21  6:01 PM   Specimen: Left Antecubital; Blood  Result Value Ref Range Status   Specimen Description LEFT ANTECUBITAL  Final   Special Requests   Final    BOTTLES DRAWN AEROBIC AND ANAEROBIC Blood Culture results may not be optimal due to an excessive volume of blood received in culture bottles   Culture   Final    NO GROWTH 5 DAYS Performed at [facility], [address] [place], [place] [address]    Report Status 04/07/2021 FINAL  Final  Culture, blood (routine x 2)     Status: None   Collection Time: 04/02/21  8:15 PM   Specimen: BLOOD  Result Value Ref Range Status   Specimen Description BLOOD BLOOD LEFT HAND  Final   Special Requests   Final    BOTTLES DRAWN AEROBIC AND ANAEROBIC Blood Culture adequate volume   Culture    Final    NO GROWTH 5 DAYS Performed at [facility], [address] [place],  [address]    Report Status 04/07/2021 FINAL  Final  SARS CORONAVIRUS 2 (TAT 6-24 HRS) Nasopharyngeal Nasopharyngeal Swab     Status: None   Collection Time: 04/02/21  8:20 PM   Specimen: Nasopharyngeal Swab  Result Value Ref Range Status   SARS Coronavirus 2 NEGATIVE NEGATIVE Final  Comment: (NOTE) SARS-CoV-2 target nucleic acids are NOT DETECTED.  The SARS-CoV-2 RNA is generally detectable in upper and lower respiratory specimens during the acute phase of infection. Negative results do not preclude SARS-CoV-2 infection, do not rule out co-infections with other pathogens, and should not be used as the sole basis for treatment or other patient management decisions. Negative results must be combined with clinical observations, patient history, and epidemiological information. The expected result is Negative.  Fact Sheet for Patients: SugarRoll.be  Fact Sheet for Healthcare Providers: https://www.woods-mathews.com/  This test is not yet approved or cleared by the [place] FDA and  has been authorized for detection and/or diagnosis of SARS-CoV-2 by FDA under an Emergency Use Authorization (EUA). This EUA will remain  in effect (meaning this test can be used) for the duration of the COVID-19 declaration under Se ction 564(b)(1) of the Act, 21 U.S.C. section 360bbb-3(b)(1), unless the authorization is terminated or revoked sooner.  Performed at [facility], [place] [address] [place], [place] [address]   Aerobic Culture w Gram Stain (superficial specimen)     Status: None   Collection Time: 04/03/21  2:34 PM   Specimen: Wound  Result Value Ref Range Status   Specimen Description   Final    WOUND Performed at [facility], [address], [place], [place] [address]    Special Requests   Final    LEFT FOOT Performed at  [facility], [place], [place] [address]    Gram Stain   Final    RARE WBC PRESENT,BOTH PMN AND MONONUCLEAR RARE GRAM POSITIVE RODS    Culture   Final    RARE STAPHYLOCOCCUS AUREUS WITHIN MIXED ORGANISMS Performed at [facility], [place] [address] [place], [place] [address]    Report Status 04/07/2021 FINAL  Final   Organism ID, Bacteria STAPHYLOCOCCUS AUREUS  Final      Susceptibility   Staphylococcus aureus - MIC*    CIPROFLOXACIN <=0.5 SENSITIVE Sensitive     ERYTHROMYCIN >=8 RESISTANT Resistant     GENTAMICIN <=0.5 SENSITIVE Sensitive     OXACILLIN <=0.25 SENSITIVE Sensitive     TETRACYCLINE <=1 SENSITIVE Sensitive     VANCOMYCIN 1 SENSITIVE Sensitive     TRIMETH/SULFA <=10 SENSITIVE Sensitive     CLINDAMYCIN RESISTANT Resistant     RIFAMPIN <=0.5 SENSITIVE Sensitive     Inducible Clindamycin POSITIVE Resistant     * RARE STAPHYLOCOCCUS AUREUS         Radiology Studies: No results found.      Scheduled Meds: . atorvastatin  80 mg Oral QHS  . carvedilol  12.5 mg Oral BID WC  . Chlorhexidine Gluconate Cloth  6 each Topical Q0600  . doxycycline  100 mg Oral Q12H  . furosemide  80 mg Oral Q T,Th,S,Su  . heparin injection (subcutaneous)  5,000 Units Subcutaneous Q12H  . insulin aspart  0-15 Units Subcutaneous TID PC & HS  . lidocaine  1 patch Transdermal Daily  . pantoprazole  40 mg Oral BID AC  . sodium chloride flush  3 mL Intravenous Q12H   Continuous Infusions: . sodium chloride      Assessment & Plan:   Active Problems:   Cellulitis in diabetic foot ([place])   1.  Diabetic right foot moderate nonpurulent cellulitis secondary to infected ulcer in the setting of immunosuppression with diabetes mellitus. IDs input was appreciated. Cultures done on largest wound to rule out resistant organism Continue Vanco  and cefepime Continue on ivabx with cefepime and vancomycin Since difficult to assess pt's peripheral pulses /pedal ,  foot cooler to touch, will consult vascular surgery, input appreciated-we will obtain ABIs initially 4/16-vascular following-he may require angiography with intervention to improve his circulation to allow for wound healing per vascular  Podiatry consulted input was appreciated  X-ray negative on the right foot as far as osteomyelitis. Podiatry recommends IV antibiotics to be continue to monitor the wound.  Consider OR debridement however await arterial studies and vascular's input prior to surgery if needed for debridement. 4/17-discussed with ID Dr. Ola Spurr via chat about po abx option as pt has lost iv site multiple times since yesterday. Recommended ciprofloxacin and doxycycline. 4/19-plan for angiography of Rt foot today by vascular surgery Dr. Delana Meyer Continue doxycycline, Cipr Ciprofloxacin discontinued by ID   2.  Type II diabetes mellitus with mild hyperglycemia. Patient refusing insulin 4/16 noncompliant with diet Discussed glycemic control with the patient for better wound healing Only on R ISS as he was refusing Lantus 4/19-BG stable Continue with RISS    3.  End-stage renal disease on hemodialysis. Hyperkalemia K 6.0...>5.3 today HD tomorrow Lasix 80 mg on nondialysis day Epogen as outpatient Nephrology following       4.  Dyslipidemia. Continue statin  5.  Essential hypertension. Continue Norvasc  6.  GERD. Continue Pepcid  7.  Coronary artery disease. -We will continue beta-blocker therapy, Imdur and aspirin as well as statin therapy as tolerated  PT/OT  DVT prophylaxis: heparin Code Status:full Family Communication: none at bedside  Status is: Inpatient  The patient remains inpatient due to severity of illness and requiring IV treatment.  Dispo: The patient is from:home              Anticipated d/c is to: TBD/home              Patient currently not medically stable   Difficult to place patient:no            LOS: 6 days    Time spent: 35 min with >50% on coc    Nolberto Hanlon, MD [facility] Pager [phone]  If 7PM-7AM, please contact night-coverage 04/08/2021, 8:49 AM

## 2021-04-08 NOTE — Evaluation (Signed)
Physical Therapy Evaluation Patient Details Name: Richard Zavala MRN: [id number] DOB: 01/30/1961 Today's Date: 04/08/2021   History of Present Illness  Richard Zavala is a 60 y.o. Caucasian male with medical history significant for multiple medical problems, including CHF, coronary artery disease, end-stage renal disease on hemodialysis, hypertension, dyslipidemia, peripheral neuropathy, CVA, and BPH, who presented to the ER with acute onset of worsening right foot swelling with erythema and mild extension to the right leg for last 4 days. The patient had ORIF of the left leg during recent admission here and was discharged on 03/25/2021.  He has a right foot ulcer and scattered healed ulcers over his foot and leg.  He denied any fever or chills.  He has no problems currently from his left leg apart from being NWBin.  Clinical Impression  Pt did not feel like doing a whole lot with PT but was willing to try and get to recliner.  He has not been able to eat/drink because of procedure this afternoon and was sure to let me know how thirsty and tired he was.  He did well to get to standing w/o a lot of assist, however he never really succeeded at maintaining L foot off the floor for more than a few seconds and even more so while trying to heel-toe/hop/shift R LE.  Pt is not appropriate to attempt anything further than a transfer at this time and while it seemed that he was able to take some weight off L he did not manage keeping weight off it very well.  Follow Up Recommendations SNF;Supervision for mobility/OOB    Equipment Recommendations   (per progress at rehab, may need w/c?)    Recommendations for Other Services       Precautions / Restrictions Precautions Precautions: Fall Required Braces or Orthoses: Splint/Cast Splint/Cast: L LE Restrictions Weight Bearing Restrictions: Yes RLE Weight Bearing: Weight bearing as tolerated LLE Weight Bearing: Non weight bearing      Mobility  Bed  Mobility Overal bed mobility: Modified Independent             General bed mobility comments: no physical assistance required to achieve EOB    Transfers Overall transfer level: Needs assistance Equipment used: Rolling walker (2 wheeled) Transfers: Sit to/from Stand Sit to Stand: Mod assist         General transfer comment: Pt was able to stand pivot to recliner from EOB short sit with mod assist + vcs. He struggles with maintaining proper wt bearing but did attempt to limit wt as much as possible but still struggles.  Ambulation/Gait             General Gait Details: No true ambulation, able to get to recliner from EOBt with mod assist and very labored effort. He struggles with maintaining proper wt bearing but did attempt to limit wt as much as possible, unsafe to do any real mobility while maintaining NWBing on L  Stairs            Wheelchair Mobility    Modified Rankin (Stroke Patients Only)       Balance Overall balance assessment: Needs assistance   Sitting balance-Leahy Scale: Good       Standing balance-Leahy Scale: Fair Standing balance comment: UE support on RW and cues to sustain WB restriction, ultimately he struggled to sustain true NWBing  Pertinent Vitals/Pain Pain Assessment: 0-10 Pain Score: 2  Pain Location: back pain    Home Living Family/patient expects to be discharged to:: Unsure Living Arrangements: Children Available Help at Discharge: Family;Available PRN/intermittently Type of Home: House Home Access: Stairs to enter   Entrance Stairs-Number of Steps: 1 Home Layout: One level Home Equipment: Walker - 2 wheels;Cane - single point;Shower seat      Prior Function Level of Independence: Needs assistance   Gait / Transfers Assistance Needed: prior to L foot surgery able to manage w/o AD, only occasionally used one.           Hand Dominance        Extremity/Trunk  Assessment   Upper Extremity Assessment Upper Extremity Assessment: Generalized weakness    Lower Extremity Assessment Lower Extremity Assessment: Generalized weakness RLE Sensation: history of peripheral neuropathy LLE Deficits / Details: tibial fx LLE: Unable to fully assess due to immobilization       Communication   Communication: No difficulties  Cognition Arousal/Alertness: Awake/alert Behavior During Therapy: Restless;Agitated Overall Cognitive Status: No family/caregiver present to determine baseline cognitive functioning                                        General Comments      Exercises     Assessment/Plan    PT Assessment Patient needs continued PT services  PT Problem List Decreased strength;Decreased range of motion;Decreased activity tolerance;Decreased balance;Decreased mobility;Decreased cognition;Decreased knowledge of use of DME;Decreased safety awareness;Decreased knowledge of precautions;Decreased coordination       PT Treatment Interventions DME instruction;Balance training;Gait training;Stair training;Functional mobility training;Therapeutic activities;Therapeutic exercise;Wheelchair mobility training;Patient/family education    PT Goals (Current goals can be found in the Care Plan section)  Acute Rehab PT Goals Patient Stated Goal: to get home PT Goal Formulation: With patient Time For Goal Achievement: 04/22/21 Potential to Achieve Goals: Poor    Frequency Min 2X/week   Barriers to discharge        Co-evaluation               AM-PAC PT "6 Clicks" Mobility  Outcome Measure Help needed turning from your back to your side while in a flat bed without using bedrails?: A Little Help needed moving from lying on your back to sitting on the side of a flat bed without using bedrails?: A Little Help needed moving to and from a bed to a chair (including a wheelchair)?: Total Help needed standing up from a chair using your  arms (e.g., wheelchair or bedside chair)?: A Lot Help needed to walk in hospital room?: Total Help needed climbing 3-5 steps with a railing? : Total 6 Click Score: 11    End of Session Equipment Utilized During Treatment: Gait belt Activity Tolerance: No increased pain;Other (comment) Patient left: in chair;with call bell/phone within reach;with chair alarm set;with nursing/sitter in room Nurse Communication: Mobility status PT Visit Diagnosis: Unsteadiness on feet (R26.81);Difficulty in walking, not elsewhere classified (R26.2);Other abnormalities of gait and mobility (R26.89);Muscle weakness (generalized) (M62.81);Pain Pain - Right/Left: Left Pain - part of body: Ankle and joints of foot    Time: 4270-6237 PT Time Calculation (min) (ACUTE ONLY): 27 min   Charges:   PT Evaluation $PT Eval Low Complexity: 1 Low PT Treatments $Therapeutic Activity: 8-22 mins        Kreg Shropshire, DPT 04/08/2021, 3:26 PM

## 2021-04-08 NOTE — Evaluation (Addendum)
Occupational Therapy Evaluation Patient Details Name: Richard Zavala MRN: [id number] DOB: December 12, 1961 Today's Date: 04/08/2021    History of Present Illness Richard Zavala is a 60 y.o. Caucasian male with medical history significant for multiple medical problems, including CHF, coronary artery disease, end-stage renal disease on hemodialysis, hypertension, dyslipidemia, peripheral neuropathy, CVA, and BPH, who presented to the ER with acute onset of worsening right foot swelling with erythema and mild extension to the right leg for last 4 days. The patient had ORIF of the left leg during recent admission here and was discharged on 03/25/2021.  He has a right foot ulcer and scattered healed ulcers over his foot and leg.  He denied any fever or chills.  He has no problems currently from his left leg.   Clinical Impression   Richard Zavala presents today with generalized weakness and reduced endurance. He is oriented to time and place, but displays limited understanding of situation/deficits. His speech is tangential, he makes no eye contact, and is hostile/unpleasant throughout session. Pt's K+ is slightly elevated today; RN gives clearance for tx. Pt lives with his son, reports being Mod I in ADL prior to recent hospitalizations (for COVID in Feb 2022, L LE fx in March 2022), and receiving assistance from his son for IADL, including shopping, cooking, and providing transportation to HD 3x/wk. He reports occasional use of RW or SPC, although primarily ambulating at home w/o AD. He recalls having "about 5" falls in the previous year, the most recent of which led to his current L tibia fx. Pt is legally blind, unable to drive, not employed. He reports managing his own medications, including taking daily insulin, but states that he never checks his blood glucose. Although pt has chronic pain at baseline, he denies pain this AM. Today he engages in UB dressing and grooming with SUPV/CGA, but requires Max A for LB  dressing and OOB mobility. Unstable in standing, particularly challenged by NWB status of LLE. Recommend continuing to see pt acutely to address deficits in fxl mobility and balance. Given pt's need for assistance with transfers, recommend STR post hospital DC, as pt is home alone during the day (son works 7AM-3PM). Richard Zavala is left sitting EOB with bed alarm set and tray table and call button near.    Follow Up Recommendations  SNF    Equipment Recommendations       Recommendations for Other Services       Precautions / Restrictions Precautions Precautions: Fall Required Braces or Orthoses: Splint/Cast Splint/Cast: L LE Restrictions Weight Bearing Restrictions: Yes RLE Weight Bearing: Weight bearing as tolerated LLE Weight Bearing: Non weight bearing      Mobility Bed Mobility Overal bed mobility: Modified Independent                  Transfers Overall transfer level: Needs assistance Equipment used: Rolling walker (2 wheeled) Transfers: Sit to/from Stand Sit to Stand: Mod assist Stand pivot transfers: Max assist            Balance Overall balance assessment: Needs assistance Sitting-balance support: Feet supported Sitting balance-Leahy Scale: Good Sitting balance - Comments: Able to sit EOB, maintain balance while reaching beyond BOS, but with R lateral lean Postural control: Right lateral lean Standing balance support: Bilateral upper extremity supported Standing balance-Leahy Scale: Poor                             ADL either performed  or assessed with clinical judgement   ADL Overall ADL's : Needs assistance/impaired     Grooming: Wash/dry hands;Wash/dry face;Applying deodorant;Set up;Min guard;Cueing for sequencing Grooming Details (indicate cue type and reason): uses mouth to open container         Upper Body Dressing : Supervision/safety;Cueing for sequencing;Sitting Upper Body Dressing Details (indicate cue type and reason):  donning hospital gown Lower Body Dressing: Maximal assistance Lower Body Dressing Details (indicate cue type and reason): donning socks                     Vision Baseline Vision/History: Legally blind;Wears glasses Wears Glasses: Reading only Patient Visual Report: No change from baseline Additional Comments: blind in L eye (retinal detachment); limited vision in R eye, uses magnifying lens for reading     Perception     Praxis      Pertinent Vitals/Pain Pain Assessment: No/denies pain Faces Pain Scale: No hurt     Hand Dominance Right   Extremity/Trunk Assessment Upper Extremity Assessment Upper Extremity Assessment: Generalized weakness (Limited ROM b/l UE. more significant on L)   Lower Extremity Assessment Lower Extremity Assessment: Generalized weakness;LLE deficits/detail;RLE deficits/detail RLE Deficits / Details: cellulitis RLE Sensation: history of peripheral neuropathy LLE Deficits / Details: tibial fx LLE: Unable to fully assess due to immobilization LLE Sensation: history of peripheral neuropathy       Communication Communication Communication: No difficulties   Cognition Arousal/Alertness: Awake/alert Behavior During Therapy: Restless;Agitated Overall Cognitive Status: No family/caregiver present to determine baseline cognitive functioning                                 General Comments: Difficult to understand pt, given his slight speech impediment and meandering, largely tangential speaking style. Oriented to time and place, but unclear about situation, deficits   General Comments       Exercises Other Exercises Other Exercises: Dressing, grooming, t/t, bed mobility   Shoulder Instructions      Home Living Family/patient expects to be discharged to:: Private residence Living Arrangements: Children Available Help at Discharge: Family;Available PRN/intermittently Type of Home: House Home Access: Stairs to enter [employer] of Steps: 1   Home Layout: One level     Bathroom Shower/Tub: Occupational psychologist: Standard     Home Equipment: Environmental consultant - 2 wheels;Cane - single point;Shower seat          Prior Functioning/Environment Level of Independence: Needs assistance  Gait / Transfers Assistance Needed: RW/cane occasionally for fxl mobility, but reports he primarily uses no AD. ADL's / Homemaking Assistance Needed: INDEP/MOD I with basic ADL like bathing with shower seat. Son helps with IADLs such as cooking/cleaning and driving as pt with low vision at baseline            OT Problem List: Decreased strength;Decreased range of motion;Decreased activity tolerance;Impaired balance (sitting and/or standing);Decreased safety awareness;Decreased knowledge of use of DME or AE      OT Treatment/Interventions: Self-care/ADL training;DME and/or AE instruction;Therapeutic activities;Balance training;Therapeutic exercise;Patient/family education    OT Goals(Current goals can be found in the care plan section) Acute Rehab OT Goals Patient Stated Goal: to get home OT Goal Formulation: With patient Time For Goal Achievement: 04/22/21 Potential to Achieve Goals: Good  OT Frequency: Min 1X/week   Barriers to D/C:            Co-evaluation  AM-PAC OT "6 Clicks" Daily Activity     Outcome Measure Help from another person eating meals?: None Help from another person taking care of personal grooming?: A Little Help from another person toileting, which includes using toliet, bedpan, or urinal?: A Lot Help from another person bathing (including washing, rinsing, drying)?: A Lot Help from another person to put on and taking off regular upper body clothing?: A Little Help from another person to put on and taking off regular lower body clothing?: A Lot 6 Click Score: 16   End of Session Equipment Utilized During Treatment: Rolling walker  Activity Tolerance: Patient  tolerated treatment well Patient left: in bed;with call bell/phone within reach;with bed alarm set;with nursing/sitter in room  OT Visit Diagnosis: Unsteadiness on feet (R26.81);Muscle weakness (generalized) (M62.81)                Time: 0981-1914 OT Time Calculation (min): 33 min Charges:  OT Evaluation $OT Eval Moderate Complexity: 1 Mod OT Treatments $Self Care/Home Management : 23-37 mins  Josiah Lobo, PhD, MS, OTR/L 04/08/21, 10:44 AM

## 2021-04-08 NOTE — Interval H&P Note (Signed)
History and Physical Interval Note:  04/08/2021 2:53 PM  Richard Zavala  has presented today for surgery, with the diagnosis of Atherosclerosis with ulcers.  The various methods of treatment have been discussed with the patient and family. After consideration of risks, benefits and other options for treatment, the patient has consented to  Procedure(s): Lower Extremity Angiography (Right) as a surgical intervention.  The patient's history has been reviewed, patient examined, no change in status, stable for surgery.  I have reviewed the patient's chart and labs.  Questions were answered to the patient's satisfaction.     Hortencia Pilar

## 2021-04-09 ENCOUNTER — Encounter: Payer: Self-pay | Admitting: Vascular Surgery

## 2021-04-09 DIAGNOSIS — I739 Peripheral vascular disease, unspecified: Secondary | ICD-10-CM

## 2021-04-09 DIAGNOSIS — L97512 Non-pressure chronic ulcer of other part of right foot with fat layer exposed: Secondary | ICD-10-CM

## 2021-04-09 LAB — BASIC METABOLIC PANEL
Anion gap: 15 (ref 5–15)
BUN: 69 mg/dL — ABNORMAL HIGH (ref 6–20)
CO2: 23 mmol/L (ref 22–32)
Calcium: 8.3 mg/dL — ABNORMAL LOW (ref 8.9–10.3)
Chloride: 97 mmol/L — ABNORMAL LOW (ref 98–111)
Creatinine, Ser: 6.62 mg/dL — ABNORMAL HIGH (ref 0.61–1.24)
GFR, Estimated: 9 mL/min — ABNORMAL LOW (ref >60)
Glucose, Bld: 50 mg/dL — ABNORMAL LOW (ref 70–99)
Potassium: 5.2 mmol/L — ABNORMAL HIGH (ref 3.5–5.1)
Sodium: 135 mmol/L (ref 135–145)

## 2021-04-09 LAB — GLUCOSE, CAPILLARY
Glucose-Capillary: 86 mg/dL (ref 70–99)
Glucose-Capillary: 93 mg/dL (ref 70–99)
Glucose-Capillary: 162 mg/dL — ABNORMAL HIGH (ref 70–99)
Glucose-Capillary: 167 mg/dL — ABNORMAL HIGH (ref 70–99)

## 2021-04-09 MED ORDER — INSULIN ASPART 100 UNIT/ML ~~LOC~~ SOLN
0.0000 [IU] | Freq: Three times a day (TID) | SUBCUTANEOUS | Status: DC
  Administered 2021-04-09 – 2021-04-10 (×3): 2 [IU] via SUBCUTANEOUS
  Filled 2021-04-09 (×2): qty 1
  Filled 2021-04-09: qty 0.09
  Filled 2021-04-09: qty 1

## 2021-04-09 NOTE — Plan of Care (Signed)

## 2021-04-09 NOTE — Progress Notes (Signed)
[facility]  ROUNDING NOTE   Subjective:    Patient seen sitting at the edge of bed Alert and oriented States the procedure yesterday went well, he thinks  Patient seen later during dialysis   HEMODIALYSIS FLOWSHEET:  Blood Flow Rate (mL/min): 400 mL/min Arterial Pressure (mmHg): -150 mmHg Venous Pressure (mmHg): 200 mmHg Transmembrane Pressure (mmHg): 70 mmHg Ultrafiltration Rate (mL/min): 830 mL/min Dialysate Flow Rate (mL/min): 500 ml/min Conductivity: Machine : 14 Conductivity: Machine : 14 Dialysis Fluid Bolus: Normal Saline Bolus Amount (mL): 250 mL    Objective:  Vital signs in last 24 hours:  Temp:  [97.4 F (36.3 C)-98.2 F (36.8 C)] 97.6 F (36.4 C) (04/20 0813) Pulse Rate:  [57-65] 65 (04/20 0813) Resp:  [10-20] 20 (04/20 0813) BP: (127-201)/(73-98) 147/90 (04/20 0813) SpO2:  [93 %-100 %] 94 % (04/20 0813)  Weight change:  Filed Weights   04/02/21 1755 04/03/21 0130  Weight: 71 kg 73.9 kg    Intake/Output: No intake/output data recorded.   Intake/Output this shift:  No intake/output data recorded.  Physical Exam: General: NAD, sitting on edge of bed  Head: Normocephalic, atraumatic. Moist oral mucosal membranes  Eyes: Anicteric  Lungs:  Clear to auscultation  Heart: Regular rate and rhythm  Abdomen:  Soft, nontender, non distended.   Extremities: UTA peripheral edema, right foot currently guaze dressed, left leg wrapped in surgical dressing,  Neurologic: Nonfocal, moving all four extremities  Skin: Ulcerations to the right foot  Access: Right UE AVF     Basic Metabolic Panel: Recent Labs  Lab 04/02/21 1801 04/03/21 0543 04/07/21 0525 04/08/21 0501 04/09/21 0453  NA 135 138 130*  --  135  K 4.4 4.2 6.0* 5.3* 5.2*  CL 99 101 93*  --  97*  CO2 23 25 21*  --  23  GLUCOSE 216* 55* 155*  --  50*  BUN 39* 49* 82*  --  69*  CREATININE 4.82* 5.30* 7.10*  --  6.62*  CALCIUM 8.3* 8.4* 8.4*  --  8.3*    Liver Function  Tests: Recent Labs  Lab 04/02/21 1801  AST 19  ALT 16  ALKPHOS 119  BILITOT 0.8  PROT 6.3*  ALBUMIN 2.6*   No results for input(s): LIPASE, AMYLASE in the last 168 hours. No results for input(s): AMMONIA in the last 168 hours.  CBC: Recent Labs  Lab 04/02/21 1801 04/03/21 0543 04/07/21 0525  WBC 6.6 5.9 5.5  NEUTROABS 4.9  --   --   HGB 9.4* 9.4* 10.3*  HCT 29.5* 29.6* 31.6*  MCV 98.0 98.7 96.3  PLT 234 211 209    Cardiac Enzymes: No results for input(s): CKTOTAL, CKMB, CKMBINDEX, TROPONINI in the last 168 hours.  BNP: Invalid input(s): POCBNP  CBG: Recent Labs  Lab 04/08/21 1146 04/08/21 1348 04/08/21 1639 04/08/21 2209 04/09/21 0730  GLUCAP 181* 164* 151* 217* 108    Microbiology: Results for orders placed or performed during the hospital encounter of 04/02/21  Culture, blood (routine x 2)     Status: None   Collection Time: 04/02/21  6:01 PM   Specimen: Left Antecubital; Blood  Result Value Ref Range Status   Specimen Description LEFT ANTECUBITAL  Final   Special Requests   Final    BOTTLES DRAWN AEROBIC AND ANAEROBIC Blood Culture results may not be optimal due to an excessive volume of blood received in culture bottles   Culture   Final    NO GROWTH 5 DAYS Performed at [facility]  [facility], [address] [place], [place] [address]    Report Status 04/07/2021 FINAL  Final  Culture, blood (routine x 2)     Status: None   Collection Time: 04/02/21  8:15 PM   Specimen: BLOOD  Result Value Ref Range Status   Specimen Description BLOOD BLOOD LEFT HAND  Final   Special Requests   Final    BOTTLES DRAWN AEROBIC AND ANAEROBIC Blood Culture adequate volume   Culture   Final    NO GROWTH 5 DAYS Performed at [facility], [address] [place], [place] [address]    Report Status 04/07/2021 FINAL  Final  SARS CORONAVIRUS 2 (TAT 6-24 HRS) Nasopharyngeal Nasopharyngeal Swab     Status: None   Collection Time: 04/02/21  8:20 PM   Specimen:  Nasopharyngeal Swab  Result Value Ref Range Status   SARS Coronavirus 2 NEGATIVE NEGATIVE Final    Comment: (NOTE) SARS-CoV-2 target nucleic acids are NOT DETECTED.  The SARS-CoV-2 RNA is generally detectable in upper and lower respiratory specimens during the acute phase of infection. Negative results do not preclude SARS-CoV-2 infection, do not rule out co-infections with other pathogens, and should not be used as the sole basis for treatment or other patient management decisions. Negative results must be combined with clinical observations, patient history, and epidemiological information. The expected result is Negative.  Fact Sheet for Patients: SugarRoll.be  Fact Sheet for Healthcare Providers: https://www.woods-mathews.com/  This test is not yet approved or cleared by the [place] FDA and  has been authorized for detection and/or diagnosis of SARS-CoV-2 by FDA under an Emergency Use Authorization (EUA). This EUA will remain  in effect (meaning this test can be used) for the duration of the COVID-19 declaration under Se ction 564(b)(1) of the Act, 21 U.S.C. section 360bbb-3(b)(1), unless the authorization is terminated or revoked sooner.  Performed at [facility], [place] [address] [place], [place] [address]   Aerobic Culture w Gram Stain (superficial specimen)     Status: None   Collection Time: 04/03/21  2:34 PM   Specimen: Wound  Result Value Ref Range Status   Specimen Description   Final    WOUND Performed at [facility], [address], [place], [place] [address]    Special Requests   Final    LEFT FOOT Performed at [facility], [place], [place] [address]    Gram Stain   Final    RARE WBC PRESENT,BOTH PMN AND MONONUCLEAR RARE GRAM POSITIVE RODS    Culture   Final    RARE STAPHYLOCOCCUS AUREUS WITHIN MIXED ORGANISMS Performed at [facility], [place] [address], [place], [place] [address]    Report Status 04/07/2021 FINAL  Final   Organism ID, Bacteria STAPHYLOCOCCUS AUREUS  Final      Susceptibility   Staphylococcus aureus - MIC*    CIPROFLOXACIN <=0.5 SENSITIVE Sensitive     ERYTHROMYCIN >=8 RESISTANT Resistant     GENTAMICIN <=0.5 SENSITIVE Sensitive     OXACILLIN <=0.25 SENSITIVE Sensitive     TETRACYCLINE <=1 SENSITIVE Sensitive     VANCOMYCIN 1 SENSITIVE Sensitive     TRIMETH/SULFA <=10 SENSITIVE Sensitive     CLINDAMYCIN RESISTANT Resistant     RIFAMPIN <=0.5 SENSITIVE Sensitive     Inducible Clindamycin POSITIVE Resistant     * RARE STAPHYLOCOCCUS AUREUS    Coagulation Studies: No results for input(s): LABPROT, INR in the last 72 hours.  Urinalysis:  No results for input(s): COLORURINE, LABSPEC, [place], GLUCOSEU, HGBUR, BILIRUBINUR, KETONESUR, PROTEINUR, UROBILINOGEN, NITRITE, LEUKOCYTESUR in the last 72 hours.  Invalid input(s): APPERANCEUR    Imaging: PERIPHERAL VASCULAR CATHETERIZATION  Result Date: 04/08/2021 See op note    Medications:   . sodium chloride     . aspirin EC  81 mg Oral Daily  . atorvastatin  80 mg Oral QHS  . carvedilol  12.5 mg Oral BID WC  . Chlorhexidine Gluconate Cloth  6 each Topical Q0600  . clopidogrel  75 mg Oral Daily  . doxycycline  100 mg Oral Q12H  . furosemide  80 mg Oral Q T,Th,S,Su  . heparin injection (subcutaneous)  5,000 Units Subcutaneous Q12H  . insulin aspart  0-15 Units Subcutaneous TID PC & HS  . lidocaine  1 patch Transdermal Daily  . pantoprazole  40 mg Oral BID AC  . sodium chloride flush  3 mL Intravenous Q12H   sodium chloride, acetaminophen **OR** acetaminophen, acetaminophen, albuterol, cyclobenzaprine, docusate sodium, hydrALAZINE, HYDROmorphone (DILAUDID) injection, labetalol, magnesium hydroxide, morphine injection, morphine injection, ondansetron (ZOFRAN) IV, ondansetron (ZOFRAN) IV, oxyCODONE, oxyCODONE, sodium chloride flush, traZODone  Assessment/ Plan:   [name] is a 61 y.o.  male with past medical history of ESRD on hemodialysis, anemia, diabetes, CAD, CHF, pulmonary hypertension and osteoporosis. He presents to the ED with leg swelling found to be cellulitis.   [facility] MWF  Rt lower AVF 72kg  1. ESRD on HD - Scheduled for dialysis today - UF Goal 1.5L  - Next treatment on Friday  2. Anemia of CKD : hemoglobin 10.3 - EPO as outpatient.  - Hgb at goal  3. Secondary hyperparathyroidism of renal origin  Not currently on a binder.   4. Hypertension:  - Elevated - continue carvedilol and furosemide.  - PRN Hydralazine and Labetalol available    LOS: 7   4/20/20229:42 AM

## 2021-04-09 NOTE — Progress Notes (Signed)
PAD taken from the access site. No sign of bleeding, covered with pressure gauge.

## 2021-04-09 NOTE — Progress Notes (Signed)
Inpatient Diabetes Program Recommendations  AACE/ADA: New Consensus Statement on Inpatient Glycemic Control (2015)  Target Ranges:  Prepandial:   less than 140 mg/dL      Peak postprandial:   less than 180 mg/dL (1-2 hours)      Critically ill patients:  140 - 180 mg/dL   Lab Results  Component Value Date   GLUCAP 86 04/09/2021   HGBA1C 7.8 (H) 03/21/2021    Review of Glycemic Control  Diabetes history:DM 2 Outpatient Diabetes medications: Lantus 9-10 units daily Current orders for Inpatient glycemic control: Novolog moderate tid with meals and HS   Inpatient Diabetes Program Recommendations: Please consider reducing Novolog correction to sensitive tid with meals and use HS scale starting at 201 mg/dL.   Thank you, Nani Gasser. Jaylnn Ullery, RN, MSN, CDE  Diabetes Coordinator Inpatient Glycemic Control Team Team Pager [phone] (8am-5pm) 04/09/2021 10:51 AM

## 2021-04-09 NOTE — Progress Notes (Signed)
PROGRESS NOTE    Richard Zavala  WIO:[id number] DOB: 10/31/1961 DOA: 04/02/2021 PCP: Midge Minium, PA    Brief Narrative:  Richard Zavala is a 60 y.o. Caucasian male with medical history significant for multiple medical problems that are mentioned below including CHF, coronary artery disease, end-stage renal disease on hemodialysis, hypertension, dyslipidemia, peripheral neuropathy, CVA and BPH, who presented to the ER with acute onset of worsening right foot swelling with erythema and mild extension to the right leg for last 4 days.  The patient had ORIF of the left leg during recent admission here and was discharged on 03/25/2021.  He has a right foot ulcer and scattered healed ulcers over his foot and leg.  He denied any fever or chills.  He has no problems currently from his left leg.  Earlier today he finished his session of hemodialysis that he gets on Monday, Wednesday and Fridays.  No chest pain or palpitations.  No reported cough or wheezing or dyspnea.  ED Course: Upon presentation to the ER, temperature was 100 with a blood pressure 137/99 with otherwise normal vital signs.  Labs revealed blood glucose of 216 and a BUN of 39 with creatinine 4.82 with anion gap of 13.  Albumin was 2.6.  Lactic acid was 1.1.  CBC showed anemia close to his baseline.  Blood culture was drawn.  4/14- no overnight issues. Saw pt this am. In the after noon nsg stated pts bp 81/68, repeat with manual 153/71.  Was planning to give bolus and midodrine, but d/c'd since repeat with manual was stable.  4/15-found with two chocolate bars son brought to him. Refused insulin. Discussed good glycemic control, but appears not to care what I am trying to discuss about it  4/16-sunkist regular at bedside. Pt c/o why HD did not take a lot of fluid from him, he wanted more fluid taken out but they refused. No other issues  4/17 c/o back spasm. Lots iv line multiple times.  4/18 seen in hemodialysis.  Tolerating.   Complaining that he wants more fluid to be taken off. 4/19-plan for angiography today by vascular  Consultants:   ID, vascular surgery , nephrology                               Procedures:  Antimicrobials:   Ciprofloxacin and doxycycline   Subjective: Denies any chest pain, shortness of breath, no fever or chills.  Objective: Vitals:   04/09/21 1145 04/09/21 1200 04/09/21 1215 04/09/21 1230  BP: (!) 149/58 (!) 149/59 119/88 139/77  Pulse: 65 65 66 73  Resp: 13 11 13 18   Temp:      TempSrc:      SpO2:      Weight:      Height:        Intake/Output Summary (Last 24 hours) at 04/09/2021 1311 Last data filed at 04/09/2021 1230 Gross per 24 hour  Intake --  Output 2000 ml  Net -2000 ml   Filed Weights   04/02/21 1755 04/03/21 0130  Weight: 71 kg 73.9 kg    Examination:  Awake Alert, Oriented X 3, No new F.N deficits, Normal affect Symmetrical Chest wall movement, Good air movement bilaterally, CTAB RRR,No Gallops,Rubs or new Murmurs, No Parasternal Heave +ve B.Sounds, Abd Soft, No tenderness, No rebound - guarding or rigidity. No Cyanosis, Clubbing or edema, lower extremity bandaged.  Data Reviewed: I have personally reviewed following labs  and imaging studies  CBC: Recent Labs  Lab 04/02/21 1801 04/03/21 0543 04/07/21 0525  WBC 6.6 5.9 5.5  NEUTROABS 4.9  --   --   HGB 9.4* 9.4* 10.3*  HCT 29.5* 29.6* 31.6*  MCV 98.0 98.7 96.3  PLT 234 211 789   Basic Metabolic Panel: Recent Labs  Lab 04/02/21 1801 04/03/21 0543 04/07/21 0525 04/08/21 0501 04/09/21 0453  NA 135 138 130*  --  135  K 4.4 4.2 6.0* 5.3* 5.2*  CL 99 101 93*  --  97*  CO2 23 25 21*  --  23  GLUCOSE 216* 55* 155*  --  50*  BUN 39* 49* 82*  --  69*  CREATININE 4.82* 5.30* 7.10*  --  6.62*  CALCIUM 8.3* 8.4* 8.4*  --  8.3*   GFR: Estimated Creatinine Clearance: 11.8 mL/min (A) (by C-G formula based on SCr of 6.62 mg/dL (H)). Liver Function Tests: Recent Labs  Lab 04/02/21 1801   AST 19  ALT 16  ALKPHOS 119  BILITOT 0.8  PROT 6.3*  ALBUMIN 2.6*   No results for input(s): LIPASE, AMYLASE in the last 168 hours. No results for input(s): AMMONIA in the last 168 hours. Coagulation Profile: Recent Labs  Lab 04/03/21 0543  INR 1.4*   Cardiac Enzymes: No results for input(s): CKTOTAL, CKMB, CKMBINDEX, TROPONINI in the last 168 hours. BNP (last 3 results) No results for input(s): PROBNP in the last 8760 hours. HbA1C: No results for input(s): HGBA1C in the last 72 hours. CBG: Recent Labs  Lab 04/08/21 1146 04/08/21 1348 04/08/21 1639 04/08/21 2209 04/09/21 0730  GLUCAP 181* 164* 151* 217* 86   Lipid Profile: No results for input(s): CHOL, HDL, LDLCALC, TRIG, CHOLHDL, LDLDIRECT in the last 72 hours. Thyroid Function Tests: No results for input(s): TSH, T4TOTAL, FREET4, T3FREE, THYROIDAB in the last 72 hours. Anemia Panel: No results for input(s): VITAMINB12, FOLATE, FERRITIN, TIBC, IRON, RETICCTPCT in the last 72 hours. Sepsis Labs: Recent Labs  Lab 04/02/21 1801  LATICACIDVEN 1.1    Recent Results (from the past 240 hour(s))  Culture, blood (routine x 2)     Status: None   Collection Time: 04/02/21  6:01 PM   Specimen: Left Antecubital; Blood  Result Value Ref Range Status   Specimen Description LEFT ANTECUBITAL  Final   Special Requests   Final    BOTTLES DRAWN AEROBIC AND ANAEROBIC Blood Culture results may not be optimal due to an excessive volume of blood received in culture bottles   Culture   Final    NO GROWTH 5 DAYS Performed at [facility], [address], [place], [place] [address]    Report Status 04/07/2021 FINAL  Final  Culture, blood (routine x 2)     Status: None   Collection Time: 04/02/21  8:15 PM   Specimen: BLOOD  Result Value Ref Range Status   Specimen Description BLOOD BLOOD LEFT HAND  Final   Special Requests   Final    BOTTLES DRAWN AEROBIC AND ANAEROBIC Blood Culture adequate volume   Culture   Final     NO GROWTH 5 DAYS Performed at [facility], [address], [place],  [address]    Report Status 04/07/2021 FINAL  Final  SARS CORONAVIRUS 2 (TAT 6-24 HRS) Nasopharyngeal Nasopharyngeal Swab     Status: None   Collection Time: 04/02/21  8:20 PM   Specimen: Nasopharyngeal Swab  Result Value Ref Range Status   SARS Coronavirus 2 NEGATIVE NEGATIVE  Final    Comment: (NOTE) SARS-CoV-2 target nucleic acids are NOT DETECTED.  The SARS-CoV-2 RNA is generally detectable in upper and lower respiratory specimens during the acute phase of infection. Negative results do not preclude SARS-CoV-2 infection, do not rule out co-infections with other pathogens, and should not be used as the sole basis for treatment or other patient management decisions. Negative results must be combined with clinical observations, patient history, and epidemiological information. The expected result is Negative.  Fact Sheet for Patients: SugarRoll.be  Fact Sheet for Healthcare Providers: https://www.woods-mathews.com/  This test is not yet approved or cleared by the [place] FDA and  has been authorized for detection and/or diagnosis of SARS-CoV-2 by FDA under an Emergency Use Authorization (EUA). This EUA will remain  in effect (meaning this test can be used) for the duration of the COVID-19 declaration under Se ction 564(b)(1) of the Act, 21 U.S.C. section 360bbb-3(b)(1), unless the authorization is terminated or revoked sooner.  Performed at [facility], [place] [address], [place], [place] [address]   Aerobic Culture w Gram Stain (superficial specimen)     Status: None   Collection Time: 04/03/21  2:34 PM   Specimen: Wound  Result Value Ref Range Status   Specimen Description   Final    WOUND Performed at [facility], [address], [place], [place] [address]    Special Requests   Final    LEFT FOOT Performed at [facility], [place], [place] [address]    Gram Stain   Final    RARE WBC PRESENT,BOTH PMN AND MONONUCLEAR RARE GRAM POSITIVE RODS    Culture   Final    RARE STAPHYLOCOCCUS AUREUS WITHIN MIXED ORGANISMS Performed at [facility], [place] [address], [place], [place] [address]    Report Status 04/07/2021 FINAL  Final   Organism ID, Bacteria STAPHYLOCOCCUS AUREUS  Final      Susceptibility   Staphylococcus aureus - MIC*    CIPROFLOXACIN <=0.5 SENSITIVE Sensitive     ERYTHROMYCIN >=8 RESISTANT Resistant     GENTAMICIN <=0.5 SENSITIVE Sensitive     OXACILLIN <=0.25 SENSITIVE Sensitive     TETRACYCLINE <=1 SENSITIVE Sensitive     VANCOMYCIN 1 SENSITIVE Sensitive     TRIMETH/SULFA <=10 SENSITIVE Sensitive     CLINDAMYCIN RESISTANT Resistant     RIFAMPIN <=0.5 SENSITIVE Sensitive     Inducible Clindamycin POSITIVE Resistant     * RARE STAPHYLOCOCCUS AUREUS         Radiology Studies: PERIPHERAL VASCULAR CATHETERIZATION  Result Date: 04/08/2021 See op note       Scheduled Meds: . aspirin EC  81 mg Oral Daily  . atorvastatin  80 mg Oral QHS  . carvedilol  12.5 mg Oral BID WC  . Chlorhexidine Gluconate Cloth  6 each Topical Q0600  . clopidogrel  75 mg Oral Daily  . doxycycline  100 mg Oral Q12H  . furosemide  80 mg Oral Q T,Th,S,Su  . heparin injection (subcutaneous)  5,000 Units Subcutaneous Q12H  . insulin aspart  0-9 Units Subcutaneous TID WC  . lidocaine  1 patch Transdermal Daily  . pantoprazole  40 mg Oral BID AC  . sodium chloride flush  3 mL Intravenous Q12H   Continuous Infusions: . sodium chloride      Assessment & Plan:   Active Problems:   Cellulitis in diabetic foot ([place])   Diabetic right foot, with chronic wounds, with moderate surrounding  cellulitis/infected ulcers. -With underlying peripheral vascular disease. -Vascular surgery input greatly appreciated, s/p angiography, and stent-balloon revascularization  4/19.. -Empirically on vancomycin and Zosyn, antibiotic management per ID, currently narrowed to doxycycline. -Continue with local wound care. -Podiatry consulted input was appreciated ,    Type II diabetes mellitus with mild hyperglycemia. Patient refusing insulin 4/16 noncompliant with diet Discussed glycemic control with the patient for better wound healing Only on R ISS as he was refusing Lantus 4/19-BG stable Continue with RISS  End-stage renal disease on hemodialysis. Hyperkalemia -renal input Greatly appreciated, managed with dialysis  Dyslipidemia. Continue statin  Essential hypertension. Continue Norvasc  GERD. Continue Pepcid  Coronary artery disease. -We will continue beta-blocker therapy, Imdur and aspirin as well as statin therapy as tolerated  PT/OT  DVT prophylaxis: heparin Code Status:full Family Communication: none at bedside  Status is: Inpatient  The patient remains inpatient due to severity of illness and requiring IV treatment.  Dispo: The patient is from:home              Anticipated d/c is to: SNF              Patient currently not medically stable   Difficult to place patient:no            LOS: 7 days     Phillips Climes, MD [facility]  If 7PM-7AM, please contact night-coverage 04/09/2021, 1:11 PM

## 2021-04-09 NOTE — Progress Notes (Signed)
  Subjective:  Patient ID: Richard Zavala, male    DOB: 04-06-1961,  MRN: [id number]  Underwent revascularization yesterday. No pain in foot. Sleeping  Negative for chest pain and shortness of breath Fever: no Constitutional signs: no Objective:   Vitals:   04/09/21 1230 04/09/21 1552  BP: 139/77 (!) 148/87  Pulse: 73 73  Resp: 18 18  Temp:  98.7 F (37.1 C)  SpO2:  94%   General AA&O x3. Normal mood and affect.  Vascular Foot warm and well perfused now with capillary fill time intact  Neurologic Epicritic sensation grossly reduced  Dermatologic Dorsal foot wound stable, no cellulitis or purulence  Orthopedic: MMT 5/5 RLE    Assessment & Plan:  Patient was evaluated and treated and all questions answered.  Right lower extremity wound -1 more week doxycycline per ID -Would continue local wound care at discharge with mupirocin and foam bordered dressing -Already established at [facility] wound care center, f/u with Dr Dellia Nims there -No surgical plans from our service -Will sign off   Criselda Peaches, DPM  Accessible via secure chat for questions or concerns.

## 2021-04-09 NOTE — TOC Progression Note (Addendum)
Transition of Care [facility]) - Progression Note    Patient Details  Name: Richard Zavala MRN: [id number] Date of Birth: [birth year]  Transition of [facility]) CM/SW Contact  Beverly Sessions, RN Phone Number: 04/09/2021, 12:40 PM  Clinical Narrative:    Patient currently in HD Reached out to Son to confirm their wishes were for [facility] at discharge.  He confirms  Reached out to MD to determine anticipated DC date.  Patient will require insurance auth prior to discharge    Update:  Per MD anticipated DC tomorrow.  Auth started in portal  reference number [id number]. Magda Paganini at [facility] updated. MD to order covid test   Expected Discharge Plan: Skilled Nursing Facility Barriers to Discharge: Continued Medical Work up  Expected Discharge Plan and Services Expected Discharge Plan: [place]       Living arrangements for the past 2 months: Single Family Home                                       Social Determinants of Health (SDOH) Interventions    Readmission Risk Interventions Readmission Risk Prevention Plan 04/06/2021 02/14/2021 02/09/2021  Transportation Screening Complete Complete -  PCP or Specialist Appt within 3-5 Days - - -  [place]RI or [place] Work Consult for [place] Planning/Counseling - - Patient refused  Palliative Care Screening - - Patient Refused  Palliative Care Screening Not Complete Comments - - (No Data)  Medication Review Press photographer) Complete Complete Referral to Pharmacy  PCP or Specialist appointment within 3-5 days of discharge Complete Complete -  [place] or Home Care Consult Complete Patient refused -  SW Recovery Care/Counseling Consult Complete Patient refused -  Palliative Care Screening Complete Not Applicable -  [place] Complete Not Applicable -  Some recent data might be hidden

## 2021-04-09 NOTE — Progress Notes (Signed)
Covid test sent to the lab.

## 2021-04-09 NOTE — Progress Notes (Signed)
ID brief note S/p revascularization yest Would rec just 7 more days oral doxy 100 bid. Will need fu with vascular and or podiatry to ensure ulcers improving.  No need for ID fu unless develops resistant organism. I will sign off. Please call with questions

## 2021-04-10 ENCOUNTER — Encounter: Payer: Medicare Other | Admitting: Physical Therapy

## 2021-04-10 DIAGNOSIS — L97512 Non-pressure chronic ulcer of other part of right foot with fat layer exposed: Secondary | ICD-10-CM

## 2021-04-10 LAB — GLUCOSE, CAPILLARY
Glucose-Capillary: 180 mg/dL — ABNORMAL HIGH (ref 70–99)
Glucose-Capillary: 182 mg/dL — ABNORMAL HIGH (ref 70–99)

## 2021-04-10 LAB — SARS CORONAVIRUS 2 (TAT 6-24 HRS): SARS Coronavirus 2: NEGATIVE

## 2021-04-10 MED ORDER — INSULIN GLARGINE 100 UNIT/ML ~~LOC~~ SOLN: 4.0000 [IU] | Freq: Every day | SUBCUTANEOUS | 11 refills | Status: AC

## 2021-04-10 MED ORDER — CLOPIDOGREL BISULFATE 75 MG PO TABS: 75.0000 mg | ORAL_TABLET | Freq: Every day | ORAL | Status: AC

## 2021-04-10 MED ORDER — OXYCODONE HCL 5 MG PO TABS: 5.0000 mg | ORAL_TABLET | Freq: Four times a day (QID) | ORAL | 0 refills | Status: AC | PRN

## 2021-04-10 MED ORDER — FUROSEMIDE 80 MG PO TABS: 80.0000 mg | ORAL_TABLET | ORAL | Status: AC

## 2021-04-10 MED ORDER — DOXYCYCLINE HYCLATE 100 MG PO TABS: 100.0000 mg | ORAL_TABLET | Freq: Two times a day (BID) | ORAL | 0 refills | Status: AC

## 2021-04-10 MED ORDER — INSULIN ASPART 100 UNIT/ML ~~LOC~~ SOLN: 0.0000 [IU] | Freq: Three times a day (TID) | SUBCUTANEOUS | 11 refills | Status: AC

## 2021-04-10 NOTE — Discharge Instructions (Signed)
Follow with Primary MD Midge Minium, PA /SNF physician  Get CBC, CMP, 2 view Chest X ray checked  by Primary MD next visit.    Activity: Nonweight bearing on left lower extremity   Disposition SNF   Diet: Diet renal/carb modified with 1200 cc fluid restrictions   On your next visit with your primary care physician please Get Medicines reviewed and adjusted.   Please request your Prim.MD to go over all Hospital Tests and Procedure/Radiological results at the follow up, please get all Hospital records sent to your Prim MD by signing hospital release before you go home.   If you experience worsening of your admission symptoms, develop shortness of breath, life threatening emergency, suicidal or homicidal thoughts you must seek medical attention immediately by calling 911 or calling your MD immediately  if symptoms less severe.  You Must read complete instructions/literature along with all the possible adverse reactions/side effects for all the Medicines you take and that have been prescribed to you. Take any new Medicines after you have completely understood and accpet all the possible adverse reactions/side effects.   Do not drive, operating heavy machinery, perform activities at heights, swimming or participation in water activities or provide baby sitting services if your were admitted for syncope or siezures until you have seen by Primary MD or a Neurologist and advised to do so again.  Do not drive when taking Pain medications.    Do not take more than prescribed Pain, Sleep and Anxiety Medications  Special Instructions: If you have smoked or chewed Tobacco  in the last 2 yrs please stop smoking, stop any regular Alcohol  and or any Recreational drug use.  Wear Seat belts while driving.   Please note  You were cared for by a hospitalist during your hospital stay. If you have any questions about your discharge medications or the care you received while you were in the  hospital after you are discharged, you can call the unit and asked to speak with the hospitalist on call if the hospitalist that took care of you is not available. Once you are discharged, your primary care physician will handle any further medical issues. Please note that NO REFILLS for any discharge medications will be authorized once you are discharged, as it is imperative that you return to your primary care physician (or establish a relationship with a primary care physician if you do not have one) for your aftercare needs so that they can reassess your need for medications and monitor your lab values.

## 2021-04-10 NOTE — Progress Notes (Signed)
[facility]  ROUNDING NOTE   Subjective:    Patient seen sitting at the edge of bed Alert and oriented No complaints at this time    Objective:  Vital signs in last 24 hours:  Temp:  [97.5 F (36.4 C)-99 F (37.2 C)] 98.7 F (37.1 C) (04/21 0846) Pulse Rate:  [63-73] 67 (04/21 0846) Resp:  [11-18] 18 (04/21 0846) BP: (73-166)/(57-108) 153/108 (04/21 0846) SpO2:  [92 %-100 %] 97 % (04/21 0846)  Weight change:  Filed Weights   04/02/21 1755 04/03/21 0130  Weight: 71 kg 73.9 kg    Intake/Output: I/O last 3 completed shifts: In: 243 [P.O.:240; I.V.:3] Out: 2000 [Other:2000]   Intake/Output this shift:  No intake/output data recorded.  Physical Exam: General: NAD, sitting on edge of bed  Head: Normocephalic, atraumatic. Moist oral mucosal membranes  Eyes: Anicteric  Lungs:  Clear to auscultation  Heart: Regular rate and rhythm  Abdomen:  Soft, nontender, non distended.   Extremities: UTA peripheral edema, right foot currently guaze dressed, left leg wrapped in surgical dressing,  Neurologic: Nonfocal, moving all four extremities  Skin: Ulcerations to the right foot  Access: Right UE AVF     Basic Metabolic Panel: Recent Labs  Lab 04/07/21 0525 04/08/21 0501 04/09/21 0453  NA 130*  --  135  K 6.0* 5.3* 5.2*  CL 93*  --  97*  CO2 21*  --  23  GLUCOSE 155*  --  50*  BUN 82*  --  69*  CREATININE 7.10*  --  6.62*  CALCIUM 8.4*  --  8.3*    Liver Function Tests: No results for input(s): AST, ALT, ALKPHOS, BILITOT, PROT, ALBUMIN in the last 168 hours. No results for input(s): LIPASE, AMYLASE in the last 168 hours. No results for input(s): AMMONIA in the last 168 hours.  CBC: Recent Labs  Lab 04/07/21 0525  WBC 5.5  HGB 10.3*  HCT 31.6*  MCV 96.3  PLT 209    Cardiac Enzymes: No results for input(s): CKTOTAL, CKMB, CKMBINDEX, TROPONINI in the last 168 hours.  BNP: Invalid input(s): POCBNP  CBG: Recent Labs  Lab 04/09/21 0730  04/09/21 1324 04/09/21 1704 04/09/21 2057 04/10/21 0734  GLUCAP 86 93 162* 167* 180*    Microbiology: Results for orders placed or performed during the hospital encounter of 04/02/21  Culture, blood (routine x 2)     Status: None   Collection Time: 04/02/21  6:01 PM   Specimen: Left Antecubital; Blood  Result Value Ref Range Status   Specimen Description LEFT ANTECUBITAL  Final   Special Requests   Final    BOTTLES DRAWN AEROBIC AND ANAEROBIC Blood Culture results may not be optimal due to an excessive volume of blood received in culture bottles   Culture   Final    NO GROWTH 5 DAYS Performed at [facility], [address], [place], [place] [address]    Report Status 04/07/2021 FINAL  Final  Culture, blood (routine x 2)     Status: None   Collection Time: 04/02/21  8:15 PM   Specimen: BLOOD  Result Value Ref Range Status   Specimen Description BLOOD BLOOD LEFT HAND  Final   Special Requests   Final    BOTTLES DRAWN AEROBIC AND ANAEROBIC Blood Culture adequate volume   Culture   Final    NO GROWTH 5 DAYS Performed at [facility], [address] [place], [place] [address]    Report Status 04/07/2021 FINAL  Final  SARS CORONAVIRUS 2 (TAT 6-24 HRS) Nasopharyngeal Nasopharyngeal Swab     Status: None   Collection Time: 04/02/21  8:20 PM   Specimen: Nasopharyngeal Swab  Result Value Ref Range Status   SARS Coronavirus 2 NEGATIVE NEGATIVE Final    Comment: (NOTE) SARS-CoV-2 target nucleic acids are NOT DETECTED.  The SARS-CoV-2 RNA is generally detectable in upper and lower respiratory specimens during the acute phase of infection. Negative results do not preclude SARS-CoV-2 infection, do not rule out co-infections with other pathogens, and should not be used as the sole basis for treatment or other patient management decisions. Negative results must be combined with clinical observations, patient history, and epidemiological information. The  expected result is Negative.  Fact Sheet for Patients: SugarRoll.be  Fact Sheet for Healthcare Providers: https://www.woods-mathews.com/  This test is not yet approved or cleared by the [place] FDA and  has been authorized for detection and/or diagnosis of SARS-CoV-2 by FDA under an Emergency Use Authorization (EUA). This EUA will remain  in effect (meaning this test can be used) for the duration of the COVID-19 declaration under Se ction 564(b)(1) of the Act, 21 U.S.C. section 360bbb-3(b)(1), unless the authorization is terminated or revoked sooner.  Performed at [facility], [place] [address], [place], [place] [address]   Aerobic Culture w Gram Stain (superficial specimen)     Status: None   Collection Time: 04/03/21  2:34 PM   Specimen: Wound  Result Value Ref Range Status   Specimen Description   Final    WOUND Performed at [facility], [address], [place], [place] [address]    Special Requests   Final    LEFT FOOT Performed at [facility], [place], [place] [address]    Gram Stain   Final    RARE WBC PRESENT,BOTH PMN AND MONONUCLEAR RARE GRAM POSITIVE RODS    Culture   Final    RARE STAPHYLOCOCCUS AUREUS WITHIN MIXED ORGANISMS Performed at [facility], [place] [address] [place] [address], [place], [place] [address]    Report Status 04/07/2021 FINAL  Final   Organism ID, Bacteria STAPHYLOCOCCUS AUREUS  Final      Susceptibility   Staphylococcus aureus - MIC*    CIPROFLOXACIN <=0.5 SENSITIVE Sensitive     ERYTHROMYCIN >=8 RESISTANT Resistant     GENTAMICIN <=0.5 SENSITIVE Sensitive     OXACILLIN <=0.25 SENSITIVE Sensitive     TETRACYCLINE <=1 SENSITIVE Sensitive     VANCOMYCIN 1 SENSITIVE Sensitive     TRIMETH/SULFA <=10 SENSITIVE Sensitive     CLINDAMYCIN RESISTANT Resistant     RIFAMPIN <=0.5 SENSITIVE Sensitive     Inducible Clindamycin POSITIVE Resistant     * RARE STAPHYLOCOCCUS  AUREUS  SARS CORONAVIRUS 2 (TAT 6-24 HRS) Nasopharyngeal Nasopharyngeal Swab     Status: None   Collection Time: 04/09/21  3:15 PM   Specimen: Nasopharyngeal Swab  Result Value Ref Range Status   SARS Coronavirus 2 NEGATIVE NEGATIVE Final    Comment: (NOTE) SARS-CoV-2 target nucleic acids are NOT DETECTED.  The SARS-CoV-2 RNA is generally detectable in upper and lower respiratory specimens during the acute phase of infection. Negative results do not preclude SARS-CoV-2 infection, do not rule out co-infections with other pathogens, and should not be used as the sole basis for treatment or other patient management decisions. Negative results must be combined with clinical observations, patient history, and epidemiological information. The expected result is Negative.  Fact Sheet for Patients:  SugarRoll.be  Fact Sheet for Healthcare Providers: https://www.woods-mathews.com/  This test is not yet approved or cleared by the [place] FDA and  has been authorized for detection and/or diagnosis of SARS-CoV-2 by FDA under an Emergency Use Authorization (EUA). This EUA will remain  in effect (meaning this test can be used) for the duration of the COVID-19 declaration under Se ction 564(b)(1) of the Act, 21 U.S.C. section 360bbb-3(b)(1), unless the authorization is terminated or revoked sooner.  Performed at [facility], [place] [address] [place], [place] [address]     Coagulation Studies: No results for input(s): LABPROT, INR in the last 72 hours.  Urinalysis: No results for input(s): COLORURINE, LABSPEC, PHURINE, GLUCOSEU, HGBUR, BILIRUBINUR, KETONESUR, PROTEINUR, UROBILINOGEN, NITRITE, LEUKOCYTESUR in the last 72 hours.  Invalid input(s): APPERANCEUR    Imaging: PERIPHERAL VASCULAR CATHETERIZATION  Result Date: 04/08/2021 See op note    Medications:   . sodium chloride     . aspirin EC  81 mg Oral Daily  .  atorvastatin  80 mg Oral QHS  . carvedilol  12.5 mg Oral BID WC  . Chlorhexidine Gluconate Cloth  6 each Topical Q0600  . clopidogrel  75 mg Oral Daily  . doxycycline  100 mg Oral Q12H  . furosemide  80 mg Oral Q T,Th,S,Su  . heparin injection (subcutaneous)  5,000 Units Subcutaneous Q12H  . insulin aspart  0-9 Units Subcutaneous TID WC  . lidocaine  1 patch Transdermal Daily  . pantoprazole  40 mg Oral BID AC  . sodium chloride flush  3 mL Intravenous Q12H   sodium chloride, acetaminophen, albuterol, cyclobenzaprine, docusate sodium, hydrALAZINE, HYDROmorphone (DILAUDID) injection, labetalol, magnesium hydroxide, ondansetron (ZOFRAN) IV, ondansetron (ZOFRAN) IV, oxyCODONE, sodium chloride flush, traZODone  Assessment/ Plan:  Richard Zavala is a 60 y.o.  male with past medical history of ESRD on hemodialysis, anemia, diabetes, CAD, CHF, pulmonary hypertension and osteoporosis. He presents to the ED with leg swelling found to be cellulitis.   [facility] MWF  Rt lower AVF 72kg  1. ESRD on HD - Received dialysis yesterday - UF Goal 2L acheived - Next treatment on Friday   2. Anemia of CKD : hemoglobin 10.3 - EPO as outpatient.  - Hgb at goal  3. Secondary hyperparathyroidism of renal origin  Not currently on a binder.   4. Hypertension:  - Elevated - continue carvedilol and furosemide.  - PRN Hydralazine and Labetalol available    LOS: 8   4/21/202210:07 AM

## 2021-04-10 NOTE — TOC Progression Note (Addendum)
Transition of Care [facility]) - Progression Note    Patient Details  Name: Richard Zavala MRN: [id number] Date of Birth: 1961-11-28  Transition of Care [facility]) CM/SW Contact  Beverly Sessions, RN Phone Number: 04/10/2021, 9:47 AM  Clinical Narrative:    Josem Kaufmann obtained for SNF [id number] valid through 4/25 Repeat covid test negative   Expected Discharge Plan: [place] Barriers to Discharge: Continued Medical Work up  Expected Discharge Plan and Services Expected Discharge Plan: [place] arrangements for the past 2 months: Single Family Home                                       Social Determinants of Health (SDOH) Interventions    Readmission Risk Interventions Readmission Risk Prevention Plan 04/06/2021 02/14/2021 02/09/2021  Transportation Screening Complete Complete -  PCP or Specialist Appt within 3-5 Days - - -  [place] or [place] Work Consult for [place] Planning/Counseling - - Patient refused  Palliative Care Screening - - Patient Refused  Palliative Care Screening Not Complete Comments - - (No Data)  Medication Review Press photographer) Complete Complete Referral to Pharmacy  PCP or Specialist appointment within 3-5 days of discharge Complete Complete -  [place] or [place] Complete Patient refused -  SW Recovery Care/Counseling Consult Complete Patient refused -  Palliative Care Screening Complete Not Applicable -  [place] Complete Not Applicable -  Some recent data might be hidden

## 2021-04-10 NOTE — Plan of Care (Signed)
Axox4. Calm and cooperative and able to voice his needs. No complaints of pain, no NV. Prn Labetalol adm for SBP > 160, effective. Safety measures in place. Will continue to monitor.  Problem: Education: Goal: Knowledge of General Education information will improve Description: Including pain rating scale, medication(s)/side effects and non-pharmacologic comfort measures Outcome: Progressing   Problem: Health Behavior/Discharge Planning: Goal: Ability to manage health-related needs will improve Outcome: Progressing   Problem: Clinical Measurements: Goal: Ability to maintain clinical measurements within normal limits will improve Outcome: Progressing Goal: Will remain free from infection Outcome: Progressing Goal: Diagnostic test results will improve Outcome: Progressing Goal: Respiratory complications will improve Outcome: Progressing Goal: Cardiovascular complication will be avoided Outcome: Progressing   Problem: Activity: Goal: Risk for activity intolerance will decrease Outcome: Progressing   Problem: Nutrition: Goal: Adequate nutrition will be maintained Outcome: Progressing   Problem: Coping: Goal: Level of anxiety will decrease Outcome: Progressing   Problem: Elimination: Goal: Will not experience complications related to bowel motility Outcome: Progressing Goal: Will not experience complications related to urinary retention Outcome: Progressing   Problem: Pain Managment: Goal: General experience of comfort will improve Outcome: Progressing   Problem: Safety: Goal: Ability to remain free from injury will improve Outcome: Progressing   Problem: Skin Integrity: Goal: Risk for impaired skin integrity will decrease Outcome: Progressing

## 2021-04-10 NOTE — Progress Notes (Signed)
Pt discharged to [facility] via EMS. AVS reviewed and pt verbalized understanding of all instructions. NAD noted prior to discharge and no concerns voiced.

## 2021-04-10 NOTE — TOC Transition Note (Signed)
Transition of Care [facility]) - CM/SW Discharge Note   Patient Details  Name: Richard Zavala MRN: [id number] Date of Birth: 12/06/1961  [facility] Care [facility]) CM/SW Contact:  Beverly Sessions, RN Phone Number: 04/10/2021, 1:52 PM   Clinical Narrative:    Patient to [facility] to [facility]  DC info sent in [place] Bedside RN to call report Repeat covid test negative EMS packet on the chart with signed controlled substance script EMS transport called    Final next level of care: Skilled Nursing Facility Barriers to Discharge: No Barriers Identified   Patient Goals and CMS Choice Patient states their goals for this hospitalization and ongoing recovery are:: SNF rehab CMS Medicare.gov Compare Post Acute Care list provided to:: Patient Choice offered to / list presented to : Patient  Discharge Placement              Patient chooses bed at: [facility] Patient to be transferred to facility by: EMS      Discharge Plan and Services                                     Social Determinants of Health (SDOH) Interventions     Readmission Risk Interventions Readmission Risk Prevention Plan 04/06/2021 02/14/2021 02/09/2021  Transportation Screening Complete Complete -  PCP or Specialist Appt within 3-5 Days - - -  [place] or [place] Work Consult for [place] Planning/Counseling - - Patient refused  Palliative Care Screening - - Patient Refused  Palliative Care Screening Not Complete Comments - - (No Data)  Medication Review Press photographer) Complete Complete Referral to Pharmacy  PCP or Specialist appointment within 3-5 days of discharge Complete Complete -  [place] or Home Care Consult Complete Patient refused -  SW Recovery Care/Counseling Consult Complete Patient refused -  Palliative Care Screening Complete Not Applicable -  [employer] Complete Not Applicable -  Some recent data might be hidden

## 2021-04-10 NOTE — Discharge Summary (Addendum)
Physician Discharge Summary  Richard Zavala OZD:[id number] DOB: 1961/10/21 DOA: 04/02/2021  PCP: Midge Minium, PA  Admit date: 04/02/2021 Discharge date: 04/10/2021  Admitted From: Home Disposition:  SNF  Recommendations for Outpatient Follow-up:  -Patient to continue hemodialysis on Monday Wednesday Friday scheduled, next dialysis is tomorrow. -Follow with orthopedic regarding left ankle fracture Dr. Mack Guise in 1 week. - Would continue local wound care at discharge with mupirocin and foam bordered dressing of right lower extremity chronic ulcers. -Already established at [facility] wound care center, f/u with Dr Dellia Nims there  [place]: NO>SNF Equipment/Devices:SNF  Discharge Condition:Stable CODE STATUS:FULL Diet recommendation:  Carb Modified / Renal  Activity : nonweight bearing on left lower extremity  Brief/Interim Summary:  Diabetic right foot, with chronic wounds, with moderate surrounding cellulitis/infected ulcers. -With underlying peripheral vascular disease. -Vascular surgery input greatly appreciated, s/p angiography, and stent-balloon revascularization 4/19.. -Empirically on vancomycin and Zosyn, antibiotic management per ID, currently narrowed to doxycycline.  To finish another week as an outpatient. -Continue with local wound care.  Patient following with wound clinic as an outpatient. -Podiatry consulted input was appreciated ,  no plan for surgical intervention this admission.  Peripheral  vascular disease -This post stent and balloon angioplasty by vascular surgery, started on Plavix, continue with statin.  Type II diabetesmellitus with mild hyperglycemia. -Has been intermittently refusing insulin, noncompliant with diet as well, all CBG has been controlled, so his insulin was lowered and discharged 4 units of Lantus and insulin sliding scale.  End-stage renal disease on hemodialysis. Hyperkalemia -renal input Greatly appreciated, managed with  dialysis -Continue with dialysis on Monday Wednesday Friday schedule  Dyslipidemia. Continue statin  Essential hypertension. Continue Norvasc  GERD. Continue Pepcid  Coronary artery disease. -We will continue beta-blocker therapy, Imdur  as well as statin therapy as tolerated, now he was started on Plavix by vascular surgery, will stop his aspirin.  Patient with left ankle fracture status post intramedullary nail for left tibia shaft fracture on 03/21/2021 by Dr. Christia Reading, recommendation on that  discharge summary is for nonweight bearing, dressing and splint should remain until follow-up.  Discharge Diagnoses:  Active Problems:   Cellulitis in diabetic foot ([place])   Chronic ulcer of right foot with fat layer exposed [facility])    Discharge Instructions  Discharge Instructions    Diet - low sodium heart healthy   Complete by: As directed    Discharge instructions   Complete by: As directed    Follow with Primary MD Midge Minium, PA /SNF physician  Get CBC, CMP, 2 view Chest X ray checked  by Primary MD next visit.    Activity: Nonweight bearing on left lower extremity   Disposition SNF   Diet: Diet renal/carb modified with 1200 cc fluid restrictions   On your next visit with your primary care physician please Get Medicines reviewed and adjusted.   Please request your Prim.MD to go over all Hospital Tests and Procedure/Radiological results at the follow up, please get all Hospital records sent to your Prim MD by signing hospital release before you go home.   If you experience worsening of your admission symptoms, develop shortness of breath, life threatening emergency, suicidal or homicidal thoughts you must seek medical attention immediately by calling 911 or calling your MD immediately  if symptoms less severe.  You Must read complete instructions/literature along with all the possible adverse reactions/side effects for all the Medicines you take and that  have been prescribed to you. Take any new  Medicines after you have completely understood and accpet all the possible adverse reactions/side effects.   Do not drive, operating heavy machinery, perform activities at heights, swimming or participation in water activities or provide baby sitting services if your were admitted for syncope or siezures until you have seen by Primary MD or a Neurologist and advised to do so again.  Do not drive when taking Pain medications.    Do not take more than prescribed Pain, Sleep and Anxiety Medications  Special Instructions: If you have smoked or chewed Tobacco  in the last 2 yrs please stop smoking, stop any regular Alcohol  and or any Recreational drug use.  Wear Seat belts while driving.   Please note  You were cared for by a hospitalist during your hospital stay. If you have any questions about your discharge medications or the care you received while you were in the hospital after you are discharged, you can call the unit and asked to speak with the hospitalist on call if the hospitalist that took care of you is not available. Once you are discharged, your primary care physician will handle any further medical issues. Please note that NO REFILLS for any discharge medications will be authorized once you are discharged, as it is imperative that you return to your primary care physician (or establish a relationship with a primary care physician if you do not have one) for your aftercare needs so that they can reassess your need for medications and monitor your lab values.   Discharge wound care:   Complete by: As directed    -Would continue local wound care at discharge with mupirocin and foam bordered dressing   Increase activity slowly   Complete by: As directed    Increase activity slowly   Complete by: As directed    No wound care   Complete by: As directed      Allergies as of 04/10/2021      Reactions   Metformin Other (See Comments)   Severe  diarrhea    Penicillins Hives, Other (See Comments)   Has patient had a PCN reaction causing immediate rash, facial/tongue/throat swelling, SOB or lightheadedness with hypotension:Yes Has patient had a PCN reaction causing severe rash involving mucus membranes or skin necrosis:Yes Has patient had a PCN reaction that required hospitalization:inpatient at the time of reaction. Has patient had a PCN reaction occurring within the last 10 years:No If all of the above answers are "NO", then may proceed with Cephalosporin use.      Medication List    STOP taking these medications   aspirin EC 81 MG tablet     TAKE these medications   albuterol 108 (90 Base) MCG/ACT inhaler Commonly known as: VENTOLIN HFA Inhale 2 puffs into the lungs every 6 (six) hours as needed.   atorvastatin 80 MG tablet Commonly known as: Lipitor Take 1 tablet (80 mg total) by mouth at bedtime.   carvedilol 12.5 MG tablet Commonly known as: COREG Take 1 tablet (12.5 mg total) by mouth 2 (two) times daily.   clopidogrel 75 MG tablet Commonly known as: PLAVIX Take 1 tablet (75 mg total) by mouth daily. Start taking on: April 11, 2021   doxycycline 100 MG tablet Commonly known as: VIBRA-TABS Take 1 tablet (100 mg total) by mouth every 12 (twelve) hours for 7 days.   epoetin alfa 10000 UNIT/ML injection Commonly known as: EPOGEN Inject 0.8 mLs (8,000 Units total) into the vein Every Tuesday,Thursday,and Saturday with dialysis.  furosemide 80 MG tablet Commonly known as: LASIX Take 1 tablet (80 mg total) by mouth every Tuesday, Thursday, Saturday, and Sunday. Start taking on: April 12, 2021 What changed:   medication strength  how much to take  how to take this  when to take this  additional instructions   insulin aspart 100 UNIT/ML injection Commonly known as: novoLOG Inject 0-9 Units into the skin 3 (three) times daily with meals.   insulin glargine 100 UNIT/ML injection Commonly known as:  LANTUS Inject 0.04 mLs (4 Units total) into the skin daily. What changed: how much to take   lidocaine 5 % Commonly known as: LIDODERM Place 1 patch onto the skin daily.   oxyCODONE 5 MG immediate release tablet Commonly known as: Oxy IR/ROXICODONE Take 1 tablet (5 mg total) by mouth every 6 (six) hours as needed for severe pain (pain score 4-6). What changed:   when to take this  reasons to take this   pantoprazole 40 MG tablet Commonly known as: PROTONIX Take 40 mg by mouth 2 (two) times daily before a meal.            Discharge Care Instructions  (From admission, onward)         Start     Ordered   04/10/21 0000  Discharge wound care:       Comments: -Would continue local wound care at discharge with mupirocin and foam bordered dressing   04/10/21 1043          Contact information for follow-up providers    Schnier, Dolores Lory, MD. Go on 05/08/2021.   Specialties: Vascular Surgery, Cardiology, Radiology, Vascular Surgery Why: Can see Schnier or Arna Medici. Will need with ABI. 1pm Contact information: [place] [address] [phone]        Thornton Park, MD Follow up in 1 week(s).   Specialty: Orthopedic Surgery Contact information: [place] [address] [phone]            Contact information for after-discharge care    [facility] .   Service: Skilled Nursing Contact information: [place] [phone]                 Allergies  Allergen Reactions  . Metformin Other (See Comments)    Severe diarrhea   . Penicillins Hives and Other (See Comments)    Has patient had a PCN reaction causing immediate rash, facial/tongue/throat swelling, SOB or lightheadedness with hypotension:Yes Has patient had a PCN reaction causing severe rash involving mucus membranes or skin  necrosis:Yes Has patient had a PCN reaction that required hospitalization:inpatient at the time of reaction. Has patient had a PCN reaction occurring within the last 10 years:No If all of the above answers are "NO", then may proceed with Cephalosporin use.     Consultations:  ID, vascular surgery , nephrology ,podiatry     Procedures/Studies: DG Chest 1 View  Result Date: 03/20/2021 CLINICAL DATA:  Chest pain leg fracture EXAM: CHEST  1 VIEW COMPARISON:  02/12/2021 FINDINGS: Cardiomegaly with vascular congestion and mild interstitial edema. No consolidation or pneumothorax IMPRESSION: Cardiomegaly with vascular congestion and mild interstitial edema. Electronically Signed   By: Donavan Foil M.D.   On: 03/20/2021 19:43   DG Tibia/Fibula Left  Result Date: 03/21/2021 CLINICAL DATA:  Surgery.  Tibial fracture EXAM: DG C-ARM 1-60 MIN; LEFT TIBIA AND  FIBULA - 2 VIEW FLUOROSCOPY TIME:  Fluoroscopy Time:  1 minutes 59 seconds. Number of Acquired Spot Images: 6 COMPARISON:  March 20, 2021. FINDINGS: Six C-arm fluoroscopic images were obtained intraoperatively and submitted for post operative interpretation. These images demonstrate intramedullary rod and screw fixation of a spiral fracture of the distal tibial diaphysis. Improved, near anatomic alignment. Fibular neck fracture was better characterized on prior radiographs. Please see the performing provider's procedural report for further detail. IMPRESSION: Intraoperative fluoroscopy, as detailed above. Electronically Signed   By: Margaretha Sheffield MD   On: 03/21/2021 17:23   DG Tibia/Fibula Left  Result Date: 03/20/2021 CLINICAL DATA:  Fall with fracture on Sunday night.  Re-injured. EXAM: LEFT TIBIA AND FIBULA - 2 VIEW COMPARISON:  Radiograph 03/16/2021 FINDINGS: Displaced spiral fracture of the distal tibial shaft has increased displacement from prior exam. No evidence of intra-articular extension, no ankle joint effusion. There is both a healed  remote is well as acute fracture of the fibular neck. Acute component was not seen on prior. There is posterior splint material in place. Bones diffusely under mineralized. IMPRESSION: 1. Displaced spiral fracture of the distal tibial shaft, increased displacement from prior exam. 2. New acute fracture of the proximal fibular neck. This was not seen on prior exam, there is a chronic healed proximal fibular fracture. Electronically Signed   By: Keith Rake M.D.   On: 03/20/2021 19:26   DG Tibia/Fibula Left  Result Date: 03/16/2021 CLINICAL DATA:  Left lower leg injury. Slipped getting into bed today. EXAM: LEFT TIBIA AND FIBULA - 2 VIEW COMPARISON:  None. FINDINGS: Mildly displaced spiral fracture of the distal tibial metadiaphysis. Lateral displacement of 1-2 mm of the distal fracture fragment. No intra-articular component is visualized. Proximal fibular shaft fracture is remote. No acute fibular fracture. Knee and ankle alignment are maintained. The bones are diffusely under mineralized. There is soft tissue edema at the fracture site anteriorly, and to a lesser extent diffusely throughout the lower leg. IMPRESSION: 1. Mildly displaced spiral fracture of the distal tibial metadiaphysis. No intra-articular component visualized by radiograph. 2. Proximal fibular shaft fracture is healed/remote. Electronically Signed   By: Keith Rake M.D.   On: 03/16/2021 23:29   DG Abd 1 View  Result Date: 03/25/2021 CLINICAL DATA:  Abdominal pain EXAM: ABDOMEN - 1 VIEW COMPARISON:  October 27, 2012 FINDINGS: There is moderate stool in the colon. There is no bowel dilatation or air-fluid level to suggest bowel obstruction. No free air. There is extensive aortic and iliac artery vascular calcification. There is also calcification in multiple smaller arterial vessels. There is postoperative change in the proximal right femur. Bones are osteoporotic. IMPRESSION: Widespread arterial vascular calcification. No bowel  obstruction or free air evident on supine examination. Bones osteoporotic. Aortic Atherosclerosis (ICD10-I70.0). Electronically Signed   By: Lowella Grip III M.D.   On: 03/25/2021 16:00   PERIPHERAL VASCULAR CATHETERIZATION  Result Date: 04/08/2021 See op note  US Venous Img Lower Unilateral Right  Result Date: 04/02/2021 CLINICAL DATA:  Right lower extremity edema for 1 week EXAM: RIGHT LOWER EXTREMITY VENOUS DOPPLER ULTRASOUND TECHNIQUE: Gray-scale sonography with compression, as well as color and duplex ultrasound, were performed to evaluate the deep venous system(s) from the level of the common femoral vein through the popliteal and proximal calf veins. COMPARISON:  None. FINDINGS: VENOUS Normal compressibility of the common femoral, superficial femoral, and popliteal veins, as well as the visualized calf veins. Visualized portions of profunda femoral vein and great saphenous  vein unremarkable. No filling defects to suggest DVT on grayscale or color Doppler imaging. Doppler waveforms show normal direction of venous flow, normal respiratory plasticity and response to augmentation. Limited views of the contralateral common femoral vein are unremarkable. OTHER None. Limitations: none IMPRESSION: 1. No evidence of deep venous thrombosis within the right lower extremity. Electronically Signed   By: Randa Ngo M.D.   On: 04/02/2021 21:05   DG Tibia/Fibula Left Port  Result Date: 03/21/2021 CLINICAL DATA:  Post ORIF. EXAM: PORTABLE LEFT TIBIA AND FIBULA - 2 VIEW COMPARISON:  Intraoperative radiographs earlier the same date and 03/20/2021. FINDINGS: Left tibial intramedullary nail is secured by 2 proximal and 2 distal interlocking screws. The hardware appears well positioned. There is improved alignment of the spiral fracture of the mid to distal tibial diaphysis. Nondisplaced fracture of the proximal fibula appears unchanged. There is underlying posttraumatic deformity of the proximal fibular  diaphysis. No complications are identified. Diffuse vascular calcifications are noted. There are skin staples anterior to the patellar tendon. IMPRESSION: Improved alignment of the tibial fracture post ORIF. No demonstrated complication. Electronically Signed   By: Richardean Sale M.D.   On: 03/21/2021 17:29   DG Foot 2 Views Right  Result Date: 04/02/2021 CLINICAL DATA:  Right foot swelling greatest at first digit EXAM: RIGHT FOOT - 2 VIEW COMPARISON:  None. FINDINGS: Frontal and lateral views of the right foot are obtained. The bones are diffusely osteopenic. Prior healed second through fifth metatarsal fractures. No acute bony abnormalities. There is diffuse interphalangeal joint space narrowing, as well as prominent joint space narrowing and osteophyte formation of the first metatarsophalangeal joint, most consistent with osteoarthritis. Large inferior calcaneal spur. Ulceration dorsal aspect of the forefoot. Diffuse soft tissue swelling of the forefoot greatest at the first digit. IMPRESSION: 1. Forefoot soft tissue swelling and dorsal ulceration. 2. No acute or destructive bony lesions. 3. Multifocal osteoarthritis. 4. Osteopenia. Electronically Signed   By: Randa Ngo M.D.   On: 04/02/2021 19:04   DG C-Arm 1-60 Min  Result Date: 03/21/2021 CLINICAL DATA:  Surgery.  Tibial fracture EXAM: DG C-ARM 1-60 MIN; LEFT TIBIA AND FIBULA - 2 VIEW FLUOROSCOPY TIME:  Fluoroscopy Time:  1 minutes 59 seconds. Number of Acquired Spot Images: 6 COMPARISON:  March 20, 2021. FINDINGS: Six C-arm fluoroscopic images were obtained intraoperatively and submitted for post operative interpretation. These images demonstrate intramedullary rod and screw fixation of a spiral fracture of the distal tibial diaphysis. Improved, near anatomic alignment. Fibular neck fracture was better characterized on prior radiographs. Please see the performing provider's procedural report for further detail. IMPRESSION: Intraoperative  fluoroscopy, as detailed above. Electronically Signed   By: Margaretha Sheffield MD   On: 03/21/2021 17:23   ECHOCARDIOGRAM COMPLETE  Result Date: 03/21/2021    ECHOCARDIOGRAM REPORT   Patient Name:   ATOM SOLIVAN Date of Exam: 03/21/2021 Medical Rec #:  [id number]       Height:       68.0 in Accession #:    [id number]      Weight:       158.3 lb Date of Birth:  07-24-1961       BSA:          1.850 m Patient Age:    60 years        BP:           158/53 mmHg Patient Gender: M  HR:           62 bpm. Exam Location:  [facility] Procedure: 2D Echo, Color Doppler, Cardiac Doppler and Strain Analysis Indications:     Y81.44 CHF-Acute Systolic  History:         Patient has prior history of Echocardiogram examinations. CHF,                  CAD, TIA and CKD; Risk Factors:Hypertension and Dyslipidemia.  Sonographer:     Charmayne Sheer RDCS (AE) Referring Phys:  [facility] Diagnosing Phys: Isaias Cowman MD IMPRESSIONS  1. Left ventricular ejection fraction, by estimation, is 55 to 60%. The left ventricle has normal function. The left ventricle has no regional wall motion abnormalities. Left ventricular diastolic parameters were normal.  2. Right ventricular systolic function is normal. The right ventricular size is normal.  3. The mitral valve is normal in structure. Mild to moderate mitral valve regurgitation. No evidence of mitral stenosis.  4. Tricuspid valve regurgitation is mild to moderate. Mild tricuspid stenosis.  5. The aortic valve is normal in structure. Aortic valve regurgitation is not visualized. Mild to moderate aortic valve sclerosis/calcification is present, without any evidence of aortic stenosis.  6. The inferior vena cava is normal in size with greater than 50% respiratory variability, suggesting right atrial pressure of 3 mmHg. FINDINGS  Left Ventricle: Left ventricular ejection fraction, by estimation, is 55 to 60%. The left ventricle has normal function. The left ventricle has no  regional wall motion abnormalities. The left ventricular internal cavity size was normal in size. There is  no left ventricular hypertrophy. Left ventricular diastolic parameters were normal. Right Ventricle: The right ventricular size is normal. No increase in right ventricular wall thickness. Right ventricular systolic function is normal. Left Atrium: Left atrial size was normal in size. Right Atrium: Right atrial size was normal in size. Pericardium: There is no evidence of pericardial effusion. Mitral Valve: The mitral valve is normal in structure. Mild to moderate mitral valve regurgitation. No evidence of mitral valve stenosis. MV peak gradient, 3.8 mmHg. The mean mitral valve gradient is 2.0 mmHg. Tricuspid Valve: The tricuspid valve is normal in structure. Tricuspid valve regurgitation is mild to moderate. Mild tricuspid stenosis. Aortic Valve: The aortic valve is normal in structure. Aortic valve regurgitation is not visualized. Mild to moderate aortic valve sclerosis/calcification is present, without any evidence of aortic stenosis. Aortic valve mean gradient measures 5.0 mmHg. Aortic valve peak gradient measures 8.6 mmHg. Aortic valve area, by VTI measures 4.23 cm. Pulmonic Valve: The pulmonic valve was normal in structure. Pulmonic valve regurgitation is not visualized. No evidence of pulmonic stenosis. Aorta: The aortic root is normal in size and structure. Venous: The inferior vena cava is normal in size with greater than 50% respiratory variability, suggesting right atrial pressure of 3 mmHg. IAS/Shunts: No atrial level shunt detected by color flow Doppler.  LEFT VENTRICLE PLAX 2D LVIDd:         5.60 cm  Diastology LVIDs:         4.00 cm  LV e' medial:    3.81 cm/s LV PW:         1.60 cm  LV E/e' medial:  26.8 LV IVS:        1.30 cm  LV e' lateral:   6.64 cm/s LVOT diam:     2.50 cm  LV E/e' lateral: 15.4 LV SV:         120 LV SV Index:  65 LVOT Area:     4.91 cm  RIGHT VENTRICLE RV Basal diam:   3.80 cm LEFT ATRIUM             Index       RIGHT ATRIUM           Index LA diam:        4.70 cm 2.54 cm/m  RA Area:     24.00 cm LA Vol (A2C):   68.3 ml 36.91 ml/m RA Volume:   76.20 ml  41.18 ml/m LA Vol (A4C):   64.5 ml 34.86 ml/m LA Biplane Vol: 68.1 ml 36.80 ml/m  AORTIC VALVE                   PULMONIC VALVE AV Area (Vmax):    3.81 cm    PV Vmax:       1.00 m/s AV Area (Vmean):   3.65 cm    PV Vmean:      62.100 cm/s AV Area (VTI):     4.23 cm    PV VTI:        0.178 m AV Vmax:           147.00 cm/s PV Peak grad:  4.0 mmHg AV Vmean:          98.600 cm/s PV Mean grad:  2.0 mmHg AV VTI:            0.283 m AV Peak Grad:      8.6 mmHg AV Mean Grad:      5.0 mmHg LVOT Vmax:         114.00 cm/s LVOT Vmean:        73.400 cm/s LVOT VTI:          0.244 m LVOT/AV VTI ratio: 0.86  AORTA Ao Root diam: 3.60 cm MITRAL VALVE                 TRICUSPID VALVE MV Area (PHT): 4.15 cm      TR Peak grad:   34.3 mmHg MV Area VTI:   3.27 cm      TR Vmax:        293.00 cm/s MV Peak grad:  3.8 mmHg MV Mean grad:  2.0 mmHg      SHUNTS MV Vmax:       0.97 m/s      Systemic VTI:  0.24 m MV Vmean:      69.3 cm/s     Systemic Diam: 2.50 cm MV Decel Time: 183 msec MR Peak grad:    139.7 mmHg MR Mean grad:    81.0 mmHg MR Vmax:         591.00 cm/s MR Vmean:        420.0 cm/s MR PISA:         1.57 cm MR PISA Eff ROA: 8 mm MR PISA Radius:  0.50 cm MV E velocity: 102.00 cm/s MV A velocity: 108.00 cm/s MV E/A ratio:  0.94 Isaias Cowman MD Electronically signed by Isaias Cowman MD Signature Date/Time: 03/21/2021/11:50:59 AM    Final    (Echo, Carotid, EGD, Colonoscopy, ERCP)    Subjective:   Discharge Exam: Vitals:   04/10/21 0611 04/10/21 0846  BP: 138/64 (!) 153/108  Pulse: 63 67  Resp:  18  Temp:  98.7 F (37.1 C)  SpO2:  97%   Vitals:   04/09/21 2327 04/10/21 0418 04/10/21 0611 04/10/21 0846  BP: 136/76 (!) 166/76 138/64 (!) 153/108  Pulse: 69 66 63 67  Resp: 18 18  18   Temp: 98.7 F (37.1 C) (!)  97.5 F (36.4 C)  98.7 F (37.1 C)  TempSrc: Oral Oral  Oral  SpO2: 100% 92%  97%  Weight:      Height:        General: Pt is alert, awake, not in acute distress Cardiovascular: RRR, S1/S2 +, no rubs, no gallops Respiratory: CTA bilaterally, no wheezing, no rhonchi Abdominal: Soft, NT, ND, bowel sounds + Extremities: no edema, no cyanosis, left lower extremity in cast    The results of significant diagnostics from this hospitalization (including imaging, microbiology, ancillary and laboratory) are listed below for reference.     Microbiology: Recent Results (from the past 240 hour(s))  Culture, blood (routine x 2)     Status: None   Collection Time: 04/02/21  6:01 PM   Specimen: Left Antecubital; Blood  Result Value Ref Range Status   Specimen Description LEFT ANTECUBITAL  Final   Special Requests   Final    BOTTLES DRAWN AEROBIC AND ANAEROBIC Blood Culture results may not be optimal due to an excessive volume of blood received in culture bottles   Culture   Final    NO GROWTH 5 DAYS Performed at [facility], [address], [place], [place] [address]    Report Status 04/07/2021 FINAL  Final  Culture, blood (routine x 2)     Status: None   Collection Time: 04/02/21  8:15 PM   Specimen: BLOOD  Result Value Ref Range Status   Specimen Description BLOOD BLOOD LEFT HAND  Final   Special Requests   Final    BOTTLES DRAWN AEROBIC AND ANAEROBIC Blood Culture adequate volume   Culture   Final    NO GROWTH 5 DAYS Performed at [facility], [address], [place], [place] [address]    Report Status 04/07/2021 FINAL  Final  SARS CORONAVIRUS 2 (TAT 6-24 HRS) Nasopharyngeal Nasopharyngeal Swab     Status: None   Collection Time: 04/02/21  8:20 PM   Specimen: Nasopharyngeal Swab  Result Value Ref Range Status   SARS Coronavirus 2 NEGATIVE NEGATIVE Final    Comment: (NOTE) SARS-CoV-2 target nucleic acids are NOT DETECTED.  The SARS-CoV-2 RNA is generally  detectable in upper and lower respiratory specimens during the acute phase of infection. Negative results do not preclude SARS-CoV-2 infection, do not rule out co-infections with other pathogens, and should not be used as the sole basis for treatment or other patient management decisions. Negative results must be combined with clinical observations, patient history, and epidemiological information. The expected result is Negative.  Fact Sheet for Patients: SugarRoll.be  Fact Sheet for Healthcare Providers: https://www.woods-mathews.com/  This test is not yet approved or cleared by the [place] FDA and  has been authorized for detection and/or diagnosis of SARS-CoV-2 by FDA under an Emergency Use Authorization (EUA). This EUA will remain  in effect (meaning this test can be used) for the duration of the COVID-19 declaration under Se ction 564(b)(1) of the Act, 21 U.S.C. section 360bbb-3(b)(1), unless the authorization is terminated or revoked sooner.  Performed at [facility], [place] [address] [place], [place] [address]   Aerobic Culture w Gram Stain (superficial specimen)     Status: None   Collection Time: 04/03/21  2:34 PM   Specimen: Wound  Result Value Ref Range Status   Specimen Description   Final    WOUND Performed at [facility], [address], [place], [place] [address]    Special Requests   Final    LEFT FOOT Performed at [facility], [place], [place] [address]    Gram Stain   Final    RARE WBC PRESENT,BOTH PMN AND MONONUCLEAR RARE GRAM POSITIVE RODS    Culture   Final    RARE STAPHYLOCOCCUS AUREUS WITHIN MIXED ORGANISMS Performed at [facility], [place] [address], [place],  [address]    Report Status 04/07/2021 FINAL  Final   Organism ID, Bacteria STAPHYLOCOCCUS AUREUS  Final      Susceptibility   Staphylococcus aureus - MIC*    CIPROFLOXACIN <=0.5  SENSITIVE Sensitive     ERYTHROMYCIN >=8 RESISTANT Resistant     GENTAMICIN <=0.5 SENSITIVE Sensitive     OXACILLIN <=0.25 SENSITIVE Sensitive     TETRACYCLINE <=1 SENSITIVE Sensitive     VANCOMYCIN 1 SENSITIVE Sensitive     TRIMETH/SULFA <=10 SENSITIVE Sensitive     CLINDAMYCIN RESISTANT Resistant     RIFAMPIN <=0.5 SENSITIVE Sensitive     Inducible Clindamycin POSITIVE Resistant     * RARE STAPHYLOCOCCUS AUREUS  SARS CORONAVIRUS 2 (TAT 6-24 HRS) Nasopharyngeal Nasopharyngeal Swab     Status: None   Collection Time: 04/09/21  3:15 PM   Specimen: Nasopharyngeal Swab  Result Value Ref Range Status   SARS Coronavirus 2 NEGATIVE NEGATIVE Final    Comment: (NOTE) SARS-CoV-2 target nucleic acids are NOT DETECTED.  The SARS-CoV-2 RNA is generally detectable in upper and lower respiratory specimens during the acute phase of infection. Negative results do not preclude SARS-CoV-2 infection, do not rule out co-infections with other pathogens, and should not be used as the sole basis for treatment or other patient management decisions. Negative results must be combined with clinical observations, patient history, and epidemiological information. The expected result is Negative.  Fact Sheet for Patients: SugarRoll.be  Fact Sheet for Healthcare Providers: https://www.woods-mathews.com/  This test is not yet approved or cleared by the [place] FDA and  has been authorized for detection and/or diagnosis of SARS-CoV-2 by FDA under an Emergency Use Authorization (EUA). This EUA will remain  in effect (meaning this test can be used) for the duration of the COVID-19 declaration under Se ction 564(b)(1) of the Act, 21 U.S.C. section 360bbb-3(b)(1), unless the authorization is terminated or revoked sooner.  Performed at [facility], [place] [address], [place],  [address]      Labs: BNP (last 3 results) Recent Labs    03/20/21 1916   BNP >2,585.2*   Basic Metabolic Panel: Recent Labs  Lab 04/07/21 0525 04/08/21 0501 04/09/21 0453  NA 130*  --  135  K 6.0* 5.3* 5.2*  CL 93*  --  97*  CO2 21*  --  23  GLUCOSE 155*  --  50*  BUN 82*  --  69*  CREATININE 7.10*  --  6.62*  CALCIUM 8.4*  --  8.3*   Liver Function Tests: No results for input(s): AST, ALT, ALKPHOS, BILITOT, PROT, ALBUMIN in the last 168 hours. No results for input(s): LIPASE, AMYLASE in the last 168 hours. No results for input(s): AMMONIA in the last 168 hours. CBC: Recent Labs  Lab 04/07/21 0525  WBC 5.5  HGB 10.3*  HCT 31.6*  MCV 96.3  PLT 209   Cardiac Enzymes: No results for input(s): CKTOTAL, CKMB, CKMBINDEX, TROPONINI in the last 168 hours. BNP: Invalid input(s): POCBNP CBG: Recent Labs  Lab 04/09/21 0730 04/09/21 1324 04/09/21 1704 04/09/21 2057 04/10/21 0734  GLUCAP 86 93 162* 167* 180*   D-Dimer No results for input(s): DDIMER in the last 72 hours. Hgb A1c No results for input(s): HGBA1C in the last 72 hours. Lipid Profile No results for input(s): CHOL, HDL, LDLCALC, TRIG, CHOLHDL, LDLDIRECT in the last 72 hours. Thyroid function studies No results for input(s): TSH, T4TOTAL, T3FREE, THYROIDAB in the last 72 hours.  Invalid input(s): FREET3 Anemia work up No results for input(s): VITAMINB12, FOLATE, FERRITIN, TIBC, IRON, RETICCTPCT in the last 72 hours. Urinalysis    Component Value Date/Time   COLORURINE YELLOW (A) 02/12/2021 1006   APPEARANCEUR CLEAR (A) 02/12/2021 1006   APPEARANCEUR Turbid 10/27/2012 1056   LABSPEC 1.011 02/12/2021 1006   LABSPEC 1.017 10/27/2012 1056   PHURINE 8.0 02/12/2021 1006   GLUCOSEU NEGATIVE 02/12/2021 1006   GLUCOSEU >=500 10/27/2012 1056   HGBUR NEGATIVE 02/12/2021 1006   BILIRUBINUR NEGATIVE 02/12/2021 1006   BILIRUBINUR Negative 10/27/2012 [place] 02/12/2021 1006   PROTEINUR 100 (A) 02/12/2021 1006   NITRITE NEGATIVE 02/12/2021 1006   LEUKOCYTESUR  NEGATIVE 02/12/2021 1006   LEUKOCYTESUR Negative 10/27/2012 1056   Sepsis Labs Invalid input(s): PROCALCITONIN,  WBC,  LACTICIDVEN Microbiology Recent Results (from the past 240 hour(s))  Culture, blood (routine x 2)     Status: None   Collection Time: 04/02/21  6:01 PM   Specimen: Left Antecubital; Blood  Result Value Ref Range Status   Specimen Description LEFT ANTECUBITAL  Final   Special Requests   Final    BOTTLES DRAWN AEROBIC AND ANAEROBIC Blood Culture results may not be optimal due to an excessive volume of blood received in culture bottles   Culture   Final    NO GROWTH 5 DAYS Performed at [facility], [address], [place], [place] [address]    Report Status 04/07/2021 FINAL  Final  Culture, blood (routine x 2)     Status: None   Collection Time: 04/02/21  8:15 PM   Specimen: BLOOD  Result Value Ref Range Status   Specimen Description BLOOD BLOOD LEFT HAND  Final   Special Requests   Final    BOTTLES DRAWN AEROBIC AND ANAEROBIC Blood Culture adequate volume   Culture   Final    NO GROWTH 5 DAYS Performed at [facility], [address] [place], [place] [address]    Report Status 04/07/2021 FINAL  Final  SARS CORONAVIRUS 2 (TAT 6-24 HRS) Nasopharyngeal Nasopharyngeal Swab     Status: None   Collection Time: 04/02/21  8:20 PM   Specimen: Nasopharyngeal Swab  Result Value Ref Range Status   SARS Coronavirus 2 NEGATIVE NEGATIVE Final    Comment: (NOTE) SARS-CoV-2 target nucleic acids are NOT DETECTED.  The SARS-CoV-2 RNA is generally detectable in upper and lower respiratory specimens during the acute phase of infection. Negative results do not preclude SARS-CoV-2 infection, do not rule out co-infections with other pathogens, and should not be used as the sole basis for treatment or other patient management decisions. Negative results must be combined with clinical observations, patient history, and epidemiological information. The  expected result is Negative.  Fact Sheet for Patients: SugarRoll.be  Fact Sheet for Healthcare Providers: https://www.woods-mathews.com/  This test is not yet approved or cleared by the [place] FDA and  has been authorized for detection and/or diagnosis of SARS-CoV-2 by FDA under an Emergency Use Authorization (EUA). This EUA will remain  in  effect (meaning this test can be used) for the duration of the COVID-19 declaration under Se ction 564(b)(1) of the Act, 21 U.S.C. section 360bbb-3(b)(1), unless the authorization is terminated or revoked sooner.  Performed at [facility], [place] [address], [place], [place] [address]   Aerobic Culture w Gram Stain (superficial specimen)     Status: None   Collection Time: 04/03/21  2:34 PM   Specimen: Wound  Result Value Ref Range Status   Specimen Description   Final    WOUND Performed at [facility], [address], [place], [place] [address]    Special Requests   Final    LEFT FOOT Performed at [facility], [place], [place] [address]    Gram Stain   Final    RARE WBC PRESENT,BOTH PMN AND MONONUCLEAR RARE GRAM POSITIVE RODS    Culture   Final    RARE STAPHYLOCOCCUS AUREUS WITHIN MIXED ORGANISMS Performed at [facility], [place] [address] [place], [place] [address]    Report Status 04/07/2021 FINAL  Final   Organism ID, Bacteria STAPHYLOCOCCUS AUREUS  Final      Susceptibility   Staphylococcus aureus - MIC*    CIPROFLOXACIN <=0.5 SENSITIVE Sensitive     ERYTHROMYCIN >=8 RESISTANT Resistant     GENTAMICIN <=0.5 SENSITIVE Sensitive     OXACILLIN <=0.25 SENSITIVE Sensitive     TETRACYCLINE <=1 SENSITIVE Sensitive     VANCOMYCIN 1 SENSITIVE Sensitive     TRIMETH/SULFA <=10 SENSITIVE Sensitive     CLINDAMYCIN RESISTANT Resistant     RIFAMPIN <=0.5 SENSITIVE Sensitive     Inducible Clindamycin POSITIVE Resistant     * RARE STAPHYLOCOCCUS  AUREUS  SARS CORONAVIRUS 2 (TAT 6-24 HRS) Nasopharyngeal Nasopharyngeal Swab     Status: None   Collection Time: 04/09/21  3:15 PM   Specimen: Nasopharyngeal Swab  Result Value Ref Range Status   SARS Coronavirus 2 NEGATIVE NEGATIVE Final    Comment: (NOTE) SARS-CoV-2 target nucleic acids are NOT DETECTED.  The SARS-CoV-2 RNA is generally detectable in upper and lower respiratory specimens during the acute phase of infection. Negative results do not preclude SARS-CoV-2 infection, do not rule out co-infections with other pathogens, and should not be used as the sole basis for treatment or other patient management decisions. Negative results must be combined with clinical observations, patient history, and epidemiological information. The expected result is Negative.  Fact Sheet for Patients: SugarRoll.be  Fact Sheet for Healthcare Providers: https://www.woods-mathews.com/  This test is not yet approved or cleared by the [place] FDA and  has been authorized for detection and/or diagnosis of SARS-CoV-2 by FDA under an Emergency Use Authorization (EUA). This EUA will remain  in effect (meaning this test can be used) for the duration of the COVID-19 declaration under Se ction 564(b)(1) of the Act, 21 U.S.C. section 360bbb-3(b)(1), unless the authorization is terminated or revoked sooner.  Performed at [facility], [place] [address], [place], [place] [address]      Time coordinating discharge: Over 30 minutes  SIGNED:   Phillips Climes, MD  [facility] 04/10/2021, 11:16 AM Pager   If 7PM-7AM, please contact night-coverage [email or web address] Password [email or web address]

## 2021-04-14 DIAGNOSIS — M25572 Pain in left ankle and joints of left foot: Secondary | ICD-10-CM | POA: Insufficient documentation

## 2021-04-15 ENCOUNTER — Encounter: Payer: Medicare Other | Admitting: Physical Therapy

## 2021-04-17 ENCOUNTER — Encounter: Payer: Medicare Other | Admitting: Physical Therapy

## 2021-04-19 ENCOUNTER — Emergency Department: Payer: Medicare Other

## 2021-04-19 ENCOUNTER — Emergency Department
Admission: EM | Admit: 2021-04-19 | Discharge: 2021-04-20 | Disposition: A | Discharge status: Home / Self Care | Payer: Medicare Other | Attending: Emergency Medicine | Admitting: Emergency Medicine

## 2021-04-19 DIAGNOSIS — I132 Hypertensive heart and chronic kidney disease with heart failure and with stage 5 chronic kidney disease, or end stage renal disease: Secondary | ICD-10-CM | POA: Diagnosis not present

## 2021-04-19 DIAGNOSIS — E1122 Type 2 diabetes mellitus with diabetic chronic kidney disease: Secondary | ICD-10-CM | POA: Insufficient documentation

## 2021-04-19 DIAGNOSIS — I2510 Atherosclerotic heart disease of native coronary artery without angina pectoris: Secondary | ICD-10-CM | POA: Insufficient documentation

## 2021-04-19 DIAGNOSIS — Z87891 Personal history of nicotine dependence: Secondary | ICD-10-CM | POA: Insufficient documentation

## 2021-04-19 DIAGNOSIS — Z794 Long term (current) use of insulin: Secondary | ICD-10-CM | POA: Insufficient documentation

## 2021-04-19 DIAGNOSIS — Z8616 Personal history of COVID-19: Secondary | ICD-10-CM | POA: Diagnosis not present

## 2021-04-19 DIAGNOSIS — R600 Localized edema: Secondary | ICD-10-CM | POA: Insufficient documentation

## 2021-04-19 DIAGNOSIS — Z992 Dependence on renal dialysis: Secondary | ICD-10-CM | POA: Diagnosis not present

## 2021-04-19 DIAGNOSIS — R39198 Other difficulties with micturition: Secondary | ICD-10-CM | POA: Insufficient documentation

## 2021-04-19 DIAGNOSIS — Z7902 Long term (current) use of antithrombotics/antiplatelets: Secondary | ICD-10-CM | POA: Diagnosis not present

## 2021-04-19 DIAGNOSIS — I509 Heart failure, unspecified: Secondary | ICD-10-CM | POA: Insufficient documentation

## 2021-04-19 DIAGNOSIS — N186 End stage renal disease: Secondary | ICD-10-CM | POA: Diagnosis not present

## 2021-04-19 DIAGNOSIS — R609 Edema, unspecified: Secondary | ICD-10-CM

## 2021-04-19 NOTE — ED Notes (Addendum)
Urinal provided for urine specimen.

## 2021-04-19 NOTE — ED Provider Notes (Signed)
[facility] Emergency Department Provider Note   ____________________________________________   Event Date/Time   First MD Initiated Contact with Patient 04/19/21 2051     (approximate)  I have reviewed the triage vital signs and the nursing notes.   HISTORY  Chief Complaint Urinary Retention and Leg Swelling    HPI Richard Zavala is a 60 y.o. male with past medical history of hypertension, hyperlipidemia, diabetes, CAD, PAD, and ESRD on HD (MWF) who presents to the ED complaining of difficulty urinating and leg swelling.  Patient reports that he has not been making as much urine as he used to and that it seems to burn when he urinates.  He denies any fevers, abdominal pain, or flank pain.  He was concerned that he could be retaining urine earlier this evening and staff at [facility] attempted an in and out catheterization.  He states that they were unable to get urine but as soon as they remove the catheter he was able to urinate.  He is also concerned about swelling in both of his legs.  He feels like his left thigh and right calf are swollen.  He had surgery for a left fibula fracture earlier this month, states he has been working with physical therapy but they mention to him that his legs seem swollen.  He denies any pain in his lower extremities.  He was recently admitted for diabetic wound to his right foot, states this has been healing well.        Past Medical History:  Diagnosis Date  . Anemia   . Blind   . BPH (benign prostatic hyperplasia)   . CAD (coronary artery disease)   . CHF (congestive heart failure) ([place])   . Chronic kidney disease (CKD), stage IV (severe) (HCC)    DIALYSIS  . Diabetes mellitus without complication ([place])   . ESRD (end stage renal disease) ([place])    Monday-Wednesday-Friday dialysis  . Hyperlipemia   . Hypertension   . Neuropathy   . Osteoporosis   . Pulmonary HTN ([place])   . Stroke [facility])    2013  . TIA  (transient ischemic attack)   . TRD (traction retinal detachment)   . Wilms' tumor [facility])     Patient Active Problem List   Diagnosis Date Noted  . Chronic ulcer of right foot with fat layer exposed ([place])   . Cellulitis in diabetic foot ([place]) 04/02/2021  . Tibia/fibula fracture, left, closed, with delayed healing, subsequent encounter 03/20/2021  . Sepsis ([place]) 02/12/2021  . Acute metabolic encephalopathy 97/98/9211  . Acute respiratory failure with hypoxia ([place]) 02/12/2021  . Anemia in ESRD (end-stage renal disease) ([place]) 02/12/2021  . HLD (hyperlipidemia) 02/12/2021  . Stroke ([place]) 02/12/2021  . Hypothermia 02/12/2021  . CAP (community acquired pneumonia) 02/12/2021  . ESRD on hemodialysis ([place])   . Acute cystitis 02/06/2021  . Acute upper GI bleed 02/06/2021  . Acute blood loss anemia 02/06/2021  . COVID-19 virus infection 02/06/2021  . Gait instability 10/12/2019  . Pulmonary nodule 10/12/2019  . CAD (coronary artery disease), native coronary artery 03/08/2018  . Hyperlipidemia 03/08/2018  . Essential hypertension 03/08/2018  . Wilms' tumor with favorable history 03/01/2018  . Closed fracture of right distal radius 08/22/2016  . TIA (transient ischemic attack) 08/21/2016  . Encounter for pre-transplant evaluation for kidney transplant 05/17/2016  . Anemia due to other cause   . Hyperkalemia   . Hypoglycemia   . Pain   . End stage  renal disease ([place])   . Weakness   . Other chest pain   . Elevated troponin   . Hyperkalemia, diminished renal excretion 08/14/2015  . Hypertensive urgency 03/26/2015  . Anemia in CKD (chronic kidney disease) 12/04/2014  . Chronic kidney disease, stage IV (severe) ([place]) 12/03/2014  . Acute ischemic stroke ([place]) 08/25/2014  . Acute lacunar stroke ([place]) 06/25/2014  . Orthostatic hypotension 06/25/2014  . Diabetic vitreous hemorrhage associated with type 2 diabetes mellitus ([place]) 09/28/2013  . PDR (proliferative diabetic retinopathy) ([place])  09/28/2013  . History of Wilms' tumor 08/15/2013  . Iron deficiency anemia 07/21/2013  . Mesenteric artery stenosis ([place]) 03/27/2013  . Heart failure, chronic systolic ([place]) 14/43/1540  . Ischemic cardiomyopathy 03/13/2013  . Pulmonary HTN ([place]) 03/13/2013  . Peripheral edema 02/28/2013  . Diabetic macular edema of both eyes ([place]) 01/16/2013  . Corneal epithelial defect 10/06/2012  . TRD (traction retinal detachment) 09/22/2012  . Vitreous hemorrhage of both eyes ([place]) 07/21/2012  . Closed right hip fracture ([place]) 02/19/2012  . Diabetic sensorimotor polyneuropathy ([place]) 01/27/2012  . Vitamin D deficiency disease 11/30/2011  . Type II diabetes mellitus with renal manifestations ([place]) 07/03/2011  . Type 2 diabetes mellitus with kidney complication, with long-term current use of insulin ([place]) 07/03/2011  . Osteoporosis with fracture 05/28/2011    Past Surgical History:  Procedure Laterality Date  . A/V FISTULAGRAM Left 01/28/2017   Procedure: A/V Fistulagram;  Surgeon: Algernon Huxley, MD;  Location: [place] [facility];  Service: Cardiovascular;  Laterality: Left;  . A/V FISTULAGRAM Left 04/28/2018   Procedure: A/V FISTULAGRAM;  Surgeon: Algernon Huxley, MD;  Location: [place] [facility];  Service: Cardiovascular;  Laterality: Left;  . A/V FISTULAGRAM N/A 03/21/2020   Procedure: A/V FISTULAGRAM;  Surgeon: Algernon Huxley, MD;  Location: [place] [facility];  Service: Cardiovascular;  Laterality: N/A;  . AV FISTULA PLACEMENT    . AV FISTULA PLACEMENT Right 11/23/2019   Procedure: ARTERIOVENOUS (AV) FISTULA CREATION (RADIOCEPHALIC );  Surgeon: Algernon Huxley, MD;  Location: [facility] ORS;  Service: Vascular;  Laterality: Right;  . CARDIAC CATHETERIZATION    . CATARACT EXTRACTION W/PHACO Right 05/14/2016   Procedure: CATARACT EXTRACTION PHACO AND INTRAOCULAR LENS PLACEMENT (IOC);  Surgeon: Eulogio Bear, MD;  Location: [facility] ORS;  Service: Ophthalmology;  Laterality: Right;  [place] 1.46 AP% 11.5 CDE  12.33 FLUID PACK LOT # [id number]  . EYE SURGERY    . FRACTURE SURGERY     RIGHT LEG WITH ROD  . HIP SURGERY    . LOWER EXTREMITY ANGIOGRAPHY Right 04/08/2021   Procedure: Lower Extremity Angiography;  Surgeon: Katha Cabal, MD;  Location: [place] [facility];  Service: Cardiovascular;  Laterality: Right;  . NEPHRECTOMY RADICAL    . PERIPHERAL VASCULAR CATHETERIZATION N/A 08/28/2015   Procedure: A/V Shuntogram/Fistulagram;  Surgeon: Algernon Huxley, MD;  Location: [place] [facility];  Service: Cardiovascular;  Laterality: N/A;  . PERIPHERAL VASCULAR CATHETERIZATION Left 08/28/2015   Procedure: A/V Shunt Intervention;  Surgeon: Algernon Huxley, MD;  Location: [place] [facility];  Service: Cardiovascular;  Laterality: Left;  . TIBIA IM NAIL INSERTION Left 03/21/2021   Procedure: INTRAMEDULLARY (IM) NAIL TIBIAL;  Surgeon: Thornton Park, MD;  Location: [facility] ORS;  Service: Orthopedics;  Laterality: Left;  . UPPER EXTREMITY ANGIOGRAPHY Right 08/01/2020   Procedure: UPPER EXTREMITY ANGIOGRAPHY;  Surgeon: Algernon Huxley, MD;  Location: [place] [facility];  Service: Cardiovascular;  Laterality: Right;    Prior to  Admission medications   Medication Sig Start Date End Date Taking? Authorizing Provider  albuterol (VENTOLIN HFA) 108 (90 Base) MCG/ACT inhaler Inhale 2 puffs into the lungs every 6 (six) hours as needed. 02/21/21   [provider]  atorvastatin (LIPITOR) 80 MG tablet Take 1 tablet (80 mg total) by mouth at bedtime. 02/08/21   Enzo Bi, MD  carvedilol (COREG) 12.5 MG tablet Take 1 tablet (12.5 mg total) by mouth 2 (two) times daily. 02/16/21   Enzo Bi, MD  clopidogrel (PLAVIX) 75 MG tablet Take 1 tablet (75 mg total) by mouth daily. 04/11/21   Elgergawy, Silver Huguenin, MD  epoetin alfa (EPOGEN) 10000 UNIT/ML injection Inject 0.8 mLs (8,000 Units total) into the vein Every Tuesday,Thursday,and Saturday with dialysis. 02/16/21   Enzo Bi, MD  furosemide (LASIX) 80 MG tablet Take 1 tablet (80 mg  total) by mouth every Tuesday, Thursday, Saturday, and Sunday. 04/12/21   Elgergawy, Silver Huguenin, MD  insulin aspart (NOVOLOG) 100 UNIT/ML injection Inject 0-9 Units into the skin 3 (three) times daily with meals. 04/10/21   Elgergawy, Silver Huguenin, MD  insulin glargine (LANTUS) 100 UNIT/ML injection Inject 0.04 mLs (4 Units total) into the skin daily. 04/10/21   Elgergawy, Silver Huguenin, MD  lidocaine (LIDODERM) 5 % Place 1 patch onto the skin daily. 11/04/20   [provider]  oxyCODONE (OXY IR/ROXICODONE) 5 MG immediate release tablet Take 1 tablet (5 mg total) by mouth every 6 (six) hours as needed for severe pain (pain score 4-6). 04/10/21   Elgergawy, Silver Huguenin, MD  pantoprazole (PROTONIX) 40 MG tablet Take 40 mg by mouth 2 (two) times daily before a meal. 01/14/21   [provider]    Allergies Metformin and Penicillins  Family History  Problem Relation Age of Onset  . CAD Father   . COPD Father   . CAD Paternal Grandfather   . Diabetes Maternal Aunt   . Diabetes Maternal Uncle     Social History Social History   Tobacco Use  . Smoking status: Former Smoker    Packs/day: 1.00    Types: Cigarettes    Quit date: 06/26/2012    Years since quitting: 8.8  . Smokeless tobacco: Never Used  Vaping Use  . Vaping Use: Never used  Substance Use Topics  . Alcohol use: No  . Drug use: No    Review of Systems  Constitutional: No fever/chills Eyes: No visual changes. ENT: No sore throat. Cardiovascular: Denies chest pain. Respiratory: Denies shortness of breath. Gastrointestinal: No abdominal pain.  No nausea, no vomiting.  No diarrhea.  No constipation. Genitourinary: Positive for dysuria and difficulty urinating. Musculoskeletal: Negative for back pain.  Positive for leg swelling. Skin: Negative for rash. Neurological: Negative for headaches, focal weakness or numbness.  ____________________________________________   PHYSICAL EXAM:  VITAL SIGNS: ED Triage Vitals  Enc  Vitals Group     BP 04/19/21 2055 (!) 153/124     Pulse Rate 04/19/21 2055 68     Resp 04/19/21 2055 16     Temp 04/19/21 2055 98.4 F (36.9 C)     Temp Source 04/19/21 2055 Oral     SpO2 04/19/21 2049 98 %     Weight 04/19/21 2056 158 lb 11.7 oz (72 kg)     Height 04/19/21 2056 5\' 8"  (1.727 m)     Head Circumference --      Peak Flow --      Pain Score 04/19/21 2055 4  Pain Loc --      Pain Edu? --      Excl. in [place]? --     Constitutional: Alert and oriented. Eyes: Conjunctivae are normal. Head: Atraumatic. Nose: No congestion/rhinnorhea. Mouth/Throat: Mucous membranes are moist. Neck: Normal ROM Cardiovascular: Normal rate, regular rhythm. Grossly normal heart sounds.  Enlarged left upper extremity fistula without palpable thrill.  Right forearm fistula with palpable thrill. Respiratory: Normal respiratory effort.  No retractions. Lungs CTAB. Gastrointestinal: Soft and nontender. No distention. Genitourinary: deferred Musculoskeletal: Splint in place to left lower extremity with trace edema proximally.  1+ pitting edema on right extending to knee.  No associated tenderness bilaterally. Neurologic:  Normal speech and language. No gross focal neurologic deficits are appreciated. Skin:  Skin is warm, dry and intact. No rash noted.  Healing wound to right second toe with no erythema or warmth. Psychiatric: Mood and affect are normal. Speech and behavior are normal.  ____________________________________________   LABS (all labs ordered are listed, but only abnormal results are displayed)  Labs Reviewed  URINALYSIS, COMPLETE (UACMP) WITH MICROSCOPIC     PROCEDURES  Procedure(s) performed (including Critical Care):  Procedures   ____________________________________________   INITIAL IMPRESSION / ASSESSMENT AND PLAN / ED COURSE       60 year old male with past medical history of hypertension, hyperlipidemia, diabetes, CAD, PAD, and ESRD on HD (MWF) who presents to  the ED with difficulty urinating over the past 24 to 48 hours along with some dysuria.  He does not have any lower abdominal swelling or tenderness to raise suspicion for urinary retention.  We will screen bladder scan but I doubt significant urinary retention.  He does report some dysuria and we will check UA if he is able to provide a sample here in the ED.  Some pyuria and decreased urine production would not be unexpected in this dialysis patient.  With lower extremity swelling and recent orthopedic procedure, we will check ultrasound for DVT.  He is neurovascular intact to bilateral lower extremities and diabetic wound appears to be healing well on the right foot.  Lower extremity ultrasound is negative for DVT.  Patient has been unable to provide urine sample but I have low suspicion for UTI at this time.  Patient is appropriate for discharge back to nursing facility, was counseled to follow-up with his PCP and to return to the ED for new or worsening symptoms.  Patient agrees with plan.      ____________________________________________   FINAL CLINICAL IMPRESSION(S) / ED DIAGNOSES  Final diagnoses:  Peripheral edema  Difficulty urinating  ESRD (end stage renal disease) [facility])     ED Discharge Orders    None       Note:  This document was prepared using Dragon voice recognition software and may include unintentional dictation errors.   Blake Divine, MD 04/19/21 2326

## 2021-04-19 NOTE — ED Notes (Signed)
Ultrasound at bedside

## 2021-04-19 NOTE — ED Triage Notes (Signed)
EMS brought in from [facility] for urinary retention. EMS reports staff attempted to catheterize patient and were unable to obtain any urine output. Pt reports "after they catheterized me I peed". Patient did complete hemodialysis Friday and has been having problems urinating this week. Denies blood in urine/dysuria.   Pt also complaining of left thigh swelling since he had his dialysis treatment Wednesday. No redness noted to same. Pt is currently at SNF for rehab of left tibia surgery that was done 3 weeks ago. Currently taking Plavix.

## 2021-04-20 NOTE — ED Notes (Signed)
Request made for [facility] to transport to [facility].

## 2021-04-23 ENCOUNTER — Encounter: Payer: Self-pay | Admitting: Gastroenterology

## 2021-04-23 ENCOUNTER — Ambulatory Visit (INDEPENDENT_AMBULATORY_CARE_PROVIDER_SITE_OTHER): Payer: Medicare Other | Admitting: Gastroenterology

## 2021-04-23 ENCOUNTER — Other Ambulatory Visit: Payer: Self-pay

## 2021-04-23 VITALS — BP 179/84 | HR 66 | Temp 98.1°F

## 2021-04-23 DIAGNOSIS — Z86010 Personal history of colonic polyps: Secondary | ICD-10-CM

## 2021-04-23 DIAGNOSIS — D500 Iron deficiency anemia secondary to blood loss (chronic): Secondary | ICD-10-CM | POA: Diagnosis not present

## 2021-04-23 NOTE — Progress Notes (Signed)
Vonda Antigua, MD [address]  [place], [place] [address]  Main: [phone]  Fax: [phone]   Primary Care Physician: [facility]   Chief Complaint  Patient presents with  . Hospitalization Follow-up    HPI: Richard Zavala is a 60 y.o. male here for hospital follow-up for melena.  In February 2022 patient was admitted bleed melena and anemia.  Patient was found to be COVID-positive.  Pulmonary edema, ground glass airspace opacities bilaterally and endoscopy was not recommended, especially given recent endoscopic visualization in November 2021.  Patient has not had any further melena since discharge  Patient recently discharged from the hospital status post stent balloon revascularization due to peripheral vascular disease, cellulitis and infected ulcers in the right lower extremity  Previous history: Patient had a double-balloon enteroscopy at [facility] in November 2021 for iron deficiency anemia and jejunal polyp.  This showed pseudomelanosis duodenitis, 2 jejunal polyps, 15 mm and 6 mm in size that were removed.  Pathology showed hamartomatous polyp, negative for dysplasia, features of peutz zeghers polyp.  Esophagus was normal, stomach was normal.  GI consult in August 2021 at [facility], was done for chronic anemia.  This reports that patient has required intermittent PRBC transfusions and iron infusions with hemodialysis for recurrent anemia with no overt bleeding.  He underwent colonoscopy in 2021 with multiple polyps removed that showed tubular adenoma, hyperplastic and benign inflammatory polyps.  Due to up-to-date colonoscopy the plan was to proceed with small bowel enteroscopy which was done as above  Current Outpatient Medications  Medication Sig Dispense Refill  . albuterol (VENTOLIN HFA) 108 (90 Base) MCG/ACT inhaler Inhale 2 puffs into the lungs every 6 (six) hours as needed.    Marland Kitchen amLODipine (NORVASC) 10 MG tablet Take 1 tablet by mouth daily.     Marland Kitchen atorvastatin (LIPITOR) 80 MG tablet Take 1 tablet (80 mg total) by mouth at bedtime.    . carvedilol (COREG) 12.5 MG tablet Take 1 tablet (12.5 mg total) by mouth 2 (two) times daily.    . clopidogrel (PLAVIX) 75 MG tablet Take 1 tablet (75 mg total) by mouth daily.    Marland Kitchen epoetin alfa (EPOGEN) 10000 UNIT/ML injection Inject 0.8 mLs (8,000 Units total) into the vein Every Tuesday,Thursday,and Saturday with dialysis. 1 mL   . furosemide (LASIX) 80 MG tablet Take 1 tablet (80 mg total) by mouth every Tuesday, Thursday, Saturday, and Sunday. 30 tablet   . hydrALAZINE (APRESOLINE) 100 MG tablet Take 1 tablet by mouth 3 (three) times daily.    . insulin aspart (NOVOLOG) 100 UNIT/ML injection Inject 0-9 Units into the skin 3 (three) times daily with meals. 10 mL 11  . insulin glargine (LANTUS) 100 UNIT/ML injection Inject 0.04 mLs (4 Units total) into the skin daily. 10 mL 11  . isosorbide mononitrate (IMDUR) 30 MG 24 hr tablet Take 1 tablet by mouth daily.    Marland Kitchen lidocaine (LIDODERM) 5 % Place 1 patch onto the skin daily.    . naproxen sodium (ALEVE) 220 MG tablet Take 1 tablet by mouth 2 (two) times daily as needed.    Marland Kitchen oxyCODONE (OXY IR/ROXICODONE) 5 MG immediate release tablet Take 1 tablet (5 mg total) by mouth every 6 (six) hours as needed for severe pain (pain score 4-6). 12 tablet 0  . pantoprazole (PROTONIX) 40 MG tablet Take 40 mg by mouth 2 (two) times daily before a meal.    . RELION PEN NEEDLE 31G/8MM 31G X  8 MM MISC USE AS DIRECTED ONCE DAILY WITH LANTUS     No current facility-administered medications for this visit.    Allergies as of 04/23/2021 - Review Complete 04/23/2021  Allergen Reaction Noted  . Metformin Other (See Comments) 08/14/2015  . Penicillins Hives and Other (See Comments) 08/14/2015    ROS:  General: Negative for anorexia, weight loss, fever, chills, fatigue, weakness. ENT: Negative for hoarseness, difficulty swallowing , nasal congestion. CV: Negative for  chest pain, angina, palpitations, dyspnea on exertion, peripheral edema.  Respiratory: Negative for dyspnea at rest, dyspnea on exertion, cough, sputum, wheezing.  GI: See history of present illness. GU:  Negative for dysuria, hematuria, urinary incontinence, urinary frequency, nocturnal urination.  Endo: Negative for unusual weight change.    Physical Examination:   BP (!) 179/84   Pulse 66   Temp 98.1 F (36.7 C) (Oral)   General: Well-nourished, well-developed in no acute distress.  Eyes: No icterus. Conjunctivae pink. Mouth: Oropharyngeal mucosa moist and pink , no lesions erythema or exudate. Neck: Supple, Trachea midline Abdomen: Bowel sounds are normal, nontender, nondistended, no hepatosplenomegaly or masses, no abdominal bruits or hernia , no rebound or guarding.   Extremities: No lower extremity edema. No clubbing or deformities. Neuro: Alert and oriented x 3.  Grossly intact. Skin: Warm and dry, no jaundice.   Psych: Alert and cooperative, normal mood and affect.   Labs: CMP     Component Value Date/Time   NA 135 04/09/2021 0453   NA 141 10/28/2012 0543   K 5.2 (H) 04/09/2021 0453   K 5.2 (H) 10/28/2012 0543   CL 97 (L) 04/09/2021 0453   CL 111 (H) 10/28/2012 0543   CO2 23 04/09/2021 0453   CO2 22 10/28/2012 0543   GLUCOSE 50 (L) 04/09/2021 0453   GLUCOSE 151 (H) 10/28/2012 0543   BUN 69 (H) 04/09/2021 0453   BUN 27 (H) 10/28/2012 0543   CREATININE 6.62 (H) 04/09/2021 0453   CREATININE 1.52 (H) 10/28/2012 0543   CALCIUM 8.3 (L) 04/09/2021 0453   CALCIUM 8.0 (L) 10/28/2012 0543   PROT 6.3 (L) 04/02/2021 1801   PROT 4.7 (L) 10/28/2012 0543   ALBUMIN 2.6 (L) 04/02/2021 1801   ALBUMIN 2.1 (L) 10/28/2012 0543   AST 19 04/02/2021 1801   AST 20 10/28/2012 0543   ALT 16 04/02/2021 1801   ALT 19 10/28/2012 0543   ALKPHOS 119 04/02/2021 1801   ALKPHOS 75 10/28/2012 0543   BILITOT 0.8 04/02/2021 1801   BILITOT 0.2 10/28/2012 0543   GFRNONAA 9 (L) 04/09/2021  0453   GFRNONAA 53 (L) 10/28/2012 0543   GFRAA 10 (L) 03/07/2020 1931   GFRAA >60 10/28/2012 0543   Lab Results  Component Value Date   WBC 5.5 04/07/2021   HGB 10.3 (L) 04/07/2021   HCT 31.6 (L) 04/07/2021   MCV 96.3 04/07/2021   PLT 209 04/07/2021    Imaging Studies: DG Abd 1 View  Result Date: 03/25/2021 CLINICAL DATA:  Abdominal pain EXAM: ABDOMEN - 1 VIEW COMPARISON:  October 27, 2012 FINDINGS: There is moderate stool in the colon. There is no bowel dilatation or air-fluid level to suggest bowel obstruction. No free air. There is extensive aortic and iliac artery vascular calcification. There is also calcification in multiple smaller arterial vessels. There is postoperative change in the proximal right femur. Bones are osteoporotic. IMPRESSION: Widespread arterial vascular calcification. No bowel obstruction or free air evident on supine examination. Bones osteoporotic. Aortic Atherosclerosis (ICD10-I70.0). Electronically Signed  By: Lowella Grip III M.D.   On: 03/25/2021 16:00   PERIPHERAL VASCULAR CATHETERIZATION  Result Date: 04/08/2021 See op note  US Venous Img Lower Bilateral  Result Date: 04/19/2021 CLINICAL DATA:  Initial evaluation for acute lower extremity swelling. History of recent surgery. EXAM: BILATERAL LOWER EXTREMITY VENOUS DOPPLER ULTRASOUND TECHNIQUE: Gray-scale sonography with graded compression, as well as color Doppler and duplex ultrasound were performed to evaluate the lower extremity deep venous systems from the level of the common femoral vein and including the common femoral, femoral, profunda femoral, popliteal and calf veins including the posterior tibial, peroneal and gastrocnemius veins when visible. The superficial great saphenous vein was also interrogated. Spectral Doppler was utilized to evaluate flow at rest and with distal augmentation maneuvers in the common femoral, femoral and popliteal veins. COMPARISON:  None. FINDINGS: RIGHT LOWER  EXTREMITY Common Femoral Vein: No evidence of thrombus. Normal compressibility, respiratory phasicity and response to augmentation. Saphenofemoral Junction: No evidence of thrombus. Normal compressibility and flow on color Doppler imaging. Profunda Femoral Vein: No evidence of thrombus. Normal compressibility and flow on color Doppler imaging. Femoral Vein: No evidence of thrombus. Normal compressibility, respiratory phasicity and response to augmentation. Popliteal Vein: No evidence of thrombus. Normal compressibility, respiratory phasicity and response to augmentation. Calf Veins: No evidence of thrombus. Normal compressibility and flow on color Doppler imaging. Superficial Great Saphenous Vein: No evidence of thrombus. Normal compressibility. Venous Reflux:  None. Other Findings: Mild diffuse soft tissue/interstitial edema within the subcutaneous soft tissues of the right calf. LEFT LOWER EXTREMITY Common Femoral Vein: No evidence of thrombus. Normal compressibility, respiratory phasicity and response to augmentation. Saphenofemoral Junction: No evidence of thrombus. Normal compressibility and flow on color Doppler imaging. Profunda Femoral Vein: No evidence of thrombus. Normal compressibility and flow on color Doppler imaging. Femoral Vein: No evidence of thrombus. Normal compressibility, respiratory phasicity and response to augmentation. Popliteal Vein: No evidence of thrombus. Normal compressibility, respiratory phasicity and response to augmentation. Calf Veins: Veins of left calf not assessed due to overlying cast. Superficial Great Saphenous Vein: No evidence of thrombus. Normal compressibility. Venous Reflux:  None. Other Findings: 2.7 cm baker cyst present within the left popliteal fossa. Mild diffuse soft tissue/interstitial edema within the visualized distal left leg. IMPRESSION: 1. No evidence of deep venous thrombosis in either lower extremity. 2. 2.7 cm Baker cyst within the left popliteal fossa.  Electronically Signed   By: Jeannine Boga M.D.   On: 04/19/2021 22:54   US Venous Img Lower Unilateral Right  Result Date: 04/02/2021 CLINICAL DATA:  Right lower extremity edema for 1 week EXAM: RIGHT LOWER EXTREMITY VENOUS DOPPLER ULTRASOUND TECHNIQUE: Gray-scale sonography with compression, as well as color and duplex ultrasound, were performed to evaluate the deep venous system(s) from the level of the common femoral vein through the popliteal and proximal calf veins. COMPARISON:  None. FINDINGS: VENOUS Normal compressibility of the common femoral, superficial femoral, and popliteal veins, as well as the visualized calf veins. Visualized portions of profunda femoral vein and great saphenous vein unremarkable. No filling defects to suggest DVT on grayscale or color Doppler imaging. Doppler waveforms show normal direction of venous flow, normal respiratory plasticity and response to augmentation. Limited views of the contralateral common femoral vein are unremarkable. OTHER None. Limitations: none IMPRESSION: 1. No evidence of deep venous thrombosis within the right lower extremity. Electronically Signed   By: Randa Ngo M.D.   On: 04/02/2021 21:05   DG Foot 2 Views Right  Result Date: 04/02/2021  CLINICAL DATA:  Right foot swelling greatest at first digit EXAM: RIGHT FOOT - 2 VIEW COMPARISON:  None. FINDINGS: Frontal and lateral views of the right foot are obtained. The bones are diffusely osteopenic. Prior healed second through fifth metatarsal fractures. No acute bony abnormalities. There is diffuse interphalangeal joint space narrowing, as well as prominent joint space narrowing and osteophyte formation of the first metatarsophalangeal joint, most consistent with osteoarthritis. Large inferior calcaneal spur. Ulceration dorsal aspect of the forefoot. Diffuse soft tissue swelling of the forefoot greatest at the first digit. IMPRESSION: 1. Forefoot soft tissue swelling and dorsal ulceration. 2.  No acute or destructive bony lesions. 3. Multifocal osteoarthritis. 4. Osteopenia. Electronically Signed   By: Randa Ngo M.D.   On: 04/02/2021 19:04    Assessment and Plan:   Richard Zavala is a 59 y.o. y/o male here for hospital follow-up from February 2022 when he was admitted with melena, anemia, and COVID-positive, with previous history of chronic iron deficiency anemia, with extensive work-up at [facility] as detailed in HPI  Patient has not had any further melena since hospital discharge and hemoglobin has been stable  I would refer him back to [place] given extensive work-up done there for previous history of anemia, including removal of small bowel polyps very recently in November 2021  Patient does not have an appointment set up for follow-up of small bowel polyps removed, and given his recent melena and hospital admission, they may want to proceed with repeat endoscopy in the near future  If symptoms recur, patient advised to call us back and he verbalized understanding   Dr Vonda Antigua

## 2021-05-02 ENCOUNTER — Telehealth: Payer: Self-pay

## 2021-05-02 NOTE — Addendum Note (Signed)
Addended by: Wayna Chalet on: 05/02/2021 11:20 AM   Modules accepted: Orders

## 2021-05-02 NOTE — Telephone Encounter (Signed)
Referral to [facility] was faxed. They will contact the patient to schedule date and time.

## 2021-05-06 ENCOUNTER — Other Ambulatory Visit (INDEPENDENT_AMBULATORY_CARE_PROVIDER_SITE_OTHER): Payer: Self-pay | Admitting: Vascular Surgery

## 2021-05-06 DIAGNOSIS — I7025 Atherosclerosis of native arteries of other extremities with ulceration: Secondary | ICD-10-CM

## 2021-05-06 DIAGNOSIS — Z95820 Peripheral vascular angioplasty status with implants and grafts: Secondary | ICD-10-CM

## 2021-05-08 ENCOUNTER — Ambulatory Visit (INDEPENDENT_AMBULATORY_CARE_PROVIDER_SITE_OTHER): Payer: Medicare Other | Admitting: Nurse Practitioner

## 2021-05-08 ENCOUNTER — Encounter (INDEPENDENT_AMBULATORY_CARE_PROVIDER_SITE_OTHER): Payer: Medicare Other

## 2021-05-21 DEATH — deceased

## 2022-08-01 IMAGING — DX DG C-ARM 1-60 MIN
3 series · 3 of 3 positions shown · non-contrast
Comparison: March 20, 2021.

CLINICAL DATA: Surgery.  Tibial fracture

EXAM:
DG C-ARM 1-60 MIN; LEFT TIBIA AND FIBULA - 2 VIEW
FLUOROSCOPY TIME:  Fluoroscopy Time:  1 minutes 59 seconds.
Number of Acquired Spot Images: 6

[tibia ap]
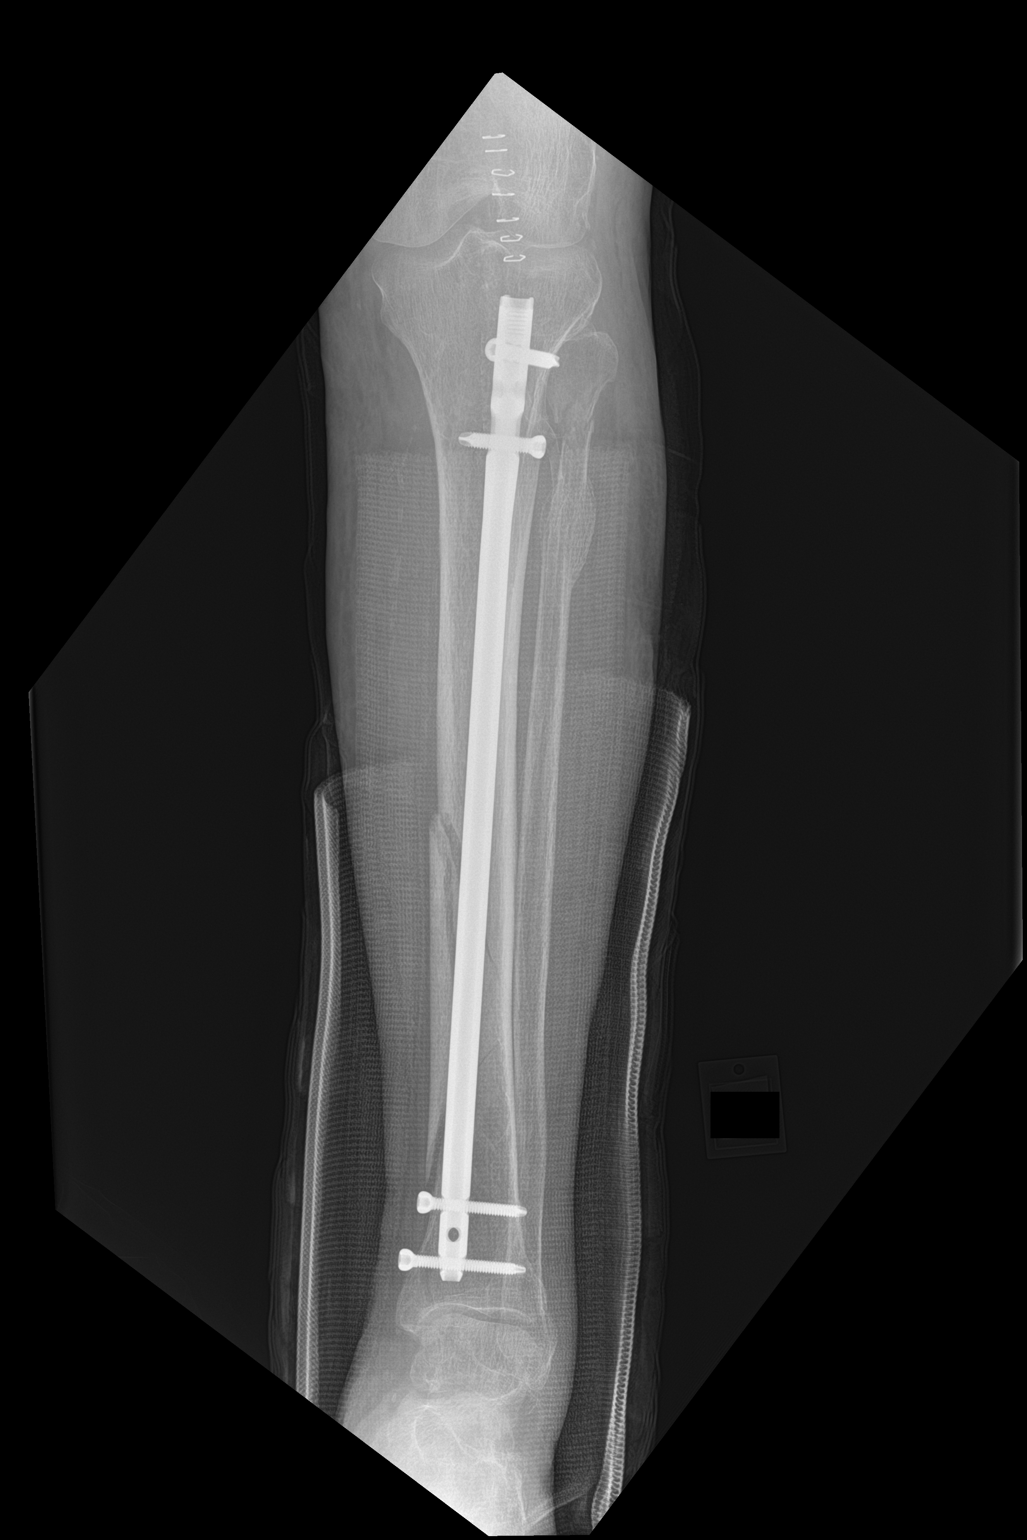

[tibia lat (1 of 2)]
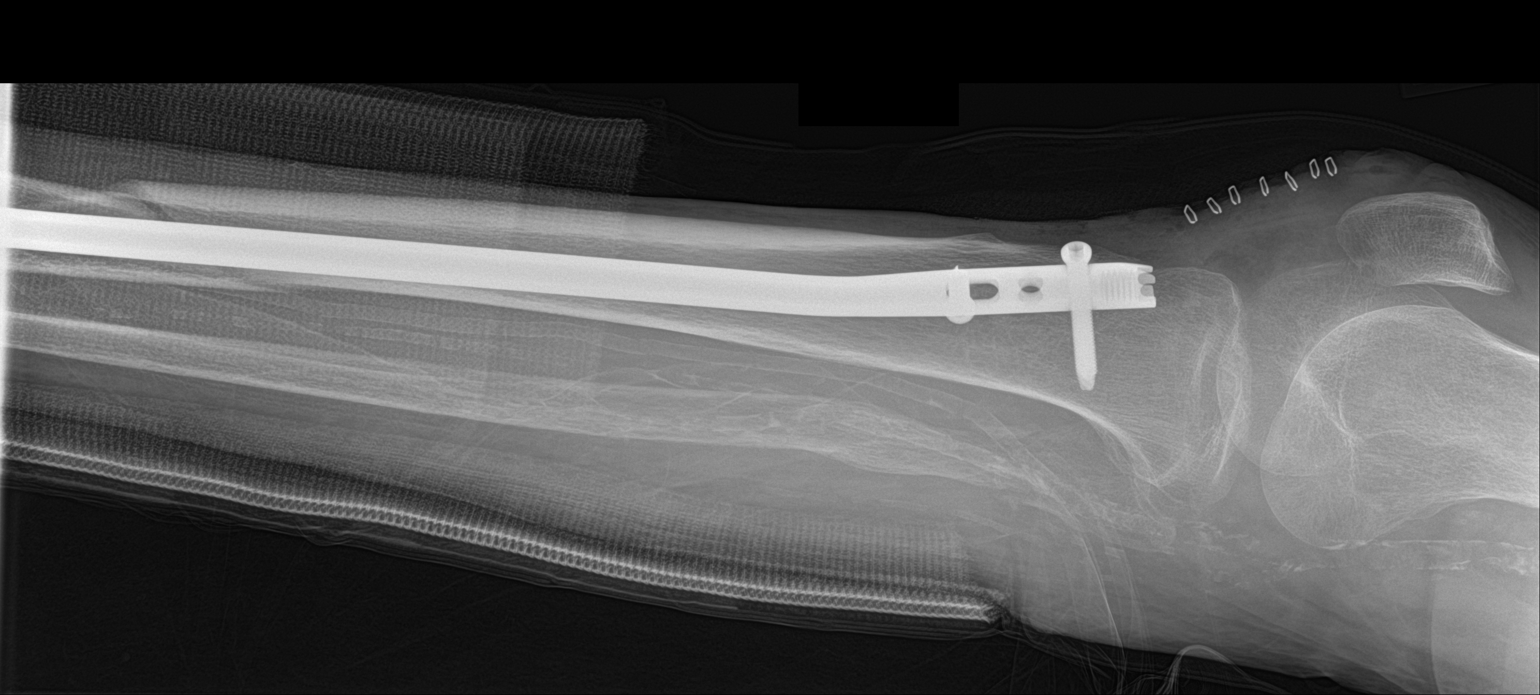

[tibia lat (2 of 2)]
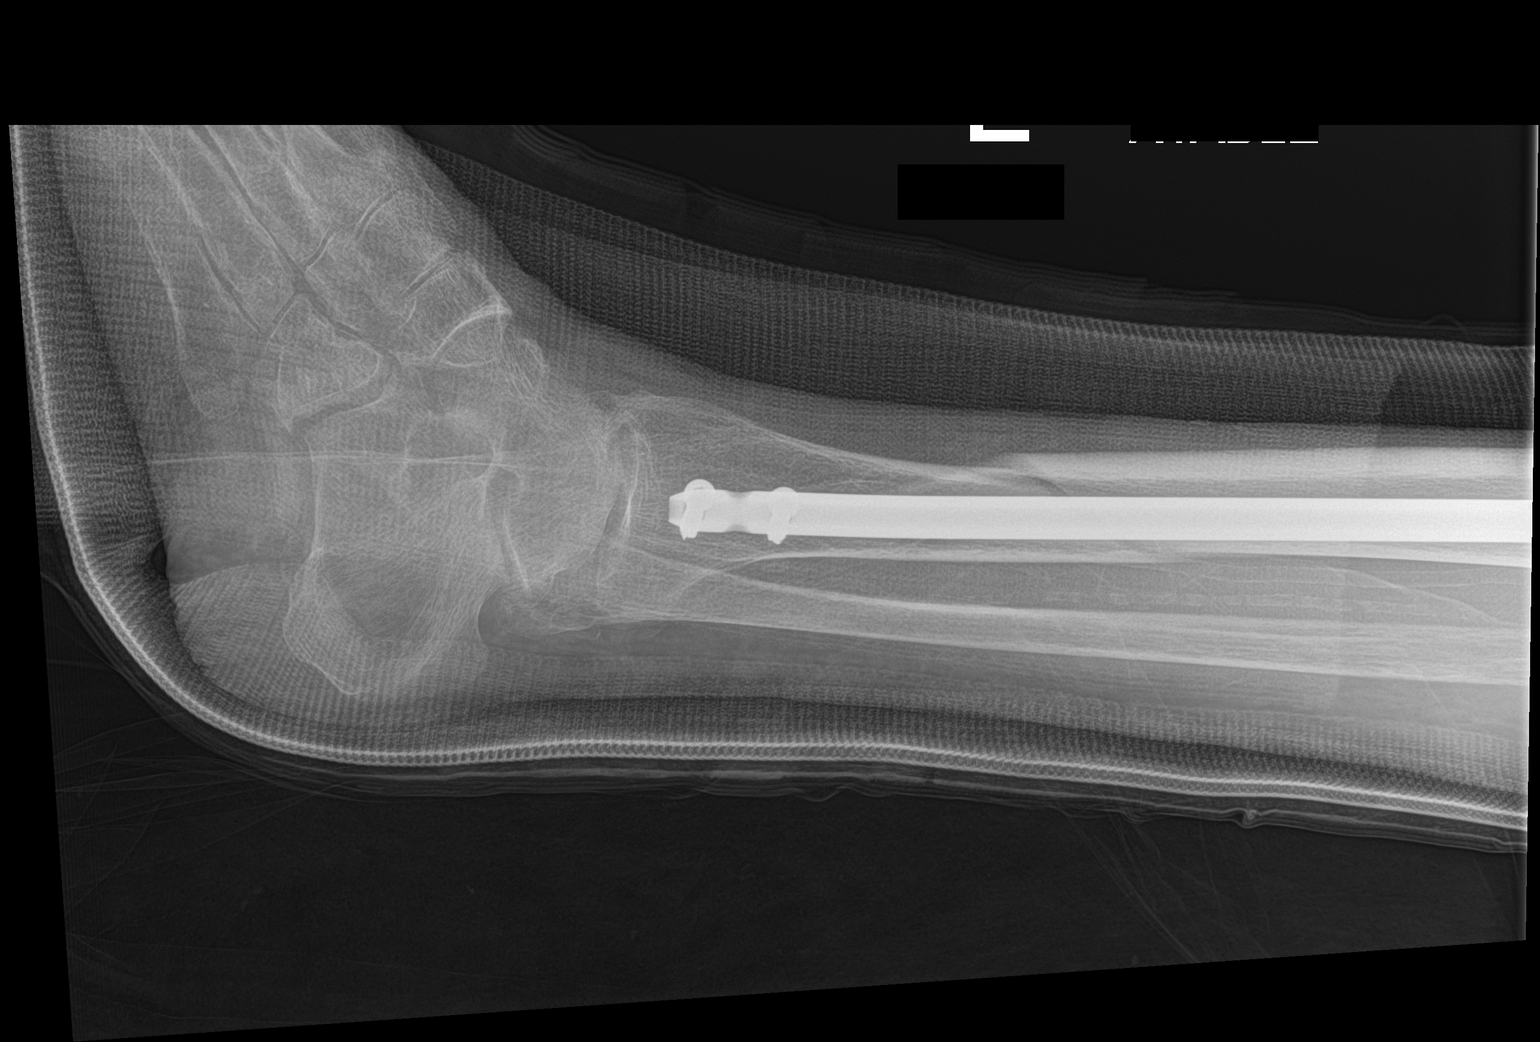

[3 of 3 positions shown; findings below may reference images not displayed]

FINDINGS: Six C-arm fluoroscopic images were obtained intraoperatively and
submitted for post operative interpretation. These images
demonstrate intramedullary rod and screw fixation of a spiral
fracture of the distal tibial diaphysis. Improved, near anatomic
alignment. Fibular neck fracture was better characterized on prior
radiographs. Please see the performing provider's procedural report
for further detail.
IMPRESSION: Intraoperative fluoroscopy, as detailed above.
